# Patient Record
Sex: Female | Born: 1935
Health system: Southern US, Community
[De-identification: ages and names within clinical notes are randomized; demographics above are authoritative.]

## PROBLEM LIST (undated history)

## (undated) DIAGNOSIS — G4733 Obstructive sleep apnea (adult) (pediatric): Secondary | ICD-10-CM

## (undated) DIAGNOSIS — Z87442 Personal history of urinary calculi: Secondary | ICD-10-CM

## (undated) DIAGNOSIS — M797 Fibromyalgia: Secondary | ICD-10-CM

## (undated) DIAGNOSIS — R51 Headache: Secondary | ICD-10-CM

## (undated) DIAGNOSIS — I350 Nonrheumatic aortic (valve) stenosis: Secondary | ICD-10-CM

## (undated) DIAGNOSIS — K649 Unspecified hemorrhoids: Secondary | ICD-10-CM

## (undated) DIAGNOSIS — G8929 Other chronic pain: Secondary | ICD-10-CM

## (undated) DIAGNOSIS — Z952 Presence of prosthetic heart valve: Secondary | ICD-10-CM

## (undated) DIAGNOSIS — M4800 Spinal stenosis, site unspecified: Secondary | ICD-10-CM

## (undated) DIAGNOSIS — M810 Age-related osteoporosis without current pathological fracture: Secondary | ICD-10-CM

## (undated) DIAGNOSIS — E785 Hyperlipidemia, unspecified: Secondary | ICD-10-CM

## (undated) DIAGNOSIS — K219 Gastro-esophageal reflux disease without esophagitis: Secondary | ICD-10-CM

## (undated) DIAGNOSIS — I10 Essential (primary) hypertension: Secondary | ICD-10-CM

## (undated) DIAGNOSIS — R519 Headache, unspecified: Secondary | ICD-10-CM

## (undated) HISTORY — DX: Unspecified hemorrhoids: K64.9

## (undated) HISTORY — DX: Other chronic pain: G89.29

## (undated) HISTORY — DX: Personal history of urinary calculi: Z87.442

## (undated) HISTORY — DX: Age-related osteoporosis without current pathological fracture: M81.0

## (undated) HISTORY — DX: Essential (primary) hypertension: I10

## (undated) HISTORY — DX: Gastro-esophageal reflux disease without esophagitis: K21.9

## (undated) HISTORY — DX: Headache: R51

## (undated) HISTORY — PX: CARDIAC CATHETERIZATION: SHX172

## (undated) HISTORY — DX: Fibromyalgia: M79.7

## (undated) HISTORY — PX: THYROIDECTOMY: SHX17

## (undated) HISTORY — PX: TONSILLECTOMY AND ADENOIDECTOMY: SUR1326

## (undated) HISTORY — DX: Hyperlipidemia, unspecified: E78.5

## (undated) HISTORY — DX: Headache, unspecified: R51.9

## (undated) HISTORY — PX: APPENDECTOMY: SHX54

## (undated) HISTORY — PX: TRANSTHORACIC ECHOCARDIOGRAM: SHX275

## (undated) HISTORY — PX: EYE SURGERY: SHX253

## (undated) HISTORY — PX: CATARACT EXTRACTION: SUR2

---

## 1988-04-13 HISTORY — PX: LAPAROSCOPIC CHOLECYSTECTOMY: SUR755

## 1998-08-23 ENCOUNTER — Other Ambulatory Visit: Admission: RE | Admit: 1998-08-23 | Discharge: 1998-08-23 | Payer: Self-pay | Admitting: Family Medicine

## 1999-11-11 ENCOUNTER — Ambulatory Visit (HOSPITAL_COMMUNITY): Admission: RE | Admit: 1999-11-11 | Discharge: 1999-11-12 | Payer: Self-pay | Admitting: Neurosurgery

## 1999-11-11 ENCOUNTER — Encounter: Payer: Self-pay | Admitting: Neurosurgery

## 1999-11-11 HISTORY — PX: ANTERIOR CERVICAL DECOMP/DISCECTOMY FUSION: SHX1161

## 1999-12-30 ENCOUNTER — Encounter: Payer: Self-pay | Admitting: Neurosurgery

## 1999-12-30 ENCOUNTER — Ambulatory Visit (HOSPITAL_COMMUNITY): Admission: RE | Admit: 1999-12-30 | Discharge: 1999-12-30 | Payer: Self-pay | Admitting: Neurosurgery

## 2000-11-02 ENCOUNTER — Other Ambulatory Visit: Admission: RE | Admit: 2000-11-02 | Discharge: 2000-11-02 | Payer: Self-pay | Admitting: Family Medicine

## 2001-01-25 ENCOUNTER — Encounter: Payer: Self-pay | Admitting: Obstetrics and Gynecology

## 2001-01-31 ENCOUNTER — Inpatient Hospital Stay (HOSPITAL_COMMUNITY): Admission: RE | Admit: 2001-01-31 | Discharge: 2001-02-02 | Payer: Self-pay | Admitting: Obstetrics and Gynecology

## 2001-01-31 ENCOUNTER — Encounter (INDEPENDENT_AMBULATORY_CARE_PROVIDER_SITE_OTHER): Payer: Self-pay | Admitting: Specialist

## 2001-01-31 HISTORY — PX: VAGINAL HYSTERECTOMY: SUR661

## 2002-01-27 ENCOUNTER — Encounter: Payer: Self-pay | Admitting: Internal Medicine

## 2002-04-19 ENCOUNTER — Encounter: Payer: Self-pay | Admitting: Family Medicine

## 2002-04-19 ENCOUNTER — Encounter: Admission: RE | Admit: 2002-04-19 | Discharge: 2002-04-19 | Payer: Self-pay | Admitting: Family Medicine

## 2002-09-06 ENCOUNTER — Other Ambulatory Visit: Admission: RE | Admit: 2002-09-06 | Discharge: 2002-09-06 | Payer: Self-pay | Admitting: Family Medicine

## 2002-09-20 ENCOUNTER — Observation Stay (HOSPITAL_COMMUNITY): Admission: RE | Admit: 2002-09-20 | Discharge: 2002-09-21 | Payer: Self-pay | Admitting: General Surgery

## 2003-01-11 ENCOUNTER — Encounter: Admission: RE | Admit: 2003-01-11 | Discharge: 2003-01-11 | Payer: Self-pay | Admitting: Family Medicine

## 2003-01-11 ENCOUNTER — Encounter: Payer: Self-pay | Admitting: Family Medicine

## 2003-05-13 ENCOUNTER — Ambulatory Visit (HOSPITAL_BASED_OUTPATIENT_CLINIC_OR_DEPARTMENT_OTHER): Admission: RE | Admit: 2003-05-13 | Discharge: 2003-05-13 | Payer: Self-pay | Admitting: *Deleted

## 2004-01-09 ENCOUNTER — Encounter: Admission: RE | Admit: 2004-01-09 | Discharge: 2004-01-09 | Payer: Self-pay | Admitting: Family Medicine

## 2004-04-28 ENCOUNTER — Ambulatory Visit (HOSPITAL_COMMUNITY): Admission: RE | Admit: 2004-04-28 | Discharge: 2004-04-28 | Payer: Self-pay | Admitting: Neurosurgery

## 2004-05-06 ENCOUNTER — Encounter: Admission: RE | Admit: 2004-05-06 | Discharge: 2004-08-04 | Payer: Self-pay | Admitting: Neurosurgery

## 2004-07-07 ENCOUNTER — Encounter
Admission: RE | Admit: 2004-07-07 | Discharge: 2004-10-05 | Payer: Self-pay | Admitting: Physical Medicine and Rehabilitation

## 2004-07-09 ENCOUNTER — Ambulatory Visit: Payer: Self-pay | Admitting: Physical Medicine and Rehabilitation

## 2004-07-24 ENCOUNTER — Ambulatory Visit: Payer: Self-pay | Admitting: Physical Medicine & Rehabilitation

## 2004-08-05 ENCOUNTER — Encounter: Admission: RE | Admit: 2004-08-05 | Discharge: 2004-08-14 | Payer: Self-pay | Admitting: Neurosurgery

## 2004-09-11 HISTORY — PX: UMBILICAL HERNIA REPAIR: SHX196

## 2004-09-25 ENCOUNTER — Ambulatory Visit: Payer: Self-pay | Admitting: Physical Medicine and Rehabilitation

## 2004-10-22 ENCOUNTER — Encounter
Admission: RE | Admit: 2004-10-22 | Discharge: 2005-01-20 | Payer: Self-pay | Admitting: Physical Medicine and Rehabilitation

## 2005-01-07 ENCOUNTER — Encounter: Admission: RE | Admit: 2005-01-07 | Discharge: 2005-01-07 | Payer: Self-pay | Admitting: Family Medicine

## 2005-06-25 ENCOUNTER — Other Ambulatory Visit: Admission: RE | Admit: 2005-06-25 | Discharge: 2005-06-25 | Payer: Self-pay | Admitting: Obstetrics and Gynecology

## 2006-04-20 ENCOUNTER — Encounter: Admission: RE | Admit: 2006-04-20 | Discharge: 2006-04-20 | Payer: Self-pay | Admitting: Family Medicine

## 2006-04-28 ENCOUNTER — Encounter: Admission: RE | Admit: 2006-04-28 | Discharge: 2006-04-28 | Payer: Self-pay | Admitting: Family Medicine

## 2006-10-08 ENCOUNTER — Ambulatory Visit: Payer: Self-pay | Admitting: Cardiovascular Disease

## 2006-10-08 ENCOUNTER — Inpatient Hospital Stay (HOSPITAL_COMMUNITY): Admission: EM | Admit: 2006-10-08 | Discharge: 2006-10-09 | Payer: Self-pay | Admitting: Emergency Medicine

## 2006-11-23 ENCOUNTER — Ambulatory Visit: Payer: Self-pay | Admitting: Cardiovascular Disease

## 2007-02-08 ENCOUNTER — Ambulatory Visit: Payer: Self-pay | Admitting: Internal Medicine

## 2007-02-17 ENCOUNTER — Encounter: Payer: Self-pay | Admitting: Internal Medicine

## 2007-02-17 ENCOUNTER — Ambulatory Visit: Payer: Self-pay | Admitting: Internal Medicine

## 2007-05-03 ENCOUNTER — Encounter: Admission: RE | Admit: 2007-05-03 | Discharge: 2007-05-03 | Payer: Self-pay | Admitting: Family Medicine

## 2007-05-24 ENCOUNTER — Ambulatory Visit: Payer: Self-pay

## 2007-05-24 ENCOUNTER — Encounter: Payer: Self-pay | Admitting: Cardiovascular Disease

## 2007-05-30 ENCOUNTER — Ambulatory Visit: Payer: Self-pay | Admitting: Cardiovascular Disease

## 2007-06-17 ENCOUNTER — Ambulatory Visit (HOSPITAL_COMMUNITY): Admission: RE | Admit: 2007-06-17 | Discharge: 2007-06-17 | Payer: Self-pay | Admitting: Family Medicine

## 2007-06-24 ENCOUNTER — Ambulatory Visit (HOSPITAL_COMMUNITY): Admission: RE | Admit: 2007-06-24 | Discharge: 2007-06-24 | Payer: Self-pay | Admitting: Family Medicine

## 2007-10-19 ENCOUNTER — Ambulatory Visit (HOSPITAL_COMMUNITY): Admission: RE | Admit: 2007-10-19 | Discharge: 2007-10-19 | Payer: Self-pay | Admitting: Family Medicine

## 2007-11-23 ENCOUNTER — Encounter: Admission: RE | Admit: 2007-11-23 | Discharge: 2007-11-23 | Payer: Self-pay | Admitting: Neurosurgery

## 2007-12-13 ENCOUNTER — Encounter: Admission: RE | Admit: 2007-12-13 | Discharge: 2007-12-13 | Payer: Self-pay | Admitting: Neurosurgery

## 2007-12-22 ENCOUNTER — Ambulatory Visit: Payer: Self-pay | Admitting: Cardiovascular Disease

## 2008-01-03 ENCOUNTER — Ambulatory Visit (HOSPITAL_COMMUNITY): Admission: RE | Admit: 2008-01-03 | Discharge: 2008-01-03 | Payer: Self-pay | Admitting: Neurosurgery

## 2008-02-14 ENCOUNTER — Ambulatory Visit (HOSPITAL_COMMUNITY): Admission: RE | Admit: 2008-02-14 | Discharge: 2008-02-14 | Payer: Self-pay | Admitting: Family Medicine

## 2008-02-14 ENCOUNTER — Encounter (INDEPENDENT_AMBULATORY_CARE_PROVIDER_SITE_OTHER): Payer: Self-pay | Admitting: *Deleted

## 2008-08-23 ENCOUNTER — Encounter: Admission: RE | Admit: 2008-08-23 | Discharge: 2008-08-23 | Payer: Self-pay | Admitting: Family Medicine

## 2008-08-28 DIAGNOSIS — R079 Chest pain, unspecified: Secondary | ICD-10-CM | POA: Insufficient documentation

## 2008-08-28 DIAGNOSIS — K219 Gastro-esophageal reflux disease without esophagitis: Secondary | ICD-10-CM

## 2008-08-28 DIAGNOSIS — I10 Essential (primary) hypertension: Secondary | ICD-10-CM | POA: Insufficient documentation

## 2008-08-28 DIAGNOSIS — R002 Palpitations: Secondary | ICD-10-CM | POA: Insufficient documentation

## 2008-08-28 DIAGNOSIS — E7800 Pure hypercholesterolemia, unspecified: Secondary | ICD-10-CM | POA: Insufficient documentation

## 2008-08-28 DIAGNOSIS — I359 Nonrheumatic aortic valve disorder, unspecified: Secondary | ICD-10-CM | POA: Insufficient documentation

## 2008-08-29 ENCOUNTER — Ambulatory Visit: Payer: Self-pay | Admitting: Cardiovascular Disease

## 2009-02-26 ENCOUNTER — Ambulatory Visit: Payer: Self-pay | Admitting: Cardiovascular Disease

## 2009-02-26 ENCOUNTER — Encounter: Payer: Self-pay | Admitting: Cardiovascular Disease

## 2009-02-26 ENCOUNTER — Ambulatory Visit (HOSPITAL_COMMUNITY): Admission: RE | Admit: 2009-02-26 | Discharge: 2009-02-26 | Payer: Self-pay | Admitting: Cardiovascular Disease

## 2009-12-10 ENCOUNTER — Encounter: Admission: RE | Admit: 2009-12-10 | Discharge: 2009-12-10 | Payer: Self-pay | Admitting: Family Medicine

## 2009-12-25 ENCOUNTER — Telehealth (INDEPENDENT_AMBULATORY_CARE_PROVIDER_SITE_OTHER): Payer: Self-pay | Admitting: *Deleted

## 2010-01-10 ENCOUNTER — Telehealth: Payer: Self-pay | Admitting: Internal Medicine

## 2010-01-13 ENCOUNTER — Ambulatory Visit: Payer: Self-pay | Admitting: Internal Medicine

## 2010-01-13 DIAGNOSIS — Z860100 Personal history of colon polyps, unspecified: Secondary | ICD-10-CM | POA: Insufficient documentation

## 2010-01-13 DIAGNOSIS — D649 Anemia, unspecified: Secondary | ICD-10-CM

## 2010-01-13 DIAGNOSIS — K5900 Constipation, unspecified: Secondary | ICD-10-CM | POA: Insufficient documentation

## 2010-01-13 DIAGNOSIS — K5730 Diverticulosis of large intestine without perforation or abscess without bleeding: Secondary | ICD-10-CM | POA: Insufficient documentation

## 2010-01-13 DIAGNOSIS — K648 Other hemorrhoids: Secondary | ICD-10-CM | POA: Insufficient documentation

## 2010-01-13 DIAGNOSIS — K625 Hemorrhage of anus and rectum: Secondary | ICD-10-CM | POA: Insufficient documentation

## 2010-01-13 DIAGNOSIS — K644 Residual hemorrhoidal skin tags: Secondary | ICD-10-CM | POA: Insufficient documentation

## 2010-01-13 DIAGNOSIS — IMO0001 Reserved for inherently not codable concepts without codable children: Secondary | ICD-10-CM | POA: Insufficient documentation

## 2010-01-13 DIAGNOSIS — E785 Hyperlipidemia, unspecified: Secondary | ICD-10-CM | POA: Insufficient documentation

## 2010-01-13 LAB — CONVERTED CEMR LAB
Basophils Absolute: 0 10*3/uL (ref 0.0–0.1)
Basophils Relative: 0.4 % (ref 0.0–3.0)
Eosinophils Absolute: 0.2 10*3/uL (ref 0.0–0.7)
Eosinophils Relative: 1.9 % (ref 0.0–5.0)
HCT: 40.3 % (ref 36.0–46.0)
Hemoglobin: 13.8 g/dL (ref 12.0–15.0)
Lymphs Abs: 2.6 10*3/uL (ref 0.7–4.0)
MCHC: 34.3 g/dL (ref 30.0–36.0)
MCV: 92.5 fL (ref 78.0–100.0)
Monocytes Absolute: 0.8 10*3/uL (ref 0.1–1.0)
Neutro Abs: 5.8 10*3/uL (ref 1.4–7.7)
WBC: 9.4 10*3/uL (ref 4.5–10.5)

## 2010-01-14 ENCOUNTER — Telehealth (INDEPENDENT_AMBULATORY_CARE_PROVIDER_SITE_OTHER): Payer: Self-pay | Admitting: *Deleted

## 2010-01-15 ENCOUNTER — Ambulatory Visit: Payer: Self-pay | Admitting: Internal Medicine

## 2010-01-16 ENCOUNTER — Encounter: Payer: Self-pay | Admitting: Internal Medicine

## 2010-02-24 ENCOUNTER — Ambulatory Visit: Payer: Self-pay | Admitting: Cardiovascular Disease

## 2010-04-13 HISTORY — PX: CATARACT EXTRACTION W/ INTRAOCULAR LENS  IMPLANT, BILATERAL: SHX1307

## 2010-05-04 ENCOUNTER — Encounter: Payer: Self-pay | Admitting: Family Medicine

## 2010-05-05 ENCOUNTER — Encounter: Payer: Self-pay | Admitting: Family Medicine

## 2010-05-13 NOTE — Letter (Signed)
Summary: Patient Notice- Polyp Results  [facility]  [address] [place], [place] [address]   Phone: [phone]  Fax: [phone]        January 16, 2010 MRN: [id number]    [name] [address] [place], [place]  [address]    Dear Ms. Hantz,  The polyp removed from your colon was adenomatous. This means that it was pre-cancerous or that  it had the potential to change into cancer over time.  I recommend that if your health status allows, you have a repeat colonoscopy in 5 years to determine if you have developed any new polyps over time and screen for colon cancer. If you develop any new rectal bleeding, abdominal pain or significant bowel habit changes, please contact us before then.   Please call us if you are having persistent problems or have questions about your condition that have not been fully answered at this time.  Sincerely,  Iva Boop MD, [facility]  This letter has been electronically signed by your physician.  Appended Document: Patient Notice- Polyp Results letter mailed

## 2010-05-13 NOTE — Progress Notes (Signed)
  Walk in Patient Form Recieved " Pt has Question's and Concerns" sent to Message Nurse [facility]  December 25, 2009 12:34 PM

## 2010-05-13 NOTE — Procedures (Signed)
Summary: Colonoscopy   Colonoscopy  Procedure date:  01/27/2002  Findings:      Location:  [facility].     Patient Name: [name], [name] MRN:  Procedure Procedures: Colonoscopy CPT: [phone].    with polypectomy. CPT: A3573898.  Personnel: Endoscopist: Iva Boop, MD, [facility].  Exam Location: Exam performed in Outpatient Clinic. Outpatient  Patient Consent: Procedure, Alternatives, Risks and Benefits discussed, consent obtained, from patient. Consent was obtained by the RN.  Indications Symptoms: Constipation  Average Risk Screening Routine.  History  Pre-Exam Physical: Performed Jan 27, 2002. Cardio-pulmonary exam abnormal. Rectal exam, HEENT exam , Mental status exam WNL. Abnormal PE findings include: 2/6 systolic murmur.  Exam Exam: Extent of exam reached: Cecum, extent intended: Cecum.  The cecum was identified by appendiceal orifice and IC valve. Patient position: on left side. Colon retroflexion performed. Images taken. ASA Classification: I. Tolerance: good.  Monitoring: Pulse and BP monitoring, Oximetry used. Supplemental O2 given.  Colon Prep Used Golytely for colon prep. Prep results: good.  Sedation Meds: Patient assessed and found to be appropriate for moderate (conscious) sedation. Fentanyl 25 mcg. given IV. Versed 6 mg. given IV.  Comments: Irritable bowel response to scope passage. Findings - NORMAL EXAM: Ascending Colon to Descending Colon.  POLYP: Ascending Colon, Maximum size: 4 mm. diminutive, sessile polyp. Procedure:  snare without cautery, removed, retrieved, Polyp sent to pathology. ICD9: Colon Polyps: 211.3.  NORMAL EXAM: Cecum.  DIVERTICULOSIS: Sigmoid Colon. Not bleeding. ICD9: Diverticulosis, Colon: 562.10.  HEMORRHOIDS: External. Size: Grade I. Not bleeding. Not thrombosed. ICD9: Hemorrhoids, External: 455.3.    Comments: No other abnormalities except those described above. Assessment Abnormal examination, see  findings above.  Diagnoses: 211.3: Colon Polyps.  562.10: Diverticulosis, Colon.  455.3: Hemorrhoids, External.   Events  Unplanned Interventions: No intervention was required.  Plans Medication Plan: Await pathology. Continue current medications.  Disposition: After procedure patient sent to recovery. After recovery patient sent home.  Scheduling/Referral: Await pathology to schedule patient.  Comments: If polyp is precancerous (adenomatous) will repeat colonoscopy in 5 years. If hyperplastic, revert to normal screeninf (colonoscopy in 10 years, yearly hemoccults starting in 5 years).   CC: [name]  This report was created from the original endoscopy report, which was reviewed and signed by the above listed endoscopist.

## 2010-05-13 NOTE — Procedures (Signed)
Summary: Endoscopy   EGD  Procedure date:  01/27/2002  Findings:      Location: [facility]     Patient Name: [name], [name] MRN:  Procedure Procedures: Panendoscopy (EGD) CPT: 43235.  Personnel: Endoscopist: Iva Boop, MD, [facility].  Exam Location: Exam performed in Outpatient Clinic. Outpatient  Patient Consent: Procedure, Alternatives, Risks and Benefits discussed, consent obtained, from patient. Consent was obtained by the RN.  Indications Symptoms: Abdominal pain, location: RUQ. Reflux symptoms  Comments: Epigastric pain also. History  Pre-Exam Physical: Performed Jan 27, 2002  Cardio-pulmonary exam abnormal. HEENT exam, Mental status exam WNL. Abnormal PE findings include: 2/6 systolic murmur.  Exam Exam Info: Maximum depth of insertion Duodenum, intended Duodenum. Vocal cords visualized. Gastric retroflexion performed. ASA Classification: I. Tolerance: good.  Sedation Meds: Patient assessed and found to be appropriate for moderate (conscious) sedation. Fentanyl 75 mcg. given IV. Versed 6 mg. given IV. Cetacaine Spray 2 sprays given aerosolized.  Monitoring: BP and pulse monitoring done. Oximetry used. Supplemental O2 given  Fluoroscopy: Fluoroscopy was not used.  Findings - Normal: Proximal Esophagus to Body.  - Normal: Duodenal Bulb to Duodenal 2nd Portion.  MUCOSAL ABNORMALITY: Antrum. Mottled mucosa. Biopsy/Mucosal Abn taken. ICD9: Gastritis, Unspecified: 535.50.   Assessment Abnormal examination, see findings above.  Diagnoses: 535.50: Gastritis, Unspecified.   Events  Unplanned Intervention: No unplanned interventions were required.  Plans Medication(s): Await pathology.  Comments: Increase Prilosec to 20 mg/day (OTC). Further recommendations pending biopsy, ? needs anticholinergic. Disposition: After procedure patient sent to recovery. After recovery patient sent home.  Scheduling: Await pathology to schedule patient.     CC: [name]  This report was created from the original endoscopy report, which was reviewed and signed by the above listed endoscopist.

## 2010-05-13 NOTE — Letter (Signed)
Summary: [facility] Instructions  [facility]  [address] [place], [place] [address]   Phone: [phone]  Fax: [phone]       [name]    [birth year]    MRN: [id number]        Procedure Day /Date:01-15-10     Arrival Time:10:30 AM      Procedure Time: 11:30 AM     Location of Procedure:                    X    [facility] (4th Floor)  PREPARATION FOR COLONOSCOPY WITH MOVIPREP   Today  do not eat nuts, seeds, popcorn, corn, beans, peas,  salads, or any raw vegetables.  Do not take any fiber supplements (e.g. Metamucil, Citrucel, and Benefiber).  THE DAY BEFORE YOUR PROCEDURE         DATE: 01-14-10  DAY: Tuesday  1.  Drink clear liquids the entire day-NO SOLID FOOD  2.  Do not drink anything colored red or purple.  Avoid juices with pulp.  No orange juice.  3.  Drink at least 64 oz. (8 glasses) of fluid/clear liquids during the day to prevent dehydration and help the prep work efficiently.  CLEAR LIQUIDS INCLUDE: Water Jello Ice Popsicles Tea (sugar ok, no milk/cream) Powdered fruit flavored drinks Coffee (sugar ok, no milk/cream) Gatorade Juice: apple, white grape, white cranberry  Lemonade Clear bullion, consomm, broth Carbonated beverages (any kind) Strained chicken noodle soup Hard Candy                             4.  In the morning, mix first dose of MoviPrep solution:    Empty 1 Pouch A and 1 Pouch B into the disposable container    Add lukewarm drinking water to the top line of the container. Mix to dissolve    Refrigerate (mixed solution should be used within 24 hrs)  5.  Begin drinking the prep at 5:00 p.m. The MoviPrep container is divided by 4 marks.   Every 15 minutes drink the solution down to the next mark (approximately 8 oz) until the full liter is complete.   6.  Follow completed prep with 16 oz of clear liquid of your choice (Nothing red or purple).  Continue to drink clear liquids until bedtime.  7.  Before  going to bed, mix second dose of MoviPrep solution:    Empty 1 Pouch A and 1 Pouch B into the disposable container    Add lukewarm drinking water to the top line of the container. Mix to dissolve    Refrigerate  THE DAY OF YOUR PROCEDURE      DATE: 01-15-10 DAY: Wednesday  Beginning at 6:30 Am (5 hours before procedure):         1. Every 15 minutes, drink the solution down to the next mark (approx 8 oz) until the full liter is complete.  2. Follow completed prep with 16 oz. of clear liquid of your choice.    3. You may drink clear liquids until 9:30 AM (2 HOURS BEFORE PROCEDURE).   MEDICATION INSTRUCTIONS  Unless otherwise instructed, you should take regular prescription medications with a small sip of water   as early as possible the morning of your procedure.      OTHER INSTRUCTIONS  You will need a responsible adult at least 73 years of age to accompany you and drive you home.  This person must remain in the waiting room during your procedure.  Wear loose fitting clothing that is easily removed.  Leave jewelry and other valuables at home.  However, you may wish to bring a book to read or  an iPod/MP3 player to listen to music as you wait for your procedure to start.  Remove all body piercing jewelry and leave at home.  Total time from sign-in until discharge is approximately 2-3 hours.  You should go home directly after your procedure and rest.  You can resume normal activities the  day after your procedure.  The day of your procedure you should not:   Drive   Make legal decisions   Operate machinery   Drink alcohol   Return to work  You will receive specific instructions about eating, activities and medications before you leave.    The above instructions have been reviewed and explained to me by   _______________________    I fully understand and can verbalize these instructions _____________________________ Date _________

## 2010-05-13 NOTE — Procedures (Signed)
Summary: Colonoscopy  Patient: [name] Note: All result statuses are Final unless otherwise noted.  Tests: (1) Colonoscopy (COL)   COL Colonoscopy           DONE     [facility]     [address]     [place], [place]  [address]           COLONOSCOPY PROCEDURE REPORT           PATIENT:  [name], [name]  MR#:  [id number]     BIRTHDATE:  17-Feb-1936, 74 yrs. old  GENDER:  female     ENDOSCOPIST:  Iva Boop, MD, [facility]           PROCEDURE DATE:  01/15/2010     PROCEDURE:  Colonoscopy with biopsy     ASA CLASS:  Class II     INDICATIONS:  rectal bleeding, history of pre-cancerous     (adenomatous) colon polyps diminutive adenoma 2008     MEDICATIONS:   Fentanyl 75 mcg IV, Versed 8 mg IV           DESCRIPTION OF PROCEDURE:   After the risks benefits and     alternatives of the procedure were thoroughly explained, informed     consent was obtained.  Digital rectal exam was performed and     revealed no abnormalities.   The LB [id number] endoscope     was introduced through the anus and advanced to the cecum, which     was identified by both the appendix and ileocecal valve, without     limitations.  The quality of the prep was excellent, using     MoviPrep.  The instrument was then slowly withdrawn as the colon     was fully examined.     <<PROCEDUREIMAGES>>           FINDINGS:  Strong IBS response to scope insertion. A diminutive     polyp was found in the cecum. It was 2 mm in size. The polyp was     removed using cold biopsy forceps.  Moderate diverticulosis was     found in the sigmoid colon.  Melanosis coli was found throughout     the colon.  This was otherwise a normal examination of the colon.     Retroflexed views in the rectum revealed internal hemorrhoids.     The scope was then withdrawn from the patient and the procedure     completed.           COMPLICATIONS:  None     ENDOSCOPIC IMPRESSION:     1) 2 mm diminutive polyp - removed     2) Moderate  diverticulosis in the sigmoid colon     3) Melanosis throughout the colon     4) Otherwise normal examination     5) Internal hemorrhoids     6) Prior diminutive adenoma 2008     RECOMMENDATIONS:     Surgical consult re: hemorrhoids.     Note that repeat Hgb 13 NORMAL     She should try daily MiraLax for constipation     consider deep sedation in future due to strong IBS response to     scope though appears to have had appropriate amnestic response to     Versed.     REPEAT EXAM:  In for Colonoscopy, pending biopsy results.           Iva Boop, MD, [facility]  CC:  Rudi Heap, MD     The Patient           n.     eSIGNED:   Iva Boop at 01/15/2010 01:25 PM           [name], [id number]  Note: An exclamation mark (!) indicates a result that was not dispersed into the flowsheet. Document Creation Date: 01/15/2010 1:25 PM _______________________________________________________________________  (1) Order result status: Final Collection or observation date-time: 01/15/2010 13:18 Requested date-time:  Receipt date-time:  Reported date-time:  Referring Physician:   Ordering Physician: Stan Head [phone]) Specimen Source:  Source: Launa Grill Order Number: [id number] Lab site:   Appended Document: Colonoscopy     Procedures Next Due Date:    Colonoscopy: 01/2015

## 2010-05-13 NOTE — Progress Notes (Signed)
Summary: triage  Phone Note From Other Clinic Call back at [phone]   Caller: Eunice Blase, ref coor Call For: Dr. Leone Payor Reason for Call: Schedule Patient Appt Summary of Call: Dr. Rudi Heap would like pt to be seen for rectal bleeding... Debbie said next NP3 was too long of a wait, November Initial call taken by: Vallarie Mare,  January 10, 2010 10:53 AM  Follow-up for Phone Call        Message left for Debbie: Pt. will see Amy Esterwood PAC on 01-13-10 at 2:30pm, please advise pt. of [email or web address]. Debbie to fax records to [facility].  Follow-up by: Laureen Ochs LPN,  January 10, 2010 11:31 AM

## 2010-05-13 NOTE — Procedures (Signed)
Summary: Colonoscopy   Colonoscopy  Procedure date:  02/17/2007  Findings:      Location:  [facility].    Patient Name: [name], [name] MRN:  Procedure Procedures: Colonoscopy CPT: [phone].    with polypectomy. CPT: [id number].  Personnel: Endoscopist: Iva Boop, MD, [facility].  Exam Location: Exam performed in Outpatient Clinic. Outpatient  Patient Consent: Procedure, Alternatives, Risks and Benefits discussed, consent obtained, from patient. Consent was obtained by the RN.  Indications  Surveillance of: Adenomatous Polyp(s). This is an initial surveillance exam. Initial polypectomy was performed in 2003. in Oct. 1-2 Polyps were found at Index Exam. Largest polyp removed was 1 to 5 mm. Prior polyp located in proximal (splenic flexure and beyond) colon. Pathology of worst  polyp: tubular adenoma.  History  Current Medications: Patient is not currently taking Coumadin.  Allergies: No known allergies.  Comments: Here for polyp f/u. Having constipation and hemorrhoid trouble despite Metamucil Pre-Exam Physical: Performed Feb 17, 2007. Cardio-pulmonary exam, Rectal exam, HEENT exam , Mental status exam WNL.  Comments: Pt. history reviewed/updated, physical exam performed prior to initiation of sedation?  YES Exam Exam: Extent of exam reached: Cecum, extent intended: Cecum.  The cecum was identified by appendiceal orifice and IC valve. Patient position: on left side. Time to Cecum: 00:03:56. Time for Withdrawl: 00:10:44. Colon retroflexion performed. Images taken. ASA Classification: II. Tolerance: good.  Monitoring: Pulse and BP monitoring, Oximetry used. Supplemental O2 given.  Colon Prep Used MiraLax for colon prep. Prep results: excellent.  Sedation Meds: Patient assessed and found to be appropriate for moderate (conscious) sedation. Fentanyl 75 mcg. given IV. Versed 6 mg. given IV.  Comments: FAIRLY STRONG IBS RESPONSE IN SIGMOID Findings POLYP:  Transverse Colon, Maximum size: 4 mm. diminutive, sessile polyp. Procedure:  snare without cautery, removed, retrieved, Polyp sent to pathology.  - NORMAL EXAM: Transverse Colon to Descending Colon.  - DIVERTICULOSIS: Descending Colon to Sigmoid Colon. Comments: SEVERE.  - NORMAL EXAM: Cecum to Hepatic Flexure.  HEMORRHOIDS: Internal and External. Size: Grade II.   Assessment  Comments: 1) 4 MM POLYP REMOVED 2) DIVERTICULOSIS 3) INT/EXT HEMORRHOIDS 4) EXCELLENT PREP Events  Unplanned Interventions: No intervention was required.  Plans Patient Education: Patient given standard instructions for: Polyps. Diverticulosis. Hemorrhoids.  Comments: TRY MIRALAX EACH DAY FOR CONSTIPATION, STOP METAMUCIL. ADJUST DOSE OF MIRALAX FOR EFFECT Disposition: After procedure patient sent to recovery. After recovery patient sent home.  Scheduling/Referral: Colonoscopy, to Iva Boop, MD, [facility], 5 YRS,  Path Letter,    CC: Riki Sheer     The Patient  This report was created from the original endoscopy report, which was reviewed and signed by the above listed endoscopist.

## 2010-05-13 NOTE — Assessment & Plan Note (Signed)
Summary: RECTAL BLEEDING          (DR.GESSNER PT.)       [name]   History of Present Illness Visit Type: Initial Consult Primary GI MD: Stan Head MD [facility] Primary Provider: Rudi Heap, MD Requesting Provider: Rudi Heap, MD Chief Complaint: rectal bleeding on and off x 3 years  bright red in color.   History of Present Illness:   [name] KNOWN TO DR. Leone Payor. SHE HAD COLONOSCOPY LAST IN11/08-WITH DIVERTICULOSIS,INT/EXT HEMORRHOIDS, AND ONE POLYP(TUBULAR ADENOMA).  SHE IS REFERRED TODAY WITH ANEMIA, AND INTERMITTENT RECTAL BLEEDING. SHE  SAYS SHE HAS HAD INTERMITTENT BLEEDING FOR THE PAST COUPLE YEARS,USUALLY MORE NOTICEABLE IF SHE IS CONSTIAPTED OR AFTER SHE HAS TAKEN A LAXATIVE. SHE WILL SEE A SMALL AMT OR BRB IN THE COMMODE. SHE HAS INTERMITTENT HEMORRHOID SXS WITH BURNING DISCOMFORT.SHE HAS NOTED SOME MILD LOWER ABDOMINAL CRAMPY DISCOMFORT OVER THE PAST 8 MONTHS.HAS CHRONIC CONSTIPATION.  APPETITE FINE,WEIGHT STABLE, NO HEARTBURN,INDIGESTION,OR DYSPHAGIA. SHE HAS HAD A RECENT GYN EVAL WITH NO SOURCE FOR BLEEDING FOUND. HGB ON 9/28 REPORTED AT 11.0.   GI Review of Systems      Denies abdominal pain, acid reflux, belching, bloating, chest pain, dysphagia with liquids, dysphagia with solids, heartburn, loss of appetite, nausea, vomiting, vomiting blood, weight loss, and  weight gain.      Reports constipation, hemorrhoids, rectal bleeding, and  rectal pain.     Denies anal fissure, black tarry stools, change in bowel habit, diarrhea, diverticulosis, fecal incontinence, heme positive stool, irritable bowel syndrome, jaundice, light color stool, and  liver problems.    Current Medications (verified): 1)  Toprol Xl 100 Mg Xr24h-Tab (Metoprolol Succinate) .Marland Kitchen.. 1 Tab By Mouth Once Daily 2)  Omeprazole 20 Mg Tbec (Omeprazole) .Marland Kitchen.. 1 Tab By Mouth Once Daily 3)  Dulcolax 5 Mg Tbec (Bisacodyl) .... As Needed 4)  Simvastatin 40 Mg Tabs (Simvastatin) .... Take 1 Tablet By  Mouth Once A Day 5)  Aspirin 81 Mg  Tabs (Aspirin) .Marland Kitchen.. 1 Tab By Mouth Once Daily 6)  Vitamin C 1000 Mg Tabs (Ascorbic Acid) .... Tab By Mouth Once Daily 7)  Cvs Vitamin E 1000 Unit Caps (Vitamin E) .... Tab By Mouth Once Daily 8)  Citracal Plus  Tabs (Multiple Minerals-Vitamins) .Marland Kitchen.. 1 Tab By Mouth Once Daily  Allergies (verified): No Known Drug Allergies  Past History:  Past Medical History: CHEST PAIN-UNSPECIFIED (ICD-786.50) GERD (ICD-530.81) AORTIC STENOSIS, MILD (ICD-424.1) HYPERCHOLESTEROLEMIA  IIA (ICD-272.0) PALPITATIONS (ICD-785.1) HYPERTENSION, UNSPECIFIED (ICD-401.9) FIBROMYALGIA ADENOMATOUS COLON  POLYP 11/08 DIVERTICULOSIS INT/EXT HEMORRHOIDS    Past Surgical History: Reviewed history from 08/28/2008 and no changes required. Neck surgery, status post thyroidectomy as a child.  Cholecystectomy.  Appendectomy.  Bladder tack.  T&A.   Family History: Reviewed history from 08/28/2008 and no changes required. Family History of Cancer:  Family History of Diabetes:  No FH of Colon Cancer:  Social History: Reviewed history from 08/28/2008 and no changes required. Married  Tobacco Use - Former. - 2003 Alcohol Use - yes Regular Exercise - no Drug Use - no  Review of Systems       The patient complains of anemia, arthritis/joint pain, fatigue, sleeping problems, and swelling of feet/legs.  The patient denies allergy/sinus, anxiety-new, back pain, blood in urine, breast changes/lumps, change in vision, confusion, cough, coughing up blood, depression-new, fainting, fever, headaches-new, hearing problems, heart murmur, heart rhythm changes, itching, menstrual pain, muscle pains/cramps, night sweats, nosebleeds, pregnancy symptoms, shortness of breath, skin rash, sore  throat, swollen lymph glands, thirst - excessive , urination - excessive , urination changes/pain, urine leakage, vision changes, and voice change.         SEE HPI  Vital Signs:  Patient profile:   75  year old [name] Height:      64 inches Weight:      166 pounds BMI:     28.60 Pulse rate:   80 / minute Pulse rhythm:   regular BP sitting:   134 / 70  (left arm)  Vitals Entered By: Milford Cage NCMA (January 13, 2010 2:39 PM)  Physical Exam  General:  Well developed, well nourished, no acute distress. Head:  Normocephalic and atraumatic. Eyes:  PERRLA, no icterus. Lungs:  Clear throughout to auscultation. Heart:  Regular rate and rhythm; no murmurs, rubs,  or bruits.heart murmur systolic:.   Abdomen:  SOFT, BASICALLY NONTENDER, NO MASS OR HSM, BS+ Rectal:  INFLAMED EXTERNAL HEMORRHOID, HEME POSITIVE STOOL Neurologic:  Alert and  oriented x4;  grossly normal neurologically. Psych:  Alert and cooperative. Normal mood and affect.   Impression & Recommendations:  Problem # 1:  RECTAL BLEEDING (ICD-569.3) Assessment Unchanged 74 YO [name] WITH HX OF ADENOMATOUS COLON POLYPS,DIVERTICULOSIS, AND HEMORRHOIDS WITH INTERMITTENT HEMATOCHEZIA, HEME POSITIVE STOOL, AND ANEMIA.  WHILE HER BLEEDING MAY BE HEMORRHOIDAL,UNUSUAL TO CAUSE ANEMIA-R/O OCCULT COLON LESION.   SCHEDULE FOR FOLLOW UP COLONOSCOPY WITH DR. Benjamine Mola DISCUSSED IN DETAIL WITH PT. START MIRALAX 17 GM DAILY IN WATER FOR CONSTIPATION ANAMANTLE SAMPLES GIVEN AND WILL CALL IN RX FOR LIDAMANTLE  -USE 2-3 X DAILY REVIEWED RECTAL CARE CHECK CBC, AND FE STUDIES TODAY Orders: Colonoscopy (Colon) TLB-Iron, (Fe) Total (83540-FE) TLB-CBC Platelet - w/Differential (85025-CBCD) TLB-IBC Pnl (Iron/FE;Transferrin) (83550-IBC)  Problem # 2:  ANEMIA-UNSPECIFIED (ICD-285.9) Assessment: Comment Only SEE ABOVE  Problem # 3:  CONSTIPATION (ICD-564.00) Assessment: Comment Only SEE ABOVE  Problem # 4:  FIBROMYALGIA (ICD-729.1) Assessment: Comment Only  Problem # 5:  AORTIC STENOSIS, MILD (ICD-424.1) Assessment: Comment Only  Problem # 6:  GERD (ICD-530.81) Assessment: Comment Only STABLE ON OMEPRAZOLE 20MG   DAILY  Patient Instructions: 1)  Please go to lab, basement level. 2)  You can get MIRALAX at your pharmacy , any [facility], [facility]. 3)  Take 17 grams with 8  oz of water or clear juice daily. 4)  We sent prescriptions for Lidamantle cream to [facility] in [place]. 5)  We scheduled the Colonscopy with Dr. Leone Payor on Wednesday 01-15-10. 6)  Directions and brochure provided. 7)  [facility] Patient Information Guide given to patient. 8)  Copy sent to : Rudi Heap, Md 9)  The medication list was reviewed and reconciled.  All changed / newly prescribed medications were explained.  A complete medication list was provided to the patient / caregiver. Prescriptions: MOVIPREP 100 GM  SOLR (PEG-KCL-NACL-NASULF-NA ASC-C) As per prep instructions.  #1 x 0   Entered by:   Lowry Ram NCMA   Authorized by:   Sammuel Cooper PA-c   Signed by:   Lowry Ram NCMA on 01/13/2010   Method used:   Electronically to        [facility] [phone]* (retail)       [address] [place], [place]  [address]       Ph: [phone] or [phone]       Fax: [phone]   RxID:   [id number] LIDAMANTLE HC 3-0.5 %  CREA (LIDOCAINE-HYDROCORTISONE ACE) Use rectally twice daily  #30 cc x 0   Entered by:   Lowry Ram NCMA   Authorized by:   Sammuel Cooper PA-c   Signed by:   Lowry Ram NCMA on 01/13/2010   Method used:   Electronically to        [facility] [phone]* (retail)       [address] [place], [place]  [address]       Ph: [phone] or [phone]       Fax: [phone]   RxID:   [id number]

## 2010-05-13 NOTE — Progress Notes (Signed)
Summary: Prior Auth for [facility] Cream  Phone Note Outgoing Call   Call placed by: Joselyn Glassman,  January 14, 2010 3:45 PM Call placed to: Insurer Summary of Call: Called [phone] and did a Prior authorization for the Lidomantle Cream 3%-0.5.   I informed them the  pt has tried [employer], OTC creams and nothing has been effective.  I told her to continue using the Anamatle cream samples we gave her.  Wayne at the [employer] said they would call the pt with their answer once they know.  They are doing a review with the info I gave them. Initial call taken by: Joselyn Glassman,  January 14, 2010 3:49 PM  Follow-up for Phone Call        Patient states that she has already picked up the lidocaine cream and it is working well for her. Follow-up by: Lamona Curl CMA Duncan Dull),  January 17, 2010 2:56 PM

## 2010-05-13 NOTE — Assessment & Plan Note (Signed)
Summary: f1y/dm   Referring Provider:  Rudi Heap, MD Primary Provider:  Rudi Heap, MD   History of Present Illness: [name] is seen today in followup for chest pain aortic stenosis and hypercholesterolemia.  His been doing well.  She has arthritis and fibromyalgia.  She's been having some atypical sharp pain in the left chest.  It is sporadic it can be weekly or sometimes every 2-3 weeks.  She takes aspirin and feels better.  She had an essentially normal heart  cath in 2008 and a normal Myoview 2009.  Her EKGs have been normal.  Don't think her current chest pains or angina.  She has mild aortic stenosis.  Her mean aortic valve gradient was 8 with a peak gradient of 14 by echo on 210 2009.   I reviewed her echo from 11/10  and the AS is still very mild with no change in gradients.   Her dentition is in good shape.  He is not any  palpitations PND orthopnea his been no syncope lower extremity edema or fever of unknown origin.    She had a carotid duplex at [facility] which was apparantly ok.  I am not sure why this was ordered as she just had a refferred AS murmur to the right side of her neck.  Recentl polyp removal was benign  Echo 11/10 very mild stenosis mean gradient 10 peak 14 mmHg  Current Problems (verified): 1)  Anemia-unspecified  (ICD-285.9) 2)  Constipation  (ICD-564.00) 3)  Fibromyalgia  (ICD-729.1) 4)  Hyperlipidemia  (ICD-272.4) 5)  Diverticulosis-colon  (ICD-562.10) 6)  Personal History of Colonic Polyps  (ICD-V12.72) 7)  Hemorrhoids, Internal  (ICD-455.0) 8)  Hemorrhoids, External  (ICD-455.3) 9)  Rectal Bleeding  (ICD-569.3) 10)  Chest Pain-unspecified  (ICD-786.50) 11)  Gerd  (ICD-530.81) 12)  Aortic Stenosis, Mild  (ICD-424.1) 13)  Hypercholesterolemia Iia  (ICD-272.0) 14)  Palpitations  (ICD-785.1) 15)  Hypertension, Unspecified  (ICD-401.9)  Current Medications (verified): 1)  Toprol Xl 100 Mg Xr24h-Tab (Metoprolol Succinate) .Marland Kitchen.. 1 Tab By Mouth Once  Daily 2)  Omeprazole 20 Mg Tbec (Omeprazole) .Marland Kitchen.. 1 Tab By Mouth Once Daily 3)  Dulcolax 5 Mg Tbec (Bisacodyl) .... As Needed 4)  Simvastatin 40 Mg Tabs (Simvastatin) .... Take 1 Tablet By Mouth Once A Day 5)  Aspirin 81 Mg  Tabs (Aspirin) .Marland Kitchen.. 1 Tab By Mouth Once Daily 6)  Vitamin C 1000 Mg Tabs (Ascorbic Acid) .... Tab By Mouth Once Daily 7)  Cvs Vitamin E 1000 Unit Caps (Vitamin E) .... Tab By Mouth Once Daily 8)  Citracal Plus  Tabs (Multiple Minerals-Vitamins) .Marland Kitchen.. 1 Tab By Mouth Once Daily 9)  Anamantle Hc 3-0.5 % Kit (Lidocaine-Hydrocortisone Ace) .... Use Rectally Twice Daily 10)  Lidamantle Hc 3-0.5 % Crea (Lidocaine-Hydrocortisone Ace) .... Use Rectally Twice Daily  Allergies (verified): No Known Drug Allergies  Past History:  Past Medical History: Last updated: 01/13/2010 CHEST PAIN-UNSPECIFIED (ICD-786.50) GERD (ICD-530.81) AORTIC STENOSIS, MILD (ICD-424.1) HYPERCHOLESTEROLEMIA  IIA (ICD-272.0) PALPITATIONS (ICD-785.1) HYPERTENSION, UNSPECIFIED (ICD-401.9) FIBROMYALGIA ADENOMATOUS COLON  POLYP 11/08 DIVERTICULOSIS INT/EXT HEMORRHOIDS    Past Surgical History: Last updated: 08/28/2008 Neck surgery, status post thyroidectomy as a child.  Cholecystectomy.  Appendectomy.  Bladder tack.  T&A.   Family History: Last updated: 01/13/2010 Family History of Cancer:  Family History of Diabetes:  No FH of Colon Cancer:  Social History: Last updated: 08/28/2008 Married  Tobacco Use - Former. - 2003 Alcohol Use - yes Regular Exercise - no Drug Use - no  Review of Systems       Denies fever, malais, weight loss, blurry vision, decreased visual acuity, cough, sputum, SOB, hemoptysis, pleuritic pain, palpitaitons, heartburn, abdominal pain, melena, lower extremity edema, claudication, or rash.   Vital Signs:  Patient profile:   75 year old female Height:      61 inches Weight:      165 pounds BMI:     31.29 Pulse rate:   65 / minute Pulse rhythm:    regular BP sitting:   144 / 88  (left arm)  Vitals Entered By: Gypsy Balsam RN BSN (February 24, 2010 9:36 AM)  Physical Exam  General:  Affect appropriate Healthy:  appears stated age HEENT: normal Neck supple with no adenopathy JVP normal no bruits no thyromegaly Lungs clear with no wheezing and good diaphragmatic motion Heart:  S1/S2 midl AS  murmur n o,rub, gallop or click PMI normal Abdomen: benighn, BS positve, no tenderness, no AAA no bruit.  No HSM or HJR Distal pulses intact with no bruits No edema Neuro non-focal Skin warm and dry    Impression & Recommendations:  Problem # 1:  CHEST PAIN-UNSPECIFIED (ICD-786.50) Normal ECG no need for further w.u Her updated medication list for this problem includes:    Toprol Xl 100 Mg Xr24h-tab (Metoprolol succinate) .Marland Kitchen... 1 tab by mouth once daily    Aspirin 81 Mg Tabs (Aspirin) .Marland Kitchen... 1 tab by mouth once daily  Problem # 2:  AORTIC STENOSIS, MILD (ICD-424.1)  F/U echo in 3 months  No change in murmur Her updated medication list for this problem includes:    Toprol Xl 100 Mg Xr24h-tab (Metoprolol succinate) .Marland Kitchen... 1 tab by mouth once daily  Orders: Echocardiogram (Echo)  Problem # 3:  HYPERLIPIDEMIA (ICD-272.4) Need lab work from [facility].  Continue statin Her updated medication list for this problem includes:    Simvastatin 40 Mg Tabs (Simvastatin) .Marland Kitchen... Take 1 tablet by mouth once a day  Patient Instructions: 1)  Your physician recommends that you schedule a follow-up appointment in: 3 months with Dr Eden Emms with echocardiogram same day. 2)  Your physician has requested that you have an echocardiogram.  Echocardiography is a painless test that uses sound waves to create images of your heart. It provides your doctor with information about the size and shape of your heart and how well your heart's chambers and valves are working.  This procedure takes approximately one hour. There are no restrictions for this  procedure.   EKG Report  Procedure date:  02/24/2010  Findings:      NSR 66 Normal ECG

## 2010-06-03 ENCOUNTER — Ambulatory Visit (HOSPITAL_COMMUNITY): Payer: Medicare Other | Attending: Cardiovascular Disease

## 2010-06-03 ENCOUNTER — Ambulatory Visit (INDEPENDENT_AMBULATORY_CARE_PROVIDER_SITE_OTHER): Payer: Medicare Other | Admitting: Cardiovascular Disease

## 2010-06-03 ENCOUNTER — Encounter: Payer: Self-pay | Admitting: Cardiovascular Disease

## 2010-06-03 DIAGNOSIS — I10 Essential (primary) hypertension: Secondary | ICD-10-CM | POA: Insufficient documentation

## 2010-06-03 DIAGNOSIS — R072 Precordial pain: Secondary | ICD-10-CM

## 2010-06-03 DIAGNOSIS — E785 Hyperlipidemia, unspecified: Secondary | ICD-10-CM | POA: Insufficient documentation

## 2010-06-03 DIAGNOSIS — I359 Nonrheumatic aortic valve disorder, unspecified: Secondary | ICD-10-CM

## 2010-06-03 DIAGNOSIS — I080 Rheumatic disorders of both mitral and aortic valves: Secondary | ICD-10-CM | POA: Insufficient documentation

## 2010-06-10 NOTE — Assessment & Plan Note (Signed)
Summary: 3 MONTH ROV.SL/JT RS PER PT CALL-ML/JT appt confirm=mj   Referring Provider:  Rudi Heap, MD Primary Provider:  Rudi Heap, MD  CC:  pt complains of headaches also ringing in ears.  History of Present Illness: [name] is seen today in followup for chest pain aortic stenosis and hypercholesterolemia.  His been doing well.  She has arthritis and fibromyalgia.  She's been having some atypical sharp pain in the left chest.  It is sporadic it can be weekly or sometimes every 2-3 weeks.  She takes aspirin and feels better.  She had an essentially normal heart  cath in 2008 and a normal Myoview 2009.  Her EKGs have been normal.  Don't think her current chest pains or angina.  She has mild aortic stenosis.  Her mean aortic valve gradient was 10 with a peak gradient of 14 by echo on 11/10  Her dentition is in good shape.  He is not any  palpitations PND orthopnea his been no syncope lower extremity edema or fever of unknown origin.    Current Problems (verified): 1)  Anemia-unspecified  (ICD-285.9) 2)  Constipation  (ICD-564.00) 3)  Fibromyalgia  (ICD-729.1) 4)  Hyperlipidemia  (ICD-272.4) 5)  Diverticulosis-colon  (ICD-562.10) 6)  Personal History of Colonic Polyps  (ICD-V12.72) 7)  Hemorrhoids, Internal  (ICD-455.0) 8)  Hemorrhoids, External  (ICD-455.3) 9)  Rectal Bleeding  (ICD-569.3) 10)  Chest Pain-unspecified  (ICD-786.50) 11)  Gerd  (ICD-530.81) 12)  Aortic Stenosis, Mild  (ICD-424.1) 13)  Hypercholesterolemia Iia  (ICD-272.0) 14)  Palpitations  (ICD-785.1) 15)  Hypertension, Unspecified  (ICD-401.9)  Current Medications (verified): 1)  Omeprazole 20 Mg Tbec (Omeprazole) .Marland Kitchen.. 1 Tab By Mouth Once Daily 2)  Dulcolax 5 Mg Tbec (Bisacodyl) .... As Needed 3)  Simvastatin 40 Mg Tabs (Simvastatin) .... Take 1 Tablet By Mouth Once A Day 4)  Aspirin 81 Mg  Tabs (Aspirin) .Marland Kitchen.. 1 Tab By Mouth Once Daily 5)  Vitamin C 1000 Mg Tabs (Ascorbic Acid) .... Tab By Mouth Once Daily 6)  Cvs  Vitamin E 1000 Unit Caps (Vitamin E) .... Tab By Mouth Once Daily 7)  Citracal Plus  Tabs (Multiple Minerals-Vitamins) .Marland Kitchen.. 1 Tab By Mouth Once Daily 8)  Anamantle Hc 3-0.5 % Kit (Lidocaine-Hydrocortisone Ace) .... Use Rectally Twice Daily 9)  Lidamantle Hc 3-0.5 % Crea (Lidocaine-Hydrocortisone Ace) .... Use Rectally Twice Daily 10)  Metoprolol Tartrate 50 Mg Tabs (Metoprolol Tartrate) .Marland Kitchen.. 1 Tab 2 Times Per Day  Allergies (verified): No Known Drug Allergies  Past History:  Past Medical History: Last updated: 01/13/2010 CHEST PAIN-UNSPECIFIED (ICD-786.50) GERD (ICD-530.81) AORTIC STENOSIS, MILD (ICD-424.1) HYPERCHOLESTEROLEMIA  IIA (ICD-272.0) PALPITATIONS (ICD-785.1) HYPERTENSION, UNSPECIFIED (ICD-401.9) FIBROMYALGIA ADENOMATOUS COLON  POLYP 11/08 DIVERTICULOSIS INT/EXT HEMORRHOIDS    Past Surgical History: Last updated: 08/28/2008 Neck surgery, status post thyroidectomy as a child.  Cholecystectomy.  Appendectomy.  Bladder tack.  T&A.   Family History: Last updated: 01/13/2010 Family History of Cancer:  Family History of Diabetes:  No FH of Colon Cancer:  Social History: Last updated: 08/28/2008 Married  Tobacco Use - Former. - 2003 Alcohol Use - yes Regular Exercise - no Drug Use - no  Review of Systems       Denies fever, malais, weight loss, blurry vision, decreased visual acuity, cough, sputum, SOB, hemoptysis, pleuritic pain, palpitaitons, heartburn, abdominal pain, melena, lower extremity edema, claudication, or rash.   Vital Signs:  Patient profile:   75 year old female Height:      61 inches Weight:  164 pounds BMI:     31.10 Pulse rate:   67 / minute Resp:     14 per minute BP sitting:   132 / 72  (left arm)  Vitals Entered By: Kem Parkinson (June 03, 2010 12:20 PM) CC: pt complains of headaches also ringing in ears   Physical Exam  General:  Affect appropriate Healthy:  appears stated age HEENT: normal Neck supple with no  adenopathy JVP normal no bruits no thyromegaly Lungs clear with no wheezing and good diaphragmatic motion Heart:  S1/S2 preserved with mild AS murmur, no rub, gallop or click PMI normal Abdomen: benighn, BS positve, no tenderness, no AAA no bruit.  No HSM or HJR Distal pulses intact with no bruits No edema Neuro non-focal Skin warm and dry    Impression & Recommendations:  Problem # 1:  HYPERLIPIDEMIA (ICD-272.4)  Continue statin labs per [facility] Her updated medication list for this problem includes:    Simvastatin 40 Mg Tabs (Simvastatin) .Marland Kitchen... Take 1 tablet by mouth once a day  Her updated medication list for this problem includes:    Simvastatin 40 Mg Tabs (Simvastatin) .Marland Kitchen... Take 1 tablet by mouth once a day  Problem # 2:  CHEST PAIN-UNSPECIFIED (ICD-786.50)  Atypical with previously negative w/u. Observe The following medications were removed from the medication list:    Toprol Xl 100 Mg Xr24h-tab (Metoprolol succinate) .Marland Kitchen... 1 tab by mouth once daily Her updated medication list for this problem includes:    Aspirin 81 Mg Tabs (Aspirin) .Marland Kitchen... 1 tab by mouth once daily    Metoprolol Tartrate 50 Mg Tabs (Metoprolol tartrate) .Marland Kitchen... 1 tab 2 times per day  The following medications were removed from the medication list:    Toprol Xl 100 Mg Xr24h-tab (Metoprolol succinate) .Marland Kitchen... 1 tab by mouth once daily Her updated medication list for this problem includes:    Aspirin 81 Mg Tabs (Aspirin) .Marland Kitchen... 1 tab by mouth once daily    Metoprolol Tartrate 50 Mg Tabs (Metoprolol tartrate) .Marland Kitchen... 1 tab 2 times per day  Problem # 3:  AORTIC STENOSIS, MILD (ICD-424.1)  Echo today.  No change in murmur.   The following medications were removed from the medication list:    Toprol Xl 100 Mg Xr24h-tab (Metoprolol succinate) .Marland Kitchen... 1 tab by mouth once daily Her updated medication list for this problem includes:    Metoprolol Tartrate 50 Mg Tabs (Metoprolol tartrate) .Marland Kitchen... 1 tab 2  times per day  The following medications were removed from the medication list:    Toprol Xl 100 Mg Xr24h-tab (Metoprolol succinate) .Marland Kitchen... 1 tab by mouth once daily Her updated medication list for this problem includes:    Metoprolol Tartrate 50 Mg Tabs (Metoprolol tartrate) .Marland Kitchen... 1 tab 2 times per day  Problem # 4:  HYPERTENSION, UNSPECIFIED (ICD-401.9)  Change to lopresser tartrate due to cost.  Low sodium diet The following medications were removed from the medication list:    Toprol Xl 100 Mg Xr24h-tab (Metoprolol succinate) .Marland Kitchen... 1 tab by mouth once daily Her updated medication list for this problem includes:    Aspirin 81 Mg Tabs (Aspirin) .Marland Kitchen... 1 tab by mouth once daily    Metoprolol Tartrate 50 Mg Tabs (Metoprolol tartrate) .Marland Kitchen... 1 tab 2 times per day  The following medications were removed from the medication list:    Toprol Xl 100 Mg Xr24h-tab (Metoprolol succinate) .Marland Kitchen... 1 tab by mouth once daily Her updated medication list for this problem includes:  Aspirin 81 Mg Tabs (Aspirin) .Marland Kitchen... 1 tab by mouth once daily    Metoprolol Tartrate 50 Mg Tabs (Metoprolol tartrate) .Marland Kitchen... 1 tab 2 times per day  Patient Instructions: 1)  Your physician wants you to follow-up in: 12 months with Dr.Jimmy Stipes  You will receive a reminder letter in the mail two months in advance. If you don't receive a letter, please call our office to schedule the follow-up appointment. 2)  We will call you about the results of your ECHO test Prescriptions: METOPROLOL TARTRATE 50 MG TABS (METOPROLOL TARTRATE) 1 tab 2 times per day  #60 x 11   Entered by:   Layne Benton, RN, BSN   Authorized by:   Colon Branch, MD, [facility]   Signed by:   Layne Benton, RN, BSN on 06/03/2010   Method used:   Electronically to        [facility] [phone]* (retail)       [address] [place], [place]  [address]       Ph: [phone] or [phone]       Fax: [phone]   RxID:    [id number]

## 2010-06-25 ENCOUNTER — Telehealth: Payer: Self-pay | Admitting: Internal Medicine

## 2010-07-01 NOTE — Progress Notes (Signed)
Summary: Triage  Phone Note Call from Patient Call back at Home Phone [phone]   Caller: Patient Call For: Dr. Leone Payor Reason for Call: Talk to Nurse Summary of Call: Rectal bleeding and pain from her hemorrhoids Initial call taken by: Karna Christmas,  June 25, 2010 12:50 PM  Follow-up for Phone Call        Patient reports rectal bleeding for a couple of weeks and she thinks it's from hemorrhoids. She reports bleeding with a BM and she also sees blood from wiping after urination. She remains constipated and stated she is taking the Dulcolax She stated she is out of the cream she was given last year and she has been placing ice on her bottom. Last office visit on 01/13/10 with rectal bleeding followed by a COLON on 01/15/10. Results of COLON was moderate Diverticulosis, Melanosis, Internal Hemorrhoids, polyp with path of Tubular Adenoma. Patient was advised to take warm baths rather than ice for her hemmorrhoids. Patient was placed on Lidamantle d/t her insurance; can she have a refill or does she need to be seen by Mike Gip PAC ? Thanks. Follow-up by: Graciella Freer RN,  June 25, 2010 2:46 PM  Additional Follow-up for Phone Call Additional follow up Details #1::        she should be using MiraLax at least one dose a day she was referred to surgery - or it was at least recommended and she should see them re: hemorrhoids - they might be able to help her with office procedures and not surgery may refill the lidamantle x 1 Additional Follow-up by: Iva Boop MD, Clementeen Graham,  June 25, 2010 3:22 PM    Additional Follow-up for Phone Call Additional follow up Details #2::    Notified patient I will order cream for her rectal area and Dr Leone Payor recommends a surgical consult for her hemorrhoids. I will call her in the am with an appointment. Patient stated understanding. Graciella Freer RN  June 25, 2010 5:02 PM    Informed patient of her appointment with Dr Gaynelle Adu at [facility],  tomorrow, June 27, 2010 @ 2:50pm, arrive @ 2:30pm. Notes faxed to [facility]. Patient reminded to take Miralax daily. Follow-up by: Graciella Freer RN,  June 26, 2010 9:47 AM  New/Updated Medications: LIDOCAINE HCL 2 % GEL (LIDOCAINE HCL) apply to rectal area twice daily for hemorrhoids HYDROCORTISONE 2.5 % CREA (HYDROCORTISONE) Apply to rectal area twice daily for hemorrhoids Prescriptions: HYDROCORTISONE 2.5 % CREA (HYDROCORTISONE) Apply to rectal area twice daily for hemorrhoids  #1 x 0   Entered by:   Graciella Freer RN   Authorized by:   Iva Boop MD, [facility]   Signed by:   Graciella Freer RN on 06/25/2010   Method used:   Electronically to        [facility] [phone]* (retail)       [address] [place], [place]  [address]       Ph: [phone] or [phone]       Fax: [phone]   RxID:   [id number] LIDOCAINE HCL 2 % GEL (LIDOCAINE HCL) apply to rectal area twice daily for hemorrhoids  #1 x 0   Entered by:   Graciella Freer RN   Authorized by:   Iva Boop MD, [facility]   Signed by:   Graciella Freer RN on 06/25/2010   Method used:   Electronically to  [facility] [phone]* (retail)       [address] [place], [place]  [address]       Ph: [phone] or [phone]       Fax: [phone]   RxID:   [id number]

## 2010-07-04 ENCOUNTER — Telehealth: Payer: Self-pay | Admitting: Cardiovascular Disease

## 2010-07-04 NOTE — Telephone Encounter (Signed)
Spoke with [name], clearance will need to be refaxed

## 2010-07-25 ENCOUNTER — Other Ambulatory Visit (HOSPITAL_COMMUNITY): Payer: Self-pay | Admitting: General Surgery

## 2010-07-25 ENCOUNTER — Other Ambulatory Visit: Payer: Self-pay | Admitting: General Surgery

## 2010-07-25 ENCOUNTER — Encounter (HOSPITAL_COMMUNITY): Payer: Medicare Other

## 2010-07-25 ENCOUNTER — Telehealth: Payer: Self-pay | Admitting: Cardiovascular Disease

## 2010-07-25 ENCOUNTER — Ambulatory Visit (HOSPITAL_COMMUNITY)
Admission: RE | Admit: 2010-07-25 | Discharge: 2010-07-25 | Disposition: A | Discharge status: Home / Self Care | Payer: Medicare Other | Source: Ambulatory Visit | Attending: General Surgery | Admitting: General Surgery

## 2010-07-25 DIAGNOSIS — Z01818 Encounter for other preprocedural examination: Secondary | ICD-10-CM | POA: Insufficient documentation

## 2010-07-25 DIAGNOSIS — I10 Essential (primary) hypertension: Secondary | ICD-10-CM

## 2010-07-25 DIAGNOSIS — K648 Other hemorrhoids: Secondary | ICD-10-CM | POA: Insufficient documentation

## 2010-07-25 DIAGNOSIS — Z01811 Encounter for preprocedural respiratory examination: Secondary | ICD-10-CM | POA: Insufficient documentation

## 2010-07-25 DIAGNOSIS — Z981 Arthrodesis status: Secondary | ICD-10-CM | POA: Insufficient documentation

## 2010-07-25 DIAGNOSIS — Z01812 Encounter for preprocedural laboratory examination: Secondary | ICD-10-CM | POA: Insufficient documentation

## 2010-07-25 LAB — DIFFERENTIAL
Eosinophils Relative: 5 % (ref 0–5)
Monocytes Absolute: 0.8 10*3/uL (ref 0.1–1.0)
Monocytes Relative: 11 % (ref 3–12)
Neutrophils Relative %: 51 % (ref 43–77)

## 2010-07-25 LAB — BASIC METABOLIC PANEL
CO2: 28 mEq/L (ref 19–32)
Calcium: 9.5 mg/dL (ref 8.4–10.5)
Chloride: 105 mEq/L (ref 96–112)
Creatinine, Ser: 0.6 mg/dL (ref 0.4–1.2)
GFR calc non Af Amer: >60 mL/min (ref >60)
Glucose, Bld: 106 mg/dL — ABNORMAL HIGH (ref 70–99)
Sodium: 138 mEq/L (ref 135–145)

## 2010-07-25 LAB — CBC
HCT: 41 % (ref 36.0–46.0)
MCHC: 32.7 g/dL (ref 30.0–36.0)
WBC: 7.1 10*3/uL (ref 4.0–10.5)

## 2010-07-25 LAB — SURGICAL PCR SCREEN
MRSA, PCR: NEGATIVE
Staphylococcus aureus: NEGATIVE

## 2010-07-25 NOTE — Telephone Encounter (Signed)
All Cardiac Records faxed to [facility] @ [phone] 07/25/10/KM

## 2010-07-26 ENCOUNTER — Other Ambulatory Visit: Payer: Self-pay | Admitting: Internal Medicine

## 2010-08-07 ENCOUNTER — Other Ambulatory Visit: Payer: Self-pay | Admitting: General Surgery

## 2010-08-07 ENCOUNTER — Ambulatory Visit (HOSPITAL_COMMUNITY)
Admission: RE | Admit: 2010-08-07 | Discharge: 2010-08-07 | Disposition: A | Discharge status: Home / Self Care | Payer: Medicare Other | Source: Ambulatory Visit | Attending: General Surgery | Admitting: General Surgery

## 2010-08-07 DIAGNOSIS — K648 Other hemorrhoids: Secondary | ICD-10-CM | POA: Insufficient documentation

## 2010-08-07 DIAGNOSIS — Z860100 Personal history of colon polyps, unspecified: Secondary | ICD-10-CM | POA: Insufficient documentation

## 2010-08-07 DIAGNOSIS — K625 Hemorrhage of anus and rectum: Secondary | ICD-10-CM | POA: Insufficient documentation

## 2010-08-07 HISTORY — PX: HEMORRHOIDECTOMY WITH HEMORRHOID BANDING: SHX5633

## 2010-08-20 ENCOUNTER — Encounter (INDEPENDENT_AMBULATORY_CARE_PROVIDER_SITE_OTHER): Payer: Self-pay | Admitting: General Surgery

## 2010-08-20 NOTE — Op Note (Signed)
[name], [name]                   ACCOUNT NO.:  [id number]  MEDICAL RECORD NO.:  [id number]           PATIENT TYPE:  O  LOCATION:  DAYL                         FACILITY:  [facility]  PHYSICIAN:  Mary Sella. Andrey Campanile, MD     DATE OF BIRTH:  [birth year]  DATE OF PROCEDURE:  08/07/2010 DATE OF DISCHARGE:                              OPERATIVE REPORT   PREOPERATIVE DIAGNOSIS:  Internal hemorrhoids.  POSTOPERATIVE DIAGNOSES: 1. Two-column grade 3 internal hemorrhoid in the right anterior and     left lateral position. 2. Right posterior grade 2 internal hemorrhoids.  PROCEDURE: 1. Exam under anesthesia. 2. Right posterior internal hemorrhoidal banding. 3. Right anterior and left lateral excisional hemorrhoidectomy.  SURGEON:  Mary Sella. Andrey Campanile, M.D.  ASSISTANT SURGEON:  None.  ANESTHESIA:  General plus 46 mL of 0.25% Marcaine with epinephrine.  SPECIMEN:  Left and right internal hemorrhoids.  ESTIMATED BLOOD LOSS:  Minimal.  INDICATIONS FOR PROCEDURE:  The patient is a 75 year old female who has been having intermittent rectal bleeding.  She describes it as blood mixed with her stool as well as in the commode as well as on the toilet tissue.  She also feels that her hemorrhoids sticked out and she has to manually reduce it sometimes.  Most of the time it all spontaneously reduced by itself.  She has daily bowel movements.  On physical exam, she had evidence of at least two column grade 3 internal hemorrhoids. She had a colonoscopy to evaluate for bleeding and other than diverticular disease, she was just found to have simple hemorrhoids. She had a polyp that was a tubular adenoma.  We discussed the risks, benefits of exam under anesthesia, hemorrhoidectomy, and possible hemorrhoidal banding.  We discussed risks of bleeding, infection, injury to surrounding structures such as the vagina or sphincter, anal canal narrowing, urinary retention, DVT occurrence and possible need for additional  procedures.  We also talked about a typical postoperative course and how it is essential for her not to become constipated.  She elected to proceed to the operating room.  DESCRIPTION OF PROCEDURE:  After obtaining informed consent, the patient was brought to the operating room.  General endotracheal anesthesia was established.  She was then placed in the prone jackknife position with the appropriate padding.  Sequential compression devices are then placed.  Her buttocks were taped apart.  Her perineum and rectum were prepped with Betadine.  A surgical time-out was performed.  She received cefoxitin prior to skin incision.  Digital rectal exam was performed, which demonstrated no signs of stenosis.  On visual inspection of her perineum, she had protuberant hemorrhoids in the right anterior and left lateral position.  Anoscopy was then performed in a circumferential manner.  She had two column hemorrhoid in the right anterior and left lateral position.  She also had a grade 2 right posterior hemorrhoid.  The right posterior hemorrhoid was banded in a typical fashion.  Two bands were placed at the base of the hemorrhoid.  I then turned my attention to the right anterior two-column hemorrhoid.  Local was infiltrated in the anoderm  and underneath the hemorrhoid.  I then made a V-shaped incision with a 15-blade in the anoderm.  I then using a hemostat lifted up the mucosa and submucosa from the underlying sphincter.  Then, using the harmonic scalpel, I elliptically excised the hemorrhoid in its entirety.  There are no muscular fibers that have been disupted.  The base of the wound was dry without any signs of bleeding.  This passed off the field as specimen.  Anoscope was then repositioned in the left lateral position. Local was infiltrated in the anoderm and underneath this two-column hemorrhoid.  In a similar fashion, a V-shaped incision was made in the anoderm with 15-blade.  Hemostat was  used to lift up the mucosa and submucosa at the ends of the hemorrhoid from the underlying sphincter. Again, a harmonic scalpel was used to make an elliptical excision on this hemorrhoid.  The base was dry.  I then infiltrated local underneath the skin in a circumferential manner around the anus.  I then put local deep on the left and the right along the sphincter.  Gelfoam was then placed in the rectum and Depakene ointment was smeared around the perineum.  4 x 4's, ABDs, and mesh underwear were then placed.  The patient was then placed onto the stretcher and then extubated.  All needle, instrument and sponge counts were correct x2.  There were no immediate complications.  The patient tolerated the procedure well.     Mary Sella. Andrey Campanile, MD     EMW/MEDQ  D:  08/07/2010  T:  08/07/2010  Job:  [id number]  cc:   Iva Boop, MD,FACG [facility] [address] [place], [place] [address]  Noralyn Pick. Eden Emms, MD, [facility] [address]  [address] [place] [address]  Electronically Signed by Gaynelle Adu M.D. on 08/20/2010 02:56:47 PM

## 2010-08-26 NOTE — Assessment & Plan Note (Signed)
[facility]                            CARDIOLOGY OFFICE NOTE   [name], [name]                          MRN:          [id number]  DATE:05/30/2007                            DOB:          1935-09-30    [name] returns today follow-up.  She has had chest pain.  She had normal  Myoview with an EF of 78%.  She had a murmur and has mild aortic  stenosis by echo.   She has been doing fairly well.  She had poor exercise tolerance and I  encouraged her to get into a physical activity program.  She had  systolic hypertension with exercise and we will keep her on a beta-  blocker.   REVIEW OF SYSTEMS:  Remarkable for reflux.  She cannot afford the  Protonix that someone gave her.  I wrote her a generic prescription for  Nexium.  Otherwise negative.   Meds:  Listed and verified in chart.  BB, ASA, nexium   EXAM:  Remarkable for healthy-appearing Spanish female in no distress.  Blood pressure is 140/80, pulse 80 and regular, we 165, respiratory 14.  HEENT:  Unremarkable.  Carotids are without bruit, no lymphadenopathy,  thyromegaly, JVP elevation.  LUNGS:  Clear diaphragmatic motion.  No wheezing.  S1-S2 is preserved.  There is mild AS murmur.  PMI is normal.  ABDOMEN:  Benign.  Bowel sounds positive.  No AAA no bruit.  No  hepatosplenomegaly or hepatojugular reflux.  Distal pulse intact, no edema.  NEURO:  Nonfocal.  SKIN:  Warm and dry.   IMPRESSION:  1. Chest pain resolved normal Myoview no evidence coronary disease.      Continue baby aspirin.  2. Hypertension.  Continue beta-blocker.  Low-salt diet.  Follow-up      and blood pressure readings.  3. Mild aortic stenosis.  Continue follow-up with echo in a year.  No      need for SBE prophylaxis.  4. Reflux.  Switch to generic Nexium to save money. Low spice diet.      Avoid late-night meals.  I will see her back in 6 months.     Noralyn Pick. Eden Emms, MD, [facility]  Electronically Signed    PCN/MedQ  DD:  05/30/2007  DT: 05/30/2007  Job #: [id number]

## 2010-08-26 NOTE — H&P (Signed)
NAMETRIXY, [name]                   ACCOUNT NO.:  [id number]   MEDICAL RECORD NO.:  [id number]          PATIENT TYPE:  EMS   LOCATION:  MAJO                         FACILITY:  [facility]   PHYSICIAN:  Peter C. Eden Emms, MD, FACCDATE OF BIRTH:  April 30, 1935   DATE OF ADMISSION:  10/08/2006  DATE OF DISCHARGE:                              HISTORY & PHYSICAL   SUMMARY OF HISTORY:  [name] is a 75 year old female referred from her  primary care physician's office to [facility] emergency room via EMS  secondary to chest discomfort.  [name] states that last night she was  awakened around midnight with a left anterior chest tightness radiating  into her left shoulder and arm.  She admitted to palpitations but denied  shortness of breath, nausea or vomiting or diaphoresis.  She gave the  discomfort a 1 to 2 on a scale of 0/10.  It lasted less then 40 minutes.  She stated that she tried to relax.  She was able to return to sleep;  however, around 3:30, she was re-awakened with a similar discomfort but  she also noticed a lot of burning; her left arm was numb at this time  and she felt it was slightly worse than before and gave it a 2 on a  scale of 0/10 and it radiated into her neck.  She walked around the  house and this did not change with activity.  It lasted over two hours  before it abated.  The discomfort returned at approximately 8 o'clock at  which time she called her primary care physician and went to see them.  In the office, she was given four baby aspirin, a sublingual  nitroglycerine and oxygen with relief.  EMS was called for  transportation.  An EKG in the office and with the EMS did not show any  acute changes.  During EMS transport, her discomfort returned and she  received another sublingual nitroglycerine.  In the emergency room, she  continued to have some slight discomfort; a 1 on a scale of 0/2.  In the  emergency room, we did give her a GI cocktail and her discomfort was a  zero but she cannot tell if the GI cocktail actually helped.   On questioning her about prior occurrences, she first states that no,  this has never happened.  Then she states she has been having some  discomfort over the preceding four days and then she states that, yes,  she has had it over the last three months.  These have been mostly  occurring at night.  Thus, she is a somewhat difficult historian.   PAST MEDICAL HISTORY:  Latex causes a rash.  Medications prior to  admission, according to the patient, include Lipitor 40 mg q. h.s.,  aspirin 81 mg q.d., some type of reflux medication and multivitamin.   Past medical history is notable for:  1. Hyperlipidemia; unknown last check.  2. Fibromyalgia which is different from her above discomfort.  3. Chronic low back pain.  4. Obstructive sleep apnea.  She states she cannot  tolerate CPAP.   She states she has had:  1. Neck surgery, status post thyroidectomy as a child.  2. Cholecystectomy.  3. Appendectomy.  4. Bladder tack.  5. T&A.   SOCIAL HISTORY:  She resides in [place] with her husband.  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  She has six children, 16 grandchilden, no great-  grandchildren.  She has not smoked in five years.  Prior to this, she  smoked four to five cigarettes per day for three years.  She might have  a beer once every four months.  She denies any drugs, herbal  medications, specific diet or exercise.   FAMILY HISTORY:  Her mother is deceased at the age of 68 with liver  cancer.  Her father at the age of 60 and she states that he was murdered  with a gunshot wound.  She has one brother with a history of diabetes.  Her sister is alive and well.   REVIEW OF SYSTEMS:  In addition to the above, it is notable for:  SINUSES:  Rare sinus problems, related to seasonal allergies.  EYES:  Reading glasses.  MOUTH:  Partials.  EXTREMITIES:  Some left ankle  edema.  GENITOURINARY:  Postmenopausal.  MUSCULOSKELETAL:  Arthralgia in  the  back and hands.  GASTROINTESTINAL;  Some bright-red blood per stool  secondary to hemorrhoids.  GERD.  Chronic constipation.   PHYSICAL EXAMINATION:  GENERAL:  Well-nourished, well-developed,  pleasant female in no apparent distress; husband present.  VITAL SIGNS:  Blood pressure in the ER was 128/67, temperature 98.4,  pulse 79 and regular, respirations 16, 98% saturation on 2 liters.  HEENT:  Grossly unremarkable.  NECK:  Supple without thyromegaly or adenopathy, JVD or carotid bruits.  CHEST:  Symmetrical excursion.  LUNGS:  Were clear to auscultation.  HEART:  PMI is not displaced.  Regular rate and rhythm.  She does have a  soft 1/6 systolic murmur best appreciated along the left sternal border.  SKIN/INTEGUMENT:  Intact without rashes or lesions.  ABDOMEN:  Obese.  Bowel sounds are present without organomegaly, masses  or tenderness.  EXTREMITIES:  There is no clubbing, cyanosis, or edema.  MUSCULOSKELETAL/NERVES:  Grossly unremarkable.   EKG in the emergency room shows normal sinus rhythm, right axis  deviation, early R wave, normal interval.  No specific change from EKG  in October 2002.  H&H is 13.1 and 38.1, normal indices, platelets 262.  WBC is 5.9.  D-dimer less than 0.22.  Chest x-ray and remainder of labs  are pending.   IMPRESSION:  Chest discomfort with typical and atypical features  concerning for cardiac ischemia.  She does not have any acute EKG  changes at this time.  Palpitations with a rare premature atrial  contraction on telemetry; however, no evidence of dysrhythmia.  History  is elicited per past medical history.   DISPOSITION:  Dr. Eden Emms reviewed the patient's history, spoke with and  examined the patient and agrees with the above.  We will admit to rule  out myocardial infarction.  We will begin her on IV heparin and  nitroglycerine.  We will continue her current medications.  We will add a beta blocker for cardiac protection.  Given the nature of  her  discomfort, and for definitive evaluation, a cardiac catheterization has  been recommended.  Dr. Eden Emms reviewed the procedures, risks and  benefits of performing a cardiac catheterization.  This can be performed  today for further evaluation.  The patient has agreed to  proceed as  planned.      Joellyn Rued, PA-C      Noralyn Pick. Eden Emms, MD, [facility]  Electronically Signed    EW/MEDQ  D:  10/08/2006  T:  10/09/2006  Job:  [id number]   cc:   [facility]  Noralyn Pick. Eden Emms, MD, [facility]

## 2010-08-26 NOTE — Assessment & Plan Note (Signed)
[facility]                            CARDIOLOGY OFFICE NOTE   NAME:Asaro, Arbie R                          MRN:          [id number]  DATE:12/22/2007                            DOB:          Mar 20, 1936    Ms. Weyandt returns today for followup.  She has hypertension,  palpitations with relative tachycardia, hypercholesterolemia, and mild  aortic stenosis.  She is originally from [place].  She has been doing  fairly well.  She is not having significant presyncope, chest pain.  Her  palpitations appear benign.  There is no sudden onset or offset of  symptoms.  There is no flip-flops.  They are not associated with chest  pain.  She is on a beta-blocker.  She can probably tolerate more.  She  does not need any event monitoring as they appear benign.   REVIEW OF SYSTEMS:  Remarkable for significant lower back pain.  She is  on muscle relaxants that are making her sleepy.  She has already had 2  cortisone shots.  Part of the problem is her sedentary lifestyle, being  overweight, otherwise negative.   CURRENT MEDICATIONS:  1. Simvastatin 40 a day.  2. Pantoprazole 40 a day.  3. Aspirin a day.  4. Metoprolol long-acting 50 mg a day, to be increased to 100.   The patient uses a [facility].   PHYSICAL EXAMINATION:  GENERAL:  Remarkable for an overweight Hispanic  female in no distress.  Affect is appropriate.  VITAL SIGNS:  Weight 175, which is actually up 10 pounds from when I saw  her in February.  Blood pressure 148/84, pulse 98 and regular,  respiratory rate 14, afebrile.  HEENT:  Unremarkable.  NECK:  No parvus, no tardus.  Transmitted murmur.  No bruit.  No  lymphadenopathy or thyromegaly.  LUNGS:  Clear with good diaphragmatic motion.  No wheezing.  S1-S2 with  a mild AS murmur.  Second heart sound is preserved.  No aortic  insufficiency.  PMI normal.  ABDOMEN:  Benign.  Bowel sounds positive.  No AAA, no tenderness, no  bruit, no  hepatosplenomegaly, or hepatojugular reflux.  No tenderness.  EXTREMITIES:  Distal pulses are intact.  No edema.  NEUROLOGIC:  Nonfocal.  SKIN:  Warm and dry.  No muscular weakness.   EKG is normal without LVH.   IMPRESSION:  1. Hypertension, currently well controlled.  Continue low-salt diet.  2. Palpitations, sinus rhythm.  Increase beta-blocker as her blood      pressure is borderline controlled, and she still has a relative      tachycardia.  3. Mild aortic stenosis.  Followup echo in February.  No need for      subacute bacterial endocarditis prophylaxis.  4. History of reflux.  Continue to encourage weight loss.  Avoid late-      night meals and recumbency.  5. Continue omeprazole.  6. Hypercholesterolemia.  Continue simvastatin.  Lipid and liver      profile in 6 months.     Noralyn Pick. Eden Emms, MD, [facility]  Electronically Signed  PCN/MedQ  DD: 12/22/2007  DT: 12/23/2007  Job #: [id number]

## 2010-08-26 NOTE — Cardiovascular Report (Signed)
NAMERUTHANN, [name]                   ACCOUNT NO.:  [id number]   MEDICAL RECORD NO.:  [id number]          PATIENT TYPE:  INP   LOCATION:  4729                         FACILITY:  [facility]   PHYSICIAN:  Arturo Morton. Riley Kill, MD, FACCDATE OF BIRTH:  11/09/1935   DATE OF PROCEDURE:  10/08/2006  DATE OF DISCHARGE:  10/09/2006                            CARDIAC CATHETERIZATION   INDICATIONS:  [name] is a 75 year old woman who presented with chest  discomfort radiating up into the shoulder and neck.  It was unclear as  to etiology.  The current study was done to assess coronary anatomy.   PROCEDURE:  1. Left heart catheterization.  2. Selective coronary arteriography.  3. Selective left ventriculography.  4. Proximal root aortography.   DESCRIPTION OF PROCEDURE:  The patient brought to the catheterization  laboratory after being seen by Dr. Eden Emms.  She was on a heparin drip.  Following informed consent the right femoral artery was easily entered,  using a 5-French sheath.  ECT was checked.  Views of the left and right  coronary was then obtained in several angiographic projections.  Central  aortic and left ventricular pressures were measured with a pigtail.  Ventriculography was performed in the RAO projection.  We then did an  aortic root shot to better assess the aortic root.  There were no  complications.  She was taken to the holding area for direct manual  hemostasis.   HEMODYNAMIC DATA:  1. Central aortic pressure 140/73, mean 103.  2. Left atrial pressure 140/12.  3. There is no gradient pullback across the aortic valve.   ANGIOGRAPHIC DATA:  1. On plain fluoroscopy, there was some calcification of the aortic      root, some calcification of the aortic valve, and also      calcification of the aortic knob.  2. The aortic root is upper normal size.  There is no evidence of      dissection, and the aorta appears to be smooth.  3. Ventriculography in the RAO projection reveals  vigorous global      systolic function without segmental wall motion abnormality.  4. The left main is free of critical disease.  It is fairly short.  5. The left anterior descending artery courses to the apex and wraps      the apical tip.  In the mid-portion of the vessel, just beyond the      origin of the diagonal branch, is an area of segmental narrowing.      This does not appear to be critical, and probably measures about      50% to 60% luminal reduction, but is in a long segmental area of      smaller caliber vessel and could even represent some level of      myocardial bridging, as there is some systolic milking at this      location.  We did take a shot in the left lateral view before and      after nitroglycerin, and it did appear to expand somewhat.  The  distal vessel consists of an apical vessel that wraps the apical      tip.  There is a small diagonal branch that bifurcates twice, and      is without critical narrowing.  6. The circumflex proper demonstrates a small ramus intermedius and a      large bifurcating marginal, both of which appear to be free of      significant obstruction.  There is perhaps some calcification      proximally.  7. The right coronary artery has a shepherd's crook takeoff, mid-      tortuosity, then provides a short small-caliber posterior      descending and a small bifurcating posterolateral branch.  None of      this appears to be critical.   CONCLUSIONS:  1. Well-preserved left ventricular function.  2. No evidence of aortic dissection or aortic regurgitation.  3. A 50% mid to distal LAD stenosis beyond the diagonal, which      represents either segmental plaquing and/or bridging.   PLAN:  We will get a follow-up 12-lead EKG.  Serial enzymes will  continue to be obtained.  In the absence of these findings, it is  probably doubtful that the symptoms are related to this.  We will also  get a D-dimer.      Arturo Morton. Riley Kill, MD,  [facility]  Electronically Signed     TDS/MEDQ  D:  10/08/2006  T:  10/09/2006  Job:  [id number]   cc:   Ernestina Penna, M.D.  Noralyn Pick. Eden Emms, MD, [facility]  CV Laboratory  Patient Medical Record

## 2010-08-26 NOTE — Discharge Summary (Signed)
[name], [name]                   ACCOUNT NO.:  [id number]   MEDICAL RECORD NO.:  [id number]          PATIENT TYPE:  INP   LOCATION:  4729                         FACILITY:  [facility]   PHYSICIAN:  Bettey Mare. [place], NPDATE OF BIRTH:  1936/02/09   DATE OF ADMISSION:  10/08/2006  DATE OF DISCHARGE:  10/09/2006                               DISCHARGE SUMMARY   PRIMARY CARE PHYSICIAN:  [facility]   PRIMARY CARDIOLOGIST:  Noralyn Pick. Eden Emms, MD, [facility]   PROCEDURE PERFORMED DURING HOSPITALIZATION:  Cardiac catheterization  completed on October 08, 2006, by Dr. Shawnie Pons with results normal  LV, no aortic obstruction, 50% mid LAD with bridging, please see Dr.  Rosalyn Charters thorough cardiac catheterization report for more details as it  is not available at time of discharge summary.   DISCHARGE DIAGNOSES:  1. Chest pain, believed to be noncardiac.  2. Mixed hyperlipidemia.  3. Fibromyalgia.  4. Gastroesophageal reflux disease.   HISTORY OF PRESENT ILLNESS:  This is a 75 year old female referred to  [name] from [facility] secondary to  recurrence of chest pain over 3 days, lasting approximately 3 or 4  minutes.  Described as left anterior chest tightness with radiation to  the left shoulder and arm.  Positive for palpitations, but denying any  shortness of breath, nausea, vomiting, or diaphoresis.  The pain was 1-  2/10.  The patient states that it awoke her from sleep, lasting  approximately 40 minutes and having chest burning.  The patient followup  with her primary care physician the following morning after another  reoccurrence about 5 a.m. and was given 4 baby aspirin by primary care  physician, aspirin, nitroglycerin, and oxygen and did have pain relief.  EMS was called and the patient was brought to the emergency room.  The  patient at that time was seen and examined by Dr. Charlton Haws and  admitted to rule out cardiac etiology for  chest discomfort.  EKG was  found to be normal and cardiac enzymes were also found to be normal,  with initial cardiac enzymes at 0.01.  The patient subsequently was  scheduled for a cardiac catheterization secondary to history of  hypercholesterolemia and strong family history.  The patient had cardiac  catheterization completed by Dr. Riley Kill with results described briefly  above.  The patient recovered well from the catheterization with no  further complaints or chest discomfort.  She was started on a proton  pump inhibitor as they felt her symptoms were related to GERD.  The  patient had no complaints of bleeding, hematoma, infection, or severe  pain at cardiac catheterization site.  There were no arrhythmias seen  post catheterization.   On day of discharge, the patient was seen and examined by Dr. Valera Castle and found to be stable for discharge with followup appointments  with Dr. Eden Emms and her primary care physician planned.  The patient was  started on Protonix 40 mg once a day and is to also continue her  medications prior to admission to the hospital.  LABORATORY DATA:  Sodium 140, potassium 4.4, chloride 105, CO2 26,  glucose 100, BUN 10, creatinine 0.60.  Cardiac markers, troponin 0.01,  0.02, and 0.03 respectively.  CK 73, 58, and 55 respectively.  CK-MB  1.4, 1.2, and 1 respectively.  Cholesterol was found to be 170.  Triglycerides 288, HDL 33, LDL 79, D-dimer negative at less than 0.22,  LFTs were found to be within normal limits.  TSH 1.979.  Blood pressure  119/66, pulse 75, respirations 20, temperature 97.9.  O2 sat 94% on room  air.  The patient weighed 73.5 kilos.   DISCHARGE MEDICATIONS:  1. Protonix 40 mg once a day.  2. Aspirin 81 mg once a day.  3. Lipitor 40 mg at bedtime.  4. Multivitamin.   ALLERGIES:  LATEX CAUSING A RASH.   IMAGING:  EKG at time of discharge, normal sinus rhythm with possible  right ventricular hypertrophy.   FOLLOW-UP PLANS AND  APPOINTMENT:  1. The patient will be scheduled to see Dr. Eden Emms post      catheterization followup for continued cardiology management and      assessment of symptoms improving with use of Protonix.  2. The patient is to followup with her primary care physician for      continued medical management through [facility].  3. The patient has been given cardiac catheterization instructions      with particular emphasis on the right groin site for evidence of      bleeding, hematoma, or infection.   TIME SEEN:  Time spent with the patient to include physician time, 50  minutes.      Bettey Mare. Lyman Bishop, NP     KML/MEDQ  D:  10/09/2006  T:  10/10/2006  Job:  [id number]   cc:   [facility]

## 2010-08-26 NOTE — Assessment & Plan Note (Signed)
[facility]                            CARDIOLOGY OFFICE NOTE   NAME:[name], [name]                          MRN:          [id number]  DATE:11/23/2006                            DOB:          01/23/1936    Ms. Gibbard is seen today in followup.  She was seen by me in the  emergency room at the end of June for chest pain and palpitations.  She  had a heart cath which did not show significant disease.  There was a  question of a 50% bridging type lesion in the LAD.  Dr. Riley Kill did not  think it was significant.  Since she had pain radiating to her back, he  did an aortic root injection which was normal with no evidence of  dissection.  Chest pain was thought to be noncardiac.   She has had a history of palpitations in the past.  She has been having  some PACs but no significant arrhythmias.  Overall, the patient thinks  that she was under a lot of stress and this may have contributed to her  ER visit.  Since she has been discharged from the hospital, she has been  doing well.  She needed 90-day prescriptions for her medications.   The patient also has a history of a long-standing murmur.  I noted on  her cath report that there was no gradient across the aortic valve but  she clearly has a systolic ejection murmur that needs further workup.   REVIEW OF SYSTEMS:  Otherwise negative.   CURRENT MEDICATIONS:  1. Prilosec 40 mg a day.  2. Simvastatin 40 a day.  3. Toprol 50 a day.  4. Aspirin a day.   PHYSICAL EXAMINATION:  GENERAL:  Remarkable for an elderly appearing  [place] female in no distress.  Affect is appropriate.  VITAL SIGNS:  Weight is 163, blood pressure is 130/70, pulse is 77 and  regular, respiratory rate is 14.  She is afebrile.  HEENT:  Normal.  NECK:  There are no carotid bruits, no lymphadenopathy, no thyromegaly,  no JVP elevation.  LUNGS:  Clear.  There is no wheezing and good diaphragmatic motion.  HEART:  There is an S1 S2  with a systolic ejection murmur of aortic  sclerosis.  Second heart sound is preserved.  There is no aortic  insufficiency.  PMI is normal.  ABDOMEN:  Benign.  Bowel sounds positive.  No hepatosplenomegaly.  No  hepatojugular reflux.  No AAA or bruit.  No tenderness.  Cath site is  well healed without residual bruit.  Femorals are plus 4 bilaterally  without bruit.  EXTREMITIES:  PT pulses are plus 3 bilaterally.  There is no lower  extremities edema.  NEUROLOGIC:  Nonfocal.  There is no muscular weakness.   Her EKG is essentially normal.   IMPRESSION:  1. Chest pain with catheterization showing non-flow-limiting lesion in      the mid left anterior descending artery.  Since there is a question      of bridging we will do a Myoview in about  6 months.  Her EKG is      normal.  She is not having any recurrent pain.  She has      nitroglycerin in case she needs it.  2. Risk factor modification, hypercholesterolemia.  Continue      simvastatin 40 a day.  Follow up with lipid and liver profile in 6      months with Paulita Cradle.  3. Reflux.  Continue Prilosec 40 mg a day, a low sodium diet, and      avoid late night meals.  4. History of palpitations with premature atrial contractions, no      evidence of paroxysmal supraventricular tachycardia.  The patient      will call me if she has any recurrent symptoms.  She could probably      tolerate slightly more beta-blocker if needed.  There is good left      ventricular function and no evidence of significant ventricular      arrhythmia.  5. Murmur.  The patient did not have an echocardiogram in the      hospital.  I think it is reasonable to follow up with this since      her murmur is fairly significant.  She clearly does not have      significant aortic stenosis on pullback gradient at      catheterization.  However, I would like to check and make sure she      does not have a bicuspid valve.   Overall, I think the patient is doing  well and I will see her back in 6  months when she has her echo and Myoview.     Noralyn Pick. Eden Emms, MD, [facility]  Electronically Signed    PCN/MedQ  DD: 11/23/2006  DT: 11/24/2006  Job #: [id number]

## 2010-08-29 NOTE — Op Note (Signed)
[facility]  Patient:    [name], [name] Visit Number: [id number] MRN: [id number]          Service Type: GYN Location: 4W 0456 02 Attending Physician:  Oliver Pila Dictated by:   Alvino Chapel, M.D. Proc. Date: 01/31/01 Admit Date:  01/31/2001                             Operative Report  PREOPERATIVE DIAGNOSES:  1. Uterine prolapse. 2. Cystocele with stress urinary incontinence. 3. Rectocele.  POSTOPERATIVE DIAGNOSES:   1. Uterine prolapse. 2. Cystocele with stress urinary incontinence. 3. Rectocele.  OPERATIVE PROCEDURE:  1. Total vaginal hysterectomy. 2. Anterior/posterior repair. 3. Tension-free vaginal tape with cystoscopy by Veverly Fells. Vernie Ammons, M.D.  SURGEON:  Alvino Chapel, M.D.  ASSISTANT:  Zenaida Niece, M.D.  ANESTHESIA:  General.  INTRAOPERATIVE FINDINGS:  Small uterus and ovaries noted. Ovaries were visually within normal limits, however, were not easily approachable and therefore were not removed as per patients wishes.  ESTIMATED BLOOD LOSS:  250 cc.  URINE OUTPUT:  350 cc clear urine.  INTRAVENOUS FLUIDS:   2800 cc Ringers lactate.  DESCRIPTION OF PROCEDURE:  The patient was taken to the operating room where general anesthesia was obtained without difficulty. She was then prepped and draped in the normal sterile fashion in the dorsal lithotomy position with a Foley catheter in place. A weighted speculum was placed into the vagina as well as a Deaver and the cervix was grasped with Jacobson tenaculum and easily pulled down to the level of the introitus. A dilute solution of Pitressin was then introduced submucosally around the cervix and this was carried out circumferentially. The cervix was then incised circumferentially with the Bovie cautery and the vaginal mucosa trimmed away with Mayo scissors. The posterior cul-de-sac was entered with Mayo scissors and a banana speculum placed within.  Anteriorly the bladder was pushed away from the surface of the uterus and the peritoneum identified and the anterior cul-de-sac entered with the Deaver placed within that. The uterus was isolated. It was removed in two bites with slightly curved Zephlin clamps. Each pedicle was secured with #0 Vicryl and a suture ligature with two sutures of the same on the utero-ovarian pedicle. The uterus was amputated and handed off to pathology. There was some bleeding noted on the patients right pedicle which was identified and sutured with two figure-of-eight #0 Vicryl sutures. The ovaries with a sponge stick were visible well above the vaginal cuff and were not mobile enough for removal, however, appeared within normal limits. The uterosacral ligaments were then approximated to one another and attention turned to the anterior surface of the vagina. This was grasped with Allis clamps and a small piece of vaginal mucosa trimmed away. The mucosa was then underscored with Metzenbaum scissors up to the level of the urethra. The patients anterior surface of the vagina was quite shortened, thus, this was approximately only 3-4 cm of mucosa opened. The vaginal mucosa was then trimmed with the Metzenbaum scissors and the pubovesical fascia deflected towards the midline bilaterally. The excess vaginal mucosa was then trimmed away and the pubovesical fascia reapproximated with several interrupted sutures of #0 Vicryl. At this point [place] C. Ottelin, M.D. stepped in and performed a tension-free vaginal tape procedure and cystoscopy which confirmed no bladder injury. The vaginal mucosa was then closed with 2-0 Vicryl in a running fashion and the vaginal cuff itself was  closed with #0 Vicryl in interrupted sutures. There was one small area of bleeding noted at the cuff which was controlled with additional figure-of-eight suture and then this appeared hemostatic. Attention was then turned to the posterior surface of  the vagina which was grasped with Allis clamps at the introitus. There was a small amount of vaginal mucosa trimmed away and the mucosa was then underscored in the midline with Metzenbaum scissors. This was carried as far posteriorly as was visible and the rectal mucosa was elevated while the underlying pubovesical fascia was trimmed and deflected towards the midline. The excess mucosa was trimmed away and with some difficulty three interrupted sutures were placed in the very thin, adherent pubovesical fascia. There was not much significant fascia to reapproximate overlying the rectum. The posterior surface was then closed with 2-0 Vicryl in a running fashion. Additional sutures of 3-0 Vicryl were used in a running fashion to close some lateral excoriations of the vaginal mucosa. At the conclusion of the procedure, the vagina did admit two fingers and was slightly tight, but appeared to be adequate for sexual intercourse. Vaginal packing was then placed within the vagina with no excessive vaginal bleeding noted and the Foley catheter remained in place at the conclusion of the procedure.  Sponge, needle, lap and instrument counts were correct x2 and the patient was taken to the recovery room in stable condition. Dictated by:   Alvino Chapel, M.D. Attending Physician:  Oliver Pila DD:  01/31/01 TD:  02/01/01 Job: [id number]

## 2010-08-29 NOTE — Assessment & Plan Note (Signed)
DATE OF SERVICE:  July 24, 2004.   MEDICAL RECORD NUMBER:  [id number].   DATE OF BIRTH:  19-Mar-1936.   A 75 year old female with history of L4-5 and L5-S1 spinal stenosis with  history of left lower extremity radicular pain.  She was seen and treated by  Dr. Pamelia Hoit on July 09, 2004, and started on Ultracet one p.o. q.8h.,  Lidoderm patch and physical therapy for body mechanics and proper posture,  as well as TENS unit.   She states that when she first came to the clinic she really had a hard time  walking and her pain was averaging around 8.  Currently her pain is  averaging 2 and her pain right now is 2/10.  She states her pain has been  reduced 60% by her medications.  She feels that the therapy has been  helping.  She can walk 20 minutes without assistance.  She can climb steps.  She states that when she is in the car for a prolonged period of time and  when she first gets up, she has stiffness and pain, but really this works  itself out in about a half of an hour.  Of note is that she has been doing  errands since 8 a.m. this morning and it is 3 p.m. now.   REVIEW OF SYSTEMS:  Positive in constitutional, cardiorespiratory, GI and  GU.  Specifically no suicidal thoughts.  Please see the 14-point review of  systems.   PHYSICAL EXAMINATION:  Pulse 80, respiratory rate 20, O2 saturation 99% on  room air, blood pressure 117/55.  Pain level 2/10.   The back has no tenderness to palpation of the lumbar spine.  She has 75%  range forward flexion and 50% extension.  She ambulates with mild antalgia  on the left, but otherwise no evident toe drag or knee instability.  Her  mood and affect were bright.  Deep tendon reflexes are normal in bilateral  lower extremities.  She has good strength in bilateral lower extremities.  Good range of motion at the hips.   IMPRESSION:  Lumbar spinal stenosis.  Much improved compared to  approximately three weeks ago with conservative care, i.e.  medications and  physical therapy.   PLAN:  At this time, I would not recommend transforaminal epidural sternoid  injection as likely not to have that much additional discomfort.  Certainly  if she does have an exacerbation of her pain, would be happy to do this at a  later date.   The patient will see Dr. Pamelia Hoit in about two weeks and she can follow up  the patient's progress at that time.     AEK/MedQ  D:  07/24/2004 14:56:50  T:  07/24/2004 17:02:23  Job #:  [id number]   cc:   Hilda Lias, M.D.  [address]  [place], [place] [address]  Fax: [phone]

## 2010-08-29 NOTE — Assessment & Plan Note (Signed)
MEDICAL RECORD NUMBER:  [id number].   [name] is back in today for a trochanteric bursitis injection on the  left. She was last seen on September 11, 2004. At that time, she was diagnosed in  addition to left sciatic symptoms trochanteric bursitis, iliotibial band  syndrome, and was set up for an injection. She is back in today. Reports  pain in her leg when she is up using it can be a 7 on a scale of 10. Her  average pain is a 2. Pain is not as bad when she is sitting. Pain is  described as sharp and to the lateral left hip area. Interferes with her  activity such as walking. She is able to climb stairs and drive. Does have  some intermittent numbness in the leg and trouble walking due to lateral hip  pain.   Complains today of some constipation, abdominal pain, limb swelling, sleep  apnea. Referred to her PCP.   No new changes in her past medical, social or family history since last  visit.   PHYSICAL EXAMINATION:  Blood pressure 127/71, pulse approximately 80,  respirations 16, 99% saturated on room air. She is a well-developed, mildly  obese female. She does not appear in any distress today. She is oriented x3.  Her affect is overall bright, alert. She is cooperative and pleasant. She  has an antalgic gait, especially when she first stands up but decreased  weight bearing in the left lower extremity. Seated, reflexes are 2+ at the  knees and 0 at the ankles. He has good strength in hip flexors, knee  extensors, dorsi flexors, plantar flexors, EHL. No obvious sensory deficits  with light touch today.   The risks and benefits of trochanteric bursitis injection were reviewed with  her including bleeding, infection, bruising, increased pain, skin changes.  She wished to proceed. Signed consent was obtained. Skin was prepped with  Betadine initially, then alcohol. Vapo-coolant spray was used to decrease  pain during injection. Two cc of Kenalog and 2 cc of lidocaine were injected  into the  left trochanteric area. The patient tolerated the procedure well.  There were no complications from the procedure. She had an appropriate  recovery immediately after she reported improvement in pain in the lateral  hip region. Instructions were given including icing over the next 24 to 48  hours. No complications were identified. There were no barriers to  communication perceived today. We will see her back in a month.       DMK/MedQ  D:  09/26/2004 13:48:08  T:  09/27/2004 07:59:54  Job #:  [id number]

## 2010-08-29 NOTE — Procedures (Signed)
   [name], [name]                             ACCOUNT NO.:  [id number]   MEDICAL RECORD NO.:  [id number]                   PATIENT TYPE:  AMB   LOCATION:  DAY                                  FACILITY:  [facility]   PHYSICIAN:  Vida Roller, M.D.                DATE OF BIRTH:  1936-04-08   DATE OF PROCEDURE:  09/18/2002  DATE OF DISCHARGE:                                EKG INTERPRETATION   PROCEDURE:  Electrocardiographic interpretation from June 7 at 1310.   There is normal sinus rhythm at a rate of 90 with normal intervals.  There  is normal P-wave axis and a normal T-wave axis but the QRS axis is actually  slightly rightward at 91 degrees.  There are no ST-T wave changes concerning  for ischemia and the left atrium is mildly enlarged.  No old EKG's for  comparison.                                               Vida Roller, M.D.    JH/MEDQ  D:  09/19/2002  T:  09/19/2002  Job:  [id number]

## 2010-08-29 NOTE — Assessment & Plan Note (Signed)
HISTORY OF PRESENT ILLNESS:  [name] is a very pleasant 75 year old  married female who is being seen in our [facility] for  left low back pain and sciatic symptoms in the left leg.   She reports overall she is doing better.  She was set up to see Dr.  Wynn Banker for a transforaminal steroid injection.  However, upon his visit  on April 13, her pain scores had diminished from 8 on a scale of 10 down to  a 2.  She had been put on Ultracet every 8 hours which she takes on a p.r.n.  basis, and had been using Lidoderm patches.  She reported a good deal of  improvement in her pain, and Dr. Wynn Banker evaluation, he felt that the  injection was not indicated at that time then.   She is back in today for recheck and refill of medications.  She reports, I  am doing better.  I can't sit too long, and at night my left leg bothers me  about four times a week.  She is pointing today.  Her main complaint of  pain is in the left lateral hip area over the left trochanter.   Pain is worse with standing and sitting and she is unable to lie on the left  side.  Her relief from the medications is actually quite good.  Pain is  intermittent and sharp to dull and aching in nature.   Functional status:  The patient can walk about 20 minutes at a time.  She is  able to climb stairs and drive.  She enjoys swimming.  She was last employed  back in 1993.  She does not need any assistance with self care of household  duties.  She does indicated she has some mild numbness in the left foot,  tingling, trouble walking mainly because of the left hip pain.   REVIEW OF SYSTEMS:  Positive for constipation, abdominal pain, limb  swelling, sleep apnea.   FOLLOWUP:  She will follow up with her primary care doctor for these  symptoms, and for the symptoms of limb swelling, sleep apnea and abdominal  pain as well as skin rash and night sweats.   Past medical history, social history and family visit  unchanged since last  visit.   PHYSICAL EXAMINATION:  VITAL SIGNS:  Blood pressure 134/76, pulse 97,  respirations 18, saturation 99%.  GENERAL APPEARANCE:  She is a well-developed, mildly obese, Hispanic female  who does not appear in any distress.  She is oriented x3.  Her affect is  overall bright, alert, cooperative and pleasant.  She is able to stand up  after being seated.  She reports initially some increased pain in the left  lateral hip area with the first three steps.  This evens out.  Her gait is  otherwise normal after the first two steps.  No increased pain with forward  flexion/extension.  Seated reflexes are 2+ at the knees and ankles, patellar  tendons and Achilles tendons.   She has normal tone throughout the lower extremities.  She has 5/5 strength  at hip flexors, knee extensors, dorsiflexors, plantar flexors.   Her right and left hips were evaluated today, further palpation over both  trochanter areas.  She has some mild tenderness on the right.  On the left,  she is exquisitely tender and cries out as I palpate gently over the left  trochanter.  She is also tender throughout the entire iliotibial band to  the  lateral knee area.   IMPRESSION:  1.  Lumbar spinal stenosis.  2.  Intermittent left sciatic symptoms.  3.  Left trochanteric bursitis iliotibial band syndrome.   PLAN:  Will add Neurontin at night 300 mg one p.o. q.h.s. #30.  She does not  need refills on her Ultracet or Lidoderm today.  We will get her set up for  a left hip injection at the next visit.  We will need insurance approval for  this, and we will see her back within the next 1-2 weeks for this.      DMK/MedQ  D:  09/11/2004 10:06:50  T:  09/11/2004 11:26:27  Job #:  [id number]

## 2010-08-29 NOTE — Discharge Summary (Signed)
[facility]  Patient:    [name], [name] Visit Number: [id number] MRN: [id number]          Service Type: GYN Location: 4W 0456 02 Attending Physician:  Oliver Pila Dictated by:   Huel Cote, M.D. Proc. Date: 03/04/01 Admit Date:  01/31/2001 Discharge Date: 02/02/2001                             Discharge Summary  DISCHARGE DIAGNOSES: 1. Uterine prolapse. 2. Cystocele with stress urinary incontinence. 3. Rectocele. 4. Status post total vaginal hysterectomy, anterior and posterior repair. 5. Status post tension-free vaginal tape.  DISCHARGE MEDICATIONS: 1. Percocet 1-2 tablets p.o. every 4 hours p.r.n. 2. Motrin 600 mg p.o. every 6 hours. 3. Vagifem 25 mg 1 per vagina q.h.s. beginning in 1 week.  HOSPITAL COURSE:  The patient is a 75 year old, G4, P3, who was admitted for a scheduled vaginal hysterectomy and possible oophorectomy and anterior and posterior repair with a tension-free vaginal tape to be performed by Loraine Leriche C. Vernie Ammons, M.D.  She underwent this surgery without difficulty and was admitted for routine postoperative care.  On postoperative day #1, the patient did have some nausea and vomiting; however, her pain was well-controlled, and her nausea improved with Zofran.  She also had a slow return of her bowel function until postop day #2 not passing any flatus; however, was able to have a small bowel movement prior to discharge with a Dulcolax suppository.  She was tolerating a regular diet, was afebrile with stable vital signs and was felt stable for discharge on postop day #2.  Therefore, she was discharged to home to follow up in two weeks for a GYN exam. Dictated by:   Huel Cote, M.D. Attending Physician:  Oliver Pila DD:  03/04/01 TD:  03/05/01 Job: [id number]

## 2010-08-29 NOTE — H&P (Signed)
[facility]  Patient:    [name], [name] Visit Number: [id number] MRN: [id number]          Service Type: Attending:  Alvino Chapel, M.D. Dictated by:   Alvino Chapel, M.D. Proc. Date: 01/21/01 Adm. Date:  01/31/01                           History and Physical  HISTORY OF PRESENT ILLNESS:  The patient is a 75 year old, G4, P3, who is admitted for scheduled vaginal hysterectomy, possible oophorectomy, anterior posterior repair, and tension-free vaginal tape to be performed by Loraine Leriche C. Vernie Ammons, M.D.  The patient has had a long history of symptoms with pelvic prolapse, including increasing constipation related to a rectocele, as well as stress urinary incontinence with her cystocele.  She also has a constant feeling of pressure and desires definitive surgical treatment.  PAST MEDICAL HISTORY: 1. Hypercholesterolemia. 2. Gastroesophageal reflux. 3. Osteoporosis.  PAST SURGICAL HISTORY: 1. Cholecystectomy. 2. Thyroidectomy, 3. Appendectomy. 4. Neck surgery.  PAST GYNECOLOGICAL HISTORY:  No abnormal Pap smears.  PAST OBSTETRICAL HISTORY:  She has a history of three normal spontaneous vaginal deliveries, the largest being 8 pounds 3 ounces, and one miscarriage. The patient has been postmenopausal for approximately 10 years and does not take hormone replacement therapy.  MEDICATIONS:  Prilosec, Lipitor, and Evista.  FAMILY HISTORY:  No breast cancer, heart disease, or colon cancer.  PHYSICAL EXAMINATION:  The patient is 5 feet 2 inches and 151 pounds.  HEART:  Regular rate and rhythm.  LUNGS:  Clear.  ABDOMEN:  Soft and nontender.  BREASTS:  Normal with no masses, discharge, or adenopathy noted.  PELVIC:  External genitalia are slightly atrophic.  The cervix does have some prolapse noted.  The uterus is normal size.  The adnexa have no significant masses.  There is a second degree cystocele and a moderate second or third degree  rectocele present.  Given the patients obvious stress urinary incontinence and other symptoms of pelvic prolapse, we have decided to proceed with a vaginal hysterectomy, anterior posterior repair, and tension-free vaginal tape.  She also wishes the removal of her ovaries should this be possible vaginally and easily approachable.  The risks and benefits of the procedure were discussed with the patient, including bleeding, infection, and possible damage to bowel and bladder.  She also understands the possible need to convert to an open procedure should any of these problems exist. Dictated by:   Alvino Chapel, M.D. Attending:  Alvino Chapel, M.D. DD:  01/21/01 TD:  01/22/01 Job: [id number]

## 2010-08-29 NOTE — Op Note (Signed)
[place]  Patient:    [name]                           MRN: [id number] Proc. Date: 11/11/99 Adm. Date:  [id number] Attending:  Donn Pierini                           Operative Report  PREOPERATIVE DIAGNOSIS:  C5-6 spondylosis with stenosis.  POSTOPERATIVE DIAGNOSIS:  C5-6 spondylosis with stenosis.  PROCEDURE:  C5-6 anterior cervical diskectomy and fusion with allograft and anterior plate instrumentation.  SURGEON:  Julio Sicks, M.D.  ASSISTANT:  Reinaldo Meeker, M.D.  ANESTHESIA:  General endotracheal.  INDICATIONS:  [name] is a 75 year old female with history of neck and bilateral upper extremity pain, left greater than right.  She has a history of paresthesias and numbness involving both distal upper extremities consistent with early cervical myelopathy.  She has hyperreflexia on examination.  MRI scan demonstrates multiple level spondylosis worse at C5-6 level with a central disk herniation and spinal cord compression.  She has bilateral neural foraminal narrowing also present at this level.  Patient has been counseled as to her options.  She has decided to proceed with a C5-6 anterior cervical diskectomy and fusion with allograft and anterior plate instrumentation for hopeful improvement of her symptoms.  OPERATIVE NOTE:  Patient taken to the operating room, placed on operating table in supine position.  After adequate level of anesthesia achieved, patient positioned supine with her neck slightly extended and held in place with 5 pounds of halter traction.  Patients anterior cervical region was shaved and prepped sterilely and #10 blade was used to make a linear skin incision extending from just right of midline to the medial border of the sternocleidomastoid muscle on the right.  This was carried down sharply to the platysma.  The platysma was then elevated and divided vertically.  Dissection proceeded along the medial  border of the sternocleidomastoid muscle and carotid sheath on the right.  Trachea and esophagus were mobilized and retracted towards the left.  Prevertebral fascia was stripped off the anterior spinal column.  Longus colli muscle was then elevated bilaterally using electrocautery.  Deep self-retaining retractor was placed.  Intraoperative fluoroscopy was used and the C5-6 level was confirmed.  The disk space was then incised with a 15 blade in a rectangular fashion. A wide disk space cleanout was then achieved using pituitary rongeurs, forward and backward Carlens curets, Kerrison rongeurs and the high-speed drill.  All elements of the disk were removed down to the level of the posterior annulus.  Microscope was brought into the field and used throughout the remainder of the diskectomy.  Remaining aspects of the annulus and osteophytes were removed down to the level of the posterior longitudinal ligament using the high-speed drill.  The posterior longitudinal ligament was then elevated and resected in a piecemeal fashion using Kerrison rongeurs.  The underlying thecal sac was exposed.  A wide central decompression was then performed by undercutting the posterior aspects of the body of C5 and C6.  Decompression then proceeded out into each neural foramen.  The C6 nerve roots were identified proximally and widely decompressed using Kerrison rongeurs.  At this point, blunt probes passed easily both superiorly, inferiorly and out each foramen.  There was no evidence of any residual stenosis.  Wound was then copiously irrigated with antibiotic solution.  Gelfoam was placed topically for hemostasis, which was found to be good.  Disk space then distracted and a 7 mm fibula wedge allograft was then impacted into place and recessed approximately 1 mm from the anterior cortical surface.  Disk space spreader was removed, as was the microscope.  A 23 mm Atlantis anterior cervical plate was then placed  at the C5-6 level.  It was then attached to the C5 and C6 bodies by drilling pilot holes under fluoroscopic guidance, tapping the pilot holes and then subsequently placing cancellous, fixed-angled screws.  At C5 on the right side, a 13 mm screw was placed.  At C5 on the left side, a 12 mm screw was placed.  At C6 bilaterally, 13 mm screws were placed.  Locking caps were engaged.  There was no evidence of complication.  Final images revealed good position of the bone graft and the hardware at the proper operative level with normal alignment of the spine.  Wound was then copiously irrigated with antibiotic solution.  It was inspected for hemostasis, which was found to be good.  Platysma was then reapproximated with 3-0 Vicryl suture.  Skin was reapproximated with 5-0 PDS in an inverted subcuticular fashion.  Steri-Strips and a sterile dressing were applied.  There were no apparent complications. Patient tolerated the procedure well and she returned to recovery room postoperatively. DD:  11/11/99 TD:  11/11/99 Job: [id number]

## 2010-08-29 NOTE — Group Therapy Note (Signed)
MEDICAL RECORD NUMBER:  [id number].   [name] is a 75 year old married white female who is accompanied by her  husband today for an evaluation. She has complaints of bilateral shoulder  pain and chronic low back and leg pain.   Pain is getting to the point where she is having trouble doing what she  would like to do. Her average pain is about an 8 on a scale of 10. She  describes as sharp, stabbing, aching, tingling. Sleep has been poor. Pain is  worse with walking and sitting and improves with rest, heat, ice, pacing,  and medications. She is getting a little relief with her medicines at this  time. She has been taking Mepergan Fortis once a day since January. She also  takes some Skelaxin and Prozac.   She is able to walk without assistance. She can walk about 25 minutes at a  time. She is able to climb stairs, and she drives. She has not been employed  since 1993. She is independent with all of her self-care including feeding,  dressing, bathing, toileting, meal prep, and shopping. She does need some  assistance with household duties. She denies any problems with bowel or  bladder, depression, anxiety, or suicidal thoughts.   REVIEW OF SYSTEMS:  Positive for limb swelling, sleep apnea and CPAP,  abdominal pain, constipation, reflux, skin rash, fever, chills, weight gain,  and night sweats. I asked her to followup with her PCP for complaints not  related to her chief complaint. Primary care physician is [name], and  she has recently seen [name], the neurosurgeon. She admits thyroid  problems, intestinal problems, reflux disease as well as arthritis.   PAST SURGICAL HISTORY:  Remarkable for neck surgery, colon and bladder  surgery, and hernia surgery.   SOCIAL HISTORY:  The patient lives with husband. She has been married for 33  years. Denies alcohol use. Denies smoking. Denies illegal drug use.   FAMILY HISTORY:  Negative for heart disease, lung disease, diabetes,  high  blood pressure, psychiatric problems.   PHYSICAL EXAMINATION:  Blood pressure 186/81, pulse 89, respirations 20, 99%  saturated on room air. She is a well-developed, mildly obese female in  minimal to no distress. Oriented x3. Affect overall bright and alert,  cooperative and pleasant. She is able to independently stand after being  seated. She has an obvious limp and appears quite stiff when she stands up.  She has limitations in lumbar motion in all planes. Seated reflexes are 1+  at the right patellar tendon, 2+ at the left patellar tendon, 0 at the right  Achilles tendon, and 2+ at the left Achilles tendon. Motor strength is 5/5  with the exception of slightly weak left EHL, patchy deficits in the left  lower extremity. She has tenderness with palpation over the lumbosacral  junction mainly on the left.  HEART:  Regular rhythm.  LUNGS:  Clear.  ABDOMEN:  Benign, flat. Bowel sounds positive.   The patient brings in a myelogram which was done April 28, 2004. She has  multilevel degenerative spondylotic changes with multiple sites of potential  neural compromise and most impressive findings at L5-S1 and L4-5, and there  was a lateral disk herniation on the right at L2-3. Cervical spine shows  solid appearing fusion at C5-6. No neural compromise. Mild foraminal  stenotic changes at few sites but no significant appearing stenosis. Widely  patent central canal in the cervical spine.   IMPRESSION:  Lumbar spondylosis with  sciatic symptoms.   PLAN:  At L4-5, there appears to mass effect on the L5 nerve root,  particularly on the left.   Would like to have her set up for a transforaminal left L4-5 epidural  steroid injection. Will put her on Ultracet 1 p.o. q.8h. #90 and add  Lidoderm 5% 1 to 3 patches 12 hours on and 12 hours off. Physical therapy  education on body mechanics and proper posture, possibly TENS unit. She also  will need a cane if there is not much improvement. We  will see her back in a  month.      DMK/MedQ  D:  07/09/2004 17:39:20  T:  07/09/2004 20:21:55  Job #:  [id number]

## 2010-08-29 NOTE — Op Note (Signed)
[facility]  Patient:    [name], [name] Visit Number: [id number] MRN: [id number]          Service Type: GYN Location: 4W 0456 02 Attending Physician:  Oliver Pila Proc. Date: 01/31/01 Admit Date:  01/31/2001   CC:         Alvino Chapel, M.D.   Operative Report  PREOPERATIVE DIAGNOSIS:  Stress urinary incontinence.  POSTOPERATIVE DIAGNOSIS:  Stress urinary incontinence.  PROCEDURE:  Transvaginal tape.  SURGEON:  Mark C. Vernie Ammons, M.D.  ANESTHESIA:  General.  DRAINS:  16 French Foley catheter.  ESTIMATED BLOOD LOSS:  Less than 5 cc.  SPECIMENS:  None.  COMPLICATIONS:  None.  INDICATIONS:  The patient is a 75 year old white female, who initially presented to my office with a diagnosis of a cystocele that had become worse. She has had no irritative voiding symptoms, urgency, or urge incontinence but had classic stress incontinence that occurred with coughing and sneezing and wanted further evaluation and treatment of that.  In her history is an episode of gross hematuria that was not worked up previously and not associated with a UTI.  I found on physical exam that she had an obvious moderate grade cystocele with the bladder full and noted it bulged nearly to the introitus with Valsalva.  She loss of ureterovesical junction support and a mild to moderate rectocele.  Her hematuria work-up consisted of an IVP that revealed normal upper tracts and cystoscopy that was negative.  She therefore is brought to the OR for treatment of her stress urinary incontinence.  The risks, complications, alternatives, and limitations have been discussed.  DESCRIPTION OF OPERATION:  After informed consent, the patient brought to the major OR, placed on the table, and administered general anesthesia then moved to the dorsal lithotomy position.  Dr. Senaida Ores then performed transvaginal hysterectomy and anterior repair.  This was dictated by her  separately.  I used the upper portion of her incision and dissected laterally around the mid urethral region.  In doing so, I noted this location by palpating the Foley catheter pulled against the bladder neck.  I then made two stab incisions in the symphysis pubis in the lower abdomen and placed a cystoscope in the bladder.  I then moved the urethra to the contralateral side and passed the abdominal passing needle through the suprapubic stab incision lateral to the bladder, which was completely drained, and out through the urethra at the mid urethral level.  I then performed cystoscopy with the 70 degree lens, again noting no lesions within the bladder and no injuries or perforations identified.  This was performed on the contralateral side as well and with again, cystoscopy confirming no injury.  I then attached the tape to the abdominal passing needles and brought this up through the anterior abdominal wall.  I then replaced the cystoscope sheath with the 16 French Foley catheter and placed a 24 French sound beneath the urethra and removed the sheath from the tape on both sides with no tension being placed on the tape.  This was reconfirmed visually.  I then cut off the excess tape and closed the suprapubic incisions with Dermabond.  Dr. Senaida Ores finished with closure of the vagina and posterior repair as dictated by her.  There were no complications and blood loss was less than 5 cc.  The patient tolerated the procedure well.  Her catheter will be maintained overnight because of her stay and begin voiding trial in the morning. Attending Physician:  Oliver Pila DD:  01/31/01 TD:  01/31/01 Job: [id number]

## 2010-08-29 NOTE — Op Note (Signed)
NAMESHEENAH, [name]                             ACCOUNT NO.:  [id number]   MEDICAL RECORD NO.:  [id number]                   PATIENT TYPE:  AMB   LOCATION:  DAY                                  FACILITY:  [facility]   PHYSICIAN:  Barbaraann Barthel, M.D.              DATE OF BIRTH:  17-Jan-1936   DATE OF PROCEDURE:  09/20/2002  DATE OF DISCHARGE:                                 OPERATIVE REPORT   SURGEON:  Barbaraann Barthel, M.D.   PREOPERATIVE DIAGNOSIS:  Umbilical hernia.   POSTOPERATIVE DIAGNOSIS:  Umbilical hernia.   PROCEDURE:  Umbilical herniorrhaphy.   INDICATIONS FOR PROCEDURE:  This is a 75 year old Latin American female who  presented with periumbilical discomfort and an obvious clinical umbilical  hernia.  We discussed repair on an elective basis, as there was no  incarceration or other problems.  We discussed the complications, not  limited to but including bleeding, infection, and recurrence, and informed  consent was obtained.   SURGICAL SPECIMEN:  None.   GROSS OPERATIVE FINDINGS:  The patient had an umbilical hernia approximately  the size of a 25 cent piece of 50 cent piece with some sliding omentum  within it.  No other abnormalities were palpated.   TECHNIQUE:  The patient was placed in the supine position.  After the  adequate administration of general anesthesia via endotracheal intubation,  her entire abdomen somewhat prepped with Betadine solution and draped in the  usual manner.   A periumbilical incision was carried out on the inferior aspect of the  umbilicus.  The skin and subcutaneous tissue was incised down to the fascia.  The umbilical skin was dissected free from a very small attenuated hernia  sac.  The fascia was cleared up around the circumference of the fascial  defect enough to allow closure with interrupted 0 Prolene sutures in a  figure-of-eight fashion.  The defect was closed transversely.  The wound was  then irrigated with normal saline  solution.  The regular concave appearance  of the umbilicus was then restored with a 4-0 Polysorb suture, suturing the  umbilicus to the fascia, and then the subcutaneous tissue was closed  likewise with 4-0 Polysorb.  Cosmetic subcuticular closure with 5-0 Polysorb  was obtained, and Steri-Strips were further used to approximate the  skin, with Neosporin and 4 x 4 dressing applied.  Prior to closure, all  sponge, needle, and instrument counts were found to be correct.  Estimated  blood loss was minimal.  No drains were placed.  The patient received 1200  cc of crystalloid intraoperatively.                                               Barbaraann Barthel, M.D.    WB/MEDQ  D:  09/20/2002  T:  09/20/2002  Job:  [id number]   cc:   Charlesetta Shanks  [address]  [place] [address]  Fax: [phone]

## 2010-11-19 ENCOUNTER — Other Ambulatory Visit: Payer: Self-pay | Admitting: *Deleted

## 2010-11-19 MED ORDER — OMEPRAZOLE 20 MG PO CPDR: 20.0000 mg | DELAYED_RELEASE_CAPSULE | Freq: Every day | ORAL | Status: DC

## 2010-12-18 ENCOUNTER — Other Ambulatory Visit: Payer: Self-pay | Admitting: *Deleted

## 2010-12-18 MED ORDER — SIMVASTATIN 40 MG PO TABS: 40.0000 mg | ORAL_TABLET | Freq: Every day | ORAL | Status: DC

## 2011-01-28 LAB — LIPID PANEL
Cholesterol: 170
HDL: 33 — ABNORMAL LOW

## 2011-01-28 LAB — CBC
Hemoglobin: 13.1
MCHC: 34.5
MCV: 87.8
MCV: 89.2
Platelets: 240
RBC: 4.34
RDW: 12.7
WBC: 5.9
WBC: 7.9

## 2011-01-28 LAB — DIFFERENTIAL
Basophils Absolute: 0
Eosinophils Absolute: 0.2
Eosinophils Relative: 3

## 2011-01-28 LAB — COMPREHENSIVE METABOLIC PANEL
ALT: 29
AST: 21
Alkaline Phosphatase: 64
CO2: 27
Chloride: 106
GFR calc Af Amer: >60
GFR calc non Af Amer: >60
Sodium: 140
Total Bilirubin: 0.6

## 2011-01-28 LAB — BASIC METABOLIC PANEL
Chloride: 105
Creatinine, Ser: 0.6
GFR calc Af Amer: >60
Potassium: 4.4

## 2011-01-28 LAB — CARDIAC PANEL(CRET KIN+CKTOT+MB+TROPI)
CK, MB: 1
CK, MB: 1.2
Relative Index: INVALID
Troponin I: 0.03

## 2011-01-28 LAB — D-DIMER, QUANTITATIVE: D-Dimer, Quant: <0.22

## 2011-07-21 ENCOUNTER — Encounter: Payer: Self-pay | Admitting: Neurology

## 2011-09-21 ENCOUNTER — Encounter: Payer: Self-pay | Admitting: Neurology

## 2011-09-21 ENCOUNTER — Ambulatory Visit (INDEPENDENT_AMBULATORY_CARE_PROVIDER_SITE_OTHER): Payer: Medicare Other | Admitting: Neurology

## 2011-09-21 VITALS — BP 122/72 | HR 64 | Wt 158.0 lb

## 2011-09-21 DIAGNOSIS — M5410 Radiculopathy, site unspecified: Secondary | ICD-10-CM

## 2011-09-21 DIAGNOSIS — IMO0002 Reserved for concepts with insufficient information to code with codable children: Secondary | ICD-10-CM

## 2011-09-21 MED ORDER — GABAPENTIN 100 MG PO CAPS: ORAL_CAPSULE | ORAL | Status: DC

## 2011-09-21 NOTE — Progress Notes (Signed)
Dear Dr. Caryn Bee,  Thank you for having me see [name] in consultation today at [facility] for her problem with diffuse body pain.  As you may recall, she is a 76 y.o. year old female with a history of "fibromyalgia", CDF C5-C6, lumbar spondylosis, who complains of diffuse pain particularly in her bilateral legs and bilateral shoulders.  She focuses on her right leg pain, that is worse with sitting, and "shoots" from her right buttock down her leg.  She denies numbness.  It is worse with movement.  She also gets a similar pain in her left leg although it is not as severe.  She continues to get pain in her neck.  This is a tingling pain at the base of her neck as well as pain on the bilateral aspect.  Before her surgery it used to radiate into her bilateral shoulders, but remitted for about 6 years before it recurred, although now it is more isolated to her neck.  She also complains of numbness in her right hand, worse in her middle finger.  She does not   She denies urinary incontinence, although has had bladder surgery previously.  She has difficulty walking because of pain in her bilateral legs.  She was placed on Cymbalta but had problems with her vision secondary to it.  She has not been on Lyrica or gabapentin.  She has never been on a tricyclic.  The details of her previous surgery are hazy.  From previous imaging it looks like she had an ACDF of C5-C6.  A CT myelogram of her lumbar spine in 12/2007 revealed bilateral L5 foraminal stenosis.  A C-spine myelogram revealed a left moderate C4 foraminal stenosis as well as a possible left C2-C3 foraminal stenosis.  She uses heat and ibuprofen for her pain.  Past Medical History  Diagnosis Date  . Hypertension   . Heart murmur     Dr Eden Emms is patient's Cardiologist.   . GERD (gastroesophageal reflux disease)   . Hyperlipidemia   . Sleep apnea   . Diverticular disease   . Fibromyalgia   . CAD (coronary artery disease)   . Bronchitis    . Hemorrhoids   . Chronic headaches   . Bruises easily   . Breast pain   . History of kidney stones   . History of blurred vision   . Joint pain     Past Surgical History  Procedure Date  . Neck surgery 10 years ago  . Laparoscopic cholecystectomy 20 years ago  . Hernia repair 6 years ago  . Bladder repair 8 years ago    History   Social History  . Marital Status: Married    Spouse Name: N/A    Number of Children: N/A  . Years of Education: N/A   Social History Main Topics  . Smoking status: Never Smoker   . Smokeless tobacco: Never Used  . Alcohol Use: Yes     once in a while  . Drug Use: No  . Sexually Active: None   Other Topics Concern  . None   Social History Narrative  . None    Family History: no significant neurologic disease.  Current Outpatient Prescriptions on File Prior to Visit  Medication Sig Dispense Refill  . ALOE VERA PO Take by mouth.        . Ascorbic Acid (VITAMIN C) 1000 MG tablet Take 1,000 mg by mouth daily.        Marland Kitchen aspirin 81  MG tablet Take 81 mg by mouth daily.        . Calcium-Vitamin D 600-125 MG-UNIT TABS Take by mouth.        . Cholecalciferol (VITAMIN D3) 1000 UNITS CAPS Take by mouth.        . Clobetasol Prop Emollient Base (CLOBETASOL PROPIONATE E) 0.05 % emollient cream Apply topically 2 (two) times daily.        . Cyanocobalamin (VITAMIN B-12) 2500 MCG SUBL Place under the tongue.        . hydrocortisone 2.5 % cream APPLY TO AFFECTED AREA TWICE DAILY  30 g  0  . metoprolol (LOPRESSOR) 100 MG tablet Take 100 mg by mouth 2 (two) times daily.        Marland Kitchen omeprazole (PRILOSEC) 20 MG capsule Take 1 capsule (20 mg total) by mouth daily.  30 capsule  5  . pravastatin (PRAVACHOL) 40 MG tablet Take 40 mg by mouth daily.      . vitamin E 400 UNIT capsule Take 400 Units by mouth daily.        Marland Kitchen VITAMIN K PO Take by mouth.          Allergies  Allergen Reactions  . Latex       ROS:  13 systems were reviewed and are notable for  vertigo - she had an acute attack of vertigo several months ago that was short in duration(lasting seconds) and precipitated by head movement.  It remitted after two days and only rarely effects her with feeling of imbalance.  All other review of systems are unremarkable.   Examination:  Filed Vitals:   09/21/11 1318  BP: 122/72  Pulse: 64  Weight: 158 lb (71.668 kg)     In general, well appearing women.  H&N:  mild tenderness C7 spinous process.  + tenderness bilateral paraspinal muscles of neck.  Back: paraspinal tenderness as well as spinous tenderness, no sciatic notch tenderness.  Hip: some mild discomfort to passive IR/ER of right hip.  Precipitating maneuvers:  - tinel's at elbow and tinel's at wrist.  ?+- SLR on right.  Cardiovascular: The patient has a regular rate and rhythm and no carotid bruits, but radiating systolic bruit on left.  Fundoscopy:  Disks are flat. Vessel caliber within normal limits.  Mental status:   The patient is oriented to person, place and time. Recent and remote memory are intact. Attention span and concentration are normal. Language including repetition, naming, following commands are intact. Fund of knowledge of current and historical events, as well as vocabulary are normal.  Cranial Nerves: Pupils are equally round and reactive to light. Visual fields full to confrontation. Extraocular movements are intact without with end gaze nystagmus that is moderate but attenuates. Facial sensation and muscles of mastication are intact. Muscles of facial expression are symmetric. Hearing intact to bilateral finger rub. Tongue protrusion, uvula, palate midline.  Shoulder shrug intact  Motor:  The patient has normal bulk and tone, no pronator drift.  There are no adventitious movements.  5/5 muscle strength bilaterally except at bilateral hip flexors 4+/5  Reflexes:   Biceps  Triceps Brachioradialis Knee Ankle  Right 2+  2+  2+   2+ 0  Left   2+  2+  2+   2+ 0  Toes down  Coordination:  Normal finger to nose.  No dysdiadokinesia.  Sensation is decreased to temp and vibration in a length dep, but non dermatomal pattern, position intact.  Gait and Station are normal..  Romberg is mildly positive.   Impression/Recs:  I am concerned about a radiculopathy being a generator of some of her C-spine and L-spine pain.  I am going to get an MRI of her C-spine and L-spine as well as an EMG/NCS of her right upper and lower extremity which seems to be the worse.  She may have a CTS on the right as well.  I would likely send her for ESI to see if this helps if she has an obvious lumbar lesion.  I have started her on a low dose of gabapentin to increase to 200 tid if tolerated.   We will see the patient back in 2 months.  Thank you for having [place] see [name] in consultation.  Feel free to contact me with any questions.  Lupita Raider Modesto Charon, MD [facility], [place] [address] [place], [place] [address] Phone: [phone] Fax: [phone].

## 2011-09-21 NOTE — Patient Instructions (Addendum)
Neurontin( generic name gabapentin) 100mg  capsules  Take 1 capsule at night for 5 days, then 1 capsule two times per day for 5 days, and then 1 capsule three times a day for 5 days, then 1 capsule in the a.m., 1 at midday, and 2 at night for 5 days, then 2 caps in the a.m., 1 at midday, 2 at night for 5 days, then 2 caps three times a day from then on.   Your NCV/EMG is scheduled at [facility] located at [address] in [place] on Friday, June 21st at 11:15 am.  Please arrive 15 minutes prior to your scheduled appointment. [phone]    Your MRI is scheduled at the [facility] right beside [facility]. The address is [address] [place]. Your appointment is Saturday, June 15th at 9:00 am. Please arrive 15 minutes prior to your scheduled appointment. [phone].

## 2011-09-26 ENCOUNTER — Ambulatory Visit (HOSPITAL_COMMUNITY)
Admission: RE | Admit: 2011-09-26 | Discharge: 2011-09-26 | Disposition: A | Discharge status: Home / Self Care | Payer: Medicare Other | Source: Ambulatory Visit | Attending: Neurology | Admitting: Neurology

## 2011-09-26 DIAGNOSIS — M5410 Radiculopathy, site unspecified: Secondary | ICD-10-CM

## 2011-09-26 DIAGNOSIS — N289 Disorder of kidney and ureter, unspecified: Secondary | ICD-10-CM | POA: Insufficient documentation

## 2011-09-26 DIAGNOSIS — M4120 Other idiopathic scoliosis, site unspecified: Secondary | ICD-10-CM | POA: Insufficient documentation

## 2011-09-26 DIAGNOSIS — M545 Low back pain, unspecified: Secondary | ICD-10-CM | POA: Insufficient documentation

## 2011-09-26 DIAGNOSIS — Q762 Congenital spondylolisthesis: Secondary | ICD-10-CM | POA: Insufficient documentation

## 2011-09-26 DIAGNOSIS — M47817 Spondylosis without myelopathy or radiculopathy, lumbosacral region: Secondary | ICD-10-CM | POA: Insufficient documentation

## 2011-09-26 DIAGNOSIS — M47812 Spondylosis without myelopathy or radiculopathy, cervical region: Secondary | ICD-10-CM | POA: Insufficient documentation

## 2011-09-26 DIAGNOSIS — M542 Cervicalgia: Secondary | ICD-10-CM | POA: Insufficient documentation

## 2011-09-26 DIAGNOSIS — R220 Localized swelling, mass and lump, head: Secondary | ICD-10-CM | POA: Insufficient documentation

## 2011-09-26 DIAGNOSIS — R221 Localized swelling, mass and lump, neck: Secondary | ICD-10-CM | POA: Insufficient documentation

## 2011-09-26 DIAGNOSIS — Z981 Arthrodesis status: Secondary | ICD-10-CM | POA: Insufficient documentation

## 2011-09-28 ENCOUNTER — Telehealth: Payer: Self-pay | Admitting: Neurology

## 2011-09-28 ENCOUNTER — Other Ambulatory Visit: Payer: Self-pay | Admitting: Neurology

## 2011-09-28 DIAGNOSIS — R937 Abnormal findings on diagnostic imaging of other parts of musculoskeletal system: Secondary | ICD-10-CM

## 2011-09-28 DIAGNOSIS — R9389 Abnormal findings on diagnostic imaging of other specified body structures: Secondary | ICD-10-CM

## 2011-09-28 NOTE — Telephone Encounter (Signed)
Message copied by Benay Spice on Mon Sep 28, 2011 10:43 AM ------      Message from: Milas Gain      Created: Mon Sep 28, 2011  9:33 AM       Please give [name] a call and tell her we need to get another study of her neck to look at a growth in her neck.  Could you order a CT neck(not C-spine) with contrast.  Thx.

## 2011-09-28 NOTE — Telephone Encounter (Signed)
Spoke with the patient. CT neck with contrast sch for Wednesday, June 19th at 1200: arrive at 1100 am for lab work. The patient understands.

## 2011-09-28 NOTE — Telephone Encounter (Signed)
This will be done at [facility]. Pt aware.

## 2011-09-30 ENCOUNTER — Ambulatory Visit (HOSPITAL_COMMUNITY)
Admission: RE | Admit: 2011-09-30 | Discharge: 2011-09-30 | Disposition: A | Discharge status: Home / Self Care | Payer: Medicare Other | Source: Ambulatory Visit | Attending: Neurology | Admitting: Neurology

## 2011-09-30 ENCOUNTER — Telehealth: Payer: Self-pay | Admitting: Neurology

## 2011-09-30 DIAGNOSIS — M25519 Pain in unspecified shoulder: Secondary | ICD-10-CM | POA: Insufficient documentation

## 2011-09-30 DIAGNOSIS — M79609 Pain in unspecified limb: Secondary | ICD-10-CM | POA: Insufficient documentation

## 2011-09-30 DIAGNOSIS — R937 Abnormal findings on diagnostic imaging of other parts of musculoskeletal system: Secondary | ICD-10-CM | POA: Insufficient documentation

## 2011-09-30 DIAGNOSIS — Z981 Arthrodesis status: Secondary | ICD-10-CM | POA: Insufficient documentation

## 2011-09-30 LAB — BASIC METABOLIC PANEL
BUN: 16 mg/dL (ref 6–23)
CO2: 31 mEq/L (ref 19–32)
GFR calc non Af Amer: 85 mL/min — ABNORMAL LOW (ref >90)
Glucose, Bld: 96 mg/dL (ref 70–99)
Potassium: 5.2 mEq/L — ABNORMAL HIGH (ref 3.5–5.1)

## 2011-09-30 MED ORDER — IOHEXOL 300 MG/ML  SOLN
100.0000 mL | Freq: Once | INTRAMUSCULAR | Status: AC | PRN
  Administered 2011-09-30: 100 mL via INTRAVENOUS

## 2011-09-30 NOTE — Telephone Encounter (Signed)
Spoke with the patient. C/io worsening pain and swelling in her rt lower leg and rt knee. Started this morning when she got up. Dose hurt all over but worse in her legs. Seen by Dr. Modesto Charon on June 10th with these same symptoms, but now worse. She is requesting something "a little stronger for the pain." She reports that the pain is so bad she feels a little sick to her stomach at times. She wants Dr. Modesto Charon to let her know about the results of her CT neck when available. Is having the NCV/EMG on Friday. I let her know that Dr. Modesto Charon was out of the office until tomorrow and that I would let her know something once I heard back from him. She is ok with this plan. Will take Aleve/Advil for symptoms. **Dr. Modesto Charon, please advise. Thank you.

## 2011-09-30 NOTE — Telephone Encounter (Signed)
Pt's right leg and knee are swollen. She also states that her arms hurt. She would like to discuss this with a nurse. Please call cell phone number

## 2011-10-01 ENCOUNTER — Other Ambulatory Visit: Payer: Self-pay | Admitting: Neurology

## 2011-10-01 MED ORDER — TRAMADOL HCL 50 MG PO TABS: 50.0000 mg | ORAL_TABLET | Freq: Four times a day (QID) | ORAL | Status: AC | PRN

## 2011-10-01 NOTE — Telephone Encounter (Signed)
we can try tramadol if she has not used that 50mg  q6h prn, disp 30 tabs, no refills.

## 2011-10-01 NOTE — Telephone Encounter (Signed)
Med e-scribed to [facility] in [place]. Patient aware.

## 2011-10-05 ENCOUNTER — Other Ambulatory Visit: Payer: Self-pay | Admitting: Neurology

## 2011-10-05 ENCOUNTER — Telehealth: Payer: Self-pay | Admitting: Neurology

## 2011-10-05 DIAGNOSIS — M79604 Pain in right leg: Secondary | ICD-10-CM

## 2011-10-05 NOTE — Telephone Encounter (Signed)
Message copied by Benay Spice on Mon Oct 05, 2011  2:21 PM ------      Message from: Milas Gain      Created: Mon Oct 05, 2011  1:05 PM       Let Ms. Beharry know that the CT of the neck did not show any mass.  It is felt that the MRI was incorrect(artifact) as CT is a better test.  The MRI of her neck and lower back did show multiple areas where arthritis could be pushing on some nerve roots.  I would suggest trying to [facility] of her lower back first to see if we can help her right leg pain.  We can do this before her EMG/NCS.  I would set her up for right L4 and right L5 ESI.  She can let us know if this helps.

## 2011-10-05 NOTE — Telephone Encounter (Signed)
Spoke with [employer]. Information given as below re: MRI/CT results. The patient wishes to go forward with the ESI. I told her that I would set that up and call her with the appt date and time. Explained nothing to eat 4 hors prior to the test and that she will need a driver. No further questions. She did say her leg was better since on the pain medication. She has also had her NCV/EMG study and that was done on Friday. I then called [facility] and left a message to return my call re: appt for the [facility].

## 2011-10-05 NOTE — Telephone Encounter (Signed)
Called to let the patient know that the [facility] scheduled at [facility] on Monday, July 1st at 10:15 am. Aware that she will need a driver and nothing to eat 4 hours prior to the procedure; liquids ok. The patient understands. Phone number also given in case she needs directions and/or to reschedule. No other questions or concerns at this time.

## 2011-10-12 ENCOUNTER — Ambulatory Visit
Admission: RE | Admit: 2011-10-12 | Discharge: 2011-10-12 | Disposition: A | Discharge status: Home / Self Care | Payer: Medicare Other | Source: Ambulatory Visit | Attending: Neurology | Admitting: Neurology

## 2011-10-12 ENCOUNTER — Telehealth: Payer: Self-pay | Admitting: Neurology

## 2011-10-12 VITALS — BP 146/67 | HR 55

## 2011-10-12 DIAGNOSIS — M5126 Other intervertebral disc displacement, lumbar region: Secondary | ICD-10-CM

## 2011-10-12 DIAGNOSIS — M79604 Pain in right leg: Secondary | ICD-10-CM

## 2011-10-12 MED ORDER — METHYLPREDNISOLONE ACETATE 40 MG/ML INJ SUSP (RADIOLOG
120.0000 mg | Freq: Once | INTRAMUSCULAR | Status: AC
  Administered 2011-10-12: 120 mg via EPIDURAL

## 2011-10-12 MED ORDER — IOHEXOL 180 MG/ML  SOLN
1.0000 mL | Freq: Once | INTRAMUSCULAR | Status: AC | PRN
  Administered 2011-10-12: 1 mL via EPIDURAL

## 2011-10-12 NOTE — Telephone Encounter (Signed)
Denton Meek, MD 10/12/2011 10:25 AM Signed  EMG/NCS revealed moderate left and mod to severe right median neuropathies at wrist. No right cervical or LS radics.  Jan - can you let the patient know that she had bilateral CTS on NCS but otherwise her EMG looked good. For her CTS I would suggest wrist CT splints at night if she has not tried this. Otherwise we may need to send her to a hand specialist.  Called and spoke with [facility]. Information given as directed by Dr. Modesto Charon above. The patient states she is doing better. Had her ESI this am and tolerated that well. She states her hands are better as well and if they get worse she will try the wrist splints. She knows of her f/u appointment in August with Dr. Modesto Charon. Will call us if she needs [place] prior to her f/u.

## 2011-10-12 NOTE — Progress Notes (Signed)
EMG/NCS revealed moderate left and mod to severe right median neuropathies at wrist.  No right cervical or LS radics.  Jan - can you let the patient know that she had bilateral CTS on NCS but otherwise her EMG looked good.  For her CTS I would suggest wrist CT splints at night if she has not tried this.  Otherwise we may need to send her to a hand specialist.

## 2011-10-12 NOTE — Discharge Instructions (Signed)

## 2011-10-28 ENCOUNTER — Encounter: Payer: Self-pay | Admitting: Neurology

## 2011-11-23 ENCOUNTER — Encounter: Payer: Self-pay | Admitting: Neurology

## 2011-11-23 ENCOUNTER — Ambulatory Visit (INDEPENDENT_AMBULATORY_CARE_PROVIDER_SITE_OTHER): Payer: Medicare Other | Admitting: Neurology

## 2011-11-23 VITALS — BP 122/78 | HR 76 | Wt 161.0 lb

## 2011-11-23 DIAGNOSIS — M549 Dorsalgia, unspecified: Secondary | ICD-10-CM

## 2011-11-23 MED ORDER — TRAMADOL HCL 50 MG PO TABS: 50.0000 mg | ORAL_TABLET | Freq: Four times a day (QID) | ORAL | Status: AC | PRN

## 2011-11-23 MED ORDER — GABAPENTIN 300 MG PO CAPS: 300.0000 mg | ORAL_CAPSULE | Freq: Three times a day (TID) | ORAL | Status: DC

## 2011-11-23 NOTE — Progress Notes (Signed)
Dear Dr. Caryn Bee,  I saw  [name] back in [facility] for her problem with diffuse body pains.  As you may recall, she is a 76 y.o. year old female with a history of "fibromyalgia" CDF C5-C6, lumbar spondylosis who presented to me complaining of diffuse pain in her bilateral legs and shoulders.  At the time it seemed her right leg pain was worse shooting from her back to her foot.  An MRI C-spine revealed her previous ACDF at C5-C6 and adjacent segment disease at C4-C5 and C6-C7 with foraminal stenosis perhaps affecting the left C4 and the right C7 nerves. MRI L-spine revealed multilevel foraminal stenosis, include bilateral L5 and right L4.  EMG/NCS did show bilateral CTS but no cervical or lumbar radiculopathy.  However, ESI of L4- L5 actually gave her significant pain relief for over a month.  She also feels that the use of gabapentin 200mg  tid was helpful for the pain her upper extremities, likely from her bilateral CTS.  Unfortunately the pain in her bilateral lower extremities has returned.  It is similar as before.  She does not endorse any precipitating event.    Medical history, social history, and family history were reviewed and have not changed since the last clinic visit.  Current Outpatient Prescriptions on File Prior to Visit  Medication Sig Dispense Refill  . ALOE VERA PO Take by mouth.        . Ascorbic Acid (VITAMIN C) 1000 MG tablet Take 1,000 mg by mouth daily.        Marland Kitchen aspirin 81 MG tablet Take 81 mg by mouth daily.        . Calcium-Vitamin D 600-125 MG-UNIT TABS Take by mouth.        . Cholecalciferol (VITAMIN D3) 1000 UNITS CAPS Take by mouth.        . Clobetasol Prop Emollient Base (CLOBETASOL PROPIONATE E) 0.05 % emollient cream Apply topically 2 (two) times daily.        . Cyanocobalamin (VITAMIN B-12) 2500 MCG SUBL Place under the tongue.        . hydrocortisone 2.5 % cream APPLY TO AFFECTED AREA TWICE DAILY  30 g  0  . metoprolol (LOPRESSOR) 100 MG  tablet Take 100 mg by mouth 2 (two) times daily.        Marland Kitchen omeprazole (PRILOSEC) 20 MG capsule Take 1 capsule (20 mg total) by mouth daily.  30 capsule  5  . pravastatin (PRAVACHOL) 40 MG tablet Take 40 mg by mouth daily.      . vitamin E 400 UNIT capsule Take 400 Units by mouth daily.        Marland Kitchen VITAMIN K PO Take by mouth.        . DISCONTD: gabapentin (NEURONTIN) 100 MG capsule increase to 2 capsule three times per day as directed.  90 capsule  5    Allergies  Allergen Reactions  . Latex Dermatitis and Rash    "if extended exposure"    ROS:  13 systems were reviewed and are unremarkable.  Exam: . Filed Vitals:   11/23/11 1449  BP: 122/78  Pulse: 76  Weight: 161 lb (73.029 kg)    In general, well appearing women.   Motor:  Normal bulk and tone, no drift, full strength in UE.  In lower extremity, bilateral HF 4/5(but pain limited), KF/KE 4+/5, DF 5/5  Reflexes:  2+ thoughout UE, patella, 0 at ankles.   Gait:  Antalgic gait and station,  gets better with movement..    Back:  significant paraspinal spasm and tenderness, spinous process tenderness as well.  Impression/Recommendations:  1.  Back and leg pain - I think some of this is certainly MSK pain while some is due to mild nerve root irritation.  I have suggested that she get physical therapy for back strengthening exercises.  In addition, due to her response to the [facility] at right L4-L5 I am going to refer her to Dr. Vear Clock for possible consideration of further interventions.  In the meantime I am going to increase her gabapentin to 300 tid. 2.  Bilateral CTS - seems better on gabapentin and using splints at night.  Lupita Raider Modesto Charon, MD [facility], [place]

## 2011-11-23 NOTE — Patient Instructions (Addendum)
We will refer you for Physical Therapy. They will call you to schedule the appointment.  We will refer you to [facility] located at [address]. They will call you to schedule the appointment.   [phone]

## 2011-11-30 ENCOUNTER — Ambulatory Visit: Payer: Medicare Other | Attending: Neurology | Admitting: Physical Therapy

## 2011-11-30 DIAGNOSIS — IMO0001 Reserved for inherently not codable concepts without codable children: Secondary | ICD-10-CM | POA: Insufficient documentation

## 2011-11-30 DIAGNOSIS — R5381 Other malaise: Secondary | ICD-10-CM | POA: Insufficient documentation

## 2011-11-30 DIAGNOSIS — M545 Low back pain, unspecified: Secondary | ICD-10-CM | POA: Insufficient documentation

## 2011-12-08 ENCOUNTER — Ambulatory Visit: Payer: Medicare Other | Admitting: Physical Therapy

## 2011-12-15 ENCOUNTER — Ambulatory Visit: Payer: Medicare Other | Attending: Neurology | Admitting: Physical Therapy

## 2011-12-15 DIAGNOSIS — M545 Low back pain, unspecified: Secondary | ICD-10-CM | POA: Insufficient documentation

## 2011-12-15 DIAGNOSIS — R5381 Other malaise: Secondary | ICD-10-CM | POA: Insufficient documentation

## 2011-12-15 DIAGNOSIS — IMO0001 Reserved for inherently not codable concepts without codable children: Secondary | ICD-10-CM | POA: Insufficient documentation

## 2011-12-22 ENCOUNTER — Ambulatory Visit: Payer: Medicare Other | Admitting: Physical Therapy

## 2011-12-29 ENCOUNTER — Ambulatory Visit: Payer: Medicare Other | Admitting: Physical Therapy

## 2011-12-30 ENCOUNTER — Encounter: Payer: Medicare Other | Admitting: Physical Therapy

## 2012-01-05 ENCOUNTER — Ambulatory Visit: Payer: Medicare Other | Admitting: Physical Therapy

## 2012-01-14 ENCOUNTER — Ambulatory Visit (INDEPENDENT_AMBULATORY_CARE_PROVIDER_SITE_OTHER): Payer: Medicare Other | Admitting: Cardiovascular Disease

## 2012-01-14 ENCOUNTER — Encounter: Payer: Self-pay | Admitting: Cardiovascular Disease

## 2012-01-14 VITALS — BP 152/74 | HR 70 | Wt 161.0 lb

## 2012-01-14 DIAGNOSIS — I10 Essential (primary) hypertension: Secondary | ICD-10-CM

## 2012-01-14 DIAGNOSIS — I359 Nonrheumatic aortic valve disorder, unspecified: Secondary | ICD-10-CM

## 2012-01-14 DIAGNOSIS — I350 Nonrheumatic aortic (valve) stenosis: Secondary | ICD-10-CM

## 2012-01-14 DIAGNOSIS — R0989 Other specified symptoms and signs involving the circulatory and respiratory systems: Secondary | ICD-10-CM

## 2012-01-14 DIAGNOSIS — E785 Hyperlipidemia, unspecified: Secondary | ICD-10-CM

## 2012-01-14 NOTE — Assessment & Plan Note (Signed)
Cholesterol is at goal.  Continue current dose of statin and diet Rx.  No myalgias or side effects.  F/U  LFT's in 6 months. Lab Results  Component Value Date   [facility]  Value: 79        Total Cholesterol/HDL:CHD Risk Coronary Heart Disease Risk Table                     Men   Women  1/2 Average Risk   3.4   3.3 10/09/2006

## 2012-01-14 NOTE — Progress Notes (Signed)
Patient ID: [name], female   DOB: Sep 01, 1935, 76 y.o.   MRN: [id number] [name] is seen today in followup for chest pain aortic stenosis and hypercholesterolemia. His been doing well. She has arthritis and fibromyalgia.She had an essentially normal heart cath in 2008 and a normal Myoview 2009. Her EKGs have been normal. Don't think her current chest pains or angina. She has mild aortic stenosis. Her mean aortic valve gradient was 10 with a peak gradient of 14 by echo on 11/10 Her dentition is in good shape. He is not any palpitations PND orthopnea his been no syncope lower extremity edema or fever of unknown origin.   ROS: Denies fever, malais, weight loss, blurry vision, decreased visual acuity, cough, sputum, SOB, hemoptysis, pleuritic pain, palpitaitons, heartburn, abdominal pain, melena, lower extremity edema, claudication, or rash.  All other systems reviewed and negative  General: Affect appropriate Healthy:  appears stated age HEENT: normal Neck supple with no adenopathy JVP normal bilateral  bruits no thyromegaly Lungs clear with no wheezing and good diaphragmatic motion Heart:  S1/S2 SEM  murmur, no rub, gallop or click PMI normal Abdomen: benighn, BS positve, no tenderness, no AAA no bruit.  No HSM or HJR Distal pulses intact with no bruits No edema Neuro non-focal Skin warm and dry No muscular weakness   Current Outpatient Prescriptions  Medication Sig Dispense Refill  . ALOE VERA PO Take by mouth.        . Ascorbic Acid (VITAMIN C) 1000 MG tablet Take 1,000 mg by mouth daily.        Marland Kitchen aspirin 81 MG tablet Take 81 mg by mouth daily.        . Calcium-Vitamin D 600-125 MG-UNIT TABS Take by mouth.        . Cholecalciferol (VITAMIN D3) 1000 UNITS CAPS Take by mouth.        Marland Kitchen CINNAMON PO Take by mouth.      . Clobetasol Prop Emollient Base (CLOBETASOL PROPIONATE E) 0.05 % emollient cream Apply topically 2 (two) times daily.        . Cyanocobalamin (VITAMIN B-12) 2500 MCG SUBL  Place under the tongue.        . fish oil-omega-3 fatty acids 1000 MG capsule Take 2 g by mouth daily.      Marland Kitchen gabapentin (NEURONTIN) 300 MG capsule Take 1 capsule (300 mg total) by mouth 3 (three) times daily. increase to 2 capsule three times per day as directed.  90 capsule  5  . hydrocortisone 2.5 % cream APPLY TO AFFECTED AREA TWICE DAILY  30 g  0  . metoprolol (LOPRESSOR) 100 MG tablet Take 100 mg by mouth 2 (two) times daily.        Marland Kitchen omeprazole (PRILOSEC) 20 MG capsule Take 1 capsule (20 mg total) by mouth daily.  30 capsule  5  . pravastatin (PRAVACHOL) 40 MG tablet Take 40 mg by mouth daily.      . vitamin E 400 UNIT capsule Take 400 Units by mouth daily.        Marland Kitchen VITAMIN K PO Take by mouth.          Allergies  Latex  Electrocardiogram:  02/24/10  SR rate 64 normal ECG  NSR rate 70 normal today  Assessment and Plan

## 2012-01-14 NOTE — Assessment & Plan Note (Signed)
Murmur seems a little worse RLSB  F/U echo not been done in 3 years

## 2012-01-14 NOTE — Patient Instructions (Addendum)
Your physician wants you to follow-up in: YEAR WITH DR Haywood Filler will receive a reminder letter in the mail two months in advance. If you don't receive a letter, please call our office to schedule the follow-up appointment. Your physician recommends that you continue on your current medications as directed. Please refer to the Current Medication list given to you today. Your physician has requested that you have an echocardiogram. Echocardiography is a painless test that uses sound waves to create images of your heart. It provides your doctor with information about the size and shape of your heart and how well your heart's chambers and valves are working. This procedure takes approximately one hour. There are no restrictions for this procedure. DX as Your physician has requested that you have a carotid duplex. This test is an ultrasound of the carotid arteries in your neck. It looks at blood flow through these arteries that supply the brain with blood. Allow one hour for this exam. There are no restrictions or special instructions. DX BRUIT

## 2012-01-14 NOTE — Assessment & Plan Note (Signed)
Well controlled.  Continue current medications and low sodium Dash type diet.    

## 2012-01-14 NOTE — Assessment & Plan Note (Signed)
Hard to differentiate transmitted AS murmur from carotid bruit  F/U carotid duplex

## 2012-01-20 ENCOUNTER — Ambulatory Visit (HOSPITAL_COMMUNITY): Payer: Medicare Other | Attending: Cardiovascular Disease

## 2012-01-20 DIAGNOSIS — I080 Rheumatic disorders of both mitral and aortic valves: Secondary | ICD-10-CM | POA: Insufficient documentation

## 2012-01-20 DIAGNOSIS — I10 Essential (primary) hypertension: Secondary | ICD-10-CM | POA: Insufficient documentation

## 2012-01-20 DIAGNOSIS — I359 Nonrheumatic aortic valve disorder, unspecified: Secondary | ICD-10-CM

## 2012-01-20 DIAGNOSIS — I350 Nonrheumatic aortic (valve) stenosis: Secondary | ICD-10-CM

## 2012-01-20 DIAGNOSIS — I369 Nonrheumatic tricuspid valve disorder, unspecified: Secondary | ICD-10-CM | POA: Insufficient documentation

## 2012-01-20 NOTE — Progress Notes (Signed)
Echocardiogram performed.  

## 2012-01-25 ENCOUNTER — Encounter (INDEPENDENT_AMBULATORY_CARE_PROVIDER_SITE_OTHER): Payer: Medicare Other

## 2012-01-25 DIAGNOSIS — R0989 Other specified symptoms and signs involving the circulatory and respiratory systems: Secondary | ICD-10-CM

## 2012-03-03 ENCOUNTER — Other Ambulatory Visit: Payer: Self-pay | Admitting: Neurology

## 2012-03-03 MED ORDER — GABAPENTIN 300 MG PO CAPS: 300.0000 mg | ORAL_CAPSULE | Freq: Three times a day (TID) | ORAL | Status: DC

## 2012-04-12 ENCOUNTER — Emergency Department (HOSPITAL_COMMUNITY)
Admission: EM | Admit: 2012-04-12 | Discharge: 2012-04-12 | Disposition: A | Discharge status: Home / Self Care | Payer: Medicare Other | Attending: Emergency Medicine | Admitting: Emergency Medicine

## 2012-04-12 ENCOUNTER — Emergency Department (HOSPITAL_COMMUNITY): Payer: Medicare Other

## 2012-04-12 ENCOUNTER — Encounter (HOSPITAL_COMMUNITY): Payer: Self-pay | Admitting: *Deleted

## 2012-04-12 DIAGNOSIS — I10 Essential (primary) hypertension: Secondary | ICD-10-CM | POA: Insufficient documentation

## 2012-04-12 DIAGNOSIS — S46909A Unspecified injury of unspecified muscle, fascia and tendon at shoulder and upper arm level, unspecified arm, initial encounter: Secondary | ICD-10-CM | POA: Insufficient documentation

## 2012-04-12 DIAGNOSIS — Z87442 Personal history of urinary calculi: Secondary | ICD-10-CM | POA: Insufficient documentation

## 2012-04-12 DIAGNOSIS — W010XXA Fall on same level from slipping, tripping and stumbling without subsequent striking against object, initial encounter: Secondary | ICD-10-CM | POA: Insufficient documentation

## 2012-04-12 DIAGNOSIS — Z8679 Personal history of other diseases of the circulatory system: Secondary | ICD-10-CM | POA: Insufficient documentation

## 2012-04-12 DIAGNOSIS — Z8739 Personal history of other diseases of the musculoskeletal system and connective tissue: Secondary | ICD-10-CM | POA: Insufficient documentation

## 2012-04-12 DIAGNOSIS — I2510 Atherosclerotic heart disease of native coronary artery without angina pectoris: Secondary | ICD-10-CM | POA: Insufficient documentation

## 2012-04-12 DIAGNOSIS — Z8669 Personal history of other diseases of the nervous system and sense organs: Secondary | ICD-10-CM | POA: Insufficient documentation

## 2012-04-12 DIAGNOSIS — K219 Gastro-esophageal reflux disease without esophagitis: Secondary | ICD-10-CM | POA: Insufficient documentation

## 2012-04-12 DIAGNOSIS — E785 Hyperlipidemia, unspecified: Secondary | ICD-10-CM | POA: Insufficient documentation

## 2012-04-12 DIAGNOSIS — Z8709 Personal history of other diseases of the respiratory system: Secondary | ICD-10-CM | POA: Insufficient documentation

## 2012-04-12 DIAGNOSIS — Z7982 Long term (current) use of aspirin: Secondary | ICD-10-CM | POA: Insufficient documentation

## 2012-04-12 DIAGNOSIS — S82409A Unspecified fracture of shaft of unspecified fibula, initial encounter for closed fracture: Secondary | ICD-10-CM | POA: Insufficient documentation

## 2012-04-12 DIAGNOSIS — R011 Cardiac murmur, unspecified: Secondary | ICD-10-CM | POA: Insufficient documentation

## 2012-04-12 DIAGNOSIS — Z8719 Personal history of other diseases of the digestive system: Secondary | ICD-10-CM | POA: Insufficient documentation

## 2012-04-12 DIAGNOSIS — Y939 Activity, unspecified: Secondary | ICD-10-CM | POA: Insufficient documentation

## 2012-04-12 DIAGNOSIS — S4980XA Other specified injuries of shoulder and upper arm, unspecified arm, initial encounter: Secondary | ICD-10-CM | POA: Insufficient documentation

## 2012-04-12 DIAGNOSIS — Y92009 Unspecified place in unspecified non-institutional (private) residence as the place of occurrence of the external cause: Secondary | ICD-10-CM | POA: Insufficient documentation

## 2012-04-12 DIAGNOSIS — Z79899 Other long term (current) drug therapy: Secondary | ICD-10-CM | POA: Insufficient documentation

## 2012-04-12 MED ORDER — OXYCODONE-ACETAMINOPHEN 5-325 MG PO TABS: 1.0000 | ORAL_TABLET | Freq: Four times a day (QID) | ORAL | Status: DC | PRN

## 2012-04-12 MED ORDER — OXYCODONE-ACETAMINOPHEN 5-325 MG PO TABS
1.0000 | ORAL_TABLET | Freq: Once | ORAL | Status: AC
  Administered 2012-04-12: 1 via ORAL
  Filled 2012-04-12: qty 1

## 2012-04-12 NOTE — ED Provider Notes (Addendum)
History   This chart was scribed for Suzi Roots, MD by Charolett Bumpers, ED Scribe. The patient was seen in room TR07C/TR07C. Patient's care was started at 1455.   CSN: [id number]  Arrival date & time 04/12/12  1356   First MD Initiated Contact with Patient 04/12/12 1455      Chief Complaint  Patient presents with  . Ankle Pain    The history is provided by the patient. No language interpreter was used.  [name] is a 76 y.o. female who presents to the Emergency Department complaining of constant, severe right ankle pain. She states that she fell on 12/23 at home. She was seen 4 days ago by PCP and told she had fractured her ankle and placed in a boot. She states that after getting home, the boot was causing her severe pain and has not been able to wear the boot. She reports her pain has worsened since and now has pain shooting up towards the right shin. She also complains of right shoulder pain that started after falling as well. She states her pain is worse with movement and walking. No loc w fall. Was trip and fall/mechanical fall. No faintness or dizziness. No headache. No neck or back pain.   Past Medical History  Diagnosis Date  . Hypertension   . Heart murmur     Dr Eden Emms is patient's Cardiologist.   . GERD (gastroesophageal reflux disease)   . Hyperlipidemia   . Sleep apnea   . Diverticular disease   . Fibromyalgia   . CAD (coronary artery disease)   . Bronchitis   . Hemorrhoids   . Chronic headaches   . Bruises easily   . Breast pain   . History of kidney stones   . History of blurred vision   . Joint pain   . Tremor     Past Surgical History  Procedure Date  . Neck surgery 10 years ago  . Laparoscopic cholecystectomy 20 years ago  . Hernia repair 6 years ago  . Bladder repair 8 years ago    No family history on file.  History  Substance Use Topics  . Smoking status: Never Smoker   . Smokeless tobacco: Never Used  . Alcohol Use: Yes   Comment: once in a while    OB History    Grav Para Term Preterm Abortions TAB SAB Ect Mult Living                  Review of Systems  Constitutional: Negative for fever.  HENT: Negative for neck pain.   Cardiovascular: Negative for leg swelling.  Musculoskeletal: Positive for arthralgias. Negative for back pain.  Neurological: Negative for numbness and headaches.  All other systems reviewed and are negative.    Allergies  Latex  Home Medications   Current Outpatient Rx  Name  Route  Sig  Dispense  Refill  . ALOE VERA PO   Oral   Take by mouth.           Marland Kitchen VITAMIN C 1000 MG PO TABS   Oral   Take 1,000 mg by mouth daily.           . ASPIRIN 81 MG PO TABS   Oral   Take 81 mg by mouth daily.           Marland Kitchen CALCIUM-VITAMIN D 600-125 MG-UNIT PO TABS   Oral   Take by mouth.           Marland Kitchen  VITAMIN D3 1000 UNITS PO CAPS   Oral   Take by mouth.           Marland Kitchen CINNAMON PO   Oral   Take by mouth.         . CLOBETASOL PROP EMOLLIENT BASE 0.05 % EX CREA   Topical   Apply topically 2 (two) times daily.           Marland Kitchen VITAMIN B-12 2500 MCG SL SUBL   Sublingual   Place under the tongue.           Marland Kitchen OMEGA-3 FATTY ACIDS 1000 MG PO CAPS   Oral   Take 2 g by mouth daily.         Marland Kitchen GABAPENTIN 300 MG PO CAPS   Oral   Take 1 capsule (300 mg total) by mouth 3 (three) times daily. increase to 2 capsule three times per day as directed.   180 capsule   0     The patient needs to make an appointment with our  ...   . HYDROCODONE-ACETAMINOPHEN 5-500 MG PO TABS   Oral   Take 1 tablet by mouth every 6 (six) hours as needed.         Marland Kitchen HYDROCORTISONE 2.5 % EX CREA      APPLY TO AFFECTED AREA TWICE DAILY   30 g   0   . METOPROLOL TARTRATE 100 MG PO TABS   Oral   Take 100 mg by mouth 2 (two) times daily.           Marland Kitchen OMEPRAZOLE 20 MG PO CPDR   Oral   Take 1 capsule (20 mg total) by mouth daily.   30 capsule   5   . PRAVASTATIN SODIUM 40 MG PO TABS    Oral   Take 40 mg by mouth daily.         Marland Kitchen VITAMIN E 400 UNITS PO CAPS   Oral   Take 400 Units by mouth daily.           Marland Kitchen VITAMIN K PO   Oral   Take by mouth.             BP 145/72  Pulse 89  Temp 98.5 F (36.9 C) (Oral)  Resp 18  SpO2 98%  Physical Exam  Nursing note and vitals reviewed. Constitutional: She is oriented to person, place, and time. She appears well-developed and well-nourished. No distress.  HENT:  Head: Normocephalic and atraumatic.  Eyes: Conjunctivae normal are normal. Pupils are equal, round, and reactive to light. No scleral icterus.  Neck: Normal range of motion. Neck supple. No tracheal deviation present.  Cardiovascular: Normal rate and intact distal pulses.   Pulmonary/Chest: Effort normal. No respiratory distress. She exhibits no tenderness.  Abdominal: Soft. There is no tenderness.  Musculoskeletal: Normal range of motion. She exhibits tenderness. She exhibits no edema.       Tenderness of the right ankle and tibia. Right shoulder tenderness. Distal pulses palp. Good rom. No other focal bony tenderness noted. CTLS spine, non tender, aligned, no step off.   Neurological: She is alert and oriented to person, place, and time.  Skin: Skin is warm and dry. No rash noted.  Psychiatric: She has a normal mood and affect. Her behavior is normal.    ED Course  Procedures (including critical care time)  DIAGNOSTIC STUDIES: Oxygen Saturation is 98% on room air, normal by my interpretation.    COORDINATION OF CARE:  15:05-Discussed planned course of treatment with the patient including pain medication and x-rays of right shoulder, ankle and tibia/fibula, who is agreeable at this time.   15:15-Medication Orders: Oxycodone-acetaminophen (Percocet/Roxicet) 5-325 mg per tablet 1 tablet-once.   Dg Shoulder Right  04/12/2012  *RADIOLOGY REPORT*  Clinical Data: Larey Seat.  Right shoulder pain.  RIGHT SHOULDER - 2+ VIEW  Comparison: None  Findings: The  humeral head is normally located.  No acute fracture. Mild AC joint and glenohumeral joint degenerative changes.  IMPRESSION: No acute bony findings.   Original Report Authenticated By: Rudie Meyer, M.D.    Dg Tibia/fibula Right  04/12/2012  *RADIOLOGY REPORT*  Clinical Data: Larey Seat.  Right leg pain.  RIGHT TIBIA AND FIBULA - 2 VIEW  Comparison: Ankle films, same date.  Findings: The distal tibia and fibula are not included on the examination.  Distal fractures cannot be excluded.  There are advanced degenerative changes involving the knee joint.  No obvious fracture.  A very small joint effusion is noted.  IMPRESSION: No tibia or fibula shaft fractures. Advanced knee joint degenerative changes and small joint effusion.   Original Report Authenticated By: Rudie Meyer, M.D.    Dg Ankle Complete Right  04/12/2012  *RADIOLOGY REPORT*  Clinical Data: Larey Seat.  Persistent lateral ankle pain.  RIGHT ANKLE - COMPLETE 3+ VIEW  Comparison: None  Findings: There is a nondisplaced fracture of the distal fibula just above the level of the ankle mortise.  The ankle mortise is maintained.  No tibial fracture.  The talus and subtalar joints are normal.  A large calcaneal heel spur is noted.  IMPRESSION: Nondisplaced distal fibular shaft fracture.   Original Report Authenticated By: Rudie Meyer, M.D.       MDM  I personally performed the services described in this documentation, which was scribed in my presence. The recorded information has been reviewed and is accurate.  Xrays.   Pt says home meds havent helped pain. Percocet po (pt has ride, doesn't have to drive).   rec walker, camwalker, elevate, pain rx.   Pt already has appt w Dr Simonne Come this week and camwalker at home.   Suzi Roots, MD 04/12/12 1622  Suzi Roots, MD 04/12/12 [phone]

## 2012-04-12 NOTE — ED Notes (Signed)
Pt fell on 23rd and then saw MD for shoulder and ankle pain/  PT was told she had a fracture to ankle and then put her in a boot on Friday.  Pain in ankle got worse from boot and patient has pain to right lateral lower extremity.  Pulse present.  Pt states pain increases with movement

## 2012-04-12 NOTE — ED Notes (Signed)
Patient is alert and orientedx4.  Patient was explained discharge instructions and to her spouse as well and they understood with no question.  Mr. [name], the patient's husband is transporting the patient home.

## 2012-05-17 ENCOUNTER — Other Ambulatory Visit: Payer: Self-pay | Admitting: Orthopedic Surgery

## 2012-05-23 ENCOUNTER — Encounter (HOSPITAL_BASED_OUTPATIENT_CLINIC_OR_DEPARTMENT_OTHER): Payer: Self-pay | Admitting: *Deleted

## 2012-05-23 NOTE — Progress Notes (Signed)
NPO AFTER MN. ARRIVES AT 0815. NEEDS ISTAT. CURRENT EKG IN EPIC AND CHART. WILL TAKE GABAPENTIN, METOPROLOL AND ZANAFLEX AM OF SURG W/ SIPS OF WATER. PT AWARE OWER AT MAIN.

## 2012-05-25 ENCOUNTER — Ambulatory Visit (HOSPITAL_BASED_OUTPATIENT_CLINIC_OR_DEPARTMENT_OTHER): Payer: Medicare Other | Admitting: Anesthesiology

## 2012-05-25 ENCOUNTER — Encounter (HOSPITAL_BASED_OUTPATIENT_CLINIC_OR_DEPARTMENT_OTHER): Payer: Self-pay

## 2012-05-25 ENCOUNTER — Encounter (HOSPITAL_COMMUNITY)
Admission: RE | Disposition: A | Discharge status: Home / Self Care | Payer: Self-pay | Source: Ambulatory Visit | Attending: Orthopedic Surgery

## 2012-05-25 ENCOUNTER — Ambulatory Visit (HOSPITAL_BASED_OUTPATIENT_CLINIC_OR_DEPARTMENT_OTHER)
Admission: RE | Admit: 2012-05-25 | Discharge: 2012-05-27 | Disposition: A | Discharge status: Home / Self Care | Payer: Medicare Other | Source: Ambulatory Visit | Attending: Orthopedic Surgery | Admitting: Orthopedic Surgery

## 2012-05-25 ENCOUNTER — Encounter (HOSPITAL_BASED_OUTPATIENT_CLINIC_OR_DEPARTMENT_OTHER): Payer: Self-pay | Admitting: Anesthesiology

## 2012-05-25 DIAGNOSIS — M19019 Primary osteoarthritis, unspecified shoulder: Secondary | ICD-10-CM | POA: Insufficient documentation

## 2012-05-25 DIAGNOSIS — M719 Bursopathy, unspecified: Secondary | ICD-10-CM | POA: Insufficient documentation

## 2012-05-25 DIAGNOSIS — R51 Headache: Secondary | ICD-10-CM | POA: Insufficient documentation

## 2012-05-25 DIAGNOSIS — M67919 Unspecified disorder of synovium and tendon, unspecified shoulder: Secondary | ICD-10-CM | POA: Insufficient documentation

## 2012-05-25 DIAGNOSIS — M24119 Other articular cartilage disorders, unspecified shoulder: Secondary | ICD-10-CM | POA: Insufficient documentation

## 2012-05-25 DIAGNOSIS — S43429A Sprain of unspecified rotator cuff capsule, initial encounter: Secondary | ICD-10-CM

## 2012-05-25 DIAGNOSIS — M66329 Spontaneous rupture of flexor tendons, unspecified upper arm: Secondary | ICD-10-CM | POA: Insufficient documentation

## 2012-05-25 HISTORY — DX: Spinal stenosis, site unspecified: M48.00

## 2012-05-25 HISTORY — PX: SHOULDER ARTHROSCOPY WITH OPEN ROTATOR CUFF REPAIR AND DISTAL CLAVICLE ACROMINECTOMY: SHX5683

## 2012-05-25 HISTORY — DX: Obstructive sleep apnea (adult) (pediatric): G47.33

## 2012-05-25 LAB — POCT I-STAT 4, (NA,K, GLUC, HGB,HCT)
Hemoglobin: 13.6 g/dL (ref 12.0–15.0)
Potassium: 4.9 mEq/L (ref 3.5–5.1)

## 2012-05-25 SURGERY — SHOULDER ARTHROSCOPY WITH OPEN ROTATOR CUFF REPAIR AND DISTAL CLAVICLE ACROMINECTOMY
Anesthesia: Regional | Site: Shoulder | Laterality: Right | Wound class: Clean

## 2012-05-25 MED ORDER — ONDANSETRON HCL 4 MG/2ML IJ SOLN
INTRAMUSCULAR | Status: DC | PRN
  Administered 2012-05-25: 4 mg via INTRAVENOUS

## 2012-05-25 MED ORDER — SODIUM CHLORIDE 0.9 % IV SOLN
INTRAVENOUS | Status: DC
  Administered 2012-05-25: 15:00:00 via INTRAVENOUS

## 2012-05-25 MED ORDER — METOCLOPRAMIDE HCL 10 MG PO TABS: 5.0000 mg | ORAL_TABLET | Freq: Three times a day (TID) | ORAL | Status: DC | PRN

## 2012-05-25 MED ORDER — OXYCODONE-ACETAMINOPHEN 5-325 MG PO TABS
1.0000 | ORAL_TABLET | ORAL | Status: DC | PRN
  Administered 2012-05-26 – 2012-05-27 (×2): 2 via ORAL
  Filled 2012-05-25 (×2): qty 2

## 2012-05-25 MED ORDER — METHOCARBAMOL 500 MG PO TABS
500.0000 mg | ORAL_TABLET | Freq: Four times a day (QID) | ORAL | Status: DC | PRN
  Administered 2012-05-26: 500 mg via ORAL
  Filled 2012-05-25: qty 1

## 2012-05-25 MED ORDER — FENTANYL CITRATE 0.05 MG/ML IJ SOLN
50.0000 ug | INTRAMUSCULAR | Status: DC | PRN
  Administered 2012-05-25: 100 ug via INTRAVENOUS
  Filled 2012-05-25: qty 2

## 2012-05-25 MED ORDER — GUAIFENESIN 100 MG/5ML PO SOLN
5.0000 mL | Freq: Four times a day (QID) | ORAL | Status: DC | PRN
  Administered 2012-05-25 (×2): 100 mg via ORAL
  Filled 2012-05-25 (×2): qty 10

## 2012-05-25 MED ORDER — ACETAMINOPHEN 10 MG/ML IV SOLN
1000.0000 mg | Freq: Once | INTRAVENOUS | Status: DC | PRN
  Filled 2012-05-25: qty 100

## 2012-05-25 MED ORDER — METHOCARBAMOL 100 MG/ML IJ SOLN
500.0000 mg | Freq: Four times a day (QID) | INTRAVENOUS | Status: DC | PRN
  Filled 2012-05-25: qty 5

## 2012-05-25 MED ORDER — SODIUM CHLORIDE 0.9 % IR SOLN
Status: DC | PRN
  Administered 2012-05-25: 11:00:00

## 2012-05-25 MED ORDER — ROPIVACAINE HCL 5 MG/ML IJ SOLN
INTRAMUSCULAR | Status: DC | PRN
  Administered 2012-05-25: 30 mL

## 2012-05-25 MED ORDER — KETOROLAC TROMETHAMINE 10 MG PO TABS
10.0000 mg | ORAL_TABLET | Freq: Four times a day (QID) | ORAL | Status: DC | PRN
  Filled 2012-05-25: qty 1

## 2012-05-25 MED ORDER — POVIDONE-IODINE 7.5 % EX SOLN
Freq: Once | CUTANEOUS | Status: DC
  Filled 2012-05-25: qty 118

## 2012-05-25 MED ORDER — OXYCODONE HCL 5 MG PO TABS
5.0000 mg | ORAL_TABLET | Freq: Once | ORAL | Status: DC | PRN
  Filled 2012-05-25: qty 1

## 2012-05-25 MED ORDER — ONDANSETRON HCL 4 MG/2ML IJ SOLN: 4.0000 mg | Freq: Four times a day (QID) | INTRAMUSCULAR | Status: DC | PRN

## 2012-05-25 MED ORDER — ASPIRIN 81 MG PO CHEW
81.0000 mg | CHEWABLE_TABLET | Freq: Every day | ORAL | Status: DC
  Administered 2012-05-26 – 2012-05-27 (×2): 81 mg via ORAL
  Filled 2012-05-25 (×2): qty 1

## 2012-05-25 MED ORDER — TIZANIDINE HCL 2 MG PO TABS
4.0000 mg | ORAL_TABLET | Freq: Three times a day (TID) | ORAL | Status: DC
  Administered 2012-05-25 – 2012-05-27 (×6): 4 mg via ORAL
  Filled 2012-05-25 (×8): qty 2

## 2012-05-25 MED ORDER — GABAPENTIN 300 MG PO CAPS
900.0000 mg | ORAL_CAPSULE | Freq: Three times a day (TID) | ORAL | Status: DC
  Administered 2012-05-25 – 2012-05-27 (×6): 900 mg via ORAL
  Filled 2012-05-25 (×8): qty 3

## 2012-05-25 MED ORDER — SODIUM CHLORIDE 0.9 % IR SOLN
Status: DC | PRN
  Administered 2012-05-25: 6000 mL

## 2012-05-25 MED ORDER — ONDANSETRON HCL 4 MG PO TABS: 4.0000 mg | ORAL_TABLET | Freq: Four times a day (QID) | ORAL | Status: DC | PRN

## 2012-05-25 MED ORDER — CEFAZOLIN SODIUM-DEXTROSE 2-3 GM-% IV SOLR
2.0000 g | INTRAVENOUS | Status: AC
  Administered 2012-05-25: 2 g via INTRAVENOUS
  Filled 2012-05-25: qty 50

## 2012-05-25 MED ORDER — SUCCINYLCHOLINE CHLORIDE 20 MG/ML IJ SOLN
INTRAMUSCULAR | Status: DC | PRN
  Administered 2012-05-25: 100 mg via INTRAVENOUS

## 2012-05-25 MED ORDER — OXYCODONE HCL 5 MG/5ML PO SOLN
5.0000 mg | Freq: Once | ORAL | Status: DC | PRN
  Filled 2012-05-25: qty 5

## 2012-05-25 MED ORDER — METOPROLOL SUCCINATE ER 100 MG PO TB24
100.0000 mg | ORAL_TABLET | Freq: Every morning | ORAL | Status: DC
  Administered 2012-05-26 – 2012-05-27 (×2): 100 mg via ORAL
  Filled 2012-05-25 (×2): qty 1

## 2012-05-25 MED ORDER — MENTHOL 3 MG MT LOZG
1.0000 | LOZENGE | OROMUCOSAL | Status: DC | PRN
  Filled 2012-05-25: qty 9

## 2012-05-25 MED ORDER — PROMETHAZINE HCL 25 MG/ML IJ SOLN
6.2500 mg | INTRAMUSCULAR | Status: DC | PRN
  Filled 2012-05-25: qty 1

## 2012-05-25 MED ORDER — HYDROMORPHONE HCL PF 1 MG/ML IJ SOLN
0.5000 mg | INTRAMUSCULAR | Status: DC | PRN
  Administered 2012-05-26 (×2): 1 mg via INTRAVENOUS
  Filled 2012-05-25 (×2): qty 1

## 2012-05-25 MED ORDER — BUPIVACAINE-EPINEPHRINE 0.5% -1:200000 IJ SOLN
INTRAMUSCULAR | Status: DC | PRN
  Administered 2012-05-25: 5 mL

## 2012-05-25 MED ORDER — FENTANYL CITRATE 0.05 MG/ML IJ SOLN
INTRAMUSCULAR | Status: DC | PRN
  Administered 2012-05-25: 25 ug via INTRAVENOUS
  Administered 2012-05-25: 50 ug via INTRAVENOUS

## 2012-05-25 MED ORDER — MEPERIDINE HCL 25 MG/ML IJ SOLN
6.2500 mg | INTRAMUSCULAR | Status: DC | PRN
  Filled 2012-05-25: qty 1

## 2012-05-25 MED ORDER — DEXAMETHASONE SODIUM PHOSPHATE 4 MG/ML IJ SOLN
INTRAMUSCULAR | Status: DC | PRN
  Administered 2012-05-25: 8 mg via INTRAVENOUS

## 2012-05-25 MED ORDER — MIDAZOLAM HCL 5 MG/5ML IJ SOLN
INTRAMUSCULAR | Status: DC | PRN
  Administered 2012-05-25: 1 mg via INTRAVENOUS

## 2012-05-25 MED ORDER — HYDROMORPHONE HCL PF 1 MG/ML IJ SOLN
0.2500 mg | INTRAMUSCULAR | Status: DC | PRN
  Filled 2012-05-25: qty 1

## 2012-05-25 MED ORDER — LACTATED RINGERS IV SOLN
INTRAVENOUS | Status: DC
  Administered 2012-05-25 (×2): via INTRAVENOUS
  Filled 2012-05-25: qty 1000

## 2012-05-25 MED ORDER — LIDOCAINE HCL (CARDIAC) 20 MG/ML IV SOLN
INTRAVENOUS | Status: DC | PRN
  Administered 2012-05-25: 50 mg via INTRAVENOUS

## 2012-05-25 MED ORDER — SIMVASTATIN 20 MG PO TABS
20.0000 mg | ORAL_TABLET | Freq: Every day | ORAL | Status: DC
  Administered 2012-05-25 – 2012-05-26 (×2): 20 mg via ORAL
  Filled 2012-05-25 (×3): qty 1

## 2012-05-25 MED ORDER — HYDROCODONE-ACETAMINOPHEN 5-325 MG PO TABS
1.0000 | ORAL_TABLET | ORAL | Status: DC | PRN
  Administered 2012-05-25 – 2012-05-26 (×3): 1 via ORAL
  Filled 2012-05-25 (×3): qty 1

## 2012-05-25 MED ORDER — PANTOPRAZOLE SODIUM 40 MG PO TBEC
40.0000 mg | DELAYED_RELEASE_TABLET | Freq: Every day | ORAL | Status: DC
  Administered 2012-05-26 – 2012-05-27 (×2): 40 mg via ORAL
  Filled 2012-05-25 (×2): qty 1

## 2012-05-25 MED ORDER — MIDAZOLAM HCL 2 MG/2ML IJ SOLN
1.0000 mg | INTRAMUSCULAR | Status: DC | PRN
  Administered 2012-05-25: 1 mg via INTRAVENOUS
  Filled 2012-05-25: qty 2

## 2012-05-25 MED ORDER — PROPOFOL 10 MG/ML IV BOLUS
INTRAVENOUS | Status: DC | PRN
  Administered 2012-05-25: 150 mg via INTRAVENOUS

## 2012-05-25 MED ORDER — METOCLOPRAMIDE HCL 5 MG/ML IJ SOLN: 5.0000 mg | Freq: Three times a day (TID) | INTRAMUSCULAR | Status: DC | PRN

## 2012-05-25 SURGICAL SUPPLY — 76 items
ANCHOR PEEK ZIP 5.5 NDL NO2 (Orthopedic Implant) ×4 IMPLANT
BENZOIN TINCTURE PRP APPL 2/3 (GAUZE/BANDAGES/DRESSINGS) IMPLANT
BLADE 4.2CUDA (BLADE) ×2 IMPLANT
BLADE CUDA 4.2 (BLADE) IMPLANT
BLADE CUDA 5.5 (BLADE) IMPLANT
BLADE CUDA SHAVER 3.5 (BLADE) IMPLANT
BLADE CUTTER GATOR 3.5 (BLADE) IMPLANT
BLADE FLAT COURSE (BLADE) IMPLANT
BLADE GREAT WHITE 4.2 (BLADE) IMPLANT
BLADE SURG 10 STRL SS (BLADE) ×2 IMPLANT
BLADE SURG 15 STRL LF DISP TIS (BLADE) IMPLANT
BLADE SURG 15 STRL SS (BLADE)
BUR OVAL 4.0 (BURR) ×2 IMPLANT
CANISTER SUCT LVC 12 LTR MEDI- (MISCELLANEOUS) ×2 IMPLANT
CANISTER SUCTION 1200CC (MISCELLANEOUS) ×2 IMPLANT
CLEANER CAUTERY TIP 5X5 PAD (MISCELLANEOUS) IMPLANT
CLOTH BEACON ORANGE TIMEOUT ST (SAFETY) ×2 IMPLANT
DRAPE INCISE IOBAN 66X45 STRL (DRAPES) ×2 IMPLANT
DRAPE LG THREE QUARTER DISP (DRAPES) ×4 IMPLANT
DRAPE SHOULDER BEACH CHAIR (DRAPES) ×2 IMPLANT
DRAPE U-SHAPE 47X51 STRL (DRAPES) ×2 IMPLANT
DRSG ADAPTIC 3X8 NADH LF (GAUZE/BANDAGES/DRESSINGS) ×2 IMPLANT
DRSG EMULSION OIL 3X3 NADH (GAUZE/BANDAGES/DRESSINGS) ×2 IMPLANT
DRSG PAD ABDOMINAL 8X10 ST (GAUZE/BANDAGES/DRESSINGS) ×2 IMPLANT
DURAPREP 26ML APPLICATOR (WOUND CARE) ×2 IMPLANT
ELECT MENISCUS 165MM 90D (ELECTRODE) IMPLANT
ELECT REM PT RETURN 9FT ADLT (ELECTROSURGICAL) ×2
ELECTRODE REM PT RTRN 9FT ADLT (ELECTROSURGICAL) ×1 IMPLANT
GLOVE BIOGEL PI IND STRL 8 (GLOVE) ×2 IMPLANT
GLOVE BIOGEL PI INDICATOR 8 (GLOVE) ×2
GLOVE ECLIPSE 8.0 STRL XLNG CF (GLOVE) IMPLANT
GLOVE INDICATOR 6.0 STRL GRN (GLOVE) ×4 IMPLANT
GLOVE INDICATOR 8.0 STRL GRN (GLOVE) ×4 IMPLANT
GOWN PREVENTION PLUS LG XLONG (DISPOSABLE) ×2 IMPLANT
GOWN STRL REIN XL XLG (GOWN DISPOSABLE) ×4 IMPLANT
IV NS IRRIG 3000ML ARTHROMATIC (IV SOLUTION) ×4 IMPLANT
NDL SAFETY ECLIPSE 18X1.5 (NEEDLE) ×1 IMPLANT
NEEDLE 1/2 CIR CATGUT .05X1.09 (NEEDLE) IMPLANT
NEEDLE HYPO 18GX1.5 BLUNT FILL (NEEDLE) ×2 IMPLANT
NEEDLE HYPO 18GX1.5 SHARP (NEEDLE) ×1
NEEDLE HYPO 22GX1.5 SAFETY (NEEDLE) ×2 IMPLANT
NS IRRIG 500ML POUR BTL (IV SOLUTION) ×2 IMPLANT
PACK ARTHROSCOPY DSU (CUSTOM PROCEDURE TRAY) ×2 IMPLANT
PACK BASIN DAY SURGERY FS (CUSTOM PROCEDURE TRAY) ×2 IMPLANT
PAD CLEANER CAUTERY TIP 5X5 (MISCELLANEOUS)
PENCIL BUTTON HOLSTER BLD 10FT (ELECTRODE) ×2 IMPLANT
SET ARTHROSCOPY TUBING (MISCELLANEOUS) ×1
SET ARTHROSCOPY TUBING LN (MISCELLANEOUS) ×1 IMPLANT
SLING ARM IMMOBILIZER LRG (SOFTGOODS) ×2 IMPLANT
SPONGE GAUZE 4X4 12PLY (GAUZE/BANDAGES/DRESSINGS) ×2 IMPLANT
SPONGE LAP 4X18 X RAY DECT (DISPOSABLE) ×2 IMPLANT
SPONGE SURGIFOAM ABS GEL 100 (HEMOSTASIS) IMPLANT
STAPLER VISISTAT 35W (STAPLE) ×2 IMPLANT
STRIP CLOSURE SKIN 1/2X4 (GAUZE/BANDAGES/DRESSINGS) IMPLANT
SUCTION FRAZIER TIP 10 FR DISP (SUCTIONS) ×2 IMPLANT
SUT BONE WAX W31G (SUTURE) ×2 IMPLANT
SUT ETHIBOND GREEN BRAID 0S 4 (SUTURE) IMPLANT
SUT ETHIBOND NAB CT1 #1 30IN (SUTURE) ×4 IMPLANT
SUT ETHILON 4 0 PS 2 18 (SUTURE) ×4 IMPLANT
SUT VIC AB 0 CT1 36 (SUTURE) IMPLANT
SUT VIC AB 1 CT1 36 (SUTURE) ×2 IMPLANT
SUT VIC AB 2-0 CT1 27 (SUTURE) ×1
SUT VIC AB 2-0 CT1 TAPERPNT 27 (SUTURE) ×1 IMPLANT
SUT VIC AB 3-0 CT1 27 (SUTURE)
SUT VIC AB 3-0 CT1 TAPERPNT 27 (SUTURE) IMPLANT
SUT VIC AB 3-0 SH 27 (SUTURE)
SUT VIC AB 3-0 SH 27X BRD (SUTURE) IMPLANT
SUT VICRYL 4-0 PS2 18IN ABS (SUTURE) IMPLANT
SYR BULB IRRIGATION 50ML (SYRINGE) ×2 IMPLANT
SYRINGE 10CC LL (SYRINGE) ×2 IMPLANT
TAPE CLOTH SURG 6X10 WHT LF (GAUZE/BANDAGES/DRESSINGS) ×2 IMPLANT
TAPE HYPAFIX 6X30 (GAUZE/BANDAGES/DRESSINGS) IMPLANT
TOWEL OR 17X24 6PK STRL BLUE (TOWEL DISPOSABLE) ×2 IMPLANT
TUBE CONNECTING 12X1/4 (SUCTIONS) ×2 IMPLANT
WAND 90 DEG TURBOVAC W/CORD (SURGICAL WAND) ×2 IMPLANT
WATER STERILE IRR 500ML POUR (IV SOLUTION) ×2 IMPLANT

## 2012-05-25 NOTE — Progress Notes (Signed)
Dr. Simonne Come informed of order for foley, informed him that pt has no foley.  Order received to d/c order.Marland Kitchen

## 2012-05-25 NOTE — Op Note (Signed)
[name]                   ACCOUNT NO.:  [id number]  MEDICAL RECORD NO.:  [id number]  LOCATION:  1614                         FACILITY:  [facility]  PHYSICIAN:  Marlowe Kays, M.D.  DATE OF BIRTH:  02/16/1936  DATE OF PROCEDURE:  05/25/2012 DATE OF DISCHARGE:                              OPERATIVE REPORT   PREOPERATIVE DIAGNOSES:  All right shoulder: 1. Partial long head biceps tendon tear. 2. Labral tear. 3. Acromioclavicular joint arthritis. 4. Full-thickness retracted rotator cuff tear.  POSTOPERATIVE DIAGNOSES:  All right shoulder: 1. Partial long head biceps tendon tear. 2. Labral tear. 3. Acromioclavicular joint arthritis. 4. Full-thickness retracted rotator cuff tear.  OPERATION: 1. Right shoulder arthroscopy with debridement of the partial biceps     long head of biceps tendon tear and labral tear. 2. Open distal clavicle resection. 3. Open anterior acromionectomy and repair of torn rotator cuff.  SURGEON:  Marlowe Kays, MD.  ASSISTANTDruscilla [name]. [name].  ANESTHESIA:  General preceded by interscalene block.  PATHOLOGY AND JUSTIFICATION FOR PROCEDURE:  Right shoulder pain with an MRI demonstrating the preoperative diagnoses.  Mr. [name] assistance was necessary because of the complexity of the surgery with holding the arm, instrumentation, and generally assisting with the repairs.  PROCEDURE:  Satisfactory interscalene block by anesthesia, satisfied general anesthesia, and.  Prophylactic antibiotics.  Latex precautions. Beach-chair position.  Right shoulder girdle was prepped with DuraPrep, draped in sterile field.  First marked out the anatomy of the shoulder joint after time-out performed, and through posterior soft spot portal, atraumatically entered the glenohumeral joint.  She had a good bit of inflammatory reaction as well as partial tearing of the subscapularis as noted on the MRI, about 50% tear of the long head of biceps tendon  and substantial labral tear throughout most of the labrum.  Accordingly, I then advanced the scope between the biceps tendon subscapularis and using a switching stick, made an anterior incision over the switching stick and then placed a metal cannula and a 4.2 shaver entering the joint.  I then debrided up all the areas of pathology including subscapularis, the long head of biceps tendon leaving about 50% intact and the extensive labral tearing.  Concluding that, I then removed all fluid from the joint, and we stapled the two portals.  I then used Betadine sticky drape and made an open incision over the distal clavicle curving downward over the rotator cuff.  I identified the [facility] joint with a needle and then measured 1.5 cm medial to this and with subperiosteal dissection exposed the clavicle and placed 2 baby Homans beneath the clavicle and used a micro saw to amputate the clavicle at this point removing the cut portion with towel clip and cautery technique.  I then made sure there was no residual spicules on the clavicle, and I then placed bone wax over the raw bone.  I then extended the incision lateralward over the distal acromion and with subperiosteal dissection using cutting cautery.  I exposed the acromion.  I was able to place a small Cobb elevator beneath it and joint fluid immediately came forth. I made my initial anterior acromionectomy, cut with  the micro saw once again, and then removed additional bone subsequently with saw and rongeur to decompress the subacromial joint.  She had a full-thickness, somewhat laminated tear with involving the entire rotator cuff with a number of centimeters of retraction.  I first placing traction lateralward on the rotator cuff placed several #1 Ethibond sutures at the apex of the tear.  This appeared to be more of large anterior flap and larger posterior flap.  Working first thing anteriorly, I used a 4 stranded Stryker anchor after denuding  the articular cartilage lateralward leaving through the anterior half and cinching it down.  I then used a second anchor posteriorly and did the same.  I then used the same sutures to place through the lateral end of the rotator cuff and weaving through the lateral soft tissues; particularly posteriorly. There was still some good lateral based tissue remaining.  I then also placed some individual sutures filling in any apparent open spaces on the repair.  This gave a good solid repair with her arm to her side.  I then irrigated the wound well with sterile saline.  We closed the fascia over the distal clavicle and anterior acromion with interrupted #1 Vicryl, 2-0 Vicryl subcutaneous tissue and once again, staples in the skin.  Betadine, Adaptic, dry sterile dressing and shoulder immobilizer were applied.  She tolerated the procedure well, was taken to recovery room in satisfied condition with no known complications.          ______________________________ Marlowe Kays, M.D.     JA/MEDQ  D:  05/25/2012  T:  05/25/2012  Job:  [id number]

## 2012-05-25 NOTE — H&P (Signed)
[name] is an 77 y.o. female.   Chief Complaint:painful right shoulder HPI:MRI demonstrates partial tear of biceps tendon,labral tear, torn rotator cuff, ac arthritis  Past Medical History  Diagnosis Date  . Hypertension   . GERD (gastroesophageal reflux disease)   . Hyperlipidemia   . Diverticular disease   . Chronic headaches   . Bruises easily   . History of kidney stones   . History of blurred vision   . CAD (coronary artery disease) CARDIOLOGIST- DR Charlton Haws-- LAST VISIT IN EPIC  . Aortic valve stenosis, mild   . Heart murmur   . Spinal stenosis, multilevel   . Spondylosis, cervical   . Fibromyalgia   . Labral tear of long head of biceps tendon RIGHT SHOULDER  . Right rotator cuff tear   . OSA (obstructive sleep apnea) CPAP INTOLERANT   . H/O hypoglycemia   . Eczema     Past Surgical History  Procedure Laterality Date  . Umbilical hernia repair  JUNE 2006  . Vaginal hysterectomy  01-31-2001    ANTERIOR & POSTERIOR REPAIR/ TRANSVAGINAL BLADDER SLING  . Anterior cervical decomp/discectomy fusion  11-11-1999    C5 - C6  . Thyroidectomy  AGE 59    GOITER  . Hemorrhoidectomy with hemorrhoid banding  08-07-2010  . Laparoscopic cholecystectomy  1990  . Appendectomy  AGE 80  . Tonsillectomy and adenoidectomy  AGE 84  . Cataract extraction w/ intraocular lens  implant, bilateral    . Transthoracic echocardiogram  01-20-2012    MILD LVH/ NORMAL LVSF/ EF 60-65%/ MILD AORTIC VALVE STENOSIS/ MILD MV REGURG.  . Cardiac catheterization  2008  DR [facility]    ESSENTIALLY NORMAL    History reviewed. No pertinent family history. Social History:  reports that she has never smoked. She has never used smokeless tobacco. She reports that  drinks alcohol. She reports that she does not use illicit drugs.  Allergies:  Allergies  Allergen Reactions  . Latex Dermatitis and Rash    "with  extended exposure"    Medications Prior to Admission  Medication Sig Dispense Refill  .  Ascorbic Acid (VITAMIN C) 1000 MG tablet Take 1,000 mg by mouth daily.       Marland Kitchen aspirin 81 MG tablet Take 81 mg by mouth daily.       . Calcium-Vitamin D 600-125 MG-UNIT TABS Take 1 tablet by mouth at bedtime.       . fish oil-omega-3 fatty acids 1000 MG capsule Take 2 g by mouth daily.      Marland Kitchen gabapentin (NEURONTIN) 300 MG capsule Take 900 mg by mouth 3 (three) times daily.      Marland Kitchen ketorolac (TORADOL) 10 MG tablet Take 10 mg by mouth every 6 (six) hours as needed for pain.      . metoprolol succinate (TOPROL-XL) 100 MG 24 hr tablet Take 100 mg by mouth every morning. Take with or immediately following a meal.      . omeprazole (PRILOSEC) 20 MG capsule Take 20 mg by mouth every evening.      . pravastatin (PRAVACHOL) 40 MG tablet Take 40 mg by mouth every evening.       Marland Kitchen PRESCRIPTION MEDICATION Apply topically as needed (TOPICAL CREAM FOR ECZEMA).      Marland Kitchen tiZANidine (ZANAFLEX) 4 MG capsule Take 4 mg by mouth 3 (three) times daily.        Results for orders placed during the hospital encounter of 05/25/12 (from the past 48 hour(s))  POCT I-STAT 4, (NA,K, GLUC, HGB,HCT)     Status: None   Collection Time    05/25/12  8:50 AM      Result Value Range   Sodium 138  135 - 145 mEq/L   Potassium 4.9  3.5 - 5.1 mEq/L   Glucose, Bld 95  70 - 99 mg/dL   HCT 09.8  11.9 - 14.7 %   Hemoglobin 13.6  12.0 - 15.0 g/dL   No results found.  ROS  Blood pressure 144/58, pulse 57, temperature 97.8 F (36.6 C), temperature source Oral, resp. rate 18, height 5\' 4"  (1.626 m), weight 71.668 kg (158 lb), SpO2 99.00%. Physical Exam  Constitutional: She is oriented to person, place, and time. She appears well-developed and well-nourished.  HENT:  Head: Normocephalic and atraumatic.  Right Ear: External ear normal.  Left Ear: External ear normal.  Eyes: Conjunctivae and EOM are normal. Pupils are equal, round, and reactive to light.  Neck: Normal range of motion. Neck supple.  Cardiovascular: Normal rate,  regular rhythm, normal heart sounds and intact distal pulses.   Respiratory: Effort normal and breath sounds normal.  GI: Soft. Bowel sounds are normal.  Musculoskeletal:  She has had an interscalene block rt shoulder  Neurological: She is alert and oriented to person, place, and time. She has normal reflexes.  Skin: Skin is warm and dry.  Psychiatric: She has a normal mood and affect. Her behavior is normal. Judgment and thought content normal.     Assessment/Plan Painful rt shoulder due to labral and biceps tendon tears, rotator cuff tear and ac arthritis 1. Arthroscopy with debride ment of labrum and biceps tendon 2.open distal clavicle resection 3. Open anterior acromionectomy and repair torn rotator cuff  APLINGTON,JAMES P 05/25/2012, 9:54 AM

## 2012-05-25 NOTE — Progress Notes (Signed)
Pt oob w assis to br.  Voided.  Tolerated being up well.  Dr. Renold Don in and talked w pt after her return to stretcher.

## 2012-05-25 NOTE — Brief Op Note (Signed)
05/25/2012  2:24 PM  PATIENT:  [name]  77 y.o. female  PRE-OPERATIVE DIAGNOSIS:  PAINFUL RIGHT SHOULDER WITH  LABRAL BICEP TENDON TEAR ACROMIOCLAVICULAR ARTHRITIS AND ROTATOR CUFF TEAR ON MRI  POST-OPERATIVE DIAGNOSIS:  PAINFUL RIGHT SHOULDER WITH  LABRAL BICEP TENDON TEAR ACROMIOCLAVICULAR ARTHRITIS AND ROTATOR CUFF TEAR ON MRI  PROCEDURE:  Procedure(s) with comments: SHOULDER ARTHROSCOPY WITH OPEN ROTATOR CUFF REPAIR AND DISTAL CLAVICLE ACROMINECTOMY (Right) - RIGHT SHOULDER ARTHROSCOPY WITH DERBRIDEMENTOF LABRAL/BICEP TENDON, OPEN DISTAL CLAVICLE RESECTION, ANTERIOR ACROMINECTOMY ROTATOR CUFF REPAIR ANESTHESIA: GENERAL/SCALENE NERVE BLOCK  SURGEON:  Surgeon(s) and Role:    * Drucilla Schmidt, MD - Primary  PHYSICIAN ASSISTANT:   ASSISTANTS: Mr Idolina Primer [facility]  ANESTHESIA:   regional and general  EBL:  Total I/O In: 1300 [I.V.:1300] Out: 600 [Urine:600]  BLOOD ADMINISTERED:none  DRAINS: none   LOCAL MEDICATIONS USED:  MARCAINE     SPECIMEN:  No Specimen  DISPOSITION OF SPECIMEN:  N/A  COUNTS:  YES  TOURNIQUET:  * No tourniquets in log *  DICTATION: .Other Dictation: Dictation Number [id number]  PLAN OF CARE: Admit for overnight observation  PATIENT DISPOSITION:  PACU - hemodynamically stable.   Delay start of Pharmacological VTE agent (>24hrs) due to surgical blood loss or risk of bleeding: yes

## 2012-05-25 NOTE — Anesthesia Postprocedure Evaluation (Signed)
Anesthesia Post Note  Patient: [name]  Procedure(s) Performed: Procedure(s) (LRB): SHOULDER ARTHROSCOPY WITH OPEN ROTATOR CUFF REPAIR AND DISTAL CLAVICLE ACROMINECTOMY (Right)  Anesthesia type: General with interscalene block.  Patient location: PACU  Post pain: Pain level controlled  Post assessment: Post-op Vital signs reviewed  Last Vitals: BP 180/75  Pulse 85  Temp(Src) 36.2 C (Oral)  Resp 16  Ht 5\' 4"  (1.626 m)  Wt 158 lb (71.668 kg)  BMI 27.11 kg/m2  SpO2 91%  Post vital signs: Reviewed  Level of consciousness: sedated  Complications: No apparent anesthesia complications  PACU course: Pt complaining of some dysphagia/dysphonia prior to induction of anesthesia. She was noted to have horner's on right from local spread of ISB. Pt without dyspnea or hypoxemia postop. VSS. She was slightly dysphonic and horner's noted on right. She will being staying overnight. I discussed in depth with pt what the cause of her discomfort was. She was not having any surgical pain. No other issues.

## 2012-05-25 NOTE — Anesthesia Procedure Notes (Addendum)
Anesthesia Regional Block:  Interscalene brachial plexus block  Pre-Anesthetic Checklist: ,, timeout performed, Correct Patient, Correct Site, Correct Laterality, Correct Procedure, Correct Position, site marked, Risks and benefits discussed,  Surgical consent,  Pre-op evaluation,  At surgeon's request and post-op pain management  Laterality: Right  Prep: chloraprep       Needles:  Injection technique: Single-shot  Needle Type: Stimiplex          Additional Needles:  Procedures: ultrasound guided (picture in chart) and nerve stimulator Interscalene brachial plexus block  Nerve Stimulator or Paresthesia:  Response: Deltoid, 0.5 mA, 0.1 ms,   Additional Responses:   Narrative:  Start time: 05/25/2012 9:05 AM End time: 05/25/2012 9:12 AM Injection made incrementally with aspirations every 5 mL.  Performed by: Personally  Anesthesiologist: Germeroth  Additional Notes: Patient tolerated the procedure well without complications  Interscalene brachial plexus block Procedure Name: Intubation Date/Time: 05/25/2012 10:26 AM Performed by: Renella Cunas D Pre-anesthesia Checklist: Patient identified, Emergency Drugs available, Suction available and Patient being monitored Patient Re-evaluated:Patient Re-evaluated prior to inductionOxygen Delivery Method: Circle System Utilized Preoxygenation: Pre-oxygenation with 100% oxygen Intubation Type: IV induction Ventilation: Mask ventilation without difficulty Laryngoscope Size: Mac and 3 Grade View: Grade II Tube type: Oral Tube size: 7.0 mm Number of attempts: 1 Airway Equipment and Method: stylet,  oral airway and LTA kit utilized Placement Confirmation: ETT inserted through vocal cords under direct vision,  positive ETCO2 and breath sounds checked- equal and bilateral Secured at: 21 cm Tube secured with: Tape Dental Injury: Teeth and Oropharynx as per pre-operative assessment

## 2012-05-25 NOTE — Progress Notes (Signed)
Dr. Renold Don at bedside and explained to pt the reason for her current throat irritation. Pt verbalized her understanding.

## 2012-05-25 NOTE — Transfer of Care (Signed)
  Immediate Anesthesia Transfer of Care Note  Patient: [name]  Procedure(s) Performed: Procedure(s) (LRB): SHOULDER ARTHROSCOPY WITH OPEN ROTATOR CUFF REPAIR AND DISTAL CLAVICLE ACROMINECTOMY (Right)  Patient Location: PACU  Anesthesia Type: General  Level of Consciousness: awake, oriented, sedated and patient cooperative  Airway & Oxygen Therapy: Patient Spontanous Breathing and Patient connected to face mask oxygen  Post-op Assessment: Report given to PACU RN and Post -op Vital signs reviewed and stable  Post vital signs: Reviewed and stable  Complications: No apparent anesthesia complications

## 2012-05-25 NOTE — Anesthesia Preprocedure Evaluation (Addendum)
Anesthesia Evaluation  Patient identified by MRN, date of birth, ID band Patient awake    Reviewed: Allergy & Precautions, H&P , NPO status , Patient's Chart, lab work & pertinent test results, reviewed documented beta blocker date and time   Airway Mallampati: II TM Distance: >3 FB Neck ROM: Limited    Dental  (+) Dental Advisory Given and Teeth Intact   Pulmonary neg pulmonary ROS, sleep apnea ,  breath sounds clear to auscultation  Pulmonary exam normal       Cardiovascular hypertension, Pt. on medications and Pt. on home beta blockers + CAD and + Peripheral Vascular Disease + Valvular Problems/Murmurs AS Rhythm:Regular Rate:Normal     Neuro/Psych  Headaches, negative psych ROS   GI/Hepatic Neg liver ROS, GERD-  Medicated,  Endo/Other  negative endocrine ROS  Renal/GU negative Renal ROS     Musculoskeletal negative musculoskeletal ROS (+) Fibromyalgia -, narcotic dependent  Abdominal   Peds  Hematology negative hematology ROS (+)   Anesthesia Other Findings   Reproductive/Obstetrics                          Anesthesia Physical Anesthesia Plan  ASA: III  Anesthesia Plan: Regional and General   Post-op Pain Management:    Induction: Intravenous  Airway Management Planned: Oral ETT  Additional Equipment:   Intra-op Plan:   Post-operative Plan: Extubation in OR  Informed Consent: I have reviewed the patients History and Physical, chart, labs and discussed the procedure including the risks, benefits and alternatives for the proposed anesthesia with the patient or authorized representative who has indicated his/her understanding and acceptance.   Dental advisory given  Plan Discussed with: CRNA  Anesthesia Plan Comments:         Anesthesia Quick Evaluation

## 2012-05-26 MED ORDER — DIPHENHYDRAMINE HCL 12.5 MG/5ML PO ELIX: 12.5000 mg | ORAL_SOLUTION | Freq: Four times a day (QID) | ORAL | Status: DC | PRN

## 2012-05-26 MED ORDER — HYDROMORPHONE 0.3 MG/ML IV SOLN
INTRAVENOUS | Status: DC
  Administered 2012-05-26: 11:00:00 via INTRAVENOUS
  Administered 2012-05-26: 1.7 mg via INTRAVENOUS
  Administered 2012-05-26: 2.1 mg via INTRAVENOUS
  Administered 2012-05-26: 2.7 mg via INTRAVENOUS
  Administered 2012-05-27: 1.8 mg via INTRAVENOUS
  Filled 2012-05-26 (×2): qty 25

## 2012-05-26 MED ORDER — SODIUM CHLORIDE 0.9 % IJ SOLN: 9.0000 mL | INTRAMUSCULAR | Status: DC | PRN

## 2012-05-26 MED ORDER — IBUPROFEN 400 MG PO TABS
400.0000 mg | ORAL_TABLET | ORAL | Status: DC | PRN
  Filled 2012-05-26: qty 1

## 2012-05-26 MED ORDER — ONDANSETRON HCL 4 MG/2ML IJ SOLN: 4.0000 mg | Freq: Four times a day (QID) | INTRAMUSCULAR | Status: DC | PRN

## 2012-05-26 MED ORDER — NALOXONE HCL 0.4 MG/ML IJ SOLN: 0.4000 mg | INTRAMUSCULAR | Status: DC | PRN

## 2012-05-26 MED ORDER — DIPHENHYDRAMINE HCL 50 MG/ML IJ SOLN: 12.5000 mg | Freq: Four times a day (QID) | INTRAMUSCULAR | Status: DC | PRN

## 2012-05-26 MED ORDER — DIPHENHYDRAMINE HCL 25 MG PO CAPS
50.0000 mg | ORAL_CAPSULE | Freq: Four times a day (QID) | ORAL | Status: DC
  Administered 2012-05-26 – 2012-05-27 (×3): 50 mg via ORAL
  Filled 2012-05-26 (×4): qty 2
  Filled 2012-05-26: qty 1
  Filled 2012-05-26: qty 2

## 2012-05-26 NOTE — Progress Notes (Signed)
Patient ID: [name], female   DOB: 1936-03-07, 77 y.o.   MRN: [id number] POD 1--3problems: pain control, bad heache,possible drug reaction.  Her face is very red.  Have placed her on dilaudid PCA and will gibe her ibuprofen and benadryl.

## 2012-05-27 ENCOUNTER — Encounter (HOSPITAL_BASED_OUTPATIENT_CLINIC_OR_DEPARTMENT_OTHER): Payer: Self-pay | Admitting: Orthopedic Surgery

## 2012-05-27 MED ORDER — HYDROCODONE-ACETAMINOPHEN 10-325 MG PO TABS: 1.0000 | ORAL_TABLET | Freq: Four times a day (QID) | ORAL | Status: DC | PRN

## 2012-05-27 MED ORDER — METHOCARBAMOL 500 MG PO TABS: 500.0000 mg | ORAL_TABLET | Freq: Four times a day (QID) | ORAL | Status: DC | PRN

## 2012-05-27 NOTE — Progress Notes (Signed)
Pt to d/c home. AVS reviewed and "My Chart" discussed with pt. Pt capable of verbalizing medications and follow-up appointments. Remains hemodynamically stable. No signs and symptoms of distress. Educated pt to return to ER in the case of SOB, dizziness, or chest pain.  

## 2012-05-27 NOTE — Progress Notes (Signed)
Patient ID: [name], female   DOB: 1936-02-21, 77 y.o.   MRN: [id number] Pain --ok on po meds.  Prior probs from yesterday have resolved.  Wounds clean and dry.  She was shown ho to bathe, dress and undress.  Will d/c on norco and flexeril. Office 2 weeks from surgery.

## 2012-05-27 NOTE — Discharge Summary (Signed)
[name], [name]                   ACCOUNT NO.:  [id number]  MEDICAL RECORD NO.:  [id number]  LOCATION:  1614                         FACILITY:  [facility]  PHYSICIAN:  Marlowe Kays, M.D.  DATE OF BIRTH:  1935-06-13  DATE OF ADMISSION:  05/25/2012 DATE OF DISCHARGE:  05/27/2012                              DISCHARGE SUMMARY   ADMITTING DIAGNOSIS:  Right shoulder: 1. Torn labrum. 2. Partial tear long head biceps tendon. 3. Acromioclavicular joint arthritis and  complete retracted     rotator cuff tear.  DISCHARGE DIAGNOSIS:  Right shoulder: 1. Torn labrum. 2. Partial tear long head biceps tendon. 3. Acromioclavicular joint arthritis and  complete retracted     rotator cuff tear.  OPERATION:  On May 25, 2012: 1. Right shoulder arthroscopy with debridement of labrum and long head     of biceps tendon. 2. Open distal clavicle resection. 3. Open anterior acromionectomy, repair of torn rotator cuff.  SURGEON:  Marlowe Kays, MD.  HOSPITAL COURSE:  This lady has had progressive pain in the right shoulder with an MRI demonstrating the admitting diagnoses.  Her preoperative lab work was all within normal limits.  The operation as noted above proceeded uneventfully.  She required two nights in the hospital because of significant pain.  At the time of discharge, was comfortable on oral pain medication.  She was discharged on Norco 10/325 and Robaxin 500.  She was also asked to take Benadryl 50 mg every 6 hours for any symptoms of allergic reaction and ibuprofen for headaches. Prior to discharge, she was given instructions in bathing with wound healing nicely today, dressing and undressing and use of the shoulder immobilizer.  She is to return to my office for follow up 2 weeks from surgery with instructions to call if there are any problems.          ______________________________ Marlowe Kays, M.D.     JA/MEDQ  D:  05/27/2012  T:  05/27/2012  Job:  [id number]

## 2012-05-30 NOTE — Progress Notes (Signed)
Discharge summary sent to payer through [facility]  

## 2012-07-02 ENCOUNTER — Other Ambulatory Visit: Payer: Self-pay | Admitting: Nurse Practitioner

## 2012-07-04 ENCOUNTER — Other Ambulatory Visit: Payer: Self-pay | Admitting: *Deleted

## 2012-07-04 MED ORDER — METOPROLOL TARTRATE 50 MG PO TABS: 50.0000 mg | ORAL_TABLET | Freq: Two times a day (BID) | ORAL | Status: DC

## 2012-07-05 NOTE — Telephone Encounter (Signed)
Please clarify dose on gabapentin

## 2012-08-01 ENCOUNTER — Other Ambulatory Visit: Payer: Self-pay | Admitting: Physician Assistant

## 2012-08-02 ENCOUNTER — Other Ambulatory Visit: Payer: Self-pay | Admitting: *Deleted

## 2012-08-02 MED ORDER — PRAVASTATIN SODIUM 40 MG PO TABS: 40.0000 mg | ORAL_TABLET | Freq: Every evening | ORAL | Status: DC

## 2012-08-04 ENCOUNTER — Ambulatory Visit: Payer: Medicare Other | Attending: Orthopedic Surgery | Admitting: Physical Therapy

## 2012-08-04 DIAGNOSIS — M25619 Stiffness of unspecified shoulder, not elsewhere classified: Secondary | ICD-10-CM | POA: Insufficient documentation

## 2012-08-04 DIAGNOSIS — IMO0001 Reserved for inherently not codable concepts without codable children: Secondary | ICD-10-CM | POA: Insufficient documentation

## 2012-08-04 DIAGNOSIS — R5381 Other malaise: Secondary | ICD-10-CM | POA: Insufficient documentation

## 2012-08-04 DIAGNOSIS — M25519 Pain in unspecified shoulder: Secondary | ICD-10-CM | POA: Insufficient documentation

## 2012-08-11 ENCOUNTER — Encounter: Payer: Medicare Other | Admitting: Physical Therapy

## 2012-08-22 ENCOUNTER — Ambulatory Visit: Payer: Medicare Other | Attending: Orthopedic Surgery | Admitting: Physical Therapy

## 2012-08-22 DIAGNOSIS — M25619 Stiffness of unspecified shoulder, not elsewhere classified: Secondary | ICD-10-CM | POA: Insufficient documentation

## 2012-08-22 DIAGNOSIS — R5381 Other malaise: Secondary | ICD-10-CM | POA: Insufficient documentation

## 2012-08-22 DIAGNOSIS — IMO0001 Reserved for inherently not codable concepts without codable children: Secondary | ICD-10-CM | POA: Insufficient documentation

## 2012-08-22 DIAGNOSIS — M25519 Pain in unspecified shoulder: Secondary | ICD-10-CM | POA: Insufficient documentation

## 2012-08-30 ENCOUNTER — Ambulatory Visit: Payer: Medicare Other | Admitting: Physical Therapy

## 2012-09-06 ENCOUNTER — Ambulatory Visit: Payer: Medicare Other | Admitting: Physical Therapy

## 2012-09-07 ENCOUNTER — Ambulatory Visit (INDEPENDENT_AMBULATORY_CARE_PROVIDER_SITE_OTHER): Payer: Medicare Other | Admitting: Nurse Practitioner

## 2012-09-07 ENCOUNTER — Encounter: Payer: Self-pay | Admitting: Nurse Practitioner

## 2012-09-07 VITALS — BP 132/68 | HR 63 | Temp 97.8°F | Ht 62.0 in | Wt 162.0 lb

## 2012-09-07 DIAGNOSIS — M797 Fibromyalgia: Secondary | ICD-10-CM

## 2012-09-07 DIAGNOSIS — E785 Hyperlipidemia, unspecified: Secondary | ICD-10-CM

## 2012-09-07 DIAGNOSIS — IMO0001 Reserved for inherently not codable concepts without codable children: Secondary | ICD-10-CM

## 2012-09-07 DIAGNOSIS — I10 Essential (primary) hypertension: Secondary | ICD-10-CM

## 2012-09-07 LAB — COMPLETE METABOLIC PANEL WITH GFR
ALT: 17 U/L (ref 0–35)
CO2: 29 mEq/L (ref 19–32)
Calcium: 9.3 mg/dL (ref 8.4–10.5)
Chloride: 104 mEq/L (ref 96–112)
GFR, Est African American: >89 mL/min
Sodium: 141 mEq/L (ref 135–145)
Total Bilirubin: 0.4 mg/dL (ref 0.3–1.2)
Total Protein: 6.6 g/dL (ref 6.0–8.3)

## 2012-09-07 MED ORDER — GABAPENTIN 600 MG PO TABS: 600.0000 mg | ORAL_TABLET | Freq: Three times a day (TID) | ORAL | Status: DC

## 2012-09-07 NOTE — Patient Instructions (Signed)

## 2012-09-07 NOTE — Progress Notes (Signed)
Subjective:    Patient ID: [name], female    DOB: [birth year], 77 y.o.   MRN: [id number]  Hyperlipidemia This is a chronic problem. The current episode started more than 1 year ago. The problem is uncontrolled. Recent lipid tests were reviewed and are high. There are no known factors aggravating her hyperlipidemia. Pertinent negatives include no chest pain, leg pain, myalgias or shortness of breath. Current antihyperlipidemic treatment includes statins. The current treatment provides moderate improvement of lipids. Compliance problems include adherence to diet and adherence to exercise.  Risk factors for coronary artery disease include hypertension, obesity and post-menopausal.  Gastrophageal Reflux She reports no chest pain. This is a chronic problem. The current episode started more than 1 year ago. The problem occurs rarely. Nothing aggravates the symptoms. The treatment provided significant relief.  Hypertension This is a chronic problem. The current episode started more than 1 year ago. The problem is unchanged. The problem is controlled. Associated symptoms include peripheral edema. Pertinent negatives include no anxiety, chest pain, palpitations or shortness of breath. There are no associated agents to hypertension. Risk factors for coronary artery disease include obesity, post-menopausal state and dyslipidemia. Past treatments include beta blockers. The current treatment provides significant improvement. Compliance problems include diet and exercise.   lower extremity cramping/Fibromyalgia neurotin helps a lot- No c/o side effects Vitamin d deficiency  Vitamin D OTC- No side effects  Review of Systems  Constitutional: Negative.   HENT: Negative.   Eyes: Negative.   Respiratory: Negative for shortness of breath.   Cardiovascular: Negative for chest pain and palpitations.  Gastrointestinal: Negative.   Genitourinary: Negative.   Musculoskeletal: Negative.  Negative for myalgias.   Neurological: Negative.   Hematological: Negative.   Psychiatric/Behavioral: Negative.        Objective:   Physical Exam  Constitutional: She is oriented to person, place, and time. She appears well-developed and well-nourished.  HENT:  Nose: Nose normal.  Mouth/Throat: Oropharynx is clear and moist.  Eyes: EOM are normal.  Neck: Trachea normal, normal range of motion and full passive range of motion without pain. Neck supple. No JVD present. Carotid bruit is not present. No thyromegaly present.  Cardiovascular: Normal rate, regular rhythm and intact distal pulses.  Exam reveals no gallop and no friction rub.   Murmur (2/6 systolic) heard. Pulmonary/Chest: Effort normal and breath sounds normal.  Abdominal: Soft. Bowel sounds are normal. She exhibits no distension and no mass. There is no tenderness.  Musculoskeletal: Normal range of motion.  Lymphadenopathy:    She has no cervical adenopathy.  Neurological: She is alert and oriented to person, place, and time. She has normal reflexes.  Skin: Skin is warm and dry.  Psychiatric: She has a normal mood and affect. Her behavior is normal. Judgment and thought content normal.   BP 132/68  Pulse 63  Temp(Src) 97.8 F (36.6 C) (Oral)  Ht 5\' 2"  (1.575 m)  Wt 162 lb (73.483 kg)  BMI 29.62 kg/m2        Assessment & Plan:   1. Other and unspecified hyperlipidemia   2. HTN (hypertension)   3. Fibromyalgia    Orders Placed This Encounter  Procedures  . COMPLETE METABOLIC PANEL WITH GFR  . NMR Lipoprofile with Lipids  . Vitamin D 25 hydroxy     Medication List       These changes are accurate as of: 09/07/2012 10:08 AM. If you have any questions, ask your nurse or doctor.  STOP taking these medications       gabapentin 300 MG capsule  Commonly known as:  NEURONTIN  Stopped by:  Bennie Pierini, FNP     HYDROcodone-acetaminophen 10-325 MG per tablet  Commonly known as:  NORCO  Stopped by:   Bennie Pierini, FNP     methocarbamol 500 MG tablet  Commonly known as:  ROBAXIN  Stopped by:  Bennie Pierini, FNP     metoprolol 50 MG tablet  Commonly known as:  LOPRESSOR  Stopped by:  Bennie Pierini, FNP     tiZANidine 4 MG capsule  Commonly known as:  ZANAFLEX  Stopped by:  Bennie Pierini, FNP      TAKE these medications       aspirin 81 MG tablet  Take 81 mg by mouth daily.     Calcium-Vitamin D 600-125 MG-UNIT Tabs  Take 1 tablet by mouth at bedtime.     fish oil-omega-3 fatty acids 1000 MG capsule  Take 2 g by mouth daily.     metoprolol succinate 100 MG 24 hr tablet  Commonly known as:  TOPROL-XL  Take 100 mg by mouth every morning. Take with or immediately following a meal.     omeprazole 20 MG capsule  Commonly known as:  PRILOSEC  Take 20 mg by mouth every evening.     pravastatin 40 MG tablet  Commonly known as:  PRAVACHOL  Take 1 tablet (40 mg total) by mouth every evening.     PRESCRIPTION MEDICATION  Apply topically as needed (TOPICAL CREAM FOR ECZEMA).     vitamin C 1000 MG tablet  Take 1,000 mg by mouth daily.      diet and exercise encouraged Follow/up in 3 months Mary-Margaret Daphine Deutscher, FNP

## 2012-09-08 LAB — VITAMIN D 25 HYDROXY (VIT D DEFICIENCY, FRACTURES): Vit D, 25-Hydroxy: 36 ng/mL (ref 30–89)

## 2012-09-09 LAB — NMR LIPOPROFILE WITH LIPIDS
Cholesterol, Total: 219 mg/dL — ABNORMAL HIGH (ref <200)
HDL Particle Number: 38.6 umol/L (ref >=30.5)
HDL-C: 47 mg/dL (ref >=40)
LDL Size: 19.7 nm — ABNORMAL LOW (ref >20.5)
Large HDL-P: 5 umol/L (ref >=4.8)

## 2012-09-12 ENCOUNTER — Other Ambulatory Visit: Payer: Self-pay | Admitting: Nurse Practitioner

## 2012-09-12 MED ORDER — ATORVASTATIN CALCIUM 40 MG PO TABS: 40.0000 mg | ORAL_TABLET | Freq: Every day | ORAL | Status: DC

## 2012-09-12 NOTE — Progress Notes (Signed)
Quick Note:  Patient aware of labs. She will agree to try something as long as it is not too expexsive ______

## 2012-09-15 ENCOUNTER — Encounter: Payer: Medicare Other | Admitting: Physical Therapy

## 2012-09-29 ENCOUNTER — Other Ambulatory Visit: Payer: Self-pay | Admitting: Family Medicine

## 2012-10-01 ENCOUNTER — Other Ambulatory Visit: Payer: Self-pay | Admitting: Physician Assistant

## 2012-11-11 ENCOUNTER — Other Ambulatory Visit: Payer: Self-pay | Admitting: Physician Assistant

## 2012-12-30 ENCOUNTER — Other Ambulatory Visit: Payer: Self-pay | Admitting: *Deleted

## 2012-12-30 MED ORDER — OMEPRAZOLE 20 MG PO CPDR: 20.0000 mg | DELAYED_RELEASE_CAPSULE | Freq: Every evening | ORAL | Status: DC

## 2013-01-12 ENCOUNTER — Other Ambulatory Visit: Payer: Self-pay | Admitting: Nurse Practitioner

## 2013-01-14 ENCOUNTER — Telehealth: Payer: Self-pay | Admitting: Nurse Practitioner

## 2013-01-30 ENCOUNTER — Ambulatory Visit: Payer: Medicare Other

## 2013-02-01 ENCOUNTER — Ambulatory Visit (INDEPENDENT_AMBULATORY_CARE_PROVIDER_SITE_OTHER): Payer: Medicare Other

## 2013-02-01 DIAGNOSIS — Z23 Encounter for immunization: Secondary | ICD-10-CM

## 2013-02-08 ENCOUNTER — Ambulatory Visit: Payer: Medicare Other | Admitting: Cardiovascular Disease

## 2013-02-12 ENCOUNTER — Other Ambulatory Visit: Payer: Self-pay | Admitting: Nurse Practitioner

## 2013-02-14 NOTE — Telephone Encounter (Signed)
Last seen 09/07/12  MMM  Requesting a 90 day supply

## 2013-02-17 ENCOUNTER — Ambulatory Visit (INDEPENDENT_AMBULATORY_CARE_PROVIDER_SITE_OTHER): Payer: Medicare Other | Admitting: Nurse Practitioner

## 2013-02-17 ENCOUNTER — Encounter: Payer: Self-pay | Admitting: Nurse Practitioner

## 2013-02-17 VITALS — BP 124/59 | HR 61 | Temp 97.7°F | Ht 62.0 in | Wt 160.0 lb

## 2013-02-17 DIAGNOSIS — E785 Hyperlipidemia, unspecified: Secondary | ICD-10-CM

## 2013-02-17 DIAGNOSIS — Z0000 Encounter for general adult medical examination without abnormal findings: Secondary | ICD-10-CM

## 2013-02-17 DIAGNOSIS — Z01419 Encounter for gynecological examination (general) (routine) without abnormal findings: Secondary | ICD-10-CM

## 2013-02-17 DIAGNOSIS — I10 Essential (primary) hypertension: Secondary | ICD-10-CM

## 2013-02-17 DIAGNOSIS — M797 Fibromyalgia: Secondary | ICD-10-CM

## 2013-02-17 DIAGNOSIS — IMO0001 Reserved for inherently not codable concepts without codable children: Secondary | ICD-10-CM

## 2013-02-17 DIAGNOSIS — Z124 Encounter for screening for malignant neoplasm of cervix: Secondary | ICD-10-CM

## 2013-02-17 LAB — POCT CBC
Lymph, poc: 2.4 (ref 0.6–3.4)
MCH, POC: 28.7 pg (ref 27–31.2)
MCHC: 32.3 g/dL (ref 31.8–35.4)
MCV: 89 fL (ref 80–97)
MPV: 9.6 fL (ref 0–99.8)
POC LYMPH PERCENT: 28.3 %L (ref 10–50)
Platelet Count, POC: 240 10*3/uL (ref 142–424)
RDW, POC: 12.8 %
WBC: 8.4 10*3/uL (ref 4.6–10.2)

## 2013-02-17 LAB — POCT URINALYSIS DIPSTICK
Bilirubin, UA: NEGATIVE
Glucose, UA: NEGATIVE
Leukocytes, UA: NEGATIVE
Nitrite, UA: NEGATIVE

## 2013-02-17 MED ORDER — METOPROLOL TARTRATE 50 MG PO TABS: 50.0000 mg | ORAL_TABLET | Freq: Two times a day (BID) | ORAL | Status: DC

## 2013-02-17 MED ORDER — OMEPRAZOLE 20 MG PO CPDR: 20.0000 mg | DELAYED_RELEASE_CAPSULE | Freq: Every evening | ORAL | Status: DC

## 2013-02-17 MED ORDER — CLOBETASOL PROPIONATE 0.05 % EX CREA: 1.0000 "application " | TOPICAL_CREAM | Freq: Two times a day (BID) | CUTANEOUS | Status: DC

## 2013-02-17 MED ORDER — ATORVASTATIN CALCIUM 40 MG PO TABS: 40.0000 mg | ORAL_TABLET | Freq: Every day | ORAL | Status: DC

## 2013-02-17 MED ORDER — GABAPENTIN 600 MG PO TABS: 600.0000 mg | ORAL_TABLET | Freq: Three times a day (TID) | ORAL | Status: DC

## 2013-02-17 NOTE — Progress Notes (Signed)
Subjective:    Patient ID: [name], female    DOB: April 24, 1935, 77 y.o.   MRN: [id number]  HPI  Patient in today for CPE and PAP- She is doing well- without complaints- Her only complaint is pain on right side with intercourse. Patient Active Problem List   Diagnosis Date Noted  . Bruit 01/14/2012  . HYPERLIPIDEMIA 01/13/2010  . ANEMIA-UNSPECIFIED 01/13/2010  . HEMORRHOIDS, INTERNAL 01/13/2010  . HEMORRHOIDS, EXTERNAL 01/13/2010  . DIVERTICULOSIS-COLON 01/13/2010  . CONSTIPATION 01/13/2010  . RECTAL BLEEDING 01/13/2010  . FIBROMYALGIA 01/13/2010  . PERSONAL HISTORY OF COLONIC POLYPS 01/13/2010  . HYPERCHOLESTEROLEMIA  IIA 08/28/2008  . HYPERTENSION, UNSPECIFIED 08/28/2008  . AORTIC STENOSIS, MILD 08/28/2008  . GERD 08/28/2008  . PALPITATIONS 08/28/2008  . CHEST PAIN-UNSPECIFIED 08/28/2008   Outpatient Encounter Prescriptions as of 02/17/2013  Medication Sig  . aspirin 81 MG tablet Take 81 mg by mouth daily.   Marland Kitchen atorvastatin (LIPITOR) 40 MG tablet TAKE ONE TABLET BY MOUTH ONE TIME DAILY  . Calcium-Vitamin D 600-125 MG-UNIT TABS Take 1 tablet by mouth at bedtime.   . clobetasol cream (TEMOVATE) 0.05 % Apply 1 application topically 2 (two) times daily.  Marland Kitchen gabapentin (NEURONTIN) 600 MG tablet Take 1 tablet (600 mg total) by mouth 3 (three) times daily.  . metoprolol (LOPRESSOR) 50 MG tablet TAKE ONE TABLET BY MOUTH TWICE DAILY  . omeprazole (PRILOSEC) 20 MG capsule Take 1 capsule (20 mg total) by mouth every evening.  . [DISCONTINUED] metoprolol succinate (TOPROL-XL) 100 MG 24 hr tablet Take 100 mg by mouth every morning. Take with or immediately following a meal.  . [DISCONTINUED] Ascorbic Acid (VITAMIN C) 1000 MG tablet Take 1,000 mg by mouth daily.   . [DISCONTINUED] fish oil-omega-3 fatty acids 1000 MG capsule Take 2 g by mouth daily.  . [DISCONTINUED] pravastatin (PRAVACHOL) 40 MG tablet TAKE 1 TABLET (40 MG TOTAL) BY MOUTH EVERY EVENING.  . [DISCONTINUED] PRESCRIPTION  MEDICATION Apply topically as needed (TOPICAL CREAM FOR ECZEMA).        Review of Systems  Constitutional: Negative.   HENT: Negative.   Eyes: Negative.   Respiratory: Negative.   Cardiovascular: Negative.   Gastrointestinal: Negative.   Endocrine: Negative.   Genitourinary: Negative.   Musculoskeletal: Negative.   Neurological: Negative.   Hematological: Negative.   Psychiatric/Behavioral: Negative.        Objective:   Physical Exam  Constitutional: She is oriented to person, place, and time. She appears well-developed and well-nourished.  HENT:  Head: Normocephalic.  Right Ear: Hearing, tympanic membrane, external ear and ear canal normal.  Left Ear: Hearing, tympanic membrane, external ear and ear canal normal.  Nose: Nose normal.  Mouth/Throat: Uvula is midline and oropharynx is clear and moist.  Eyes: Conjunctivae and EOM are normal. Pupils are equal, round, and reactive to light.  Neck: Normal range of motion and full passive range of motion without pain. Neck supple. No JVD present. Carotid bruit is not present. No mass and no thyromegaly present.  Cardiovascular: Normal rate, normal heart sounds and intact distal pulses.   No murmur heard. Pulmonary/Chest: Effort normal and breath sounds normal. Right breast exhibits no inverted nipple, no mass, no nipple discharge, no skin change and no tenderness. Left breast exhibits no inverted nipple, no mass, no nipple discharge, no skin change and no tenderness.  Abdominal: Soft. Bowel sounds are normal. She exhibits no mass. There is no tenderness.  Genitourinary: Vagina normal and uterus normal. Guaiac negative stool. No breast swelling,  tenderness, discharge or bleeding. No vaginal discharge found.  bimanual exam-No adnexal masses or tenderness. Vaginal cuff intact  Musculoskeletal: Normal range of motion.  Lymphadenopathy:    She has no cervical adenopathy.  Neurological: She is alert and oriented to person, place, and  time.  Skin: Skin is warm and dry.  Psychiatric: She has a normal mood and affect. Her behavior is normal. Judgment and thought content normal.    BP 124/59  Pulse 61  Temp(Src) 97.7 F (36.5 C) (Oral)  Ht 5\' 2"  (1.575 m)  Wt 160 lb (72.576 kg)  BMI 29.26 kg/m2       Assessment & Plan:   1. Annual physical exam   2. Encounter for routine gynecological examination   3. Hypertension   4. Hyperlipidemia LDL goal < 100    Orders Placed This Encounter  Procedures  . CMP14+EGFR  . NMR, lipoprofile  . Thyroid Panel With TSH  . POCT urinalysis dipstick  . POCT CBC   Meds ordered this encounter  Medications  . DISCONTD: clobetasol cream (TEMOVATE) 0.05 %    Sig: Apply 1 application topically 2 (two) times daily.  . clobetasol cream (TEMOVATE) 0.05 %    Sig: Apply 1 application topically 2 (two) times daily.    Dispense:  30 g    Refill:  3    Order Specific Question:  Supervising Provider    Answer:  Ernestina Penna [[id number]]  . omeprazole (PRILOSEC) 20 MG capsule    Sig: Take 1 capsule (20 mg total) by mouth every evening.    Dispense:  30 capsule    Refill:  5    Order Specific Question:  Supervising Provider    Answer:  Ernestina Penna [[id number]]  . gabapentin (NEURONTIN) 600 MG tablet    Sig: Take 1 tablet (600 mg total) by mouth 3 (three) times daily.    Dispense:  90 tablet    Refill:  5    Order Specific Question:  Supervising Provider    Answer:  Ernestina Penna [[id number]]  . atorvastatin (LIPITOR) 40 MG tablet    Sig: Take 1 tablet (40 mg total) by mouth daily at 6 PM.    Dispense:  30 tablet    Refill:  5    Order Specific Question:  Supervising Provider    Answer:  Ernestina Penna [[id number]]  . metoprolol (LOPRESSOR) 50 MG tablet    Sig: Take 1 tablet (50 mg total) by mouth 2 (two) times daily.    Dispense:  180 tablet    Refill:  1    Order Specific Question:  Supervising Provider    Answer:  Deborra Medina   Continue all meds Labs pending Diet and  exercise encouraged Health maintenance reviewed Follow up in 6 months  Mary-Margaret Daphine Deutscher, FNP

## 2013-02-17 NOTE — Patient Instructions (Signed)

## 2013-02-19 LAB — THYROID PANEL WITH TSH
Free Thyroxine Index: 1.8 (ref 1.2–4.9)
T4, Total: 6.8 ug/dL (ref 4.5–12.0)
TSH: 3.17 u[IU]/mL (ref 0.450–4.500)

## 2013-02-19 LAB — CMP14+EGFR
ALT: 14 IU/L (ref 0–32)
AST: 17 IU/L (ref 0–40)
Alkaline Phosphatase: 98 IU/L (ref 39–117)
BUN/Creatinine Ratio: 20 (ref 11–26)
CO2: 28 mmol/L (ref 18–29)
Calcium: 9.8 mg/dL (ref 8.6–10.2)
Potassium: 5 mmol/L (ref 3.5–5.2)
Sodium: 142 mmol/L (ref 134–144)

## 2013-02-19 LAB — NMR, LIPOPROFILE
HDL Cholesterol by NMR: 51 mg/dL (ref >=40)
LDLC SERPL CALC-MCNC: 89 mg/dL (ref <100)
LP-IR Score: 65 — ABNORMAL HIGH (ref <=45)
Small LDL Particle Number: 957 nmol/L — ABNORMAL HIGH (ref <=527)

## 2013-02-22 LAB — PAP IG (IMAGE GUIDED): PAP Smear Comment: 0

## 2013-02-23 ENCOUNTER — Encounter: Payer: Self-pay | Admitting: *Deleted

## 2013-03-17 ENCOUNTER — Ambulatory Visit (INDEPENDENT_AMBULATORY_CARE_PROVIDER_SITE_OTHER): Payer: Medicare Other | Admitting: Cardiovascular Disease

## 2013-03-17 ENCOUNTER — Encounter: Payer: Self-pay | Admitting: Cardiovascular Disease

## 2013-03-17 VITALS — BP 120/64 | HR 66 | Wt 160.0 lb

## 2013-03-17 DIAGNOSIS — E785 Hyperlipidemia, unspecified: Secondary | ICD-10-CM

## 2013-03-17 DIAGNOSIS — I350 Nonrheumatic aortic (valve) stenosis: Secondary | ICD-10-CM

## 2013-03-17 DIAGNOSIS — I10 Essential (primary) hypertension: Secondary | ICD-10-CM

## 2013-03-17 DIAGNOSIS — I359 Nonrheumatic aortic valve disorder, unspecified: Secondary | ICD-10-CM

## 2013-03-17 NOTE — Assessment & Plan Note (Signed)
Well controlled.  Continue current medications and low sodium Dash type diet.    

## 2013-03-17 NOTE — Progress Notes (Signed)
Patient ID: [name], female   DOB: 1935-12-13, 77 y.o.   MRN: [id number] Haydn is seen today in followup for chest pain aortic stenosis and hypercholesterolemia. His been doing well. She has arthritis and fibromyalgia.She had an essentially normal heart cath in 2008 and a normal Myoview 2009. Her EKGs have been normal. Don't think her current chest pains or angina. She has mild aortic stenosis. Her mean aortic valve gradient was 10 with a peak gradient of 14 by echo on 11/10 Her dentition is in good shape. He is not any palpitations PND orthopnea his been no syncope lower extremity edema or fever of unknown origin.   Echo:  10/13   Study Conclusions  - Left ventricle: The cavity size was normal. Wall thickness was increased in a pattern of mild LVH. Systolic function was normal. The estimated ejection fraction was in the range of 60% to 65%. Wall motion was normal; there were no regional wall motion abnormalities. Doppler parameters are consistent with abnormal left ventricular relaxation (grade 1 diastolic dysfunction). - Aortic valve: There was mild stenosis. Trivial regurgitation. - Mitral valve: Mild regurgitation. - Atrial septum: No defect or patent foramen ovale was identified.  Carotids done last year normal with referred murmur to neck        ROS: Denies fever, malais, weight loss, blurry vision, decreased visual acuity, cough, sputum, SOB, hemoptysis, pleuritic pain, palpitaitons, heartburn, abdominal pain, melena, lower extremity edema, claudication, or rash.  All other systems reviewed and negative  General: Affect appropriate Healthy:  appears stated age HEENT: normal Neck supple with no adenopathy JVP normal no bruits no thyromegaly Lungs clear with no wheezing and good diaphragmatic motion Heart:  S1/S2 preserved AS murmur, no rub, gallop or click PMI normal Abdomen: benighn, BS positve, no tenderness, no AAA no bruit.  No HSM or HJR Distal pulses intact with no  bruits No edema Neuro non-focal Skin warm and dry No muscular weakness   Current Outpatient Prescriptions  Medication Sig Dispense Refill  . aspirin 81 MG tablet Take 81 mg by mouth daily.       Marland Kitchen atorvastatin (LIPITOR) 40 MG tablet Take 1 tablet (40 mg total) by mouth daily at 6 PM.  30 tablet  5  . Calcium-Vitamin D 600-125 MG-UNIT TABS Take 1 tablet by mouth at bedtime.       . clobetasol cream (TEMOVATE) 0.05 % Apply 1 application topically 2 (two) times daily.  30 g  3  . Coenzyme Q10 (COQ-10 PO) Take 1 tablet by mouth daily.      . metoprolol (LOPRESSOR) 50 MG tablet Take 1 tablet (50 mg total) by mouth 2 (two) times daily.  180 tablet  1  . omeprazole (PRILOSEC) 20 MG capsule Take 1 capsule (20 mg total) by mouth every evening.  30 capsule  5   No current facility-administered medications for this visit.    Allergies  Latex and Tape  Electrocardiogram:  SR rate 66 normal    Assessment and Plan         ROS: Denies fever, malais, weight loss, blurry vision, decreased visual acuity, cough, sputum, SOB, hemoptysis, pleuritic pain, palpitaitons, heartburn, abdominal pain, melena, lower extremity edema, claudication, or rash.  All other systems reviewed and negative  General: Affect appropriate Healthy:  appears stated age HEENT: normal Neck supple with no adenopathy JVP normal no bruits no thyromegaly Lungs clear with no wheezing and good diaphragmatic motion Heart:  S1/S2 no murmur, no rub, gallop or click  PMI normal Abdomen: benighn, BS positve, no tenderness, no AAA no bruit.  No HSM or HJR Distal pulses intact with no bruits No edema Neuro non-focal Skin warm and dry No muscular weakness   Current Outpatient Prescriptions  Medication Sig Dispense Refill  . aspirin 81 MG tablet Take 81 mg by mouth daily.       Marland Kitchen atorvastatin (LIPITOR) 40 MG tablet Take 1 tablet (40 mg total) by mouth daily at 6 PM.  30 tablet  5  . Calcium-Vitamin D 600-125 MG-UNIT TABS  Take 1 tablet by mouth at bedtime.       . clobetasol cream (TEMOVATE) 0.05 % Apply 1 application topically 2 (two) times daily.  30 g  3  . gabapentin (NEURONTIN) 600 MG tablet Take 1 tablet (600 mg total) by mouth 3 (three) times daily.  90 tablet  5  . metoprolol (LOPRESSOR) 50 MG tablet Take 1 tablet (50 mg total) by mouth 2 (two) times daily.  180 tablet  1  . omeprazole (PRILOSEC) 20 MG capsule Take 1 capsule (20 mg total) by mouth every evening.  30 capsule  5   No current facility-administered medications for this visit.    Allergies  Latex and Tape  Electrocardiogram:  Assessment and Plan

## 2013-03-17 NOTE — Assessment & Plan Note (Signed)
Cholesterol is at goal.  Continue current dose of statin and diet Rx.  No myalgias or side effects.  F/U  LFT's in 6 months. Lab Results  Component Value Date   LDLCALC 107* 09/07/2012

## 2013-03-17 NOTE — Assessment & Plan Note (Signed)
Murmur has always seemed more moderate Murmur refers to neck No carotid disease by duplex  F/U echo for EF and gradients

## 2013-03-17 NOTE — Patient Instructions (Signed)

## 2013-03-25 ENCOUNTER — Encounter (INDEPENDENT_AMBULATORY_CARE_PROVIDER_SITE_OTHER): Payer: Self-pay

## 2013-03-25 ENCOUNTER — Ambulatory Visit (INDEPENDENT_AMBULATORY_CARE_PROVIDER_SITE_OTHER): Payer: Medicare Other | Admitting: Family Medicine

## 2013-03-25 VITALS — BP 140/67 | HR 71 | Temp 97.0°F | Ht 62.0 in | Wt 159.0 lb

## 2013-03-25 DIAGNOSIS — N200 Calculus of kidney: Secondary | ICD-10-CM

## 2013-03-25 DIAGNOSIS — R109 Unspecified abdominal pain: Secondary | ICD-10-CM

## 2013-03-25 LAB — POCT URINALYSIS DIPSTICK
Bilirubin, UA: NEGATIVE
Glucose, UA: NEGATIVE
Ketones, UA: NEGATIVE
Leukocytes, UA: NEGATIVE
Nitrite, UA: NEGATIVE
Protein, UA: NEGATIVE
Spec Grav, UA: 1.015
Urobilinogen, UA: NEGATIVE
pH, UA: 6

## 2013-03-25 MED ORDER — HYDROCODONE-ACETAMINOPHEN 5-325 MG PO TABS: 1.0000 | ORAL_TABLET | Freq: Four times a day (QID) | ORAL | Status: DC | PRN

## 2013-03-25 NOTE — Progress Notes (Signed)
   Subjective:    Patient ID: [name], female    DOB: 11-Oct-1935, 77 y.o.   MRN: [id number]  HPI This 77 y.o. female presents for evaluation of left flank pain that started this am. She has hx of kidney stones in the past.   Review of Systems C/o left flank pain No chest pain, SOB, HA, dizziness, vision change, N/V, diarrhea, constipation, dysuria, urinary urgency or frequency, myalgias, arthralgias or rash.     Objective:   Physical Exam Vital signs noted  Well developed well nourished female.  HEENT - Head atraumatic Normocephalic                Eyes - PERRLA, Conjuctiva - clear Sclera- Clear EOMI                Ears - EAC's Wnl TM's Wnl Gross Hearing WNL                Nose - Nares patent                 Throat - oropharanx wnl Respiratory - Lungs CTA bilateral Cardiac - RRR S1 and S2 without murmur MS - TTP Left CVA  Results for orders placed in visit on 03/25/13  POCT URINALYSIS DIPSTICK      Result Value Range   Color, UA yellow     Clarity, UA clear     Glucose, UA neg     Bilirubin, UA neg     Ketones, UA neg     Spec Grav, UA 1.015     Blood, UA mod     pH, UA 6.0     Protein, UA neg     Urobilinogen, UA negative     Nitrite, UA neg     Leukocytes, UA Negative           Assessment & Plan:  Flank pain - Plan: POCT urinalysis dipstick, Urine culture, HYDROcodone-acetaminophen (NORCO) 5-325 MG per tablet  Kidney stone - Plan: HYDROcodone-acetaminophen (NORCO) 5-325 MG per tablet  Push po fluids, rest, tylenol and motrin otc prn as directed for fever, arthralgias, and myalgias.  Follow up prn if sx's continue or persist.  If pain worsens and unable to void then go to ED.  Deatra Canter FNP

## 2013-03-28 LAB — URINE CULTURE

## 2013-04-20 ENCOUNTER — Encounter: Payer: Self-pay | Admitting: Cardiology

## 2013-04-20 ENCOUNTER — Ambulatory Visit (HOSPITAL_COMMUNITY): Payer: Medicare Other | Attending: Cardiology | Admitting: Radiology

## 2013-04-20 DIAGNOSIS — I359 Nonrheumatic aortic valve disorder, unspecified: Secondary | ICD-10-CM | POA: Insufficient documentation

## 2013-04-20 DIAGNOSIS — I10 Essential (primary) hypertension: Secondary | ICD-10-CM | POA: Insufficient documentation

## 2013-04-20 DIAGNOSIS — E785 Hyperlipidemia, unspecified: Secondary | ICD-10-CM | POA: Insufficient documentation

## 2013-04-20 DIAGNOSIS — I350 Nonrheumatic aortic (valve) stenosis: Secondary | ICD-10-CM

## 2013-04-20 NOTE — Progress Notes (Signed)
Echocardiogram performed.  

## 2013-05-29 ENCOUNTER — Encounter (HOSPITAL_COMMUNITY): Payer: Self-pay | Admitting: *Deleted

## 2013-05-29 ENCOUNTER — Observation Stay (HOSPITAL_COMMUNITY)
Admission: EM | Admit: 2013-05-29 | Discharge: 2013-06-02 | Disposition: A | Discharge status: Home / Self Care | Payer: Medicare Other | Attending: Internal Medicine | Admitting: Internal Medicine

## 2013-05-29 ENCOUNTER — Inpatient Hospital Stay (EMERGENCY_DEPARTMENT_HOSPITAL)
Admission: AD | Admit: 2013-05-29 | Discharge: 2013-05-29 | Disposition: A | Discharge status: Home / Self Care | Payer: Medicare Other | Source: Ambulatory Visit | Attending: Obstetrics & Gynecology | Admitting: Obstetrics & Gynecology

## 2013-05-29 ENCOUNTER — Encounter (HOSPITAL_COMMUNITY): Payer: Self-pay | Admitting: Emergency Medicine

## 2013-05-29 ENCOUNTER — Emergency Department (HOSPITAL_COMMUNITY): Payer: Medicare Other

## 2013-05-29 DIAGNOSIS — K219 Gastro-esophageal reflux disease without esophagitis: Secondary | ICD-10-CM

## 2013-05-29 DIAGNOSIS — R42 Dizziness and giddiness: Secondary | ICD-10-CM | POA: Insufficient documentation

## 2013-05-29 DIAGNOSIS — Z791 Long term (current) use of non-steroidal anti-inflammatories (NSAID): Secondary | ICD-10-CM

## 2013-05-29 DIAGNOSIS — I2510 Atherosclerotic heart disease of native coronary artery without angina pectoris: Secondary | ICD-10-CM | POA: Insufficient documentation

## 2013-05-29 DIAGNOSIS — Z79899 Other long term (current) drug therapy: Secondary | ICD-10-CM | POA: Insufficient documentation

## 2013-05-29 DIAGNOSIS — D649 Anemia, unspecified: Secondary | ICD-10-CM

## 2013-05-29 DIAGNOSIS — K5790 Diverticulosis of intestine, part unspecified, without perforation or abscess without bleeding: Secondary | ICD-10-CM

## 2013-05-29 DIAGNOSIS — G4733 Obstructive sleep apnea (adult) (pediatric): Secondary | ICD-10-CM | POA: Insufficient documentation

## 2013-05-29 DIAGNOSIS — K5730 Diverticulosis of large intestine without perforation or abscess without bleeding: Secondary | ICD-10-CM

## 2013-05-29 DIAGNOSIS — R55 Syncope and collapse: Secondary | ICD-10-CM | POA: Diagnosis present

## 2013-05-29 DIAGNOSIS — I350 Nonrheumatic aortic (valve) stenosis: Secondary | ICD-10-CM

## 2013-05-29 DIAGNOSIS — K625 Hemorrhage of anus and rectum: Secondary | ICD-10-CM

## 2013-05-29 DIAGNOSIS — IMO0001 Reserved for inherently not codable concepts without codable children: Secondary | ICD-10-CM | POA: Diagnosis present

## 2013-05-29 DIAGNOSIS — E785 Hyperlipidemia, unspecified: Secondary | ICD-10-CM | POA: Insufficient documentation

## 2013-05-29 DIAGNOSIS — I359 Nonrheumatic aortic valve disorder, unspecified: Secondary | ICD-10-CM | POA: Insufficient documentation

## 2013-05-29 DIAGNOSIS — I10 Essential (primary) hypertension: Secondary | ICD-10-CM

## 2013-05-29 DIAGNOSIS — R000 Tachycardia, unspecified: Secondary | ICD-10-CM | POA: Insufficient documentation

## 2013-05-29 DIAGNOSIS — K648 Other hemorrhoids: Secondary | ICD-10-CM

## 2013-05-29 DIAGNOSIS — K644 Residual hemorrhoidal skin tags: Secondary | ICD-10-CM

## 2013-05-29 DIAGNOSIS — K922 Gastrointestinal hemorrhage, unspecified: Secondary | ICD-10-CM

## 2013-05-29 DIAGNOSIS — R1031 Right lower quadrant pain: Secondary | ICD-10-CM | POA: Insufficient documentation

## 2013-05-29 DIAGNOSIS — Z7982 Long term (current) use of aspirin: Secondary | ICD-10-CM | POA: Insufficient documentation

## 2013-05-29 LAB — URINALYSIS, ROUTINE W REFLEX MICROSCOPIC
Bilirubin Urine: NEGATIVE
GLUCOSE, UA: NEGATIVE mg/dL
Ketones, ur: NEGATIVE mg/dL
Leukocytes, UA: NEGATIVE
Nitrite: NEGATIVE
Protein, ur: NEGATIVE mg/dL
Specific Gravity, Urine: 1.005 (ref 1.005–1.030)
Urobilinogen, UA: 1 mg/dL (ref 0.0–1.0)
pH: 6.5 (ref 5.0–8.0)

## 2013-05-29 LAB — CBC WITH DIFFERENTIAL/PLATELET
BASOS PCT: 0 % (ref 0–1)
Basophils Absolute: 0 10*3/uL (ref 0.0–0.1)
EOS PCT: 0 % (ref 0–5)
Eosinophils Absolute: 0 10*3/uL (ref 0.0–0.7)
HEMATOCRIT: 35.8 % — AB (ref 36.0–46.0)
HEMOGLOBIN: 11.6 g/dL — AB (ref 12.0–15.0)
Lymphocytes Relative: 22 % (ref 12–46)
Lymphs Abs: 1.8 10*3/uL (ref 0.7–4.0)
MCH: 28.7 pg (ref 26.0–34.0)
MCHC: 32.4 g/dL (ref 30.0–36.0)
MCV: 88.6 fL (ref 78.0–100.0)
MONO ABS: 0.6 10*3/uL (ref 0.1–1.0)
MONOS PCT: 8 % (ref 3–12)
NEUTROS ABS: 5.9 10*3/uL (ref 1.7–7.7)
Neutrophils Relative %: 70 % (ref 43–77)
Platelets: 237 10*3/uL (ref 150–400)
RBC: 4.04 MIL/uL (ref 3.87–5.11)
RDW: 13.2 % (ref 11.5–15.5)
WBC: 8.4 10*3/uL (ref 4.0–10.5)

## 2013-05-29 LAB — COMPREHENSIVE METABOLIC PANEL
ALBUMIN: 3.6 g/dL (ref 3.5–5.2)
ALT: 16 U/L (ref 0–35)
AST: 19 U/L (ref 0–37)
Alkaline Phosphatase: 75 U/L (ref 39–117)
BILIRUBIN TOTAL: 0.5 mg/dL (ref 0.3–1.2)
BUN: 13 mg/dL (ref 6–23)
CALCIUM: 9.5 mg/dL (ref 8.4–10.5)
CO2: 25 mEq/L (ref 19–32)
CREATININE: 0.66 mg/dL (ref 0.50–1.10)
Chloride: 102 mEq/L (ref 96–112)
GFR calc Af Amer: >90 mL/min (ref >90)
GFR calc non Af Amer: 84 mL/min — ABNORMAL LOW (ref >90)
Glucose, Bld: 94 mg/dL (ref 70–99)
Potassium: 4.5 mEq/L (ref 3.7–5.3)
Sodium: 139 mEq/L (ref 137–147)
Total Protein: 6.9 g/dL (ref 6.0–8.3)

## 2013-05-29 LAB — TROPONIN I: Troponin I: <0.3 ng/mL (ref <0.30)

## 2013-05-29 LAB — GLUCOSE, CAPILLARY: Glucose-Capillary: 94 mg/dL (ref 70–99)

## 2013-05-29 LAB — URINE MICROSCOPIC-ADD ON

## 2013-05-29 LAB — CBC
HEMATOCRIT: 37.2 % (ref 36.0–46.0)
HEMOGLOBIN: 12.2 g/dL (ref 12.0–15.0)
MCH: 28.7 pg (ref 26.0–34.0)
MCHC: 32.8 g/dL (ref 30.0–36.0)
MCV: 87.5 fL (ref 78.0–100.0)
Platelets: 240 10*3/uL (ref 150–400)
RBC: 4.25 MIL/uL (ref 3.87–5.11)
RDW: 13.4 % (ref 11.5–15.5)
WBC: 10.7 10*3/uL — ABNORMAL HIGH (ref 4.0–10.5)

## 2013-05-29 LAB — PROTIME-INR
INR: 1.01 (ref 0.00–1.49)
Prothrombin Time: 13.1 seconds (ref 11.6–15.2)

## 2013-05-29 LAB — OCCULT BLOOD, POC DEVICE: FECAL OCCULT BLD: POSITIVE — AB

## 2013-05-29 MED ORDER — SODIUM CHLORIDE 0.9 % IV BOLUS (SEPSIS)
1000.0000 mL | Freq: Once | INTRAVENOUS | Status: AC
  Administered 2013-05-29: 1000 mL via INTRAVENOUS

## 2013-05-29 MED ORDER — IOHEXOL 300 MG/ML  SOLN
100.0000 mL | Freq: Once | INTRAMUSCULAR | Status: AC | PRN
  Administered 2013-05-29: 100 mL via INTRAVENOUS

## 2013-05-29 MED ORDER — IOHEXOL 300 MG/ML  SOLN
25.0000 mL | Freq: Once | INTRAMUSCULAR | Status: AC | PRN
  Administered 2013-05-29: 25 mL via ORAL

## 2013-05-29 NOTE — H&P (Signed)
[facility] History and Physical  [name] [id number] DOB: 1935/05/27 DOA: 05/29/2013  Referring physician: ED PCP: [name], MD   Chief Complaint:  Rectal bleed since 2 days lower quadrant pain and near-syncope since one day  HPI:  78 year old female with history of hypertension, hyperlipidemia, moderate aortic stenosis, GERD, history of diverticulosis and internal hemorrhoids (colonoscopy in 2011 by [name]) presented to the ED with rectal bleed since 2 days. Patient noticed dark stool occasionally mixed with bright blood when cleaning herself after bowel movement. She reports having 3 bowel movements on the first day ,1 BM yesterday and about 4-5 BM today.  She reports that with every bowel movement she has had dark stools occasionally mixed with bright blood. She denies any pain associated with bowel movements. This morning she started having some cramps over her right lower quadrant and also felt dizzy and diaphoretic take with near syncopal symptoms. She did have some blurred vision as well. She reports that she had not eaten anything since this morning. She also informs taking ibuprofen 400 mg about 2-3 tablets every day for her fibromyalgia symptoms. She has not taken any and states on her aspirin since the last 2 days. She denies any epigastric pain or hematemesis. As noted above she had a colonoscopy done in 2011 which showed diverticulosis and internal hemorrhoids.  Patient denies headache,  fever, chills, nausea , vomiting, chest pain, palpitations, SOB, or urinary symptoms. Denies change in weight or appetite.  Course in the ED Patient was mildly tachycardic to 103, blood pressure elevated to 154/112 mmHg. She was afebrile. Blood work done showed hemoglobin of 11.6 and hematocrit of 35.8. She had initially presented to [facility] earlier this afternoon when a blood will done showed hemoglobin of 12.2. On reviewing her hemoglobin over the last few months her  baseline is around 13-14. Chemistry was unremarkable. 2 sets of troponin were negative. Stool for occult blood was positive. A CT scan of the abdomen and pelvis was done in the ED which was negative for any signs of infection or inflammation. There were scattered diverticula over the left colon and  multiple over sigmoid colon with no signs of acute diverticulitis. [facility] consulted for admission to telemetry.  Review of Systems:  Constitutional: Denies fever, chills, diaphoresis, appetite change and fatigue.  HEENT: Denies photophobia, eye pain,hearing loss, ear pain, congestion, sore throat, rhinorrhea, sneezing, mouth sores, trouble swallowing, neck pain, neck stiffness and tinnitus.   Respiratory: Denies SOB, DOE, cough, chest tightness,  and wheezing.   Cardiovascular: Denies chest pain, palpitations and leg swelling.  Gastrointestinal: Abdominal pain, blood in stool, Denies nausea, vomiting,  diarrhea, constipation,and abdominal distention.  Genitourinary: Denies dysuria, urgency, frequency, hematuria, flank pain and difficulty urinating.  Musculoskeletal: Chronic fibromyalgia, denies joint swelling, arthralgias and gait problem.  Skin: Denies pallor, rash and wound.  Neurological:  dizziness, near syncope, denies seizures, syncope, weakness,  numbness and headaches.  Psychiatric/Behavioral: Denies  Confusion and agitation   Past Medical History  Diagnosis Date  . Hypertension   . GERD (gastroesophageal reflux disease)   . Hyperlipidemia   . Diverticular disease   . Chronic headaches   . Bruises easily   . History of kidney stones   . History of blurred vision   . CAD (coronary artery disease) CARDIOLOGIST- DR [facility] [place]-- LAST VISIT IN [facility]  . Aortic valve stenosis, mild   . Heart murmur   . Spinal stenosis, multilevel   . Spondylosis, cervical   .  Fibromyalgia   . Labral tear of long head of biceps tendon RIGHT SHOULDER  . Right rotator cuff tear   . OSA  (obstructive sleep apnea) CPAP INTOLERANT   . H/O hypoglycemia   . Eczema   . Complication of anesthesia     bad reaction to ether   Past Surgical History  Procedure Laterality Date  . Umbilical hernia repair  JUNE 2006  . Vaginal hysterectomy  01-31-2001    ANTERIOR & POSTERIOR REPAIR/ TRANSVAGINAL BLADDER SLING  . Anterior cervical decomp/discectomy fusion  11-11-1999    C5 - C6  . Thyroidectomy  AGE 50    GOITER  . Hemorrhoidectomy with hemorrhoid banding  08-07-2010  . Laparoscopic cholecystectomy  1990  . Appendectomy  AGE 83  . Tonsillectomy and adenoidectomy  AGE 30  . Cataract extraction w/ intraocular lens  implant, bilateral    . Transthoracic echocardiogram  01-20-2012    MILD LVH/ NORMAL LVSF/ EF 60-65%/ MILD AORTIC VALVE STENOSIS/ MILD MV REGURG.  . Cardiac catheterization  2008  [facility]    ESSENTIALLY NORMAL  . Shoulder arthroscopy with open rotator cuff repair and distal clavicle acrominectomy Right 05/25/2012    Procedure: SHOULDER ARTHROSCOPY WITH OPEN ROTATOR CUFF REPAIR AND DISTAL CLAVICLE ACROMINECTOMY;  Surgeon: Magnus Sinning, MD;  Location: [facility];  Service: Orthopedics;  Laterality: Right;  RIGHT SHOULDER ARTHROSCOPY WITH DERBRIDEMENTOF LABRAL/BICEP TENDON, OPEN DISTAL CLAVICLE RESECTION, ANTERIOR ACROMINECTOMY ROTATOR CUFF REPAIR ANESTHESIA: GENERAL/SCALENE NERVE BLOCK   Social History:  reports that she has never smoked. She has never used smokeless tobacco. She reports that she drinks alcohol. She reports that she does not use illicit drugs.  Allergies  Allergen Reactions  . Latex Dermatitis and Rash    "with  extended exposure"  . Tape Other (See Comments)    Dermatitis rash "with extended exposure"    History reviewed. No pertinent family history.  Prior to Admission medications   Medication Sig Start Date End Date Taking? Authorizing Provider  aspirin 81 MG tablet Take 81 mg by mouth daily.    Yes Historical Provider, MD   atorvastatin (LIPITOR) 40 MG tablet Take 1 tablet (40 mg total) by mouth daily at 6 PM. 02/17/13  Yes Mary-Margaret Hassell Done, FNP  Calcium-Vitamin D 600-125 MG-UNIT TABS Take 1 tablet by mouth at bedtime.    Yes Historical Provider, MD  clobetasol cream (TEMOVATE) 0.86 % Apply 1 application topically 2 (two) times daily as needed (for rash on pelvic area).   Yes Historical Provider, MD  Coenzyme Q10 (COQ-10 PO) Take 1 tablet by mouth daily.   Yes Historical Provider, MD  cycloSPORINE (RESTASIS) 0.05 % ophthalmic emulsion Place 1 drop into both eyes 2 (two) times daily.   Yes Historical Provider, MD  gabapentin (NEURONTIN) 600 MG tablet Take 1 tablet by mouth 3 (three) times daily. 02/17/13  Yes Historical Provider, MD  hydroxypropyl methylcellulose (ISOPTO TEARS) 2.5 % ophthalmic solution Place 2 drops into both eyes 3 (three) times daily as needed for dry eyes.   Yes Historical Provider, MD  metoprolol (LOPRESSOR) 50 MG tablet Take 1 tablet (50 mg total) by mouth 2 (two) times daily. 02/17/13  Yes Mary-Margaret Hassell Done, FNP  omeprazole (PRILOSEC) 20 MG capsule Take 1 capsule (20 mg total) by mouth every evening. 02/17/13  Yes Mary-Margaret Hassell Done, FNP     Physical Exam:  Filed Vitals:   05/29/13 1602 05/29/13 1745 05/29/13 1830 05/29/13 1855  BP: 127/62   154/112  Pulse: 70  73  Temp: 98.8 F (37.1 C)   98.1 F (36.7 C)  TempSrc: Oral   Oral  Resp: 18 16 16 14   Weight: 73.483 kg (162 lb)     SpO2: 95%   97%    Constitutional: Vital signs reviewed.  Patient is a well-developed and well-nourished elderly female in no acute distress. HEENT: no pallor, no icterus, moist oral mucosa, no cervical lymphadenopathy Cardiovascular: RRR, S1 normal, S2 normal, 3/6 systolic murmur  Chest: CTAB, no wheezes, rales, or rhonchi Abdominal: Soft. non-distended, bowel sounds are normal, tenderness to deep palpation over bilateral lower quadrants Ext: warm, no edema Neurological: A&O x3, non focal  Labs  on Admission:  Basic Metabolic Panel:  Recent Labs Lab 05/29/13 1645  NA 139  K 4.5  CL 102  CO2 25  GLUCOSE 94  BUN 13  CREATININE 0.66  CALCIUM 9.5   Liver Function Tests:  Recent Labs Lab 05/29/13 1645  AST 19  ALT 16  ALKPHOS 75  BILITOT 0.5  PROT 6.9  ALBUMIN 3.6   No results found for this basename: LIPASE, AMYLASE,  in the last 168 hours No results found for this basename: AMMONIA,  in the last 168 hours CBC:  Recent Labs Lab 05/29/13 1350 05/29/13 1645  WBC 10.7* 8.4  NEUTROABS  --  5.9  HGB 12.2 11.6*  HCT 37.2 35.8*  MCV 87.5 88.6  PLT 240 237   Cardiac Enzymes:  Recent Labs Lab 05/29/13 1645 05/29/13 2015  TROPONINI <0.30 <0.30   BNP: No components found with this basename: POCBNP,  CBG:  Recent Labs Lab 05/29/13 1433  GLUCAP 94    Radiological Exams on Admission: Ct Abdomen Pelvis W Contrast  05/29/2013   CLINICAL DATA:  Dizziness, abdominal pain, rectal bleeding  EXAM: CT ABDOMEN AND PELVIS WITH CONTRAST  TECHNIQUE: Multidetector CT imaging of the abdomen and pelvis was performed using the standard protocol following bolus administration of intravenous contrast.  CONTRAST:  2mL OMNIPAQUE IOHEXOL 300 MG/ML SOLN, 150mL OMNIPAQUE IOHEXOL 300 MG/ML SOLN  COMPARISON:  02/23/2008  FINDINGS: Lung bases are unremarkable. Sagittal images of the spine shows osteopenia and degenerative changes lumbar spine. Liver, pancreas, spleen and adrenals are unremarkable. Kidneys are symmetrical in size and enhancement. No hydronephrosis or hydroureter. A cyst in left kidney measures 1 cm stable from prior exam.  Extensive atherosclerotic calcifications of abdominal aorta and iliac arteries. No aortic aneurysm.  Delayed renal images shows bilateral renal symmetrical excretion. Bilateral visualized proximal ureter is unremarkable.  There is no small bowel obstruction. No ascites or free air. No adenopathy. There is no pericecal inflammation. Multiple sigmoid colon  diverticula are noted. No evidence of acute diverticulitis. Few diverticula are noted left colon. No evidence of acute diverticulitis. The urinary bladder is unremarkable. The patient is status post hysterectomy. No destructive bony lesions are noted within pelvis.  IMPRESSION: 1. No acute inflammatory process within abdomen or pelvis. Scattered diverticula are noted left colon. Multiple sigmoid colon diverticula. No evidence of acute diverticulitis. 2. Status post hysterectomy. 3. Atherosclerotic calcifications of abdominal aorta and iliac arteries. 4. No pericecal inflammation.  No small bowel obstruction.   Electronically Signed   By: Lahoma Crocker M.D.   On: 05/29/2013 19:59    EKG: NSR no ST- changes  Assessment/Plan  Principal Problem:   Rectal bleeding Possibly diverticular bleed vs internal hemorrhoids. however given nature of bleed including dark stools and being on NSAIDs cannot r/o UGI bleed. Admitted to telemetry. -Patient has syncopal  symptoms and some drop in her H&H likely secondary to this. H&H q6 hours.  -IV PPI twice a day. Avoid NSAIDs. Will hold aspirin. -Type and screen. Does not need transfusion at this time. -Patient seen by [name] previously. ED was consulting  [facility] -We'll keep her n.p.o. after midnight  Active Problems: Anemia Patient has about 2 g drop in her hemoglobin from a baseline. Will get type and screen. transfuse as needed.   Near syncope Likely secondary to blood loss anemia. Take orthostatic vitals. No EKG changes. Monitor on telemetry. 2 sets of troponin negative.  Hypertension Resume home medications  GERD IV PPI    Aortic stenosis, moderate Follows with [name]         Diet:NPO after midnight  DVT prophylaxis: SCD   Code Status: full code Family Communication: discussed with daughter at bedside Disposition Plan: home once stable  Kelda Azad, [facility] Pager [phone]  Total time spent on admission :70  minutes  If 7PM-7AM, please contact night-coverage [email or web address] Password [facility] 05/29/2013, 9:09 PM

## 2013-05-29 NOTE — ED Provider Notes (Signed)
CSN: [id number]     Arrival date & time 05/29/13  1542 History   First MD Initiated Contact with Patient 05/29/13 1628     Chief Complaint  Patient presents with  . Rectal Bleeding     (Consider location/radiation/quality/duration/timing/severity/associated sxs/prior Treatment) The history is provided by the patient. No language interpreter was used.  [name] is a 78 year old female with past medical history of hypertension, GERD, hyperlipidemia, diverticular disease, chronic headache, kidney stones, coronary artery disease, aortic valve stenosis, fibromyalgia, hypoglycemia presenting to emergency department with black tarry stools with intermittent blood in her stools that is been ongoing since Monday morning. Stated that with each bowel movement the patient has had she's noticed that the stools are dark and bloody. Reported that she started to experience right lower quadrant pain starting Saturday that is described as needles without radiation. Reported that she's been feeling intermittent nausea without episodes of vomiting. Stated that she's been having decreased appetite. Daughter reported that patient was sweaty and diaphoretic today. Patient reported that she started to experience blurry vision and felt as if she was going to pass out-denied syncope. Reported that she has not been taking anything for the pain. Reported that she stopped taking aspirin on Saturday, but reported that she's been taking ibuprofen. Denied chest pain, shortness of breath, difficulty breathing, headache, urinary symptoms, numbness, tingling, syncope, never experiencing this before. Patient was sent from women's PCP Dr. Laurance Flatten  Past Medical History  Diagnosis Date  . Hypertension   . GERD (gastroesophageal reflux disease)   . Hyperlipidemia   . Diverticular disease   . Chronic headaches   . Bruises easily   . History of kidney stones   . History of blurred vision   . CAD (coronary artery disease)  CARDIOLOGIST- DR Jenkins [place]-- LAST VISIT IN [facility]  . Aortic valve stenosis, mild   . Heart murmur   . Spinal stenosis, multilevel   . Spondylosis, cervical   . Fibromyalgia   . Labral tear of long head of biceps tendon RIGHT SHOULDER  . Right rotator cuff tear   . OSA (obstructive sleep apnea) CPAP INTOLERANT   . H/O hypoglycemia   . Eczema   . Complication of anesthesia     bad reaction to ether   Past Surgical History  Procedure Laterality Date  . Umbilical hernia repair  JUNE 2006  . Vaginal hysterectomy  01-31-2001    ANTERIOR & POSTERIOR REPAIR/ TRANSVAGINAL BLADDER SLING  . Anterior cervical decomp/discectomy fusion  11-11-1999    C5 - C6  . Thyroidectomy  AGE 49    GOITER  . Hemorrhoidectomy with hemorrhoid banding  08-07-2010  . Laparoscopic cholecystectomy  1990  . Appendectomy  AGE 78  . Tonsillectomy and adenoidectomy  AGE 89  . Cataract extraction w/ intraocular lens  implant, bilateral    . Transthoracic echocardiogram  01-20-2012    MILD LVH/ NORMAL LVSF/ EF 60-65%/ MILD AORTIC VALVE STENOSIS/ MILD MV REGURG.  . Cardiac catheterization  2008  [facility]    ESSENTIALLY NORMAL  . Shoulder arthroscopy with open rotator cuff repair and distal clavicle acrominectomy Right 05/25/2012    Procedure: SHOULDER ARTHROSCOPY WITH OPEN ROTATOR CUFF REPAIR AND DISTAL CLAVICLE ACROMINECTOMY;  Surgeon: Magnus Sinning, MD;  Location: [facility];  Service: Orthopedics;  Laterality: Right;  RIGHT SHOULDER ARTHROSCOPY WITH DERBRIDEMENTOF LABRAL/BICEP TENDON, OPEN DISTAL CLAVICLE RESECTION, ANTERIOR ACROMINECTOMY ROTATOR CUFF REPAIR ANESTHESIA: GENERAL/SCALENE NERVE BLOCK   History reviewed. No pertinent family history. History  Substance Use Topics  . Smoking status: Never Smoker   . Smokeless tobacco: Never Used  . Alcohol Use: Yes     Comment: once in a while   OB History   Grav Para Term Preterm Abortions TAB SAB Ect Mult Living   4 3 3  1  1   3       Review of Systems  Constitutional: Negative for fever and chills.  Eyes: Positive for visual disturbance.  Respiratory: Negative for chest tightness and shortness of breath.   Cardiovascular: Negative for chest pain.  Gastrointestinal: Positive for abdominal pain and blood in stool. Negative for nausea, vomiting, diarrhea and constipation.  Musculoskeletal: Negative for back pain and neck pain.  Neurological: Positive for dizziness and weakness.  All other systems reviewed and are negative.      Allergies  Latex and Tape  Home Medications   Current Outpatient Rx  Name  Route  Sig  Dispense  Refill  . aspirin 81 MG tablet   Oral   Take 81 mg by mouth daily.          Marland Kitchen atorvastatin (LIPITOR) 40 MG tablet   Oral   Take 1 tablet (40 mg total) by mouth daily at 6 PM.   30 tablet   5   . Calcium-Vitamin D 600-125 MG-UNIT TABS   Oral   Take 1 tablet by mouth at bedtime.          . clobetasol cream (TEMOVATE) 0.05 %   Topical   Apply 1 application topically 2 (two) times daily as needed (for rash on pelvic area).         . Coenzyme Q10 (COQ-10 PO)   Oral   Take 1 tablet by mouth daily.         . cycloSPORINE (RESTASIS) 0.05 % ophthalmic emulsion   Both Eyes   Place 1 drop into both eyes 2 (two) times daily.         Marland Kitchen gabapentin (NEURONTIN) 600 MG tablet   Oral   Take 1 tablet by mouth 3 (three) times daily.         . hydroxypropyl methylcellulose (ISOPTO TEARS) 2.5 % ophthalmic solution   Both Eyes   Place 2 drops into both eyes 3 (three) times daily as needed for dry eyes.         . metoprolol (LOPRESSOR) 50 MG tablet   Oral   Take 1 tablet (50 mg total) by mouth 2 (two) times daily.   180 tablet   1   . omeprazole (PRILOSEC) 20 MG capsule   Oral   Take 1 capsule (20 mg total) by mouth every evening.   30 capsule   5    BP 154/112  Pulse 73  Temp(Src) 98.1 F (36.7 C) (Oral)  Resp 14  Wt 162 lb (73.483 kg)  SpO2 97% Physical Exam   Nursing note and vitals reviewed. Constitutional: She is oriented to person, place, and time. She appears well-developed and well-nourished. No distress.  HENT:  Head: Normocephalic and atraumatic.  Mouth/Throat: Oropharynx is clear and moist. No oropharyngeal exudate.  Eyes: Conjunctivae and EOM are normal. Pupils are equal, round, and reactive to light. Right eye exhibits no discharge. Left eye exhibits no discharge.  Neck: Normal range of motion. Neck supple. No tracheal deviation present.  Cardiovascular: Normal rate and regular rhythm.  Exam reveals no friction rub.   Murmur heard. Pulses:      Radial pulses are 2+ on the  right side, and 2+ on the left side.       Dorsalis pedis pulses are 2+ on the right side, and 2+ on the left side.  Pulmonary/Chest: Effort normal and breath sounds normal. No respiratory distress. She has no wheezes. She has no rales.  Abdominal: Soft. Bowel sounds are normal. There is tenderness. There is no guarding.  Generalized tenderness to the abdomen upon palpation Positive Murphy's sign Positive McBurney's point  Genitourinary:  Rectal exam: Negative swelling, erythema, hemorrhoids, lesions, fissures noted to the anus. Negative masses, internal hemorrhoids, polyps, fissures noted to the rectum. Black stool with mixed red noted on glove. Exam chaperoned  Musculoskeletal: Normal range of motion.  Full ROM to upper and lower extremities without difficulty noted, negative ataxia noted.  Lymphadenopathy:    She has no cervical adenopathy.  Neurological: She is alert and oriented to person, place, and time. She exhibits normal muscle tone. Coordination normal.  Strength 5+/5+ to upper and lower extremities bilaterally with resistance applied, equal distribution noted  Skin: Skin is warm. No rash noted. She is not diaphoretic. No erythema. There is pallor.  Psychiatric: She has a normal mood and affect. Her behavior is normal. Thought content normal.    ED  Course  Procedures (including critical care time)   8:34 PM This provider spoke with Dr. Clementeen Graham, [facility] - discussed case, history, presentation, imaging, labs in great detail. Patient to be admitted to Telemetry for rectal bleed. Recommended GI consult.   9:11 PM This provider spoke with Dr. Deatra Ina, GI, discussed case, history, presentation - plan for consult while patient is in the hospital. GI to consult. Last endoscopy performed in 2011.   Results for orders placed during the hospital encounter of 05/29/13  CBC WITH DIFFERENTIAL      Result Value Ref Range   WBC 8.4  4.0 - 10.5 K/uL   RBC 4.04  3.87 - 5.11 MIL/uL   Hemoglobin 11.6 (*) 12.0 - 15.0 g/dL   HCT 35.8 (*) 36.0 - 46.0 %   MCV 88.6  78.0 - 100.0 fL   MCH 28.7  26.0 - 34.0 pg   MCHC 32.4  30.0 - 36.0 g/dL   RDW 13.2  11.5 - 15.5 %   Platelets 237  150 - 400 K/uL   Neutrophils Relative % 70  43 - 77 %   Neutro Abs 5.9  1.7 - 7.7 K/uL   Lymphocytes Relative 22  12 - 46 %   Lymphs Abs 1.8  0.7 - 4.0 K/uL   Monocytes Relative 8  3 - 12 %   Monocytes Absolute 0.6  0.1 - 1.0 K/uL   Eosinophils Relative 0  0 - 5 %   Eosinophils Absolute 0.0  0.0 - 0.7 K/uL   Basophils Relative 0  0 - 1 %   Basophils Absolute 0.0  0.0 - 0.1 K/uL  COMPREHENSIVE METABOLIC PANEL      Result Value Ref Range   Sodium 139  137 - 147 mEq/L   Potassium 4.5  3.7 - 5.3 mEq/L   Chloride 102  96 - 112 mEq/L   CO2 25  19 - 32 mEq/L   Glucose, Bld 94  70 - 99 mg/dL   BUN 13  6 - 23 mg/dL   Creatinine, Ser 0.66  0.50 - 1.10 mg/dL   Calcium 9.5  8.4 - 10.5 mg/dL   Total Protein 6.9  6.0 - 8.3 g/dL   Albumin 3.6  3.5 - 5.2 g/dL  AST 19  0 - 37 U/L   ALT 16  0 - 35 U/L   Alkaline Phosphatase 75  39 - 117 U/L   Total Bilirubin 0.5  0.3 - 1.2 mg/dL   GFR calc non Af Amer 84 (*) >90 mL/min   GFR calc Af Amer >90  >90 mL/min  PROTIME-INR      Result Value Ref Range   Prothrombin Time 13.1  11.6 - 15.2 seconds   INR 1.01  0.00 - 1.49   TROPONIN I      Result Value Ref Range   Troponin I <0.30  <0.30 ng/mL  URINALYSIS, ROUTINE W REFLEX MICROSCOPIC      Result Value Ref Range   Color, Urine YELLOW  YELLOW   APPearance CLEAR  CLEAR   Specific Gravity, Urine 1.005  1.005 - 1.030   pH 6.5  5.0 - 8.0   Glucose, UA NEGATIVE  NEGATIVE mg/dL   Hgb urine dipstick TRACE (*) NEGATIVE   Bilirubin Urine NEGATIVE  NEGATIVE   Ketones, ur NEGATIVE  NEGATIVE mg/dL   Protein, ur NEGATIVE  NEGATIVE mg/dL   Urobilinogen, UA 1.0  0.0 - 1.0 mg/dL   Nitrite NEGATIVE  NEGATIVE   Leukocytes, UA NEGATIVE  NEGATIVE  URINE MICROSCOPIC-ADD ON      Result Value Ref Range   Squamous Epithelial / LPF RARE  RARE   WBC, UA 0-2  <3 WBC/hpf   RBC / HPF 3-6  <3 RBC/hpf   Bacteria, UA RARE  RARE  OCCULT BLOOD, POC DEVICE      Result Value Ref Range   Fecal Occult Bld POSITIVE (*) NEGATIVE   Ct Abdomen Pelvis W Contrast  05/29/2013   CLINICAL DATA:  Dizziness, abdominal pain, rectal bleeding  EXAM: CT ABDOMEN AND PELVIS WITH CONTRAST  TECHNIQUE: Multidetector CT imaging of the abdomen and pelvis was performed using the standard protocol following bolus administration of intravenous contrast.  CONTRAST:  22mL OMNIPAQUE IOHEXOL 300 MG/ML SOLN, 166mL OMNIPAQUE IOHEXOL 300 MG/ML SOLN  COMPARISON:  02/23/2008  FINDINGS: Lung bases are unremarkable. Sagittal images of the spine shows osteopenia and degenerative changes lumbar spine. Liver, pancreas, spleen and adrenals are unremarkable. Kidneys are symmetrical in size and enhancement. No hydronephrosis or hydroureter. A cyst in left kidney measures 1 cm stable from prior exam.  Extensive atherosclerotic calcifications of abdominal aorta and iliac arteries. No aortic aneurysm.  Delayed renal images shows bilateral renal symmetrical excretion. Bilateral visualized proximal ureter is unremarkable.  There is no small bowel obstruction. No ascites or free air. No adenopathy. There is no pericecal inflammation.  Multiple sigmoid colon diverticula are noted. No evidence of acute diverticulitis. Few diverticula are noted left colon. No evidence of acute diverticulitis. The urinary bladder is unremarkable. The patient is status post hysterectomy. No destructive bony lesions are noted within pelvis.  IMPRESSION: 1. No acute inflammatory process within abdomen or pelvis. Scattered diverticula are noted left colon. Multiple sigmoid colon diverticula. No evidence of acute diverticulitis. 2. Status post hysterectomy. 3. Atherosclerotic calcifications of abdominal aorta and iliac arteries. 4. No pericecal inflammation.  No small bowel obstruction.   Electronically Signed   By: Lahoma Crocker M.D.   On: 05/29/2013 19:59   Labs Review Labs Reviewed  CBC WITH DIFFERENTIAL - Abnormal; Notable for the following:    Hemoglobin 11.6 (*)    HCT 35.8 (*)    All other components within normal limits  COMPREHENSIVE METABOLIC PANEL - Abnormal; Notable for the following:  GFR calc non Af Amer 84 (*)    All other components within normal limits  URINALYSIS, ROUTINE W REFLEX MICROSCOPIC - Abnormal; Notable for the following:    Hgb urine dipstick TRACE (*)    All other components within normal limits  OCCULT BLOOD, POC DEVICE - Abnormal; Notable for the following:    Fecal Occult Bld POSITIVE (*)    All other components within normal limits  PROTIME-INR  TROPONIN I  URINE MICROSCOPIC-ADD ON  TROPONIN I   Imaging Review Ct Abdomen Pelvis W Contrast  05/29/2013   CLINICAL DATA:  Dizziness, abdominal pain, rectal bleeding  EXAM: CT ABDOMEN AND PELVIS WITH CONTRAST  TECHNIQUE: Multidetector CT imaging of the abdomen and pelvis was performed using the standard protocol following bolus administration of intravenous contrast.  CONTRAST:  46mL OMNIPAQUE IOHEXOL 300 MG/ML SOLN, 143mL OMNIPAQUE IOHEXOL 300 MG/ML SOLN  COMPARISON:  02/23/2008  FINDINGS: Lung bases are unremarkable. Sagittal images of the spine shows osteopenia and  degenerative changes lumbar spine. Liver, pancreas, spleen and adrenals are unremarkable. Kidneys are symmetrical in size and enhancement. No hydronephrosis or hydroureter. A cyst in left kidney measures 1 cm stable from prior exam.  Extensive atherosclerotic calcifications of abdominal aorta and iliac arteries. No aortic aneurysm.  Delayed renal images shows bilateral renal symmetrical excretion. Bilateral visualized proximal ureter is unremarkable.  There is no small bowel obstruction. No ascites or free air. No adenopathy. There is no pericecal inflammation. Multiple sigmoid colon diverticula are noted. No evidence of acute diverticulitis. Few diverticula are noted left colon. No evidence of acute diverticulitis. The urinary bladder is unremarkable. The patient is status post hysterectomy. No destructive bony lesions are noted within pelvis.  IMPRESSION: 1. No acute inflammatory process within abdomen or pelvis. Scattered diverticula are noted left colon. Multiple sigmoid colon diverticula. No evidence of acute diverticulitis. 2. Status post hysterectomy. 3. Atherosclerotic calcifications of abdominal aorta and iliac arteries. 4. No pericecal inflammation.  No small bowel obstruction.   Electronically Signed   By: Lahoma Crocker M.D.   On: 05/29/2013 19:59    EKG Interpretation   None       MDM   Final diagnoses:  Rectal bleeding  Diverticular disease  HTN (hypertension)  GERD (gastroesophageal reflux disease)   Medications  sodium chloride 0.9 % bolus 1,000 mL (0 mLs Intravenous Stopped 05/29/13 2007)  iohexol (OMNIPAQUE) 300 MG/ML solution 25 mL (25 mLs Oral Contrast Given 05/29/13 1930)  iohexol (OMNIPAQUE) 300 MG/ML solution 100 mL (100 mLs Intravenous Contrast Given 05/29/13 1930)  sodium chloride 0.9 % bolus 1,000 mL (1,000 mLs Intravenous New Bag/Given 05/29/13 2018)   Filed Vitals:   05/29/13 1855  BP: 154/112  Pulse: 73  Temp: 98.1 F (36.7 C)  Resp: 14    Patient presenting to  emergency department with rectal bleeding that has been ongoing since Saturday-reported that each bowel movement she's noticed a black tarry stools and bloody stools. Reported right lower quadrant abdominal pain described as a needle sensation that is constant since Saturday. Reported nausea without episodes of emesis. Patient was seen at [facility] hospital prior to arrival to the ED-was recommended to come here for evaluation. Denied ever having this issue before. Alert and oriented. GCS 15. Pale. Heart rate normal-negative tachycardia noted, murmur, AV stenosis noted. Lungs clear to auscultation. Radial and DP pulses 2+ bilaterally. Bowel sounds normal active in all 4 quadrants, discomfort upon palpation to the abdomen-generalized. Most discomfort upon palpation to right lower quadrant-positive Murphy's and  McBurney's point.-negative guarding or rigidity noted. Full range of motion to upper lower extremities bilaterally without difficulty or ataxia. Strength intact, equal grip strength. Cap refill less than 3 seconds. EKG noted normal sinus rhythm with a heart rate of 64 beats per minute-negative ischemic findings noted. First troponin negative elevation. Second troponin negative elevation. CBC noted negative drop in red blood cells-RBC 4.04. Hemoglobin 11.6, hematocrit 35.8. When compared to measurements that was performed at 1:50 PM this afternoon hemoglobin has dropped from 12.2 to 11.6, hematocrit drop from 37.2 to 35.8. CMP negative findings. INR and PTT within normal limits. Fecal occult positive. CT abdomen and pelvis with contrast negative for acute inflammatory process-scattered diverticula are noted, multiple sigmoid colon diverticula-negative evidence of diverticulitis. Negative findings for small bowel obstruction.  Patient to be admitted to the hospital regarding GI bleed and near syncope today - patient has history of diverticular disease. This provider spoke with Dr. Clementeen Graham - patient to be  admitted to the hospital to Telemetry for GI bleed. Gi to consult. Patient agreed to plan of admission. Patient stable for transfer.   Jamse Mead, PA-C 05/31/13 1128

## 2013-05-29 NOTE — MAU Note (Addendum)
Rectal bleeding started Sat morning. Hx of this, had hemorrhoids- requiring surgery.  Pain in rt groin only- feels a little swollen there.  Was dizzy this morning.  Some nausea,reports decrease in appetite

## 2013-05-29 NOTE — ED Notes (Signed)
Patient transported to CT 

## 2013-05-29 NOTE — ED Notes (Signed)
Per family, patient was at [facility] today for the same symptoms-was told to come to [facility] ED for eval-states blood with BM-states a lot of bright red blood and some that is darker-states feeling dizzy, and fatiqued

## 2013-05-29 NOTE — Discharge Instructions (Signed)
Bloody Stools Bloody stools means there is blood in your poop (stool). It is a sign that there is a problem somewhere in the digestive system. It is important for your doctor to find the cause of your bleeding, so the problem can be treated.  HOME CARE  Only take medicine as told by your doctor.  Eat foods with fiber (prunes, bran cereals).  Drink enough fluids to keep your pee (urine) clear or pale yellow.  Sit in warm water (sitz bath) for 10 to 15 minutes as told by your doctor.  Know how to take your medicines (enemas, suppositories) if advised by your doctor.  Watch for signs that you are getting better or getting worse. GET HELP RIGHT AWAY IF:   You are not getting better.  You start to get better but then get worse again.  You have new problems.  You have severe bleeding from the place where poop comes out (rectum) that does not stop.  You throw up (vomit) blood.  You feel weak or pass out (faint).  You have a fever. MAKE SURE YOU:   Understand these instructions.  Will watch your condition.  Will get help right away if you are not doing well or get worse. Document Released: 03/18/2009 Document Revised: 06/22/2011 Document Reviewed: 08/15/2010 [facility] Patient Information 2014 [place].

## 2013-05-29 NOTE — MAU Provider Note (Signed)
@[email or web address] @  Chief Complaint:  Rectal Bleeding   [name] is  78 y.o. [phone] presents complaining of Rectal Bleeding Pt states that on Saturday after having a bowel movement, she wiped and noticed dark red/brown blood on the tissue. She also looked in the toilet and noticed blood mixed with feces. Pt went on to have 3 more bowel movt's on the same day with the same findings. She has been having recurrence of this symptoms till now. This is out of context for the pt as she is normally constipated. Bowel mov'ts were soft in consistency, no associated pain in anus during BM.   Pt does not bleed per rectum independent of bowel movt.  Pt also complains of right lower quadrant pain, nausea and dizziness(since Saturday). Admits to not eating much over the last 2 days as she is scared. Hx of hypoglycemia.  Denies vomiting, heartburn, epigastric pain, diarrhea or previous similar attacks  Hx of hemorrhoidectomy. States this bleeding is different from that of the hemorrhoids. The former was bright red in color  Significant past hx for GERD and Diverticular Disease  Obstetrical/Gynecological History: OB History   Grav Para Term Preterm Abortions TAB SAB Ect Mult Living   4 3 3  1  1   3      Past Medical History: Past Medical History  Diagnosis Date  . Hypertension   . GERD (gastroesophageal reflux disease)   . Hyperlipidemia   . Diverticular disease   . Chronic headaches   . Bruises easily   . History of kidney stones   . History of blurred vision   . CAD (coronary artery disease) CARDIOLOGIST- DR Jenkins [place]-- LAST VISIT IN [facility]  . Aortic valve stenosis, mild   . Heart murmur   . Spinal stenosis, multilevel   . Spondylosis, cervical   . Fibromyalgia   . Labral tear of long head of biceps tendon RIGHT SHOULDER  . Right rotator cuff tear   . OSA (obstructive sleep apnea) CPAP INTOLERANT   . H/O hypoglycemia   . Eczema     Past Surgical History: Past Surgical History   Procedure Laterality Date  . Umbilical hernia repair  JUNE 2006  . Vaginal hysterectomy  01-31-2001    ANTERIOR & POSTERIOR REPAIR/ TRANSVAGINAL BLADDER SLING  . Anterior cervical decomp/discectomy fusion  11-11-1999    C5 - C6  . Thyroidectomy  AGE 81    GOITER  . Hemorrhoidectomy with hemorrhoid banding  08-07-2010  . Laparoscopic cholecystectomy  1990  . Appendectomy  AGE 12  . Tonsillectomy and adenoidectomy  AGE 67  . Cataract extraction w/ intraocular lens  implant, bilateral    . Transthoracic echocardiogram  01-20-2012    MILD LVH/ NORMAL LVSF/ EF 60-65%/ MILD AORTIC VALVE STENOSIS/ MILD MV REGURG.  . Cardiac catheterization  2008  DR [facility]    ESSENTIALLY NORMAL  . Shoulder arthroscopy with open rotator cuff repair and distal clavicle acrominectomy Right 05/25/2012    Procedure: SHOULDER ARTHROSCOPY WITH OPEN ROTATOR CUFF REPAIR AND DISTAL CLAVICLE ACROMINECTOMY;  Surgeon: Magnus Sinning, MD;  Location: [place];  Service: Orthopedics;  Laterality: Right;  RIGHT SHOULDER ARTHROSCOPY WITH DERBRIDEMENTOF LABRAL/BICEP TENDON, OPEN DISTAL CLAVICLE RESECTION, ANTERIOR ACROMINECTOMY ROTATOR CUFF REPAIR ANESTHESIA: GENERAL/SCALENE NERVE BLOCK    Family History: History reviewed. No pertinent family history.  Social History: History  Substance Use Topics  . Smoking status: Never Smoker   . Smokeless tobacco: Never Used  . Alcohol Use: Yes  Comment: once in a while    Allergies:  Allergies  Allergen Reactions  . Latex Dermatitis and Rash    "with  extended exposure"  . Tape Other (See Comments)    Dermatitis rash "with extended exposure"    Meds:  Prescriptions prior to admission  Medication Sig Dispense Refill  . aspirin 81 MG tablet Take 81 mg by mouth daily.       Marland Kitchen atorvastatin (LIPITOR) 40 MG tablet Take 1 tablet (40 mg total) by mouth daily at 6 PM.  30 tablet  5  . Calcium-Vitamin D 600-125 MG-UNIT TABS Take 1 tablet by mouth at bedtime.        . clobetasol cream (TEMOVATE) [id number] % Apply 1 application topically 2 (two) times daily as needed (for rash on pelvic area).      . Coenzyme Q10 (COQ-10 PO) Take 1 tablet by mouth daily.      Marland Kitchen gabapentin (NEURONTIN) 600 MG tablet Take 1 tablet by mouth 3 (three) times daily.      Marland Kitchen ibuprofen (ADVIL,MOTRIN) 200 MG tablet Take 400-600 mg by mouth daily as needed for moderate pain.      . metoprolol (LOPRESSOR) 50 MG tablet Take 1 tablet (50 mg total) by mouth 2 (two) times daily.  180 tablet  1  . omeprazole (PRILOSEC) 20 MG capsule Take 1 capsule (20 mg total) by mouth every evening.  30 capsule  5   Results for orders placed during the hospital encounter of 05/29/13 (from the past 48 hour(s))  CBC     Status: Abnormal   Collection Time    05/29/13  1:50 PM      Result Value Ref Range   WBC 10.7 (*) 4.0 - 10.5 K/uL   RBC 4.25  3.87 - 5.11 MIL/uL   Hemoglobin 12.2  12.0 - 15.0 g/dL   HCT 37.2  36.0 - 46.0 %   MCV 87.5  78.0 - 100.0 fL   MCH 28.7  26.0 - 34.0 pg   MCHC 32.8  30.0 - 36.0 g/dL   RDW 13.4  11.5 - 15.5 %   Platelets 240  150 - 400 K/uL  GLUCOSE, CAPILLARY     Status: None   Collection Time    05/29/13  2:33 PM      Result Value Ref Range   Glucose-Capillary 94  70 - 99 mg/dL   Review of Systems -   Review of Systems  Constitutional: Negative for fever HENT: Negative for hearing loss, ear pain, nosebleeds, congestion, sore throat, neck pain, tinnitus and ear discharge.   Eyes: Negative for blurred vision, double vision, photophobia, pain, discharge and redness.  Respiratory: Negative for cough, hemoptysis, sputum production, shortness of breath, wheezing and stridor.   Cardiovascular: Negative for chest pain, palpitations, orthopnea,  leg swelling  Gastrointestinal: Positive for abdominal pain, nausea, blood in stool. Negative for heartburn, vomiting, diarrhea, constipation Genitourinary: Negative for dysuria, urgency, frequency, hematuria and flank pain.   Musculoskeletal: Negative for myalgias, back pain, joint pain and falls.  Skin: Negative for itching and rash.  Neurological: Positive for dizziness, headaches. Negative for tingling, tremors, sensory change, speech change, focal weakness, seizures, loss of consciousness, weakness.    Physical Exam  Blood pressure 105/51, pulse 103, temperature 98.5 F (36.9 C), temperature source Oral, resp. rate 18, SpO2 98.00%. GENERAL: Well-developed, well-nourished female in no acute distress.  LUNGS: Clear to auscultation bilaterally.  HEART: Regular rate and rhythm. Audible systolic murmur  ABDOMEN: Normal appearance,  no distension, visible masses or pulsations. Subcostal and McBurney Incisional Scars identified. Superficial palpation unremarkable Deep palpation revealed tenderness in the Right Lower Quadrant  DIGITAL RECTAL EXAM: Inspection showed multiple skin tags, no visible fissures or pain on spreading. No visible hemorrhoids No palpable masses or lesions in anal canal and rectum. Pain not out of proportion to what is expected Dark red blood on gloved finger when removed   Assessment: [name] is  78 y.o. [phone] at Unknown presents with dark red Rectal Bleeding.   Plan: CBC and Orthostatics to rule out Hemodynamic Instability Transfer to [facility] ED for further assessment- evaluation. Pt is to transfer via private vehicle, patients daughter has agreed to take her directly there.  Pt discharged from MAU in stable condition   Cherlyn Roberts 2/16/20151:30 PM  Dr. Jeneen Rinks at [facility] notified of patients transfer via private vehicle to [facility] ED for further evaluation/ workup of rectal bleeding.   Evaluation and management procedures were performed by the medical student under my supervision and collaboration. I have reviewed the note and chart, and I agree with the management and plan.  Darrelyn Hillock Aislee Landgren, NP 05/29/2013 3:31 PM

## 2013-05-30 DIAGNOSIS — I359 Nonrheumatic aortic valve disorder, unspecified: Secondary | ICD-10-CM

## 2013-05-30 DIAGNOSIS — Z791 Long term (current) use of non-steroidal anti-inflammatories (NSAID): Secondary | ICD-10-CM

## 2013-05-30 DIAGNOSIS — K644 Residual hemorrhoidal skin tags: Secondary | ICD-10-CM

## 2013-05-30 DIAGNOSIS — K625 Hemorrhage of anus and rectum: Principal | ICD-10-CM

## 2013-05-30 DIAGNOSIS — K922 Gastrointestinal hemorrhage, unspecified: Secondary | ICD-10-CM | POA: Diagnosis present

## 2013-05-30 DIAGNOSIS — D649 Anemia, unspecified: Secondary | ICD-10-CM

## 2013-05-30 HISTORY — DX: Long term (current) use of non-steroidal anti-inflammatories (nsaid): Z79.1

## 2013-05-30 HISTORY — DX: Gastrointestinal hemorrhage, unspecified: K92.2

## 2013-05-30 LAB — CBC
HCT: 33.4 % — ABNORMAL LOW (ref 36.0–46.0)
HEMATOCRIT: 33.1 % — AB (ref 36.0–46.0)
HEMATOCRIT: 31.6 % — AB (ref 36.0–46.0)
HEMATOCRIT: 32.5 % — AB (ref 36.0–46.0)
HEMOGLOBIN: 10.9 g/dL — AB (ref 12.0–15.0)
HEMOGLOBIN: 11 g/dL — AB (ref 12.0–15.0)
HEMOGLOBIN: 10.5 g/dL — AB (ref 12.0–15.0)
HEMOGLOBIN: 10.8 g/dL — AB (ref 12.0–15.0)
MCH: 29.2 pg (ref 26.0–34.0)
MCH: 29.3 pg (ref 26.0–34.0)
MCH: 29.6 pg (ref 26.0–34.0)
MCH: 29.8 pg (ref 26.0–34.0)
MCHC: 32.9 g/dL (ref 30.0–36.0)
MCHC: 32.9 g/dL (ref 30.0–36.0)
MCHC: 33.2 g/dL (ref 30.0–36.0)
MCHC: 33.2 g/dL (ref 30.0–36.0)
MCV: 88.7 fL (ref 78.0–100.0)
MCV: 88.8 fL (ref 78.0–100.0)
MCV: 89 fL (ref 78.0–100.0)
MCV: 89.5 fL (ref 78.0–100.0)
PLATELETS: 211 10*3/uL (ref 150–400)
Platelets: 209 10*3/uL (ref 150–400)
Platelets: 208 10*3/uL (ref 150–400)
Platelets: 217 10*3/uL (ref 150–400)
RBC: 3.73 MIL/uL — ABNORMAL LOW (ref 3.87–5.11)
RBC: 3.76 MIL/uL — AB (ref 3.87–5.11)
RBC: 3.55 MIL/uL — ABNORMAL LOW (ref 3.87–5.11)
RBC: 3.63 MIL/uL — AB (ref 3.87–5.11)
RDW: 13.4 % (ref 11.5–15.5)
RDW: 13.4 % (ref 11.5–15.5)
RDW: 13.2 % (ref 11.5–15.5)
RDW: 13.4 % (ref 11.5–15.5)
WBC: 6.9 10*3/uL (ref 4.0–10.5)
WBC: 6 10*3/uL (ref 4.0–10.5)
WBC: 5.4 10*3/uL (ref 4.0–10.5)
WBC: 5.5 10*3/uL (ref 4.0–10.5)

## 2013-05-30 LAB — ABO/RH: ABO/RH(D): B POS

## 2013-05-30 LAB — TYPE AND SCREEN
ABO/RH(D): B POS
Antibody Screen: NEGATIVE

## 2013-05-30 MED ORDER — SODIUM CHLORIDE 0.9 % IJ SOLN
3.0000 mL | Freq: Two times a day (BID) | INTRAMUSCULAR | Status: DC
  Administered 2013-05-30 – 2013-06-02 (×2): 3 mL via INTRAVENOUS

## 2013-05-30 MED ORDER — ACETAMINOPHEN 650 MG RE SUPP: 650.0000 mg | Freq: Four times a day (QID) | RECTAL | Status: DC | PRN

## 2013-05-30 MED ORDER — CALCIUM CARBONATE-VITAMIN D 500-200 MG-UNIT PO TABS
1.0000 | ORAL_TABLET | Freq: Every day | ORAL | Status: DC
  Administered 2013-05-30 – 2013-06-01 (×3): 1 via ORAL
  Filled 2013-05-30 (×5): qty 1

## 2013-05-30 MED ORDER — CYCLOSPORINE 0.05 % OP EMUL
1.0000 [drp] | Freq: Two times a day (BID) | OPHTHALMIC | Status: DC
  Administered 2013-05-30 – 2013-06-02 (×7): 1 [drp] via OPHTHALMIC
  Filled 2013-05-30 (×9): qty 1

## 2013-05-30 MED ORDER — TRAMADOL HCL 50 MG PO TABS
50.0000 mg | ORAL_TABLET | Freq: Four times a day (QID) | ORAL | Status: DC | PRN
  Administered 2013-06-01: 50 mg via ORAL
  Filled 2013-05-30: qty 1

## 2013-05-30 MED ORDER — GABAPENTIN 300 MG PO CAPS
600.0000 mg | ORAL_CAPSULE | Freq: Three times a day (TID) | ORAL | Status: DC
Start: 2013-05-30 — End: 2013-06-02
  Administered 2013-05-30 – 2013-06-02 (×10): 600 mg via ORAL
  Filled 2013-05-30 (×12): qty 2

## 2013-05-30 MED ORDER — PANTOPRAZOLE SODIUM 40 MG PO TBEC
40.0000 mg | DELAYED_RELEASE_TABLET | Freq: Two times a day (BID) | ORAL | Status: DC
  Administered 2013-05-30 – 2013-06-02 (×6): 40 mg via ORAL
  Filled 2013-05-30 (×7): qty 1

## 2013-05-30 MED ORDER — PANTOPRAZOLE SODIUM 40 MG IV SOLR
40.0000 mg | Freq: Two times a day (BID) | INTRAVENOUS | Status: DC
  Administered 2013-05-30 (×2): 40 mg via INTRAVENOUS
  Filled 2013-05-30 (×3): qty 40

## 2013-05-30 MED ORDER — SODIUM CHLORIDE 0.9 % IV SOLN
INTRAVENOUS | Status: DC
  Administered 2013-05-30 – 2013-05-31 (×3): via INTRAVENOUS
  Administered 2013-06-01: 500 mL via INTRAVENOUS
  Administered 2013-06-01: 09:00:00 via INTRAVENOUS

## 2013-05-30 MED ORDER — METOPROLOL TARTRATE 50 MG PO TABS
50.0000 mg | ORAL_TABLET | Freq: Two times a day (BID) | ORAL | Status: DC
  Administered 2013-05-30 – 2013-06-02 (×6): 50 mg via ORAL
  Filled 2013-05-30 (×9): qty 1

## 2013-05-30 MED ORDER — ACETAMINOPHEN 325 MG PO TABS: 650.0000 mg | ORAL_TABLET | Freq: Four times a day (QID) | ORAL | Status: DC | PRN

## 2013-05-30 MED ORDER — POLYVINYL ALCOHOL 1.4 % OP SOLN
2.0000 [drp] | Freq: Three times a day (TID) | OPHTHALMIC | Status: DC | PRN
  Administered 2013-05-30: 2 [drp] via OPHTHALMIC
  Filled 2013-05-30: qty 15

## 2013-05-30 MED ORDER — ATORVASTATIN CALCIUM 40 MG PO TABS
40.0000 mg | ORAL_TABLET | Freq: Every day | ORAL | Status: DC
  Administered 2013-05-30 – 2013-06-01 (×3): 40 mg via ORAL
  Filled 2013-05-30 (×4): qty 1

## 2013-05-30 MED ORDER — COQ-10 10 MG PO CAPS: 1.0000 | ORAL_CAPSULE | Freq: Every day | ORAL | Status: DC

## 2013-05-30 NOTE — Progress Notes (Signed)
PHARMACIST - PHYSICIAN ORDER COMMUNICATION  CONCERNING: P&T Medication Policy on Herbal Medications  DESCRIPTION:  This patient's order for:  CoQ 10  has been noted.  This product(s) is classified as an "herbal" or natural product. Due to a lack of definitive safety studies or FDA approval, nonstandard manufacturing practices, plus the potential risk of unknown drug-drug interactions while on inpatient medications, the Pharmacy and Therapeutics Committee does not permit the use of "herbal" or natural products of this type within [facility].   ACTION TAKEN: The pharmacy department is unable to verify this order at this time and your patient has been informed of this safety policy. Please reevaluate patient's clinical condition at discharge and address if the herbal or natural product(s) should be resumed at that time.  Dorrene German 05/30/2013 12:36 AM

## 2013-05-30 NOTE — Progress Notes (Signed)
UR review completed. 

## 2013-05-30 NOTE — Progress Notes (Signed)
[facility] PROGRESS NOTE  [name] QIO:[id number] DOB: 1935/11/07 DOA: 05/29/2013 PCP: [name], MD  Assessment/Plan:  Rectal bleeding  Possibly diverticular bleed vs internal hemorrhoids. however given nature of bleed including dark stools and being on NSAIDs cannot r/o UGI bleed.  -PO PPI twice a day. Avoid NSAIDs. Will hold aspirin.  -Patient seen by [name] previously.  -Continue full liquids    Anemia  -Secondary to GI bleed -Stable; D/C in Am if Stable   Near syncope  Likely secondary to blood loss anemia. Take orthostatic vitals. No EKG changes. Monitor on telemetry. 2 sets of troponin negative.   Hypertension  Resume home medications   GERD  -PO Protonix 40mg  BID   Aortic stenosis, moderate  Follows with [name]  Fibromyalgia -Counseled patient she could not use NSAIDs again  -Start on tramadol -Continue Neurontin    Code Status: Full Family Communication: None Disposition Plan: Discharge in a.m. if hemoglobin stable   Consultants: [name](GI)    Procedures: CT abdomen pelvis with contrast 05/29/2013 1. No acute inflammatory process within abdomen or pelvis. Scattered  diverticula are noted left colon. Multiple sigmoid colon  diverticula. No evidence of acute diverticulitis.  2. Status post hysterectomy.  3. Atherosclerotic calcifications of abdominal aorta and iliac  arteries.  4. No pericecal inflammation. No small bowel obstruction.    Antibiotics:     HPI/Subjective: 78yo WF PMHx fibromyalgia, NSAID overuse, hypertension, hyperlipidemia, moderate aortic stenosis, GERD, history of diverticulosis and internal hemorrhoids (colonoscopy in 2011 by [name]) presented to the ED with rectal bleed since 2 days. Patient noticed dark stool occasionally mixed with bright blood when cleaning herself after bowel movement. She reports having 3 bowel movements on the first day ,1 BM yesterday and about 4-5 BM today. She  reports that with every bowel movement she has had dark stools occasionally mixed with bright blood. She denies any pain associated with bowel movements. This morning she started having some cramps over her right lower quadrant and also felt dizzy and diaphoretic take with near syncopal symptoms. She did have some blurred vision as well. She reports that she had not eaten anything since this morning. She also informs taking ibuprofen 400 mg about 2-3 tablets every day for her fibromyalgia symptoms. She has not taken any and states on her aspirin since the last 2 days. She denies any epigastric pain or hematemesis. As noted above she had a colonoscopy done in 2011 which showed diverticulosis and internal hemorrhoids. 2/17 patient sitting comfortably in bed negative N./V., states had black BM earlier.    Objective: Filed Vitals:   05/30/13 0029 05/30/13 0030 05/30/13 0031 05/30/13 0443  BP: 135/51 148/67 136/73 125/57  Pulse: 74 76 90 80  Temp: 98.6 F (37 C)   98.8 F (37.1 C)  TempSrc: Oral   Oral  Resp: 12 16 20 12   Height:      Weight:      SpO2: 97%  98% 99%    Intake/Output Summary (Last 24 hours) at 05/30/13 0808 Last data filed at 05/30/13 0800  Gross per 24 hour  Intake  277.5 ml  Output    900 ml  Net -622.5 ml   Filed Weights   05/29/13 1602 05/30/13 0025  Weight: 73.483 kg (162 lb) 69.4 kg (153 lb)    Exam:   General:  A./O. x4, NAD  Cardiovascular: Regular rhythm and rate, negative murmurs rubs gallops, DP/PT pulse plus one  Respiratory: Clear to  auscultation bilateral  Abdomen: Right lower quadrant/left lower quadrant tenderness to palpation  Musculoskeletal: Negative pedal edema   Data Reviewed: Basic Metabolic Panel:  Recent Labs Lab 05/29/13 1645  NA 139  K 4.5  CL 102  CO2 25  GLUCOSE 94  BUN 13  CREATININE 0.66  CALCIUM 9.5   Liver Function Tests:  Recent Labs Lab 05/29/13 1645  AST 19  ALT 16  ALKPHOS 75  BILITOT 0.5  PROT 6.9   ALBUMIN 3.6   No results found for this basename: LIPASE, AMYLASE,  in the last 168 hours No results found for this basename: AMMONIA,  in the last 168 hours CBC:  Recent Labs Lab 05/29/13 1350 05/29/13 1645 05/30/13 0145 05/30/13 0800  WBC 10.7* 8.4 6.9 6.0  NEUTROABS  --  5.9  --   --   HGB 12.2 11.6* 10.9* 11.0*  HCT 37.2 35.8* 33.1* 33.4*  MCV 87.5 88.6 88.7 88.8  PLT 240 237 209 211   Cardiac Enzymes:  Recent Labs Lab 05/29/13 1645 05/29/13 2015  TROPONINI <0.30 <0.30   BNP (last 3 results) No results found for this basename: PROBNP,  in the last 8760 hours CBG:  Recent Labs Lab 05/29/13 1433  GLUCAP 94    No results found for this or any previous visit (from the past 240 hour(s)).   Studies: Ct Abdomen Pelvis W Contrast  05/29/2013   CLINICAL DATA:  Dizziness, abdominal pain, rectal bleeding  EXAM: CT ABDOMEN AND PELVIS WITH CONTRAST  TECHNIQUE: Multidetector CT imaging of the abdomen and pelvis was performed using the standard protocol following bolus administration of intravenous contrast.  CONTRAST:  10mL OMNIPAQUE IOHEXOL 300 MG/ML SOLN, 192mL OMNIPAQUE IOHEXOL 300 MG/ML SOLN  COMPARISON:  02/23/2008  FINDINGS: Lung bases are unremarkable. Sagittal images of the spine shows osteopenia and degenerative changes lumbar spine. Liver, pancreas, spleen and adrenals are unremarkable. Kidneys are symmetrical in size and enhancement. No hydronephrosis or hydroureter. A cyst in left kidney measures 1 cm stable from prior exam.  Extensive atherosclerotic calcifications of abdominal aorta and iliac arteries. No aortic aneurysm.  Delayed renal images shows bilateral renal symmetrical excretion. Bilateral visualized proximal ureter is unremarkable.  There is no small bowel obstruction. No ascites or free air. No adenopathy. There is no pericecal inflammation. Multiple sigmoid colon diverticula are noted. No evidence of acute diverticulitis. Few diverticula are noted left colon.  No evidence of acute diverticulitis. The urinary bladder is unremarkable. The patient is status post hysterectomy. No destructive bony lesions are noted within pelvis.  IMPRESSION: 1. No acute inflammatory process within abdomen or pelvis. Scattered diverticula are noted left colon. Multiple sigmoid colon diverticula. No evidence of acute diverticulitis. 2. Status post hysterectomy. 3. Atherosclerotic calcifications of abdominal aorta and iliac arteries. 4. No pericecal inflammation.  No small bowel obstruction.   Electronically Signed   By: Lahoma Crocker M.D.   On: 05/29/2013 19:59    Scheduled Meds: . atorvastatin  40 mg Oral q1800  . calcium-vitamin D  1 tablet Oral QHS  . cycloSPORINE  1 drop Both Eyes BID  . gabapentin  600 mg Oral TID  . metoprolol  50 mg Oral BID  . pantoprazole (PROTONIX) IV  40 mg Intravenous Q12H  . sodium chloride  3 mL Intravenous Q12H   Continuous Infusions: . sodium chloride 50 mL/hr at 05/30/13 0104    Principal Problem:   Rectal bleeding Active Problems:   ANEMIA-UNSPECIFIED   HYPERTENSION, UNSPECIFIED   HEMORRHOIDS, INTERNAL  GERD   DIVERTICULOSIS-COLON   FIBROMYALGIA   Aortic stenosis, moderate   Near syncope   GI bleed    Time spent:26min   WOODS, Geraldo Docker  [facility] Pager [phone]. If 7PM-7AM, please contact night-coverage at [email or web address], password [email or web address] 05/30/2013, 8:08 AM  LOS: 1 day

## 2013-05-30 NOTE — Consult Note (Signed)
GI Consultation  Referring Provider: No ref. provider found Primary Care Physician:  [name], MD Primary Gastroenterologist:  Dr.  Carlean Purl  Reason for Consultation:  *GI bleed**  HPI: [name] is a 78 y.o. female *admitted with hematochezia.  She has a history of diverticulosis determined by colonoscopy in 2011.  Internal hemorrhoids were also seen.  In the 2 days prior to admission and the day of admission she had several bloody bowel movements consisting of bright red blood mixed with her stools as well as dark blood.  She's had minimal right lower quadrant pain.  Prior to this there has been no change in her bowel habits.  She suffers from chronic constipation.  She takes ibuprofen several times a week.  She's also on Protonix.  Since admission last evening she's had no further bowel movements and abdominal discomfort has entirely subsided.**   Past Medical History  Diagnosis Date  . Hypertension   . GERD (gastroesophageal reflux disease)   . Hyperlipidemia   . Diverticular disease   . Chronic headaches   . Bruises easily   . History of kidney stones   . History of blurred vision   . CAD (coronary artery disease) CARDIOLOGIST- DR Jenkins [place]-- LAST VISIT IN [facility]  . Aortic valve stenosis, mild   . Heart murmur   . Spinal stenosis, multilevel   . Spondylosis, cervical   . Fibromyalgia   . Labral tear of long head of biceps tendon RIGHT SHOULDER  . Right rotator cuff tear   . OSA (obstructive sleep apnea) CPAP INTOLERANT   . H/O hypoglycemia   . Eczema   . Complication of anesthesia     bad reaction to ether    Past Surgical History  Procedure Laterality Date  . Umbilical hernia repair  JUNE 2006  . Vaginal hysterectomy  01-31-2001    ANTERIOR & POSTERIOR REPAIR/ TRANSVAGINAL BLADDER SLING  . Anterior cervical decomp/discectomy fusion  11-11-1999    C5 - C6  . Thyroidectomy  AGE 89    GOITER  . Hemorrhoidectomy with hemorrhoid banding  08-07-2010    . Laparoscopic cholecystectomy  1990  . Appendectomy  AGE 68  . Tonsillectomy and adenoidectomy  AGE 23  . Cataract extraction w/ intraocular lens  implant, bilateral    . Transthoracic echocardiogram  01-20-2012    MILD LVH/ NORMAL LVSF/ EF 60-65%/ MILD AORTIC VALVE STENOSIS/ MILD MV REGURG.  . Cardiac catheterization  2008  DR [facility]    ESSENTIALLY NORMAL  . Shoulder arthroscopy with open rotator cuff repair and distal clavicle acrominectomy Right 05/25/2012    Procedure: SHOULDER ARTHROSCOPY WITH OPEN ROTATOR CUFF REPAIR AND DISTAL CLAVICLE ACROMINECTOMY;  Surgeon: Magnus Sinning, MD;  Location: [place];  Service: Orthopedics;  Laterality: Right;  RIGHT SHOULDER ARTHROSCOPY WITH DERBRIDEMENTOF LABRAL/BICEP TENDON, OPEN DISTAL CLAVICLE RESECTION, ANTERIOR ACROMINECTOMY ROTATOR CUFF REPAIR ANESTHESIA: GENERAL/SCALENE NERVE BLOCK    Prior to Admission medications   Medication Sig Start Date End Date Taking? Authorizing Provider  aspirin 81 MG tablet Take 81 mg by mouth daily.    Yes Historical Provider, MD  atorvastatin (LIPITOR) 40 MG tablet Take 1 tablet (40 mg total) by mouth daily at 6 PM. 02/17/13  Yes Mary-Margaret Hassell Done, FNP  Calcium-Vitamin D 600-125 MG-UNIT TABS Take 1 tablet by mouth at bedtime.    Yes Historical Provider, MD  clobetasol cream (TEMOVATE) [id number] % Apply 1 application topically 2 (two) times daily  as needed (for rash on pelvic area).   Yes Historical Provider, MD  Coenzyme Q10 (COQ-10 PO) Take 1 tablet by mouth daily.   Yes Historical Provider, MD  cycloSPORINE (RESTASIS) 0.05 % ophthalmic emulsion Place 1 drop into both eyes 2 (two) times daily.   Yes Historical Provider, MD  gabapentin (NEURONTIN) 600 MG tablet Take 1 tablet by mouth 3 (three) times daily. 02/17/13  Yes Historical Provider, MD  hydroxypropyl methylcellulose (ISOPTO TEARS) 2.5 % ophthalmic solution Place 2 drops into both eyes 3 (three) times daily as needed for dry eyes.   Yes  Historical Provider, MD  metoprolol (LOPRESSOR) 50 MG tablet Take 1 tablet (50 mg total) by mouth 2 (two) times daily. 02/17/13  Yes Mary-Margaret Hassell Done, FNP  omeprazole (PRILOSEC) 20 MG capsule Take 1 capsule (20 mg total) by mouth every evening. 02/17/13  Yes Mary-Margaret Hassell Done, FNP    Current Facility-Administered Medications  Medication Dose Route Frequency Provider Last Rate Last Dose  . 0.9 %  sodium chloride infusion   Intravenous Continuous Nishant Dhungel, MD 50 mL/hr at 05/30/13 0104    . acetaminophen (TYLENOL) tablet 650 mg  650 mg Oral Q6H PRN Nishant Dhungel, MD       Or  . acetaminophen (TYLENOL) suppository 650 mg  650 mg Rectal Q6H PRN Nishant Dhungel, MD      . atorvastatin (LIPITOR) tablet 40 mg  40 mg Oral q1800 Nishant Dhungel, MD      . calcium-vitamin D (OSCAL WITH D) 500-200 MG-UNIT per tablet 1 tablet  1 tablet Oral QHS Nishant Dhungel, MD      . cycloSPORINE (RESTASIS) 0.05 % ophthalmic emulsion 1 drop  1 drop Both Eyes BID Nishant Dhungel, MD      . gabapentin (NEURONTIN) capsule 600 mg  600 mg Oral TID Nishant Dhungel, MD      . metoprolol (LOPRESSOR) tablet 50 mg  50 mg Oral BID Nishant Dhungel, MD      . pantoprazole (PROTONIX) injection 40 mg  40 mg Intravenous Q12H Nishant Dhungel, MD   40 mg at 05/30/13 0103  . polyvinyl alcohol (LIQUIFILM TEARS) 1.4 % ophthalmic solution 2 drop  2 drop Both Eyes TID PRN Nishant Dhungel, MD   2 drop at 05/30/13 0103  . sodium chloride 0.9 % injection 3 mL  3 mL Intravenous Q12H Nishant Dhungel, MD        Allergies as of 05/29/2013 - Review Complete 05/29/2013  Allergen Reaction Noted  . Latex Dermatitis and Rash 09/21/2011  . Tape Other (See Comments) 09/07/2012    History reviewed. No pertinent family history.  History   Social History  . Marital Status: Married    Spouse Name: N/A    Number of Children: N/A  . Years of Education: N/A   Occupational History  . Not on file.   Social History Main Topics  .  Smoking status: Never Smoker   . Smokeless tobacco: Never Used  . Alcohol Use: Yes     Comment: once in a while  . Drug Use: No  . Sexual Activity: Not on file   Other Topics Concern  . Not on file   Social History Narrative  . No narrative on file    Review of Systems: Pertinent positive and negative review of systems were noted in the above history of present illness section. All other review of systems were otherwise negative   Physical Exam: Vital signs in last 24 hours: Temp:  [98.1 F (36.7 C)-98.8  F (37.1 C)] 98.8 F (37.1 C) (02/17 0443) Pulse Rate:  [70-90] 80 (02/17 0443) Resp:  [12-20] 12 (02/17 0443) BP: (125-154)/(51-112) 125/57 mmHg (02/17 0443) SpO2:  [95 %-99 %] 99 % (02/17 0443) Weight:  [153 lb (69.4 kg)-162 lb (73.483 kg)] 153 lb (69.4 kg) (02/17 0025)  General:   Alert,  Well-developed, well-nourished, pleasant and cooperative in NAD Head:  Normocephalic and atraumatic. Eyes:  Sclera clear, no icterus.   Conjunctiva pink. Ears:  Normal auditory acuity. Nose:  No deformity, discharge,  or lesions. Mouth:  No deformity or lesions.  Oropharynx pink & moist. Neck:  Supple; no masses or thyromegaly. Lungs:  Clear throughout to auscultation.   No wheezes, crackles, or rhonchi. No acute distress. Heart:  Regular rate and rhythm; no  clicks, rubs,  or gallops.  There is a [id number] early systolic murmur at the left sternal border radiating to the right sternal border Abdomen:  Soft, nontender and nondistended. No masses, hepatosplenomegaly or hernias noted. Normal bowel sounds, without guarding, and without rebound.   Rectal:  Deferred until time of colonoscopy.   Msk:  Symmetrical without gross deformities. Normal posture. Pulses:  Normal pulses noted. Extremities:  Without clubbing or edema. Neurologic:  Alert and  oriented x4;  grossly normal neurologically. Skin:  Intact without significant lesions or rashes. Cervical Nodes:  No significant cervical  adenopathy. Psych:  Alert and cooperative. Normal mood and affect.  Intake/Output from previous day: 02/16 0701 - 02/17 0700 In: 277.5 [I.V.:277.5] Out: 650 [Urine:650] Intake/Output this shift: Total I/O In: 94.2 [I.V.:94.2] Out: 250 [Urine:250]  Lab Results:  Recent Labs  05/29/13 1645 05/30/13 0145 05/30/13 0800  WBC 8.4 6.9 6.0  HGB 11.6* 10.9* 11.0*  HCT 35.8* 33.1* 33.4*  PLT 237 209 211   BMET  Recent Labs  05/29/13 1645  NA 139  K 4.5  CL 102  CO2 25  GLUCOSE 94  BUN 13  CREATININE 0.66  CALCIUM 9.5   LFT  Recent Labs  05/29/13 1645  PROT 6.9  ALBUMIN 3.6  AST 19  ALT 16  ALKPHOS 75  BILITOT 0.5   PT/INR  Recent Labs  05/29/13 1645  LABPROT 13.1  INR 1.01   Hepatitis Panel No results found for this basename: HEPBSAG, HCVAB, HEPAIGM, HEPBIGM,  in the last 72 hours Additional Labs **  Studies/Results: Ct Abdomen Pelvis W Contrast  05/29/2013   CLINICAL DATA:  Dizziness, abdominal pain, rectal bleeding  EXAM: CT ABDOMEN AND PELVIS WITH CONTRAST  TECHNIQUE: Multidetector CT imaging of the abdomen and pelvis was performed using the standard protocol following bolus administration of intravenous contrast.  CONTRAST:  54mL OMNIPAQUE IOHEXOL 300 MG/ML SOLN, 17mL OMNIPAQUE IOHEXOL 300 MG/ML SOLN  COMPARISON:  02/23/2008  FINDINGS: Lung bases are unremarkable. Sagittal images of the spine shows osteopenia and degenerative changes lumbar spine. Liver, pancreas, spleen and adrenals are unremarkable. Kidneys are symmetrical in size and enhancement. No hydronephrosis or hydroureter. A cyst in left kidney measures 1 cm stable from prior exam.  Extensive atherosclerotic calcifications of abdominal aorta and iliac arteries. No aortic aneurysm.  Delayed renal images shows bilateral renal symmetrical excretion. Bilateral visualized proximal ureter is unremarkable.  There is no small bowel obstruction. No ascites or free air. No adenopathy. There is no pericecal  inflammation. Multiple sigmoid colon diverticula are noted. No evidence of acute diverticulitis. Few diverticula are noted left colon. No evidence of acute diverticulitis. The urinary bladder is unremarkable. The patient is status post hysterectomy.  No destructive bony lesions are noted within pelvis.  IMPRESSION: 1. No acute inflammatory process within abdomen or pelvis. Scattered diverticula are noted left colon. Multiple sigmoid colon diverticula. No evidence of acute diverticulitis. 2. Status post hysterectomy. 3. Atherosclerotic calcifications of abdominal aorta and iliac arteries. 4. No pericecal inflammation.  No small bowel obstruction.   Electronically Signed   By: Lahoma Crocker M.D.   On: 05/29/2013 19:59    Principal Problem:   Rectal bleeding Active Problems:   ANEMIA-UNSPECIFIED   HYPERTENSION, UNSPECIFIED   HEMORRHOIDS, INTERNAL   GERD   DIVERTICULOSIS-COLON   FIBROMYALGIA   Aortic stenosis, moderate   Near syncope   GI bleed   NSAID long-term use   Recommendations - *close observation.  Will place on full liquid diet.  Hold endoscopic studies unless she should have recurrent bleeding at which point I would consider a bleeding scan unless she has an acute bleed with drop in hemoglobin and elevated BUN, at which point I would consider upper endoscopy**  Assessment - * 1.  acute GI bleed.  Although patient is taking NSAIDs normal BUN and relatively minimal drop in hemoglobin strongly suggests that bleeding is from a lower GI source.  Suspect diverticular bleed.  Hemorrhoidal bleeding is less likely.  Note that she is taking a protonix. which may confer protection from her NSAID use. 2.  coronary artery disease-stable 3.  aortic stenosis       Sandy Salaam. Deatra Ina, MD, [facility] [phone]    05/30/2013, 9:29 AM

## 2013-05-31 DIAGNOSIS — K648 Other hemorrhoids: Secondary | ICD-10-CM

## 2013-05-31 LAB — CBC
HCT: 33.4 % — ABNORMAL LOW (ref 36.0–46.0)
HCT: 34.7 % — ABNORMAL LOW (ref 36.0–46.0)
HCT: 34.2 % — ABNORMAL LOW (ref 36.0–46.0)
HEMOGLOBIN: 11.1 g/dL — AB (ref 12.0–15.0)
Hemoglobin: 10.5 g/dL — ABNORMAL LOW (ref 12.0–15.0)
Hemoglobin: 11 g/dL — ABNORMAL LOW (ref 12.0–15.0)
MCH: 28.4 pg (ref 26.0–34.0)
MCH: 28.7 pg (ref 26.0–34.0)
MCH: 28.9 pg (ref 26.0–34.0)
MCHC: 31.4 g/dL (ref 30.0–36.0)
MCHC: 31.7 g/dL (ref 30.0–36.0)
MCHC: 32.5 g/dL (ref 30.0–36.0)
MCV: 90.3 fL (ref 78.0–100.0)
MCV: 90.6 fL (ref 78.0–100.0)
MCV: 89.1 fL (ref 78.0–100.0)
PLATELETS: 201 10*3/uL (ref 150–400)
Platelets: 199 10*3/uL (ref 150–400)
Platelets: 216 10*3/uL (ref 150–400)
RBC: 3.7 MIL/uL — ABNORMAL LOW (ref 3.87–5.11)
RBC: 3.83 MIL/uL — ABNORMAL LOW (ref 3.87–5.11)
RBC: 3.84 MIL/uL — AB (ref 3.87–5.11)
RDW: 13.3 % (ref 11.5–15.5)
RDW: 13.5 % (ref 11.5–15.5)
RDW: 13.3 % (ref 11.5–15.5)
WBC: 5.2 10*3/uL (ref 4.0–10.5)
WBC: 4.5 10*3/uL (ref 4.0–10.5)
WBC: 5.2 10*3/uL (ref 4.0–10.5)

## 2013-05-31 MED ORDER — HYDROCORTISONE ACETATE 25 MG RE SUPP
25.0000 mg | Freq: Two times a day (BID) | RECTAL | Status: DC
  Administered 2013-05-31 – 2013-06-02 (×5): 25 mg via RECTAL
  Filled 2013-05-31 (×6): qty 1

## 2013-05-31 NOTE — Progress Notes (Signed)
Progress Note   Subjective  No bleeding through the night but had a stool with blood this am. Blood described.   Objective   Vital signs in last 24 hours: Temp:  [98 F (36.7 C)-99 F (37.2 C)] 98 F (36.7 C) (02/18 0538) Pulse Rate:  [67-78] 67 (02/18 0538) Resp:  [14-16] 16 (02/18 0538) BP: (126-135)/(50-67) 135/56 mmHg (02/18 0538) SpO2:  [98 %-100 %] 98 % (02/18 0538) Last BM Date: 05/29/13 General:    white female in NAD Heart:  Regular rate and rhythm Abdomen:  Soft, nontender and nondistended. Normal bowel sounds. Extremities:  Without edema. Neurologic:  Alert and oriented,  grossly normal neurologically. Psych:  Cooperative. Normal mood and affect.    Lab Results:  Recent Labs  05/30/13 2038 05/31/13 0200 05/31/13 0813  WBC 5.5 5.2 4.5  HGB 10.8* 10.5* 11.0*  HCT 32.5* 33.4* 34.7*  PLT 217 201 199   BMET  Recent Labs  05/29/13 1645  NA 139  K 4.5  CL 102  CO2 25  GLUCOSE 94  BUN 13  CREATININE 0.66  CALCIUM 9.5   LFT  Recent Labs  05/29/13 1645  PROT 6.9  ALBUMIN 3.6  AST 19  ALT 16  ALKPHOS 75  BILITOT 0.5    Studies/Results: Ct Abdomen Pelvis W Contrast  05/29/2013   CLINICAL DATA:  Dizziness, abdominal pain, rectal bleeding  EXAM: CT ABDOMEN AND PELVIS WITH CONTRAST  TECHNIQUE: Multidetector CT imaging of the abdomen and pelvis was performed using the standard protocol following bolus administration of intravenous contrast.  CONTRAST:  67mL OMNIPAQUE IOHEXOL 300 MG/ML SOLN, 184mL OMNIPAQUE IOHEXOL 300 MG/ML SOLN  COMPARISON:  02/23/2008  FINDINGS: Lung bases are unremarkable. Sagittal images of the spine shows osteopenia and degenerative changes lumbar spine. Liver, pancreas, spleen and adrenals are unremarkable. Kidneys are symmetrical in size and enhancement. No hydronephrosis or hydroureter. A cyst in left kidney measures 1 cm stable from prior exam.  Extensive atherosclerotic calcifications of abdominal aorta and iliac arteries.  No aortic aneurysm.  Delayed renal images shows bilateral renal symmetrical excretion. Bilateral visualized proximal ureter is unremarkable.  There is no small bowel obstruction. No ascites or free air. No adenopathy. There is no pericecal inflammation. Multiple sigmoid colon diverticula are noted. No evidence of acute diverticulitis. Few diverticula are noted left colon. No evidence of acute diverticulitis. The urinary bladder is unremarkable. The patient is status post hysterectomy. No destructive bony lesions are noted within pelvis.  IMPRESSION: 1. No acute inflammatory process within abdomen or pelvis. Scattered diverticula are noted left colon. Multiple sigmoid colon diverticula. No evidence of acute diverticulitis. 2. Status post hysterectomy. 3. Atherosclerotic calcifications of abdominal aorta and iliac arteries. 4. No pericecal inflammation.  No small bowel obstruction.   Electronically Signed   By: Lahoma Crocker M.D.   On: 05/29/2013 19:59     Assessment / Plan:   1. GI bleed. Although patient is taking NSAIDs normal BUN and relatively minimal drop in hemoglobin speaks against upper GI bleed though this isn't total excluded. Her hgb is actually higher today (11.0). She does have some hemorrhoids on exam though the darker colored blood speaks against this being strictly hemorrhoidal.  Stuttering diverticular bleed still remains a possibility. Will continue full liquids for now. Of note, her last colonoscopy was April 2011, with removal of a small adenomatous polyp.    2. CAD / aortic stenosis   LOS: 2 days   Tye Savoy  05/31/2013, 9:41 AM  GI Attending Note  I have personally taken an interval history, reviewed the chart, and examined the patient.  I agree with the extender's note, impression and recommendations.  Sandy Salaam. Deatra Ina, MD, [facility] [phone]

## 2013-05-31 NOTE — Progress Notes (Signed)
[facility] PROGRESS NOTE  [name] VWU:[id number] DOB: 12-Jun-1935 DOA: 05/29/2013 PCP: Megan Gainer, MD  Assessment/Plan:  Rectal bleeding  Possibly diverticular bleed vs internal hemorrhoids. however given nature of bleed including dark stools and being on NSAIDs cannot r/o UGI bleed.  -PO PPI twice a day. Avoid NSAIDs. Will hold aspirin.  -Patient seen by Dr. Carlean [name] previously.  -Continue full liquids    Anemia  -Secondary to GI bleed   Near syncope  Likely secondary to blood loss anemia. . No EKG changes. Monitor on telemetry. 2 sets of troponin negative.   Hypertension  Resume home medications   GERD  -PO Protonix 40mg  BID   Aortic stenosis, moderate  Follows with Dr [name]  Fibromyalgia -Counseled patient she could not use NSAIDs again  -Start on tramadol -Continue Neurontin    Code Status: Full Family Communication: None Disposition Plan: Discharge in a.m. if hemoglobin stable   Consultants: Dr [name](GI)    Procedures: CT abdomen pelvis with contrast 05/29/2013 1. No acute inflammatory process within abdomen or pelvis. Scattered  diverticula are noted left colon. Multiple sigmoid colon  diverticula. No evidence of acute diverticulitis.  2. Status post hysterectomy.  3. Atherosclerotic calcifications of abdominal aorta and iliac  arteries.  4. No pericecal inflammation. No small bowel obstruction.    Antibiotics:     HPI/Subjective: 78yo WF PMHx fibromyalgia, NSAID overuse, hypertension, hyperlipidemia, moderate aortic stenosis, GERD, history of diverticulosis and internal hemorrhoids (colonoscopy in 2011 by Dr. Carlean [name]) presented to the ED with rectal bleed since 2 days. Patient noticed dark stool occasionally mixed with bright blood when cleaning herself after bowel movement. She reports having 3 bowel movements on the first day ,1 BM yesterday and about 4-5 BM today. She reports that with every bowel movement she has had dark  stools occasionally mixed with bright blood. She denies any pain associated with bowel movements. This morning she started having some cramps over her right lower quadrant and also felt dizzy and diaphoretic take with near syncopal symptoms. She did have some blurred vision as well. She reports that she had not eaten anything since this morning. She also informs taking ibuprofen 400 mg about 2-3 tablets every day for her fibromyalgia symptoms. She has not taken any and states on her aspirin since the last 2 days. She denies any epigastric pain or hematemesis. As noted above she had a colonoscopy done in 2011 which showed diverticulosis and internal hemorrhoids. 2/17 patient sitting comfortably in bed negative N./V., states had black BM earlier.  2/18 on black bm today. rlq pain.   Objective: Filed Vitals:   05/30/13 2047 05/31/13 0538 05/31/13 1029 05/31/13 1357  BP: 126/50 135/56 140/60 119/81  Pulse: 73 67 68 63  Temp: 99 F (37.2 C) 98 F (36.7 C)  97.7 F (36.5 C)  TempSrc: Oral Oral  Oral  Resp: 16 16  16   Height:      Weight:      SpO2: 98% 98%  100%    Intake/Output Summary (Last 24 hours) at 05/31/13 1601 Last data filed at 05/31/13 0930  Gross per 24 hour  Intake 1650.83 ml  Output    501 ml  Net 1149.83 ml   Filed Weights   05/29/13 1602 05/30/13 0025  Weight: 73.483 kg (162 lb) 69.4 kg (153 lb)    Exam:   General:  A./O. x4, NAD  Cardiovascular: Regular rhythm and rate, negative murmurs rubs gallops, DP/PT pulse plus one  Respiratory: Clear  to auscultation bilateral  Abdomen: Right lower quadrant/left lower quadrant tenderness to palpation  Musculoskeletal: Negative pedal edema   Data Reviewed: Basic Metabolic Panel:  Recent Labs Lab 05/29/13 1645  NA 139  K 4.5  CL 102  CO2 25  GLUCOSE 94  BUN 13  CREATININE 0.66  CALCIUM 9.5   Liver Function Tests:  Recent Labs Lab 05/29/13 1645  AST 19  ALT 16  ALKPHOS 75  BILITOT 0.5  PROT 6.9   ALBUMIN 3.6   No results found for this basename: LIPASE, AMYLASE,  in the last 168 hours No results found for this basename: AMMONIA,  in the last 168 hours CBC:  Recent Labs Lab 05/29/13 1645  05/30/13 1329 05/30/13 2038 05/31/13 0200 05/31/13 0813 05/31/13 1452  WBC 8.4  < > 5.4 5.5 5.2 4.5 5.2  NEUTROABS 5.9  --   --   --   --   --   --   HGB 11.6*  < > 10.5* 10.8* 10.5* 11.0* 11.1*  HCT 35.8*  < > 31.6* 32.5* 33.4* 34.7* 34.2*  MCV 88.6  < > 89.0 89.5 90.3 90.6 89.1  PLT 237  < > 208 217 201 199 216  < > = values in this interval not displayed. Cardiac Enzymes:  Recent Labs Lab 05/29/13 1645 05/29/13 2015  TROPONINI <0.30 <0.30   BNP (last 3 results) No results found for this basename: PROBNP,  in the last 8760 hours CBG:  Recent Labs Lab 05/29/13 1433  GLUCAP 94    No results found for this or any previous visit (from the past 240 hour(s)).   Studies: Ct Abdomen Pelvis W Contrast  05/29/2013   CLINICAL DATA:  Dizziness, abdominal pain, rectal bleeding  EXAM: CT ABDOMEN AND PELVIS WITH CONTRAST  TECHNIQUE: Multidetector CT imaging of the abdomen and pelvis was performed using the standard protocol following bolus administration of intravenous contrast.  CONTRAST:  39mL OMNIPAQUE IOHEXOL 300 MG/ML SOLN, 183mL OMNIPAQUE IOHEXOL 300 MG/ML SOLN  COMPARISON:  02/23/2008  FINDINGS: Lung bases are unremarkable. Sagittal images of the spine shows osteopenia and degenerative changes lumbar spine. Liver, pancreas, spleen and adrenals are unremarkable. Kidneys are symmetrical in size and enhancement. No hydronephrosis or hydroureter. A cyst in left kidney measures 1 cm stable from prior exam.  Extensive atherosclerotic calcifications of abdominal aorta and iliac arteries. No aortic aneurysm.  Delayed renal images shows bilateral renal symmetrical excretion. Bilateral visualized proximal ureter is unremarkable.  There is no small bowel obstruction. No ascites or free air. No  adenopathy. There is no pericecal inflammation. Multiple sigmoid colon diverticula are noted. No evidence of acute diverticulitis. Few diverticula are noted left colon. No evidence of acute diverticulitis. The urinary bladder is unremarkable. The patient is status post hysterectomy. No destructive bony lesions are noted within pelvis.  IMPRESSION: 1. No acute inflammatory process within abdomen or pelvis. Scattered diverticula are noted left colon. Multiple sigmoid colon diverticula. No evidence of acute diverticulitis. 2. Status post hysterectomy. 3. Atherosclerotic calcifications of abdominal aorta and iliac arteries. 4. No pericecal inflammation.  No small bowel obstruction.   Electronically Signed   By: Lahoma Crocker M.D.   On: 05/29/2013 19:59    Scheduled Meds: . atorvastatin  40 mg Oral q1800  . calcium-vitamin D  1 tablet Oral QHS  . cycloSPORINE  1 drop Both Eyes BID  . gabapentin  600 mg Oral TID  . hydrocortisone  25 mg Rectal BID  . metoprolol  50 mg  Oral BID  . pantoprazole  40 mg Oral BID  . sodium chloride  3 mL Intravenous Q12H   Continuous Infusions: . sodium chloride 50 mL/hr at 05/30/13 2216    Principal Problem:   Rectal bleeding Active Problems:   ANEMIA-UNSPECIFIED   HYPERTENSION, UNSPECIFIED   HEMORRHOIDS, INTERNAL   GERD   DIVERTICULOSIS-COLON   FIBROMYALGIA   Aortic stenosis, moderate   Near syncope   GI bleed   NSAID long-term use    Time spent:12min   Belita Warsame  [facility] Pager [phone]. If 7PM-7AM, please contact night-coverage at [email or web address], password [facility] 05/31/2013, 4:01 PM  LOS: 2 days

## 2013-06-01 ENCOUNTER — Encounter (HOSPITAL_COMMUNITY): Payer: Self-pay | Admitting: Gastroenterology

## 2013-06-01 ENCOUNTER — Encounter (HOSPITAL_COMMUNITY)
Admission: EM | Disposition: A | Discharge status: Home / Self Care | Payer: Self-pay | Source: Home / Self Care | Attending: Internal Medicine

## 2013-06-01 HISTORY — PX: FLEXIBLE SIGMOIDOSCOPY: SHX5431

## 2013-06-01 LAB — CBC
HEMATOCRIT: 32.8 % — AB (ref 36.0–46.0)
Hemoglobin: 10.4 g/dL — ABNORMAL LOW (ref 12.0–15.0)
MCH: 28.8 pg (ref 26.0–34.0)
MCHC: 31.7 g/dL (ref 30.0–36.0)
MCV: 90.9 fL (ref 78.0–100.0)
Platelets: 203 10*3/uL (ref 150–400)
RBC: 3.61 MIL/uL — ABNORMAL LOW (ref 3.87–5.11)
RDW: 13.3 % (ref 11.5–15.5)
WBC: 6.5 10*3/uL (ref 4.0–10.5)

## 2013-06-01 SURGERY — SIGMOIDOSCOPY, FLEXIBLE
Anesthesia: Moderate Sedation

## 2013-06-01 MED ORDER — FLEET ENEMA 7-19 GM/118ML RE ENEM
1.0000 | ENEMA | Freq: Once | RECTAL | Status: AC
  Administered 2013-06-01: 1 via RECTAL
  Filled 2013-06-01: qty 1

## 2013-06-01 MED ORDER — FENTANYL CITRATE 0.05 MG/ML IJ SOLN
INTRAMUSCULAR | Status: DC | PRN
  Administered 2013-06-01 (×2): 25 ug via INTRAVENOUS

## 2013-06-01 MED ORDER — FENTANYL CITRATE 0.05 MG/ML IJ SOLN
INTRAMUSCULAR | Status: AC
  Filled 2013-06-01: qty 2

## 2013-06-01 MED ORDER — MIDAZOLAM HCL 10 MG/2ML IJ SOLN
INTRAMUSCULAR | Status: AC
  Filled 2013-06-01: qty 2

## 2013-06-01 MED ORDER — MIDAZOLAM HCL 10 MG/2ML IJ SOLN
INTRAMUSCULAR | Status: DC | PRN
  Administered 2013-06-01: 2 mg via INTRAVENOUS
  Administered 2013-06-01: 1 mg via INTRAVENOUS
  Administered 2013-06-01: 2 mg via INTRAVENOUS
  Administered 2013-06-01: 1 mg via INTRAVENOUS

## 2013-06-01 NOTE — Progress Notes (Signed)
    Progress Note   Subjective  lower abdominal discomfort last night. Had a stool with small amount of blood last night. She passes small amounts of blood with flatus.    Objective   Vital signs in last 24 hours: Temp:  [97.7 F (36.5 C)-98.5 F (36.9 C)] 98 F (36.7 C) (02/19 0853) Pulse Rate:  [59-73] 59 (02/19 0853) Resp:  [16-19] 19 (02/19 0853) BP: (117-140)/(49-81) 123/57 mmHg (02/19 0853) SpO2:  [95 %-100 %] 98 % (02/19 0442) Last BM Date: 05/31/13 General:    white female in NAD Heart:  Regular rate and rhythm; no murmurs Lungs: Respirations even and unlabored, lungs CTA bilaterally Abdomen:  Soft, mild lower abdominal tenderness. Nondistended. Normal bowel sounds. Extremities:  Without edema. Neurologic:  Alert and oriented,  grossly normal neurologically. Psych:  Cooperative. Normal mood and affect.    Lab Results:  Recent Labs  05/31/13 0813 05/31/13 1452 06/01/13 0340  WBC 4.5 5.2 6.5  HGB 11.0* 11.1* 10.4*  HCT 34.7* 34.2* 32.8*  PLT 199 216 203   BMET  Recent Labs  05/29/13 1645  NA 139  K 4.5  CL 102  CO2 25  GLUCOSE 94  BUN 13  CREATININE 0.66  CALCIUM 9.5   LFT  Recent Labs  05/29/13 1645  PROT 6.9  ALBUMIN 3.6  AST 19  ALT 16  ALKPHOS 75  BILITOT 0.5   PT/INR  Recent Labs  05/29/13 1645  LABPROT 13.1  INR 1.01     Assessment / Plan:    1. GI bleed. Although patient was taking NSAIDs, Minimal drop in hgb and normal BUN speak against upper GI bleed. Her hgb is fluctuating between mid 10 to mid 11 range.  Still passing small amount of blood with BM and flatus. She does have some hemorrhoids, I started steroid suppositories yesterday. Bleeding most likely a stuttering diverticular bleed vrs hemorrhoidal bleed. Of note, her last colonoscopy was April 2011, with removal of a small adenomatous polyp. Will plan for flexible sigmoidoscopy today for further evaluation   2. CAD / aortic stenosis    LOS: 3 days   [name]  06/01/2013, 9:13 AM  GI Attending Note  I have personally taken an interval history, reviewed the chart, and examined the patient.  I agree with the extender's note, impression and recommendations.  Sandy Salaam. Deatra Ina, MD, [facility] [phone]

## 2013-06-01 NOTE — Interval H&P Note (Signed)
History and Physical Interval Note:  06/01/2013 3:50 PM  [name]  has presented today for surgery, with the diagnosis of rectal bleeding  The various methods of treatment have been discussed with the patient and family. After consideration of risks, benefits and other options for treatment, the patient has consented to  Procedure(s) with comments: FLEXIBLE SIGMOIDOSCOPY (N/A) - may need hemorrhoidal banding as a surgical intervention .  The patient's history has been reviewed, patient examined, no change in status, stable for surgery.  I have reviewed the patient's chart and labs.  Questions were answered to the patient's satisfaction.     The recent H&P (dated **06/01/13*) was reviewed, the patient was examined and there is no change in the patients condition since that H&P was completed.   Erskine Emery  06/01/2013, 3:50 PM   Erskine Emery

## 2013-06-01 NOTE — Progress Notes (Signed)
[facility] PROGRESS NOTE  [name] PTW:[id number] DOB: 1936-02-06 DOA: 05/29/2013 PCP: [name], MD  Assessment/Plan:  Rectal bleeding  Possibly diverticular bleed vs internal hemorrhoids. however given nature of bleed including dark stools and being on NSAIDs cannot r/o UGI bleed.  -PO PPI twice a day. Avoid NSAIDs. Will hold aspirin.  -Patient seen by [name] previously.  -Continue full liquids   - plan for flex sigmoidoscopy today.   Anemia  -Secondary to GI bleed   Near syncope  Likely secondary to blood loss anemia. . No EKG changes. Monitor on telemetry. 2 sets of troponin negative.   Hypertension  Resume home medications   GERD  -PO Protonix 40mg  BID   Aortic stenosis, moderate  Follows with [name]  Fibromyalgia -Counseled patient she could not use NSAIDs again  -Start on tramadol -Continue Neurontin    Code Status: Full Family Communication: None Disposition Plan: Discharge in a.m. if hemoglobin stable   Consultants: [name](GI)    Procedures: CT abdomen pelvis with contrast 05/29/2013 1. No acute inflammatory process within abdomen or pelvis. Scattered  diverticula are noted left colon. Multiple sigmoid colon  diverticula. No evidence of acute diverticulitis.  2. Status post hysterectomy.  3. Atherosclerotic calcifications of abdominal aorta and iliac  arteries.  4. No pericecal inflammation. No small bowel obstruction.    Antibiotics:     HPI/Subjective: 78yo WF PMHx fibromyalgia, NSAID overuse, hypertension, hyperlipidemia, moderate aortic stenosis, GERD, history of diverticulosis and internal hemorrhoids (colonoscopy in 2011 by [name]) presented to the ED with rectal bleed since 2 days. Patient noticed dark stool occasionally mixed with bright blood when cleaning herself after bowel movement. She reports having 3 bowel movements on the first day ,1 BM yesterday and about 4-5 BM today. She reports that with  every bowel movement she has had dark stools occasionally mixed with bright blood. She denies any pain associated with bowel movements. This morning she started having some cramps over her right lower quadrant and also felt dizzy and diaphoretic take with near syncopal symptoms. She did have some blurred vision as well. She reports that she had not eaten anything since this morning. She also informs taking ibuprofen 400 mg about 2-3 tablets every day for her fibromyalgia symptoms. She has not taken any and states on her aspirin since the last 2 days. She denies any epigastric pain or hematemesis. As noted above she had a colonoscopy done in 2011 which showed diverticulosis and internal hemorrhoids. 2/17 patient sitting comfortably in bed negative N./V., states had black BM earlier.  2/18 on black bm today. rlq pain.   Objective: Filed Vitals:   06/01/13 1630 06/01/13 1640 06/01/13 1650 06/01/13 1713  BP: 107/49 107/49 114/49 116/80  Pulse:    68  Temp:    97.4 F (36.3 C)  TempSrc:    Oral  Resp: 8 13 15 16   Height:      Weight:      SpO2: 95% 94% 94% 98%    Intake/Output Summary (Last 24 hours) at 06/01/13 1808 Last data filed at 06/01/13 1300  Gross per 24 hour  Intake 863.33 ml  Output      0 ml  Net 863.33 ml   Filed Weights   05/29/13 1602 05/30/13 0025  Weight: 73.483 kg (162 lb) 69.4 kg (153 lb)    Exam:   General:  A./O. x4, NAD  Cardiovascular: Regular rhythm and rate, negative murmurs rubs gallops, DP/PT pulse plus one  Respiratory: Clear to auscultation bilateral  Abdomen: Right lower quadrant/left lower quadrant tenderness to palpation  Musculoskeletal: Negative pedal edema   Data Reviewed: Basic Metabolic Panel:  Recent Labs Lab 05/29/13 1645  NA 139  K 4.5  CL 102  CO2 25  GLUCOSE 94  BUN 13  CREATININE 0.66  CALCIUM 9.5   Liver Function Tests:  Recent Labs Lab 05/29/13 1645  AST 19  ALT 16  ALKPHOS 75  BILITOT 0.5  PROT 6.9  ALBUMIN  3.6   No results found for this basename: LIPASE, AMYLASE,  in the last 168 hours No results found for this basename: AMMONIA,  in the last 168 hours CBC:  Recent Labs Lab 05/29/13 1645  05/30/13 2038 05/31/13 0200 05/31/13 0813 05/31/13 1452 06/01/13 0340  WBC 8.4  < > 5.5 5.2 4.5 5.2 6.5  NEUTROABS 5.9  --   --   --   --   --   --   HGB 11.6*  < > 10.8* 10.5* 11.0* 11.1* 10.4*  HCT 35.8*  < > 32.5* 33.4* 34.7* 34.2* 32.8*  MCV 88.6  < > 89.5 90.3 90.6 89.1 90.9  PLT 237  < > 217 201 199 216 203  < > = values in this interval not displayed. Cardiac Enzymes:  Recent Labs Lab 05/29/13 1645 05/29/13 2015  TROPONINI <0.30 <0.30   BNP (last 3 results) No results found for this basename: PROBNP,  in the last 8760 hours CBG:  Recent Labs Lab 05/29/13 1433  GLUCAP 94    No results found for this or any previous visit (from the past 240 hour(s)).   Studies: No results found.  Scheduled Meds: . atorvastatin  40 mg Oral q1800  . calcium-vitamin D  1 tablet Oral QHS  . cycloSPORINE  1 drop Both Eyes BID  . gabapentin  600 mg Oral TID  . hydrocortisone  25 mg Rectal BID  . metoprolol  50 mg Oral BID  . pantoprazole  40 mg Oral BID  . sodium chloride  3 mL Intravenous Q12H   Continuous Infusions: . sodium chloride 500 mL (06/01/13 1307)    Principal Problem:   Rectal bleeding Active Problems:   ANEMIA-UNSPECIFIED   HYPERTENSION, UNSPECIFIED   HEMORRHOIDS, INTERNAL   GERD   DIVERTICULOSIS-COLON   FIBROMYALGIA   Aortic stenosis, moderate   Near syncope   GI bleed   NSAID long-term use    Time spent:19min   [name]  [facility] Pager [phone]. If 7PM-7AM, please contact night-coverage at [email or web address], password [facility] 06/01/2013, 6:08 PM  LOS: 3 days

## 2013-06-01 NOTE — Evaluation (Signed)
Physical Therapy Evaluation Patient Details Name: [name] MRN: [id number] DOB: 1935-07-05 Today's Date: 06/01/2013 Time: 8657-8469 PT Time Calculation (min): 23 min  PT Assessment / Plan / Recommendation History of Present Illness  78 yo female admittdwith rectal bleeding, lower quadrant pain, near syncope. Hx of HTN, aortic stenosis, GERD, fibromyalgia, cervical fusion  Clinical Impression  On eval, pt required supervision level assist for mobility-able to ambulate ~400 feet without assistive device. Slightly unsteady at times-pt did reach out for handrail to hold onto intermittently. No LOB during session. Recommend HHPT for home environment assessment and possibly balance training?. Discussed fall history-pt stated there was always an external cause for her falls -i.e. Tripping over objects, slippery surfaces (outside, bathroom), shoe catching on surface. Pt denied dizziness/lightheadedness during session. Cautioned pt to be more aware of environment and to exercise more caution in general.     PT Assessment  Patient needs continued PT services    Follow Up Recommendations  Home health PT (for home assessment and possibly balance training)    Does the patient have the potential to tolerate intense rehabilitation      Barriers to Discharge        Equipment Recommendations  None recommended by PT    Recommendations for Other Services     Frequency Min 3X/week    Precautions / Restrictions Precautions Precautions: Fall Restrictions Weight Bearing Restrictions: No   Pertinent Vitals/Pain 3/10 L lower abdominal area      Mobility  Bed Mobility Overal bed mobility: Independent Transfers Overall transfer level: Independent Ambulation/Gait Ambulation/Gait assistance: Supervision Ambulation Distance (Feet): 400 Feet Assistive device: None (intermittently used handrail) General Gait Details: slightly unsteady but no LOB. good gait speed.     Exercises     PT  Diagnosis: Difficulty walking  PT Problem List: Decreased mobility;Decreased balance PT Treatment Interventions: Gait training;Stair training;Functional mobility training;Therapeutic activities;Therapeutic exercise;Balance training;Patient/family education     PT Goals(Current goals can be found in the care plan section) Acute Rehab PT Goals Patient Stated Goal: home PT Goal Formulation: With patient Time For Goal Achievement: 06/08/13 Potential to Achieve Goals: Good  Visit Information  Last PT Received On: 06/01/13 Assistance Needed: +1 History of Present Illness: 78 yo female admittdwith rectal bleeding, lower quadrant pain, near syncope. Hx of HTN, aortic stenosis, GERD, fibromyalgia, cervical fusion       Prior [place] expects to be discharged to:: Private residence Living Arrangements: Spouse/significant other Type of Home: House Home Access: Stairs to enter [employer] of Steps: 3 Home Layout: Two level;Able to live on main level with bedroom/bathroom Home Equipment: Kasandra Knudsen - single point Prior Function Level of Independence: Independent Communication Communication: No difficulties    Cognition  Cognition Arousal/Alertness: Awake/alert Behavior During Therapy: WFL for tasks assessed/performed Overall Cognitive Status: Within Functional Limits for tasks assessed    Extremity/Trunk Assessment Upper Extremity Assessment Upper Extremity Assessment: Overall WFL for tasks assessed Lower Extremity Assessment Lower Extremity Assessment: Overall WFL for tasks assessed Cervical / Trunk Assessment Cervical / Trunk Assessment: Normal   Balance Balance Overall balance assessment: History of Falls;Needs assistance Sitting-balance support: No upper extremity supported Sitting balance-Leahy Scale: Normal Standing balance support: No upper extremity supported;During functional activity Standing balance-Leahy Scale: Good  End of Session  PT - End of Session Activity Tolerance: Patient tolerated treatment well Patient left: in chair;with call bell/phone within reach;with nursing/sitter in room  GP Functional Assessment Tool Used: clinical judgment Functional Limitation: Mobility: Walking and moving around  Mobility: Walking and Moving Around Current Status [phone]): At least 1 percent but less than 20 percent impaired, limited or restricted Mobility: Walking and Moving Around Goal Status [phone]): 0 percent impaired, limited or restricted   Weston Anna, MPT Pager: [phone]

## 2013-06-01 NOTE — Op Note (Signed)
[facility] [place], [address]   FLEXIBLE SIGMOIDOSCOPY PROCEDURE REPORT  PATIENT: [name], [name]  MR#: [id number] BIRTHDATE: Jun 14, 1935 , 77  yrs. old GENDER: Female ENDOSCOPIST: Inda Castle, MD REFERRED BY: PROCEDURE DATE:  06/01/2013 PROCEDURE:   Hemorrhoidectomy via banding, clips or ligation ASA CLASS:   Class II INDICATIONS:rectal bleeding. MEDICATIONS: These medications were titrated to patient response per physician's verbal order, Fentanyl 50 mcg IV, and Versed-Detailed 5 mg IV  DESCRIPTION OF PROCEDURE:   After the risks benefits and alternatives of the procedure were thoroughly explained, informed consent was obtained.  revealed no abnormalities of the rectum. The endoscope was introduced through the anus  and advanced to the descending colon , limited by No adverse events experienced.   The quality of the prep was    .  The instrument was then slowly withdrawn as the mucosa was fully examined.         COLON FINDINGS: Moderate diverticulosis was noted in the sigmoid colon and proximal descending colon.  No blood was present.  Large internal hemorrhoids were seen, particularly the right posterior hemorrhoidal bundle.  A single band was placed just above the dentate line encompassing the right posterior bundle.  The rectum was otherwise unremarkable. Retroflexed views revealed no abnormalities.    The scope was then withdrawn from the patient and the procedure terminated.  COMPLICATIONS: There were no complications.  ENDOSCOPIC IMPRESSION: Moderate diverticulosis was noted in the sigmoid colon and proximal descending colon internal hemorrhoids  Source for rectal bleeding is uncertain.  Possibilities include hemorrhoidal bleeding or bleeding from a diverticulum.  A single band was placed over the right posterior hemorrhoidal bundle.  RECOMMENDATIONS: complete band ligation of internal hemorrhoids over the next few weeks  as outpatient  REPEAT EXAM: outpatient ligation of internal hemorrhoids   in 2-3 weeks  _______________________________ eSigned:  Inda Castle, MD 06/01/2013 4:13 PM   CC:

## 2013-06-01 NOTE — Progress Notes (Signed)
Sigmoidoscopy demonstrated large internal hemorrhoids and sigmoid diverticulosis.  There was no fresh or old blood.  On the presumption that she bled from hemorrhoids I placed a single band.  I will complete band ligation as an outpatient. Okay for discharge today or tomorrow.

## 2013-06-01 NOTE — H&P (View-Only) (Signed)
  Progress Note   Subjective  No bleeding through the night but had a stool with blood this am. Blood described.   Objective   Vital signs in last 24 hours: Temp:  [98 F (36.7 C)-99 F (37.2 C)] 98 F (36.7 C) (02/18 0538) Pulse Rate:  [67-78] 67 (02/18 0538) Resp:  [14-16] 16 (02/18 0538) BP: (126-135)/(50-67) 135/56 mmHg (02/18 0538) SpO2:  [98 %-100 %] 98 % (02/18 0538) Last BM Date: 05/29/13 General:    white female in NAD Heart:  Regular rate and rhythm Abdomen:  Soft, nontender and nondistended. Normal bowel sounds. Extremities:  Without edema. Neurologic:  Alert and oriented,  grossly normal neurologically. Psych:  Cooperative. Normal mood and affect.    Lab Results:  Recent Labs  05/30/13 2038 05/31/13 0200 05/31/13 0813  WBC 5.5 5.2 4.5  HGB 10.8* 10.5* 11.0*  HCT 32.5* 33.4* 34.7*  PLT 217 201 199   BMET  Recent Labs  05/29/13 1645  NA 139  K 4.5  CL 102  CO2 25  GLUCOSE 94  BUN 13  CREATININE 0.66  CALCIUM 9.5   LFT  Recent Labs  05/29/13 1645  PROT 6.9  ALBUMIN 3.6  AST 19  ALT 16  ALKPHOS 75  BILITOT 0.5    Studies/Results: Ct Abdomen Pelvis W Contrast  05/29/2013   CLINICAL DATA:  Dizziness, abdominal pain, rectal bleeding  EXAM: CT ABDOMEN AND PELVIS WITH CONTRAST  TECHNIQUE: Multidetector CT imaging of the abdomen and pelvis was performed using the standard protocol following bolus administration of intravenous contrast.  CONTRAST:  25mL OMNIPAQUE IOHEXOL 300 MG/ML SOLN, 100mL OMNIPAQUE IOHEXOL 300 MG/ML SOLN  COMPARISON:  02/23/2008  FINDINGS: Lung bases are unremarkable. Sagittal images of the spine shows osteopenia and degenerative changes lumbar spine. Liver, pancreas, spleen and adrenals are unremarkable. Kidneys are symmetrical in size and enhancement. No hydronephrosis or hydroureter. A cyst in left kidney measures 1 cm stable from prior exam.  Extensive atherosclerotic calcifications of abdominal aorta and iliac arteries.  No aortic aneurysm.  Delayed renal images shows bilateral renal symmetrical excretion. Bilateral visualized proximal ureter is unremarkable.  There is no small bowel obstruction. No ascites or free air. No adenopathy. There is no pericecal inflammation. Multiple sigmoid colon diverticula are noted. No evidence of acute diverticulitis. Few diverticula are noted left colon. No evidence of acute diverticulitis. The urinary bladder is unremarkable. The patient is status post hysterectomy. No destructive bony lesions are noted within pelvis.  IMPRESSION: 1. No acute inflammatory process within abdomen or pelvis. Scattered diverticula are noted left colon. Multiple sigmoid colon diverticula. No evidence of acute diverticulitis. 2. Status post hysterectomy. 3. Atherosclerotic calcifications of abdominal aorta and iliac arteries. 4. No pericecal inflammation.  No small bowel obstruction.   Electronically Signed   By: Liviu  Pop M.D.   On: 05/29/2013 19:59     Assessment / Plan:   1. GI bleed. Although patient is taking NSAIDs normal BUN and relatively minimal drop in hemoglobin speaks against upper GI bleed though this isn't total excluded. Her hgb is actually higher today (11.0). She does have some hemorrhoids on exam though the darker colored blood speaks against this being strictly hemorrhoidal.  Stuttering diverticular bleed still remains a possibility. Will continue full liquids for now. Of note, her last colonoscopy was April 2011, with removal of a small adenomatous polyp.    2. CAD / aortic stenosis   LOS: 2 days   Paula Guenther  05/31/2013, 9:41 AM    GI Attending Note  I have personally taken an interval history, reviewed the chart, and examined the patient.  I agree with the extender's note, impression and recommendations.  Sandy Salaam. Deatra Ina, MD, [facility] [phone]

## 2013-06-01 NOTE — Progress Notes (Signed)
Patient back from Endo, alert and oriented, denies any pain/distress. In bed resting, will continue to monitor patient.

## 2013-06-02 ENCOUNTER — Encounter: Payer: Self-pay | Admitting: Nurse Practitioner

## 2013-06-02 ENCOUNTER — Encounter (HOSPITAL_COMMUNITY): Payer: Self-pay | Admitting: Gastroenterology

## 2013-06-02 DIAGNOSIS — K648 Other hemorrhoids: Secondary | ICD-10-CM

## 2013-06-02 MED ORDER — HYDROCORTISONE ACETATE 25 MG RE SUPP: 25.0000 mg | Freq: Two times a day (BID) | RECTAL | Status: DC

## 2013-06-02 NOTE — Progress Notes (Signed)
Pt states that she did not want HHPT, that when she is at home she walks just fine.

## 2013-06-02 NOTE — ED Provider Notes (Signed)
Medical screening examination/treatment/procedure(s) were conducted as a shared visit with non-physician practitioner(s) and myself.  I personally evaluated the patient during the encounter.    Pt examined.  Stable hemodynamics.  Benign abdomen.  No peritoneal irritation.  Admitted to medicine.  GI consultation.  Tanna Furry, MD 06/02/13 [phone]

## 2013-06-02 NOTE — Progress Notes (Signed)
    Progress Note   Subjective  feels much better. No further bleeding   Objective   Vital signs in last 24 hours: Temp:  [97.4 F (36.3 C)-98.5 F (36.9 C)] 98.1 F (36.7 C) (02/20 0444) Pulse Rate:  [60-81] 77 (02/20 0444) Resp:  [8-23] 16 (02/20 0444) BP: (99-143)/(40-80) 115/75 mmHg (02/20 0444) SpO2:  [94 %-100 %] 99 % (02/20 0444) Last BM Date: 06/01/13 General:    white female in NAD Abdomen:  Soft, nontender and nondistended. Normal bowel sounds. Extremities:  Without edema. Neurologic:  Alert and oriented,  grossly normal neurologically. Psych:  Cooperative. Normal mood and affect.  Lab Results:  Recent Labs  05/31/13 0813 05/31/13 1452 06/01/13 0340  WBC 4.5 5.2 6.5  HGB 11.0* 11.1* 10.4*  HCT 34.7* 34.2* 32.8*  PLT 199 216 203   Flex sigmoidoscopy 06/02/13 COLON FINDINGS: Moderate diverticulosis was noted in the sigmoid  colon and proximal descending colon. No blood was present. Large  internal hemorrhoids were seen, particularly the right posterior  hemorrhoidal bundle. A single band was placed just above the  dentate line encompassing the right posterior bundle. The rectum  was otherwise unremarkable.  Retroflexed views revealed no abnormalities. The scope was then  withdrawn from the patient and the procedure terminated.  COMPLICATIONS:There were no complications.  ENDOSCOPIC IMPRESSION:  Moderate diverticulosis was noted in the sigmoid colon and proximal  descending colon  internal hemorrhoids  Source for rectal bleeding is uncertain. Possibilities include  hemorrhoidal bleeding or bleeding from a diverticulum. A single  band was placed over the right posterior hemorrhoidal bundle.  RECOMMENDATIONS:  complete band ligation of internal hemorrhoids over the next few  weeks as outpatient  REPEAT EXAM:  outpatient ligation of internal hemorrhoids in 2-3 weeks     Assessment / Plan:   1. GI bleed. Flex sigmoid yesterday with banding of a large  hemorrhoid (see above). Bleeding may have been hemorrhoidal but diverticular not totally excluded. Although patient was taking NSAIDs, minimal drop in hgb and normal BUN speak against upper GI bleed. She is stable for discharge from GI standpoint. Advised her to take daily Miralax to avoid constipation.   2. Mild anemia, stable. Her hgb has fluctuated between mid 10 to mid 11 range with baseline hgb of 13.    3. Internal hemorrhoids, s/p banding yesterday. Will probably need additional treatment as outpatient. Follow up office appointment made with me for 06/09/13.     LOS: 4 days   Tye Savoy  06/02/2013, 9:08 AM  GI Attending Note  I have personally taken an interval history, reviewed the chart, and examined the patient.  I agree with the extender's note, impression and recommendations.  She should complete hemorrhoidal banding as outpt.  Sandy Salaam. Deatra Ina, MD, [facility] [phone]

## 2013-06-02 NOTE — Discharge Summary (Signed)
Physician Discharge Summary  [name] XTG:[id number] DOB: February 06, 1936 DOA: 05/29/2013  PCP: [name], MD  Admit date: 05/29/2013 Discharge date: 06/02/2013  Time spent: 30 minutes  Recommendations for Outpatient Follow-up:  1. Follow up with PCP in one week 2. Follow up with GI as recommended.   Discharge Diagnoses:  Principal Problem:   Rectal bleeding Active Problems:   ANEMIA-UNSPECIFIED   HYPERTENSION, UNSPECIFIED   HEMORRHOIDS, INTERNAL   GERD   DIVERTICULOSIS-COLON   FIBROMYALGIA   Aortic stenosis, moderate   Near syncope   GI bleed   NSAID long-term use   Discharge Condition: improved  Diet recommendation: regular diet  Filed Weights   05/29/13 1602 05/30/13 0025  Weight: 73.483 kg (162 lb) 69.4 kg (153 lb)    History of present illness:  78 year old female with history of hypertension, hyperlipidemia, moderate aortic stenosis, GERD, history of diverticulosis and internal hemorrhoids (colonoscopy in 2011 by [name]) presented to the ED with rectal bleed since 2 days. Patient noticed dark stool occasionally mixed with bright blood when cleaning herself after bowel movement. She was evaluated for possible internal hemorrhoids and diverticulosis.   Hospital Course:  Rectal bleeding  Possibly diverticular bleed vs internal hemorrhoids. however given nature of bleed including dark stools and being on NSAIDs, consulted GI.   - she underwent flex sigmoidoscopy and was found to have moderate diverticulosis and internal hemorrhoids. She underwnt a single band over the right posterior hemorrhoidal bundle. Plan to complete band ligation of the internal hemorrhoids over the next few weeks as outpatient.  Anemia  -Secondary to GI bleed  Near syncope   No EKG changes.  2 sets of troponin negative. Orthostatics negative.  Hypertension  Resume home medications  GERD  -PO Protonix 40mg  BID  Aortic stenosis, moderate  Follows with [name]  Fibromyalgia   -Counseled patient she could not use NSAIDs again. -Continue Neurontin   Procedures: CT abdomen pelvis with contrast 05/29/2013  1. No acute inflammatory process within abdomen or pelvis. Scattered  diverticula are noted left colon. Multiple sigmoid colon  diverticula. No evidence of acute diverticulitis.  2. Status post hysterectomy.  3. Atherosclerotic calcifications of abdominal aorta and iliac  arteries.  4. No pericecal inflammation. No small bowel obstruction.   Consultations: [name](GI)   Discharge Exam: Filed Vitals:   06/02/13 0444  BP: 115/75  Pulse: 77  Temp: 98.1 F (36.7 C)  Resp: 16    General: alert afebrile comfortable Cardiovascular: s1s2 Respiratory: ctab  Discharge Instructions  Discharge Orders   Future Appointments Provider Department Dept Phone   06/09/2013 2:00 PM Willia Craze, NP [facility] [phone]   Future Orders Complete By Expires   Call MD for:  redness, tenderness, or signs of infection (pain, swelling, redness, odor or green/yellow discharge around incision site)  As directed    Call MD for:  temperature >100.4  As directed    Discharge instructions  As directed    Comments:     Follow up with PCP in one week Follow up with gastroenterology as recommended.       Medication List         aspirin 81 MG tablet  Take 81 mg by mouth daily.     atorvastatin 40 MG tablet  Commonly known as:  LIPITOR  Take 1 tablet (40 mg total) by mouth daily at 6 PM.     Calcium-Vitamin D 600-125 MG-UNIT Tabs  Take 1 tablet by mouth at bedtime.  clobetasol cream 0.05 %  Commonly known as:  TEMOVATE  Apply 1 application topically 2 (two) times daily as needed (for rash on pelvic area).     COQ-10 PO  Take 1 tablet by mouth daily.     cycloSPORINE 0.05 % ophthalmic emulsion  Commonly known as:  RESTASIS  Place 1 drop into both eyes 2 (two) times daily.     gabapentin 600 MG tablet  Commonly  known as:  NEURONTIN  Take 1 tablet by mouth 3 (three) times daily.     hydrocortisone 25 MG suppository  Commonly known as:  ANUSOL-HC  Place 1 suppository (25 mg total) rectally 2 (two) times daily.     hydroxypropyl methylcellulose 2.5 % ophthalmic solution  Commonly known as:  ISOPTO TEARS  Place 2 drops into both eyes 3 (three) times daily as needed for dry eyes.     metoprolol 50 MG tablet  Commonly known as:  LOPRESSOR  Take 1 tablet (50 mg total) by mouth 2 (two) times daily.     omeprazole 20 MG capsule  Commonly known as:  PRILOSEC  Take 1 capsule (20 mg total) by mouth every evening.       Allergies  Allergen Reactions  . Latex Dermatitis and Rash    "with  extended exposure"  . Tape Other (See Comments)    Dermatitis rash "with extended exposure"       Follow-up Information   Follow up with [name], MD. Schedule an appointment as soon as possible for a visit in 1 week.   Specialty:  Family Medicine   Contact information:   [address] [place] [address] [phone]       Follow up with Erskine Emery, MD. Schedule an appointment as soon as possible for a visit in 2 weeks.   Specialty:  Gastroenterology   Contact information:   [address] [place] [address] [phone]        The results of significant diagnostics from this hospitalization (including imaging, microbiology, ancillary and laboratory) are listed below for reference.    Significant Diagnostic Studies: Ct Abdomen Pelvis W Contrast  05/29/2013   CLINICAL DATA:  Dizziness, abdominal pain, rectal bleeding  EXAM: CT ABDOMEN AND PELVIS WITH CONTRAST  TECHNIQUE: Multidetector CT imaging of the abdomen and pelvis was performed using the standard protocol following bolus administration of intravenous contrast.  CONTRAST:  44mL OMNIPAQUE IOHEXOL 300 MG/ML SOLN, 112mL OMNIPAQUE IOHEXOL 300 MG/ML SOLN  COMPARISON:  02/23/2008  FINDINGS: Lung bases are unremarkable. Sagittal  images of the spine shows osteopenia and degenerative changes lumbar spine. Liver, pancreas, spleen and adrenals are unremarkable. Kidneys are symmetrical in size and enhancement. No hydronephrosis or hydroureter. A cyst in left kidney measures 1 cm stable from prior exam.  Extensive atherosclerotic calcifications of abdominal aorta and iliac arteries. No aortic aneurysm.  Delayed renal images shows bilateral renal symmetrical excretion. Bilateral visualized proximal ureter is unremarkable.  There is no small bowel obstruction. No ascites or free air. No adenopathy. There is no pericecal inflammation. Multiple sigmoid colon diverticula are noted. No evidence of acute diverticulitis. Few diverticula are noted left colon. No evidence of acute diverticulitis. The urinary bladder is unremarkable. The patient is status post hysterectomy. No destructive bony lesions are noted within pelvis.  IMPRESSION: 1. No acute inflammatory process within abdomen or pelvis. Scattered diverticula are noted left colon. Multiple sigmoid colon diverticula. No evidence of acute diverticulitis. 2. Status post hysterectomy. 3. Atherosclerotic calcifications of  abdominal aorta and iliac arteries. 4. No pericecal inflammation.  No small bowel obstruction.   Electronically Signed   By: Lahoma Crocker M.D.   On: 05/29/2013 19:59    Microbiology: No results found for this or any previous visit (from the past 240 hour(s)).   Labs: Basic Metabolic Panel:  Recent Labs Lab 05/29/13 1645  NA 139  K 4.5  CL 102  CO2 25  GLUCOSE 94  BUN 13  CREATININE 0.66  CALCIUM 9.5   Liver Function Tests:  Recent Labs Lab 05/29/13 1645  AST 19  ALT 16  ALKPHOS 75  BILITOT 0.5  PROT 6.9  ALBUMIN 3.6   No results found for this basename: LIPASE, AMYLASE,  in the last 168 hours No results found for this basename: AMMONIA,  in the last 168 hours CBC:  Recent Labs Lab 05/29/13 1645  05/30/13 2038 05/31/13 0200 05/31/13 0813  05/31/13 1452 06/01/13 0340  WBC 8.4  < > 5.5 5.2 4.5 5.2 6.5  NEUTROABS 5.9  --   --   --   --   --   --   HGB 11.6*  < > 10.8* 10.5* 11.0* 11.1* 10.4*  HCT 35.8*  < > 32.5* 33.4* 34.7* 34.2* 32.8*  MCV 88.6  < > 89.5 90.3 90.6 89.1 90.9  PLT 237  < > 217 201 199 216 203  < > = values in this interval not displayed. Cardiac Enzymes:  Recent Labs Lab 05/29/13 1645 05/29/13 2015  TROPONINI <0.30 <0.30   BNP: BNP (last 3 results) No results found for this basename: PROBNP,  in the last 8760 hours CBG:  Recent Labs Lab 05/29/13 1433  GLUCAP 94       Signed:  Jorryn Hershberger  [facility] 06/02/2013, 10:41 AM

## 2013-06-02 NOTE — Progress Notes (Signed)
Pt left hospital at this time with her family at her side.  Alert, oriented, and without c/o. Discharge instructions given/explained with pt verbalizing understanding. Followup appointments noted.

## 2013-06-09 ENCOUNTER — Ambulatory Visit (INDEPENDENT_AMBULATORY_CARE_PROVIDER_SITE_OTHER): Payer: Medicare Other | Admitting: Nurse Practitioner

## 2013-06-09 ENCOUNTER — Encounter: Payer: Self-pay | Admitting: Nurse Practitioner

## 2013-06-09 ENCOUNTER — Other Ambulatory Visit (INDEPENDENT_AMBULATORY_CARE_PROVIDER_SITE_OTHER): Payer: Medicare Other

## 2013-06-09 VITALS — BP 138/70 | HR 72 | Ht 61.0 in | Wt 152.0 lb

## 2013-06-09 DIAGNOSIS — D649 Anemia, unspecified: Secondary | ICD-10-CM

## 2013-06-09 DIAGNOSIS — K922 Gastrointestinal hemorrhage, unspecified: Secondary | ICD-10-CM

## 2013-06-09 DIAGNOSIS — K5730 Diverticulosis of large intestine without perforation or abscess without bleeding: Secondary | ICD-10-CM

## 2013-06-09 DIAGNOSIS — K625 Hemorrhage of anus and rectum: Secondary | ICD-10-CM

## 2013-06-09 DIAGNOSIS — K5900 Constipation, unspecified: Secondary | ICD-10-CM

## 2013-06-09 LAB — HEMATOCRIT: HCT: 39.1 % (ref 36.0–46.0)

## 2013-06-09 LAB — HEMOGLOBIN: Hemoglobin: 12.5 g/dL (ref 12.0–15.0)

## 2013-06-09 MED ORDER — POLYETHYLENE GLYCOL 3350 17 GM/SCOOP PO POWD: 1.0000 | Freq: Once | ORAL | Status: DC

## 2013-06-09 MED ORDER — HYDROCORTISONE 2.5 % RE CREA: 1.0000 "application " | TOPICAL_CREAM | Freq: Two times a day (BID) | RECTAL | Status: DC

## 2013-06-09 NOTE — Patient Instructions (Signed)
Go to the basement for labs today Prescriptions sent to your pharmacy Purchase over the counter suppositories  Use the Miralax daily

## 2013-06-09 NOTE — Progress Notes (Signed)
     History of Present Illness:  Patient is a 78 year old female known to [name] for a history of adenomatous colon polyps. Her last surveillance colonoscopy was Oct 2011 with findings of moderate diverticulosis, internal hemorrhoids, melanosis and a diminutive adenomatous polyp. Patient was recently hospitalized with rectal bleeding. CTscan with contrast on admission was unremarkable. Following admission patient continued to pass small amounts of blood felt to be hemorrhoidal vrs diverticular. Patient underwent flexible sigmoidoscopy with findings of large hemorrhoids. A band was place on right posterior hemorrhoid.. Hgb fell approximately 2.5 grams from baseline, no transfusion was required. Bleeding had resolved by time of discharge. Since discharge patient has been having mild constipation despite stool softeners. No other GI complaints  Current Medications, Allergies, Past Medical History, Past Surgical History, Family History and Social History were reviewed in [employer] record.  Physical Exam: General: Pleasant, well developed , female in no acute distress Head: Normocephalic and atraumatic Eyes:  sclerae anicteric, conjunctiva pink  Ears: Normal auditory acuity Lungs: Clear throughout to auscultation Heart: Regular rate and rhythm Abdomen: Soft, non distended, non-tender. No masses, no hepatomegaly. Normal bowel sounds Rectal: In left sims there is mild edema just inside anal cfnal at about 5 o'clock. Brown stool in DRE. Tender on exam, anoscopy not done Musculoskeletal: Symmetrical with no gross deformities  Extremities: No edema  Neurological: Alert oriented x 4, grossly nonfocal Psychological:  Alert and cooperative. Normal mood and affect  Assessment and Recommendations:  1. Recent admission for rectal bleeding, likely hemorrhoidal in nature though diverticular bleed not excluded. Patient is s/p flex sig with banding of large hemorrhoid. No further  bleeding. Though other large internal hemorrhoids seen during flexible sigmoidoscopy patient prefers to hold off on additional banding. She feels well, bleeding resolved. She will call for recurrent bleeding.    2. Hx of adenomatous colon polyps, due for surveillance colonoscopy October 2016.   3. Fatigue, doubt related to anemia but will recheck hgb.   4. Constipation. Trial of daily Miralx .

## 2013-06-10 ENCOUNTER — Encounter: Payer: Self-pay | Admitting: Nurse Practitioner

## 2013-06-12 NOTE — Progress Notes (Signed)
Agree with Ms. Guenther's assessment and plan. Jahniah Pallas E. Karolyna Bianchini, MD, FACG   

## 2013-08-14 ENCOUNTER — Other Ambulatory Visit: Payer: Self-pay | Admitting: Nurse Practitioner

## 2013-08-15 ENCOUNTER — Other Ambulatory Visit: Payer: Self-pay | Admitting: *Deleted

## 2013-08-15 NOTE — Telephone Encounter (Signed)
Patient NTBS for follow up and lab work  

## 2013-08-15 NOTE — Telephone Encounter (Signed)
Patient last seen in office on 03-25-13 for an acute visit. Do not see on current med list. Please advise

## 2013-08-23 ENCOUNTER — Other Ambulatory Visit: Payer: Self-pay | Admitting: Nurse Practitioner

## 2013-08-23 NOTE — Telephone Encounter (Signed)
Needs to seen before next refill

## 2013-09-05 ENCOUNTER — Other Ambulatory Visit: Payer: Self-pay | Admitting: Nurse Practitioner

## 2013-09-14 ENCOUNTER — Encounter: Payer: Self-pay | Admitting: Cardiovascular Disease

## 2013-09-14 ENCOUNTER — Ambulatory Visit (INDEPENDENT_AMBULATORY_CARE_PROVIDER_SITE_OTHER): Payer: Medicare Other | Admitting: Cardiovascular Disease

## 2013-09-14 VITALS — BP 156/69 | HR 68 | Ht 62.0 in | Wt 162.4 lb

## 2013-09-14 DIAGNOSIS — I359 Nonrheumatic aortic valve disorder, unspecified: Secondary | ICD-10-CM

## 2013-09-14 DIAGNOSIS — IMO0001 Reserved for inherently not codable concepts without codable children: Secondary | ICD-10-CM

## 2013-09-14 DIAGNOSIS — I10 Essential (primary) hypertension: Secondary | ICD-10-CM

## 2013-09-14 DIAGNOSIS — I350 Nonrheumatic aortic (valve) stenosis: Secondary | ICD-10-CM | POA: Insufficient documentation

## 2013-09-14 NOTE — Assessment & Plan Note (Signed)
Cholesterol is at goal.  Continue current dose of statin and diet Rx.  No myalgias or side effects.  F/U  LFT's in 6 months. Lab Results  Component Value Date   LDLCALC 89 02/17/2013

## 2013-09-14 NOTE — Progress Notes (Signed)
Patient ID: [name], female   DOB: [birth year], 78 y.o.   MRN: [id number] [name] is seen today in followup for chest pain aortic stenosis and hypercholesterolemia. His been doing well. She has arthritis and fibromyalgia.She had an essentially normal heart cath in 2008 and a normal Myoview 2009. Her EKGs have been normal. Don't think her current chest pains or angina. She has moderate  aortic stenosis.  Her dentition is in good shape. He is not any palpitations PND orthopnea his been no syncope lower extremity edema or fever of unknown origin.  Echo 1/15  Moderate AS     ROS: Denies fever, malais, weight loss, blurry vision, decreased visual acuity, cough, sputum, SOB, hemoptysis, pleuritic pain, palpitaitons, heartburn, abdominal pain, melena, lower extremity edema, claudication, or rash.  All other systems reviewed and negative  General: Affect appropriate Healthy:  appears stated age 9: normal Neck supple with no adenopathy JVP normal no bruits no thyromegaly Lungs clear with no wheezing and good diaphragmatic motion Heart:  S1/S2  Preserved AS  murmur, no rub, gallop or click PMI normal Abdomen: benighn, BS positve, no tenderness, no AAA no bruit.  No HSM or HJR Distal pulses intact with no bruits No edema Neuro non-focal Skin warm and dry No muscular weakness   Current Outpatient Prescriptions  Medication Sig Dispense Refill  . AMBULATORY NON FORMULARY MEDICATION [employer] Blood Builder Take 1 tablet by mouth once daily      . aspirin 81 MG tablet Take 81 mg by mouth daily.       Marland Kitchen atorvastatin (LIPITOR) 40 MG tablet TAKE 1 TABLET (40 MG TOTAL) BY MOUTH DAILY AT 6 PM.  30 tablet  0  . Calcium-Vitamin D 600-125 MG-UNIT TABS Take 1 tablet by mouth at bedtime.       . clobetasol cream (TEMOVATE) 4.09 % Apply 1 application topically 2 (two) times daily as needed (for rash on pelvic area).      . Coenzyme Q10 (COQ-10 PO) Take 1 tablet by mouth daily.      . cycloSPORINE (RESTASIS)  0.05 % ophthalmic emulsion Place 1 drop into both eyes 2 (two) times daily.      Marland Kitchen gabapentin (NEURONTIN) 600 MG tablet TAKE 1 TABLET (600 MG TOTAL)  BY MOUTH 3 (THREE) TIMES DAILY.  90 tablet  4  . hydrocortisone (ANUSOL-HC) 25 MG suppository Place 1 suppository (25 mg total) rectally 2 (two) times daily.  12 suppository  1  . hydrocortisone (PROCTOZONE-HC) 2.5 % rectal cream Place 1 application rectally 2 (two) times daily.  30 g  0  . hydroxypropyl methylcellulose (ISOPTO TEARS) 2.5 % ophthalmic solution Place 2 drops into both eyes 3 (three) times daily as needed for dry eyes.      . metoprolol (LOPRESSOR) 50 MG tablet Take 1 tablet (50 mg total) by mouth 2 (two) times daily.  180 tablet  1  . omeprazole (PRILOSEC) 20 MG capsule TAKE 1 CAPSULE (20 MG TOTAL)  BY MOUTH EVERY EVENING.  30 capsule  0  . polyethylene glycol powder (GLYCOLAX/MIRALAX) powder Take 255 g (1 Container total) by mouth once.  255 g  2   No current facility-administered medications for this visit.    Allergies  Latex and Tape  Electrocardiogram: 2.17  SR normal ECG   Assessment and Plan

## 2013-09-14 NOTE — Assessment & Plan Note (Signed)
Well controlled.  Continue current medications and low sodium Dash type diet.    

## 2013-09-14 NOTE — Assessment & Plan Note (Signed)
Consider changing meds as they make her hungry and weight gain  Stable

## 2013-09-14 NOTE — Patient Instructions (Signed)
Your physician wants you to follow-up in: JAN 2016  Agenda  ECHO  SAME DAY  You will receive a reminder letter in the mail two months in advance. If you don't receive a letter, please call our office to schedule the follow-up appointment. Your physician recommends that you continue on your current medications as directed. Please refer to the Current Medication list given to you today.

## 2013-09-14 NOTE — Assessment & Plan Note (Signed)
NO change in murmur  F/U echo 1/16  Discussed signs of dyspnea, chest pain and pre syncope

## 2013-09-21 ENCOUNTER — Other Ambulatory Visit: Payer: Self-pay | Admitting: Nurse Practitioner

## 2013-10-25 ENCOUNTER — Other Ambulatory Visit: Payer: Self-pay | Admitting: Nurse Practitioner

## 2013-10-25 NOTE — Telephone Encounter (Signed)
Patient notified at last refill that NTBS. Please advise 

## 2013-10-25 NOTE — Telephone Encounter (Signed)
no more refills without being seen  

## 2013-10-31 ENCOUNTER — Ambulatory Visit (INDEPENDENT_AMBULATORY_CARE_PROVIDER_SITE_OTHER): Payer: Medicare Other | Admitting: Family

## 2013-10-31 ENCOUNTER — Encounter: Payer: Self-pay | Admitting: Family

## 2013-10-31 VITALS — BP 142/65 | HR 62 | Temp 97.8°F | Ht 62.0 in | Wt 162.4 lb

## 2013-10-31 DIAGNOSIS — G5602 Carpal tunnel syndrome, left upper limb: Secondary | ICD-10-CM

## 2013-10-31 DIAGNOSIS — E785 Hyperlipidemia, unspecified: Secondary | ICD-10-CM

## 2013-10-31 DIAGNOSIS — Z1321 Encounter for screening for nutritional disorder: Secondary | ICD-10-CM

## 2013-10-31 DIAGNOSIS — G5600 Carpal tunnel syndrome, unspecified upper limb: Secondary | ICD-10-CM

## 2013-10-31 DIAGNOSIS — M1990 Unspecified osteoarthritis, unspecified site: Secondary | ICD-10-CM

## 2013-10-31 DIAGNOSIS — I10 Essential (primary) hypertension: Secondary | ICD-10-CM

## 2013-10-31 DIAGNOSIS — M129 Arthropathy, unspecified: Secondary | ICD-10-CM

## 2013-10-31 DIAGNOSIS — K219 Gastro-esophageal reflux disease without esophagitis: Secondary | ICD-10-CM

## 2013-10-31 DIAGNOSIS — IMO0001 Reserved for inherently not codable concepts without codable children: Secondary | ICD-10-CM

## 2013-10-31 MED ORDER — ATORVASTATIN CALCIUM 40 MG PO TABS: ORAL_TABLET | ORAL | Status: DC

## 2013-10-31 MED ORDER — METOPROLOL TARTRATE 50 MG PO TABS: 50.0000 mg | ORAL_TABLET | Freq: Two times a day (BID) | ORAL | Status: DC

## 2013-10-31 MED ORDER — OMEPRAZOLE 20 MG PO CPDR: DELAYED_RELEASE_CAPSULE | ORAL | Status: DC

## 2013-10-31 MED ORDER — CELECOXIB 100 MG PO CAPS: 100.0000 mg | ORAL_CAPSULE | Freq: Every day | ORAL | Status: DC

## 2013-10-31 NOTE — Patient Instructions (Signed)
Carpal Tunnel Syndrome The carpal tunnel is a narrow area located on the palm side of your wrist. The tunnel is formed by the wrist bones and ligaments. Nerves, blood vessels, and tendons pass through the carpal tunnel. Repeated wrist motion or certain diseases may cause swelling within the tunnel. This swelling pinches the main nerve in the wrist (median nerve) and causes the painful hand and arm condition called carpal tunnel syndrome. CAUSES   Repeated wrist motions.  Wrist injuries.  Certain diseases like arthritis, diabetes, alcoholism, hyperthyroidism, and kidney failure.  Obesity.  Pregnancy. SYMPTOMS   A "pins and needles" feeling in your fingers or hand.  Tingling or numbness in your fingers or hand.  An aching feeling in your entire arm.  Wrist pain that goes up your arm to your shoulder.  Pain that goes down into your palm or fingers.  A weak feeling in your hands. DIAGNOSIS  Your caregiver will take your history and perform a physical exam. An electromyography test may be needed. This test measures electrical signals sent out by the muscles. The electrical signals are usually slowed by carpal tunnel syndrome. You may also need X-rays. TREATMENT  Carpal tunnel syndrome may clear up by itself. Your caregiver may recommend a wrist splint or medicine such as a nonsteroidal anti-inflammatory medicine. Cortisone injections may help. Sometimes, surgery may be needed to free the pinched nerve.  HOME CARE INSTRUCTIONS   Take all medicine as directed by your caregiver. Only take over-the-counter or prescription medicines for pain, discomfort, or fever as directed by your caregiver.  If you were given a splint to keep your wrist from bending, wear it as directed. It is important to wear the splint at night. Wear the splint for as long as you have pain or numbness in your hand, arm, or wrist. This may take 1 to 2 months.  Rest your wrist from any activity that may be causing your  pain. If your symptoms are work-related, you may need to talk to your employer about changing to a job that does not require using your wrist.  Put ice on your wrist after long periods of wrist activity.  Put ice in a plastic bag.  Place a towel between your skin and the bag.  Leave the ice on for 15-20 minutes, 03-04 times a day.  Keep all follow-up visits as directed by your caregiver. This includes any orthopedic referrals, physical therapy, and rehabilitation. Any delay in getting necessary care could result in a delay or failure of your condition to heal. SEEK IMMEDIATE MEDICAL CARE IF:   You have new, unexplained symptoms.  Your symptoms get worse and are not helped or controlled with medicines. MAKE SURE YOU:   Understand these instructions.  Will watch your condition.  Will get help right away if you are not doing well or get worse. Document Released: 03/27/2000 Document Revised: 06/22/2011 Document Reviewed: 02/13/2011 [facility] Patient Information 2015 [place], [place]. This information is not intended to replace advice given to you by your health care provider. Make sure you discuss any questions you have with your health care provider. Health Maintenance, Female A healthy lifestyle and preventative care can promote health and wellness.  Maintain regular health, dental, and eye exams.  Eat a healthy diet. Foods like vegetables, fruits, whole grains, low-fat dairy products, and lean protein foods contain the nutrients you need without too many calories. Decrease your intake of foods high in solid fats, added sugars, and salt. Get information about a proper  diet from your caregiver, if necessary.  Regular physical exercise is one of the most important things you can do for your health. Most adults should get at least 150 minutes of moderate-intensity exercise (any activity that increases your heart rate and causes you to sweat) each week. In addition, most adults need  muscle-strengthening exercises on 2 or more days a week.   Maintain a healthy weight. The body mass index (BMI) is a screening tool to identify possible weight problems. It provides an estimate of body fat based on height and weight. Your caregiver can help determine your BMI, and can help you achieve or maintain a healthy weight. For adults 20 years and older:  A BMI below 18.5 is considered underweight.  A BMI of 18.5 to 24.9 is normal.  A BMI of 25 to 29.9 is considered overweight.  A BMI of 30 and above is considered obese.  Maintain normal blood lipids and cholesterol by exercising and minimizing your intake of saturated fat. Eat a balanced diet with plenty of fruits and vegetables. Blood tests for lipids and cholesterol should begin at age 41 and be repeated every 5 years. If your lipid or cholesterol levels are high, you are over 50, or you are a high risk for heart disease, you may need your cholesterol levels checked more frequently.Ongoing high lipid and cholesterol levels should be treated with medicines if diet and exercise are not effective.  If you smoke, find out from your caregiver how to quit. If you do not use tobacco, do not start.  Lung cancer screening is recommended for adults aged 64-80 years who are at high risk for developing lung cancer because of a history of smoking. Yearly low-dose computed tomography (CT) is recommended for people who have at least a 30-pack-year history of smoking and are a current smoker or have quit within the past 15 years. A pack year of smoking is smoking an average of 1 pack of cigarettes a day for 1 year (for example: 1 pack a day for 30 years or 2 packs a day for 15 years). Yearly screening should continue until the smoker has stopped smoking for at least 15 years. Yearly screening should also be stopped for people who develop a health problem that would prevent them from having lung cancer treatment.  If you are pregnant, do not drink  alcohol. If you are breastfeeding, be very cautious about drinking alcohol. If you are not pregnant and choose to drink alcohol, do not exceed 1 drink per day. One drink is considered to be 12 ounces (355 mL) of beer, 5 ounces (148 mL) of wine, or 1.5 ounces (44 mL) of liquor.  Avoid use of street drugs. Do not share needles with anyone. Ask for help if you need support or instructions about stopping the use of drugs.  High blood pressure causes heart disease and increases the risk of stroke. Blood pressure should be checked at least every 1 to 2 years. Ongoing high blood pressure should be treated with medicines, if weight loss and exercise are not effective.  If you are 2 to 76 years old, ask your caregiver if you should take aspirin to prevent strokes.  Diabetes screening involves taking a blood sample to check your fasting blood sugar level. This should be done once every 3 years, after age 71, if you are within normal weight and without risk factors for diabetes. Testing should be considered at a younger age or be carried out more frequently if  you are overweight and have at least 1 risk factor for diabetes.  Breast cancer screening is essential preventative care for women. You should practice "breast self-awareness." This means understanding the normal appearance and feel of your breasts and may include breast self-examination. Any changes detected, no matter how small, should be reported to a caregiver. Women in their 28s and 30s should have a clinical breast exam (CBE) by a caregiver as part of a regular health exam every 1 to 3 years. After age 70, women should have a CBE every year. Starting at age 30, women should consider having a mammogram (breast X-ray) every year. Women who have a family history of breast cancer should talk to their caregiver about genetic screening. Women at a high risk of breast cancer should talk to their caregiver about having an MRI and a mammogram every  year.  Breast cancer gene (BRCA)-related cancer risk assessment is recommended for women who have family members with BRCA-related cancers. BRCA-related cancers include breast, ovarian, tubal, and peritoneal cancers. Having family members with these cancers may be associated with an increased risk for harmful changes (mutations) in the breast cancer genes BRCA1 and BRCA2. Results of the assessment will determine the need for genetic counseling and BRCA1 and BRCA2 testing.  The Pap test is a screening test for cervical cancer. Women should have a Pap test starting at age 88. Between ages 29 and 73, Pap tests should be repeated every 2 years. Beginning at age 30, you should have a Pap test every 3 years as long as the past 3 Pap tests have been normal. If you had a hysterectomy for a problem that was not cancer or a condition that could lead to cancer, then you no longer need Pap tests. If you are between ages 10 and 12, and you have had normal Pap tests going back 10 years, you no longer need Pap tests. If you have had past treatment for cervical cancer or a condition that could lead to cancer, you need Pap tests and screening for cancer for at least 20 years after your treatment. If Pap tests have been discontinued, risk factors (such as a new sexual partner) need to be reassessed to determine if screening should be resumed. Some women have medical problems that increase the chance of getting cervical cancer. In these cases, your caregiver may recommend more frequent screening and Pap tests.  The human papillomavirus (HPV) test is an additional test that may be used for cervical cancer screening. The HPV test looks for the virus that can cause the cell changes on the cervix. The cells collected during the Pap test can be tested for HPV. The HPV test could be used to screen women aged 58 years and older, and should be used in women of any age who have unclear Pap test results. After the age of 62, women should  have HPV testing at the same frequency as a Pap test.  Colorectal cancer can be detected and often prevented. Most routine colorectal cancer screening begins at the age of 23 and continues through age 73. However, your caregiver may recommend screening at an earlier age if you have risk factors for colon cancer. On a yearly basis, your caregiver may provide home test kits to check for hidden blood in the stool. Use of a small camera at the end of a tube, to directly examine the colon (sigmoidoscopy or colonoscopy), can detect the earliest forms of colorectal cancer. Talk to your caregiver about this  at age 8, when routine screening begins. Direct examination of the colon should be repeated every 5 to 10 years through age 46, unless early forms of pre-cancerous polyps or small growths are found.  Hepatitis C blood testing is recommended for all people born from [birth year] through 1963 and any individual with known risks for hepatitis C.  Practice safe sex. Use condoms and avoid high-risk sexual practices to reduce the spread of sexually transmitted infections (STIs). Sexually active women aged 44 and younger should be checked for Chlamydia, which is a common sexually transmitted infection. Older women with new or multiple partners should also be tested for Chlamydia. Testing for other STIs is recommended if you are sexually active and at increased risk.  Osteoporosis is a disease in which the bones lose minerals and strength with aging. This can result in serious bone fractures. The risk of osteoporosis can be identified using a bone density scan. Women ages 53 and over and women at risk for fractures or osteoporosis should discuss screening with their caregivers. Ask your caregiver whether you should be taking a calcium supplement or vitamin D to reduce the rate of osteoporosis.  Menopause can be associated with physical symptoms and risks. Hormone replacement therapy is available to decrease symptoms and  risks. You should talk to your caregiver about whether hormone replacement therapy is right for you.  Use sunscreen. Apply sunscreen liberally and repeatedly throughout the day. You should seek shade when your shadow is shorter than you. Protect yourself by wearing long sleeves, pants, a wide-brimmed hat, and sunglasses year round, whenever you are outdoors.  Notify your caregiver of new moles or changes in moles, especially if there is a change in shape or color. Also notify your caregiver if a mole is larger than the size of a pencil eraser.  Stay current with your immunizations. Document Released: 10/13/2010 Document Revised: 07/25/2012 Document Reviewed: 03/01/2013 [facility] Patient Information 2015 [place], [place]. This information is not intended to replace advice given to you by your health care provider. Make sure you discuss any questions you have with your health care provider.

## 2013-10-31 NOTE — Progress Notes (Signed)
Subjective:    Patient ID: [name], female    DOB: 12/04/1935, 78 y.o.   MRN: [id number]  Hypertension This is a chronic problem. The current episode started more than 1 year ago. The problem has been waxing and waning since onset. The problem is uncontrolled. Pertinent negatives include no anxiety, chest pain, headaches, palpitations, peripheral edema or shortness of breath. Risk factors for coronary artery disease include dyslipidemia, obesity and post-menopausal state. Past treatments include beta blockers. The current treatment provides moderate improvement. There is no history of kidney disease, CAD/MI, heart failure or a thyroid problem. Identifiable causes of hypertension include sleep apnea.  Hyperlipidemia This is a chronic problem. The current episode started more than 1 year ago. The problem is uncontrolled. Recent lipid tests were reviewed and are high. She has no history of diabetes or hypothyroidism. Factors aggravating her hyperlipidemia include fatty foods. Associated symptoms include leg pain and myalgias. Pertinent negatives include no chest pain or shortness of breath. Current antihyperlipidemic treatment includes statins. The current treatment provides moderate improvement of lipids. Risk factors for coronary artery disease include dyslipidemia, hypertension and post-menopausal.  Gastrophageal Reflux She reports no abdominal pain, no belching, no chest pain, no coughing, no heartburn or no sore throat. This is a chronic problem. The current episode started more than 1 year ago. The problem occurs rarely. The problem has been waxing and waning. The symptoms are aggravated by certain foods and lying down. She has tried a PPI for the symptoms. The treatment provided moderate relief.      Review of Systems  Constitutional: Negative.   HENT: Negative.  Negative for sore throat.   Eyes: Negative.   Respiratory: Negative.  Negative for cough and shortness of breath.     Cardiovascular: Negative.  Negative for chest pain and palpitations.  Gastrointestinal: Negative.  Negative for heartburn and abdominal pain.  Endocrine: Negative.   Genitourinary: Negative.   Musculoskeletal: Positive for arthralgias, joint swelling (left hand) and myalgias.  Neurological: Negative.  Negative for headaches.  Hematological: Negative.   Psychiatric/Behavioral: Negative.   All other systems reviewed and are negative.      Objective:   Physical Exam  Vitals reviewed. Constitutional: She is oriented to person, place, and time. She appears well-developed and well-nourished. No distress.  HENT:  Head: Normocephalic and atraumatic.  Right Ear: External ear normal.  Mouth/Throat: Oropharynx is clear and moist.  Eyes: Pupils are equal, round, and reactive to light.  Neck: Normal range of motion. Neck supple. No thyromegaly present.  Cardiovascular: Normal rate, regular rhythm and intact distal pulses.   Murmur heard. Pulmonary/Chest: Effort normal and breath sounds normal. No respiratory distress. She has no wheezes.  Abdominal: Soft. Bowel sounds are normal. She exhibits no distension. There is no tenderness.  Musculoskeletal: Normal range of motion. She exhibits tenderness (mild tenderness left ankle). She exhibits no edema.  Neurological: She is alert and oriented to person, place, and time. She has normal reflexes. No cranial nerve deficit.  Skin: Skin is warm and dry.  Psychiatric: She has a normal mood and affect. Her behavior is normal. Judgment and thought content normal.    BP 142/65  Pulse 62  Temp(Src) 97.8 F (36.6 C) (Oral)  Ht _0  (1.575 m)  Wt 162 lb 6.4 oz (73.664 kg)  BMI 29.70 kg/m2       Assessment & Plan:  1. Gastroesophageal reflux disease without esophagitis - omeprazole (PRILOSEC) 20 MG capsule; TAKE 1 CAPSULE (20 MG TOTAL)  BY MOUTH EVERY EVENING.  Dispense: 90 capsule; Refill: 0  2. FIBROMYALGIA  3. HYPERLIPIDEMIA - Lipid panel -  atorvastatin (LIPITOR) 40 MG tablet; TAKE 1 TABLET BY MOUTH DAILY AT 6 PM  Dispense: 90 tablet; Refill: 1  4. HYPERTENSION, UNSPECIFIED - CMP14+EGFR - metoprolol (LOPRESSOR) 50 MG tablet; Take 1 tablet (50 mg total) by mouth 2 (two) times daily.  Dispense: 180 tablet; Refill: 1  5. Arthritis - Arthritis Panel - CMP14+EGFR  6. Encounter for vitamin deficiency screening - Vit D  25 hydroxy (rtn osteoporosis monitoring)  7. Carpal tunnel syndrome of left wrist -Rest -Splint given on visit -Take medication with food -No other NSAIDS - celecoxib (CELEBREX) 100 MG capsule; Take 1 capsule (100 mg total) by mouth daily.  Dispense: 30 capsule; Refill: 1   Continue all meds Labs pending Health Maintenance reviewed Diet and exercise encouraged RTO 61month  CEvelina Dun FNP

## 2013-11-01 ENCOUNTER — Other Ambulatory Visit: Payer: Self-pay | Admitting: Family

## 2013-11-01 LAB — LIPID PANEL
CHOL/HDL RATIO: 3.4 ratio (ref 0.0–4.4)
CHOLESTEROL TOTAL: 168 mg/dL (ref 100–199)
HDL: 50 mg/dL (ref >39)
LDL Calculated: 73 mg/dL (ref 0–99)
Triglycerides: 226 mg/dL — ABNORMAL HIGH (ref 0–149)
VLDL Cholesterol Cal: 45 mg/dL — ABNORMAL HIGH (ref 5–40)

## 2013-11-01 LAB — CMP14+EGFR
ALBUMIN: 4.1 g/dL (ref 3.5–4.8)
ALT: 16 IU/L (ref 0–32)
AST: 18 IU/L (ref 0–40)
Albumin/Globulin Ratio: 1.8 (ref 1.1–2.5)
Alkaline Phosphatase: 85 IU/L (ref 39–117)
BUN/Creatinine Ratio: 14 (ref 11–26)
BUN: 11 mg/dL (ref 8–27)
CO2: 26 mmol/L (ref 18–29)
CREATININE: 0.8 mg/dL (ref 0.57–1.00)
Calcium: 9.3 mg/dL (ref 8.7–10.3)
Chloride: 102 mmol/L (ref 97–108)
GFR calc Af Amer: 83 mL/min/{1.73_m2} (ref >59)
GFR calc non Af Amer: 72 mL/min/{1.73_m2} (ref >59)
GLOBULIN, TOTAL: 2.3 g/dL (ref 1.5–4.5)
Glucose: 114 mg/dL — ABNORMAL HIGH (ref 65–99)
Potassium: 5.1 mmol/L (ref 3.5–5.2)
Sodium: 142 mmol/L (ref 134–144)
Total Bilirubin: 0.3 mg/dL (ref 0.0–1.2)
Total Protein: 6.4 g/dL (ref 6.0–8.5)

## 2013-11-01 LAB — ARTHRITIS PANEL
Basophils Absolute: 0.1 10*3/uL (ref 0.0–0.2)
Basos: 1 %
EOS: 5 %
Eosinophils Absolute: 0.3 10*3/uL (ref 0.0–0.4)
HCT: 38.6 % (ref 34.0–46.6)
HEMOGLOBIN: 12.5 g/dL (ref 11.1–15.9)
IMMATURE GRANS (ABS): 0 10*3/uL (ref 0.0–0.1)
Immature Granulocytes: 0 %
LYMPHS: 33 %
Lymphocytes Absolute: 2.1 10*3/uL (ref 0.7–3.1)
MCH: 29.4 pg (ref 26.6–33.0)
MCHC: 32.4 g/dL (ref 31.5–35.7)
MCV: 91 fL (ref 79–97)
Monocytes Absolute: 0.5 10*3/uL (ref 0.1–0.9)
Monocytes: 8 %
NEUTROS PCT: 53 %
Neutrophils Absolute: 3.3 10*3/uL (ref 1.4–7.0)
Platelets: 240 10*3/uL (ref 150–379)
RBC: 4.25 x10E6/uL (ref 3.77–5.28)
RDW: 13.8 % (ref 12.3–15.4)
RHEUMATOID FACTOR: 9.4 [IU]/mL (ref 0.0–13.9)
Sed Rate: 5 mm/hr (ref 0–40)
URIC ACID: 4.5 mg/dL (ref 2.5–7.1)
WBC: 6.3 10*3/uL (ref 3.4–10.8)

## 2013-11-01 LAB — VITAMIN D 25 HYDROXY (VIT D DEFICIENCY, FRACTURES): VIT D 25 HYDROXY: 20.5 ng/mL — AB (ref 30.0–100.0)

## 2013-11-01 MED ORDER — ATORVASTATIN CALCIUM 80 MG PO TABS: 80.0000 mg | ORAL_TABLET | Freq: Every day | ORAL | Status: DC

## 2013-11-01 MED ORDER — VITAMIN D (ERGOCALCIFEROL) 1.25 MG (50000 UNIT) PO CAPS: 50000.0000 [IU] | ORAL_CAPSULE | ORAL | Status: DC

## 2013-11-02 ENCOUNTER — Telehealth: Payer: Self-pay | Admitting: Family

## 2013-11-02 DIAGNOSIS — G5602 Carpal tunnel syndrome, left upper limb: Secondary | ICD-10-CM

## 2013-11-02 DIAGNOSIS — G5601 Carpal tunnel syndrome, right upper limb: Secondary | ICD-10-CM

## 2013-11-06 ENCOUNTER — Other Ambulatory Visit: Payer: Self-pay | Admitting: Nurse Practitioner

## 2013-11-06 NOTE — Telephone Encounter (Signed)
Referral sent to ortho for left wrist pain/carpal tunnel

## 2013-12-01 ENCOUNTER — Telehealth: Payer: Self-pay | Admitting: Family

## 2013-12-01 NOTE — Telephone Encounter (Signed)
Was this increased because triglycerides were elevated last month? This script was written after those labs were received.

## 2013-12-01 NOTE — Telephone Encounter (Signed)
Yes because of her elevated triglycerides. Pt needs to start new medication and encourage low fat diet

## 2013-12-04 NOTE — Telephone Encounter (Signed)
Pt notified of elevated cholesterol levels Verbalizes understanding

## 2014-01-01 ENCOUNTER — Encounter (HOSPITAL_BASED_OUTPATIENT_CLINIC_OR_DEPARTMENT_OTHER): Payer: Self-pay | Admitting: *Deleted

## 2014-01-02 ENCOUNTER — Encounter (HOSPITAL_BASED_OUTPATIENT_CLINIC_OR_DEPARTMENT_OTHER): Payer: Self-pay | Admitting: *Deleted

## 2014-01-02 NOTE — Progress Notes (Signed)
SPOKE W/ PT AND HUSBAND.  NPO AFTER MN WITH EXCEPTION CLEAR LIQUIDS UNTIL 0830 (NO CREAM/ MILK PRODUCTS). ARRIVE AT 1315. NEEDS ISTAT.  CURRENT EKG IN CHART AND EPIC. WILL TAKE GABAPENTIN AND METOPROLOL AM DOS W/ SIPS OF WATER.  PT HAS HX HYPOGLYCEMIA, VERBALIZED UNDERSTANDING IF FEELING SYMPTOMATIC COME IN EARLIER AND NOTHING MY MOUTH.

## 2014-01-04 ENCOUNTER — Ambulatory Visit (HOSPITAL_BASED_OUTPATIENT_CLINIC_OR_DEPARTMENT_OTHER): Payer: Medicare Other | Admitting: Anesthesiology

## 2014-01-04 ENCOUNTER — Encounter (HOSPITAL_BASED_OUTPATIENT_CLINIC_OR_DEPARTMENT_OTHER)
Admission: RE | Disposition: A | Discharge status: Home / Self Care | Payer: Self-pay | Source: Ambulatory Visit | Attending: Orthopedic Surgery

## 2014-01-04 ENCOUNTER — Ambulatory Visit (HOSPITAL_BASED_OUTPATIENT_CLINIC_OR_DEPARTMENT_OTHER)
Admission: RE | Admit: 2014-01-04 | Discharge: 2014-01-04 | Disposition: A | Discharge status: Home / Self Care | Payer: Medicare Other | Source: Ambulatory Visit | Attending: Orthopedic Surgery | Admitting: Orthopedic Surgery

## 2014-01-04 ENCOUNTER — Encounter (HOSPITAL_BASED_OUTPATIENT_CLINIC_OR_DEPARTMENT_OTHER): Payer: Self-pay | Admitting: *Deleted

## 2014-01-04 ENCOUNTER — Encounter (HOSPITAL_BASED_OUTPATIENT_CLINIC_OR_DEPARTMENT_OTHER): Payer: Medicare Other | Admitting: Anesthesiology

## 2014-01-04 DIAGNOSIS — K219 Gastro-esophageal reflux disease without esophagitis: Secondary | ICD-10-CM | POA: Diagnosis not present

## 2014-01-04 DIAGNOSIS — Z79899 Other long term (current) drug therapy: Secondary | ICD-10-CM | POA: Diagnosis not present

## 2014-01-04 DIAGNOSIS — Z7982 Long term (current) use of aspirin: Secondary | ICD-10-CM | POA: Insufficient documentation

## 2014-01-04 DIAGNOSIS — Z9109 Other allergy status, other than to drugs and biological substances: Secondary | ICD-10-CM | POA: Diagnosis not present

## 2014-01-04 DIAGNOSIS — IMO0001 Reserved for inherently not codable concepts without codable children: Secondary | ICD-10-CM | POA: Diagnosis not present

## 2014-01-04 DIAGNOSIS — L259 Unspecified contact dermatitis, unspecified cause: Secondary | ICD-10-CM | POA: Diagnosis not present

## 2014-01-04 DIAGNOSIS — M6580 Other synovitis and tenosynovitis, unspecified site: Secondary | ICD-10-CM | POA: Diagnosis not present

## 2014-01-04 DIAGNOSIS — R51 Headache: Secondary | ICD-10-CM | POA: Diagnosis not present

## 2014-01-04 DIAGNOSIS — Z860100 Personal history of colon polyps, unspecified: Secondary | ICD-10-CM | POA: Insufficient documentation

## 2014-01-04 DIAGNOSIS — Z9089 Acquired absence of other organs: Secondary | ICD-10-CM | POA: Insufficient documentation

## 2014-01-04 DIAGNOSIS — I10 Essential (primary) hypertension: Secondary | ICD-10-CM | POA: Diagnosis not present

## 2014-01-04 DIAGNOSIS — M129 Arthropathy, unspecified: Secondary | ICD-10-CM | POA: Insufficient documentation

## 2014-01-04 DIAGNOSIS — E785 Hyperlipidemia, unspecified: Secondary | ICD-10-CM | POA: Diagnosis not present

## 2014-01-04 DIAGNOSIS — M47812 Spondylosis without myelopathy or radiculopathy, cervical region: Secondary | ICD-10-CM | POA: Diagnosis not present

## 2014-01-04 DIAGNOSIS — I359 Nonrheumatic aortic valve disorder, unspecified: Secondary | ICD-10-CM | POA: Insufficient documentation

## 2014-01-04 DIAGNOSIS — G4733 Obstructive sleep apnea (adult) (pediatric): Secondary | ICD-10-CM | POA: Insufficient documentation

## 2014-01-04 DIAGNOSIS — Z9079 Acquired absence of other genital organ(s): Secondary | ICD-10-CM | POA: Diagnosis not present

## 2014-01-04 DIAGNOSIS — I2510 Atherosclerotic heart disease of native coronary artery without angina pectoris: Secondary | ICD-10-CM | POA: Diagnosis not present

## 2014-01-04 DIAGNOSIS — M778 Other enthesopathies, not elsewhere classified: Secondary | ICD-10-CM

## 2014-01-04 DIAGNOSIS — R011 Cardiac murmur, unspecified: Secondary | ICD-10-CM | POA: Diagnosis not present

## 2014-01-04 HISTORY — PX: TENOSYNOVECTOMY: SHX6110

## 2014-01-04 HISTORY — DX: Nonrheumatic aortic (valve) stenosis: I35.0

## 2014-01-04 LAB — POCT I-STAT 4, (NA,K, GLUC, HGB,HCT)
Glucose, Bld: 95 mg/dL (ref 70–99)
HEMATOCRIT: 43 % (ref 36.0–46.0)
HEMOGLOBIN: 14.6 g/dL (ref 12.0–15.0)
Potassium: 4.6 mEq/L (ref 3.7–5.3)
SODIUM: 139 meq/L (ref 137–147)

## 2014-01-04 SURGERY — TENOSYNOVECTOMY
Anesthesia: General | Site: Wrist | Laterality: Left

## 2014-01-04 MED ORDER — HYDROCODONE-ACETAMINOPHEN 5-300 MG PO TABS: 1.0000 | ORAL_TABLET | Freq: Four times a day (QID) | ORAL | Status: DC | PRN

## 2014-01-04 MED ORDER — CHLORHEXIDINE GLUCONATE 4 % EX LIQD
60.0000 mL | Freq: Once | CUTANEOUS | Status: DC
  Filled 2014-01-04: qty 60

## 2014-01-04 MED ORDER — LACTATED RINGERS IV SOLN
INTRAVENOUS | Status: DC
  Administered 2014-01-04 (×2): via INTRAVENOUS
  Filled 2014-01-04: qty 1000

## 2014-01-04 MED ORDER — HYDROCODONE-ACETAMINOPHEN 5-325 MG PO TABS
1.0000 | ORAL_TABLET | Freq: Four times a day (QID) | ORAL | Status: DC | PRN
  Administered 2014-01-04: 1 via ORAL
  Filled 2014-01-04: qty 1

## 2014-01-04 MED ORDER — PROMETHAZINE HCL 25 MG/ML IJ SOLN
6.2500 mg | INTRAMUSCULAR | Status: DC | PRN
  Filled 2014-01-04: qty 1

## 2014-01-04 MED ORDER — ONDANSETRON HCL 4 MG/2ML IJ SOLN
INTRAMUSCULAR | Status: DC | PRN
  Administered 2014-01-04: 4 mg via INTRAVENOUS

## 2014-01-04 MED ORDER — HYDROCODONE-ACETAMINOPHEN 5-325 MG PO TABS
ORAL_TABLET | ORAL | Status: AC
  Filled 2014-01-04: qty 1

## 2014-01-04 MED ORDER — PROPOFOL INFUSION 10 MG/ML OPTIME
INTRAVENOUS | Status: DC | PRN
  Administered 2014-01-04: 100 mL via INTRAVENOUS
  Administered 2014-01-04: 50 mL via INTRAVENOUS

## 2014-01-04 MED ORDER — MEPERIDINE HCL 25 MG/ML IJ SOLN
6.2500 mg | INTRAMUSCULAR | Status: DC | PRN
  Filled 2014-01-04: qty 1

## 2014-01-04 MED ORDER — CEFAZOLIN SODIUM-DEXTROSE 2-3 GM-% IV SOLR
2.0000 g | INTRAVENOUS | Status: AC
  Administered 2014-01-04: 2 g via INTRAVENOUS
  Filled 2014-01-04: qty 50

## 2014-01-04 MED ORDER — SODIUM CHLORIDE 0.9 % IR SOLN
Status: DC | PRN
  Administered 2014-01-04: 500 mL

## 2014-01-04 MED ORDER — FENTANYL CITRATE 0.05 MG/ML IJ SOLN
25.0000 ug | INTRAMUSCULAR | Status: DC | PRN
  Filled 2014-01-04: qty 1

## 2014-01-04 MED ORDER — FENTANYL CITRATE 0.05 MG/ML IJ SOLN
INTRAMUSCULAR | Status: AC
  Filled 2014-01-04: qty 4

## 2014-01-04 MED ORDER — LIDOCAINE HCL (CARDIAC) 20 MG/ML IV SOLN
INTRAVENOUS | Status: DC | PRN
  Administered 2014-01-04: 80 mg via INTRAVENOUS

## 2014-01-04 MED ORDER — FENTANYL CITRATE 0.05 MG/ML IJ SOLN
INTRAMUSCULAR | Status: DC | PRN
  Administered 2014-01-04 (×2): 50 ug via INTRAVENOUS

## 2014-01-04 MED ORDER — MIDAZOLAM HCL 2 MG/2ML IJ SOLN
INTRAMUSCULAR | Status: AC
  Filled 2014-01-04: qty 2

## 2014-01-04 MED ORDER — DEXAMETHASONE SODIUM PHOSPHATE 10 MG/ML IJ SOLN
INTRAMUSCULAR | Status: DC | PRN
  Administered 2014-01-04: 10 mg via INTRAVENOUS

## 2014-01-04 MED ORDER — DOCUSATE SODIUM 100 MG PO CAPS: 100.0000 mg | ORAL_CAPSULE | Freq: Two times a day (BID) | ORAL | Status: DC

## 2014-01-04 SURGICAL SUPPLY — 63 items
BANDAGE CONFORM 3  STR LF (GAUZE/BANDAGES/DRESSINGS) ×3 IMPLANT
BANDAGE ELASTIC 3 VELCRO ST LF (GAUZE/BANDAGES/DRESSINGS) ×3 IMPLANT
BENZOIN TINCTURE PRP APPL 2/3 (GAUZE/BANDAGES/DRESSINGS) ×3 IMPLANT
BLADE SURG 15 STRL LF DISP TIS (BLADE) ×1 IMPLANT
BLADE SURG 15 STRL SS (BLADE) ×2
BNDG COHESIVE 3X5 TAN STRL LF (GAUZE/BANDAGES/DRESSINGS) ×3 IMPLANT
BNDG CONFORM 3 STRL LF (GAUZE/BANDAGES/DRESSINGS) ×3 IMPLANT
BNDG ELASTIC 2 VLCR STRL LF (GAUZE/BANDAGES/DRESSINGS) ×3 IMPLANT
BNDG ESMARK 4X9 LF (GAUZE/BANDAGES/DRESSINGS) ×3 IMPLANT
BNDG GAUZE ELAST 4 BULKY (GAUZE/BANDAGES/DRESSINGS) IMPLANT
CLOSURE WOUND 1/4X4 (GAUZE/BANDAGES/DRESSINGS) ×1
CORDS BIPOLAR (ELECTRODE) IMPLANT
COVER MAYO STAND STRL (DRAPES) IMPLANT
COVER TABLE BACK 60X90 (DRAPES) ×3 IMPLANT
CUFF TOURNIQUET SINGLE 18IN (TOURNIQUET CUFF) IMPLANT
DRAIN PENROSE 18X1/4 LTX STRL (WOUND CARE) IMPLANT
DRAPE EXTREMITY T 121X128X90 (DRAPE) ×3 IMPLANT
DRAPE LG THREE QUARTER DISP (DRAPES) ×3 IMPLANT
DRAPE SURG 17X23 STRL (DRAPES) ×3 IMPLANT
DRSG EMULSION OIL 3X3 NADH (GAUZE/BANDAGES/DRESSINGS) IMPLANT
GAUZE SPONGE 4X4 16PLY XRAY LF (GAUZE/BANDAGES/DRESSINGS) IMPLANT
GAUZE XEROFORM 1X8 LF (GAUZE/BANDAGES/DRESSINGS) ×3 IMPLANT
GLOVE BIO SURGEON STRL SZ7.5 (GLOVE) ×6 IMPLANT
GLOVE BIO SURGEON STRL SZ8 (GLOVE) ×3 IMPLANT
GLOVE BIOGEL PI IND STRL 8.5 (GLOVE) ×1 IMPLANT
GLOVE BIOGEL PI INDICATOR 8.5 (GLOVE) ×2
GLOVE INDICATOR 7.5 STRL GRN (GLOVE) ×6 IMPLANT
GLOVE INDICATOR 8.0 STRL GRN (GLOVE) ×3 IMPLANT
GOWN STRL REUS W/ TWL LRG LVL3 (GOWN DISPOSABLE) ×1 IMPLANT
GOWN STRL REUS W/ TWL XL LVL3 (GOWN DISPOSABLE) ×2 IMPLANT
GOWN STRL REUS W/TWL LRG LVL3 (GOWN DISPOSABLE) ×2
GOWN STRL REUS W/TWL XL LVL3 (GOWN DISPOSABLE) ×4
LOOP VESSEL MAXI BLUE (MISCELLANEOUS) IMPLANT
NDL SAFETY ECLIPSE 18X1.5 (NEEDLE) IMPLANT
NEEDLE HYPO 18GX1.5 SHARP (NEEDLE)
NEEDLE HYPO 25X1 1.5 SAFETY (NEEDLE) ×3 IMPLANT
NS IRRIG 500ML POUR BTL (IV SOLUTION) ×3 IMPLANT
PACK BASIN DAY SURGERY FS (CUSTOM PROCEDURE TRAY) ×3 IMPLANT
PAD ALCOHOL SWAB (MISCELLANEOUS) ×12 IMPLANT
PAD CAST 3X4 CTTN HI CHSV (CAST SUPPLIES) ×1 IMPLANT
PAD CAST 4YDX4 CTTN HI CHSV (CAST SUPPLIES) IMPLANT
PADDING CAST ABS 3INX4YD NS (CAST SUPPLIES)
PADDING CAST ABS 4INX4YD NS (CAST SUPPLIES) ×2
PADDING CAST ABS COTTON 3X4 (CAST SUPPLIES) IMPLANT
PADDING CAST ABS COTTON 4X4 ST (CAST SUPPLIES) ×1 IMPLANT
PADDING CAST COTTON 3X4 STRL (CAST SUPPLIES) ×2
PADDING CAST COTTON 4X4 STRL (CAST SUPPLIES)
SPLINT FIBERGLASS 3X35 (CAST SUPPLIES) ×6 IMPLANT
SPLINT FIBERGLASS 4X30 (CAST SUPPLIES) IMPLANT
SPONGE GAUZE 4X4 12PLY STER LF (GAUZE/BANDAGES/DRESSINGS) ×3 IMPLANT
STOCKINETTE 4X48 STRL (DRAPES) ×3 IMPLANT
STRIP CLOSURE SKIN 1/4X4 (GAUZE/BANDAGES/DRESSINGS) ×2 IMPLANT
SUT ETHILON 4 0 PS 2 18 (SUTURE) IMPLANT
SUT MNCRL AB 3-0 PS2 18 (SUTURE) ×3 IMPLANT
SUT MNCRL AB 4-0 PS2 18 (SUTURE) ×3 IMPLANT
SUT MON AB 3-0 SH 27 (SUTURE)
SUT MON AB 3-0 SH27 (SUTURE) IMPLANT
SUT PROLENE 4 0 PS 2 18 (SUTURE) ×3 IMPLANT
SYR 20CC LL (SYRINGE) ×3 IMPLANT
SYR BULB 3OZ (MISCELLANEOUS) ×3 IMPLANT
TAPE STRIPS DRAPE STRL (GAUZE/BANDAGES/DRESSINGS) ×3 IMPLANT
TOWEL OR 17X24 6PK STRL BLUE (TOWEL DISPOSABLE) ×3 IMPLANT
UNDERPAD 30X30 INCONTINENT (UNDERPADS AND DIAPERS) ×3 IMPLANT

## 2014-01-04 NOTE — Discharge Instructions (Signed)
KEEP BANDAGE CLEAN AND DRY CALL OFFICE FOR F/U APPT [id number] in 10 days KEEP HAND ELEVATED ABOVE HEART OK TO APPLY ICE TO OPERATIVE AREA CONTACT OFFICE IF ANY WORSENING PAIN OR CONCERNS.  Call your surgeon if you experience:   1.  Fever over 101.0. 2.  Inability to urinate. 3.  Nausea and/or vomiting. 4.  Extreme swelling or bruising at the surgical site. 5.  Continued bleeding from the incision. 6.  Increased pain, redness or drainage from the incision. 7.  Problems related to your pain medication. 8. Any change in color, movement and/or sensation 9. Any problems and/or concerns Post Anesthesia Home Care Instructions  Activity: Get plenty of rest for the remainder of the day. A responsible adult should stay with you for 24 hours following the procedure.  For the next 24 hours, DO NOT: -Drive a car -Paediatric nurse -Drink alcoholic beverages -Take any medication unless instructed by your physician -Make any legal decisions or sign important papers.  Meals: Start with liquid foods such as gelatin or soup. Progress to regular foods as tolerated. Avoid greasy, spicy, heavy foods. If nausea and/or vomiting occur, drink only clear liquids until the nausea and/or vomiting subsides. Call your physician if vomiting continues.  Special Instructions/Symptoms: Your throat may feel dry or sore from the anesthesia or the breathing tube placed in your throat during surgery. If this causes discomfort, gargle with warm salt water. The discomfort should disappear within 24 hours.

## 2014-01-04 NOTE — H&P (Signed)
[name] is an 78 y.o. female.   Chief Complaint: LEFT WRIST PAIN HPI: PT WITH PERSISTENT PAIN AND SWELLING OVER DORSUM OF LEFT WRIST PT HERE FOR SURGERY ON LEFT WRIST NO PRIOR SURGERY TO LEFT WRIST PT FOLLOWED IN OFFICE  Past Medical History  Diagnosis Date  . Hypertension   . GERD (gastroesophageal reflux disease)   . Hyperlipidemia   . Chronic headaches   . Bruises easily   . History of kidney stones   . CAD (coronary artery disease) CARDIOLOGIST- DR Jenkins [place]-- LAST VISIT IN [facility]  . Heart murmur   . Spinal stenosis, multilevel   . Spondylosis, cervical   . Fibromyalgia   . OSA (obstructive sleep apnea) CPAP INTOLERANT   . H/O hypoglycemia   . Eczema   . Complication of anesthesia     bad reaction to ether  . Hemorrhoids     INTERNAL--  POST BANDING 02/ 2015  . Arthritis   . History of colon polyps   . Diverticulosis of colon   . Hypothyroidism, postsurgical   . History of lower GI bleeding     SECONDARY TO INTERNAL HEMORRHOIDS 02/  2015--  RESOLVED  . History of adenomatous polyp of colon   . Moderate aortic stenosis   . Tenosynovitis of wrist     LEFT    Past Surgical History  Procedure Laterality Date  . Umbilical hernia repair  JUNE 2006  . Vaginal hysterectomy  01-31-2001    ANTERIOR & POSTERIOR REPAIR/ TRANSVAGINAL BLADDER SLING  . Anterior cervical decomp/discectomy fusion  11-11-1999    C5 - C6  . Thyroidectomy  AGE 65    GOITER  . Hemorrhoidectomy with hemorrhoid banding  08-07-2010  . Laparoscopic cholecystectomy  1990  . Appendectomy  AGE 78  . Tonsillectomy and adenoidectomy  AGE 81  . Cataract extraction w/ intraocular lens  implant, bilateral  2012  . Transthoracic echocardiogram  last one 04-20-2013  DR [facility]     NORMAL LVSF/ EF 60-65%/ MODERATE  AV  STENOSIS WITH NO AR /  MILD LAE  . Cardiac catheterization  2008  DR [facility]    ESSENTIALLY NORMAL  . Shoulder arthroscopy with open rotator cuff repair and distal clavicle acrominectomy  Right 05/25/2012    Procedure: SHOULDER ARTHROSCOPY WITH OPEN ROTATOR CUFF REPAIR AND DISTAL CLAVICLE ACROMINECTOMY;  Surgeon: Magnus Sinning, MD;  Location: [place];  Service: Orthopedics;  Laterality: Right;  RIGHT SHOULDER ARTHROSCOPY WITH DERBRIDEMENTOF LABRAL/BICEP TENDON, OPEN DISTAL CLAVICLE RESECTION, ANTERIOR ACROMINECTOMY ROTATOR CUFF REPAIR ANESTHESIA: GENERAL/SCALENE NERVE BLOCK  . Flexible sigmoidoscopy N/A 06/01/2013    Procedure: FLEXIBLE SIGMOIDOSCOPY;  Surgeon: Inda Castle, MD;  Location: [facility];  Service: Endoscopy;  Laterality: N/A;  may need hemorrhoidal banding    Family History  Problem Relation Age of Onset  . Cirrhosis Mother     drinking  . Gallbladder disease Maternal Grandmother   . Other Brother     duodenal ulcer  . Stomach cancer Paternal Grandfather    Social History:  reports that she quit smoking about 10 years ago. Her smoking use included Cigarettes. She smoked 0.25 packs per day. She has never used smokeless tobacco. She reports that she drinks alcohol. She reports that she does not use illicit drugs.  Allergies:  Allergies  Allergen Reactions  . Tape Other (See Comments)    Dermatitis rash "with extended exposure"    Medications Prior to Admission  Medication Sig Dispense Refill  . aspirin  81 MG tablet Take 81 mg by mouth daily.       Marland Kitchen atorvastatin (LIPITOR) 80 MG tablet Take 80 mg by mouth every morning.       . Calcium-Vitamin D 600-125 MG-UNIT TABS Take 1 tablet by mouth at bedtime.       . cycloSPORINE (RESTASIS) 0.05 % ophthalmic emulsion Place 1 drop into both eyes 2 (two) times daily as needed.       . gabapentin (NEURONTIN) 600 MG tablet TAKE 1 TABLET (600 MG TOTAL)  BY MOUTH 3 (THREE) TIMES DAILY.  90 tablet  4  . metoprolol (LOPRESSOR) 50 MG tablet Take 1 tablet (50 mg total) by mouth 2 (two) times daily.  180 tablet  1  . omeprazole (PRILOSEC) 20 MG capsule TAKE 1 CAPSULE (20 MG TOTAL)  BY MOUTH EVERY  EVENING.  90 capsule  0  . polyethylene glycol powder (GLYCOLAX/MIRALAX) powder Take 1 Container by mouth as needed.      . Vitamin D, Ergocalciferol, (DRISDOL) 50000 UNITS CAPS capsule Take 50,000 Units by mouth every 7 (seven) days. SUNDAYS        No results found for this or any previous visit (from the past 48 hour(s)). No results found.  ROS NO RECENT ILLNESSES OR HOSPITALIZATIONS  Height 5\' 2"  (1.575 m), weight 72.576 kg (160 lb). Physical Exam  General Appearance:  Alert, cooperative, no distress, appears stated age  Head:  Normocephalic, without obvious abnormality, atraumatic  Eyes:  Pupils equal, conjunctiva/corneas clear,         Throat: Lips, mucosa, and tongue normal; teeth and gums normal  Neck: No visible masses     Lungs:   respirations unlabored  Chest Wall:  No tenderness or deformity  Heart:  Regular rate and rhythm,  Abdomen:   Soft, non-tender,         Extremities: LEFT WRIST: SWELLING OVER FOURTH DORSAL COMPARTMENT, FINGERS WARM WELL PERFUSED ABLE TO EXTEND THUMB AND DIGITS  Pulses: 2+ and symmetric  Skin: Skin color, texture, turgor normal, no rashes or lesions     Neurologic: Normal    Assessment/Plan LEFT WRIST FOURTH DORSAL COMPARTMENT EXTENSOR TENOSYNOVITIS  LEFT WRIST FOURTH DORSAL COMPARTMENT TENOSYNOVECTOMY  R/B/A DISCUSSED WITH PT IN OFFICE.  PT VOICED UNDERSTANDING OF PLAN CONSENT SIGNED DAY OF SURGERY PT SEEN AND EXAMINED PRIOR TO OPERATIVE PROCEDURE/DAY OF SURGERY SITE MARKED. QUESTIONS ANSWERED WILL GO HOME FOLLOWING SURGERY  WE ARE PLANNING SURGERY FOR YOUR UPPER EXTREMITY. THE RISKS AND BENEFITS OF SURGERY INCLUDE BUT NOT LIMITED TO BLEEDING INFECTION, DAMAGE TO NEARBY NERVES ARTERIES TENDONS, FAILURE OF SURGERY TO ACCOMPLISH ITS INTENDED GOALS, PERSISTENT SYMPTOMS AND NEED FOR FURTHER SURGICAL INTERVENTION. WITH THIS IN MIND WE WILL PROCEED. I HAVE DISCUSSED WITH THE PATIENT THE PRE AND POSTOPERATIVE REGIMEN AND THE DOS AND DON'TS.  PT VOICED UNDERSTANDING AND INFORMED CONSENT SIGNED.  Linna Hoff 01/04/2014, 1:09 PM

## 2014-01-04 NOTE — Anesthesia Preprocedure Evaluation (Addendum)
Anesthesia Evaluation  Patient identified by MRN, date of birth, ID band Patient awake    Reviewed: Allergy & Precautions, H&P , NPO status , Patient's Chart, lab work & pertinent test results, reviewed documented beta blocker date and time   History of Anesthesia Complications Negative for: history of anesthetic complications  Airway Mallampati: II TM Distance: >3 FB Neck ROM: Limited    Dental  (+) Dental Advisory Given, Teeth Intact, Caps   Pulmonary neg pulmonary ROS, sleep apnea , former smoker,  breath sounds clear to auscultation  Pulmonary exam normal       Cardiovascular hypertension, Pt. on medications and Pt. on home beta blockers + CAD and + Peripheral Vascular Disease + Valvular Problems/Murmurs AS Rhythm:Regular Rate:Normal     Neuro/Psych  Headaches, negative psych ROS   GI/Hepatic Neg liver ROS, GERD-  Medicated,  Endo/Other  negative endocrine ROS  Renal/GU negative Renal ROS     Musculoskeletal negative musculoskeletal ROS (+) Arthritis -, Fibromyalgia -, narcotic dependent  Abdominal   Peds  Hematology negative hematology ROS (+)   Anesthesia Other Findings Full upper crowns w/ 2 teeth with hairline fractures. Dental Advisory given.  Reproductive/Obstetrics                       Anesthesia Physical  Anesthesia Plan  ASA: III  Anesthesia Plan: General   Post-op Pain Management:    Induction: Intravenous  Airway Management Planned: LMA  Additional Equipment:   Intra-op Plan:   Post-operative Plan:   Informed Consent: I have reviewed the patients History and Physical, chart, labs and discussed the procedure including the risks, benefits and alternatives for the proposed anesthesia with the patient or authorized representative who has indicated his/her understanding and acceptance.   Dental advisory given  Plan Discussed with: CRNA  Anesthesia Plan Comments:          Anesthesia Quick Evaluation

## 2014-01-04 NOTE — Anesthesia Procedure Notes (Signed)
Procedure Name: LMA Insertion Date/Time: 01/04/2014 2:38 PM Performed by: Wanita Chamberlain Pre-anesthesia Checklist: Patient identified, Emergency Drugs available, Suction available, Patient being monitored and Timeout performed Patient Re-evaluated:Patient Re-evaluated prior to inductionOxygen Delivery Method: Circle system utilized Preoxygenation: Pre-oxygenation with 100% oxygen Intubation Type: IV induction LMA: LMA inserted LMA Size: 4.0 Number of attempts: 1 Airway Equipment and Method: Bite block Placement Confirmation: positive ETCO2 Tube secured with: Tape

## 2014-01-04 NOTE — Transfer of Care (Signed)
Immediate Anesthesia Transfer of Care Note  Patient: [name]  Procedure(s) Performed: Procedure(s): LEFT WRIST EXTENSOR TENOSYNOVECTOMY (Left)  Patient Location: PACU  Anesthesia Type:General  Level of Consciousness: awake, alert , oriented and patient cooperative  Airway & Oxygen Therapy: Patient Spontanous Breathing and Patient connected to nasal cannula oxygen  Post-op Assessment: Report given to PACU RN and Post -op Vital signs reviewed and stable  Post vital signs: Reviewed and stable  Complications: No apparent anesthesia complications

## 2014-01-04 NOTE — Brief Op Note (Signed)
01/04/2014  1:11 PM  PATIENT:  [name]  78 y.o. female  PRE-OPERATIVE DIAGNOSIS:  LEFT WRIST EXTENSOR TENDOSYNOVITIS  POST-OPERATIVE DIAGNOSIS:  * No post-op diagnosis entered *  PROCEDURE:  Procedure(s): LEFT WRIST EXTENSOR TENOSYNOVECTOMY (Left)  SURGEON:  Surgeon(s) and Role:    * Linna Hoff, MD - Primary  PHYSICIAN ASSISTANT:   ASSISTANTS: none   ANESTHESIA:   general  EBL:     BLOOD ADMINISTERED:none  DRAINS: none   LOCAL MEDICATIONS USED:  MARCAINE     SPECIMEN:  No Specimen  DISPOSITION OF SPECIMEN:  N/A  COUNTS:  YES  TOURNIQUET:  * No tourniquets in log *  DICTATION: [id number]  PLAN OF CARE: Discharge to home after PACU  PATIENT DISPOSITION:  PACU - hemodynamically stable.   Delay start of Pharmacological VTE agent (>24hrs) due to surgical blood loss or risk of bleeding: not applicable

## 2014-01-05 ENCOUNTER — Encounter (HOSPITAL_BASED_OUTPATIENT_CLINIC_OR_DEPARTMENT_OTHER): Payer: Self-pay | Admitting: Orthopedic Surgery

## 2014-01-05 NOTE — Op Note (Unsigned)
NAMEMARTINA, BRODBECK                   ACCOUNT NO.:  [id number]  MEDICAL RECORD NO.:  [id number]  LOCATION:                                 FACILITY:  PHYSICIAN:  Melrose Nakayama, MD  DATE OF BIRTH:  06-10-1935  DATE OF PROCEDURE:  01/04/2014 DATE OF DISCHARGE:  01/04/2014                              OPERATIVE REPORT   PREOPERATIVE DIAGNOSIS:  Left wrist fourth dorsal compartment extensor tenosynovitis.  POSTOPERATIVE DIAGNOSIS:  Left wrist fourth dorsal compartment extensor tenosynovitis.  ATTENDING PHYSICIAN:  Melrose Nakayama, MD, who was scrubbed and present for the entire procedure.  ASSISTANT SURGEON:  None.  ANESTHESIA:  General.   Dictation ended at this point.     Melrose Nakayama, MD     FWO/MEDQ  D:  01/04/2014  T:  01/05/2014  Job:  [id number]

## 2014-01-08 NOTE — Anesthesia Postprocedure Evaluation (Signed)
  Anesthesia Post-op Note  Patient: [name]  Procedure(s) Performed: Procedure(s) (LRB): LEFT WRIST EXTENSOR TENOSYNOVECTOMY (Left)  Patient Location: PACU  Anesthesia Type: General  Level of Consciousness: awake and alert   Airway and Oxygen Therapy: Patient Spontanous Breathing  Post-op Pain: mild  Post-op Assessment: Post-op Vital signs reviewed, Patient's Cardiovascular Status Stable, Respiratory Function Stable, Patent Airway and No signs of Nausea or vomiting  Last Vitals:  Filed Vitals:   01/04/14 1641  BP: 142/55  Pulse: 59  Temp: 36.9 C  Resp: 18    Post-op Vital Signs: stable   Complications: No apparent anesthesia complications

## 2014-01-10 ENCOUNTER — Other Ambulatory Visit: Payer: Self-pay

## 2014-01-10 MED ORDER — GABAPENTIN 600 MG PO TABS
ORAL_TABLET | ORAL | Status: DC
Start: 2014-01-10 — End: 2014-02-13

## 2014-01-12 ENCOUNTER — Encounter: Payer: Self-pay | Admitting: Internal Medicine

## 2014-01-12 NOTE — Op Note (Signed)
[name], [name]                   ACCOUNT NO.:  [id number]  MEDICAL RECORD NO.:  [id number]  LOCATION:                                 FACILITY:  PHYSICIAN:  Melrose Nakayama, MD  DATE OF BIRTH:  [birth year]  DATE OF PROCEDURE:  01/04/2014 DATE OF DISCHARGE:  01/04/2014                              OPERATIVE REPORT   PREOPERATIVE DIAGNOSIS:  Left wrist fourth dorsal compartment extensor tenosynovitis.  POSTOPERATIVE DIAGNOSIS:  Left wrist fourth dorsal compartment extensor tenosynovitis.  ATTENDING PHYSICIAN:  Melrose Nakayama, MD who scrubbed and present for the entire procedure.  ASSISTANT SURGEON:  None.  ANESTHESIA:  General via LMA.  SURGICAL PROCEDURE:  Left wrist fourth dorsal compartment extensor tenosynovectomy.  SURGICAL SPECIMENS:  Tenosynovitis to pathology.  SURGICAL INDICATIONS:  [name] is a right-hand-dominant female with signs and symptoms of fourth dorsal compartment tenosynovitis.  The patient would like to undergo the above procedure.  Risks, benefits, and alternatives were discussed in detail with the patient and signed informed consent was obtained.  Risks include, but not limited to bleeding, infection, damage to nearby nerves, arteries, or tendons, loss of motion of wrist and digits, incomplete relief of symptoms, and need for further surgical intervention.  DESCRIPTION OF PROCEDURE:  The patient was properly identified in the preop holding area, marked with permanent marker on left wrist to indicate the correct operative site.  The patient was then brought back to the operating room, placed supine on the anesthesia room table and general anesthesia was administered.  The patient tolerated this well. A well-padded tourniquet was placed on left brachium sealed with 1000 drape.  Left upper extremity was then prepped and draped in normal sterile fashion.  Time-out was called, correct site was identified, and procedure then begun.  Attention then  turned to the left wrist, where a longitudinal incision made directly over the fourth dorsal compartment. Limb had been elevated, tourniquet insufflated.  Dissection was carried down through the skin and subcutaneous tissue and the retinaculum distally was then opened up in the fourth dorsal compartment.  Tendons were then carefully identified.  Aggressive tenosynovectomy of the EIP as well as the EDC to the index, long, ring, and small and then carefully down.  The patient did have a moderate to large amount of inflammatory change along the course of the tendons and tendon tissue was then carefully removed.  After tenosynovectomy, the portion of the retinaculum proximally was then repaired with a Monocryl suture.  The subcutaneous tissues closed with Monocryl and skin closed with running 4- 0 Prolene.  Benzoin and Steri-Strips were applied.  Sterile compressive bandage was applied.  The patient was placed in a short-arm splint and slightly wrist extension, extubated and taken to recovery room in good condition.  POSTPROCEDURE PLAN:  The patient was discharged home, seen back in the office in approximately 2 weeks for wound check, suture removal, transition of short-arm wrist brace and then gradually use an activity, and go over the pathology.     Melrose Nakayama, MD     FWO/MEDQ  D:  01/11/2014  T:  01/11/2014  Job:  [id number]

## 2014-02-12 ENCOUNTER — Encounter (HOSPITAL_BASED_OUTPATIENT_CLINIC_OR_DEPARTMENT_OTHER): Payer: Self-pay | Admitting: Orthopedic Surgery

## 2014-02-13 ENCOUNTER — Other Ambulatory Visit: Payer: Self-pay | Admitting: *Deleted

## 2014-02-13 MED ORDER — GABAPENTIN 600 MG PO TABS: ORAL_TABLET | ORAL | Status: DC

## 2014-02-13 NOTE — Telephone Encounter (Signed)
Patient of [name]. Last seen in office on 10-31-13. Please advise on refill

## 2014-02-13 NOTE — Telephone Encounter (Signed)
no more refills without being seen  

## 2014-02-16 ENCOUNTER — Other Ambulatory Visit: Payer: Self-pay | Admitting: *Deleted

## 2014-02-16 DIAGNOSIS — K219 Gastro-esophageal reflux disease without esophagitis: Secondary | ICD-10-CM

## 2014-02-16 MED ORDER — OMEPRAZOLE 20 MG PO CPDR: DELAYED_RELEASE_CAPSULE | ORAL | Status: DC

## 2014-03-02 ENCOUNTER — Other Ambulatory Visit: Payer: Self-pay | Admitting: Family

## 2014-03-14 ENCOUNTER — Other Ambulatory Visit: Payer: Self-pay | Admitting: Nurse Practitioner

## 2014-03-14 NOTE — Telephone Encounter (Signed)
Last ov 7/15. 

## 2014-03-15 NOTE — Telephone Encounter (Signed)
Please review

## 2014-03-28 ENCOUNTER — Other Ambulatory Visit (HOSPITAL_COMMUNITY): Payer: Medicare Other

## 2014-03-28 ENCOUNTER — Ambulatory Visit: Payer: Medicare Other | Admitting: Cardiovascular Disease

## 2014-04-26 ENCOUNTER — Ambulatory Visit: Payer: Medicare Other | Admitting: Cardiovascular Disease

## 2014-04-27 ENCOUNTER — Other Ambulatory Visit: Payer: Self-pay | Admitting: Family

## 2014-05-03 ENCOUNTER — Ambulatory Visit: Payer: Medicare Other | Admitting: Family

## 2014-05-03 ENCOUNTER — Ambulatory Visit (HOSPITAL_COMMUNITY): Payer: 59

## 2014-05-03 ENCOUNTER — Ambulatory Visit: Payer: Medicare Other | Admitting: Cardiovascular Disease

## 2014-05-14 ENCOUNTER — Other Ambulatory Visit: Payer: Self-pay | Admitting: Family Medicine

## 2014-05-16 ENCOUNTER — Other Ambulatory Visit: Payer: Self-pay | Admitting: Family

## 2014-05-20 NOTE — Progress Notes (Signed)
Patient ID: [name], female   DOB: March 19, 1936, 79 y.o.   MRN: [id number] [name] is seen today in followup for chest pain aortic stenosis and hypercholesterolemia. His been doing well. She has arthritis and fibromyalgia.She had an essentially normal heart cath in 2008 and a normal Myoview 2009. Her EKGs have been normal. Don't think her current chest pains or angina. She has moderate  aortic stenosis.  Her dentition is in good shape. He is not any palpitations PND orthopnea his been no syncope lower extremity edema or fever of unknown origin.  Echo 1/15  Moderate AS   - Left ventricle: The cavity size was normal. Systolic function was normal. The estimated ejection fraction was in the range of 55% to 60%. Wall motion was normal; there were no regional wall motion abnormalities. - Aortic valve: Cusp separation was moderately reduced. There was moderate stenosis. Mean gradient: 58mm Hg (S). Peak gradient: 65mm Hg (S). - Mitral valve: Calcified annulus. - Left atrium: The atrium was mildly dilated. Impressions:  - Aortic stenosis has progressed from 10/13. Recommend  Needs f/u echo Has myalgias weakness and some nausea.  Some SSCP radiating to left shoulder and arm not exertional.      ROS: Denies fever, malais, weight loss, blurry vision, decreased visual acuity, cough, sputum, SOB, hemoptysis, pleuritic pain, palpitaitons, heartburn, abdominal pain, melena, lower extremity edema, claudication, or rash.  All other systems reviewed and negative  General: Affect appropriate Healthy:  appears stated age 12: normal Neck supple with no adenopathy JVP normal no bruits no thyromegaly Lungs clear with no wheezing and good diaphragmatic motion Heart:  S1/S2  Preserved AS  murmur, no rub, gallop or click PMI normal Abdomen: benighn, BS positve, no tenderness, no AAA no bruit.  No HSM or HJR Distal pulses intact with no bruits No edema Neuro non-focal Skin warm and dry No muscular  weakness   Current Outpatient Prescriptions  Medication Sig Dispense Refill  . aspirin 81 MG tablet Take 81 mg by mouth daily.     Marland Kitchen atorvastatin (LIPITOR) 80 MG tablet Take 80 mg by mouth every morning.     . Calcium-Vitamin D 600-125 MG-UNIT TABS Take 1 tablet by mouth at bedtime.     . cycloSPORINE (RESTASIS) 0.05 % ophthalmic emulsion Place 1 drop into both eyes 2 (two) times daily as needed.     . docusate sodium (COLACE) 100 MG capsule Take 1 capsule (100 mg total) by mouth 2 (two) times daily. 30 capsule 0  . gabapentin (NEURONTIN) 600 MG tablet TAKE ONE TABLET BY MOUTH THREE TIMES DAILY 90 tablet 0  . Hydrocodone-Acetaminophen (VICODIN) 5-300 MG TABS Take 1 tablet by mouth 4 (four) times daily as needed (PAIN). 25 each 0  . metoprolol (LOPRESSOR) 50 MG tablet TAKE ONE TABLET BY MOUTH TWICE DAILY 180 tablet 0  . omeprazole (PRILOSEC) 20 MG capsule TAKE 1 CAPSULE (20 MG TOTAL)   BY MOUTH EVERY EVENING. 90 capsule 0  . polyethylene glycol powder (GLYCOLAX/MIRALAX) powder Take 1 Container by mouth as needed.    . Vitamin D, Ergocalciferol, (DRISDOL) 50000 UNITS CAPS capsule Take 50,000 Units by mouth every 7 (seven) days. SUNDAYS     No current facility-administered medications for this visit.    Allergies  Tape  Electrocardiogram: 2.17  SR normal ECG  05/21/14  SR rate 59  Normal   Assessment and Plan

## 2014-05-21 ENCOUNTER — Encounter: Payer: Self-pay | Admitting: Cardiovascular Disease

## 2014-05-21 ENCOUNTER — Ambulatory Visit (INDEPENDENT_AMBULATORY_CARE_PROVIDER_SITE_OTHER): Payer: 59 | Admitting: Cardiovascular Disease

## 2014-05-21 VITALS — BP 128/64 | HR 59 | Ht 62.0 in | Wt 165.0 lb

## 2014-05-21 DIAGNOSIS — E7800 Pure hypercholesterolemia, unspecified: Secondary | ICD-10-CM

## 2014-05-21 DIAGNOSIS — E785 Hyperlipidemia, unspecified: Secondary | ICD-10-CM

## 2014-05-21 DIAGNOSIS — I10 Essential (primary) hypertension: Secondary | ICD-10-CM

## 2014-05-21 DIAGNOSIS — R072 Precordial pain: Secondary | ICD-10-CM | POA: Diagnosis not present

## 2014-05-21 DIAGNOSIS — I350 Nonrheumatic aortic (valve) stenosis: Secondary | ICD-10-CM | POA: Diagnosis not present

## 2014-05-21 NOTE — Assessment & Plan Note (Signed)
Cholesterol is at goal.  Continue current dose of statin and diet Rx.  No myalgias or side effects.  F/U  LFT's in 6 months. Lab Results  Component Value Date   LDLCALC 73 10/31/2013

## 2014-05-21 NOTE — Assessment & Plan Note (Signed)
S2 still preserved  Echo to r/o progression

## 2014-05-21 NOTE — Assessment & Plan Note (Signed)
Well controlled.  Continue current medications and low sodium Dash type diet.    

## 2014-05-21 NOTE — Patient Instructions (Addendum)

## 2014-05-21 NOTE — Assessment & Plan Note (Signed)
Some atypical features especially being non exertional.  Echo to r/o severe AS  If no progression consider myovue or cardiac CT  If AS is severe will need right and left heart cath

## 2014-05-24 ENCOUNTER — Ambulatory Visit (HOSPITAL_COMMUNITY): Payer: Medicare Other | Attending: Cardiology

## 2014-05-24 DIAGNOSIS — I350 Nonrheumatic aortic (valve) stenosis: Secondary | ICD-10-CM | POA: Insufficient documentation

## 2014-05-24 NOTE — Progress Notes (Signed)
2D Echo completed. 05/24/2014

## 2014-05-29 ENCOUNTER — Ambulatory Visit: Payer: Self-pay | Admitting: Family

## 2014-06-13 ENCOUNTER — Other Ambulatory Visit: Payer: Self-pay | Admitting: Family

## 2014-07-12 ENCOUNTER — Other Ambulatory Visit: Payer: Self-pay | Admitting: Family

## 2014-07-13 NOTE — Telephone Encounter (Signed)
Patient last seen in office on 10-31-13. Notified at last RF that NTBS. Please advise on RF

## 2014-07-26 ENCOUNTER — Encounter: Payer: Self-pay | Admitting: *Deleted

## 2014-07-26 ENCOUNTER — Ambulatory Visit (INDEPENDENT_AMBULATORY_CARE_PROVIDER_SITE_OTHER): Payer: Medicare Other | Admitting: *Deleted

## 2014-07-26 ENCOUNTER — Ambulatory Visit (INDEPENDENT_AMBULATORY_CARE_PROVIDER_SITE_OTHER): Payer: Medicare Other

## 2014-07-26 VITALS — BP 116/58 | HR 58 | Ht 62.25 in | Wt 159.0 lb

## 2014-07-26 DIAGNOSIS — Z780 Asymptomatic menopausal state: Secondary | ICD-10-CM | POA: Diagnosis not present

## 2014-07-26 DIAGNOSIS — Z0100 Encounter for examination of eyes and vision without abnormal findings: Secondary | ICD-10-CM | POA: Diagnosis not present

## 2014-07-26 DIAGNOSIS — Z0000 Encounter for general adult medical examination without abnormal findings: Secondary | ICD-10-CM

## 2014-07-26 NOTE — Progress Notes (Signed)
Subjective:   [name] is a 79 y.o. female who presents for an Initial Medicare Annual Wellness Visit. [name] lives at home with her husband. Recently her activity level has decreased due to right knee pain and swelling. She and her husband have 6 children and many grandchildren and she really enjoys her family life.  Review of Systems     Cardiac Risk Factors include: advanced age (>38men, >14 women);dyslipidemia;hypertension;sedentary lifestyle   Musculoskeletal: Right knee pain, swelling, and crepitus.  Other systems negative.      Objective:    Today's Vitals   07/26/14 1046  BP: 116/58  Pulse: 58  Height: 5' 2.25" (1.581 m)  Weight: 159 lb (72.122 kg)  BMI                 28.85  Current Medications (verified) Outpatient Encounter Prescriptions as of 07/26/2014  Medication Sig  . aspirin 81 MG tablet Take 81 mg by mouth daily.   Marland Kitchen atorvastatin (LIPITOR) 80 MG tablet Take 80 mg by mouth every morning.   . Calcium-Vitamin D 600-125 MG-UNIT TABS Take 1 tablet by mouth at bedtime.   . gabapentin (NEURONTIN) 600 MG tablet TAKE ONE TABLET BY MOUTH THREE TIMES DAILY  . metoprolol (LOPRESSOR) 50 MG tablet TAKE ONE TABLET BY MOUTH TWICE DAILY  . omeprazole (PRILOSEC) 20 MG capsule TAKE 1 CAPSULE (20 MG TOTAL)   BY MOUTH EVERY EVENING.  . Vitamin D, Ergocalciferol, (DRISDOL) 50000 UNITS CAPS capsule Take 50,000 Units by mouth every 7 (seven) days. SUNDAYS  . cycloSPORINE (RESTASIS) 0.05 % ophthalmic emulsion Place 1 drop into both eyes 2 (two) times daily as needed.   . [DISCONTINUED] docusate sodium (COLACE) 100 MG capsule Take 1 capsule (100 mg total) by mouth 2 (two) times daily.  . [DISCONTINUED] Hydrocodone-Acetaminophen (VICODIN) 5-300 MG TABS Take 1 tablet by mouth 4 (four) times daily as needed (PAIN).  . [DISCONTINUED] polyethylene glycol powder (GLYCOLAX/MIRALAX) powder Take 1 Container by mouth as needed.    Allergies (verified) Tape   History: Past Medical  History  Diagnosis Date  . Hypertension   . GERD (gastroesophageal reflux disease)   . Hyperlipidemia   . Chronic headaches   . Bruises easily   . History of kidney stones   . CAD (coronary artery disease) CARDIOLOGIST- DR Jenkins [place]-- LAST VISIT IN [facility]  . Heart murmur   . Spinal stenosis, multilevel   . Spondylosis, cervical   . Fibromyalgia   . OSA (obstructive sleep apnea) CPAP INTOLERANT   . H/O hypoglycemia   . Eczema   . Complication of anesthesia     bad reaction to ether  . Hemorrhoids     INTERNAL--  POST BANDING 02/ 2015  . Arthritis   . History of colon polyps   . Diverticulosis of colon   . Hypothyroidism, postsurgical   . History of lower GI bleeding     SECONDARY TO INTERNAL HEMORRHOIDS 02/  2015--  RESOLVED  . History of adenomatous polyp of colon   . Moderate aortic stenosis   . Tenosynovitis of wrist     LEFT  . Cataracts, bilateral    Past Surgical History  Procedure Laterality Date  . Umbilical hernia repair  JUNE 2006  . Vaginal hysterectomy  01-31-2001    ANTERIOR & POSTERIOR REPAIR/ TRANSVAGINAL BLADDER SLING  . Anterior cervical decomp/discectomy fusion  11-11-1999    C5 - C6  . Thyroidectomy  AGE 57    GOITER  .  Hemorrhoidectomy with hemorrhoid banding  08-07-2010  . Laparoscopic cholecystectomy  1990  . Appendectomy  AGE 53  . Tonsillectomy and adenoidectomy  AGE 40  . Cataract extraction w/ intraocular lens  implant, bilateral  2012  . Transthoracic echocardiogram  last one 04-20-2013  DR [facility]     NORMAL LVSF/ EF 60-65%/ MODERATE  AV  STENOSIS WITH NO AR /  MILD LAE  . Cardiac catheterization  2008  DR [facility]    ESSENTIALLY NORMAL  . Shoulder arthroscopy with open rotator cuff repair and distal clavicle acrominectomy Right 05/25/2012    Procedure: SHOULDER ARTHROSCOPY WITH OPEN ROTATOR CUFF REPAIR AND DISTAL CLAVICLE ACROMINECTOMY;  Surgeon: Magnus Sinning, MD;  Location: [facility];  Service: Orthopedics;   Laterality: Right;  RIGHT SHOULDER ARTHROSCOPY WITH DERBRIDEMENTOF LABRAL/BICEP TENDON, OPEN DISTAL CLAVICLE RESECTION, ANTERIOR ACROMINECTOMY ROTATOR CUFF REPAIR ANESTHESIA: GENERAL/SCALENE NERVE BLOCK  . Flexible sigmoidoscopy N/A 06/01/2013    Procedure: FLEXIBLE SIGMOIDOSCOPY;  Surgeon: Inda Castle, MD;  Location: [facility];  Service: Endoscopy;  Laterality: N/A;  may need hemorrhoidal banding  . Tenosynovectomy Left 01/04/2014    Procedure: LEFT WRIST EXTENSOR TENOSYNOVECTOMY;  Surgeon: Linna Hoff, MD;  Location: [facility];  Service: Orthopedics;  Laterality: Left;   Family History  Problem Relation Age of Onset  . Cirrhosis Mother     drinking  . Gallbladder disease Maternal Grandmother   . Other Brother     duodenal ulcer  . Stomach cancer Paternal Grandfather   . Cancer Son 84    colon   Social History   Occupational History  . Not on file.   Social History Main Topics  . Smoking status: Former Smoker -- 0.25 packs/day    Types: Cigarettes    Quit date: 04/14/2003  . Smokeless tobacco: Never Used  . Alcohol Use: Yes     Comment: once in a while-social  . Drug Use: No  . Sexual Activity: Not on file    Activities of Daily Living In your present state of health, do you have any difficulty performing the following activities: 07/26/2014 01/04/2014  Hearing? N N  Vision? Y N  Difficulty concentrating or making decisions? N N  Walking or climbing stairs? Y N  Dressing or bathing? N N  Doing errands, shopping? N -  Preparing Food and eating ? N -  Using the Toilet? N -  In the past six months, have you accidently leaked urine? N -  Do you have problems with loss of bowel control? N -  Managing your Medications? N -  Managing your Finances? N -  Housekeeping or managing your Housekeeping? N -   Difficulty climbing stairs due to right knee pain.  Immunizations and Health Maintenance Immunization History  Administered Date(s) Administered    . Influenza,inj,Quad PF,36+ Mos 02/01/2013   Health Maintenance Due  Topic Date Due  . DEXA SCAN  09/04/2002  . MAMMOGRAM  03/11/2013  Dexa scan done today and mammogram scheduled.  Patient Care Team: Sharion Balloon, FNP as PCP - General (Nurse Practitioner) Chipper Herb, MD as Attending Physician (Family Medicine) [name], MD as Consulting Physician (Cardiology) Inda Castle, MD as Consulting Physician (Gastroenterology) Magnus Sinning, MD as Consulting Physician (Orthopedic Surgery) Clearnce Sorrel, MD as Referring Physician (Neurology) Rutherford Guys, MD as consulting physician (opthalmology)     Assessment:   This is a routine wellness examination for [name].   Hearing/Vision screen No hearing or  vision deficits noted during visit.   Dietary issues and exercise activities discussed: Current Exercise Habits:: The patient does not participate in regular exercise at present. Exercise is limited by back pain and right knee pain/swelling.   Goals: Once knee pain has improved goal is to walk at least 30 minutes 3 times per week.   Depression Screen PHQ 2/9 Scores 07/26/2014 02/17/2013 09/07/2012  PHQ - 2 Score 0 0 0    Fall Risk Fall Risk  07/26/2014 02/17/2013 09/07/2012  Falls in the past year? No No Yes  Risk for fall due to : History of fall(s) - History of fall(s)  Risk for fall due to (comments): fell a few years ago and broke her right ankle and right rotator cuff - patient slipped on wet grass back in December2013    Cognitive Function: MMSE - Mini Mental State Exam 07/26/2014  Orientation to time 5  Orientation to Place 5  Registration 3  Attention/ Calculation 5  Recall 3  Language- name 2 objects 2  Language- repeat 1  Language- follow 3 step command 3  Language- read & follow direction 1  Write a sentence 1  Copy design 1  Total score 30    Screening Tests Health Maintenance  Topic Date Due  . DEXA SCAN  09/04/2002  . MAMMOGRAM  03/11/2013   . TETANUS/TDAP  01/25/2015 (Originally 09/03/1954)  . PNA vac Low Risk Adult (1 of 2 - PCV13) 01/25/2015 (Originally 09/04/2002)  . ZOSTAVAX  11/25/2015 (Originally 09/03/1997)  . INFLUENZA VACCINE  11/12/2014  . PAP SMEAR  02/18/2015  . COLONOSCOPY  01/16/2020   Dexa scan was performed during today's visit. Mammogram scheduled.   Plan:  Keep appt for mammogram on 08/06/14.  Keep appt with Evelina Dun, FNP on 07/30/14 to evaluate knee pain/swelling. Increase activity level as tolerated. Referral to Dr Gershon Crane placed. Appt scheduled for 08/02/14. Request copy of report be sent to our office.   During the course of the visit, Heran was educated and counseled about the following appropriate screening and preventive services:   Vaccines to include Pneumoccal, Influenza, Tdap, Zostavax. Vaccinations discussed with patient but she declined them at this time.  Electrocardiogram-done 05/2014  Cardiovascular disease screening-Followed by [name]  Colorectal cancer screening-Colonoscopy in 2011. FOBT given today.   Bone density screening-Done today  Diabetes screening-glucose tested 10/2013  Glaucoma screening-Appt scheduled for 08/02/14.  Mammography-scheduled for 08/06/14  Nutrition counseling-balanced diet to include lean proteins, fruits, and vegetables   Patient Instructions (the written plan) were given to the patient.    Chong Sicilian, RN     07/26/2014     I have reviewed and agree with the above AWV documentation.  [name], M.D.

## 2014-07-26 NOTE — Patient Instructions (Addendum)
Mammogram scheduled at mobile unit for Monday 08/06/14 at 1:45  Health Maintenance Adopting a healthy lifestyle and getting preventive care can go a long way to promote health and wellness. Talk with your health care provider about what schedule of regular examinations is right for you. This is a good chance for you to check in with your provider about disease prevention and staying healthy. In between checkups, there are plenty of things you can do on your own. Experts have done a lot of research about which lifestyle changes and preventive measures are most likely to keep you healthy. Ask your health care provider for more information. WEIGHT AND DIET  Eat a healthy diet  Be sure to include plenty of vegetables, fruits, low-fat dairy products, and lean protein.  Do not eat a lot of foods high in solid fats, added sugars, or salt.  Get regular exercise. This is one of the most important things you can do for your health.  Most adults should exercise for at least 150 minutes each week. The exercise should increase your heart rate and make you sweat (moderate-intensity exercise).  Most adults should also do strengthening exercises at least twice a week. This is in addition to the moderate-intensity exercise.  Maintain a healthy weight  Body mass index (BMI) is a measurement that can be used to identify possible weight problems. It estimates body fat based on height and weight. Your health care provider can help determine your BMI and help you achieve or maintain a healthy weight.  For females 50 years of age and older:   A BMI below 18.5 is considered underweight.  A BMI of 18.5 to 24.9 is normal.  A BMI of 25 to 29.9 is considered overweight.  A BMI of 30 and above is considered obese.  Watch levels of cholesterol and blood lipids  You should start having your blood tested for lipids and cholesterol at 77 years of age, then have this test every 5 years.  You may need to have your  cholesterol levels checked more often if:  Your lipid or cholesterol levels are high.  You are older than 77 years of age.  You are at high risk for heart disease.  CANCER SCREENING   Lung Cancer  Lung cancer screening is recommended for adults 32-12 years old who are at high risk for lung cancer because of a history of smoking.  A yearly low-dose CT scan of the lungs is recommended for people who:  Currently smoke.  Have quit within the past 15 years.  Have at least a 30-pack-year history of smoking. A pack year is smoking an average of one pack of cigarettes a day for 1 year.  Yearly screening should continue until it has been 15 years since you quit.  Yearly screening should stop if you develop a health problem that would prevent you from having lung cancer treatment.  Breast Cancer  Practice breast self-awareness. This means understanding how your breasts normally appear and feel.  It also means doing regular breast self-exams. Let your health care provider know about any changes, no matter how small.  If you are in your 20s or 30s, you should have a clinical breast exam (CBE) by a health care provider every 1-3 years as part of a regular health exam.  If you are 18 or older, have a CBE every year. Also consider having a breast X-ray (mammogram) every year.  If you have a family history of breast cancer, talk to  your health care provider about genetic screening.  If you are at high risk for breast cancer, talk to your health care provider about having an MRI and a mammogram every year.  Breast cancer gene (BRCA) assessment is recommended for women who have family members with BRCA-related cancers. BRCA-related cancers include:  Breast.  Ovarian.  Tubal.  Peritoneal cancers.  Results of the assessment will determine the need for genetic counseling and BRCA1 and BRCA2 testing. Cervical Cancer Routine pelvic examinations to screen for cervical cancer are no  longer recommended for nonpregnant women who are considered low risk for cancer of the pelvic organs (ovaries, uterus, and vagina) and who do not have symptoms. A pelvic examination may be necessary if you have symptoms including those associated with pelvic infections. Ask your health care provider if a screening pelvic exam is right for you.   The Pap test is the screening test for cervical cancer for women who are considered at risk.  If you had a hysterectomy for a problem that was not cancer or a condition that could lead to cancer, then you no longer need Pap tests.  If you are older than 65 years, and you have had normal Pap tests for the past 10 years, you no longer need to have Pap tests.  If you have had past treatment for cervical cancer or a condition that could lead to cancer, you need Pap tests and screening for cancer for at least 20 years after your treatment.  If you no longer get a Pap test, assess your risk factors if they change (such as having a new sexual partner). This can affect whether you should start being screened again.  Some women have medical problems that increase their chance of getting cervical cancer. If this is the case for you, your health care provider may recommend more frequent screening and Pap tests.  The human papillomavirus (HPV) test is another test that may be used for cervical cancer screening. The HPV test looks for the virus that can cause cell changes in the cervix. The cells collected during the Pap test can be tested for HPV.  The HPV test can be used to screen women 58 years of age and older. Getting tested for HPV can extend the interval between normal Pap tests from three to five years.  An HPV test also should be used to screen women of any age who have unclear Pap test results.  After 77 years of age, women should have HPV testing as often as Pap tests.  Colorectal Cancer  This type of cancer can be detected and often  prevented.  Routine colorectal cancer screening usually begins at 77 years of age and continues through 77 years of age.  Your health care provider may recommend screening at an earlier age if you have risk factors for colon cancer.  Your health care provider may also recommend using home test kits to check for hidden blood in the stool.  A small camera at the end of a tube can be used to examine your colon directly (sigmoidoscopy or colonoscopy). This is done to check for the earliest forms of colorectal cancer.  Routine screening usually begins at age 43.  Direct examination of the colon should be repeated every 5-10 years through 77 years of age. However, you may need to be screened more often if early forms of precancerous polyps or small growths are found. Skin Cancer  Check your skin from head to toe regularly.  Tell  your health care provider about any new moles or changes in moles, especially if there is a change in a mole's shape or color.  Also tell your health care provider if you have a mole that is larger than the size of a pencil eraser.  Always use sunscreen. Apply sunscreen liberally and repeatedly throughout the day.  Protect yourself by wearing long sleeves, pants, a wide-brimmed hat, and sunglasses whenever you are outside. HEART DISEASE, DIABETES, AND HIGH BLOOD PRESSURE   Have your blood pressure checked at least every 1-2 years. High blood pressure causes heart disease and increases the risk of stroke.  If you are between 1 years and 61 years old, ask your health care provider if you should take aspirin to prevent strokes.  Have regular diabetes screenings. This involves taking a blood sample to check your fasting blood sugar level.  If you are at a normal weight and have a low risk for diabetes, have this test once every three years after 77 years of age.  If you are overweight and have a high risk for diabetes, consider being tested at a younger age or more  often. PREVENTING INFECTION  Hepatitis B  If you have a higher risk for hepatitis B, you should be screened for this virus. You are considered at high risk for hepatitis B if:  You were born in a country where hepatitis B is common. Ask your health care provider which countries are considered high risk.  Your parents were born in a high-risk country, and you have not been immunized against hepatitis B (hepatitis B vaccine).  You have HIV or AIDS.  You use needles to inject street drugs.  You live with someone who has hepatitis B.  You have had sex with someone who has hepatitis B.  You get hemodialysis treatment.  You take certain medicines for conditions, including cancer, organ transplantation, and autoimmune conditions. Hepatitis C  Blood testing is recommended for:  Everyone born from [birth year] through 1963.  Anyone with known risk factors for hepatitis C. Sexually transmitted infections (STIs)  You should be screened for sexually transmitted infections (STIs) including gonorrhea and chlamydia if:  You are sexually active and are younger than 77 years of age.  You are older than 77 years of age and your health care provider tells you that you are at risk for this type of infection.  Your sexual activity has changed since you were last screened and you are at an increased risk for chlamydia or gonorrhea. Ask your health care provider if you are at risk.  If you do not have HIV, but are at risk, it may be recommended that you take a prescription medicine daily to prevent HIV infection. This is called pre-exposure prophylaxis (PrEP). You are considered at risk if:  You are sexually active and do not regularly use condoms or know the HIV status of your partner(s).  You take drugs by injection.  You are sexually active with a partner who has HIV. Talk with your health care provider about whether you are at high risk of being infected with HIV. If you choose to begin PrEP, you  should first be tested for HIV. You should then be tested every 3 months for as long as you are taking PrEP.  PREGNANCY   If you are premenopausal and you may become pregnant, ask your health care provider about preconception counseling.  If you may become pregnant, take 400 to 800 micrograms (mcg) of folic acid every  day.  If you want to prevent pregnancy, talk to your health care provider about birth control (contraception). OSTEOPOROSIS AND MENOPAUSE   Osteoporosis is a disease in which the bones lose minerals and strength with aging. This can result in serious bone fractures. Your risk for osteoporosis can be identified using a bone density scan.  If you are 36 years of age or older, or if you are at risk for osteoporosis and fractures, ask your health care provider if you should be screened.  Ask your health care provider whether you should take a calcium or vitamin D supplement to lower your risk for osteoporosis.  Menopause may have certain physical symptoms and risks.  Hormone replacement therapy may reduce some of these symptoms and risks. Talk to your health care provider about whether hormone replacement therapy is right for you.  HOME CARE INSTRUCTIONS   Schedule regular health, dental, and eye exams.  Stay current with your immunizations.   Do not use any tobacco products including cigarettes, chewing tobacco, or electronic cigarettes.  If you are pregnant, do not drink alcohol.  If you are breastfeeding, limit how much and how often you drink alcohol.  Limit alcohol intake to no more than 1 drink per day for nonpregnant women. One drink equals 12 ounces of beer, 5 ounces of wine, or 1 ounces of hard liquor.  Do not use street drugs.  Do not share needles.  Ask your health care provider for help if you need support or information about quitting drugs.  Tell your health care provider if you often feel depressed.  Tell your health care provider if you have ever  been abused or do not feel safe at home. Document Released: 10/13/2010 Document Revised: 08/14/2013 Document Reviewed: 03/01/2013 [facility] Patient Information 2015 [place], [place]. This information is not intended to replace advice given to you by your health care provider. Make sure you discuss any questions you have with your health care provider.

## 2014-07-27 ENCOUNTER — Other Ambulatory Visit: Payer: Self-pay | Admitting: Family

## 2014-07-30 ENCOUNTER — Ambulatory Visit (INDEPENDENT_AMBULATORY_CARE_PROVIDER_SITE_OTHER): Payer: Medicare Other | Admitting: Family

## 2014-07-30 ENCOUNTER — Encounter: Payer: Self-pay | Admitting: Family

## 2014-07-30 VITALS — BP 134/87 | HR 60 | Temp 98.0°F | Ht 62.25 in | Wt 158.6 lb

## 2014-07-30 DIAGNOSIS — M1711 Unilateral primary osteoarthritis, right knee: Secondary | ICD-10-CM | POA: Diagnosis not present

## 2014-07-30 MED ORDER — KETOROLAC TROMETHAMINE 30 MG/ML IJ SOLN
30.0000 mg | Freq: Once | INTRAMUSCULAR | Status: AC
  Administered 2014-07-30: 30 mg via INTRAMUSCULAR

## 2014-07-30 MED ORDER — MELOXICAM 15 MG PO TABS: 15.0000 mg | ORAL_TABLET | Freq: Every day | ORAL | Status: DC

## 2014-07-30 NOTE — Progress Notes (Signed)
   Subjective:    Patient ID: [name], female    DOB: 04/13/1936, 79 y.o.   MRN: [id number]  Knee Pain  The incident occurred more than 1 week ago. There was no injury mechanism. The pain is present in the right knee. The quality of the pain is described as burning, aching and stabbing. The pain is at a severity of 7/10. The pain is moderate. Associated symptoms include numbness. Pertinent negatives include no inability to bear weight, loss of motion or tingling. She has tried ice and NSAIDs for the symptoms. The treatment provided mild relief.   *Pt states this has been going on for over 8 years. Pt states she has been told in the past that she had arthritis.    Review of Systems  Constitutional: Negative.   HENT: Negative.   Eyes: Negative.   Respiratory: Negative.  Negative for shortness of breath.   Cardiovascular: Negative.  Negative for palpitations.  Gastrointestinal: Negative.   Endocrine: Negative.   Genitourinary: Negative.   Musculoskeletal: Negative.   Neurological: Positive for numbness. Negative for tingling and headaches.  Hematological: Negative.   Psychiatric/Behavioral: Negative.   All other systems reviewed and are negative.      Objective:   Physical Exam  Constitutional: She is oriented to person, place, and time. She appears well-developed and well-nourished. No distress.  HENT:  Head: Normocephalic and atraumatic.  Eyes: Pupils are equal, round, and reactive to light.  Neck: Normal range of motion. Neck supple. No thyromegaly present.  Cardiovascular: Normal rate, regular rhythm and intact distal pulses.   Murmur heard. Pulmonary/Chest: Effort normal and breath sounds normal. No respiratory distress. She has no wheezes.  Abdominal: Soft. Bowel sounds are normal. She exhibits no distension. There is no tenderness.  Musculoskeletal: Normal range of motion. She exhibits no edema or tenderness.  Trace amt of swelling in right knee  Neurological: She is  alert and oriented to person, place, and time. She has normal reflexes. No cranial nerve deficit.  Skin: Skin is warm and dry.  Psychiatric: She has a normal mood and affect. Her behavior is normal. Judgment and thought content normal.  Vitals reviewed.   BP 134/87 mmHg  Pulse 60  Temp(Src) 98 F (36.7 C) (Oral)  Ht 5' 2.25" (1.581 m)  Wt 158 lb 9.6 oz (71.94 kg)  BMI 28.78 kg/m2      Assessment & Plan:  1. Primary osteoarthritis of right knee -Rest -No other NSAID's -Take mobic with food - BMP8+EGFR - meloxicam (MOBIC) 15 MG tablet; Take 1 tablet (15 mg total) by mouth daily.  Dispense: 30 tablet; Refill: 0 - ketorolac (TORADOL) 30 MG/ML injection 30 mg; Inject 1 mL (30 mg total) into the muscle once.  Evelina Dun, FNP

## 2014-07-30 NOTE — Addendum Note (Signed)
Addended by: Evelina Dun A on: 07/30/2014 03:51 PM   Modules accepted: Level of Service

## 2014-07-30 NOTE — Patient Instructions (Signed)
Osteoarthritis Osteoarthritis is a disease that causes soreness and inflammation of a joint. It occurs when the cartilage at the affected joint wears down. Cartilage acts as a cushion, covering the ends of bones where they meet to form a joint. Osteoarthritis is the most common form of arthritis. It often occurs in older people. The joints affected most often by this condition include those in the:  Ends of the fingers.  Thumbs.  Neck.  Lower back.  Knees.  Hips. CAUSES  Over time, the cartilage that covers the ends of bones begins to wear away. This causes bone to rub on bone, producing pain and stiffness in the affected joints.  RISK FACTORS Certain factors can increase your chances of having osteoarthritis, including:  Older age.  Excessive body weight.  Overuse of joints.  Previous joint injury. SIGNS AND SYMPTOMS   Pain, swelling, and stiffness in the joint.  Over time, the joint may lose its normal shape.  Small deposits of bone (osteophytes) may grow on the edges of the joint.  Bits of bone or cartilage can break off and float inside the joint space. This may cause more pain and damage. DIAGNOSIS  Your health care provider will do a physical exam and ask about your symptoms. Various tests may be ordered, such as:  X-rays of the affected joint.  An MRI scan.  Blood tests to rule out other types of arthritis.  Joint fluid tests. This involves using a needle to draw fluid from the joint and examining the fluid under a microscope. TREATMENT  Goals of treatment are to control pain and improve joint function. Treatment plans may include:  A prescribed exercise program that allows for rest and joint relief.  A weight control plan.  Pain relief techniques, such as:  Properly applied heat and cold.  Electric pulses delivered to nerve endings under the skin (transcutaneous electrical nerve stimulation [TENS]).  Massage.  Certain nutritional  supplements.  Medicines to control pain, such as:  Acetaminophen.  Nonsteroidal anti-inflammatory drugs (NSAIDs), such as naproxen.  Narcotic or central-acting agents, such as tramadol.  Corticosteroids. These can be given orally or as an injection.  Surgery to reposition the bones and relieve pain (osteotomy) or to remove loose pieces of bone and cartilage. Joint replacement may be needed in advanced states of osteoarthritis. HOME CARE INSTRUCTIONS   Take medicines only as directed by your health care provider.  Maintain a healthy weight. Follow your health care provider's instructions for weight control. This may include dietary instructions.  Exercise as directed. Your health care provider can recommend specific types of exercise. These may include:  Strengthening exercises. These are done to strengthen the muscles that support joints affected by arthritis. They can be performed with weights or with exercise bands to add resistance.  Aerobic activities. These are exercises, such as brisk walking or low-impact aerobics, that get your heart pumping.  Range-of-motion activities. These keep your joints limber.  Balance and agility exercises. These help you maintain daily living skills.  Rest your affected joints as directed by your health care provider.  Keep all follow-up visits as directed by your health care provider. SEEK MEDICAL CARE IF:   Your skin turns red.  You develop a rash in addition to your joint pain.  You have worsening joint pain.  You have a fever along with joint or muscle aches. SEEK IMMEDIATE MEDICAL CARE IF:  You have a significant loss of weight or appetite.  You have night sweats. FOR MORE  INFORMATION  °· National Institute of Arthritis and Musculoskeletal and Skin Diseases: www.niams.nih.gov °· National Institute on Aging: www.nia.nih.gov °· American College of Rheumatology: www.rheumatology.org °Document Released: 03/30/2005 Document Revised:  08/14/2013 Document Reviewed: 12/05/2012 °Exit[employer]® Patient Information ©2015 [employer]. This information is not intended to replace advice given to you by your health care provider. Make sure you discuss any questions you have with your health care provider. ° ° ° °Knee Pain °The knee is the complex joint between your thigh and your lower leg. It is made up of bones, tendons, ligaments, and cartilage. The bones that make up the knee are: °· The femur in the thigh. °· The tibia and fibula in the lower leg. °· The patella or kneecap riding in the groove on the lower femur. °CAUSES  °Knee pain is a common complaint with many causes. A few of these causes are: °· Injury, such as: °¨ A ruptured ligament or tendon injury. °¨ Torn cartilage. °· Medical conditions, such as: °¨ Gout °¨ Arthritis °¨ Infections °· Overuse, over training, or overdoing a physical activity. °Knee pain can be minor or severe. Knee pain can accompany debilitating injury. Minor knee problems often respond well to self-care measures or get well on their own. More serious injuries may need medical intervention or even surgery. °SYMPTOMS °The knee is complex. Symptoms of knee problems can vary widely. Some of the problems are: °· Pain with movement and weight bearing. °· Swelling and tenderness. °· Buckling of the knee. °· Inability to straighten or extend your knee. °· Your knee locks and you cannot straighten it. °· Warmth and redness with pain and fever. °· Deformity or dislocation of the kneecap. °DIAGNOSIS  °Determining what is wrong may be very straight forward such as when there is an injury. It can also be challenging because of the complexity of the knee. Tests to make a diagnosis may include: °· Your caregiver taking a history and doing a physical exam. °· Routine X-rays can be used to rule out other problems. X-rays will not reveal a cartilage tear. Some injuries of the knee can be diagnosed by: °¨ Arthroscopy a surgical technique by  which a small video camera is inserted through tiny incisions on the sides of the knee. This procedure is used to examine and repair internal knee joint problems. Tiny instruments can be used during arthroscopy to repair the torn knee cartilage (meniscus). °¨ Arthrography is a radiology technique. A contrast liquid is directly injected into the knee joint. Internal structures of the knee joint then become visible on X-ray film. °¨ An MRI scan is a non X-ray radiology procedure in which magnetic fields and a computer produce two- or three-dimensional images of the inside of the knee. Cartilage tears are often visible using an MRI scanner. MRI scans have largely replaced arthrography in diagnosing cartilage tears of the knee. °· Blood work. °· Examination of the fluid that helps to lubricate the knee joint (synovial fluid). This is done by taking a sample out using a needle and a syringe. °TREATMENT °The treatment of knee problems depends on the cause. Some of these treatments are: °· Depending on the injury, proper casting, splinting, surgery, or physical therapy care will be needed. °· Give yourself adequate recovery time. Do not overuse your joints. If you begin to get sore during workout routines, back off. Slow down or do fewer repetitions. °· For repetitive activities such as cycling or running, maintain your strength and nutrition. °· Alternate muscle groups. For example,   if you are a weight lifter, work the upper body on one day and the lower body the next. °· Either tight or weak muscles do not give the proper support for your knee. Tight or weak muscles do not absorb the stress placed on the knee joint. Keep the muscles surrounding the knee strong. °· Take care of mechanical problems. °¨ If you have flat feet, orthotics or special shoes may help. See your caregiver if you need help. °¨ Arch supports, sometimes with wedges on the inner or outer aspect of the heel, can help. These can shift pressure away from  the side of the knee most bothered by osteoarthritis. °¨ A brace called an "unloader" brace also may be used to help ease the pressure on the most arthritic side of the knee. °· If your caregiver has prescribed crutches, braces, wraps or ice, use as directed. The acronym for this is PRICE. This means protection, rest, ice, compression, and elevation. °· Nonsteroidal anti-inflammatory drugs (NSAIDs), can help relieve pain. But if taken immediately after an injury, they may actually increase swelling. Take NSAIDs with food in your stomach. Stop them if you develop stomach problems. Do not take these if you have a history of ulcers, stomach pain, or bleeding from the bowel. Do not take without your caregiver's approval if you have problems with fluid retention, heart failure, or kidney problems. °· For ongoing knee problems, physical therapy may be helpful. °· Glucosamine and chondroitin are over-the-counter dietary supplements. Both may help relieve the pain of osteoarthritis in the knee. These medicines are different from the usual anti-inflammatory drugs. Glucosamine may decrease the rate of cartilage destruction. °· Injections of a corticosteroid drug into your knee joint may help reduce the symptoms of an arthritis flare-up. They may provide pain relief that lasts a few months. You may have to wait a few months between injections. The injections do have a small increased risk of infection, water retention, and elevated blood sugar levels. °· Hyaluronic acid injected into damaged joints may ease pain and provide lubrication. These injections may work by reducing inflammation. A series of shots may give relief for as long as 6 months. °· Topical painkillers. Applying certain ointments to your skin may help relieve the pain and stiffness of osteoarthritis. Ask your pharmacist for suggestions. Many over the-counter products are approved for temporary relief of arthritis pain. °· In some countries, doctors often  prescribe topical NSAIDs for relief of chronic conditions such as arthritis and tendinitis. A review of treatment with NSAID creams found that they worked as well as oral medications but without the serious side effects. °PREVENTION °· Maintain a healthy weight. Extra pounds put more strain on your joints. °· Get strong, stay limber. Weak muscles are a common cause of knee injuries. Stretching is important. Include flexibility exercises in your workouts. °· Be smart about exercise. If you have osteoarthritis, chronic knee pain or recurring injuries, you may need to change the way you exercise. This does not mean you have to stop being active. If your knees ache after jogging or playing basketball, consider switching to swimming, water aerobics, or other low-impact activities, at least for a few days a week. Sometimes limiting high-impact activities will provide relief. °· Make sure your shoes fit well. Choose footwear that is right for your sport. °· Protect your knees. Use the proper gear for knee-sensitive activities. Use kneepads when playing volleyball or laying carpet. Buckle your seat belt every time you drive. Most shattered kneecaps occur   in car accidents. °· Rest when you are tired. °SEEK MEDICAL CARE IF:  °You have knee pain that is continual and does not seem to be getting better.  °SEEK IMMEDIATE MEDICAL CARE IF:  °Your knee joint feels hot to the touch and you have a high fever. °MAKE SURE YOU:  °· Understand these instructions. °· Will watch your condition. °· Will get help right away if you are not doing well or get worse. °Document Released: 01/25/2007 Document Revised: 06/22/2011 Document Reviewed: 01/25/2007 °ExitCare® Patient Information ©2015 [employer]. This information is not intended to replace advice given to you by your health care provider. Make sure you discuss any questions you have with your health care provider. ° °

## 2014-08-02 DIAGNOSIS — Z961 Presence of intraocular lens: Secondary | ICD-10-CM | POA: Diagnosis not present

## 2014-08-02 DIAGNOSIS — H04123 Dry eye syndrome of bilateral lacrimal glands: Secondary | ICD-10-CM | POA: Diagnosis not present

## 2014-08-06 DIAGNOSIS — Z1231 Encounter for screening mammogram for malignant neoplasm of breast: Secondary | ICD-10-CM | POA: Diagnosis not present

## 2014-08-28 ENCOUNTER — Other Ambulatory Visit: Payer: Self-pay | Admitting: Family

## 2014-08-31 ENCOUNTER — Encounter: Payer: Self-pay | Admitting: Nurse Practitioner

## 2014-10-23 ENCOUNTER — Ambulatory Visit (INDEPENDENT_AMBULATORY_CARE_PROVIDER_SITE_OTHER): Payer: Medicare Other | Admitting: Family

## 2014-10-23 ENCOUNTER — Encounter: Payer: Self-pay | Admitting: Family

## 2014-10-23 VITALS — BP 153/65 | HR 57 | Temp 98.2°F | Ht 62.5 in | Wt 153.2 lb

## 2014-10-23 DIAGNOSIS — R1011 Right upper quadrant pain: Secondary | ICD-10-CM

## 2014-10-23 DIAGNOSIS — M546 Pain in thoracic spine: Secondary | ICD-10-CM | POA: Diagnosis not present

## 2014-10-23 LAB — POCT URINALYSIS DIPSTICK
Bilirubin, UA: NEGATIVE
Blood, UA: NEGATIVE
Glucose, UA: NEGATIVE
Ketones, UA: NEGATIVE
Leukocytes, UA: NEGATIVE
NITRITE UA: NEGATIVE
PH UA: 8
Protein, UA: NEGATIVE
Spec Grav, UA: 1.01
UROBILINOGEN UA: NEGATIVE

## 2014-10-23 LAB — POCT UA - MICROSCOPIC ONLY
Bacteria, U Microscopic: NEGATIVE
CRYSTALS, UR, HPF, POC: NEGATIVE
Casts, Ur, LPF, POC: NEGATIVE
MUCUS UA: NEGATIVE
RBC, urine, microscopic: NEGATIVE
Yeast, UA: NEGATIVE

## 2014-10-23 LAB — POCT CBC
Granulocyte percent: 57.2 %G (ref 37–80)
HEMATOCRIT: 40.2 % (ref 37.7–47.9)
HEMOGLOBIN: 12.7 g/dL (ref 12.2–16.2)
LYMPH, POC: 2.5 (ref 0.6–3.4)
MCH, POC: 28.3 pg (ref 27–31.2)
MCHC: 31.6 g/dL — AB (ref 31.8–35.4)
MCV: 89.6 fL (ref 80–97)
MPV: 8.8 fL (ref 0–99.8)
PLATELET COUNT, POC: 220 10*3/uL (ref 142–424)
POC Granulocyte: 4.1 (ref 2–6.9)
POC LYMPH PERCENT: 35.3 %L (ref 10–50)
RBC: 4.48 M/uL (ref 4.04–5.48)
RDW, POC: 13.7 %
WBC: 7.1 10*3/uL (ref 4.6–10.2)

## 2014-10-23 NOTE — Progress Notes (Signed)
Subjective:    Patient ID: [name], female    DOB: 01-31-1936, 79 y.o.   MRN: [id number]  Back Pain This is a recurrent problem. The current episode started 1 to 4 weeks ago. The problem occurs constantly. The problem has been waxing and waning since onset. The pain is present in the lumbar spine. The pain is at a severity of 5/10. The pain is moderate. Associated symptoms include abdominal pain. Pertinent negatives include no bladder incontinence, bowel incontinence, dysuria, headaches or numbness. She has tried NSAIDs for the symptoms. The treatment provided mild relief.  Abdominal Pain This is a recurrent problem. The current episode started 1 to 4 weeks ago. The onset quality is sudden. The problem occurs intermittently. The problem has been waxing and waning. The pain is located in the RUQ. The pain is at a severity of 5/10. The pain is moderate. The abdominal pain radiates to the back. Associated symptoms include nausea. Pertinent negatives include no diarrhea, dysuria, frequency, headaches or vomiting. The pain is aggravated by certain positions. Relieved by: laying down. The treatment provided no relief.   *Pt does not have gallbladder, but states she is having RUQ pain that moves to the center of her stomach.    Review of Systems  Constitutional: Negative.   HENT: Negative.   Eyes: Negative.   Respiratory: Negative.  Negative for shortness of breath.   Cardiovascular: Negative.  Negative for palpitations.  Gastrointestinal: Positive for nausea and abdominal pain. Negative for vomiting, diarrhea and bowel incontinence.  Endocrine: Negative.   Genitourinary: Negative.  Negative for bladder incontinence, dysuria and frequency.  Musculoskeletal: Positive for back pain.  Neurological: Negative.  Negative for numbness and headaches.  Hematological: Negative.   Psychiatric/Behavioral: Negative.   All other systems reviewed and are negative.      Objective:   Physical Exam    Constitutional: She is oriented to person, place, and time. She appears well-developed and well-nourished. No distress.  HENT:  Head: Normocephalic and atraumatic.  Right Ear: External ear normal.  Left Ear: External ear normal.  Nose: Nose normal.  Mouth/Throat: Oropharynx is clear and moist.  Eyes: Pupils are equal, round, and reactive to light.  Neck: Normal range of motion. Neck supple. No thyromegaly present.  Cardiovascular: Normal rate, regular rhythm, normal heart sounds and intact distal pulses.   No murmur heard. Pulmonary/Chest: Effort normal and breath sounds normal. No respiratory distress. She has no wheezes.  Abdominal: Soft. Bowel sounds are normal. She exhibits no distension. There is tenderness (mild generalized tenderness).  Musculoskeletal: Normal range of motion. She exhibits no edema or tenderness.  Neurological: She is alert and oriented to person, place, and time. She has normal reflexes. No cranial nerve deficit.  Skin: Skin is warm and dry.  Psychiatric: She has a normal mood and affect. Her behavior is normal. Judgment and thought content normal.  Vitals reviewed.  Results for orders placed or performed in visit on 10/23/14  POCT urinalysis dipstick  Result Value Ref Range   Color, UA straw    Clarity, UA clear    Glucose, UA neg    Bilirubin, UA neg    Ketones, UA neg    Spec Grav, UA 1.010    Blood, UA neg    pH, UA 8.0    Protein, UA neg    Urobilinogen, UA negative    Nitrite, UA neg    Leukocytes, UA Negative Negative  POCT UA - Microscopic Only  Result Value Ref  Range   WBC, Ur, HPF, POC occ    RBC, urine, microscopic neg    Bacteria, U Microscopic neg    Mucus, UA neg    Epithelial cells, urine per micros few    Crystals, Ur, HPF, POC neg    Casts, Ur, LPF, POC neg    Yeast, UA neg       BP 153/65 mmHg  Pulse 57  Temp(Src) 98.2 F (36.8 C) (Oral)  Ht 5' 2.5" (1.588 m)  Wt 153 lb 3.2 oz (69.491 kg)  BMI 27.56 kg/m2      Assessment & Plan:  1. Bilateral thoracic back pain - POCT urinalysis dipstick - POCT UA - Microscopic Only  2. Right upper quadrant pain - CT Abdomen Pelvis W Contrast; Future  Pt refuses all pain medication at this and does not want a muscle relaxer. Pt does not want to restart PPI Pt states she wants to "find out" what is causing these "pains" Urine negative for kidney stone Avoid spicy food & NSAID's Labs pending- CMP & CBC  Evelina Dun, FNP

## 2014-10-23 NOTE — Patient Instructions (Signed)
Back Pain, Adult Low back pain is very common. About 1 in 5 people have back pain.The cause of low back pain is rarely dangerous. The pain often gets better over time.About half of people with a sudden onset of back pain feel better in just 2 weeks. About 8 in 10 people feel better by 6 weeks.  CAUSES Some common causes of back pain include:  Strain of the muscles or ligaments supporting the spine.  Wear and tear (degeneration) of the spinal discs.  Arthritis.  Direct injury to the back. DIAGNOSIS Most of the time, the direct cause of low back pain is not known.However, back pain can be treated effectively even when the exact cause of the pain is unknown.Answering your caregiver's questions about your overall health and symptoms is one of the most accurate ways to make sure the cause of your pain is not dangerous. If your caregiver needs more information, he or she may order lab work or imaging tests (X-rays or MRIs).However, even if imaging tests show changes in your back, this usually does not require surgery. HOME CARE INSTRUCTIONS For many people, back pain returns.Since low back pain is rarely dangerous, it is often a condition that people can learn to manageon their own.   Remain active. It is stressful on the back to sit or stand in one place. Do not sit, drive, or stand in one place for more than 30 minutes at a time. Take short walks on level surfaces as soon as pain allows.Try to increase the length of time you walk each day.  Do not stay in bed.Resting more than 1 or 2 days can delay your recovery.  Do not avoid exercise or work.Your body is made to move.It is not dangerous to be active, even though your back may hurt.Your back will likely heal faster if you return to being active before your pain is gone.  Pay attention to your body when you bend and lift. Many people have less discomfortwhen lifting if they bend their knees, keep the load close to their bodies,and  avoid twisting. Often, the most comfortable positions are those that put less stress on your recovering back.  Find a comfortable position to sleep. Use a firm mattress and lie on your side with your knees slightly bent. If you lie on your back, put a pillow under your knees.  Only take over-the-counter or prescription medicines as directed by your caregiver. Over-the-counter medicines to reduce pain and inflammation are often the most helpful.Your caregiver may prescribe muscle relaxant drugs.These medicines help dull your pain so you can more quickly return to your normal activities and healthy exercise.  Put ice on the injured area.  Put ice in a plastic bag.  Place a towel between your skin and the bag.  Leave the ice on for 15-20 minutes, 03-04 times a day for the first 2 to 3 days. After that, ice and heat may be alternated to reduce pain and spasms.  Ask your caregiver about trying back exercises and gentle massage. This may be of some benefit.  Avoid feeling anxious or stressed.Stress increases muscle tension and can worsen back pain.It is important to recognize when you are anxious or stressed and learn ways to manage it.Exercise is a great option. SEEK MEDICAL CARE IF:  You have pain that is not relieved with rest or medicine.  You have pain that does not improve in 1 week.  You have new symptoms.  You are generally not feeling well. SEEK   IMMEDIATE MEDICAL CARE IF:   You have pain that radiates from your back into your legs.  You develop new bowel or bladder control problems.  You have unusual weakness or numbness in your arms or legs.  You develop nausea or vomiting.  You develop abdominal pain.  You feel faint. Document Released: 03/30/2005 Document Revised: 09/29/2011 Document Reviewed: 08/01/2013 [employer] Patient Information 2015 [employer]. This information is not intended to replace advice given to you by your health care provider. Make sure you  discuss any questions you have with your health care provider.  

## 2014-10-24 ENCOUNTER — Other Ambulatory Visit: Payer: Self-pay | Admitting: Family

## 2014-10-24 LAB — CMP14+EGFR
ALBUMIN: 4 g/dL (ref 3.5–4.8)
ALT: 16 IU/L (ref 0–32)
AST: 15 IU/L (ref 0–40)
Albumin/Globulin Ratio: 1.8 (ref 1.1–2.5)
Alkaline Phosphatase: 64 IU/L (ref 39–117)
BUN/Creatinine Ratio: 13 (ref 11–26)
BUN: 9 mg/dL (ref 8–27)
Bilirubin Total: 0.3 mg/dL (ref 0.0–1.2)
CALCIUM: 10 mg/dL (ref 8.7–10.3)
CO2: 24 mmol/L (ref 18–29)
Chloride: 98 mmol/L (ref 97–108)
Creatinine, Ser: 0.71 mg/dL (ref 0.57–1.00)
GFR calc Af Amer: 95 mL/min/{1.73_m2} (ref >59)
GFR calc non Af Amer: 82 mL/min/{1.73_m2} (ref >59)
GLOBULIN, TOTAL: 2.2 g/dL (ref 1.5–4.5)
Glucose: 93 mg/dL (ref 65–99)
Potassium: 4.7 mmol/L (ref 3.5–5.2)
Sodium: 140 mmol/L (ref 134–144)
Total Protein: 6.2 g/dL (ref 6.0–8.5)

## 2014-10-24 NOTE — Telephone Encounter (Signed)
Last seen 10/23/14  Last lipid 10/22/13

## 2014-10-31 ENCOUNTER — Ambulatory Visit (HOSPITAL_COMMUNITY)
Admission: RE | Admit: 2014-10-31 | Discharge: 2014-10-31 | Disposition: A | Discharge status: Home / Self Care | Payer: Medicare Other | Source: Ambulatory Visit | Attending: Family | Admitting: Family

## 2014-10-31 ENCOUNTER — Encounter (HOSPITAL_COMMUNITY): Payer: Self-pay

## 2014-10-31 DIAGNOSIS — M47896 Other spondylosis, lumbar region: Secondary | ICD-10-CM | POA: Diagnosis not present

## 2014-10-31 DIAGNOSIS — Z9049 Acquired absence of other specified parts of digestive tract: Secondary | ICD-10-CM | POA: Diagnosis not present

## 2014-10-31 DIAGNOSIS — Z90710 Acquired absence of both cervix and uterus: Secondary | ICD-10-CM | POA: Diagnosis not present

## 2014-10-31 DIAGNOSIS — K5730 Diverticulosis of large intestine without perforation or abscess without bleeding: Secondary | ICD-10-CM | POA: Diagnosis not present

## 2014-10-31 DIAGNOSIS — R1011 Right upper quadrant pain: Secondary | ICD-10-CM | POA: Diagnosis present

## 2014-10-31 MED ORDER — IOHEXOL 300 MG/ML  SOLN
100.0000 mL | Freq: Once | INTRAMUSCULAR | Status: AC | PRN
  Administered 2014-10-31: 100 mL via INTRAVENOUS

## 2014-11-08 ENCOUNTER — Encounter: Payer: Self-pay | Admitting: *Deleted

## 2014-11-08 ENCOUNTER — Ambulatory Visit (INDEPENDENT_AMBULATORY_CARE_PROVIDER_SITE_OTHER): Payer: Medicare Other | Admitting: *Deleted

## 2014-11-08 VITALS — BP 130/65 | HR 56 | Ht 62.0 in | Wt 151.0 lb

## 2014-11-08 DIAGNOSIS — Z0000 Encounter for general adult medical examination without abnormal findings: Secondary | ICD-10-CM

## 2014-11-08 NOTE — Patient Instructions (Addendum)
  [name] , Thank you for taking time to come for your Medicare Wellness Visit. I appreciate your ongoing commitment to your health goals. Please review the following plan we discussed and let me know if I can assist you in the future.   These are the goals we discussed: Goals    . Exercise 3x per week (30 min per time)       This is a list of the screening recommended for you and due dates:  Health Maintenance  Topic Date Due  . Tetanus Vaccine  01/25/2015*  . Pneumonia vaccines (1 of 2 - PCV13) 01/25/2015*  . Shingles Vaccine  11/25/2015*  . Flu Shot  11/12/2014  . Pap Smear  02/18/2015  . Mammogram  08/06/2015  . DEXA scan (bone density measurement)  07/25/2016  . Colon Cancer Screening  01/16/2020  *Topic was postponed. The date shown is not the original due date.   Continue current medications. Keep all follow up appointments.  Try to walk for 30 minutes 3 times per week but avoid walking when it is hot.  Return FOBT (stool specimen card)   Fall Prevention and Home Safety Falls cause injuries and can affect all age groups. It is possible to prevent falls.  HOW TO PREVENT FALLS  Wear shoes with rubber soles that do not have an opening for your toes.  Keep the inside and outside of your house well lit.  Use night lights throughout your home.  Remove clutter from floors.  Clean up floor spills.  Remove throw rugs or fasten them to the floor with carpet tape.  Do not place electrical cords across pathways.  Put grab bars by your tub, shower, and toilet. Do not use towel bars as grab bars.  Put handrails on both sides of the stairway. Fix loose handrails.  Do not climb on stools or stepladders, if possible.  Do not wax your floors.  Repair uneven or unsafe sidewalks, walkways, or stairs.  Keep items you use a lot within reach.  Be aware of pets.  Keep emergency numbers next to the telephone.  Put smoke detectors in your home and near bedrooms. Ask your  doctor what other things you can do to prevent falls. Document Released: 01/24/2009 Document Revised: 09/29/2011 Document Reviewed: 06/30/2011 [facility] Patient Information 2015 [place], [place]. This information is not intended to replace advice given to you by your health care provider. Make sure you discuss any questions you have with your health care provider.

## 2014-11-08 NOTE — Progress Notes (Addendum)
Patient ID: [name], female   DOB: [birth year], 79 y.o.   MRN: [id number]   Subjective:   [name] is a 79 y.o. female who presents for an Initial Medicare Annual Wellness Visit. Patient lives at home with her husband.   Review of Systems     Cardiac Risk Factors include: advanced age (>8men, >101 women);hypertension;smoking/ tobacco exposure     Objective:    Today's Vitals   11/08/14 0909  BP: 130/65  Pulse: 56  Height: 5\' 2"  (1.575 m)  Weight: 151 lb (68.493 kg)    Current Medications (verified) Outpatient Encounter Prescriptions as of 11/08/2014  Medication Sig  . Aloe Vera 25 MG CAPS Take 1 capsule by mouth daily.  Marland Kitchen ascorbic acid (VITAMIN C) 1000 MG tablet Take 1,000 mg by mouth daily.  Marland Kitchen aspirin 81 MG tablet Take 81 mg by mouth daily.   Marland Kitchen BEE POLLEN PO Take 400 mg by mouth daily.  . Cholecalciferol (VITAMIN D3 SUPER STRENGTH) 2000 UNITS TABS Take 2,000 Units by mouth daily.  . Coenzyme Q10 (CO Q 10) 100 MG CAPS Take 1 capsule by mouth daily.  . cycloSPORINE (RESTASIS) 0.05 % ophthalmic emulsion Place 1 drop into both eyes 2 (two) times daily as needed.   . Digestive Enzymes (PAPAYA ENZYME PO) Take by mouth.  . metoprolol (LOPRESSOR) 50 MG tablet TAKE ONE TABLET BY MOUTH TWICE DAILY  . omeprazole (PRILOSEC) 20 MG capsule TAKE 1 CAPSULE (20 MG TOTAL)   BY MOUTH EVERY EVENING.  . Vitamin D, Ergocalciferol, (DRISDOL) 50000 UNITS CAPS capsule Take 50,000 Units by mouth every 7 (seven) days. SUNDAYS  . [DISCONTINUED] Calcium-Vitamin D 600-125 MG-UNIT TABS Take 1 tablet by mouth at bedtime.   . [DISCONTINUED] atorvastatin (LIPITOR) 80 MG tablet TAKE 1 TABLET (80 MG TOTAL) BY MOUTH DAILY.  . [DISCONTINUED] gabapentin (NEURONTIN) 600 MG tablet TAKE ONE TABLET BY MOUTH THREE TIMES DAILY (Patient not taking: Reported on 10/23/2014)   No facility-administered encounter medications on file as of 11/08/2014.    Allergies (verified) Tape   History: Past Medical History    Diagnosis Date  . Hypertension   . GERD (gastroesophageal reflux disease)   . Hyperlipidemia   . Chronic headaches   . Bruises easily   . History of kidney stones   . CAD (coronary artery disease) CARDIOLOGIST- DR Jenkins Rouge-- LAST VISIT IN [facility]  . Heart murmur   . Spinal stenosis, multilevel   . Spondylosis, cervical   . Fibromyalgia   . OSA (obstructive sleep apnea) CPAP INTOLERANT   . H/O hypoglycemia   . Eczema   . Complication of anesthesia     bad reaction to ether  . Hemorrhoids     INTERNAL--  POST BANDING 02/ 2015  . Arthritis   . History of colon polyps   . Diverticulosis of colon   . Hypothyroidism, postsurgical   . History of lower GI bleeding     SECONDARY TO INTERNAL HEMORRHOIDS 02/  2015--  RESOLVED  . History of adenomatous polyp of colon   . Moderate aortic stenosis   . Tenosynovitis of wrist     LEFT  . Cataracts, bilateral    Past Surgical History  Procedure Laterality Date  . Umbilical hernia repair  JUNE 2006  . Vaginal hysterectomy  01-31-2001    ANTERIOR & POSTERIOR REPAIR/ TRANSVAGINAL BLADDER SLING  . Anterior cervical decomp/discectomy fusion  11-11-1999    C5 - C6  . Thyroidectomy  AGE 71  GOITER  . Hemorrhoidectomy with hemorrhoid banding  08-07-2010  . Laparoscopic cholecystectomy  1990  . Appendectomy  AGE 74  . Tonsillectomy and adenoidectomy  AGE 52  . Cataract extraction w/ intraocular lens  implant, bilateral  2012  . Transthoracic echocardiogram  last one 04-20-2013  DR [facility]     NORMAL LVSF/ EF 60-65%/ MODERATE  AV  STENOSIS WITH NO AR /  MILD LAE  . Cardiac catheterization  2008  DR [facility]    ESSENTIALLY NORMAL  . Shoulder arthroscopy with open rotator cuff repair and distal clavicle acrominectomy Right 05/25/2012    Procedure: SHOULDER ARTHROSCOPY WITH OPEN ROTATOR CUFF REPAIR AND DISTAL CLAVICLE ACROMINECTOMY;  Surgeon: Magnus Sinning, MD;  Location: [place];  Service: Orthopedics;  Laterality: Right;   RIGHT SHOULDER ARTHROSCOPY WITH DERBRIDEMENTOF LABRAL/BICEP TENDON, OPEN DISTAL CLAVICLE RESECTION, ANTERIOR ACROMINECTOMY ROTATOR CUFF REPAIR ANESTHESIA: GENERAL/SCALENE NERVE BLOCK  . Flexible sigmoidoscopy N/A 06/01/2013    Procedure: FLEXIBLE SIGMOIDOSCOPY;  Surgeon: Inda Castle, MD;  Location: [facility];  Service: Endoscopy;  Laterality: N/A;  may need hemorrhoidal banding  . Tenosynovectomy Left 01/04/2014    Procedure: LEFT WRIST EXTENSOR TENOSYNOVECTOMY;  Surgeon: Linna Hoff, MD;  Location: [facility];  Service: Orthopedics;  Laterality: Left;   Family History  Problem Relation Age of Onset  . Cirrhosis Mother     drinking  . Gallbladder disease Maternal Grandmother   . Other Brother     duodenal ulcer  . Stomach cancer Paternal Grandfather   . Cancer Son 17    colon   Social History   Occupational History  . Not on file.   Social History Main Topics  . Smoking status: Former Smoker -- 0.25 packs/day    Types: Cigarettes    Quit date: 04/14/2003  . Smokeless tobacco: Never Used  . Alcohol Use: Yes     Comment: once in a while-social  . Drug Use: No  . Sexual Activity: Not Currently    Tobacco Counseling Counseling given: Not Answered   Activities of Daily Living In your present state of health, do you have any difficulty performing the following activities: 11/08/2014 11/08/2014  Hearing? - N  Vision? - N  Difficulty concentrating or making decisions? - N  Walking or climbing stairs? - Y  Dressing or bathing? - N  Doing errands, shopping? - N  Conservation officer, nature and eating ? N N  Using the Toilet? N N  In the past six months, have you accidently leaked urine? Y -  Do you have problems with loss of bowel control? N -  Managing your Medications? N -  Managing your Finances? N -  Housekeeping or managing your Housekeeping? N -    Immunizations and Health Maintenance Immunization History  Administered Date(s) Administered  .  Influenza,inj,Quad PF,36+ Mos 02/01/2013   There are no preventive care reminders to display for this patient.  Patient Care Team: Sharion Balloon, FNP as PCP - General (Nurse Practitioner) Chipper Herb, MD as Attending Physician (Family Medicine) [name], MD as Consulting Physician (Cardiology) Inda Castle, MD as Consulting Physician (Gastroenterology) Magnus Sinning, MD as Consulting Physician (Orthopedic Surgery) Clearnce Sorrel, MD as Referring Physician (Neurology) Rutherford Guys, MD as Consulting Physician (Ophthalmology)  Indicate any recent Medical Services you may have received from other than [facility] providers in the past year (date may be approximate).     Assessment:   This is a routine wellness examination  for [place].   Hearing/Vision screen No exam data present  Dietary issues and exercise activities discussed: Current Exercise Habits:: The patient does not participate in regular exercise at present (Patient is very active in her home with housework and cooking.)  Goals    . Exercise 3x per week (30 min per time)      Depression Screen PHQ 2/9 Scores 11/08/2014 10/23/2014 07/30/2014 07/26/2014 02/17/2013 09/07/2012  PHQ - 2 Score 0 0 0 0 0 0    Fall Risk Fall Risk  11/08/2014 11/08/2014 10/23/2014 07/26/2014 02/17/2013  Falls in the past year? No No No No No  Risk for fall due to : History of fall(s) - - History of fall(s) -  Risk for fall due to (comments): - - - fell a few years ago and broke her right ankle and right rotator cuff -    Cognitive Function: MMSE - Mini Mental State Exam 11/08/2014 07/26/2014  Orientation to time 5 5  Orientation to Place 5 5  Registration 3 3  Attention/ Calculation 5 5  Recall 3 3  Language- name 2 objects 2 2  Language- repeat 1 1  Language- follow 3 step command 3 3  Language- read & follow direction 1 1  Write a sentence 1 1  Copy design 1 1  Total score 30 30    Screening Tests Health Maintenance  Topic Date Due    . TETANUS/TDAP  01/25/2015 (Originally 09/03/1954)  . PNA vac Low Risk Adult (1 of 2 - PCV13) 01/25/2015 (Originally 09/04/2002)  . ZOSTAVAX  11/25/2015 (Originally 09/03/1997)  . INFLUENZA VACCINE  11/12/2014  . PAP SMEAR  02/18/2015  . MAMMOGRAM  08/06/2015  . DEXA SCAN  07/25/2016  . COLONOSCOPY  01/16/2020      Plan:    During the course of the visit, Kristien was educated and counseled about the following appropriate screening and preventive services:   Vaccines to include Pneumoccal, Influenza, Hepatitis B, Td, Zostavax, HCV  Electrocardiogram  Cardiovascular disease screening  Colorectal cancer screening  Bone density screening  Diabetes screening  Glaucoma screening  Mammography/PAP  Nutrition counseling  Smoking cessation counseling  Patient Instructions (the written plan) were given to the patient.    Ilean China, RN   11/08/2014      I have reviewed and agree with the above AWV documentation.  [name], M.D.

## 2014-11-09 ENCOUNTER — Other Ambulatory Visit: Payer: Self-pay | Admitting: Family

## 2014-11-15 NOTE — Progress Notes (Addendum)
Patient had an annual wellness visit within the past year.  I have reviewed and agree with the above  [name], M.D.

## 2014-11-16 ENCOUNTER — Other Ambulatory Visit: Payer: Medicare Other

## 2014-11-16 DIAGNOSIS — Z1212 Encounter for screening for malignant neoplasm of rectum: Secondary | ICD-10-CM | POA: Diagnosis not present

## 2014-11-16 NOTE — Progress Notes (Signed)
Lab only 

## 2014-11-18 LAB — FECAL OCCULT BLOOD, IMMUNOCHEMICAL: Fecal Occult Bld: NEGATIVE

## 2014-12-20 ENCOUNTER — Encounter: Payer: Self-pay | Admitting: Family

## 2014-12-20 ENCOUNTER — Ambulatory Visit (INDEPENDENT_AMBULATORY_CARE_PROVIDER_SITE_OTHER): Payer: Medicare Other | Admitting: Family

## 2014-12-20 ENCOUNTER — Ambulatory Visit (INDEPENDENT_AMBULATORY_CARE_PROVIDER_SITE_OTHER): Payer: Medicare Other

## 2014-12-20 VITALS — BP 131/67 | HR 53 | Temp 97.9°F | Ht 62.0 in | Wt 149.8 lb

## 2014-12-20 DIAGNOSIS — R0789 Other chest pain: Secondary | ICD-10-CM | POA: Diagnosis not present

## 2014-12-20 DIAGNOSIS — M542 Cervicalgia: Secondary | ICD-10-CM | POA: Diagnosis not present

## 2014-12-20 DIAGNOSIS — G8929 Other chronic pain: Secondary | ICD-10-CM

## 2014-12-20 DIAGNOSIS — R1011 Right upper quadrant pain: Secondary | ICD-10-CM

## 2014-12-20 MED ORDER — METHYLPREDNISOLONE ACETATE 80 MG/ML IJ SUSP
80.0000 mg | Freq: Once | INTRAMUSCULAR | Status: AC
  Administered 2014-12-20: 80 mg via INTRAMUSCULAR

## 2014-12-20 MED ORDER — KETOROLAC TROMETHAMINE 30 MG/ML IJ SOLN
30.0000 mg | Freq: Once | INTRAMUSCULAR | Status: AC
  Administered 2014-12-20: 30 mg via INTRAMUSCULAR

## 2014-12-20 MED ORDER — TRAMADOL HCL 50 MG PO TABS: 50.0000 mg | ORAL_TABLET | Freq: Three times a day (TID) | ORAL | Status: DC | PRN

## 2014-12-20 NOTE — Patient Instructions (Signed)

## 2014-12-20 NOTE — Progress Notes (Signed)
Subjective:    Patient ID: [name], female    DOB: April 24, 1935, 79 y.o.   MRN: [id number]  Pt presents to the office today for intermittent burning chest pain that started yesterday. Pt states she has chronic neck pain and similar radiating pain to her shoulder and arm in the past. PT states this pain is similar but seems to be worse. Pt states she does not believe this is related to GERD or GAD. She states the pain is more in her "bones". PT states the most pain is in her left collarbone and left shoulder.   Chest Pain  This is a new problem. The current episode started yesterday. The onset quality is gradual. The problem occurs intermittently. The problem has been waxing and waning. The pain is present in the lateral region. The pain is at a severity of 6/10. The pain is mild. The quality of the pain is described as burning and tightness. The pain radiates to the left neck and left shoulder. Associated symptoms include abdominal pain and nausea. Pertinent negatives include no back pain, cough, dizziness, exertional chest pressure, headaches, leg pain, lower extremity edema, malaise/fatigue, near-syncope, palpitations, shortness of breath, vomiting or weakness. She has tried rest (cold packs) for the symptoms. The treatment provided mild relief. Risk factors include being elderly and post-menopausal.      Review of Systems  Constitutional: Negative.  Negative for malaise/fatigue.  HENT: Negative.   Eyes: Negative.   Respiratory: Negative.  Negative for cough and shortness of breath.   Cardiovascular: Positive for chest pain. Negative for palpitations and near-syncope.  Gastrointestinal: Positive for nausea and abdominal pain. Negative for vomiting.  Endocrine: Negative.   Genitourinary: Negative.   Musculoskeletal: Negative.  Negative for back pain.  Neurological: Negative.  Negative for dizziness, weakness and headaches.  Hematological: Negative.   Psychiatric/Behavioral: Negative.     All other systems reviewed and are negative.      Objective:   Physical Exam  Constitutional: She is oriented to person, place, and time. She appears well-developed and well-nourished. No distress.  HENT:  Head: Normocephalic and atraumatic.  Right Ear: External ear normal.  Left Ear: External ear normal.  Nose: Nose normal.  Mouth/Throat: Oropharynx is clear and moist.  Eyes: Pupils are equal, round, and reactive to light.  Neck: Normal range of motion. Neck supple. No thyromegaly present.  Cardiovascular: Normal rate, regular rhythm and intact distal pulses.   Murmur heard. Pulmonary/Chest: Effort normal and breath sounds normal. No respiratory distress. She has no wheezes.  Abdominal: Soft. Bowel sounds are normal. She exhibits no distension. There is tenderness (mild pain in LUQ and LLQ).  Musculoskeletal: Normal range of motion. She exhibits no edema or tenderness.  Neurological: She is alert and oriented to person, place, and time. She has normal reflexes. No cranial nerve deficit.  Skin: Skin is warm and dry.  Psychiatric: She has a normal mood and affect. Her behavior is normal. Judgment and thought content normal.  Vitals reviewed.   Ekg. Sinus rhythm   BP 131/67 mmHg  Pulse 53  Temp(Src) 97.9 F (36.6 C) (Oral)  Ht 5' 2"  (1.575 m)  Wt 149 lb 12.8 oz (67.949 kg)  BMI 27.39 kg/m2       Assessment & Plan:  1. Other chest pain - EKG 12-Lead - DG Chest 2 View; Future - CMP14+EGFR - Troponin I - Troponin T - methylPREDNISolone acetate (DEPO-MEDROL) injection 80 mg; Inject 1 mL (80 mg total) into the muscle  once. - ketorolac (TORADOL) 30 MG/ML injection 30 mg; Inject 1 mL (30 mg total) into the muscle once.  2. Chronic neck pain - CMP14+EGFR - methylPREDNISolone acetate (DEPO-MEDROL) injection 80 mg; Inject 1 mL (80 mg total) into the muscle once. - ketorolac (TORADOL) 30 MG/ML injection 30 mg; Inject 1 mL (30 mg total) into the muscle once. - traMADol  (ULTRAM) 50 MG tablet; Take 1 tablet (50 mg total) by mouth every 8 (eight) hours as needed.  Dispense: 45 tablet; Refill: 0 - Ambulatory referral to Orthopedic Surgery  3. RUQ abdominal pain -No NSAID"S! - CMP14+EGFR - H Pylori, IGM, IGG, IGA AB - methylPREDNISolone acetate (DEPO-MEDROL) injection 80 mg; Inject 1 mL (80 mg total) into the muscle once. - ketorolac (TORADOL) 30 MG/ML injection 30 mg; Inject 1 mL (30 mg total) into the muscle once.  Labs pending Pt to follow up with ortho for neck pain No NSAID's RTO in 2 weeks If chest pain becomes worse or pt develops SOB go to ED  Evelina Dun, FNP

## 2015-01-03 ENCOUNTER — Other Ambulatory Visit: Payer: Medicare Other

## 2015-01-03 ENCOUNTER — Other Ambulatory Visit: Payer: Self-pay | Admitting: Orthopedic Surgery

## 2015-01-03 ENCOUNTER — Ambulatory Visit (INDEPENDENT_AMBULATORY_CARE_PROVIDER_SITE_OTHER): Payer: Medicare Other

## 2015-01-03 DIAGNOSIS — R52 Pain, unspecified: Secondary | ICD-10-CM

## 2015-01-03 DIAGNOSIS — M542 Cervicalgia: Secondary | ICD-10-CM

## 2015-01-03 DIAGNOSIS — M4722 Other spondylosis with radiculopathy, cervical region: Secondary | ICD-10-CM | POA: Diagnosis not present

## 2015-01-03 DIAGNOSIS — Z4789 Encounter for other orthopedic aftercare: Secondary | ICD-10-CM | POA: Diagnosis not present

## 2015-03-11 ENCOUNTER — Encounter: Payer: Self-pay | Admitting: Internal Medicine

## 2015-04-23 ENCOUNTER — Other Ambulatory Visit: Payer: Self-pay | Admitting: Family

## 2015-05-22 NOTE — Progress Notes (Signed)
Patient ID: [name], female   DOB: Sep 07, 1935, 80 y.o.   MRN: [id number] Kirrily is seen today in followup for chest pain aortic stenosis and hypercholesterolemia. His been doing well. She has arthritis and fibromyalgia.She had an essentially normal heart cath in 2008 and a normal Myoview 2009. Her EKGs have been normal. Don't think her current chest pains or angina. She has moderate  aortic stenosis.  Her dentition is in good shape. He is not any palpitations PND orthopnea his been no syncope lower extremity edema or fever of unknown origin.  Echo 05/24/14    Moderate AS   . Study Conclusions  - Left ventricle: The cavity size was normal. Systolic function was vigorous. The estimated ejection fraction was in the range of 65% to 70%. Wall motion was normal; there were no regional wall motion abnormalities. Doppler parameters are consistent with abnormal left ventricular relaxation (grade 1 diastolic dysfunction). There was no evidence of elevated ventricular filling pressure by Doppler parameters. - Aortic valve: Trileaflet; severely thickened, severely calcified leaflets. Valve mobility was restricted. There was moderate stenosis. There was trivial regurgitation. Mean gradient (S): 24 mm Hg. Peak gradient (S): 38 mm Hg. Valve area (VTI): 1.05 cm^2. Valve area (Vmax): 1.07 cm^2. Valve area (Vmean): 0.99 cm^2. - Mitral valve: Calcified annulus. There was mild regurgitation. - Left atrium: The atrium was mildly dilated. - Right ventricle: The cavity size was normal. Wall thickness was normal. Systolic function was normal. Systolic pressure was within the normal range. - Right atrium: The atrium was normal in size. - Tricuspid valve: There was trivial regurgitation. - Pulmonic valve: Structurally normal valve. - Pericardium, extracardiac: There was no pericardial effusion.   She stopped her cholesterol pill due to muscle pain and feels much better   ROS: Denies  fever, malais, weight loss, blurry vision, decreased visual acuity, cough, sputum, SOB, hemoptysis, pleuritic pain, palpitaitons, heartburn, abdominal pain, melena, lower extremity edema, claudication, or rash.  All other systems reviewed and negative  General: Affect appropriate Healthy:  appears stated age 70: normal Neck supple with no adenopathy JVP normal no bruits no thyromegaly Lungs clear with no wheezing and good diaphragmatic motion Heart:  S1/S2  Preserved AS  murmur, no rub, gallop or click PMI normal Abdomen: benighn, BS positve, no tenderness, no AAA no bruit.  No HSM or HJR Distal pulses intact with no bruits No edema Neuro non-focal Skin warm and dry No muscular weakness   Current Outpatient Prescriptions  Medication Sig Dispense Refill  . Aloe Vera 25 MG CAPS Take 1 capsule by mouth daily.    Marland Kitchen ascorbic acid (VITAMIN C) 1000 MG tablet Take 1,000 mg by mouth daily.    Marland Kitchen aspirin 81 MG tablet Take 81 mg by mouth daily.     Marland Kitchen BEE POLLEN PO Take 400 mg by mouth daily.    . Cholecalciferol (VITAMIN D3 SUPER STRENGTH) 2000 UNITS TABS Take 2,000 Units by mouth daily.    . Coenzyme Q10 (CO Q 10) 100 MG CAPS Take 1 capsule by mouth daily.    . cycloSPORINE (RESTASIS) 0.05 % ophthalmic emulsion Place 1 drop into both eyes 2 (two) times daily as needed.     . Digestive Enzymes (PAPAYA ENZYME PO) Take by mouth.    . metoprolol (LOPRESSOR) 50 MG tablet TAKE ONE TABLET BY MOUTH TWICE DAILY 60 tablet 0  . omeprazole (PRILOSEC) 20 MG capsule TAKE ONE CAPSULE BY MOUTH IN  THE EVENING (Patient not taking: Reported on 12/20/2014) 90  capsule 1  . traMADol (ULTRAM) 50 MG tablet Take 1 tablet (50 mg total) by mouth every 8 (eight) hours as needed. 45 tablet 0  . Vitamin D, Ergocalciferol, (DRISDOL) 50000 UNITS CAPS capsule Take 50,000 Units by mouth every 7 (seven) days. SUNDAYS     No current facility-administered medications for this visit.     Allergies  Tape  Electrocardiogram: 2.17  SR normal ECG  05/21/14  SR rate 59  Normal   Assessment and Plan Chest Pain: resolved normal cath 2008 and normal myovue 2009 AS:  Moderate no change in murmur f/u echo this month  Fibromyalgia:  Uses ultram for pain HTN:  Well controlled.  Continue current medications and low sodium Dash type diet.     Jenkins Rouge

## 2015-05-23 ENCOUNTER — Encounter: Payer: Self-pay | Admitting: Cardiovascular Disease

## 2015-05-23 ENCOUNTER — Other Ambulatory Visit: Payer: Self-pay

## 2015-05-23 ENCOUNTER — Ambulatory Visit (HOSPITAL_COMMUNITY): Payer: Medicare Other | Attending: Cardiology

## 2015-05-23 ENCOUNTER — Ambulatory Visit (INDEPENDENT_AMBULATORY_CARE_PROVIDER_SITE_OTHER): Payer: Medicare Other | Admitting: Cardiovascular Disease

## 2015-05-23 VITALS — BP 126/54 | HR 56 | Resp 18 | Ht 64.0 in | Wt 157.0 lb

## 2015-05-23 DIAGNOSIS — I071 Rheumatic tricuspid insufficiency: Secondary | ICD-10-CM | POA: Diagnosis not present

## 2015-05-23 DIAGNOSIS — I10 Essential (primary) hypertension: Secondary | ICD-10-CM

## 2015-05-23 DIAGNOSIS — Z87891 Personal history of nicotine dependence: Secondary | ICD-10-CM | POA: Insufficient documentation

## 2015-05-23 DIAGNOSIS — E785 Hyperlipidemia, unspecified: Secondary | ICD-10-CM | POA: Insufficient documentation

## 2015-05-23 DIAGNOSIS — I350 Nonrheumatic aortic (valve) stenosis: Secondary | ICD-10-CM

## 2015-05-23 DIAGNOSIS — G4733 Obstructive sleep apnea (adult) (pediatric): Secondary | ICD-10-CM | POA: Diagnosis not present

## 2015-05-23 DIAGNOSIS — I340 Nonrheumatic mitral (valve) insufficiency: Secondary | ICD-10-CM | POA: Insufficient documentation

## 2015-05-23 DIAGNOSIS — I352 Nonrheumatic aortic (valve) stenosis with insufficiency: Secondary | ICD-10-CM | POA: Diagnosis not present

## 2015-05-23 NOTE — Patient Instructions (Addendum)

## 2015-06-03 ENCOUNTER — Ambulatory Visit (INDEPENDENT_AMBULATORY_CARE_PROVIDER_SITE_OTHER): Payer: Medicare Other | Admitting: Family

## 2015-06-03 ENCOUNTER — Encounter: Payer: Self-pay | Admitting: Family

## 2015-06-03 VITALS — BP 136/64 | HR 74 | Temp 98.6°F | Ht 64.0 in | Wt 155.0 lb

## 2015-06-03 DIAGNOSIS — M25561 Pain in right knee: Secondary | ICD-10-CM

## 2015-06-03 DIAGNOSIS — R319 Hematuria, unspecified: Secondary | ICD-10-CM | POA: Diagnosis not present

## 2015-06-03 DIAGNOSIS — R1030 Lower abdominal pain, unspecified: Secondary | ICD-10-CM | POA: Diagnosis not present

## 2015-06-03 DIAGNOSIS — M1711 Unilateral primary osteoarthritis, right knee: Secondary | ICD-10-CM

## 2015-06-03 LAB — POCT URINALYSIS DIPSTICK
BILIRUBIN UA: NEGATIVE
Glucose, UA: NEGATIVE
KETONES UA: NEGATIVE
Leukocytes, UA: NEGATIVE
Nitrite, UA: NEGATIVE
PH UA: 6
Protein, UA: NEGATIVE
SPEC GRAV UA: 1.02
Urobilinogen, UA: NEGATIVE

## 2015-06-03 LAB — POCT UA - MICROSCOPIC ONLY
Bacteria, U Microscopic: NEGATIVE
Casts, Ur, LPF, POC: NEGATIVE
Crystals, Ur, HPF, POC: NEGATIVE
Epithelial cells, urine per micros: NEGATIVE
Mucus, UA: NEGATIVE
WBC, Ur, HPF, POC: NEGATIVE
Yeast, UA: NEGATIVE

## 2015-06-03 MED ORDER — BUPIVACAINE HCL 0.25 % IJ SOLN
1.0000 mL | Freq: Once | INTRAMUSCULAR | Status: AC
  Administered 2015-06-03: 1 mL via INTRA_ARTICULAR

## 2015-06-03 MED ORDER — METHYLPREDNISOLONE ACETATE 40 MG/ML IJ SUSP
40.0000 mg | Freq: Once | INTRAMUSCULAR | Status: AC
Start: 2015-06-03 — End: 2015-06-03
  Administered 2015-06-03: 40 mg via INTRA_ARTICULAR

## 2015-06-03 NOTE — Progress Notes (Signed)
Subjective:    Patient ID: [name], female    DOB: [birth year], 80 y.o.   MRN: [id number]  Abdominal Pain This is a new problem. The current episode started in the past 7 days. The onset quality is gradual. The problem occurs intermittently. The pain is located in the suprapubic region. The pain is at a severity of 4/10. The pain is mild. The quality of the pain is cramping. The abdominal pain does not radiate. Associated symptoms include constipation and frequency. Pertinent negatives include no belching, diarrhea, dysuria, headaches, nausea or vomiting. Nothing aggravates the pain. Treatments tried: heat. The treatment provided mild relief.  Knee Pain  The incident occurred more than 1 week ago. There was no injury mechanism. The pain is present in the right knee. The quality of the pain is described as aching. The pain is at a severity of 7/10. The pain is moderate. The pain has been intermittent since onset. Pertinent negatives include no loss of motion, numbness or tingling. She reports no foreign bodies present. The symptoms are aggravated by palpation. She has tried acetaminophen for the symptoms. The treatment provided mild relief.     Review of Systems  Constitutional: Negative.   HENT: Negative.   Eyes: Negative.   Respiratory: Negative.  Negative for shortness of breath.   Cardiovascular: Negative.  Negative for palpitations.  Gastrointestinal: Positive for abdominal pain and constipation. Negative for nausea, vomiting and diarrhea.  Endocrine: Negative.   Genitourinary: Positive for frequency. Negative for dysuria.  Musculoskeletal: Negative.   Neurological: Negative.  Negative for tingling, numbness and headaches.  Hematological: Negative.   Psychiatric/Behavioral: Negative.   All other systems reviewed and are negative.      Objective:   Physical Exam  Constitutional: She is oriented to person, place, and time. She appears well-developed and well-nourished. No  distress.  HENT:  Head: Normocephalic and atraumatic.  Eyes: Pupils are equal, round, and reactive to light.  Neck: Normal range of motion. Neck supple. No thyromegaly present.  Cardiovascular: Normal rate, regular rhythm and intact distal pulses.   Murmur heard. Pulmonary/Chest: Effort normal and breath sounds normal. No respiratory distress. She has no wheezes.  Abdominal: Soft. Bowel sounds are normal. She exhibits no distension. There is tenderness (mild lower abd tenderness).  Musculoskeletal: Normal range of motion. She exhibits edema (Trace in right knee). She exhibits no tenderness.  Negative for CVA tenderness   Neurological: She is alert and oriented to person, place, and time. She has normal reflexes. No cranial nerve deficit.  Skin: Skin is warm and dry.  Psychiatric: She has a normal mood and affect. Her behavior is normal. Judgment and thought content normal.  Vitals reviewed.   right knee prepped with betadine Injected with Marcaine .5% plain and methylprednisolone and marcaine with 25 guage needle. Patient tolerated well.   BP 136/64 mmHg  Pulse 74  Temp(Src) 98.6 F (37 C) (Oral)  Ht _0  (1.626 m)  Wt 155 lb (70.308 kg)  BMI 26.59 kg/m2     Assessment & Plan:  1. Lower abdominal pain - POCT UA - Microscopic Only - POCT urinalysis dipstick - BMP8+EGFR - CBC with Differential/Platelet - CT Abdomen Pelvis Wo Contrast; Future  2. Primary osteoarthritis of right knee -ROM exercises discussed -Low impact exercises encouraged - methylPREDNISolone acetate (DEPO-MEDROL) injection 40 mg; Inject 1 mL (40 mg total) into the articular space once. - bupivacaine (MARCAINE) 0.25 % (with pres) injection 1 mL; Inject 1 mL into the articular space  once. - BMP8+EGFR - CBC with Differential/Platelet  3. Hematuria -CT scan pending -Lab work pending - BMP8+EGFR - CBC with Differential/Platelet - CT Abdomen Pelvis Wo Contrast; Future  Evelina Dun, FNP

## 2015-06-03 NOTE — Patient Instructions (Addendum)
Osteoarthritis Osteoarthritis is a disease that causes soreness and inflammation of a joint. It occurs when the cartilage at the affected joint wears down. Cartilage acts as a cushion, covering the ends of bones where they meet to form a joint. Osteoarthritis is the most common form of arthritis. It often occurs in older people. The joints affected most often by this condition include those in the:  Ends of the fingers.  Thumbs.  Neck.  Lower back.  Knees.  Hips. CAUSES  Over time, the cartilage that covers the ends of bones begins to wear away. This causes bone to rub on bone, producing pain and stiffness in the affected joints.  RISK FACTORS Certain factors can increase your chances of having osteoarthritis, including:  Older age.  Excessive body weight.  Overuse of joints.  Previous joint injury. SIGNS AND SYMPTOMS   Pain, swelling, and stiffness in the joint.  Over time, the joint may lose its normal shape.  Small deposits of bone (osteophytes) may grow on the edges of the joint.  Bits of bone or cartilage can break off and float inside the joint space. This may cause more pain and damage. DIAGNOSIS  Your health care provider will do a physical exam and ask about your symptoms. Various tests may be ordered, such as:  X-rays of the affected joint.  Blood tests to rule out other types of arthritis. Additional tests may be used to diagnose your condition. TREATMENT  Goals of treatment are to control pain and improve joint function. Treatment plans may include:  A prescribed exercise program that allows for rest and joint relief.  A weight control plan.  Pain relief techniques, such as:  Properly applied heat and cold.  Electric pulses delivered to nerve endings under the skin (transcutaneous electrical nerve stimulation [TENS]).  Massage.  Certain nutritional supplements.  Medicines to control pain, such as:  Acetaminophen.  Nonsteroidal  anti-inflammatory drugs (NSAIDs), such as naproxen.  Narcotic or central-acting agents, such as tramadol.  Corticosteroids. These can be given orally or as an injection.  Surgery to reposition the bones and relieve pain (osteotomy) or to remove loose pieces of bone and cartilage. Joint replacement may be needed in advanced states of osteoarthritis. HOME CARE INSTRUCTIONS   Take medicines only as directed by your health care provider.  Maintain a healthy weight. Follow your health care provider's instructions for weight control. This may include dietary instructions.  Exercise as directed. Your health care provider can recommend specific types of exercise. These may include:  Strengthening exercises. These are done to strengthen the muscles that support joints affected by arthritis. They can be performed with weights or with exercise bands to add resistance.  Aerobic activities. These are exercises, such as brisk walking or low-impact aerobics, that get your heart pumping.  Range-of-motion activities. These keep your joints limber.  Balance and agility exercises. These help you maintain daily living skills.  Rest your affected joints as directed by your health care provider.  Keep all follow-up visits as directed by your health care provider. SEEK MEDICAL CARE IF:   Your skin turns red.  You develop a rash in addition to your joint pain.  You have worsening joint pain.  You have a fever along with joint or muscle aches. SEEK IMMEDIATE MEDICAL CARE IF:  You have a significant loss of weight or appetite. You have night sweats.Hematuria, Adult Hematuria is blood in your urine. It can be caused by a bladder infection, kidney infection, prostate infection, kidney  stone, or cancer of your urinary tract. Infections can usually be treated with medicine, and a kidney stone usually will pass through your urine. If neither of these is the cause of your hematuria, further workup to find out  the reason may be needed. It is very important that you tell your health care provider about any blood you see in your urine, even if the blood stops without treatment or happens without causing pain. Blood in your urine that happens and then stops and then happens again can be a symptom of a very serious condition. Also, pain is not a symptom in the initial stages of many urinary cancers. HOME CARE INSTRUCTIONS   Drink lots of fluid, 3-4 quarts a day. If you have been diagnosed with an infection, cranberry juice is especially recommended, in addition to large amounts of water.  Avoid caffeine, tea, and carbonated beverages because they tend to irritate the bladder.  Avoid alcohol because it may irritate the prostate.  Take all medicines as directed by your health care provider.  If you were prescribed an antibiotic medicine, finish it all even if you start to feel better.  If you have been diagnosed with a kidney stone, follow your health care provider's instructions regarding straining your urine to catch the stone.  Empty your bladder often. Avoid holding urine for long periods of time.  After a bowel movement, women should cleanse front to back. Use each tissue only once.  Empty your bladder before and after sexual intercourse if you are a female. SEEK MEDICAL CARE IF:  You develop back pain.  You have a fever.  You have a feeling of sickness in your stomach (nausea) or vomiting.  Your symptoms are not better in 3 days. Return sooner if you are getting worse. SEEK IMMEDIATE MEDICAL CARE IF:   You develop severe vomiting and are unable to keep the medicine down.  You develop severe back or abdominal pain despite taking your medicines.  You begin passing a large amount of blood or clots in your urine.  You feel extremely weak or faint, or you pass out. MAKE SURE YOU:   Understand these instructions.  Will watch your condition.  Will get help right away if you are not  doing well or get worse.   This information is not intended to replace advice given to you by your health care provider. Make sure you discuss any questions you have with your health care provider.   Document Released: 03/30/2005 Document Revised: 04/20/2014 Document Reviewed: 11/28/2012 Elsevier Interactive Patient Education 2016 [place] of Arthritis and Musculoskeletal and Skin Diseases: www.niams.SouthExposed.es  [employer] on Aging: http://kim-miller.com/  American College of Rheumatology: www.rheumatology.org   This information is not intended to replace advice given to you by your health care provider. Make sure you discuss any questions you have with your health care provider.   Document Released: 03/30/2005 Document Revised: 04/20/2014 Document Reviewed: 12/05/2012 Elsevier Interactive Patient Education [employer].

## 2015-06-04 ENCOUNTER — Telehealth: Payer: Self-pay

## 2015-06-04 LAB — BMP8+EGFR
BUN/Creatinine Ratio: 13 (ref 11–26)
BUN: 12 mg/dL (ref 8–27)
CALCIUM: 9.1 mg/dL (ref 8.7–10.3)
CO2: 25 mmol/L (ref 18–29)
CREATININE: 0.91 mg/dL (ref 0.57–1.00)
Chloride: 102 mmol/L (ref 96–106)
GFR calc Af Amer: 70 mL/min/{1.73_m2} (ref >59)
GFR, EST NON AFRICAN AMERICAN: 61 mL/min/{1.73_m2} (ref >59)
Glucose: 90 mg/dL (ref 65–99)
Potassium: 4.5 mmol/L (ref 3.5–5.2)
SODIUM: 142 mmol/L (ref 134–144)

## 2015-06-04 LAB — CBC WITH DIFFERENTIAL/PLATELET
BASOS: 1 %
Basophils Absolute: 0.1 10*3/uL (ref 0.0–0.2)
EOS (ABSOLUTE): 0.3 10*3/uL (ref 0.0–0.4)
EOS: 4 %
Hematocrit: 39.6 % (ref 34.0–46.6)
Hemoglobin: 13.5 g/dL (ref 11.1–15.9)
IMMATURE GRANULOCYTES: 0 %
Immature Grans (Abs): 0 10*3/uL (ref 0.0–0.1)
Lymphocytes Absolute: 2.1 10*3/uL (ref 0.7–3.1)
Lymphs: 32 %
MCH: 30.1 pg (ref 26.6–33.0)
MCHC: 34.1 g/dL (ref 31.5–35.7)
MCV: 88 fL (ref 79–97)
Monocytes Absolute: 0.5 10*3/uL (ref 0.1–0.9)
Monocytes: 7 %
NEUTROS PCT: 56 %
Neutrophils Absolute: 3.8 10*3/uL (ref 1.4–7.0)
Platelets: 221 10*3/uL (ref 150–379)
RBC: 4.49 x10E6/uL (ref 3.77–5.28)
RDW: 13.6 % (ref 12.3–15.4)
WBC: 6.7 10*3/uL (ref 3.4–10.8)

## 2015-06-04 NOTE — Telephone Encounter (Signed)
Called patient about her echo results. Per Dr. Johnsie Cancel, Moderate AS stable f/u echo a year. Patient verbalized understanding.

## 2015-06-11 ENCOUNTER — Ambulatory Visit (HOSPITAL_COMMUNITY)
Admission: RE | Admit: 2015-06-11 | Discharge: 2015-06-11 | Disposition: A | Discharge status: Home / Self Care | Payer: Medicare Other | Source: Ambulatory Visit | Attending: Family | Admitting: Family

## 2015-06-11 DIAGNOSIS — K5730 Diverticulosis of large intestine without perforation or abscess without bleeding: Secondary | ICD-10-CM | POA: Diagnosis not present

## 2015-06-11 DIAGNOSIS — R1030 Lower abdominal pain, unspecified: Secondary | ICD-10-CM | POA: Diagnosis not present

## 2015-06-11 DIAGNOSIS — R319 Hematuria, unspecified: Secondary | ICD-10-CM | POA: Insufficient documentation

## 2015-06-17 ENCOUNTER — Ambulatory Visit (INDEPENDENT_AMBULATORY_CARE_PROVIDER_SITE_OTHER): Payer: Medicare Other | Admitting: Family

## 2015-06-17 ENCOUNTER — Encounter: Payer: Self-pay | Admitting: Family

## 2015-06-17 VITALS — BP 132/68 | HR 65 | Temp 97.7°F | Ht 64.0 in | Wt 156.6 lb

## 2015-06-17 DIAGNOSIS — N952 Postmenopausal atrophic vaginitis: Secondary | ICD-10-CM | POA: Diagnosis not present

## 2015-06-17 DIAGNOSIS — Z01419 Encounter for gynecological examination (general) (routine) without abnormal findings: Secondary | ICD-10-CM | POA: Diagnosis not present

## 2015-06-17 DIAGNOSIS — R319 Hematuria, unspecified: Secondary | ICD-10-CM | POA: Diagnosis not present

## 2015-06-17 DIAGNOSIS — R1030 Lower abdominal pain, unspecified: Secondary | ICD-10-CM

## 2015-06-17 LAB — URINALYSIS, COMPLETE
Bilirubin, UA: NEGATIVE
Glucose, UA: NEGATIVE
KETONES UA: NEGATIVE
LEUKOCYTES UA: NEGATIVE
NITRITE UA: NEGATIVE
Protein, UA: NEGATIVE
SPEC GRAV UA: 1.02 (ref 1.005–1.030)
Urobilinogen, Ur: 0.2 mg/dL (ref 0.2–1.0)
pH, UA: 5.5 (ref 5.0–7.5)

## 2015-06-17 LAB — MICROSCOPIC EXAMINATION: Renal Epithel, UA: NONE SEEN /hpf

## 2015-06-17 NOTE — Progress Notes (Signed)
Subjective:    Patient ID: [name], female    DOB: 12-09-1935, 80 y.o.   MRN: [id number]  Pt presents to the office today for pap and continues to have lower abdomen pain. PT was seen in the office on 06/03/15 and had hematuria. PT had a negative CT scan with No acute obstructing ureteral calculus, hydronephrosis, or obstructive uropathy on either side. PT states she continues to have intermittent lower pubic pain of a 2 out 10.  Gynecologic Exam Associated symptoms include abdominal pain. Pertinent negatives include no diarrhea, dysuria, frequency or headaches.  Abdominal Pain This is a recurrent problem. The current episode started more than 1 month ago. The onset quality is gradual. The problem occurs intermittently. The problem has been unchanged. The pain is located in the suprapubic region. The pain is at a severity of 1/10. The pain is mild. The quality of the pain is cramping. The abdominal pain does not radiate. Pertinent negatives include no arthralgias, belching, diarrhea, dysuria, frequency, headaches or hematochezia. Nothing aggravates the pain. The pain is relieved by nothing. Treatments tried: heat. The treatment provided mild relief. Prior diagnostic workup includes CT scan.      Review of Systems  Constitutional: Negative.   HENT: Negative.   Eyes: Negative.   Respiratory: Negative.  Negative for shortness of breath.   Cardiovascular: Negative.  Negative for palpitations.  Gastrointestinal: Positive for abdominal pain. Negative for diarrhea and hematochezia.  Endocrine: Negative.   Genitourinary: Negative.  Negative for dysuria and frequency.  Musculoskeletal: Negative.  Negative for arthralgias.  Neurological: Negative.  Negative for headaches.  Hematological: Negative.   Psychiatric/Behavioral: Negative.   All other systems reviewed and are negative.      Objective:   Physical Exam  Constitutional: She is oriented to person, place, and time. She appears  well-developed and well-nourished. No distress.  HENT:  Head: Normocephalic and atraumatic.  Right Ear: External ear normal.  Left Ear: External ear normal.  Nose: Nose normal.  Mouth/Throat: Oropharynx is clear and moist.  Eyes: Pupils are equal, round, and reactive to light.  Neck: Normal range of motion. Neck supple. No thyromegaly present.  Cardiovascular: Normal rate, regular rhythm and intact distal pulses.   Murmur heard. Pulmonary/Chest: Effort normal and breath sounds normal. No respiratory distress. She has no wheezes.  Abdominal: Soft. Bowel sounds are normal. She exhibits no distension. There is tenderness. There is guarding.  Genitourinary: Vagina normal. Right adnexum displays tenderness. Left adnexum displays tenderness. No vaginal discharge found.  Bimanual exam- no adnexal masses Tenderness present Cervix not present No discharge, atrophy changes present    Musculoskeletal: Normal range of motion. She exhibits no edema or tenderness.  Neurological: She is alert and oriented to person, place, and time. She has normal reflexes. No cranial nerve deficit.  Skin: Skin is warm and dry.  Psychiatric: She has a normal mood and affect. Her behavior is normal. Judgment and thought content normal.  Vitals reviewed.   BP 132/68 mmHg  Pulse 65  Temp(Src) 97.7 F (36.5 C) (Oral)  Ht 5\' 4"  (1.626 m)  Wt 156 lb 9.6 oz (71.033 kg)  BMI 26.87 kg/m2       Assessment & Plan:  1. Encounter for routine gynecological examination -Pap pending - Urinalysis, Complete - Pap IG (Image Guided) - CBC with Differential/Platelet  2. Lower abdominal pain - Pap IG (Image Guided) - CBC with Differential/Platelet  3. Hematuria -I believe this may be related to atrophic vaginitis- If pap  is negative will send in premarin cream - Pap IG (Image Guided) - CBC with Differential/Platelet  4. Vaginal atrophy    Continue all meds Labs pending Health Maintenance reviewed Diet and  exercise encouraged RTO as needed and keep chronic follow up appts  Evelina Dun, FNP

## 2015-06-17 NOTE — Patient Instructions (Addendum)
Hematuria, Adult Hematuria is blood in your urine. It can be caused by a bladder infection, kidney infection, prostate infection, kidney stone, or cancer of your urinary tract. Infections can usually be treated with medicine, and a kidney stone usually will pass through your urine. If neither of these is the cause of your hematuria, further workup to find out the reason may be needed. It is very important that you tell your health care provider about any blood you see in your urine, even if the blood stops without treatment or happens without causing pain. Blood in your urine that happens and then stops and then happens again can be a symptom of a very serious condition. Also, pain is not a symptom in the initial stages of many urinary cancers. HOME CARE INSTRUCTIONS   Drink lots of fluid, 3-4 quarts a day. If you have been diagnosed with an infection, cranberry juice is especially recommended, in addition to large amounts of water.  Avoid caffeine, tea, and carbonated beverages because they tend to irritate the bladder.  Avoid alcohol because it may irritate the prostate.  Take all medicines as directed by your health care provider.  If you were prescribed an antibiotic medicine, finish it all even if you start to feel better.  If you have been diagnosed with a kidney stone, follow your health care provider's instructions regarding straining your urine to catch the stone.  Empty your bladder often. Avoid holding urine for long periods of time.  After a bowel movement, women should cleanse front to back. Use each tissue only once.  Empty your bladder before and after sexual intercourse if you are a female. SEEK MEDICAL CARE IF:  You develop back pain.  You have a fever.  You have a feeling of sickness in your stomach (nausea) or vomiting.  Your symptoms are not better in 3 days. Return sooner if you are getting worse. SEEK IMMEDIATE MEDICAL CARE IF:   You develop severe vomiting and  are unable to keep the medicine down.  You develop severe back or abdominal pain despite taking your medicines.  You begin passing a large amount of blood or clots in your urine.  You feel extremely weak or faint, or you pass out. MAKE SURE YOU:   Understand these instructions.  Will watch your condition.  Will get help right away if you are not doing well or get worse.   This information is not intended to replace advice given to you by your health care provider. Make sure you discuss any questions you have with your health care provider.   Document Released: 03/30/2005 Document Revised: 04/20/2014 Document Reviewed: 11/28/2012 Elsevier Interactive Patient Education 2016 Elsevier Inc. Atrophic Vaginitis Atrophic vaginitis is a condition in which the tissues that line the vagina become dry and thin. This condition is most common in women who have stopped having regular menstrual periods (menopause). This usually starts when a woman is 48-41 years old. Estrogen helps to keep the vagina moist. It stimulates the vagina to produce a clear fluid that lubricates the vagina for sexual intercourse. This fluid also protects the vagina from infection. Lack of estrogen can cause the lining of the vagina to get thinner and dryer. The vagina may also shrink in size. It may become less elastic. Atrophic vaginitis tends to get worse over time as a woman's estrogen level drops. CAUSES This condition is caused by the normal drop in estrogen that happens around the time of menopause. RISK FACTORS Certain conditions or  situations may lower a woman's estrogen level, which increases her risk of atrophic vaginitis. These include:  Taking medicine that blocks estrogen.  Having ovaries removed surgically.  Being treated for cancer with X-ray treatment (radiation) or medicines (chemotherapy).  Exercising very hard and often.  Having an eating disorder (anorexia).  Giving birth or breastfeeding.  Being  over the age of 55.  Smoking. SYMPTOMS Symptoms of this condition include:  Pain, soreness, or bleeding during sexual intercourse (dyspareunia).  Vaginal burning, irritation, or itching.  Pain or bleeding during a vaginal examination using a speculum (pelvic exam).  Loss of interest in sexual activity.  Having burning pain when passing urine.  Vaginal discharge that is brown or yellow. In some cases, there are no symptoms. DIAGNOSIS This condition is diagnosed with a medical history and physical exam. This will include a pelvic exam that checks whether the inside of your vagina appears pale, thin, or dry. Rarely, you may also have other tests, including:  A urine test.  A test that checks the acid balance in your vaginal fluid (acid balance test). TREATMENT Treatment for this condition may depend on the severity of your symptoms. Treatment may include:  Using an over-the-counter vaginal lubricant before you have sexual intercourse.  Using a long-acting vaginal moisturizer.  Using low-dose vaginal estrogen for moderate to severe symptoms that do not respond to other treatments. Options include creams, tablets, and inserts (vaginal rings). Before using vaginal estrogen, tell your health care provider if you have a history of:  Breast cancer.  Endometrial cancer.  Blood clots.  Taking medicines. You may be able to take a daily pill for dyspareunia. Discuss all of the risks of this medicine with your health care provider. It is usually not recommended for women who have a family history or personal history of breast cancer. If your symptoms are very mild and you are not sexually active, you may not need treatment. HOME CARE INSTRUCTIONS  Take medicines only as directed by your health care provider. Do not use herbal or alternative medicines unless your health care provider says that you can.  Use over-the-counter creams, lubricants, or moisturizers for dryness only as  directed by your health care provider.  If your atrophic vaginitis is caused by menopause, discuss all of your menopausal symptoms and treatment options with your health care provider.  Do not douche.  Do not use products that can make your vagina dry. These include:  Scented feminine sprays.  Scented tampons.  Scented soaps.  If it hurts to have sex, talk with your sexual partner. SEEK MEDICAL CARE IF:  Your discharge looks different than normal.  Your vagina has an unusual smell.  You have new symptoms.  Your symptoms do not improve with treatment.  Your symptoms get worse.   This information is not intended to replace advice given to you by your health care provider. Make sure you discuss any questions you have with your health care provider.   Document Released: 08/14/2014 Document Reviewed: 08/14/2014 Elsevier Interactive Patient Education [employer].

## 2015-06-18 LAB — CBC WITH DIFFERENTIAL/PLATELET
BASOS ABS: 0 10*3/uL (ref 0.0–0.2)
Basos: 1 %
EOS (ABSOLUTE): 0.2 10*3/uL (ref 0.0–0.4)
Eos: 3 %
Hematocrit: 39.4 % (ref 34.0–46.6)
Hemoglobin: 13 g/dL (ref 11.1–15.9)
IMMATURE GRANS (ABS): 0 10*3/uL (ref 0.0–0.1)
IMMATURE GRANULOCYTES: 0 %
LYMPHS: 28 %
Lymphocytes Absolute: 2 10*3/uL (ref 0.7–3.1)
MCH: 29.9 pg (ref 26.6–33.0)
MCHC: 33 g/dL (ref 31.5–35.7)
MCV: 91 fL (ref 79–97)
Monocytes Absolute: 0.4 10*3/uL (ref 0.1–0.9)
Monocytes: 6 %
NEUTROS PCT: 62 %
Neutrophils Absolute: 4.3 10*3/uL (ref 1.4–7.0)
Platelets: 221 10*3/uL (ref 150–379)
RBC: 4.35 x10E6/uL (ref 3.77–5.28)
RDW: 13.4 % (ref 12.3–15.4)
WBC: 6.9 10*3/uL (ref 3.4–10.8)

## 2015-06-19 LAB — PAP IG (IMAGE GUIDED): PAP Smear Comment: 0

## 2015-06-20 ENCOUNTER — Other Ambulatory Visit: Payer: Self-pay | Admitting: Family

## 2015-06-20 DIAGNOSIS — N952 Postmenopausal atrophic vaginitis: Secondary | ICD-10-CM

## 2015-06-20 MED ORDER — SULFAMETHOXAZOLE-TRIMETHOPRIM 800-160 MG PO TABS: 1.0000 | ORAL_TABLET | Freq: Two times a day (BID) | ORAL | Status: DC

## 2015-06-20 MED ORDER — ESTROGENS, CONJUGATED 0.625 MG/GM VA CREA: 1.0000 | TOPICAL_CREAM | Freq: Every day | VAGINAL | Status: DC

## 2015-06-20 NOTE — Addendum Note (Signed)
Addended by: Evelina Dun A on: 06/20/2015 11:30 AM   Modules accepted: SmartSet

## 2015-10-16 ENCOUNTER — Ambulatory Visit (INDEPENDENT_AMBULATORY_CARE_PROVIDER_SITE_OTHER): Payer: Medicare Other | Admitting: Physician Assistant

## 2015-10-16 ENCOUNTER — Encounter: Payer: Self-pay | Admitting: Physician Assistant

## 2015-10-16 VITALS — BP 172/81 | HR 85 | Temp 97.5°F | Ht 64.0 in | Wt 155.6 lb

## 2015-10-16 DIAGNOSIS — H539 Unspecified visual disturbance: Secondary | ICD-10-CM

## 2015-10-16 NOTE — Patient Instructions (Signed)
Visual Disturbances °You have had a disturbance in your vision. This may be caused by various conditions, such as: °· Migraines. Migraine headaches are often preceded by a disturbance in vision. Blind spots or light flashes are followed by a headache. This type of visual disturbance is temporary. It does not damage the eye. °· Glaucoma. This is caused by increased pressure in the eye. Symptoms include haziness, blurred vision, or seeing rainbow colored circles when looking at bright lights. Partial or complete visual loss can occur. You may or may not experience eye pain. Visual loss may be gradual or sudden and is irreversible. Glaucoma is the leading cause of blindness. °· Retina problems. Vision will be reduced if the retina becomes detached or if there is a circulation problem as with diabetes, high blood pressure, or a mini-stroke. Symptoms include seeing "floaters," flashes of light, or shadows, as if a curtain has fallen over your eye. °· Optic nerve problems. The main nerve in your eye can be damaged by redness, soreness, and swelling (inflammation), poor circulation, drugs, and toxins. °It is very important to have a complete exam done by a specialist to determine the exact cause of your eye problem. The specialist may recommend medicines or surgery, depending on the cause of the problem. This can help prevent further loss of vision or reduce the risk of having a stroke. Contact the caregiver to whom you have been referred and arrange for follow-up care right away. °SEEK IMMEDIATE MEDICAL CARE IF:  °· Your vision gets worse. °· You develop severe headaches. °· You have any weakness or numbness in the face, arms, or legs. °· You have any trouble speaking or walking. °  °This information is not intended to replace advice given to you by your health care provider. Make sure you discuss any questions you have with your health care provider. °  °Document Released: 05/07/2004 Document Revised: 06/22/2011 Document  Reviewed: 09/06/2013 °Elsevier Interactive Patient Education ©2016 Elsevier Inc. ° °

## 2015-10-16 NOTE — Progress Notes (Signed)
Subjective:     Patient ID: [name], female   DOB: 1935-05-14, 80 y.o.   MRN: [id number]  HPI Pt with sudden onset of visual change to the L eye Denies trauma Hx of cataract removal Feels if a current has been placed over the eye Since then having HA and assoc nausea  Review of Systems  Constitutional: Negative.   Eyes: Positive for photophobia, pain and visual disturbance.  Respiratory: Negative.   Cardiovascular: Negative.   Neurological: Positive for headaches. Negative for dizziness, facial asymmetry, weakness and light-headedness.       Objective:   Physical Exam  Constitutional: She appears well-developed and well-nourished.  HENT:  Mouth/Throat: Oropharynx is clear and moist. No oropharyngeal exudate.  Eyes: Conjunctivae and EOM are normal. Pupils are equal, round, and reactive to light.  Unable to do full opth exam due to implants Decrease in preph vision on the R  Cardiovascular: Normal rate and regular rhythm.   3/6 sys murmur  Pulmonary/Chest: Effort normal and breath sounds normal.  Nursing note and vitals reviewed. Vision per chart     Assessment:     1. Visual changes         Plan:     Contacted Opth. Office Appt made for tomm @12 .45 She is aware not to eat or drink after midnight If sx worsen she is to immed go to ER F/U here prn

## 2015-10-17 DIAGNOSIS — H43813 Vitreous degeneration, bilateral: Secondary | ICD-10-CM | POA: Diagnosis not present

## 2015-10-17 DIAGNOSIS — H33321 Round hole, right eye: Secondary | ICD-10-CM | POA: Diagnosis not present

## 2015-10-18 ENCOUNTER — Telehealth: Payer: Self-pay | Admitting: Family

## 2015-10-18 NOTE — Telephone Encounter (Signed)
Patient went to eye doctor and was examined, had a small tear/hole in right retina which was repaired by lasix surgery, has another followup with them next week.  In the meantime, she is still experiencing neck pain, headaches and nausea.  She said she had mentioned this to Osa Craver at the appointment and just wanted to get your opinion and see what you think and recommend, patient is aware that you are out of the office until Monday.

## 2015-10-21 ENCOUNTER — Telehealth: Payer: Self-pay | Admitting: Family

## 2015-10-21 DIAGNOSIS — I10 Essential (primary) hypertension: Secondary | ICD-10-CM

## 2015-10-21 DIAGNOSIS — R51 Headache: Secondary | ICD-10-CM

## 2015-10-21 DIAGNOSIS — R519 Headache, unspecified: Secondary | ICD-10-CM

## 2015-10-21 DIAGNOSIS — E785 Hyperlipidemia, unspecified: Secondary | ICD-10-CM

## 2015-10-21 MED ORDER — ONDANSETRON HCL 4 MG PO TABS: 4.0000 mg | ORAL_TABLET | Freq: Three times a day (TID) | ORAL | Status: DC | PRN

## 2015-10-21 NOTE — Telephone Encounter (Signed)
Spoke with pt regarding reommendation from [facility] Per pt, eye Dr wants PCP to call Please advise

## 2015-10-21 NOTE — Telephone Encounter (Signed)
PT needs to keep follow up appts with eye doctor. Will send in rx for nausea. If vision improves and continues to have nausea and headache pt will need to be seen.

## 2015-10-22 NOTE — Telephone Encounter (Signed)
Talked to patient. Told patient to call Cardiologists about ordering ultrasound of carotid. Pt states she was told that she needed a CT scan of her head because of her headaches. I will order for patient. PT told she needs to make follow up appt.

## 2015-11-04 ENCOUNTER — Ambulatory Visit (HOSPITAL_COMMUNITY)
Admission: RE | Admit: 2015-11-04 | Discharge: 2015-11-04 | Disposition: A | Discharge status: Home / Self Care | Payer: Medicare Other | Source: Ambulatory Visit | Attending: Family | Admitting: Family

## 2015-11-04 DIAGNOSIS — I672 Cerebral atherosclerosis: Secondary | ICD-10-CM | POA: Diagnosis not present

## 2015-11-04 DIAGNOSIS — R51 Headache: Secondary | ICD-10-CM | POA: Insufficient documentation

## 2015-11-04 DIAGNOSIS — R519 Headache, unspecified: Secondary | ICD-10-CM

## 2015-11-04 DIAGNOSIS — G319 Degenerative disease of nervous system, unspecified: Secondary | ICD-10-CM | POA: Diagnosis not present

## 2015-11-05 ENCOUNTER — Other Ambulatory Visit (HOSPITAL_COMMUNITY): Payer: Self-pay | Admitting: Ophthalmology

## 2015-11-05 ENCOUNTER — Telehealth: Payer: Self-pay

## 2015-11-05 DIAGNOSIS — H539 Unspecified visual disturbance: Secondary | ICD-10-CM

## 2015-11-05 NOTE — Telephone Encounter (Signed)
-----   Message from Sharion Balloon, [place] sent at 11/05/2015 12:47 PM EDT ----- CT negative or mass, hemorrhage

## 2015-11-06 ENCOUNTER — Ambulatory Visit (HOSPITAL_COMMUNITY)
Admission: RE | Admit: 2015-11-06 | Discharge: 2015-11-06 | Disposition: A | Discharge status: Home / Self Care | Payer: Medicare Other | Source: Ambulatory Visit | Attending: Cardiovascular Disease | Admitting: Cardiovascular Disease

## 2015-11-06 DIAGNOSIS — E785 Hyperlipidemia, unspecified: Secondary | ICD-10-CM | POA: Insufficient documentation

## 2015-11-06 DIAGNOSIS — G4733 Obstructive sleep apnea (adult) (pediatric): Secondary | ICD-10-CM | POA: Diagnosis not present

## 2015-11-06 DIAGNOSIS — I6523 Occlusion and stenosis of bilateral carotid arteries: Secondary | ICD-10-CM | POA: Diagnosis not present

## 2015-11-06 DIAGNOSIS — H539 Unspecified visual disturbance: Secondary | ICD-10-CM | POA: Diagnosis not present

## 2015-11-06 DIAGNOSIS — I2510 Atherosclerotic heart disease of native coronary artery without angina pectoris: Secondary | ICD-10-CM | POA: Diagnosis not present

## 2015-11-06 DIAGNOSIS — I10 Essential (primary) hypertension: Secondary | ICD-10-CM | POA: Insufficient documentation

## 2015-11-06 DIAGNOSIS — K219 Gastro-esophageal reflux disease without esophagitis: Secondary | ICD-10-CM | POA: Diagnosis not present

## 2016-04-21 ENCOUNTER — Encounter: Payer: Self-pay | Admitting: Pharmacist

## 2016-04-21 ENCOUNTER — Ambulatory Visit (INDEPENDENT_AMBULATORY_CARE_PROVIDER_SITE_OTHER): Payer: Medicare Other | Admitting: Pharmacist

## 2016-04-21 VITALS — BP 136/72 | HR 58 | Ht 62.0 in | Wt 159.0 lb

## 2016-04-21 DIAGNOSIS — Z0000 Encounter for general adult medical examination without abnormal findings: Secondary | ICD-10-CM | POA: Diagnosis not present

## 2016-04-21 DIAGNOSIS — M8580 Other specified disorders of bone density and structure, unspecified site: Secondary | ICD-10-CM | POA: Diagnosis not present

## 2016-04-21 DIAGNOSIS — M25561 Pain in right knee: Secondary | ICD-10-CM

## 2016-04-21 DIAGNOSIS — R7989 Other specified abnormal findings of blood chemistry: Secondary | ICD-10-CM

## 2016-04-21 DIAGNOSIS — M810 Age-related osteoporosis without current pathological fracture: Secondary | ICD-10-CM | POA: Insufficient documentation

## 2016-04-21 DIAGNOSIS — E782 Mixed hyperlipidemia: Secondary | ICD-10-CM

## 2016-04-21 DIAGNOSIS — H539 Unspecified visual disturbance: Secondary | ICD-10-CM

## 2016-04-21 DIAGNOSIS — R194 Change in bowel habit: Secondary | ICD-10-CM

## 2016-04-21 DIAGNOSIS — G8929 Other chronic pain: Secondary | ICD-10-CM

## 2016-04-21 DIAGNOSIS — K5900 Constipation, unspecified: Secondary | ICD-10-CM

## 2016-04-21 NOTE — Progress Notes (Signed)
Patient ID: [name], female   DOB: 1935-08-02, 81 y.o.   MRN: [id number]     Subjective:   [name] is a 81 y.o. female who presents for a subsequent Medicare Annual Wellness Visit.  [name] is married.  She has 6 children (3 of which are step children).  She is active in her church and with her family.   [name] is concerned about continued right knee pain.  She has had 2 steroid injections but they are not relieving pain and discomfort like in past.  She has been considering knee surgery.   She also mentions that she has been experiencing more constipation and change in stool.  Reports BM about every 2 to 3 days and that stool is smaller and in "strings"   Uses senokot and Miralax with some success.   Patient also c/o vaginal itching and rash.   Current Medications (verified) Outpatient Encounter Prescriptions as of 04/21/2016  Medication Sig  . Aloe Vera 25 MG CAPS Take 1 capsule by mouth daily.  Marland Kitchen ascorbic acid (VITAMIN C) 1000 MG tablet Take 1,000 mg by mouth daily.  Marland Kitchen aspirin 81 MG tablet Take 81 mg by mouth daily.   Marland Kitchen BEE POLLEN PO Take 400 mg by mouth daily.  . clobetasol cream (TEMOVATE) 2.77 % Apply 1 application topically 2 (two) times daily as needed.  . Coenzyme Q10 (CO Q 10) 100 MG CAPS Take 1 capsule by mouth daily.  Marland Kitchen conjugated estrogens (PREMARIN) vaginal cream Place 1 Applicatorful vaginally daily.  . metoprolol (LOPRESSOR) 50 MG tablet TAKE ONE TABLET BY MOUTH TWICE DAILY  . Misc Natural Products (TUMERSAID) TABS Take 75 mg by mouth daily.  . polyethylene glycol powder (GLYCOLAX/MIRALAX) powder Take 17 Containers by mouth daily.  Orlie Dakin Sodium 8.6-50 MG CAPS Take 1 capsule by mouth as needed.  . [DISCONTINUED] ondansetron (ZOFRAN) 4 MG tablet Take 1 tablet (4 mg total) by mouth every 8 (eight) hours as needed for nausea or vomiting. (Patient not taking: Reported on 04/21/2016)  . [DISCONTINUED] sulfamethoxazole-trimethoprim (BACTRIM DS) 800-160 MG  tablet Take 1 tablet by mouth 2 (two) times daily. (Patient not taking: Reported on 04/21/2016)   No facility-administered encounter medications on file as of 04/21/2016.     Allergies (verified) Tape   History: Past Medical History:  Diagnosis Date  . Anemia   . Arthritis   . Bruises easily   . CAD (coronary artery disease) CARDIOLOGIST- DR Jenkins [place]-- LAST VISIT IN [facility]  . Cataracts, bilateral   . Chronic headaches   . Complication of anesthesia    bad reaction to ether  . Diverticulosis of colon   . Eczema   . Fibromyalgia   . GERD (gastroesophageal reflux disease)   . H/O hypoglycemia   . Heart murmur   . Hemorrhoids    INTERNAL--  POST BANDING 02/ 2015  . History of adenomatous polyp of colon   . History of colon polyps   . History of kidney stones   . History of lower GI bleeding    SECONDARY TO INTERNAL HEMORRHOIDS 02/  2015--  RESOLVED  . Hyperlipidemia   . Hypertension   . Hypothyroidism, postsurgical   . Moderate aortic stenosis   . OSA (obstructive sleep apnea) CPAP INTOLERANT   . Spinal stenosis, multilevel   . Spondylosis, cervical   . Tenosynovitis of wrist    LEFT   Past Surgical History:  Procedure Laterality Date  . ANTERIOR CERVICAL DECOMP/DISCECTOMY FUSION  11-11-1999   C5 - C6  . APPENDECTOMY  AGE 29  . CARDIAC CATHETERIZATION  2008  DR [facility]   ESSENTIALLY NORMAL  . CATARACT EXTRACTION W/ INTRAOCULAR LENS  IMPLANT, BILATERAL  2012  . FLEXIBLE SIGMOIDOSCOPY N/A 06/01/2013   Procedure: FLEXIBLE SIGMOIDOSCOPY;  Surgeon: Inda Castle, MD;  Location: [facility];  Service: Endoscopy;  Laterality: N/A;  may need hemorrhoidal banding  . HEMORRHOIDECTOMY WITH HEMORRHOID BANDING  08-07-2010  . LAPAROSCOPIC CHOLECYSTECTOMY  1990  . SHOULDER ARTHROSCOPY WITH OPEN ROTATOR CUFF REPAIR AND DISTAL CLAVICLE ACROMINECTOMY Right 05/25/2012   Procedure: SHOULDER ARTHROSCOPY WITH OPEN ROTATOR CUFF REPAIR AND DISTAL CLAVICLE ACROMINECTOMY;  Surgeon: Magnus Sinning, MD;  Location: [facility];  Service: Orthopedics;  Laterality: Right;  RIGHT SHOULDER ARTHROSCOPY WITH DERBRIDEMENTOF LABRAL/BICEP TENDON, OPEN DISTAL CLAVICLE RESECTION, ANTERIOR ACROMINECTOMY ROTATOR CUFF REPAIR ANESTHESIA: GENERAL/SCALENE NERVE BLOCK  . TENOSYNOVECTOMY Left 01/04/2014   Procedure: LEFT WRIST EXTENSOR TENOSYNOVECTOMY;  Surgeon: Linna Hoff, MD;  Location: [facility];  Service: Orthopedics;  Laterality: Left;  . THYROIDECTOMY  AGE 17   GOITER  . TONSILLECTOMY AND ADENOIDECTOMY  AGE 70  . TRANSTHORACIC ECHOCARDIOGRAM  last one 04-20-2013  DR [facility]    NORMAL LVSF/ EF 60-65%/ MODERATE  AV  STENOSIS WITH NO AR /  MILD LAE  . UMBILICAL HERNIA REPAIR  JUNE 2006  . VAGINAL HYSTERECTOMY  01-31-2001   ANTERIOR & POSTERIOR REPAIR/ TRANSVAGINAL BLADDER SLING   Family History  Problem Relation Age of Onset  . Cirrhosis Mother     drinking  . Other Brother     duodenal ulcer  . Heart disease Sister   . Cancer Son 59    colon  . Gallbladder disease Maternal Grandmother   . Stomach cancer Paternal Grandfather    Social History   Occupational History  . Not on file.   Social History Main Topics  . Smoking status: Former Smoker    Packs/day: 0.25    Years: 5.00    Types: Cigarettes    Quit date: 04/14/2003  . Smokeless tobacco: Never Used  . Alcohol use Yes     Comment: once in a while-social  . Drug use: No  . Sexual activity: Not Currently    Do you feel safe at home?  Yes Are there smokers in your home (other than you)? No  Dietary issues and exercise activities: Current Exercise Habits: The patient does not participate in regular exercise at present  Current Dietary habits:  Not following any specific diet   Objective:    Today's Vitals   04/21/16 1452  BP: 136/72  Pulse: (!) 58  Weight: 159 lb (72.1 kg)  Height: 5' 2"  (1.575 m)  PainSc: 2   PainLoc: Knee   Body mass index is 29.08 kg/m.  Activities of  Daily Living In your present state of health, do you have any difficulty performing the following activities: 04/21/2016  Hearing? N  Vision? Y  Difficulty concentrating or making decisions? N  Walking or climbing stairs? Y  Dressing or bathing? N  Doing errands, shopping? N  Preparing Food and eating ? N  Using the Toilet? N  In the past six months, have you accidently leaked urine? N  Do you have problems with loss of bowel control? N  Managing your Medications? N  Managing your Finances? N  Housekeeping or managing your Housekeeping? N  Some recent data might be hidden     Cardiac Risk Factors  include: advanced age (>21mn, >>37women);dyslipidemia;hypertension;obesity (BMI >30kg/m2);sedentary lifestyle  Depression Screen PHQ 2/9 Scores 04/21/2016 10/16/2015 06/03/2015 11/08/2014  PHQ - 2 Score 0 0 0 0     Fall Risk Fall Risk  04/21/2016 06/03/2015 11/08/2014 11/08/2014 10/23/2014  Falls in the past year? No No No No No  Risk for fall due to : - - History of fall(s) - -  Risk for fall due to (comments): - - - - -    Cognitive Function: MMSE - Mini Mental State Exam 04/21/2016 11/08/2014 07/26/2014  Orientation to time 5 5 5   Orientation to Place 5 5 5   Registration 3 3 3   Attention/ Calculation 5 5 5   Recall 3 3 3   Language- name 2 objects 2 2 2   Language- repeat 1 1 1   Language- follow 3 step command 3 3 3   Language- read & follow direction 1 1 1   Write a sentence 1 1 1   Copy design 1 1 1   Total score 30 30 30     Immunizations and Health Maintenance Immunization History  Administered Date(s) Administered  . Influenza,inj,Quad PF,36+ Mos 02/01/2013   Health Maintenance Due  Topic Date Due  . MAMMOGRAM  08/06/2015    Patient Care Team: CSharion Balloon FNP as PCP - General (Nurse Practitioner) DChipper Herb MD as Attending Physician (Family Medicine) PJosue Hector MD as Consulting Physician (Cardiology) RInda Castle MD as Consulting Physician  (Gastroenterology) JMagnus Sinning MD (Inactive) as Consulting Physician (Orthopedic Surgery) MClearnce Sorrel MD as Referring Physician (Neurology) MRutherford Guys MD as Consulting Physician (Ophthalmology)  Indicate any recent Medical Services you may have received from other than [facility] providers in the past year (date may be approximate).    Assessment:    Annual Wellness Visit  Knee pain Change in bowel and constipation Vaginal itching / rash Vision changes / smokey film over eyes.   Screening Tests Health Maintenance  Topic Date Due  . MAMMOGRAM  08/06/2015  . INFLUENZA VACCINE  07/11/2016 (Originally 11/12/2015)  . ZOSTAVAX  04/13/2017 (Originally 09/03/1997)  . TETANUS/TDAP  06/17/2018 (Originally 09/03/1956)  . PNA vac Low Risk Adult (1 of 2 - PCV13) 06/17/2018 (Originally 09/04/2002)  . DEXA SCAN  07/25/2016        Plan:   During the course of the visit [name] was educated and counseled about the following appropriate screening and preventive services:   Vaccines to include Pneumoccal, Influenza, Td, Zostavax - patient denied pneumonia vaccines, Tdap and zostavax.  UTD on influenza  Colorectal cancer screening - referral send to GI for evaluation and colonoscopy  Cardiovascular disease screening - EKG and ECHO is UTD - sees Dr NJohnsie Cancelyearly  Diabetes screening - FBG was WNL 05/2015  Bone Denisty / Osteoporosis Screening - next due 07/2016  Mammogram - appt made for March 2018  PAP - UTD  Glaucoma screening / Diabetic Eye Exam - referral send to Dr RCelesta Aver  Nutrition counseling - increase fruits and vegetables whole grains and proteins  Advanced Directives - requested  copy  Physical Activity - wait for evaluation by ortho for knee pain then consider starting walking or other exercise.   appt made with PCP to evaluate vaginal itching / rash  Orders Placed This Encounter  Procedures  . Lipid panel  . VITAMIN D 25 Hydroxy (Vit-D Deficiency, Fractures)  .  Thyroid Panel With TSH  . CBC with Differential/Platelet  . CMP14+EGFR  . Specimen status report  . Ambulatory referral  to Orthopedic Surgery    Referral Priority:   Routine    Referral Type:   Surgical    Referral Reason:   Specialty Services Required    Requested Specialty:   Orthopedic Surgery    Number of Visits Requested:   1  . Ambulatory referral to Gastroenterology    Referral Priority:   Routine    Referral Type:   Consultation    Referral Reason:   Specialty Services Required    Number of Visits Requested:   1    Patient Instructions (the written plan) were given to the patient.   Cherre Robins, PharmD   04/22/2016

## 2016-04-21 NOTE — Patient Instructions (Addendum)
[name] , Thank you for taking time to come for your Medicare Wellness Visit. I appreciate your ongoing commitment to your health goals. Please review the following plan we discussed and let me know if I can assist you in the future.   These are the goals we discussed:  Sent referral for evaluation by orthopedist  Recommend make appointment with optometrist for eye exam - sent referral for Dr Vernie Shanks   Look for copy of [place] (important to know where these are kept - you can also bring copy to our office to be placed in our file / electronic chart)   This is a list of the screening recommended for you and due dates:  Health Maintenance  Topic Date Due  . Shingles Vaccine  09/03/1997  . Mammogram  08/06/2015 - appointment made  . Flu Shot  07/11/2016*  . Tetanus Vaccine  06/17/2018*  . Pneumonia vaccines (1 of 2 - PCV13) 06/17/2018*  . DEXA scan (bone density measurement)  07/25/2016 - will send referral to get this year  . Pap Smear  06/16/2017  *Topic was postponed. The date shown is not the original due date.    Health Maintenance for Postmenopausal Women Introduction Menopause is a normal process in which your reproductive ability comes to an end. This process happens gradually over a span of months to years, usually between the ages of 58 and 3. Menopause is complete when you have missed 12 consecutive menstrual periods. It is important to talk with your health care provider about some of the most common conditions that affect postmenopausal women, such as heart disease, cancer, and bone loss (osteoporosis). Adopting a healthy lifestyle and getting preventive care can help to promote your health and wellness. Those actions can also lower your chances of developing some of these common conditions. What should I know about menopause? During menopause, you may experience a number of symptoms, such  as:  Moderate-to-severe hot flashes.  Night sweats.  Decrease in sex drive.  Mood swings.  Headaches.  Tiredness.  Irritability.  Memory problems.  Insomnia. Choosing to treat or not to treat menopausal changes is an individual decision that you make with your health care provider. What should I know about hormone replacement therapy and supplements? Hormone therapy products are effective for treating symptoms that are associated with menopause, such as hot flashes and night sweats. Hormone replacement carries certain risks, especially as you become older. If you are thinking about using estrogen or estrogen with progestin treatments, discuss the benefits and risks with your health care provider. What should I know about heart disease and stroke? Heart disease, heart attack, and stroke become more likely as you age. This may be due, in part, to the hormonal changes that your body experiences during menopause. These can affect how your body processes dietary fats, triglycerides, and cholesterol. Heart attack and stroke are both medical emergencies. There are many things that you can do to help prevent heart disease and stroke:  Have your blood pressure checked at least every 1-2 years. High blood pressure causes heart disease and increases the risk of stroke.  If you are 55-7 years old, ask your health care provider if you should take aspirin to prevent a heart attack or a stroke.  Do not use any tobacco products, including cigarettes, chewing tobacco, or electronic cigarettes. If you need help quitting, ask your health care provider.  It is important to eat a  healthy diet and maintain a healthy weight.  Be sure to include plenty of vegetables, fruits, low-fat dairy products, and lean protein.  Avoid eating foods that are high in solid fats, added sugars, or salt (sodium).  Get regular exercise. This is one of the most important things that you can do for your health.  Try to  exercise for at least 150 minutes each week. The type of exercise that you do should increase your heart rate and make you sweat. This is known as moderate-intensity exercise.  Try to do strengthening exercises at least twice each week. Do these in addition to the moderate-intensity exercise.  Know your numbers.Ask your health care provider to check your cholesterol and your blood glucose. Continue to have your blood tested as directed by your health care provider. What should I know about cancer screening? There are several types of cancer. Take the following steps to reduce your risk and to catch any cancer development as early as possible. Breast Cancer  Practice breast self-awareness.  This means understanding how your breasts normally appear and feel.  It also means doing regular breast self-exams. Let your health care provider know about any changes, no matter how small.  If you are 92 or older, have a clinician do a breast exam (clinical breast exam or CBE) every year. Depending on your age, family history, and medical history, it may be recommended that you also have a yearly breast X-ray (mammogram).  If you have a family history of breast cancer, talk with your health care provider about genetic screening.  If you are at high risk for breast cancer, talk with your health care provider about having an MRI and a mammogram every year.  Breast cancer (BRCA) gene test is recommended for women who have family members with BRCA-related cancers. Results of the assessment will determine the need for genetic counseling and BRCA1 and for BRCA2 testing. BRCA-related cancers include these types:  Breast. This occurs in males or females.  Ovarian.  Tubal. This may also be called fallopian tube cancer.  Cancer of the abdominal or pelvic lining (peritoneal cancer).  Prostate.  Pancreatic. Cervical, Uterine, and Ovarian Cancer  Your health care provider may recommend that you be screened  regularly for cancer of the pelvic organs. These include your ovaries, uterus, and vagina. This screening involves a pelvic exam, which includes checking for microscopic changes to the surface of your cervix (Pap test).  For women ages 21-65, health care providers may recommend a pelvic exam and a Pap test every three years. For women ages 41-65, they may recommend the Pap test and pelvic exam, combined with testing for human papilloma virus (HPV), every five years. Some types of HPV increase your risk of cervical cancer. Testing for HPV may also be done on women of any age who have unclear Pap test results.  Other health care providers may not recommend any screening for nonpregnant women who are considered low risk for pelvic cancer and have no symptoms. Ask your health care provider if a screening pelvic exam is right for you.  If you have had past treatment for cervical cancer or a condition that could lead to cancer, you need Pap tests and screening for cancer for at least 20 years after your treatment. If Pap tests have been discontinued for you, your risk factors (such as having a new sexual partner) need to be reassessed to determine if you should start having screenings again. Some women have medical problems that  increase the chance of getting cervical cancer. In these cases, your health care provider may recommend that you have screening and Pap tests more often.  If you have a family history of uterine cancer or ovarian cancer, talk with your health care provider about genetic screening.  If you have vaginal bleeding after reaching menopause, tell your health care provider.  There are currently no reliable tests available to screen for ovarian cancer. Lung Cancer  Lung cancer screening is recommended for adults 31-40 years old who are at high risk for lung cancer because of a history of smoking. A yearly low-dose CT scan of the lungs is recommended if you:  Currently smoke.  Have a  history of at least 30 pack-years of smoking and you currently smoke or have quit within the past 15 years. A pack-year is smoking an average of one pack of cigarettes per day for one year. Yearly screening should:  Continue until it has been 15 years since you quit.  Stop if you develop a health problem that would prevent you from having lung cancer treatment. Colorectal Cancer  This type of cancer can be detected and can often be prevented.  Routine colorectal cancer screening usually begins at age 66 and continues through age 92.  If you have risk factors for colon cancer, your health care provider may recommend that you be screened at an earlier age.  If you have a family history of colorectal cancer, talk with your health care provider about genetic screening.  Your health care provider may also recommend using home test kits to check for hidden blood in your stool.  A small camera at the end of a tube can be used to examine your colon directly (sigmoidoscopy or colonoscopy). This is done to check for the earliest forms of colorectal cancer.  Direct examination of the colon should be repeated every 5-10 years until age 72. However, if early forms of precancerous polyps or small growths are found or if you have a family history or genetic risk for colorectal cancer, you may need to be screened more often. Skin Cancer  Check your skin from head to toe regularly.  Monitor any moles. Be sure to tell your health care provider:  About any new moles or changes in moles, especially if there is a change in a mole's shape or color.  If you have a mole that is larger than the size of a pencil eraser.  If any of your family members has a history of skin cancer, especially at a young age, talk with your health care provider about genetic screening.  Always use sunscreen. Apply sunscreen liberally and repeatedly throughout the day.  Whenever you are outside, protect yourself by wearing long  sleeves, pants, a wide-brimmed hat, and sunglasses. What should I know about osteoporosis? Osteoporosis is a condition in which bone destruction happens more quickly than new bone creation. After menopause, you may be at an increased risk for osteoporosis. To help prevent osteoporosis or the bone fractures that can happen because of osteoporosis, the following is recommended:  If you are 52-70 years old, get at least 1,000 mg of calcium and at least 600 mg of vitamin D per day.  If you are older than age 41 but younger than age 71, get at least 1,200 mg of calcium and at least 600 mg of vitamin D per day.  If you are older than age 43, get at least 1,200 mg of calcium and at least 800  mg of vitamin D per day. Smoking and excessive alcohol intake increase the risk of osteoporosis. Eat foods that are rich in calcium and vitamin D, and do weight-bearing exercises several times each week as directed by your health care provider. What should I know about how menopause affects my mental health? Depression may occur at any age, but it is more common as you become older. Common symptoms of depression include:  Low or sad mood.  Changes in sleep patterns.  Changes in appetite or eating patterns.  Feeling an overall lack of motivation or enjoyment of activities that you previously enjoyed.  Frequent crying spells. Talk with your health care provider if you think that you are experiencing depression. What should I know about immunizations? It is important that you get and maintain your immunizations. These include:  Tetanus, diphtheria, and pertussis (Tdap) booster vaccine.  Influenza every year before the flu season begins.  Pneumonia vaccine.  Shingles vaccine. Your health care provider may also recommend other immunizations. This information is not intended to replace advice given to you by your health care provider. Make sure you discuss any questions you have with your health care  provider. Document Released: 05/22/2005 Document Revised: 10/18/2015 Document Reviewed: 01/01/2015  2017 [employer]

## 2016-04-22 LAB — CBC WITH DIFFERENTIAL/PLATELET
BASOS: 1 %
Basophils Absolute: 0 10*3/uL (ref 0.0–0.2)
EOS (ABSOLUTE): 0.2 10*3/uL (ref 0.0–0.4)
EOS: 3 %
HEMATOCRIT: 40.5 % (ref 34.0–46.6)
HEMOGLOBIN: 13.5 g/dL (ref 11.1–15.9)
IMMATURE GRANS (ABS): 0 10*3/uL (ref 0.0–0.1)
IMMATURE GRANULOCYTES: 0 %
LYMPHS: 33 %
Lymphocytes Absolute: 2.1 10*3/uL (ref 0.7–3.1)
MCH: 30 pg (ref 26.6–33.0)
MCHC: 33.3 g/dL (ref 31.5–35.7)
MCV: 90 fL (ref 79–97)
MONOCYTES: 9 %
Monocytes Absolute: 0.5 10*3/uL (ref 0.1–0.9)
NEUTROS PCT: 54 %
Neutrophils Absolute: 3.4 10*3/uL (ref 1.4–7.0)
Platelets: 274 10*3/uL (ref 150–379)
RBC: 4.5 x10E6/uL (ref 3.77–5.28)
RDW: 13.8 % (ref 12.3–15.4)
WBC: 6.2 10*3/uL (ref 3.4–10.8)

## 2016-04-22 LAB — THYROID PANEL WITH TSH
Free Thyroxine Index: 1.5 (ref 1.2–4.9)
T3 Uptake Ratio: 25 % (ref 24–39)
T4, Total: 5.9 ug/dL (ref 4.5–12.0)
TSH: 2.68 u[IU]/mL (ref 0.450–4.500)

## 2016-04-22 LAB — LIPID PANEL
CHOLESTEROL TOTAL: 309 mg/dL — AB (ref 100–199)
Chol/HDL Ratio: 6.2 ratio units — ABNORMAL HIGH (ref 0.0–4.4)
HDL: 50 mg/dL (ref >39)
LDL CALC: 209 mg/dL — AB (ref 0–99)
TRIGLYCERIDES: 250 mg/dL — AB (ref 0–149)
VLDL CHOLESTEROL CAL: 50 mg/dL — AB (ref 5–40)

## 2016-04-22 LAB — CMP14+EGFR
ALBUMIN: 4.2 g/dL (ref 3.5–4.8)
ALT: 14 IU/L (ref 0–32)
AST: 19 IU/L (ref 0–40)
Albumin/Globulin Ratio: 2 (ref 1.2–2.2)
Alkaline Phosphatase: 68 IU/L (ref 39–117)
BUN/Creatinine Ratio: 16 (ref 12–28)
BUN: 11 mg/dL (ref 8–27)
Bilirubin Total: 0.3 mg/dL (ref 0.0–1.2)
CALCIUM: 9.8 mg/dL (ref 8.7–10.3)
CO2: 25 mmol/L (ref 18–29)
CREATININE: 0.69 mg/dL (ref 0.57–1.00)
Chloride: 100 mmol/L (ref 96–106)
GFR calc non Af Amer: 84 mL/min/{1.73_m2} (ref >59)
GFR, EST AFRICAN AMERICAN: 96 mL/min/{1.73_m2} (ref >59)
GLOBULIN, TOTAL: 2.1 g/dL (ref 1.5–4.5)
Glucose: 102 mg/dL — ABNORMAL HIGH (ref 65–99)
Potassium: 5.1 mmol/L (ref 3.5–5.2)
SODIUM: 140 mmol/L (ref 134–144)
TOTAL PROTEIN: 6.3 g/dL (ref 6.0–8.5)

## 2016-04-22 LAB — VITAMIN D 25 HYDROXY (VIT D DEFICIENCY, FRACTURES): Vit D, 25-Hydroxy: 26.1 ng/mL — ABNORMAL LOW (ref 30.0–100.0)

## 2016-04-22 LAB — SPECIMEN STATUS REPORT

## 2016-04-24 ENCOUNTER — Other Ambulatory Visit: Payer: Self-pay | Admitting: Pharmacist

## 2016-04-24 ENCOUNTER — Encounter: Payer: Self-pay | Admitting: Family

## 2016-04-24 ENCOUNTER — Ambulatory Visit (INDEPENDENT_AMBULATORY_CARE_PROVIDER_SITE_OTHER): Payer: Medicare Other | Admitting: Family

## 2016-04-24 ENCOUNTER — Ambulatory Visit (INDEPENDENT_AMBULATORY_CARE_PROVIDER_SITE_OTHER): Payer: Medicare Other

## 2016-04-24 VITALS — BP 133/75 | HR 62 | Temp 98.0°F | Ht 62.0 in | Wt 159.6 lb

## 2016-04-24 DIAGNOSIS — L0291 Cutaneous abscess, unspecified: Secondary | ICD-10-CM

## 2016-04-24 DIAGNOSIS — K5900 Constipation, unspecified: Secondary | ICD-10-CM

## 2016-04-24 DIAGNOSIS — B372 Candidiasis of skin and nail: Secondary | ICD-10-CM | POA: Diagnosis not present

## 2016-04-24 DIAGNOSIS — N952 Postmenopausal atrophic vaginitis: Secondary | ICD-10-CM

## 2016-04-24 DIAGNOSIS — R1031 Right lower quadrant pain: Secondary | ICD-10-CM

## 2016-04-24 LAB — URINALYSIS, COMPLETE
Bilirubin, UA: NEGATIVE
GLUCOSE, UA: NEGATIVE
KETONES UA: NEGATIVE
Leukocytes, UA: NEGATIVE
NITRITE UA: NEGATIVE
Protein, UA: NEGATIVE
SPEC GRAV UA: 1.015 (ref 1.005–1.030)
UUROB: 0.2 mg/dL (ref 0.2–1.0)
pH, UA: 6 (ref 5.0–7.5)

## 2016-04-24 LAB — MICROSCOPIC EXAMINATION: RENAL EPITHEL UA: NONE SEEN /HPF

## 2016-04-24 MED ORDER — ROSUVASTATIN CALCIUM 20 MG PO TABS: 20.0000 mg | ORAL_TABLET | Freq: Every day | ORAL | 2 refills | Status: DC

## 2016-04-24 MED ORDER — POLYETHYLENE GLYCOL 3350 17 GM/SCOOP PO POWD: 0.5000 | Freq: Every day | ORAL | 3 refills | Status: DC

## 2016-04-24 MED ORDER — ESTROGENS, CONJUGATED 0.625 MG/GM VA CREA: 1.0000 | TOPICAL_CREAM | Freq: Every day | VAGINAL | 12 refills | Status: DC

## 2016-04-24 MED ORDER — SULFAMETHOXAZOLE-TRIMETHOPRIM 800-160 MG PO TABS: 1.0000 | ORAL_TABLET | Freq: Two times a day (BID) | ORAL | 0 refills | Status: DC

## 2016-04-24 NOTE — Progress Notes (Addendum)
   Subjective:    Patient ID: [name], female    DOB: 06-01-1935, 81 y.o.   MRN: [id number]  Rash  This is a recurrent problem. The current episode started more than 1 year ago. The problem has been waxing and waning since onset. The affected locations include the groin and genitalia. The rash is characterized by redness. Pertinent negatives include no congestion, cough, fever, nail changes, shortness of breath or sore throat. Past treatments include anti-itch cream and topical steroids. The treatment provided mild relief.  Abdominal Pain  This is a new problem. The current episode started in the past 7 days. The onset quality is gradual. The problem occurs constantly. The problem has been gradually worsening. The pain is located in the RLQ. The pain is moderate. The quality of the pain is aching. Associated symptoms include constipation. Pertinent negatives include no fever.  Constipation  This is a recurrent problem. The current episode started more than 1 month ago. The problem is unchanged. Associated symptoms include abdominal pain. Pertinent negatives include no fever. She has tried laxatives for the symptoms. The treatment provided no relief.   *PT states she had her gallbladder and appendix removed.     Review of Systems  Constitutional: Negative for fever.  HENT: Negative for congestion and sore throat.   Respiratory: Negative for cough and shortness of breath.   Gastrointestinal: Positive for abdominal pain and constipation.  Skin: Positive for rash. Negative for nail changes.  All other systems reviewed and are negative.      Objective:   Physical Exam  Constitutional: She is oriented to person, place, and time. She appears well-developed and well-nourished.  HENT:  Head: Normocephalic.  Cardiovascular: Normal rate, regular rhythm, normal heart sounds and intact distal pulses.   Pulmonary/Chest: Effort normal and breath sounds normal.  Abdominal: There is tenderness (RLQ  pain).  Musculoskeletal: Normal range of motion.  Neurological: She is alert and oriented to person, place, and time.  Skin: Rash noted.  Erythemas yeast rash in groin, small abscess that is hard on left labia     BP 133/75   Pulse 62   Temp 98 F (36.7 C) (Oral)   Ht 5\' 2"  (1.575 m)   Wt 159 lb 9.6 oz (72.4 kg)   BMI 29.19 kg/m        Assessment & Plan:  1. RLQ abdominal pain - Urinalysis, Complete - CBC with Differential/Platelet - DG Abd 1 View; Future  2. Candidiasis of skin -Keep clean and dry -Continue OTC cream as needed  3. Abscess -Keep clean and dry -Warm compresses RTO prn  - sulfamethoxazole-trimethoprim (BACTRIM DS) 800-160 MG tablet; Take 1 tablet by mouth 2 (two) times daily.  Dispense: 14 tablet; Refill: 0  4. Constipation, unspecified constipation type -Force fluids   - DG Abd 1 View; Future  Evelina Dun, FNP

## 2016-04-24 NOTE — Patient Instructions (Signed)

## 2016-04-24 NOTE — Addendum Note (Signed)
Addended by: Evelina Dun A on: 04/24/2016 01:25 PM   Modules accepted: Orders

## 2016-04-25 LAB — CBC WITH DIFFERENTIAL/PLATELET
BASOS ABS: 0 10*3/uL (ref 0.0–0.2)
Basos: 0 %
EOS (ABSOLUTE): 0.2 10*3/uL (ref 0.0–0.4)
Eos: 4 %
HEMOGLOBIN: 14 g/dL (ref 11.1–15.9)
Hematocrit: 40.7 % (ref 34.0–46.6)
Immature Grans (Abs): 0 10*3/uL (ref 0.0–0.1)
Immature Granulocytes: 0 %
LYMPHS ABS: 2.4 10*3/uL (ref 0.7–3.1)
Lymphs: 35 %
MCH: 30.8 pg (ref 26.6–33.0)
MCHC: 34.4 g/dL (ref 31.5–35.7)
MCV: 90 fL (ref 79–97)
Monocytes Absolute: 0.6 10*3/uL (ref 0.1–0.9)
Monocytes: 9 %
NEUTROS ABS: 3.5 10*3/uL (ref 1.4–7.0)
Neutrophils: 52 %
PLATELETS: 264 10*3/uL (ref 150–379)
RBC: 4.54 x10E6/uL (ref 3.77–5.28)
RDW: 14 % (ref 12.3–15.4)
WBC: 6.7 10*3/uL (ref 3.4–10.8)

## 2016-04-27 ENCOUNTER — Other Ambulatory Visit: Payer: Self-pay | Admitting: Family

## 2016-04-27 DIAGNOSIS — R319 Hematuria, unspecified: Secondary | ICD-10-CM

## 2016-05-06 ENCOUNTER — Telehealth: Payer: Self-pay | Admitting: Family

## 2016-05-07 ENCOUNTER — Other Ambulatory Visit (INDEPENDENT_AMBULATORY_CARE_PROVIDER_SITE_OTHER): Payer: Medicare Other

## 2016-05-07 ENCOUNTER — Other Ambulatory Visit: Payer: Self-pay | Admitting: Orthopedic Surgery

## 2016-05-07 DIAGNOSIS — M1711 Unilateral primary osteoarthritis, right knee: Secondary | ICD-10-CM | POA: Diagnosis not present

## 2016-05-07 DIAGNOSIS — G8929 Other chronic pain: Secondary | ICD-10-CM | POA: Diagnosis not present

## 2016-05-07 DIAGNOSIS — M25561 Pain in right knee: Secondary | ICD-10-CM | POA: Diagnosis not present

## 2016-05-07 DIAGNOSIS — R52 Pain, unspecified: Secondary | ICD-10-CM

## 2016-05-21 DIAGNOSIS — H35373 Puckering of macula, bilateral: Secondary | ICD-10-CM | POA: Diagnosis not present

## 2016-05-21 DIAGNOSIS — H43813 Vitreous degeneration, bilateral: Secondary | ICD-10-CM | POA: Diagnosis not present

## 2016-06-02 ENCOUNTER — Ambulatory Visit (INDEPENDENT_AMBULATORY_CARE_PROVIDER_SITE_OTHER): Payer: Medicare Other | Admitting: Internal Medicine

## 2016-06-02 ENCOUNTER — Encounter: Payer: Self-pay | Admitting: Internal Medicine

## 2016-06-02 ENCOUNTER — Encounter (INDEPENDENT_AMBULATORY_CARE_PROVIDER_SITE_OTHER): Payer: Self-pay

## 2016-06-02 VITALS — BP 144/70 | HR 60 | Ht 61.0 in | Wt 160.5 lb

## 2016-06-02 DIAGNOSIS — K5909 Other constipation: Secondary | ICD-10-CM | POA: Diagnosis not present

## 2016-06-02 NOTE — Progress Notes (Signed)
   [name] 81 y.o. [birth year] [id number]  Assessment & Plan:   1. Chronic constipation    Ten-year daily MiraLAX increase fiber in diet okay to use an occasional laxative. She is reassured today. Don't think we need to do any investigation, colonoscopy 2011 with diverticulosis melanosis a 2 mm adenoma and internal hemorrhoids and she had banding of internal hemorrhoids February 2015 at flexible sigmoidoscopy also.   Subjective:   Chief Complaint: Constipation  HPI The patient is here because she has been constipated lately. Stools are variable size sometimes smaller sometimes normal. She started on MiraLAX at the suggestion of Sherre Scarlet NP and since then things a been well, other than needing an occasional herbal laxative. She just wasn't enough there is anything wrong. No bleeding from her hemorrhoids since the banding in 2015. He says she could probably eat more fiber in her diet. Medications, allergies, past medical history, past surgical history, family history and social history are reviewed and updated in the EMR.  Review of Systems As above  Objective:   Physical Exam BP (!) 144/70   Pulse 60   Ht 5\' 1"  (1.549 m)   Wt 160 lb 8 oz (72.8 kg)   BMI 30.33 kg/m  Pleasant elderly woman in no acute distress

## 2016-06-02 NOTE — Patient Instructions (Signed)
If you are age 79 or older, your body mass index should be between 23-30. Your Body mass index is 30.33 kg/m. If this is out of the aforementioned range listed, please consider follow up with your Primary Care Provider.  If you are age 37 or younger, your body mass index should be between 19-25. Your Body mass index is 30.33 kg/m. If this is out of the aformentioned range listed, please consider follow up with your Primary Care Provider.   Today we are giving you a high fiber diet.  Use Miralax as needed.  Thank you for choosing me and [facility].  Silvano Rusk, MD

## 2016-06-04 DIAGNOSIS — H26492 Other secondary cataract, left eye: Secondary | ICD-10-CM | POA: Diagnosis not present

## 2016-06-04 DIAGNOSIS — Z961 Presence of intraocular lens: Secondary | ICD-10-CM | POA: Diagnosis not present

## 2016-06-04 DIAGNOSIS — H524 Presbyopia: Secondary | ICD-10-CM | POA: Diagnosis not present

## 2016-06-08 DIAGNOSIS — M1711 Unilateral primary osteoarthritis, right knee: Secondary | ICD-10-CM | POA: Diagnosis not present

## 2016-06-10 DIAGNOSIS — H26491 Other secondary cataract, right eye: Secondary | ICD-10-CM | POA: Diagnosis not present

## 2016-06-10 DIAGNOSIS — H26492 Other secondary cataract, left eye: Secondary | ICD-10-CM | POA: Diagnosis not present

## 2016-06-15 DIAGNOSIS — M1711 Unilateral primary osteoarthritis, right knee: Secondary | ICD-10-CM | POA: Diagnosis not present

## 2016-06-24 ENCOUNTER — Other Ambulatory Visit: Payer: Self-pay

## 2016-06-24 MED ORDER — ROSUVASTATIN CALCIUM 20 MG PO TABS: 20.0000 mg | ORAL_TABLET | Freq: Every day | ORAL | 0 refills | Status: DC

## 2016-06-26 DIAGNOSIS — M1711 Unilateral primary osteoarthritis, right knee: Secondary | ICD-10-CM | POA: Diagnosis not present

## 2016-07-20 ENCOUNTER — Encounter: Payer: Self-pay | Admitting: Family

## 2016-07-30 NOTE — Progress Notes (Signed)
Patient ID: [name], female   DOB: January 17, 1936, 81 y.o.   MRN: [id number] [name] is seen today in followup for chest pain aortic stenosis and hypercholesterolemia. His been doing well. She has arthritis and fibromyalgia.She had an essentially normal heart cath in 2008 and a normal Myoview 2009. Her EKGs have been normal. She has moderate  aortic stenosis.  Her dentition is in good shape. He is not any palpitations PND orthopnea his been no syncope lower extremity edema or fever of unknown origin.   Echo 05/23/15  Moderate AS mean gradient 26 mmHg peak 43 mmHg with normal eF  She stopped her cholesterol pill due to muscle pain Chol very high and Seen in lipid clinic back on crestor now   Has had blurry vision Sees two eye doctors carotids are fine need BS check may be high  ROS: Denies fever, malais, weight loss, blurry vision, decreased visual acuity, cough, sputum, SOB, hemoptysis, pleuritic pain, palpitaitons, heartburn, abdominal pain, melena, lower extremity edema, claudication, or rash.  All other systems reviewed and negative  General: Affect appropriate Healthy:  appears stated age 81: normal Neck supple with no adenopathy JVP normal no bruits no thyromegaly Lungs clear with no wheezing and good diaphragmatic motion Heart:  S1/S2  Preserved AS  murmur, no rub, gallop or click PMI normal Abdomen: benighn, BS positve, no tenderness, no AAA no bruit.  No HSM or HJR Distal pulses intact with no bruits No edema Neuro non-focal Skin warm and dry No muscular weakness   Current Outpatient Prescriptions  Medication Sig Dispense Refill  . Aloe Vera 25 MG CAPS Take 1 capsule by mouth daily.    Marland Kitchen ascorbic acid (VITAMIN C) 1000 MG tablet Take 1,000 mg by mouth daily.    Marland Kitchen aspirin 81 MG tablet Take 81 mg by mouth daily.     Marland Kitchen BEE POLLEN PO Take 400 mg by mouth daily.    . clobetasol cream (TEMOVATE) 2.83 % Apply 1 application topically 2 (two) times daily as needed.    . Coenzyme Q10 (CO  Q 10) 100 MG CAPS Take 1 capsule by mouth daily.    Marland Kitchen conjugated estrogens (PREMARIN) vaginal cream Place 1 Applicatorful vaginally daily. 42.5 g 12  . metoprolol (LOPRESSOR) 50 MG tablet TAKE ONE TABLET BY MOUTH TWICE DAILY 60 tablet 0  . Misc Natural Products (TUMERSAID) TABS Take 75 mg by mouth daily.    . polyethylene glycol powder (GLYCOLAX/MIRALAX) powder Take 127.5 g by mouth daily. 765 g 3  . rosuvastatin (CRESTOR) 20 MG tablet Take 1 tablet (20 mg total) by mouth daily. 90 tablet 0  . Sennosides-Docusate Sodium 8.6-50 MG CAPS Take 1 capsule by mouth as needed.    . sulfamethoxazole-trimethoprim (BACTRIM DS) 800-160 MG tablet Take 1 tablet by mouth 2 (two) times daily. 14 tablet 0   No current facility-administered medications for this visit.     Allergies  Tape  Electrocardiogram: 2.17  SR normal ECG  05/21/14  SR rate 59  Normal  07/31/16  NSR rate 53 normal   Assessment and Plan Chest Pain: resolved normal cath 2008 and normal myovue 2009 AS:  Moderate no change in murmur f/u echo  Ordered  Fibromyalgia:  Uses ultram for pain HTN:  Well controlled.  Continue current medications and low sodium Dash type diet.   Chol:  Back on crestor f/u lipid clinic labs ordered  Lab Results  Component Value Date   LDLCALC 209 (H) 04/21/2016  Vision: blurry carotids  fine f/u ophthalmology check A1c    Jenkins Rouge

## 2016-07-31 ENCOUNTER — Encounter (INDEPENDENT_AMBULATORY_CARE_PROVIDER_SITE_OTHER): Payer: Self-pay

## 2016-07-31 ENCOUNTER — Encounter: Payer: Self-pay | Admitting: Cardiovascular Disease

## 2016-07-31 ENCOUNTER — Ambulatory Visit (INDEPENDENT_AMBULATORY_CARE_PROVIDER_SITE_OTHER): Payer: Medicare Other | Admitting: Cardiovascular Disease

## 2016-07-31 VITALS — BP 132/78 | HR 53 | Ht 62.0 in | Wt 161.0 lb

## 2016-07-31 DIAGNOSIS — R739 Hyperglycemia, unspecified: Secondary | ICD-10-CM | POA: Diagnosis not present

## 2016-07-31 DIAGNOSIS — I350 Nonrheumatic aortic (valve) stenosis: Secondary | ICD-10-CM | POA: Diagnosis not present

## 2016-07-31 NOTE — Patient Instructions (Signed)
Schedule Echo next week   Schedule fasting lab ( lipid,hepatic panels,a1c ) same day as echo    Your physician wants you to follow-up in: 6 months with Dr.Nishan. You will receive a reminder letter in the mail two months in advance. If you don't receive a letter, please call our office to schedule the follow-up appointment.

## 2016-08-06 DIAGNOSIS — M25512 Pain in left shoulder: Secondary | ICD-10-CM | POA: Diagnosis not present

## 2016-08-06 DIAGNOSIS — M1711 Unilateral primary osteoarthritis, right knee: Secondary | ICD-10-CM | POA: Diagnosis not present

## 2016-08-10 ENCOUNTER — Encounter: Payer: Self-pay | Admitting: *Deleted

## 2016-08-10 DIAGNOSIS — Z1231 Encounter for screening mammogram for malignant neoplasm of breast: Secondary | ICD-10-CM | POA: Diagnosis not present

## 2016-08-12 ENCOUNTER — Other Ambulatory Visit: Payer: Medicare Other | Admitting: *Deleted

## 2016-08-12 ENCOUNTER — Telehealth: Payer: Self-pay

## 2016-08-12 ENCOUNTER — Ambulatory Visit (HOSPITAL_COMMUNITY): Payer: Medicare Other | Attending: Cardiology

## 2016-08-12 ENCOUNTER — Other Ambulatory Visit: Payer: Self-pay

## 2016-08-12 DIAGNOSIS — I350 Nonrheumatic aortic (valve) stenosis: Secondary | ICD-10-CM

## 2016-08-12 DIAGNOSIS — I700 Atherosclerosis of aorta: Secondary | ICD-10-CM | POA: Diagnosis not present

## 2016-08-12 DIAGNOSIS — R739 Hyperglycemia, unspecified: Secondary | ICD-10-CM

## 2016-08-12 NOTE — Telephone Encounter (Signed)
-----   Message from [name], MD sent at 08/12/2016  2:14 PM EDT ----- AS is worse but not quite at surgical level f/u echo in 6 months

## 2016-08-12 NOTE — Telephone Encounter (Signed)
Patient aware of echo results. Per Dr. Johnsie Cancel, AS is worse but not quite at surgical level f/u echo in 6 months. Patient will come in on January 12, 2017 for repeat test. Patient verbalized understanding.

## 2016-08-13 ENCOUNTER — Telehealth: Payer: Self-pay

## 2016-08-13 DIAGNOSIS — Z79899 Other long term (current) drug therapy: Secondary | ICD-10-CM

## 2016-08-13 LAB — HEPATIC FUNCTION PANEL
ALBUMIN: 4.2 g/dL (ref 3.5–4.8)
ALT: 17 IU/L (ref 0–32)
AST: 14 IU/L (ref 0–40)
Alkaline Phosphatase: 67 IU/L (ref 39–117)
Bilirubin Total: 0.2 mg/dL (ref 0.0–1.2)
Bilirubin, Direct: 0.11 mg/dL (ref 0.00–0.40)
TOTAL PROTEIN: 6.4 g/dL (ref 6.0–8.5)

## 2016-08-13 LAB — LIPID PANEL
CHOL/HDL RATIO: 2.7 ratio (ref 0.0–4.4)
Cholesterol, Total: 133 mg/dL (ref 100–199)
HDL: 50 mg/dL (ref >39)
LDL Calculated: 58 mg/dL (ref 0–99)
Triglycerides: 123 mg/dL (ref 0–149)
VLDL CHOLESTEROL CAL: 25 mg/dL (ref 5–40)

## 2016-08-13 LAB — HEMOGLOBIN A1C
ESTIMATED AVERAGE GLUCOSE: 120 mg/dL
HEMOGLOBIN A1C: 5.8 % — AB (ref 4.8–5.6)

## 2016-08-13 NOTE — Telephone Encounter (Signed)
Patient aware of her lab results. Patient will repeat lab work February 16, 2017

## 2016-08-13 NOTE — Telephone Encounter (Signed)
-----   Message from [name], MD sent at 08/12/2016  4:36 PM EDT ----- Cholesterol is at goal and LFT's are normal F/U labs in 6 months

## 2016-09-23 ENCOUNTER — Other Ambulatory Visit: Payer: Self-pay | Admitting: *Deleted

## 2016-09-23 MED ORDER — METOPROLOL TARTRATE 50 MG PO TABS: 50.0000 mg | ORAL_TABLET | Freq: Two times a day (BID) | ORAL | 0 refills | Status: DC

## 2016-10-11 ENCOUNTER — Other Ambulatory Visit: Payer: Self-pay | Admitting: Family

## 2016-10-21 ENCOUNTER — Other Ambulatory Visit: Payer: Self-pay | Admitting: Family

## 2016-11-18 ENCOUNTER — Other Ambulatory Visit: Payer: Self-pay | Admitting: Family

## 2016-11-18 NOTE — Telephone Encounter (Signed)
Last seebn 04/24/16  [name]

## 2016-11-30 ENCOUNTER — Other Ambulatory Visit: Payer: Self-pay | Admitting: Family

## 2016-11-30 ENCOUNTER — Ambulatory Visit: Payer: Medicare Other

## 2016-11-30 DIAGNOSIS — Z780 Asymptomatic menopausal state: Secondary | ICD-10-CM

## 2016-12-10 ENCOUNTER — Ambulatory Visit (INDEPENDENT_AMBULATORY_CARE_PROVIDER_SITE_OTHER): Payer: Medicare Other

## 2016-12-10 DIAGNOSIS — Z780 Asymptomatic menopausal state: Secondary | ICD-10-CM | POA: Diagnosis not present

## 2016-12-11 ENCOUNTER — Ambulatory Visit (INDEPENDENT_AMBULATORY_CARE_PROVIDER_SITE_OTHER): Payer: Medicare Other | Admitting: Family Medicine

## 2016-12-11 ENCOUNTER — Encounter: Payer: Self-pay | Admitting: Family Medicine

## 2016-12-11 VITALS — BP 132/61 | HR 59 | Temp 97.3°F | Ht 62.0 in | Wt 160.0 lb

## 2016-12-11 DIAGNOSIS — J0100 Acute maxillary sinusitis, unspecified: Secondary | ICD-10-CM | POA: Diagnosis not present

## 2016-12-11 DIAGNOSIS — J301 Allergic rhinitis due to pollen: Secondary | ICD-10-CM

## 2016-12-11 MED ORDER — AMOXICILLIN-POT CLAVULANATE 875-125 MG PO TABS: 1.0000 | ORAL_TABLET | Freq: Two times a day (BID) | ORAL | 0 refills | Status: DC

## 2016-12-11 MED ORDER — BETAMETHASONE SOD PHOS & ACET 6 (3-3) MG/ML IJ SUSP
6.0000 mg | Freq: Once | INTRAMUSCULAR | Status: AC
  Administered 2016-12-11: 6 mg via INTRAMUSCULAR

## 2016-12-11 NOTE — Progress Notes (Signed)
Chief Complaint  Patient presents with  . Cough    pt here today c/o cough and congestion especially at night which keeps her awake    HPI  Patient presents today for Patient has allergic rhinitis symptoms including sneezing frequently sniffling, clear rhinorrhea, watery and itchy eyes. There has been no fever no chills no sweats. No earaches. There is some scratchy throat but no sore throat or difficulty swallowing. There is Moderate nasal congestion.There is a great deal of phlegm. It's draining posteriorly. Sometimes it comes up. When it does it is yellow purulent matter is increasing however the problem has been ongoing for 4 months.  PMH: Smoking status noted ROS: Per HPI  Objective: BP 132/61   Pulse (!) 59   Temp (!) 97.3 F (36.3 C) (Oral)   Ht 5\' 2"  (1.575 m)   Wt 160 lb (72.6 kg)   BMI 29.26 kg/m  Gen: NAD, alert, cooperative with exam HEENT: NCAT, EOMI, PERRLNasal passages swollen and erythematous with turbinates touching midline. CV: RRR, good S1/S2, no murmur Resp: CTABL, no wheezes, non-labored Abd: SNTND, BS present, no guarding or organomegaly Ext: No edema, warm Neuro: Alert and oriented, No gross deficits  Assessment and plan:  1. Seasonal allergic rhinitis due to pollen   2. Acute maxillary sinusitis, recurrence not specified     Meds ordered this encounter  Medications  . betamethasone acetate-betamethasone sodium phosphate (CELESTONE) injection 6 mg  . amoxicillin-clavulanate (AUGMENTIN) 875-125 MG tablet    Sig: Take 1 tablet by mouth 2 (two) times daily.    Dispense:  20 tablet    Refill:  0    No orders of the defined types were placed in this encounter.   Follow up as needed.  [name], MD

## 2016-12-17 ENCOUNTER — Encounter: Payer: Self-pay | Admitting: Family Medicine

## 2016-12-17 ENCOUNTER — Ambulatory Visit (INDEPENDENT_AMBULATORY_CARE_PROVIDER_SITE_OTHER): Payer: Medicare Other | Admitting: Family Medicine

## 2016-12-17 VITALS — BP 131/60 | HR 68 | Temp 99.0°F | Ht 62.0 in | Wt 158.0 lb

## 2016-12-17 DIAGNOSIS — M810 Age-related osteoporosis without current pathological fracture: Secondary | ICD-10-CM | POA: Diagnosis not present

## 2016-12-17 DIAGNOSIS — I10 Essential (primary) hypertension: Secondary | ICD-10-CM | POA: Diagnosis not present

## 2016-12-17 MED ORDER — DENOSUMAB 60 MG/ML ~~LOC~~ SOLN: 60.0000 mg | SUBCUTANEOUS | 0 refills | Status: DC

## 2016-12-17 NOTE — Progress Notes (Signed)
Chief Complaint  Patient presents with  . Results    pt here today to discuss DEXA and treatment options    HPI  Patient presents today for Concerns about pain in her bones. She wonders if it's coming from her osteoporosis. She says she has fallen on occasion. Denies any broken bones but she has had some bruises.Bone density test reviewed.This shows significant osteoporosis at the wrist. Slight progression of osteopenia at the hip.  Treatment options discussed as well. Pt. Prefers prolia if she can afford. Discussed also use of alendronate, actonel and evista.   PMH: Smoking status noted ROS: Per HPI  Objective: BP 131/60   Pulse 68   Temp 99 F (37.2 C) (Oral)   Ht 5\' 2"  (1.575 m)   Wt 158 lb (71.7 kg)   BMI 28.90 kg/m  Gen: NAD, alert, cooperative with exam HEENT: NCAT, EOMI, PERRL Ext: No edema, warm Neuro: Alert and oriented, No gross deficits  Assessment and plan:  1. Age-related osteoporosis without current pathological fracture   2. Essential hypertension     Meds ordered this encounter  Medications  . denosumab (PROLIA) 60 MG/ML SOLN injection    Sig: Inject 60 mg into the skin every 6 (six) months. Administer in upper arm, thigh, or abdomen    Dispense:  1 mL    Refill:  0   20 minutes was spent with this patient. More than one half in discussion of her test results and the implications for fracture risk. Multiple treatment options were discussed as well.   Follow up 3 mos  [name], MD

## 2016-12-17 NOTE — Patient Instructions (Addendum)
Osteoporosis Osteoporosis is the thinning and loss of density in the bones. Osteoporosis makes the bones more brittle, fragile, and likely to break (fracture). Over time, osteoporosis can cause the bones to become so weak that they fracture after a simple fall. The bones most likely to fracture are the bones in the hip, wrist, and spine. What are the causes? The exact cause is not known. What increases the risk? Anyone can develop osteoporosis. You may be at greater risk if you have a family history of the condition or have poor nutrition. You may also have a higher risk if you are:  Female.  56 years old or older.  A smoker.  Not physically active.  White or Asian.  Slender.  What are the signs or symptoms? A fracture might be the first sign of the disease, especially if it results from a fall or injury that would not usually cause a bone to break. Other signs and symptoms include:  Low back and neck pain.  Stooped posture.  Height loss.  How is this diagnosed? To make a diagnosis, your health care provider may:  Take a medical history.  Perform a physical exam.  Order tests, such as: ? A bone mineral density test. ? A dual-energy X-ray absorptiometry test.  How is this treated? The goal of osteoporosis treatment is to strengthen your bones to reduce your risk of a fracture. Treatment may involve:  Making lifestyle changes, such as: ? Eating a diet rich in calcium. ? Doing weight-bearing and muscle-strengthening exercises. ? Stopping tobacco use. ? Limiting alcohol intake.  Taking medicine to slow the process of bone loss or to increase bone density.  Monitoring your levels of calcium and vitamin D.  Follow these instructions at home:  Include calcium and vitamin D in your diet. Calcium is important for bone health, and vitamin D helps the body absorb calcium.  Perform weight-bearing and muscle-strengthening exercises as directed by your health care  provider.  Do not use any tobacco products, including cigarettes, chewing tobacco, and electronic cigarettes. If you need help quitting, ask your health care provider.  Limit your alcohol intake.  Take medicines only as directed by your health care provider.  Keep all follow-up visits as directed by your health care provider. This is important.  Take precautions at home to lower your risk of falling, such as: ? Keeping rooms well lit and clutter free. ? Installing safety rails on stairs. ? Using rubber mats in the bathroom and other areas that are often wet or slippery. Get help right away if: You fall or injure yourself. This information is not intended to replace advice given to you by your health care provider. Make sure you discuss any questions you have with your health care provider. Document Released: 01/07/2005 Document Revised: 09/02/2015 Document Reviewed: 09/07/2013 Elsevier Interactive Patient Education  2017 [place].   Calcium 600mg  please take twice a day.  Vitamin D 2000 IU, please take 1 everyday.  We will start working on your Prolia.

## 2016-12-18 ENCOUNTER — Encounter: Payer: Self-pay | Admitting: *Deleted

## 2016-12-20 ENCOUNTER — Other Ambulatory Visit: Payer: Self-pay | Admitting: Family

## 2016-12-22 ENCOUNTER — Ambulatory Visit: Payer: Medicare Other | Admitting: Family

## 2016-12-24 ENCOUNTER — Other Ambulatory Visit: Payer: Self-pay | Admitting: Family

## 2017-01-07 ENCOUNTER — Other Ambulatory Visit: Payer: Self-pay | Admitting: Family

## 2017-01-08 ENCOUNTER — Telehealth: Payer: Self-pay | Admitting: Family Medicine

## 2017-01-08 NOTE — Telephone Encounter (Signed)
Appt made for Prolia Injection

## 2017-01-12 ENCOUNTER — Ambulatory Visit (INDEPENDENT_AMBULATORY_CARE_PROVIDER_SITE_OTHER): Payer: Medicare Other | Admitting: *Deleted

## 2017-01-12 ENCOUNTER — Other Ambulatory Visit (HOSPITAL_COMMUNITY): Payer: Medicare Other

## 2017-01-12 DIAGNOSIS — M810 Age-related osteoporosis without current pathological fracture: Secondary | ICD-10-CM

## 2017-01-12 MED ORDER — DENOSUMAB 60 MG/ML ~~LOC~~ SOLN
60.0000 mg | Freq: Once | SUBCUTANEOUS | Status: AC
  Administered 2017-01-12: 60 mg via SUBCUTANEOUS

## 2017-01-12 MED ORDER — DENOSUMAB 60 MG/ML ~~LOC~~ SOLN: 60.0000 mg | Freq: Once | SUBCUTANEOUS | Status: DC

## 2017-01-12 NOTE — Progress Notes (Signed)
Pt given Prolia inj L arm Pt tolerated well

## 2017-01-17 ENCOUNTER — Other Ambulatory Visit: Payer: Self-pay | Admitting: Family

## 2017-01-19 DIAGNOSIS — M4722 Other spondylosis with radiculopathy, cervical region: Secondary | ICD-10-CM | POA: Diagnosis not present

## 2017-02-16 ENCOUNTER — Other Ambulatory Visit: Payer: Medicare Other | Admitting: *Deleted

## 2017-02-16 ENCOUNTER — Ambulatory Visit (HOSPITAL_COMMUNITY): Payer: Medicare Other | Attending: Cardiovascular Disease

## 2017-02-16 ENCOUNTER — Telehealth: Payer: Self-pay

## 2017-02-16 ENCOUNTER — Other Ambulatory Visit: Payer: Self-pay

## 2017-02-16 DIAGNOSIS — I350 Nonrheumatic aortic (valve) stenosis: Secondary | ICD-10-CM

## 2017-02-16 DIAGNOSIS — Z79899 Other long term (current) drug therapy: Secondary | ICD-10-CM

## 2017-02-16 DIAGNOSIS — E785 Hyperlipidemia, unspecified: Secondary | ICD-10-CM | POA: Insufficient documentation

## 2017-02-16 DIAGNOSIS — I119 Hypertensive heart disease without heart failure: Secondary | ICD-10-CM | POA: Insufficient documentation

## 2017-02-16 DIAGNOSIS — I080 Rheumatic disorders of both mitral and aortic valves: Secondary | ICD-10-CM | POA: Insufficient documentation

## 2017-02-16 LAB — HEPATIC FUNCTION PANEL
ALBUMIN: 4.1 g/dL (ref 3.5–4.8)
ALK PHOS: 67 IU/L (ref 39–117)
ALT: 13 IU/L (ref 0–32)
AST: 16 IU/L (ref 0–40)
BILIRUBIN, DIRECT: 0.09 mg/dL (ref 0.00–0.40)
Bilirubin Total: 0.2 mg/dL (ref 0.0–1.2)
TOTAL PROTEIN: 6.8 g/dL (ref 6.0–8.5)

## 2017-02-16 LAB — LIPID PANEL
CHOLESTEROL TOTAL: 247 mg/dL — AB (ref 100–199)
Chol/HDL Ratio: 5 ratio — ABNORMAL HIGH (ref 0.0–4.4)
HDL: 49 mg/dL (ref >39)
LDL Calculated: 161 mg/dL — ABNORMAL HIGH (ref 0–99)
Triglycerides: 185 mg/dL — ABNORMAL HIGH (ref 0–149)
VLDL CHOLESTEROL CAL: 37 mg/dL (ref 5–40)

## 2017-02-16 MED ORDER — ROSUVASTATIN CALCIUM 20 MG PO TABS: 20.0000 mg | ORAL_TABLET | Freq: Every day | ORAL | 3 refills | Status: DC

## 2017-02-16 NOTE — Telephone Encounter (Signed)
-----   Message from [name], MD sent at 02/16/2017  3:57 PM EST ----- Cholesterol still very high Is she taking crestor ??  F/u lipid clinic ? Add zetia or increase dose crestor if compliant

## 2017-02-16 NOTE — Telephone Encounter (Signed)
Patient aware of lab results. Per Dr. Johnsie Cancel, cholesterol still very high. Patient has not been taking her Crestor. Patient needs a refill on her Crestor, will send to patient pharmacy of choice. Will also send message to [facility] to schedule patient for lipid clinic. Patient verbalized understanding.

## 2017-02-17 ENCOUNTER — Telehealth: Payer: Self-pay

## 2017-02-17 DIAGNOSIS — I350 Nonrheumatic aortic (valve) stenosis: Secondary | ICD-10-CM

## 2017-02-17 NOTE — Telephone Encounter (Signed)
Patient aware of results. Echo order in for patient to have done in 6 months.

## 2017-02-17 NOTE — Telephone Encounter (Signed)
-----   Message from [name], MD sent at 02/17/2017 11:48 AM EST ----- AS still moderate but a little worse f/u echo in 6 months

## 2017-03-08 NOTE — Progress Notes (Signed)
Patient ID: [name], female   DOB: 08/29/1935, 81 y.o.   MRN: [id number] [place] is seen today in followup for chest pain aortic stenosis and hypercholesterolemia. She has arthritis and fibromyalgia.She had an essentially normal heart cath in 2008 and a normal Myoview 2009. Her EKGs have been normal. She has moderate  aortic stenosis.  Her dentition is in good shape. He is not any palpitations PND orthopnea his been no syncope lower extremity edema    Echo 05/23/15  Moderate AS mean gradient 26 mmHg peak 43 mmHg with normal eF Echo 02/16/17 Moderate to Severe AS mean gradient 32 mmHg peak 58 mmHg  She stopped her cholesterol pill due to muscle pain Chol very high and Seen in lipid clinic back on crestor now   Has had blurry vision Sees two eye doctors carotids are fin A1c normal  Some indigestion not exertional worse with onions she had on burger   ROS: Denies fever, malais, weight loss, blurry vision, decreased visual acuity, cough, sputum, SOB, hemoptysis, pleuritic pain, palpitaitons, heartburn, abdominal pain, melena, lower extremity edema, claudication, or rash.  All other systems reviewed and negative  General: BP 128/72   Pulse 64   Ht 5\' 2"  (1.575 m)   Wt 160 lb 8 oz (72.8 kg)   SpO2 97%   BMI 29.36 kg/m  Affect appropriate Healthy:  appears stated age 81: normal Neck supple with no adenopathy JVP normal no bruits no thyromegaly Lungs clear with no wheezing and good diaphragmatic motion Heart:  S1/S still preserved AS  murmur, no rub, gallop or click PMI normal Abdomen: benighn, BS positve, no tenderness, no AAA no bruit.  No HSM or HJR Distal pulses intact with no bruits No edema Neuro non-focal Skin warm and dry No muscular weakness    Current Outpatient Medications  Medication Sig Dispense Refill  . Aloe Vera 25 MG CAPS Take 1 capsule by mouth daily.    Marland Kitchen ascorbic acid (VITAMIN C) 1000 MG tablet Take 1,000 mg by mouth daily.    Marland Kitchen aspirin 81 MG tablet Take 81 mg by  mouth daily.     Marland Kitchen BEE POLLEN PO Take 400 mg by mouth daily.    . clobetasol cream (TEMOVATE) 2.68 % Apply 1 application topically 2 (two) times daily as needed.    . Coenzyme Q10 (CO Q 10) 100 MG CAPS Take 1 capsule by mouth daily.    Marland Kitchen conjugated estrogens (PREMARIN) vaginal cream Place 1 Applicatorful vaginally daily. 42.5 g 12  . denosumab (PROLIA) 60 MG/ML SOLN injection Inject 60 mg into the skin every 6 (six) months. Administer in upper arm, thigh, or abdomen 1 mL 0  . meloxicam (MOBIC) 15 MG tablet Take 15 mg by mouth daily.    . metoprolol tartrate (LOPRESSOR) 50 MG tablet TAKE 1 TABLET BY MOUTH TWICE A DAY 180 tablet 1  . Misc Natural Products (TUMERSAID) TABS Take 75 mg by mouth daily.    . polyethylene glycol powder (GLYCOLAX/MIRALAX) powder Take 127.5 g by mouth daily. 765 g 3  . rosuvastatin (CRESTOR) 20 MG tablet Take 1 tablet (20 mg total) daily by mouth. 90 tablet 3  . Sennosides-Docusate Sodium 8.6-50 MG CAPS Take 1 capsule by mouth as needed.     No current facility-administered medications for this visit.     Allergies  Tape  Electrocardiogram: 2.17  SR normal ECG  05/21/14  SR rate 59  Normal  07/31/16  NSR rate 53 normal   Assessment and  Plan  Chest Pain: resolved normal cath 2008 and normal myovue 2009 recent symptoms Sound like indigestion trial of zantac and antacids she will call if this doesn't help AS:  Moderate to severe mean gradient 32 mmHg 02/16/17 f/u echo May 2019  Fibromyalgia:  Uses ultram for pain HTN:  Well controlled.  Continue current medications and low sodium Dash type diet.   Chol:  Back on crestor f/u lipid clinic   Lab Results  Component Value Date   LDLCALC 161 (H) 02/16/2017  Vision: blurry carotids fine f/u ophthalmologyA1c 5.8 no DM     Jenkins Rouge

## 2017-03-10 NOTE — Progress Notes (Signed)
Patient ID: [name]                 DOB: 14-Jul-1935                    MRN: [id number]     HPI: [name] is a 81 y.o. female patient referred to lipid clinic by Dr. Johnsie Cancel. PMH is significant for hypercholesterolemia, HTN, osteoporosis, fibromyalgia, GERD, and allergies. ECHO on 02/16/17 showed moderate to severe aortic stenosis, which is worsened from last ECHO in 2017, but not quite at surgical level per Dr. Johnsie Cancel (getting f/u ECHO every 6 months). Patient's last lipid panel was elevated, patient reported non-adherence to Crestor at that time.  Patient arrives to clinic today in good spirits. Reports no issues with Crestor and has been adherent. Discussed previous statin issues and patient doesn't recall having muscle pains with any of these medications before. She states that she has a lot of pain with her arthritis, bursitis, and fibromyalgia so it is difficult to tell if pain is from that or the medication, but said she would keep taking Crestor and call with any issues. Reports stopping all of her medications (including Crestor) previously due to lower leg swelling since she wasn't sure which medication might be causing it.   Current Medications: rosuvastatin 20mg  daily Intolerances: atorvastatin 40mg  and 80mg  daily (2014 - 2016), pravastatin 40mg  daily (2014), simvastatin 40mg  daily (2012) - isn't sure why any of them were stopped, doesn't remember having issues specifically. Thinks insurance wasn't covering Lipitor, so it was switched to Crestor. Risk Factors: Moderate to severe aortic stenosis, HTN LDL goal: < 70 mg/dL  Diet:  Breakfast: fried egg, toast or sweet bread Lunch: skips lunch Dinner: salad or broiled chicken, sometimes 5-6oz steak and potatoes or zucchini Only eats fresh or frozen vegetables. Doesn't like fried food or fast food and has cut back on her portion sizes. Drinks: 1 cup of coffee in the morning, water throughout the day, and lemonade or mixed fruit juice with  dinner  Exercise: Unable to exercise due to arthritis, bursitis, and fibromyalgia. Does a lot of housework to try to keep busy and moving.  Family History: Heart disease in sister. Colon cancer in son. Stomach cancer in paternal grandfather. Gallbladder disease in maternal grandmother. Alcoholic cirrhosis in mother.   Social History: Former smoker 0.25PPD x 5 years, quit in 2005. Drinks alcohol occasionally. Denies illicit drug use.  Last Lipid Panel:  02/16/17: TC 247, TG 185, HDL 49, LDL 161, Chol/HDL Ratio 5.0 (not taking Crestor) 08/12/16: TC 133, TG 123, HDL 50, LDL 58 (on Crestor 20mg  daily)  Past Medical History:  Diagnosis Date  . Anemia   . Aortic stenosis   . Arthritis   . Bruises easily   . CAD (coronary artery disease) CARDIOLOGIST- DR Jenkins Rouge-- LAST VISIT IN [facility]  . Cataracts, bilateral   . Chronic headaches   . Complication of anesthesia    bad reaction to ether  . Diverticulosis of colon   . Eczema   . Fibromyalgia   . GERD (gastroesophageal reflux disease)   . H/O hypoglycemia   . Heart murmur   . Hemorrhoids    INTERNAL--  POST BANDING 02/ 2015  . History of adenomatous polyp of colon   . History of colon polyps   . History of kidney stones   . History of lower GI bleeding    SECONDARY TO INTERNAL HEMORRHOIDS 02/  2015--  RESOLVED  .  Hyperlipidemia   . Hypertension   . Hypothyroidism, postsurgical   . Moderate aortic stenosis   . OSA (obstructive sleep apnea) CPAP INTOLERANT   . Osteopenia   . Osteoporosis   . Spinal stenosis, multilevel   . Spondylosis, cervical   . Tenosynovitis of wrist    LEFT    Current Outpatient Medications on File Prior to Visit  Medication Sig Dispense Refill  . Aloe Vera 25 MG CAPS Take 1 capsule by mouth daily.    Marland Kitchen amoxicillin-clavulanate (AUGMENTIN) 875-125 MG tablet Take 1 tablet by mouth 2 (two) times daily. 20 tablet 0  . ascorbic acid (VITAMIN C) 1000 MG tablet Take 1,000 mg by mouth daily.    Marland Kitchen aspirin 81 MG  tablet Take 81 mg by mouth daily.     Marland Kitchen BEE POLLEN PO Take 400 mg by mouth daily.    . clobetasol cream (TEMOVATE) 7.34 % Apply 1 application topically 2 (two) times daily as needed.    . Coenzyme Q10 (CO Q 10) 100 MG CAPS Take 1 capsule by mouth daily.    Marland Kitchen conjugated estrogens (PREMARIN) vaginal cream Place 1 Applicatorful vaginally daily. 42.5 g 12  . denosumab (PROLIA) 60 MG/ML SOLN injection Inject 60 mg into the skin every 6 (six) months. Administer in upper arm, thigh, or abdomen 1 mL 0  . metoprolol tartrate (LOPRESSOR) 50 MG tablet TAKE 1 TABLET BY MOUTH TWICE A DAY 180 tablet 1  . Misc Natural Products (TUMERSAID) TABS Take 75 mg by mouth daily.    . polyethylene glycol powder (GLYCOLAX/MIRALAX) powder Take 127.5 g by mouth daily. 765 g 3  . rosuvastatin (CRESTOR) 20 MG tablet Take 1 tablet (20 mg total) daily by mouth. 90 tablet 3  . Sennosides-Docusate Sodium 8.6-50 MG CAPS Take 1 capsule by mouth as needed.     No current facility-administered medications on file prior to visit.     Allergies  Allergen Reactions  . Tape Other (See Comments)    Dermatitis rash "with extended exposure"    Assessment/Plan:  1. Hyperlipidemia - Per most recent lipid panel, LDL remains above patient's goal of < 70 mg/dL, however she was not adherent to Crestor at that time. Congratulated patient on improving diet and staying active around the house. Reinforced importance of adherence to Crestor and patient confirmed understanding. Repeat lipid panel in February 2019. Follow up phone call at that time, will make medication changes as needed based on lipids.  Patient seen with [name], PharmD Candidate

## 2017-03-11 ENCOUNTER — Encounter (INDEPENDENT_AMBULATORY_CARE_PROVIDER_SITE_OTHER): Payer: Self-pay

## 2017-03-11 ENCOUNTER — Ambulatory Visit (INDEPENDENT_AMBULATORY_CARE_PROVIDER_SITE_OTHER): Payer: Medicare Other | Admitting: Pharmacist

## 2017-03-11 ENCOUNTER — Encounter: Payer: Self-pay | Admitting: Cardiovascular Disease

## 2017-03-11 ENCOUNTER — Ambulatory Visit: Payer: Medicare Other | Admitting: Cardiovascular Disease

## 2017-03-11 VITALS — BP 128/72 | HR 64 | Ht 62.0 in | Wt 160.5 lb

## 2017-03-11 DIAGNOSIS — I10 Essential (primary) hypertension: Secondary | ICD-10-CM

## 2017-03-11 DIAGNOSIS — E7800 Pure hypercholesterolemia, unspecified: Secondary | ICD-10-CM

## 2017-03-11 DIAGNOSIS — I350 Nonrheumatic aortic (valve) stenosis: Secondary | ICD-10-CM | POA: Diagnosis not present

## 2017-03-11 NOTE — Patient Instructions (Addendum)

## 2017-03-11 NOTE — Progress Notes (Signed)
I agree with the assessment and plan as documented above.   Thank you, Lelan Pons. Patterson Hammersmith, PharmD, [place]  [address], [place], [place] [address]  Phone: [phone]; Fax: [phone] 03/11/2017 2:56 PM

## 2017-03-11 NOTE — Patient Instructions (Addendum)
It was great to see you today!  1. Continue taking your Crestor 20mg  daily  2. Keep up the good work with your diet and exercise when you can  3. Follow up in February for labs. Make sure you don't eat after midnight the night before.  4. If you have any issues or questions, please call us at [phone]

## 2017-03-19 ENCOUNTER — Encounter: Payer: Self-pay | Admitting: Family Medicine

## 2017-03-19 ENCOUNTER — Ambulatory Visit (INDEPENDENT_AMBULATORY_CARE_PROVIDER_SITE_OTHER): Payer: Medicare Other | Admitting: Family Medicine

## 2017-03-19 VITALS — BP 117/55 | HR 61 | Temp 98.2°F | Ht 62.0 in | Wt 162.0 lb

## 2017-03-19 DIAGNOSIS — M810 Age-related osteoporosis without current pathological fracture: Secondary | ICD-10-CM

## 2017-03-19 DIAGNOSIS — I10 Essential (primary) hypertension: Secondary | ICD-10-CM | POA: Diagnosis not present

## 2017-03-19 DIAGNOSIS — E782 Mixed hyperlipidemia: Secondary | ICD-10-CM | POA: Diagnosis not present

## 2017-03-19 DIAGNOSIS — E7800 Pure hypercholesterolemia, unspecified: Secondary | ICD-10-CM | POA: Diagnosis not present

## 2017-03-19 LAB — URINALYSIS
Bilirubin, UA: NEGATIVE
Glucose, UA: NEGATIVE
LEUKOCYTES UA: NEGATIVE
NITRITE UA: NEGATIVE
Specific Gravity, UA: 1.015 (ref 1.005–1.030)
Urobilinogen, Ur: 1 mg/dL (ref 0.2–1.0)
pH, UA: 8.5 — ABNORMAL HIGH (ref 5.0–7.5)

## 2017-03-19 MED ORDER — CLOBETASOL PROPIONATE 0.05 % EX CREA: 1.0000 "application " | TOPICAL_CREAM | Freq: Two times a day (BID) | CUTANEOUS | 3 refills | Status: DC | PRN

## 2017-03-19 MED ORDER — ROSUVASTATIN CALCIUM 5 MG PO TABS: 5.0000 mg | ORAL_TABLET | Freq: Every day | ORAL | 1 refills | Status: DC

## 2017-03-19 MED ORDER — METOPROLOL TARTRATE 50 MG PO TABS: 50.0000 mg | ORAL_TABLET | Freq: Two times a day (BID) | ORAL | 1 refills | Status: DC

## 2017-03-19 NOTE — Progress Notes (Signed)
Subjective:  Patient ID: [name], female    DOB: [birth year]  Age: 81 y.o. MRN: [id number]  CC: Follow-up (pt here today for 3 month follow up after starting prolia which she thought was helping but then was put on Crestor which she thinks is causing the "body pain, bone pain")   HPI [name] presents for follow-up of elevated cholesterol. Doing well without complaints on current medication. Reports  Myalgia from rosuvastatin . Also in today for liver function testing. Currently no chest pain, shortness of breath or other cardiovascular related symptoms noted. Feels a little tired sometimes.  Prolia injection went well. No noted side effects after injection.   Follow-up of hypertension. Patient has no history of headache chest pain or shortness of breath or recent cough. Patient also denies symptoms of TIA such as numbness weakness lateralizing. Patient denies side effects from his medication. States taking it regularly. As History [name] has a past medical history of Anemia, Aortic stenosis, Arthritis, Bruises easily, CAD (coronary artery disease) (CARDIOLOGIST- DR Jenkins Rouge-- LAST VISIT IN [facility]), Cataracts, bilateral, Chronic headaches, Complication of anesthesia, Diverticulosis of colon, Eczema, Fibromyalgia, GERD (gastroesophageal reflux disease), H/O hypoglycemia, Heart murmur, Hemorrhoids, History of adenomatous polyp of colon, History of colon polyps, History of kidney stones, History of lower GI bleeding, Hyperlipidemia, Hypertension, Hypothyroidism, postsurgical, Moderate aortic stenosis, OSA (obstructive sleep apnea) (CPAP INTOLERANT ), Osteopenia, Osteoporosis, Spinal stenosis, multilevel, Spondylosis, cervical, and Tenosynovitis of wrist.   She has a past surgical history that includes Umbilical hernia repair (JUNE 2006); Vaginal hysterectomy (01-31-2001); Anterior cervical decomp/discectomy fusion (11-11-1999); Thyroidectomy (AGE 64); Hemorrhoidectomy with hemorrhoid banding  (08-07-2010); Laparoscopic cholecystectomy (1990); Appendectomy (AGE 61); Tonsillectomy and adenoidectomy (AGE 44); Cataract extraction w/ intraocular lens  implant, bilateral (2012); transthoracic echocardiogram (last one 04-20-2013  [facility]); Cardiac catheterization (2008  DR [facility]); Shoulder arthroscopy with open rotator cuff repair and distal clavicle acrominectomy (Right, 05/25/2012); Flexible sigmoidoscopy (N/A, 06/01/2013); and Tenosynovectomy (Left, 01/04/2014).   Her family history includes Cancer (age of onset: 17) in her son; Cirrhosis in her mother; Gallbladder disease in her maternal grandmother; Heart disease in her sister; Other in her brother; Stomach cancer in her paternal grandfather.She reports that she quit smoking about 13 years ago. Her smoking use included cigarettes. She has a 1.25 pack-year smoking history. she has never used smokeless tobacco. She reports that she drinks alcohol. She reports that she does not use drugs.  Current Outpatient Medications on File Prior to Visit  Medication Sig Dispense Refill  . Aloe Vera 25 MG CAPS Take 1 capsule by mouth daily.    Marland Kitchen ascorbic acid (VITAMIN C) 1000 MG tablet Take 1,000 mg by mouth daily.    Marland Kitchen aspirin 81 MG tablet Take 81 mg by mouth daily.     Marland Kitchen BEE POLLEN PO Take 400 mg by mouth daily.    . Coenzyme Q10 (CO Q 10) 100 MG CAPS Take 1 capsule by mouth daily.    Marland Kitchen conjugated estrogens (PREMARIN) vaginal cream Place 1 Applicatorful vaginally daily. 42.5 g 12  . denosumab (PROLIA) 60 MG/ML SOLN injection Inject 60 mg into the skin every 6 (six) months. Administer in upper arm, thigh, or abdomen 1 mL 0  . meloxicam (MOBIC) 15 MG tablet Take 15 mg by mouth daily.    . Misc Natural Products (TUMERSAID) TABS Take 75 mg by mouth daily.    . polyethylene glycol powder (GLYCOLAX/MIRALAX) powder Take 127.5 g by mouth daily. 765 g 3  .  Sennosides-Docusate Sodium 8.6-50 MG CAPS Take 1 capsule by mouth as needed.     No current  facility-administered medications on file prior to visit.     ROS Review of Systems  Constitutional: Negative for activity change, appetite change and fever.  HENT: Negative for congestion, rhinorrhea and sore throat.   Eyes: Negative for visual disturbance.  Respiratory: Negative for cough and shortness of breath.   Cardiovascular: Negative for chest pain and palpitations.  Gastrointestinal: Negative for abdominal pain, diarrhea and nausea.  Genitourinary: Negative for dysuria.  Musculoskeletal: Negative for arthralgias and myalgias.    Objective:  BP (!) 117/55   Pulse 61   Temp 98.2 F (36.8 C) (Oral)   Ht 5' 2"  (1.575 m)   Wt 162 lb (73.5 kg)   BMI 29.63 kg/m   BP Readings from Last 3 Encounters:  03/19/17 (!) 117/55  03/11/17 128/72  12/17/16 131/60    Wt Readings from Last 3 Encounters:  03/19/17 162 lb (73.5 kg)  03/11/17 160 lb 8 oz (72.8 kg)  12/17/16 158 lb (71.7 kg)     Physical Exam  Constitutional: She is oriented to person, place, and time. She appears well-developed and well-nourished. No distress.  HENT:  Head: Normocephalic and atraumatic.  Eyes: Conjunctivae are normal. Pupils are equal, round, and reactive to light.  Neck: Normal range of motion. Neck supple. No thyromegaly present.  Cardiovascular: Normal rate, regular rhythm and normal heart sounds.  No murmur heard. Pulmonary/Chest: Effort normal and breath sounds normal. No respiratory distress. She has no wheezes. She has no rales.  Abdominal: Soft. Bowel sounds are normal. She exhibits no distension. There is no tenderness.  Musculoskeletal: Normal range of motion.  Lymphadenopathy:    She has no cervical adenopathy.  Neurological: She is alert and oriented to person, place, and time.  Skin: Skin is warm and dry.  Psychiatric: She has a normal mood and affect. Her behavior is normal. Judgment and thought content normal.    Lab Results  Component Value Date   HGBA1C 5.8 (H) 08/12/2016      Lab Results  Component Value Date   WBC 7.9 03/19/2017   HGB 13.4 03/19/2017   HCT 39.2 03/19/2017   PLT 230 03/19/2017   GLUCOSE 100 (H) 03/19/2017   CHOL 247 (H) 02/16/2017   TRIG 185 (H) 02/16/2017   HDL 49 02/16/2017   LDLCALC 161 (H) 02/16/2017   ALT 17 03/19/2017   AST 21 03/19/2017   NA 141 03/19/2017   K 4.2 03/19/2017   CL 100 03/19/2017   CREATININE 0.75 03/19/2017   BUN 12 03/19/2017   CO2 31 (H) 03/19/2017   TSH 2.680 04/21/2016   INR 1.01 05/29/2013   HGBA1C 5.8 (H) 08/12/2016    No results found.  Assessment & Plan:   [name] was seen today for follow-up.  Diagnoses and all orders for this visit:  Age-related osteoporosis without current pathological fracture  Essential hypertension -     CBC with Differential/Platelet -     CMP14+EGFR -     Urinalysis  Mixed hyperlipidemia -     CBC with Differential/Platelet -     CMP14+EGFR  HYPERCHOLESTEROLEMIA  IIA  Other orders -     rosuvastatin (CRESTOR) 5 MG tablet; Take 1 tablet (5 mg total) by mouth daily. -     clobetasol cream (TEMOVATE) 0.05 %; Apply 1 application topically 2 (two) times daily as needed. -     metoprolol tartrate (LOPRESSOR) 50 MG  tablet; Take 1 tablet (50 mg total) by mouth 2 (two) times daily.   I have discontinued [name]'s rosuvastatin. I have also changed her metoprolol tartrate. Additionally, I am having her start on rosuvastatin. Lastly, I am having her maintain her aspirin, Aloe Vera, BEE POLLEN PO, ascorbic acid, Co Q 10, TUMERSAID, Sennosides-Docusate Sodium, polyethylene glycol powder, conjugated estrogens, denosumab, meloxicam, and clobetasol cream.  Meds ordered this encounter  Medications  . rosuvastatin (CRESTOR) 5 MG tablet    Sig: Take 1 tablet (5 mg total) by mouth daily.    Dispense:  90 tablet    Refill:  1  . clobetasol cream (TEMOVATE) 0.05 %    Sig: Apply 1 application topically 2 (two) times daily as needed.    Dispense:  30 g    Refill:  3  .  metoprolol tartrate (LOPRESSOR) 50 MG tablet    Sig: Take 1 tablet (50 mg total) by mouth 2 (two) times daily.    Dispense:  180 tablet    Refill:  1     Follow-up: Return in about 3 months (around 06/17/2017).  [name], M.D.

## 2017-03-20 LAB — CBC WITH DIFFERENTIAL/PLATELET
BASOS ABS: 0 10*3/uL (ref 0.0–0.2)
Basos: 1 %
EOS (ABSOLUTE): 0.2 10*3/uL (ref 0.0–0.4)
EOS: 3 %
HEMATOCRIT: 39.2 % (ref 34.0–46.6)
HEMOGLOBIN: 13.4 g/dL (ref 11.1–15.9)
IMMATURE GRANS (ABS): 0 10*3/uL (ref 0.0–0.1)
Immature Granulocytes: 0 %
Lymphocytes Absolute: 2.4 10*3/uL (ref 0.7–3.1)
Lymphs: 31 %
MCH: 30 pg (ref 26.6–33.0)
MCHC: 34.2 g/dL (ref 31.5–35.7)
MCV: 88 fL (ref 79–97)
MONOCYTES: 10 %
Monocytes Absolute: 0.8 10*3/uL (ref 0.1–0.9)
NEUTROS PCT: 55 %
Neutrophils Absolute: 4.4 10*3/uL (ref 1.4–7.0)
Platelets: 230 10*3/uL (ref 150–379)
RBC: 4.46 x10E6/uL (ref 3.77–5.28)
RDW: 13.3 % (ref 12.3–15.4)
WBC: 7.9 10*3/uL (ref 3.4–10.8)

## 2017-03-20 LAB — CMP14+EGFR
ALBUMIN: 4.3 g/dL (ref 3.5–4.8)
ALK PHOS: 53 IU/L (ref 39–117)
ALT: 17 IU/L (ref 0–32)
AST: 21 IU/L (ref 0–40)
Albumin/Globulin Ratio: 2 (ref 1.2–2.2)
BUN/Creatinine Ratio: 16 (ref 12–28)
BUN: 12 mg/dL (ref 8–27)
Bilirubin Total: 0.3 mg/dL (ref 0.0–1.2)
CALCIUM: 9.5 mg/dL (ref 8.7–10.3)
CHLORIDE: 100 mmol/L (ref 96–106)
CO2: 31 mmol/L — AB (ref 20–29)
CREATININE: 0.75 mg/dL (ref 0.57–1.00)
GFR, EST AFRICAN AMERICAN: 88 mL/min/{1.73_m2} (ref >59)
GFR, EST NON AFRICAN AMERICAN: 76 mL/min/{1.73_m2} (ref >59)
GLOBULIN, TOTAL: 2.2 g/dL (ref 1.5–4.5)
GLUCOSE: 100 mg/dL — AB (ref 65–99)
POTASSIUM: 4.2 mmol/L (ref 3.5–5.2)
Sodium: 141 mmol/L (ref 134–144)
Total Protein: 6.5 g/dL (ref 6.0–8.5)

## 2017-03-21 ENCOUNTER — Encounter: Payer: Self-pay | Admitting: Family Medicine

## 2017-05-04 ENCOUNTER — Ambulatory Visit: Payer: Medicare Other | Admitting: Family Medicine

## 2017-05-04 NOTE — Progress Notes (Deleted)
Subjective: CC: leg pain PCP: [name], MD HPI:[name] is a 82 y.o. female presenting to clinic today for:  1. Leg pain ***   ROS: Per HPI  Allergies  Allergen Reactions  . Tape Other (See Comments)    Dermatitis rash "with extended exposure"   Past Medical History:  Diagnosis Date  . Anemia   . Aortic stenosis   . Arthritis   . Bruises easily   . CAD (coronary artery disease) CARDIOLOGIST- DR Jenkins Rouge-- LAST VISIT IN [facility]  . Cataracts, bilateral   . Chronic headaches   . Complication of anesthesia    bad reaction to ether  . Diverticulosis of colon   . Eczema   . Fibromyalgia   . GERD (gastroesophageal reflux disease)   . H/O hypoglycemia   . Heart murmur   . Hemorrhoids    INTERNAL--  POST BANDING 02/ 2015  . History of adenomatous polyp of colon   . History of colon polyps   . History of kidney stones   . History of lower GI bleeding    SECONDARY TO INTERNAL HEMORRHOIDS 02/  2015--  RESOLVED  . Hyperlipidemia   . Hypertension   . Hypothyroidism, postsurgical   . Moderate aortic stenosis   . OSA (obstructive sleep apnea) CPAP INTOLERANT   . Osteopenia   . Osteoporosis   . Spinal stenosis, multilevel   . Spondylosis, cervical   . Tenosynovitis of wrist    LEFT    Current Outpatient Medications:  .  Aloe Vera 25 MG CAPS, Take 1 capsule by mouth daily., Disp: , Rfl:  .  ascorbic acid (VITAMIN C) 1000 MG tablet, Take 1,000 mg by mouth daily., Disp: , Rfl:  .  aspirin 81 MG tablet, Take 81 mg by mouth daily. , Disp: , Rfl:  .  BEE POLLEN PO, Take 400 mg by mouth daily., Disp: , Rfl:  .  clobetasol cream (TEMOVATE) 8.34 %, Apply 1 application topically 2 (two) times daily as needed., Disp: 30 g, Rfl: 3 .  Coenzyme Q10 (CO Q 10) 100 MG CAPS, Take 1 capsule by mouth daily., Disp: , Rfl:  .  conjugated estrogens (PREMARIN) vaginal cream, Place 1 Applicatorful vaginally daily., Disp: 42.5 g, Rfl: 12 .  denosumab (PROLIA) 60 MG/ML SOLN injection,  Inject 60 mg into the skin every 6 (six) months. Administer in upper arm, thigh, or abdomen, Disp: 1 mL, Rfl: 0 .  meloxicam (MOBIC) 15 MG tablet, Take 15 mg by mouth daily., Disp: , Rfl:  .  metoprolol tartrate (LOPRESSOR) 50 MG tablet, Take 1 tablet (50 mg total) by mouth 2 (two) times daily., Disp: 180 tablet, Rfl: 1 .  Misc Natural Products (TUMERSAID) TABS, Take 75 mg by mouth daily., Disp: , Rfl:  .  polyethylene glycol powder (GLYCOLAX/MIRALAX) powder, Take 127.5 g by mouth daily., Disp: 765 g, Rfl: 3 .  rosuvastatin (CRESTOR) 5 MG tablet, Take 1 tablet (5 mg total) by mouth daily., Disp: 90 tablet, Rfl: 1 .  Sennosides-Docusate Sodium 8.6-50 MG CAPS, Take 1 capsule by mouth as needed., Disp: , Rfl:  Social History   Socioeconomic History  . Marital status: Married    Spouse name: Not on file  . Number of children: 3  . Years of education: Not on file  . Highest education level: Not on file  Social Needs  . Financial resource strain: Not on file  . Food insecurity - worry: Not on file  . Food insecurity -  inability: Not on file  . Transportation needs - medical: Not on file  . Transportation needs - non-medical: Not on file  Occupational History  . Not on file  Tobacco Use  . Smoking status: Former Smoker    Packs/day: 0.25    Years: 5.00    Pack years: 1.25    Types: Cigarettes    Last attempt to quit: 04/14/2003    Years since quitting: 14.0  . Smokeless tobacco: Never Used  Substance and Sexual Activity  . Alcohol use: Yes    Comment: once in a while-social  . Drug use: No  . Sexual activity: Not Currently  Other Topics Concern  . Not on file  Social History Narrative  . Not on file   Family History  Problem Relation Age of Onset  . Cirrhosis Mother        drinking  . Other Brother        duodenal ulcer  . Heart disease Sister   . Cancer Son 25       colon  . Gallbladder disease Maternal Grandmother   . Stomach cancer Paternal Grandfather      Objective: Office vital signs reviewed. There were no vitals taken for this visit.  Physical Examination:  General: Awake, alert, *** nourished, No acute distress HEENT: Normal    Neck: No masses palpated. No lymphadenopathy    Ears: Tympanic membranes intact, normal light reflex, no erythema, no bulging    Eyes: PERRLA, extraocular membranes intact, sclera ***    Nose: nasal turbinates moist, *** nasal discharge    Throat: moist mucus membranes, no erythema, *** tonsillar exudate.  Airway is patent Cardio: regular rate and rhythm, S1S2 heard, no murmurs appreciated Pulm: clear to auscultation bilaterally, no wheezes, rhonchi or rales; normal work of breathing on room air GI: soft, non-tender, non-distended, bowel sounds present x4, no hepatomegaly, no splenomegaly, no masses GU: external vaginal tissue ***, cervix ***, *** punctate lesions on cervix appreciated, *** discharge from cervical os, *** bleeding, *** cervical motion tenderness, *** abdominal/ adnexal masses Extremities: warm, well perfused, No edema, cyanosis or clubbing; +*** pulses bilaterally MSK: *** gait and *** station Skin: dry; intact; no rashes or lesions Neuro: *** Strength and light touch sensation grossly intact, *** DTRs ***/4  Assessment/ Plan: 82 y.o. female   ***  No orders of the defined types were placed in this encounter.  No orders of the defined types were placed in this encounter.    [name], DO [facility] [phone]

## 2017-05-11 ENCOUNTER — Ambulatory Visit (INDEPENDENT_AMBULATORY_CARE_PROVIDER_SITE_OTHER): Payer: Medicare Other | Admitting: Family Medicine

## 2017-05-11 ENCOUNTER — Encounter: Payer: Self-pay | Admitting: Family Medicine

## 2017-05-11 VITALS — BP 154/67 | HR 68 | Temp 98.1°F | Ht 62.0 in | Wt 160.0 lb

## 2017-05-11 DIAGNOSIS — R202 Paresthesia of skin: Secondary | ICD-10-CM

## 2017-05-11 DIAGNOSIS — M79605 Pain in left leg: Secondary | ICD-10-CM

## 2017-05-11 DIAGNOSIS — J40 Bronchitis, not specified as acute or chronic: Secondary | ICD-10-CM

## 2017-05-11 DIAGNOSIS — M79604 Pain in right leg: Secondary | ICD-10-CM | POA: Diagnosis not present

## 2017-05-11 DIAGNOSIS — R200 Anesthesia of skin: Secondary | ICD-10-CM | POA: Diagnosis not present

## 2017-05-11 MED ORDER — AZITHROMYCIN 250 MG PO TABS
ORAL_TABLET | ORAL | 0 refills | Status: DC
Start: 2017-05-11 — End: 2017-06-29

## 2017-05-11 NOTE — Progress Notes (Signed)
Subjective: CC: foot numbness PCP: [name], MD HPI:[name] is a 82 y.o. female presenting to clinic today for:  1. Foot numbness Patient reports onset of bilateral foot numbness/tingling/pain about 3 weeks ago.  She notes that her legs feel heavy during physical activity and that the numbness/tingling/pain is more predominant with movement, particularly walking around and standing for prolonged periods.  She denies lower extremity swelling, shortness of breath.  She feels that her feet become cold frequently.  She denies overt discoloration.  She has a past medical history of aortic stenosis.  No history of diabetes.  No alcohol use.  2.  Cough Patient reports that she has had a productive cough for over a week.  She denies fevers but reports chills.  She denies shortness of breath.  She denies hemoptysis.  She has been using NyQuil and Robitussin with little improvement in symptoms.  Past medical history significant for walking pneumonia x3 per patient report.   ROS: Per HPI  Allergies  Allergen Reactions  . Tape Other (See Comments)    Dermatitis rash "with extended exposure"   Past Medical History:  Diagnosis Date  . Anemia   . Aortic stenosis   . Arthritis   . Bruises easily   . CAD (coronary artery disease) CARDIOLOGIST- DR Jenkins [place]-- LAST VISIT IN [facility]  . Cataracts, bilateral   . Chronic headaches   . Complication of anesthesia    bad reaction to ether  . Diverticulosis of colon   . Eczema   . Fibromyalgia   . GERD (gastroesophageal reflux disease)   . H/O hypoglycemia   . Heart murmur   . Hemorrhoids    INTERNAL--  POST BANDING 02/ 2015  . History of adenomatous polyp of colon   . History of colon polyps   . History of kidney stones   . History of lower GI bleeding    SECONDARY TO INTERNAL HEMORRHOIDS 02/  2015--  RESOLVED  . Hyperlipidemia   . Hypertension   . Hypothyroidism, postsurgical   . Moderate aortic stenosis   . OSA (obstructive sleep  apnea) CPAP INTOLERANT   . Osteopenia   . Osteoporosis   . Spinal stenosis, multilevel   . Spondylosis, cervical   . Tenosynovitis of wrist    LEFT    Current Outpatient Medications:  .  Aloe Vera 25 MG CAPS, Take 1 capsule by mouth daily., Disp: , Rfl:  .  ascorbic acid (VITAMIN C) 1000 MG tablet, Take 1,000 mg by mouth daily., Disp: , Rfl:  .  aspirin 81 MG tablet, Take 81 mg by mouth daily. , Disp: , Rfl:  .  BEE POLLEN PO, Take 400 mg by mouth daily., Disp: , Rfl:  .  clobetasol cream (TEMOVATE) 3.53 %, Apply 1 application topically 2 (two) times daily as needed., Disp: 30 g, Rfl: 3 .  Coenzyme Q10 (CO Q 10) 100 MG CAPS, Take 1 capsule by mouth daily., Disp: , Rfl:  .  conjugated estrogens (PREMARIN) vaginal cream, Place 1 Applicatorful vaginally daily., Disp: 42.5 g, Rfl: 12 .  denosumab (PROLIA) 60 MG/ML SOLN injection, Inject 60 mg into the skin every 6 (six) months. Administer in upper arm, thigh, or abdomen, Disp: 1 mL, Rfl: 0 .  meloxicam (MOBIC) 15 MG tablet, Take 15 mg by mouth daily., Disp: , Rfl:  .  metoprolol tartrate (LOPRESSOR) 50 MG tablet, Take 1 tablet (50 mg total) by mouth 2 (two) times daily., Disp: 180 tablet, Rfl: 1 .  Misc Natural Products (TUMERSAID) TABS, Take 75 mg by mouth daily., Disp: , Rfl:  .  polyethylene glycol powder (GLYCOLAX/MIRALAX) powder, Take 127.5 g by mouth daily., Disp: 765 g, Rfl: 3 .  rosuvastatin (CRESTOR) 5 MG tablet, Take 1 tablet (5 mg total) by mouth daily., Disp: 90 tablet, Rfl: 1 .  Sennosides-Docusate Sodium 8.6-50 MG CAPS, Take 1 capsule by mouth as needed., Disp: , Rfl:  Social History   Socioeconomic History  . Marital status: Married    Spouse name: Not on file  . Number of children: 3  . Years of education: Not on file  . Highest education level: Not on file  Social Needs  . Financial resource strain: Not on file  . Food insecurity - worry: Not on file  . Food insecurity - inability: Not on file  . Transportation needs  - medical: Not on file  . Transportation needs - non-medical: Not on file  Occupational History  . Not on file  Tobacco Use  . Smoking status: Former Smoker    Packs/day: 0.25    Years: 5.00    Pack years: 1.25    Types: Cigarettes    Last attempt to quit: 04/14/2003    Years since quitting: 14.0  . Smokeless tobacco: Never Used  Substance and Sexual Activity  . Alcohol use: Yes    Comment: once in a while-social  . Drug use: No  . Sexual activity: Not Currently  Other Topics Concern  . Not on file  Social History Narrative  . Not on file   Family History  Problem Relation Age of Onset  . Cirrhosis Mother        drinking  . Other Brother        duodenal ulcer  . Heart disease Sister   . Cancer Son 8       colon  . Gallbladder disease Maternal Grandmother   . Stomach cancer Paternal Grandfather     Objective: Office vital signs reviewed. BP (!) 154/67   Pulse 68   Temp 98.1 F (36.7 C) (Oral)   Ht 5\' 2"  (1.575 m)   Wt 160 lb (72.6 kg)   BMI 29.26 kg/m   Physical Examination:  General: Awake, alert, well nourished, nontoxic appearing, No acute distress HEENT: No JVD Cardio: regular rate and rhythm, S1S2 heard, 2/6 SEM appreciated at LSB and RSB Pulm: slight wheeze in RUL field, No rhonchi or rales; normal work of breathing on room air MSK: ambulates independently Extremities: cool, +2 dorsalis pedis pulses, +1 posterior tibial pulses, hallucis valgus appreciated bilaterally. Skin:  no ulceration, no evidence of infection Neuro: light touch sensation grossly in tact  Assessment/ Plan: 82 y.o. female   1. Bilateral lower extremity pain I am somewhat concerned about PAD in this patient.  Her description of pain is suggestive of claudication.  She has known aortic stenosis.  Atherosclerosis of the aorta was appreciated on her February 2017 CT abdomen pelvis.  Will obtain ABIs to further evaluate.  Differential diagnosis includes nerve pain from the low back, she  does have a history of degenerative disc disease.  However given bilateral nature, I think that PAD would be more likely.  No known diabetes to suggest diabetic neuropathy.  Patient does not have a history of alcoholism to suggest neuropathy secondary to EtOH consumption.  Will contact patient with results once they are available. - VAS [place] ABI WITH/WO TBI; Future  2. Numbness and tingling of both lower extremities - VAS  [place] ABI WITH/WO TBI; Future  3. Bronchitis Pulmonary exam with some intermittent wheeze in the right upper lung fields.  Will empirically treat with antibiotics, particularly given history of pneumonia in the past.  Azithromycin was prescribed.  Home care instructions reviewed.  Avoid OTC cough and cold medications that are not intended for high blood pressure.  Handout provided.  Follow-up as needed.   Meds ordered this encounter  Medications  . azithromycin (ZITHROMAX Z-PAK) 250 MG tablet    Sig: Take 500mg  day 1, then take 250mg  days 2-5.    Dispense:  6 tablet    Refill:  0     Tanea Moga Windell Moulding, DO [place] [phone]

## 2017-05-11 NOTE — Patient Instructions (Signed)
I have ordered vascular studies of your legs to evaluate the blood flow to your feet.  I am concerned about peripheral artery disease.  I will contact you once these studies are complete with the results and further instruction.  I have prescribed a Z-Pak for your symptoms.  The most common side effect of this medication is nausea.  Please take this with food and plenty of water.  I recommend that you only use cold medications that are safe in high blood pressure like Coricidin (generic is fine).  Other cold medications can increase your blood pressure.    - Get plenty of rest and drink plenty of fluids. - Try to breathe moist air. Use a cold mist humidifier. - Consume warm fluids (soup or tea) to provide relief for a stuffy nose and to loosen phlegm. - For nasal stuffiness, try saline nasal spray or a Neti Pot.  Afrin nasal spray can also be used but this product should not be used longer than 3 days or it will cause rebound nasal stuffiness (worsening nasal congestion). - For sore throat pain relief: suck on throat lozenges, hard candy or popsicles; gargle with warm salt water (1/4 tsp. salt per 8 oz. of water); and eat soft, bland foods. - Eat a well-balanced diet. If you cannot, ensure you are getting enough nutrients by taking a daily multivitamin. - Avoid dairy products, as they can thicken phlegm. - Avoid alcohol, as it impairs your body's immune system.  CONTACT YOUR DOCTOR IF YOU EXPERIENCE ANY OF THE FOLLOWING: - High fever - Ear pain - Sinus-type headache - Unusually severe cold symptoms - Cough that gets worse while other cold symptoms improve - Flare up of any chronic lung problem, such as asthma - Your symptoms persist longer than 2 weeks

## 2017-05-24 ENCOUNTER — Ambulatory Visit (INDEPENDENT_AMBULATORY_CARE_PROVIDER_SITE_OTHER): Payer: Medicare Other | Admitting: Family Medicine

## 2017-05-24 ENCOUNTER — Encounter: Payer: Self-pay | Admitting: Family Medicine

## 2017-05-24 VITALS — BP 142/67 | HR 60 | Temp 97.0°F | Ht 62.0 in | Wt 159.0 lb

## 2017-05-24 DIAGNOSIS — R5383 Other fatigue: Secondary | ICD-10-CM | POA: Diagnosis not present

## 2017-05-24 DIAGNOSIS — M26622 Arthralgia of left temporomandibular joint: Secondary | ICD-10-CM

## 2017-05-24 NOTE — Progress Notes (Signed)
Subjective: CC: ear pain PCP: [name], MD HPI:[name] is a 82 y.o. female presenting to clinic today for:  1. Ear pain Patient reports a several month history of left-sided jaw and inner ear pain.  She notes its most prominent when she opens her mouth and chews.  She notes that sometimes she has a headache with this.  Denies temporal tenderness or pain.  No visual disturbance.  No personal or family history of autoimmune disease.  She has not taken anything for this.  She notes that she saw her dentist last year and had a few teeth removed.  She does have permanent dentures on the top.  2.  Concern for thyroid disorder Patient reports that when she was younger she actually had her thyroid removed.  She is unsure as to why it was removed.  She has not been on supplementation.  She is unsure if thyroid function has been checked recently but would like to have this checked.  She does report history of constipation, low energy, cold intolerance.  She also notes difficulty losing weight despite lifestyle modifications.   ROS: Per HPI  Allergies  Allergen Reactions  . Tape Other (See Comments)    Dermatitis rash "with extended exposure"   Past Medical History:  Diagnosis Date  . Anemia   . Aortic stenosis   . Arthritis   . Bruises easily   . CAD (coronary artery disease) CARDIOLOGIST- DR Jenkins Rouge-- LAST VISIT IN [facility]  . Cataracts, bilateral   . Chronic headaches   . Complication of anesthesia    bad reaction to ether  . Diverticulosis of colon   . Eczema   . Fibromyalgia   . GERD (gastroesophageal reflux disease)   . H/O hypoglycemia   . Heart murmur   . Hemorrhoids    INTERNAL--  POST BANDING 02/ 2015  . History of adenomatous polyp of colon   . History of colon polyps   . History of kidney stones   . History of lower GI bleeding    SECONDARY TO INTERNAL HEMORRHOIDS 02/  2015--  RESOLVED  . Hyperlipidemia   . Hypertension   . Hypothyroidism, postsurgical     . Moderate aortic stenosis   . OSA (obstructive sleep apnea) CPAP INTOLERANT   . Osteopenia   . Osteoporosis   . Spinal stenosis, multilevel   . Spondylosis, cervical   . Tenosynovitis of wrist    LEFT    Current Outpatient Medications:  .  Aloe Vera 25 MG CAPS, Take 1 capsule by mouth daily., Disp: , Rfl:  .  ascorbic acid (VITAMIN C) 1000 MG tablet, Take 1,000 mg by mouth daily., Disp: , Rfl:  .  aspirin 81 MG tablet, Take 81 mg by mouth daily. , Disp: , Rfl:  .  azithromycin (ZITHROMAX Z-PAK) 250 MG tablet, Take 517m day 1, then take 2525mdays 2-5., Disp: 6 tablet, Rfl: 0 .  BEE POLLEN PO, Take 400 mg by mouth daily., Disp: , Rfl:  .  clobetasol cream (TEMOVATE) 0.2.02, Apply 1 application topically 2 (two) times daily as needed., Disp: 30 g, Rfl: 3 .  Coenzyme Q10 (CO Q 10) 100 MG CAPS, Take 1 capsule by mouth daily., Disp: , Rfl:  .  conjugated estrogens (PREMARIN) vaginal cream, Place 1 Applicatorful vaginally daily., Disp: 42.5 g, Rfl: 12 .  denosumab (PROLIA) 60 MG/ML SOLN injection, Inject 60 mg into the skin every 6 (six) months. Administer in upper arm, thigh, or  abdomen, Disp: 1 mL, Rfl: 0 .  meloxicam (MOBIC) 15 MG tablet, Take 15 mg by mouth daily., Disp: , Rfl:  .  metoprolol tartrate (LOPRESSOR) 50 MG tablet, Take 1 tablet (50 mg total) by mouth 2 (two) times daily., Disp: 180 tablet, Rfl: 1 .  Misc Natural Products (TUMERSAID) TABS, Take 75 mg by mouth daily., Disp: , Rfl:  .  polyethylene glycol powder (GLYCOLAX/MIRALAX) powder, Take 127.5 g by mouth daily., Disp: 765 g, Rfl: 3 .  rosuvastatin (CRESTOR) 5 MG tablet, Take 1 tablet (5 mg total) by mouth daily., Disp: 90 tablet, Rfl: 1 .  Sennosides-Docusate Sodium 8.6-50 MG CAPS, Take 1 capsule by mouth as needed., Disp: , Rfl:  Social History   Socioeconomic History  . Marital status: Married    Spouse name: Not on file  . Number of children: 3  . Years of education: Not on file  . Highest education level: Not on  file  Social Needs  . Financial resource strain: Not on file  . Food insecurity - worry: Not on file  . Food insecurity - inability: Not on file  . Transportation needs - medical: Not on file  . Transportation needs - non-medical: Not on file  Occupational History  . Not on file  Tobacco Use  . Smoking status: Former Smoker    Packs/day: 0.25    Years: 5.00    Pack years: 1.25    Types: Cigarettes    Last attempt to quit: 04/14/2003    Years since quitting: 14.1  . Smokeless tobacco: Never Used  Substance and Sexual Activity  . Alcohol use: Yes    Comment: once in a while-social  . Drug use: No  . Sexual activity: Not Currently  Other Topics Concern  . Not on file  Social History Narrative  . Not on file   Family History  Problem Relation Age of Onset  . Cirrhosis Mother        drinking  . Other Brother        duodenal ulcer  . Heart disease Sister   . Cancer Son 60       colon  . Gallbladder disease Maternal Grandmother   . Stomach cancer Paternal Grandfather     Objective: Office vital signs reviewed. BP (!) 142/67   Pulse 60   Temp (!) 97 F (36.1 C) (Oral)   Ht 5' 2"  (1.575 m)   Wt 159 lb (72.1 kg)   BMI 29.08 kg/m   Physical Examination:  General: Awake, alert, well nourished, well appearing, No acute distress HEENT: Palpable click on the left side at the TMJ.  Patient is able to open and close her mouth fully.  No tenderness to palpation along the temporal areas.    Neck: No masses palpated. No lymphadenopathy; thyroid not palpable.  She does have a well-healed horizontal scar at the base of the neck anteriorly.    Ears: Tympanic membranes intact, normal light reflex, no erythema, no bulging    Eyes: extraocular membranes intact, sclera white    Nose: nasal turbinates moist, no nasal discharge    Throat: moist mucus membranes, no erythema, no tonsillar exudate.  Airway is patent Skin: Cool, dry Neuro: No resting tremor seen.  Assessment/ Plan: 82  y.o. female   1. Arthralgia of left temporomandibular joint Likely TMJ syndrome.  However given unknown thyroid history and potential for autoimmune disease, I have ordered autoimmune workup to rule out giant cell arteritis.  My suspicion for  this is low and therefore I have not initiated oral steroids.  I did recommend the patient call her dentist to schedule an appointment to evaluate her dentures and consider oral appliance/night guard. - Satonya - C-reactive protein - Sedimentation Rate - CBC with Differential  2. Fatigue, unspecified type Possibly related to hypothyroidism.  She does have a well-healed surgical scar.  Difficult to tell whether or not she had a complete thyroidectomy or exactly what the nature was of the surgery.  Her TSH in 2018 was within normal limits.  We will recheck this today along with [place], CRP, ESR and CBC.  BMP also ordered to evaluate electrolyte and renal function. - TSH - Jamelyn - Basic Metabolic Panel - CBC with Differential    Orders Placed This Encounter  Procedures  . TSH  . Sreeja  . C-reactive protein  . Sedimentation Rate  . Basic Metabolic Panel  . CBC with Differential    [name], [place] [phone]

## 2017-05-24 NOTE — Patient Instructions (Signed)
I think that you have going on on the left side is something called TMJ syndrome.  This often occurs from worn out dentures or teeth grinding.  I do recommend that you schedule an appointment with your dentist for further evaluation and management.  In the meantime, I will order a couple of labs to rule out other possible causes.  The should be available within the next couple of days and I will contact you with them.  Temporomandibular Joint Syndrome Temporomandibular joint (TMJ) syndrome is a condition that affects the joints between your jaw and your skull. The TMJs are located near your ears and allow your jaw to open and close. These joints and the nearby muscles are involved in all movements of the jaw. People with TMJ syndrome have pain in the area of these joints and muscles. Chewing, biting, or other movements of the jaw can be difficult or painful. TMJ syndrome can be caused by various things. In many cases, the condition is mild and goes away within a few weeks. For some people, the condition can become a long-term problem. What are the causes? Possible causes of TMJ syndrome include:  Grinding your teeth or clenching your jaw. Some people do this when they are under stress.  Arthritis.  Injury to the jaw.  Head or neck injury.  Teeth or dentures that are not aligned well.  In some cases, the cause of TMJ syndrome may not be known. What are the signs or symptoms? The most common symptom is an aching pain on the side of the head in the area of the TMJ. Other symptoms may include:  Pain when moving your jaw, such as when chewing or biting.  Being unable to open your jaw all the way.  Making a clicking sound when you open your mouth.  Headache.  Earache.  Neck or shoulder pain.  How is this diagnosed? Diagnosis can usually be made based on your symptoms, your medical history, and a physical exam. Your health care provider may check the range of motion of your jaw. Imaging  tests, such as X-rays or an MRI, are sometimes done. You may need to see your dentist to determine if your teeth and jaw are lined up correctly. How is this treated? TMJ syndrome often goes away on its own. If treatment is needed, the options may include:  Eating soft foods and applying ice or heat.  Medicines to relieve pain or inflammation.  Medicines to relax the muscles.  A splint, bite plate, or mouthpiece to prevent teeth grinding or jaw clenching.  Relaxation techniques or counseling to help reduce stress.  Transcutaneous electrical nerve stimulation (TENS). This helps to relieve pain by applying an electrical current through the skin.  Acupuncture. This is sometimes helpful to relieve pain.  Jaw surgery. This is rarely needed.  Follow these instructions at home:  Take medicines only as directed by your health care provider.  Eat a soft diet if you are having trouble chewing.  Apply ice to the painful area. ? Put ice in a plastic bag. ? Place a towel between your skin and the bag. ? Leave the ice on for 20 minutes, 2-3 times a day.  Apply a warm compress to the painful area as directed.  Massage your jaw area and perform any jaw stretching exercises as recommended by your health care provider.  If you were given a mouthpiece or bite plate, wear it as directed.  Avoid foods that require a lot of chewing.  Do not chew gum.  Keep all follow-up visits as directed by your health care provider. This is important. Contact a health care provider if:  You are having trouble eating.  You have new or worsening symptoms. Get help right away if:  Your jaw locks open or closed. This information is not intended to replace advice given to you by your health care provider. Make sure you discuss any questions you have with your health care provider. Document Released: 12/23/2000 Document Revised: 11/28/2015 Document Reviewed: 11/02/2013 Elsevier Interactive Patient Education   [employer].

## 2017-05-25 ENCOUNTER — Other Ambulatory Visit: Payer: Self-pay | Admitting: Family Medicine

## 2017-05-25 ENCOUNTER — Other Ambulatory Visit: Payer: Medicare Other

## 2017-05-25 DIAGNOSIS — I10 Essential (primary) hypertension: Secondary | ICD-10-CM

## 2017-05-25 DIAGNOSIS — R768 Other specified abnormal immunological findings in serum: Secondary | ICD-10-CM

## 2017-05-25 LAB — CBC WITH DIFFERENTIAL/PLATELET
BASOS ABS: 0.1 10*3/uL (ref 0.0–0.2)
Basos: 1 %
EOS (ABSOLUTE): 0.3 10*3/uL (ref 0.0–0.4)
Eos: 4 %
Hematocrit: 40 % (ref 34.0–46.6)
Hemoglobin: 13.5 g/dL (ref 11.1–15.9)
IMMATURE GRANS (ABS): 0 10*3/uL (ref 0.0–0.1)
Immature Granulocytes: 0 %
LYMPHS: 36 %
Lymphocytes Absolute: 2.7 10*3/uL (ref 0.7–3.1)
MCH: 30 pg (ref 26.6–33.0)
MCHC: 33.8 g/dL (ref 31.5–35.7)
MCV: 89 fL (ref 79–97)
MONOS ABS: 0.6 10*3/uL (ref 0.1–0.9)
Monocytes: 8 %
Neutrophils Absolute: 3.9 10*3/uL (ref 1.4–7.0)
Neutrophils: 51 %
PLATELETS: 227 10*3/uL (ref 150–379)
RBC: 4.5 x10E6/uL (ref 3.77–5.28)
RDW: 13.5 % (ref 12.3–15.4)
WBC: 7.5 10*3/uL (ref 3.4–10.8)

## 2017-05-25 LAB — TSH: TSH: 3.23 u[IU]/mL (ref 0.450–4.500)

## 2017-05-25 LAB — BASIC METABOLIC PANEL
BUN / CREAT RATIO: 19 (ref 12–28)
BUN: 15 mg/dL (ref 8–27)
CO2: 22 mmol/L (ref 20–29)
CREATININE: 0.81 mg/dL (ref 0.57–1.00)
Calcium: 9.2 mg/dL (ref 8.7–10.3)
Chloride: 102 mmol/L (ref 96–106)
GFR calc non Af Amer: 69 mL/min/{1.73_m2} (ref >59)
GFR, EST AFRICAN AMERICAN: 80 mL/min/{1.73_m2} (ref >59)
Glucose: 101 mg/dL — ABNORMAL HIGH (ref 65–99)
Potassium: 4.9 mmol/L (ref 3.5–5.2)
Sodium: 140 mmol/L (ref 134–144)

## 2017-05-25 LAB — ANA: ANA: POSITIVE — AB

## 2017-05-25 LAB — LIPID PANEL
CHOLESTEROL TOTAL: 145 mg/dL (ref 100–199)
Chol/HDL Ratio: 3.1 ratio (ref 0.0–4.4)
HDL: 47 mg/dL (ref >39)
LDL Calculated: 71 mg/dL (ref 0–99)
Triglycerides: 136 mg/dL (ref 0–149)
VLDL CHOLESTEROL CAL: 27 mg/dL (ref 5–40)

## 2017-05-25 LAB — C-REACTIVE PROTEIN: CRP: 0.4 mg/L (ref 0.0–4.9)

## 2017-05-25 LAB — SEDIMENTATION RATE: Sed Rate: 6 mm/hr (ref 0–40)

## 2017-05-25 NOTE — Progress Notes (Signed)
Patient initially seen here for symptoms concerning for TMJ.  Labs were obtained to rule out any remote possibility of GCA, though clinical suspicion was not high.  She actually had a negative/normal CRP and ESR.  Given lack of substantial symptoms and negative labs, GCA very unlikely.  However, she did have a positive Kayleeann and has known polyarthralgia/ polymyalgia.  I wonder if she has an underlying autoimmune disease and has been possibly inappropriately diagnosed with fibromyalgia/osteoarthritis.  I have placed a referral to rheumatology for further evaluation.  Results reviewed with patient.  She is aware of referral.  Patient to follow-up with PCP for ongoing care.  Results for orders placed or performed in visit on 05/24/17 (from the past 24 hour(s))  TSH     Status: None   Collection Time: 05/24/17  3:50 PM  Result Value Ref Range   TSH 3.230 0.450 - 4.500 uIU/mL   Narrative   Performed at:  [facility] [address] [place], [place]  [id number] Lab Director: Rush Farmer MD, Phone:  [phone]  [place]     Status: Abnormal   Collection Time: 05/24/17  3:50 PM  Result Value Ref Range   Anit Nuclear Antibody([place]) Positive (A) Negative   Narrative   Performed at:  [facility] [address] [place], [place]  [id number] Lab Director: Rush Farmer MD, Phone:  [phone]  C-reactive protein     Status: None   Collection Time: 05/24/17  3:50 PM  Result Value Ref Range   CRP 0.4 0.0 - 4.9 mg/L   Narrative   Performed at:  [facility] [address], [place], [place]  [id number] Lab Director: Rush Farmer MD, Phone:  [phone]  Sedimentation Rate     Status: None   Collection Time: 05/24/17  3:50 PM  Result Value Ref Range   Sed Rate 6 0 - 40 mm/hr   Narrative   Performed at:  [facility] [address], [place], [place]  [id number] Lab Director: Rush Farmer MD, Phone:  [phone]  Basic Metabolic Panel     Status: Abnormal   Collection Time: 05/24/17  3:50 PM  Result Value Ref Range   Glucose 101 (H) 65 - 99 mg/dL   BUN 15 8 - 27 mg/dL   Creatinine, Ser 0.81 0.57 - 1.00 mg/dL   GFR calc non Af Amer 69 >59 mL/min/1.73   GFR calc Af Amer 80 >59 mL/min/1.73   BUN/Creatinine Ratio 19 12 - 28   Sodium 140 134 - 144 mmol/L   Potassium 4.9 3.5 - 5.2 mmol/L   Chloride 102 96 - 106 mmol/L   CO2 22 20 - 29 mmol/L   Calcium 9.2 8.7 - 10.3 mg/dL   Narrative   Performed at:  [address], [place], [place]  [id number] Lab Director: Rush Farmer MD, Phone:  [phone]  CBC with Differential     Status: None   Collection Time: 05/24/17  3:50 PM  Result Value Ref Range   WBC 7.5 3.4 - 10.8 x10E3/uL   RBC 4.50 3.77 - 5.28 x10E6/uL   Hemoglobin 13.5 11.1 - 15.9 g/dL   Hematocrit 40.0 34.0 - 46.6 %   MCV 89 79 - 97 fL   MCH 30.0 26.6 - 33.0 pg   MCHC 33.8 31.5 - 35.7 g/dL   RDW 13.5 12.3 - 15.4 %   Platelets 227 150 - 379 x10E3/uL   Neutrophils 51 Not Estab. %  Lymphs 36 Not Estab. %   Monocytes 8 Not Estab. %   Eos 4 Not Estab. %   Basos 1 Not Estab. %   Neutrophils Absolute 3.9 1.4 - 7.0 x10E3/uL   Lymphocytes Absolute 2.7 0.7 - 3.1 x10E3/uL   Monocytes Absolute 0.6 0.1 - 0.9 x10E3/uL   EOS (ABSOLUTE) 0.3 0.0 - 0.4 x10E3/uL   Basophils Absolute 0.1 0.0 - 0.2 x10E3/uL   Immature Granulocytes 0 Not Estab. %   Immature Grans (Abs) 0.0 0.0 - 0.1 x10E3/uL   Narrative   Performed at:  [address], [place], [place]  [id number] Lab Director: Rush Farmer MD, Phone:  [phone]     Orders Placed This Encounter  Procedures  . Ambulatory referral to Rheumatology

## 2017-05-27 ENCOUNTER — Ambulatory Visit: Payer: Medicare Other

## 2017-05-27 ENCOUNTER — Ambulatory Visit (HOSPITAL_COMMUNITY)
Admission: RE | Admit: 2017-05-27 | Discharge: 2017-05-27 | Disposition: A | Discharge status: Home / Self Care | Payer: Medicare Other | Source: Ambulatory Visit | Attending: Family Medicine | Admitting: Family Medicine

## 2017-05-27 DIAGNOSIS — M79604 Pain in right leg: Secondary | ICD-10-CM | POA: Insufficient documentation

## 2017-05-27 DIAGNOSIS — M79605 Pain in left leg: Secondary | ICD-10-CM | POA: Insufficient documentation

## 2017-05-27 DIAGNOSIS — R200 Anesthesia of skin: Secondary | ICD-10-CM | POA: Insufficient documentation

## 2017-05-27 DIAGNOSIS — R202 Paresthesia of skin: Secondary | ICD-10-CM | POA: Diagnosis not present

## 2017-05-27 NOTE — Progress Notes (Signed)
ABI's have been completed. Right 1.09 Left 1.03  05/27/17 10:15 AM [name] RVT

## 2017-06-03 ENCOUNTER — Telehealth: Payer: Self-pay | Admitting: Family Medicine

## 2017-06-04 NOTE — Telephone Encounter (Signed)
Pt was calling about results of ABI's. Have you reviewed?

## 2017-06-04 NOTE — Telephone Encounter (Signed)
Pt aware of results and appointment date/time

## 2017-06-04 NOTE — Telephone Encounter (Signed)
Yes, there was a note sent to the pool on 05/28/17 to inform her that they were negative and to follow up with Dr Livia Snellen if she is having ongoing issues.

## 2017-06-05 ENCOUNTER — Other Ambulatory Visit: Payer: Self-pay | Admitting: Family

## 2017-06-14 ENCOUNTER — Other Ambulatory Visit: Payer: Self-pay | Admitting: *Deleted

## 2017-06-14 MED ORDER — POLYETHYLENE GLYCOL 3350 17 GM/SCOOP PO POWD: 0.5000 | Freq: Every day | ORAL | 3 refills | Status: DC

## 2017-06-16 ENCOUNTER — Other Ambulatory Visit: Payer: Self-pay | Admitting: Family Medicine

## 2017-06-16 MED ORDER — POLYETHYLENE GLYCOL 3350 17 GM/SCOOP PO POWD: 17.0000 g | Freq: Every day | ORAL | 3 refills | Status: DC | PRN

## 2017-06-18 ENCOUNTER — Ambulatory Visit: Payer: Medicare Other | Admitting: Family Medicine

## 2017-06-21 ENCOUNTER — Other Ambulatory Visit: Payer: Self-pay | Admitting: *Deleted

## 2017-06-21 ENCOUNTER — Ambulatory Visit: Payer: Medicare Other | Admitting: Family Medicine

## 2017-06-21 DIAGNOSIS — H26491 Other secondary cataract, right eye: Secondary | ICD-10-CM | POA: Diagnosis not present

## 2017-06-21 DIAGNOSIS — Z961 Presence of intraocular lens: Secondary | ICD-10-CM | POA: Diagnosis not present

## 2017-06-21 DIAGNOSIS — H5211 Myopia, right eye: Secondary | ICD-10-CM | POA: Diagnosis not present

## 2017-06-21 DIAGNOSIS — H524 Presbyopia: Secondary | ICD-10-CM | POA: Diagnosis not present

## 2017-06-21 MED ORDER — ROSUVASTATIN CALCIUM 5 MG PO TABS: 5.0000 mg | ORAL_TABLET | Freq: Every day | ORAL | 0 refills | Status: DC

## 2017-06-25 NOTE — Progress Notes (Signed)
Office Visit Note  Patient: [name]             Date of Birth: [birth year]           MRN: [id number]             PCP: [name], DO Referring: [name], DO Visit Date: 07/08/2017 Occupation: @GUAROCC @    Subjective:  Pain in multiple joints   History of Present Illness: [name] is a 82 y.o. female seen in consultation per request of her PCP.  According to patient she was diagnosed with fibromyalgia about 30 years ago.  She continues to have generalized pain in all her muscles in all her joints.  She states she has had arthritis in her joints for a long time.  Her knee joints wrist is swollen.  She is also had discomfort in her left ankle.  She has left shoulder joint bursitis for which which she has had cortisone without much relief.  She states she does not like taking medications.  She also has neck pain and discomfort in her C-spine.  For which she has been using CBD oil and its been helpful.  She has been having discomfort in her left TMJ joint which causes headaches and discomfort.  The TMJ discomfort is started after having an upper respiratory tract infection.  She states she recently had labs done by her PCP which showed positive Jaylanie.  Patient was concerned about lupus as her daughter-in-law has lupus.  Activities of Daily Living:  Patient reports morning stiffness for 5 minutes.   Patient Reports nocturnal pain.  Difficulty dressing/grooming: Denies Difficulty climbing stairs: Reports Difficulty getting out of chair: Reports Difficulty using hands for taps, buttons, cutlery, and/or writing: Denies   Review of Systems  Constitutional: Positive for fatigue. Negative for night sweats, weight gain and weight loss.  HENT: Positive for mouth dryness. Negative for mouth sores, trouble swallowing, trouble swallowing and nose dryness.   Eyes: Positive for dryness. Negative for pain, redness and visual disturbance.  Respiratory: Negative for cough, shortness  of breath and difficulty breathing.   Cardiovascular: Positive for hypertension. Negative for chest pain, palpitations, irregular heartbeat and swelling in legs/feet.  Gastrointestinal: Negative for blood in stool, constipation and diarrhea.  Endocrine: Negative for increased urination.  Genitourinary: Negative for vaginal dryness.  Musculoskeletal: Positive for arthralgias, joint pain, joint swelling and morning stiffness. Negative for myalgias, muscle weakness, muscle tenderness and myalgias.  Skin: Negative for color change, rash, hair loss, skin tightness, ulcers and sensitivity to sunlight.  Allergic/Immunologic: Negative for susceptible to infections.  Neurological: Negative for dizziness, memory loss, night sweats and weakness.  Hematological: Negative for swollen glands.  Psychiatric/Behavioral: Positive for sleep disturbance. Negative for depressed mood. The patient is not nervous/anxious.     PMFS History:  Patient Active Problem List   Diagnosis Date Noted  . DDD (degenerative disc disease), cervical 07/08/2017  . Primary osteoarthritis of both knees 07/08/2017  . Bursitis of left shoulder 07/08/2017  . Fibromyalgia 07/08/2017  . Atherosclerosis of abdominal aorta ([place]) 06/29/2017  . Mixed hyperlipidemia 03/19/2017  . Osteoporosis 04/21/2016  . Internal hemorrhoids with other complication 84/16/6063  . GI bleed 05/30/2013  . NSAID long-term use 05/30/2013  . Rectal bleeding 05/29/2013  . Aortic stenosis, moderate 05/29/2013  . ANEMIA-UNSPECIFIED 01/13/2010  . HEMORRHOIDS, EXTERNAL 01/13/2010  . DIVERTICULOSIS-COLON 01/13/2010  . CONSTIPATION 01/13/2010  . RECTAL BLEEDING 01/13/2010  . FIBROMYALGIA 01/13/2010  . PERSONAL HISTORY OF COLONIC  POLYPS 01/13/2010  . Essential hypertension 08/28/2008  . GERD 08/28/2008  . PALPITATIONS 08/28/2008    Past Medical History:  Diagnosis Date  . Anemia   . Aortic atherosclerosis ([place]) 2017  . Aortic stenosis   . Arthritis   .  Bruises easily   . CAD (coronary artery disease) CARDIOLOGIST- DR Jenkins Rouge-- LAST VISIT IN [facility]  . Cataracts, bilateral   . Chronic headaches   . Complication of anesthesia    bad reaction to ether  . Diverticulosis of colon   . Eczema   . Fibromyalgia   . GERD (gastroesophageal reflux disease)   . H/O hypoglycemia   . Heart murmur   . Hemorrhoids    INTERNAL--  POST BANDING 02/ 2015  . History of adenomatous polyp of colon   . History of colon polyps   . History of kidney stones   . History of lower GI bleeding    SECONDARY TO INTERNAL HEMORRHOIDS 02/  2015--  RESOLVED  . Hyperlipidemia   . Hypertension   . Hypothyroidism, postsurgical   . Moderate aortic stenosis   . OSA (obstructive sleep apnea) CPAP INTOLERANT   . Osteopenia   . Osteoporosis   . Spinal stenosis, multilevel   . Spondylosis, cervical   . Tenosynovitis of wrist    LEFT    Family History  Problem Relation Age of Onset  . Cirrhosis Mother        drinking  . Other Brother        duodenal ulcer  . Heart disease Sister   . Cancer Son 74       colon  . Gallbladder disease Maternal Grandmother   . Stomach cancer Paternal Grandfather    Past Surgical History:  Procedure Laterality Date  . ANTERIOR CERVICAL DECOMP/DISCECTOMY FUSION  11-11-1999   C5 - C6  . APPENDECTOMY  AGE 7  . CARDIAC CATHETERIZATION  2008  DR [facility]   ESSENTIALLY NORMAL  . CATARACT EXTRACTION W/ INTRAOCULAR LENS  IMPLANT, BILATERAL  2012  . FLEXIBLE SIGMOIDOSCOPY N/A 06/01/2013   Procedure: FLEXIBLE SIGMOIDOSCOPY;  Surgeon: Inda Castle, MD;  Location: [facility];  Service: Endoscopy;  Laterality: N/A;  may need hemorrhoidal banding  . HEMORRHOIDECTOMY WITH HEMORRHOID BANDING  08-07-2010  . LAPAROSCOPIC CHOLECYSTECTOMY  1990  . SHOULDER ARTHROSCOPY WITH OPEN ROTATOR CUFF REPAIR AND DISTAL CLAVICLE ACROMINECTOMY Right 05/25/2012   Procedure: SHOULDER ARTHROSCOPY WITH OPEN ROTATOR CUFF REPAIR AND DISTAL CLAVICLE ACROMINECTOMY;   Surgeon: Magnus Sinning, MD;  Location: [facility];  Service: Orthopedics;  Laterality: Right;  RIGHT SHOULDER ARTHROSCOPY WITH DERBRIDEMENTOF LABRAL/BICEP TENDON, OPEN DISTAL CLAVICLE RESECTION, ANTERIOR ACROMINECTOMY ROTATOR CUFF REPAIR ANESTHESIA: GENERAL/SCALENE NERVE BLOCK  . TENOSYNOVECTOMY Left 01/04/2014   Procedure: LEFT WRIST EXTENSOR TENOSYNOVECTOMY;  Surgeon: Linna Hoff, MD;  Location[facility];  Service: Orthopedics;  Laterality: Left;  . THYROIDECTOMY  AGE 17   GOITER  . TONSILLECTOMY AND ADENOIDECTOMY  AGE 75  . TRANSTHORACIC ECHOCARDIOGRAM  last one 04-20-2013  DR [facility]    NORMAL LVSF/ EF 60-65%/ MODERATE  AV  STENOSIS WITH NO AR /  MILD LAE  . UMBILICAL HERNIA REPAIR  JUNE 2006  . VAGINAL HYSTERECTOMY  01-31-2001   ANTERIOR & POSTERIOR REPAIR/ TRANSVAGINAL BLADDER SLING   Social History   Social History Narrative  . Not on file     Objective: Vital Signs: BP (!) 145/76 (BP Location: Left Arm, Patient Position: Sitting, Cuff Size: Normal)   Pulse Marland Kitchen)  51   Resp 14   Ht 5\' 2"  (1.575 m)   Wt 163 lb 8 oz (74.2 kg)   BMI 29.90 kg/m    Physical Exam  Constitutional: She is oriented to person, place, and time. She appears well-developed and well-nourished.  HENT:  Head: Normocephalic and atraumatic.  Eyes: Conjunctivae and EOM are normal.  Neck: Normal range of motion.  Cardiovascular: Normal rate, regular rhythm and intact distal pulses.  Murmur heard. Pulmonary/Chest: Effort normal and breath sounds normal.  Abdominal: Soft. Bowel sounds are normal.  Lymphadenopathy:    She has no cervical adenopathy.  Neurological: She is alert and oriented to person, place, and time.  Skin: Skin is warm and dry. Capillary refill takes less than 2 seconds.  Psychiatric: She has a normal mood and affect. Her behavior is normal.  Nursing note and vitals reviewed.    Musculoskeletal Exam: C-spine limited range of motion.  Thoracic  lumbar spine some discomfort range of motion.  She had discomfort range of motion of her shoulder joints.  She had right shoulder joint rotator cuff Tear in the past.  She also had recent injection for left shoulder joint bursitis.  Elbow joints are good range of motion.  She has DIP PIP and CMC thickening in her hands consistent with osteoarthritis.  Hip joints and knee joints were in good range of motion.  She has warmth and swelling in her right knee joint.  She also has some discomfort were ankle joint without any swelling.  She has generalized hyperalgesia and positive tender points.  CDAI Exam: No CDAI exam completed.    Investigation: No additional findings.   Component     Latest Ref Rng & Units 05/24/2017  Anit Nuclear Antibody([place])     Negative Positive (A)  CRP     0.0 - 4.9 mg/L 0.4  Sed Rate     0 - 40 mm/hr 6   CBC Latest Ref Rng & Units 05/24/2017 03/19/2017 04/24/2016  WBC 3.4 - 10.8 x10E3/uL 7.5 7.9 6.7  Hemoglobin 11.1 - 15.9 g/dL 13.5 13.4 14.0  Hematocrit 34.0 - 46.6 % 40.0 39.2 40.7  Platelets 150 - 379 x10E3/uL 227 230 264   CMP Latest Ref Rng & Units 05/24/2017 03/19/2017 02/16/2017  Glucose 65 - 99 mg/dL 101(H) 100(H) -  BUN 8 - 27 mg/dL 15 12 -  Creatinine 0.57 - 1.00 mg/dL 0.81 0.75 -  Sodium 134 - 144 mmol/L 140 141 -  Potassium 3.5 - 5.2 mmol/L 4.9 4.2 -  Chloride 96 - 106 mmol/L 102 100 -  CO2 20 - 29 mmol/L 22 31(H) -  Calcium 8.7 - 10.3 mg/dL 9.2 9.5 -  Total Protein 6.0 - 8.5 g/dL - 6.5 6.8  Total Bilirubin 0.0 - 1.2 mg/dL - 0.3 0.2  Alkaline Phos 39 - 117 IU/L - 53 67  AST 0 - 40 IU/L - 21 16  ALT 0 - 32 IU/L - 17 13    Imaging: Xr Hand 2 View Left  Result Date: 07/08/2017 Mild PIP and DIP narrowing was noted.  No MCP or intercarpal radiocarpal joint space changes were noted.  No erosive changes were noted.  No juxta-articular osteopenia was noted. Impression: These findings are consistent with osteoarthritis of the hand.  Xr Hand 2 View  Right  Result Date: 07/08/2017 Mild PIP and DIP narrowing was noted.  Third MCP narrowing was noted, none of the other MCPs or intercarpal radiocarpal joint space changes were noted.  No erosive  changes were noted.  No juxta-articular osteopenia was noted. Impression: These findings are consistent with osteoarthritis of the hand.  Xr Knee 3 View Left  Result Date: 07/08/2017 Moderate medial compartment narrowing with intercondylar osteophytes was noted.  It was noted.  Moderate patellofemoral narrowing was noted. Impression: Moderate osteoarthritis and moderate chondromalacia patella of the knee joint.  Xr Knee 3 View Right  Result Date: 07/08/2017 Severe lateral compartment narrowing with lateral osteophytes and intercondylar osteophytes was noted.  Moderate patellofemoral narrowing was noted. Impression: These findings are consistent with severe osteoarthritis of the knee joint and moderate chondromalacia patella.   Speciality Comments: No specialty comments available.    Procedures:  No procedures performed Allergies: Tape   Assessment / Plan:     Visit Diagnoses: Positive Destini (antinuclear antibody) - No titer -patient has no clinical features of lupus.  She is very much concerned that she may have lupus as her daughter-in-law has lupus.  We had detailed discussion regarding different features of lupus.  To complete the workup I will obtain following labs today.  She gives history of intermittent swelling in her joints which I do not see today.  Plan: Haelie, Anti-scleroderma antibody, RNP Antibody, Anti-Smith antibody, Sjogrens syndrome-A extractable nuclear antibody, Sjogrens syndrome-B extractable nuclear antibody, Anti-DNA antibody, double-stranded, C3 and C4  Fibromyalgia: She continues to have some generalized pain and discomfort from fibromyalgia.  Bursitis of left shoulder: She has chronic pain.  She has an adequate response to the cortisone injection.  Pain in both hands - Plan:  XR Hand 2 View Right, XR Hand 2 View Left, Uric acid, Cyclic citrul peptide antibody, IgG, Rheumatoid factor.  The x-rays were consistent with osteoarthritis of bilateral hands.  Primary osteoarthritis of both knees - s/p cortisone and visco injections -patient has been followed by orthopedics.  Plan: XR KNEE 3 VIEW RIGHT, XR KNEE 3 VIEW LEFT.  X-rays showed severe osteoarthritis in the right knee joint, moderate osteoarthritis in the left knee joint and bilateral chondromalacia patella.  DDD (degenerative disc disease), cervical - Status post fusion: Limited range of motion.  Age-related osteoporosis without current pathological fracture - On Prolia  Other medical problems are listed as follows:  Essential hypertension  History of diverticulosis  History of gastroesophageal reflux (GERD)  History of anemia  History of kidney stones  Aortic stenosis, moderate  History of coronary artery disease  Eczema, unspecified type    Orders: Orders Placed This Encounter  Procedures  . XR Hand 2 View Right  . XR Hand 2 View Left  . XR KNEE 3 VIEW RIGHT  . XR KNEE 3 VIEW LEFT  . [place]  . Anti-scleroderma antibody  . RNP Antibody  . Anti-Smith antibody  . Sjogrens syndrome-A extractable nuclear antibody  . Sjogrens syndrome-B extractable nuclear antibody  . Anti-DNA antibody, double-stranded  . C3 and C4  . Uric acid  . Cyclic citrul peptide antibody, IgG  . Rheumatoid factor   No orders of the defined types were placed in this encounter.   Face-to-face time spent with patient was 30 minutes.  Greater than 50% of time was spent in counseling and coordination of care.  Follow-Up Instructions: No follow-ups on file.   Bo Merino, MD  Note - This record has been created using Editor, commissioning.  Chart creation errors have been sought, but may not always  have been located. Such creation errors do not reflect on  the standard of medical care.

## 2017-06-29 ENCOUNTER — Encounter: Payer: Self-pay | Admitting: Family Medicine

## 2017-06-29 ENCOUNTER — Ambulatory Visit (INDEPENDENT_AMBULATORY_CARE_PROVIDER_SITE_OTHER): Payer: Medicare Other | Admitting: Family Medicine

## 2017-06-29 VITALS — BP 127/59 | HR 68 | Temp 97.2°F | Ht 62.0 in | Wt 158.0 lb

## 2017-06-29 DIAGNOSIS — I700 Atherosclerosis of aorta: Secondary | ICD-10-CM

## 2017-06-29 DIAGNOSIS — I10 Essential (primary) hypertension: Secondary | ICD-10-CM | POA: Diagnosis not present

## 2017-06-29 DIAGNOSIS — E782 Mixed hyperlipidemia: Secondary | ICD-10-CM | POA: Diagnosis not present

## 2017-06-29 MED ORDER — METOPROLOL TARTRATE 50 MG PO TABS: 50.0000 mg | ORAL_TABLET | Freq: Two times a day (BID) | ORAL | 1 refills | Status: DC

## 2017-06-29 MED ORDER — ROSUVASTATIN CALCIUM 5 MG PO TABS: 5.0000 mg | ORAL_TABLET | Freq: Every day | ORAL | 1 refills | Status: DC

## 2017-06-29 NOTE — Assessment & Plan Note (Signed)
Blood pressure at goal.  Continue metoprolol.  This is been prescribed for the next 6 months.  Continue to follow-up with cardiology as scheduled.  Copy of ABIs provided to the patient.

## 2017-06-29 NOTE — Progress Notes (Signed)
Subjective: CC: establish care with new doctor, HTN/HLD HPI: [name] is a 82 y.o. female presenting to clinic today for:  1. Hypertension/ Hyperlipidemia Patient reports that she is  Compliant with Metoprolol.  She notes that she recently had a visit with her cardiologist, who said that she was doing well.  She has known coronary artery disease and had aortic atherosclerosis demonstrated on a CT in 2017.  She is currently on Crestor.  Lipid profile was recently obtained in February.  She notes that she does not exercise regularly because of polymyalgia and polyarthralgia.  However, she does try to be as physically active as possible and works frequently around the house.  She monitors her diet.  Denies headache, dizziness, visual changes, nausea, vomiting, chest pain, LE swelling, abdominal pain or shortness of breath.   ROS: Per HPI  Past Medical History:  Diagnosis Date  . Anemia   . Aortic atherosclerosis ([place]) 2017  . Aortic stenosis   . Arthritis   . Bruises easily   . CAD (coronary artery disease) CARDIOLOGIST- DR Jenkins Rouge-- LAST VISIT IN [facility]  . Cataracts, bilateral   . Chronic headaches   . Complication of anesthesia    bad reaction to ether  . Diverticulosis of colon   . Eczema   . Fibromyalgia   . GERD (gastroesophageal reflux disease)   . H/O hypoglycemia   . Heart murmur   . Hemorrhoids    INTERNAL--  POST BANDING 02/ 2015  . History of adenomatous polyp of colon   . History of colon polyps   . History of kidney stones   . History of lower GI bleeding    SECONDARY TO INTERNAL HEMORRHOIDS 02/  2015--  RESOLVED  . Hyperlipidemia   . Hypertension   . Hypothyroidism, postsurgical   . Moderate aortic stenosis   . OSA (obstructive sleep apnea) CPAP INTOLERANT   . Osteopenia   . Osteoporosis   . Spinal stenosis, multilevel   . Spondylosis, cervical   . Tenosynovitis of wrist    LEFT   Allergies  Allergen Reactions  . Tape Other (See Comments)   Dermatitis rash "with extended exposure"    Current Outpatient Medications:  .  Aloe Vera 25 MG CAPS, Take 1 capsule by mouth daily., Disp: , Rfl:  .  ascorbic acid (VITAMIN C) 1000 MG tablet, Take 1,000 mg by mouth daily., Disp: , Rfl:  .  aspirin 81 MG tablet, Take 81 mg by mouth daily. , Disp: , Rfl:  .  BEE POLLEN PO, Take 400 mg by mouth daily., Disp: , Rfl:  .  clobetasol cream (TEMOVATE) 7.82 %, Apply 1 application topically 2 (two) times daily as needed., Disp: 30 g, Rfl: 3 .  Coenzyme Q10 (CO Q 10) 100 MG CAPS, Take 1 capsule by mouth daily., Disp: , Rfl:  .  meloxicam (MOBIC) 15 MG tablet, Take 15 mg by mouth daily., Disp: , Rfl:  .  metoprolol tartrate (LOPRESSOR) 50 MG tablet, Take 1 tablet (50 mg total) by mouth 2 (two) times daily., Disp: 180 tablet, Rfl: 1 .  Misc Natural Products (TUMERSAID) TABS, Take 75 mg by mouth daily., Disp: , Rfl:  .  rosuvastatin (CRESTOR) 5 MG tablet, Take 1 tablet (5 mg total) by mouth daily., Disp: 90 tablet, Rfl: 0 .  denosumab (PROLIA) 60 MG/ML SOLN injection, Inject 60 mg into the skin every 6 (six) months. Administer in upper arm, thigh, or abdomen (Patient not taking: Reported on  06/29/2017), Disp: 1 mL, Rfl: 0 Social History   Socioeconomic History  . Marital status: Married    Spouse name: Not on file  . Number of children: 3  . Years of education: Not on file  . Highest education level: Not on file  Social Needs  . Financial resource strain: Not on file  . Food insecurity - worry: Not on file  . Food insecurity - inability: Not on file  . Transportation needs - medical: Not on file  . Transportation needs - non-medical: Not on file  Occupational History  . Not on file  Tobacco Use  . Smoking status: Former Smoker    Packs/day: 0.25    Years: 5.00    Pack years: 1.25    Types: Cigarettes    Last attempt to quit: 04/14/2003    Years since quitting: 14.2  . Smokeless tobacco: Never Used  Substance and Sexual Activity  . Alcohol  use: Yes    Comment: once in a while-social  . Drug use: No  . Sexual activity: Not Currently  Other Topics Concern  . Not on file  Social History Narrative  . Not on file   Family History  Problem Relation Age of Onset  . Cirrhosis Mother        drinking  . Other Brother        duodenal ulcer  . Heart disease Sister   . Cancer Son 28       colon  . Gallbladder disease Maternal Grandmother   . Stomach cancer Paternal Grandfather     Health Maintenance: n/a  Objective: Office vital signs reviewed. BP (!) 127/59   Pulse 68   Temp (!) 97.2 F (36.2 C) (Oral)   Ht 5\' 2"  (1.575 m)   Wt 158 lb (71.7 kg)   BMI 28.90 kg/m   Physical Examination:  General: Awake, alert, well nourished, No acute distress HEENT: Normal    Neck: No masses palpated. No lymphadenopathy; no JVD    Eyes: PERRLA, extraocular movement in tact, sclera white Cardio: regular rate and rhythm, S1S2 heard, blowing systolic murmur appreciated at bilateral sternal borders.  No radiation to carotids noted. Pulm: clear to auscultation bilaterally, no wheezes, rhonchi or rales; normal work of breathing on room air Extremities: Mild left lower extremity swelling to ankle. (Patient notes that this is chronic.)  Assessment/ Plan: 82 y.o. female   Essential hypertension Blood pressure at goal.  Continue metoprolol.  This is been prescribed for the next 6 months.  Continue to follow-up with cardiology as scheduled.  Copy of ABIs provided to the patient.  Mixed hyperlipidemia Good tolerance of Crestor 5mg .  It appears that this was lowered from 20mg  by her previous PCP earlier this year, as it was thought to be perhaps causing her polymyalgia.  She was recently noted to have a positive Halei.  Will await rheumatology's assessment.  Pending their evaluation, I would favor increasing Crestor back to 20mg  daily vs changing to Livalo, as her last lipid demonstrated an LDL of 71.  Her LDL goal <70 per cardiology. She has  known CAD and Aortic atherosclerosis, seen on CT performed in 2017. Crestor refilled for the next 6 months.  Atherosclerosis of abdominal aorta ([place]) Noted on CT scan in 2017.  She is currently on Crestor and metoprolol.  She is followed by cardiology.   [name], DO [place] [phone]

## 2017-06-29 NOTE — Assessment & Plan Note (Signed)
Noted on CT scan in 2017.  She is currently on Crestor and metoprolol.  She is followed by cardiology.

## 2017-06-29 NOTE — Assessment & Plan Note (Addendum)
Good tolerance of Crestor 5mg .  It appears that this was lowered from 20mg  by her previous PCP earlier this year, as it was thought to be perhaps causing her polymyalgia.  She was recently noted to have a positive Jesenya.  Will await rheumatology's assessment.  Pending their evaluation, I would favor increasing Crestor back to 20mg  daily vs changing to Livalo, as her last lipid demonstrated an LDL of 71.  Her LDL goal <70 per cardiology. She has known CAD and Aortic atherosclerosis, seen on CT performed in 2017. Crestor refilled for the next 6 months.

## 2017-07-08 ENCOUNTER — Encounter: Payer: Self-pay | Admitting: Rheumatology

## 2017-07-08 ENCOUNTER — Ambulatory Visit (INDEPENDENT_AMBULATORY_CARE_PROVIDER_SITE_OTHER): Payer: Self-pay

## 2017-07-08 ENCOUNTER — Ambulatory Visit: Payer: Medicare Other | Admitting: Rheumatology

## 2017-07-08 VITALS — BP 145/76 | HR 51 | Resp 14 | Ht 62.0 in | Wt 163.5 lb

## 2017-07-08 DIAGNOSIS — M170 Bilateral primary osteoarthritis of knee: Secondary | ICD-10-CM

## 2017-07-08 DIAGNOSIS — M5030 Other cervical disc degeneration, unspecified cervical region: Secondary | ICD-10-CM

## 2017-07-08 DIAGNOSIS — M797 Fibromyalgia: Secondary | ICD-10-CM

## 2017-07-08 DIAGNOSIS — M7552 Bursitis of left shoulder: Secondary | ICD-10-CM | POA: Diagnosis not present

## 2017-07-08 DIAGNOSIS — I10 Essential (primary) hypertension: Secondary | ICD-10-CM

## 2017-07-08 DIAGNOSIS — M79642 Pain in left hand: Secondary | ICD-10-CM

## 2017-07-08 DIAGNOSIS — I350 Nonrheumatic aortic (valve) stenosis: Secondary | ICD-10-CM | POA: Diagnosis not present

## 2017-07-08 DIAGNOSIS — Z8679 Personal history of other diseases of the circulatory system: Secondary | ICD-10-CM

## 2017-07-08 DIAGNOSIS — M79641 Pain in right hand: Secondary | ICD-10-CM | POA: Diagnosis not present

## 2017-07-08 DIAGNOSIS — M810 Age-related osteoporosis without current pathological fracture: Secondary | ICD-10-CM

## 2017-07-08 DIAGNOSIS — Z8719 Personal history of other diseases of the digestive system: Secondary | ICD-10-CM

## 2017-07-08 DIAGNOSIS — R768 Other specified abnormal immunological findings in serum: Secondary | ICD-10-CM

## 2017-07-08 DIAGNOSIS — Z87442 Personal history of urinary calculi: Secondary | ICD-10-CM | POA: Diagnosis not present

## 2017-07-08 DIAGNOSIS — Z862 Personal history of diseases of the blood and blood-forming organs and certain disorders involving the immune mechanism: Secondary | ICD-10-CM

## 2017-07-08 DIAGNOSIS — R7689 Other specified abnormal immunological findings in serum: Secondary | ICD-10-CM

## 2017-07-08 DIAGNOSIS — L309 Dermatitis, unspecified: Secondary | ICD-10-CM

## 2017-07-09 ENCOUNTER — Other Ambulatory Visit: Payer: Self-pay | Admitting: *Deleted

## 2017-07-09 MED ORDER — DENOSUMAB 60 MG/ML ~~LOC~~ SOLN: 60.0000 mg | SUBCUTANEOUS | 1 refills | Status: DC

## 2017-07-09 NOTE — Progress Notes (Signed)
Prolia approved with insurance RX sent into [facility]

## 2017-07-09 NOTE — Progress Notes (Signed)
We will discuss these results at the follow-up visit.

## 2017-07-11 LAB — RNP ANTIBODY: RIBONUCLEIC PROTEIN(ENA) ANTIBODY, IGG: POSITIVE AI — AB

## 2017-07-11 LAB — ANA: Anti Nuclear Antibody(ANA): NEGATIVE

## 2017-07-11 LAB — RHEUMATOID FACTOR: Rhuematoid fact SerPl-aCnc: <14 IU/mL (ref <14)

## 2017-07-11 LAB — ANTI-SMITH ANTIBODY: ENA SM AB SER-ACNC: NEGATIVE AI

## 2017-07-11 LAB — CYCLIC CITRUL PEPTIDE ANTIBODY, IGG: Cyclic Citrullin Peptide Ab: <16 UNITS

## 2017-07-11 LAB — ANTI-DNA ANTIBODY, DOUBLE-STRANDED

## 2017-07-11 LAB — ANTI-SCLERODERMA ANTIBODY: SCLERODERMA (SCL-70) (ENA) ANTIBODY, IGG: NEGATIVE AI

## 2017-07-11 LAB — SJOGRENS SYNDROME-B EXTRACTABLE NUCLEAR ANTIBODY: SSB (LA) (ENA) ANTIBODY, IGG: NEGATIVE AI

## 2017-07-11 LAB — C3 AND C4
C3 Complement: 141 mg/dL (ref 83–193)
C4 Complement: 36 mg/dL (ref 15–57)

## 2017-07-11 LAB — URIC ACID: Uric Acid, Serum: 3.9 mg/dL (ref 2.5–7.0)

## 2017-07-11 LAB — SJOGRENS SYNDROME-A EXTRACTABLE NUCLEAR ANTIBODY: SSA (Ro) (ENA) Antibody, IgG: <1 AI

## 2017-07-13 ENCOUNTER — Telehealth: Payer: Self-pay | Admitting: Family Medicine

## 2017-07-13 NOTE — Telephone Encounter (Signed)
Pt notified she will need to get results from Dr Patrecia Pour Pt verbalizes understanding

## 2017-07-14 DIAGNOSIS — H26491 Other secondary cataract, right eye: Secondary | ICD-10-CM | POA: Diagnosis not present

## 2017-07-23 ENCOUNTER — Telehealth: Payer: Self-pay | Admitting: Family Medicine

## 2017-07-23 NOTE — Telephone Encounter (Signed)
Called pt to inform Prolia inj is here Pt will come in on Monday for inj

## 2017-08-03 ENCOUNTER — Telehealth: Payer: Self-pay | Admitting: *Deleted

## 2017-08-03 NOTE — Telephone Encounter (Signed)
Call to pt to reschedule appt for Prolia inj Per pt, she does not want to continue Prolia due to side effects Per pt she has had continued joint pain and jaw pain since starting Prolia

## 2017-08-03 NOTE — Telephone Encounter (Signed)
Hmm. That is unfortunate.  We certainly need to continue something.  She had a -3 T score on last Dexa.  Might consider Forteo.  Have pt schedule follow up with me in the next few weeks to discuss further.

## 2017-08-05 ENCOUNTER — Encounter: Payer: Self-pay | Admitting: Family Medicine

## 2017-08-05 ENCOUNTER — Ambulatory Visit (INDEPENDENT_AMBULATORY_CARE_PROVIDER_SITE_OTHER): Payer: Medicare Other | Admitting: Family Medicine

## 2017-08-05 VITALS — BP 139/79 | HR 79 | Temp 97.8°F | Ht 62.0 in | Wt 157.0 lb

## 2017-08-05 DIAGNOSIS — M810 Age-related osteoporosis without current pathological fracture: Secondary | ICD-10-CM | POA: Diagnosis not present

## 2017-08-05 MED ORDER — ALENDRONATE SODIUM 70 MG PO TABS: 70.0000 mg | ORAL_TABLET | ORAL | 11 refills | Status: DC

## 2017-08-05 NOTE — Assessment & Plan Note (Signed)
I reviewed her previous 2 DEXA scan results with her.  There seems to be a noticeable change between her 2016 and 2018 DEXAs.  She wishes to discontinue Prolia and I think that this is appropriate given side effects.  We discussed alternatives, including Forteo and bisphosphonates.  Patient wishes to proceed with a once weekly bisphosphonate.  Fosamax 70 mg q. 7 days prescribed.  Side effects reviewed with patient.  Instructions for use were discussed in detail.  Handout was provided with regards to calcium and vitamin D rich foods.  I encouraged patient to continue oral supplementation but also focus heavily on diet and strength training to maintain and build bone.  Patient is due for next DEXA scan in 2020.  She will follow-up in the next 1 to 3 months with regards to osteoporosis.

## 2017-08-05 NOTE — Progress Notes (Signed)
Subjective: CC: osteoporosis HPI: [name] is a 82 y.o. female presenting to clinic today for:  1. Osteoporosis Patient had a DEXA scan in August 2018 which noted a T score of -3.6.  Prior to 2018, patient had not been on any osteoporosis medications except for calcium and vitamin D replacement.  She attributes many of her jaw and bone pain to Prolia.  She notes improvement in symptoms since she has allowed Prolia to lapse.  She currently takes 600 mg of calcium and vitamin D daily.  She tries to incorporate as much of the calcium and vitamin D as she can through her diet.  She is wondering what she can do to improve her bones and would like to switch medications if possible.  Past medical history is significant for acid reflux but she is not on PPI at this time.  It is typically diet controlled.  No difficulty with swallowing.  ROS: Per HPI  Past Medical History:  Diagnosis Date  . Anemia   . Aortic atherosclerosis ([place]) 2017  . Aortic stenosis   . Arthritis   . Bruises easily   . CAD (coronary artery disease) CARDIOLOGIST- DR Jenkins Rouge-- LAST VISIT IN [facility]  . Cataracts, bilateral   . Chronic headaches   . Complication of anesthesia    bad reaction to ether  . Diverticulosis of colon   . Eczema   . Fibromyalgia   . GERD (gastroesophageal reflux disease)   . H/O hypoglycemia   . Heart murmur   . Hemorrhoids    INTERNAL--  POST BANDING 02/ 2015  . History of adenomatous polyp of colon   . History of colon polyps   . History of kidney stones   . History of lower GI bleeding    SECONDARY TO INTERNAL HEMORRHOIDS 02/  2015--  RESOLVED  . Hyperlipidemia   . Hypertension   . Hypothyroidism, postsurgical   . Moderate aortic stenosis   . OSA (obstructive sleep apnea) CPAP INTOLERANT   . Osteopenia   . Osteoporosis   . Spinal stenosis, multilevel   . Spondylosis, cervical   . Tenosynovitis of wrist    LEFT   Allergies  Allergen Reactions  . Tape Other (See Comments)      Dermatitis rash "with extended exposure"    Current Outpatient Medications:  .  Aloe Vera 25 MG CAPS, Take 1 capsule by mouth daily., Disp: , Rfl:  .  ascorbic acid (VITAMIN C) 1000 MG tablet, Take 1,000 mg by mouth daily., Disp: , Rfl:  .  aspirin 81 MG tablet, Take 81 mg by mouth daily. , Disp: , Rfl:  .  BEE POLLEN PO, Take 400 mg by mouth daily., Disp: , Rfl:  .  Cholecalciferol (VITAMIN D3) 2000 units TABS, Take by mouth daily., Disp: , Rfl:  .  clobetasol cream (TEMOVATE) 4.00 %, Apply 1 application topically 2 (two) times daily as needed., Disp: 30 g, Rfl: 3 .  Coenzyme Q10 (CO Q 10) 100 MG CAPS, Take 1 capsule by mouth daily., Disp: , Rfl:  .  denosumab (PROLIA) 60 MG/ML SOLN injection, Inject 60 mg into the skin every 6 (six) months. Administer in upper arm, thigh, or abdomen, Disp: 1 mL, Rfl: 1 .  Digestive Enzymes (PAPAYA ENZYME PO), Take by mouth daily., Disp: , Rfl:  .  meloxicam (MOBIC) 15 MG tablet, Take 15 mg by mouth daily., Disp: , Rfl:  .  metoprolol tartrate (LOPRESSOR) 50 MG tablet, Take 1 tablet (  50 mg total) by mouth 2 (two) times daily., Disp: 180 tablet, Rfl: 1 .  Misc Natural Products (TUMERSAID) TABS, Take 75 mg by mouth daily., Disp: , Rfl:  .  OVER THE COUNTER MEDICATION, daily., Disp: , Rfl:  .  OVER THE COUNTER MEDICATION, , Disp: , Rfl:  .  rosuvastatin (CRESTOR) 5 MG tablet, Take 1 tablet (5 mg total) by mouth daily., Disp: 90 tablet, Rfl: 1 .  vitamin B-12 (CYANOCOBALAMIN) 1000 MCG tablet, Take 1,000 mcg by mouth daily., Disp: , Rfl:  Social History   Socioeconomic History  . Marital status: Married    Spouse name: Not on file  . Number of children: 3  . Years of education: Not on file  . Highest education level: Not on file  Occupational History  . Not on file  Social Needs  . Financial resource strain: Not on file  . Food insecurity:    Worry: Not on file    Inability: Not on file  . Transportation needs:    Medical: Not on file     Non-medical: Not on file  Tobacco Use  . Smoking status: Former Smoker    Packs/day: 0.25    Years: 5.00    Pack years: 1.25    Types: Cigarettes    Last attempt to quit: 04/14/2003    Years since quitting: 14.3  . Smokeless tobacco: Never Used  Substance and Sexual Activity  . Alcohol use: Yes    Comment: once in a while-social  . Drug use: Never    Comment: hemp oil   . Sexual activity: Not Currently  Lifestyle  . Physical activity:    Days per week: Not on file    Minutes per session: Not on file  . Stress: Not on file  Relationships  . Social connections:    Talks on phone: Not on file    Gets together: Not on file    Attends religious service: Not on file    Active member of club or organization: Not on file    Attends meetings of clubs or organizations: Not on file    Relationship status: Not on file  . Intimate partner violence:    Fear of current or ex partner: Not on file    Emotionally abused: Not on file    Physically abused: Not on file    Forced sexual activity: Not on file  Other Topics Concern  . Not on file  Social History Narrative  . Not on file   Family History  Problem Relation Age of Onset  . Cirrhosis Mother        drinking  . Other Brother        duodenal ulcer  . Heart disease Sister   . Cancer Son 76       colon  . Gallbladder disease Maternal Grandmother   . Stomach cancer Paternal Grandfather     Objective: Office vital signs reviewed. BP 139/79   Pulse 79   Temp 97.8 F (36.6 C) (Oral)   Ht 5\' 2"  (1.575 m)   Wt 157 lb (71.2 kg)   BMI 28.72 kg/m   Physical Examination:  General: Awake, alert, well nourished, well appearing. No acute distress MSK: Ambulates independently.  Normal muscle tone.  Assessment/ Plan: 82 y.o. female   Osteoporosis I reviewed her previous 2 DEXA scan results with her.  There seems to be a noticeable change between her 2016 and 2018 DEXAs.  She wishes to discontinue Prolia and  I think that this is  appropriate given side effects.  We discussed alternatives, including Forteo and bisphosphonates.  Patient wishes to proceed with a once weekly bisphosphonate.  Fosamax 70 mg q. 7 days prescribed.  Side effects reviewed with patient.  Instructions for use were discussed in detail.  Handout was provided with regards to calcium and vitamin D rich foods.  I encouraged patient to continue oral supplementation but also focus heavily on diet and strength training to maintain and build bone.  Patient is due for next DEXA scan in 2020.  She will follow-up in the next 1 to 3 months with regards to osteoporosis.   [name], DO [place] [phone]

## 2017-08-05 NOTE — Patient Instructions (Signed)
Start Fosamax 1 pill per week.  Make sure that you take this with a FULL glass of water and DO NOT lay down for 1 hour after medication.   Osteoporosis Osteoporosis is the thinning and loss of density in the bones. Osteoporosis makes the bones more brittle, fragile, and likely to break (fracture). Over time, osteoporosis can cause the bones to become so weak that they fracture after a simple fall. The bones most likely to fracture are the bones in the hip, wrist, and spine. What are the causes? The exact cause is not known. What increases the risk? Anyone can develop osteoporosis. You may be at greater risk if you have a family history of the condition or have poor nutrition. You may also have a higher risk if you are:  Female.  19 years old or older.  A smoker.  Not physically active.  White or Asian.  Slender.  What are the signs or symptoms? A fracture might be the first sign of the disease, especially if it results from a fall or injury that would not usually cause a bone to break. Other signs and symptoms include:  Low back and neck pain.  Stooped posture.  Height loss.  How is this diagnosed? To make a diagnosis, your health care provider may:  Take a medical history.  Perform a physical exam.  Order tests, such as: ? A bone mineral density test. ? A dual-energy X-ray absorptiometry test.  How is this treated? The goal of osteoporosis treatment is to strengthen your bones to reduce your risk of a fracture. Treatment may involve:  Making lifestyle changes, such as: ? Eating a diet rich in calcium. ? Doing weight-bearing and muscle-strengthening exercises. ? Stopping tobacco use. ? Limiting alcohol intake.  Taking medicine to slow the process of bone loss or to increase bone density.  Monitoring your levels of calcium and vitamin D.  Follow these instructions at home:  Include calcium and vitamin D in your diet. Calcium is important for bone health, and  vitamin D helps the body absorb calcium.  Perform weight-bearing and muscle-strengthening exercises as directed by your health care provider.  Do not use any tobacco products, including cigarettes, chewing tobacco, and electronic cigarettes. If you need help quitting, ask your health care provider.  Limit your alcohol intake.  Take medicines only as directed by your health care provider.  Keep all follow-up visits as directed by your health care provider. This is important.  Take precautions at home to lower your risk of falling, such as: ? Keeping rooms well lit and clutter free. ? Installing safety rails on stairs. ? Using rubber mats in the bathroom and other areas that are often wet or slippery. Get help right away if: You fall or injure yourself. This information is not intended to replace advice given to you by your health care provider. Make sure you discuss any questions you have with your health care provider. Document Released: 01/07/2005 Document Revised: 09/02/2015 Document Reviewed: 09/07/2013 Elsevier Interactive Patient Education  [employer].

## 2017-08-06 DIAGNOSIS — M19042 Primary osteoarthritis, left hand: Secondary | ICD-10-CM

## 2017-08-06 DIAGNOSIS — M19041 Primary osteoarthritis, right hand: Secondary | ICD-10-CM | POA: Insufficient documentation

## 2017-08-06 DIAGNOSIS — Z840 Family history of diseases of the skin and subcutaneous tissue: Secondary | ICD-10-CM | POA: Insufficient documentation

## 2017-08-06 NOTE — Progress Notes (Signed)
Office Visit Note  Patient: [name]             Date of Birth: 1935/09/12           MRN: [id number]             PCP: [name], DO Referring: [name], MD Visit Date: 08/11/2017 Occupation: @GUAROCC @    Subjective: Joint pain.   History of Present Illness: [name] is a 82 y.o. female with history of joint pain.  She states she continues to have some discomfort in her joints but no joint swelling.  She relates her joint symptoms to Prolia injections.  She states after Prolia injection she started having joint pain, muscle pain and TMJ discomfort.  Which gradually resolved.  She decided not to take the repeat Prolia injection couple of weeks ago.  She continues to have some discomfort in her knee joints.  She has some discomfort in her C-spine.  Activities of Daily Living:  Patient reports morning stiffness for 2 minute.   Patient Denies nocturnal pain.  Difficulty dressing/grooming: Denies Difficulty climbing stairs: Reports Difficulty getting out of chair: Reports Difficulty using hands for taps, buttons, cutlery, and/or writing: Denies   Review of Systems  Constitutional: Positive for fatigue. Negative for night sweats, weight gain and weight loss.  HENT: Negative for mouth sores, trouble swallowing, trouble swallowing, mouth dryness and nose dryness.   Eyes: Negative for pain, redness, visual disturbance and dryness.  Respiratory: Negative for cough, shortness of breath and difficulty breathing.   Cardiovascular: Positive for palpitations and hypertension. Negative for chest pain, irregular heartbeat and swelling in legs/feet.  Gastrointestinal: Negative for blood in stool, constipation and diarrhea.  Endocrine: Negative for increased urination.  Genitourinary: Negative for vaginal dryness.  Musculoskeletal: Positive for arthralgias, joint pain, myalgias, morning stiffness and myalgias. Negative for joint swelling, muscle weakness and muscle tenderness.    Skin: Negative for color change, rash, hair loss, skin tightness, ulcers and sensitivity to sunlight.  Allergic/Immunologic: Negative for susceptible to infections.  Neurological: Negative for dizziness, memory loss, night sweats and weakness.  Hematological: Negative for swollen glands.  Psychiatric/Behavioral: Negative for depressed mood and sleep disturbance. The patient is not nervous/anxious.     PMFS History:  Patient Active Problem List   Diagnosis Date Noted  . Primary osteoarthritis of both hands 08/06/2017  . Family history of lupus erythematosus 08/06/2017  . DDD (degenerative disc disease), cervical 07/08/2017  . Primary osteoarthritis of both knees 07/08/2017  . Bursitis of left shoulder 07/08/2017  . Fibromyalgia 07/08/2017  . Atherosclerosis of abdominal aorta ([place]) 06/29/2017  . Mixed hyperlipidemia 03/19/2017  . Osteoporosis 04/21/2016  . Internal hemorrhoids with other complication 40/98/1191  . GI bleed 05/30/2013  . NSAID long-term use 05/30/2013  . Rectal bleeding 05/29/2013  . Aortic stenosis, moderate 05/29/2013  . ANEMIA-UNSPECIFIED 01/13/2010  . HEMORRHOIDS, EXTERNAL 01/13/2010  . DIVERTICULOSIS-COLON 01/13/2010  . CONSTIPATION 01/13/2010  . RECTAL BLEEDING 01/13/2010  . FIBROMYALGIA 01/13/2010  . PERSONAL HISTORY OF COLONIC POLYPS 01/13/2010  . Essential hypertension 08/28/2008  . GERD 08/28/2008  . PALPITATIONS 08/28/2008    Past Medical History:  Diagnosis Date  . Anemia   . Aortic atherosclerosis ([place]) 2017  . Aortic stenosis   . Arthritis   . Bruises easily   . CAD (coronary artery disease) CARDIOLOGIST- DR Jenkins Rouge-- LAST VISIT IN [facility]  . Cataracts, bilateral   . Chronic headaches   . Complication of anesthesia  bad reaction to ether  . Diverticulosis of colon   . Eczema   . Fibromyalgia   . GERD (gastroesophageal reflux disease)   . H/O hypoglycemia   . Heart murmur   . Hemorrhoids    INTERNAL--  POST BANDING 02/ 2015  .  History of adenomatous polyp of colon   . History of colon polyps   . History of kidney stones   . History of lower GI bleeding    SECONDARY TO INTERNAL HEMORRHOIDS 02/  2015--  RESOLVED  . Hyperlipidemia   . Hypertension   . Hypothyroidism, postsurgical   . Moderate aortic stenosis   . OSA (obstructive sleep apnea) CPAP INTOLERANT   . Osteopenia   . Osteoporosis   . Spinal stenosis, multilevel   . Spondylosis, cervical   . Tenosynovitis of wrist    LEFT    Family History  Problem Relation Age of Onset  . Cirrhosis Mother        drinking  . Other Brother        duodenal ulcer  . Heart disease Sister   . Cancer Son 51       colon  . Gallbladder disease Maternal Grandmother   . Stomach cancer Paternal Grandfather    Past Surgical History:  Procedure Laterality Date  . ANTERIOR CERVICAL DECOMP/DISCECTOMY FUSION  11-11-1999   C5 - C6  . APPENDECTOMY  AGE 55  . CARDIAC CATHETERIZATION  2008  DR [facility]   ESSENTIALLY NORMAL  . CATARACT EXTRACTION W/ INTRAOCULAR LENS  IMPLANT, BILATERAL  2012  . FLEXIBLE SIGMOIDOSCOPY N/A 06/01/2013   Procedure: FLEXIBLE SIGMOIDOSCOPY;  Surgeon: Inda Castle, MD;  Location: [facility];  Service: Endoscopy;  Laterality: N/A;  may need hemorrhoidal banding  . HEMORRHOIDECTOMY WITH HEMORRHOID BANDING  08-07-2010  . LAPAROSCOPIC CHOLECYSTECTOMY  1990  . SHOULDER ARTHROSCOPY WITH OPEN ROTATOR CUFF REPAIR AND DISTAL CLAVICLE ACROMINECTOMY Right 05/25/2012   Procedure: SHOULDER ARTHROSCOPY WITH OPEN ROTATOR CUFF REPAIR AND DISTAL CLAVICLE ACROMINECTOMY;  Surgeon: Magnus Sinning, MD;  Location: [facility];  Service: Orthopedics;  Laterality: Right;  RIGHT SHOULDER ARTHROSCOPY WITH DERBRIDEMENTOF LABRAL/BICEP TENDON, OPEN DISTAL CLAVICLE RESECTION, ANTERIOR ACROMINECTOMY ROTATOR CUFF REPAIR ANESTHESIA: GENERAL/SCALENE NERVE BLOCK  . TENOSYNOVECTOMY Left 01/04/2014   Procedure: LEFT WRIST EXTENSOR TENOSYNOVECTOMY;  Surgeon: Linna Hoff, MD;  Location: [facility];  Service: Orthopedics;  Laterality: Left;  . THYROIDECTOMY  AGE 80   GOITER  . TONSILLECTOMY AND ADENOIDECTOMY  AGE 7  . TRANSTHORACIC ECHOCARDIOGRAM  last one 04-20-2013  DR [facility]    NORMAL LVSF/ EF 60-65%/ MODERATE  AV  STENOSIS WITH NO AR /  MILD LAE  . UMBILICAL HERNIA REPAIR  JUNE 2006  . VAGINAL HYSTERECTOMY  01-31-2001   ANTERIOR & POSTERIOR REPAIR/ TRANSVAGINAL BLADDER SLING   Social History   Social History Narrative  . Not on file     Objective: Vital Signs: BP (!) 148/61 (BP Location: Left Arm, Patient Position: Sitting, Cuff Size: Normal)   Pulse 66   Resp 14   Ht 5\' 2"  (1.575 m)   Wt 159 lb (72.1 kg)   BMI 29.08 kg/m    Physical Exam  Constitutional: She is oriented to person, place, and time. She appears well-developed and well-nourished.  HENT:  Head: Normocephalic and atraumatic.  Eyes: Conjunctivae and EOM are normal.  Neck: Normal range of motion.  Cardiovascular: Normal rate, regular rhythm and intact distal pulses.  Murmur heard. Pulmonary/Chest: Effort normal and  breath sounds normal.  Abdominal: Soft. Bowel sounds are normal.  Lymphadenopathy:    She has no cervical adenopathy.  Neurological: She is alert and oriented to person, place, and time.  Skin: Skin is warm and dry. Capillary refill takes less than 2 seconds.  Psychiatric: She has a normal mood and affect. Her behavior is normal.  Nursing note and vitals reviewed.    Musculoskeletal Exam: C-spine thoracic lumbar spine good range of motion.  Shoulder joints elbow joints wrist joints are good range of motion.  She has DIP PIP thickening in her hands but no synovitis.  She had painful range of motion of her right knee joint without any warmth swelling or effusion.  She has left knee joint discomfort and crepitus.  All other joints full range of motion with no synovitis.  She has some generalized hyperalgesia  CDAI Exam: No CDAI exam  completed.    Investigation: No additional findings. June 30, 2017 Shakyia negative, RNP positive, (SCL 70, Smith, SSA, SSB, dsDNA negative), C3-C4 normal, anti-CCP negative, rheumatoid factor negative, uric acid 3.9  Imaging: No results found.  Speciality Comments: No specialty comments available.    Procedures:  Large Joint Inj: R knee on 08/11/2017 2:21 PM Indications: pain Details: 27 G 1.5 in needle, medial approach  Arthrogram: No  Medications: 1.5 mL lidocaine (PF) 1 %; 40 mg triamcinolone acetonide 40 MG/ML Aspirate: 0 mL Outcome: tolerated well, no immediate complications Procedure, treatment alternatives, risks and benefits explained, specific risks discussed. Consent was given by the patient. Immediately prior to procedure a time out was called to verify the correct patient, procedure, equipment, support staff and site/side marked as required. Patient was prepped and draped in the usual sterile fashion.     Allergies: Tape and Prolia [denosumab]   Assessment / Plan:     Visit Diagnoses: Positive Adraine (antinuclear antibody) - Repeat Lamica negative, RNP positive, no clinical features of lupus.  She has no synovitis on examination.  All the labs obtained last visit were discussed at length.  Family history of lupus erythematosus - Daughter  Primary osteoarthritis of both hands-joint protection muscle strengthening was discussed.  Primary osteoarthritis of both knees - Right severe, left moderate, bilateral chondromalacia patella.  She has been having increased discomfort in her right knee joint and difficulty with mobility.  I discussed the option of total knee replacement which she declined.  She has had cortisone injections in the past which do not last for long time.  But she was in a lot of discomfort and requested cortisone today.  The procedure as described above.  She would like to get Visco supplement injections.  We will schedule Euflexxa for her right knee.  DDD  (degenerative disc disease), cervical-chronic discomfort.  Age-related osteoporosis without current pathological fracture - Patient was on Prolia which she discontinued.  She believes the Prolia caused increased arthralgias and myalgias.  Fibromyalgia-she has some generalized pain and discomfort.  Other medical problems are listed as follows:  Gastroesophageal reflux disease without esophagitis  Aortic stenosis, moderate  Essential hypertension   Other medical problems are listed as follows:  Essential hypertension-her blood pressure is slightly elevated.  Have advised her to monitor her blood pressure closely especially after cortisone injection.  History of diverticulosis  History of anemia  History of kidney stones  Aortic stenosis, moderate  History of coronary artery disease  Eczema, unspecified type    Orders: Orders Placed This Encounter  Procedures  . Large Joint Inj: R knee  No orders of the defined types were placed in this encounter.   Face-to-face time spent with patient was 30 minutes. >50% of time was spent in counseling and coordination of care.  Follow-Up Instructions: Return in about 6 months (around 02/11/2018) for Osteoarthritis, +RNP.   Bo Merino, MD  Note - This record has been created using Editor, commissioning.  Chart creation errors have been sought, but may not always  have been located. Such creation errors do not reflect on  the standard of medical care.

## 2017-08-11 ENCOUNTER — Ambulatory Visit: Payer: Medicare Other | Admitting: Rheumatology

## 2017-08-11 ENCOUNTER — Encounter: Payer: Self-pay | Admitting: Rheumatology

## 2017-08-11 VITALS — BP 148/61 | HR 66 | Resp 14 | Ht 62.0 in | Wt 159.0 lb

## 2017-08-11 DIAGNOSIS — M797 Fibromyalgia: Secondary | ICD-10-CM

## 2017-08-11 DIAGNOSIS — M170 Bilateral primary osteoarthritis of knee: Secondary | ICD-10-CM | POA: Diagnosis not present

## 2017-08-11 DIAGNOSIS — Z840 Family history of diseases of the skin and subcutaneous tissue: Secondary | ICD-10-CM

## 2017-08-11 DIAGNOSIS — M19041 Primary osteoarthritis, right hand: Secondary | ICD-10-CM

## 2017-08-11 DIAGNOSIS — M810 Age-related osteoporosis without current pathological fracture: Secondary | ICD-10-CM | POA: Diagnosis not present

## 2017-08-11 DIAGNOSIS — K219 Gastro-esophageal reflux disease without esophagitis: Secondary | ICD-10-CM

## 2017-08-11 DIAGNOSIS — M19042 Primary osteoarthritis, left hand: Secondary | ICD-10-CM | POA: Diagnosis not present

## 2017-08-11 DIAGNOSIS — G8929 Other chronic pain: Secondary | ICD-10-CM | POA: Diagnosis not present

## 2017-08-11 DIAGNOSIS — M5030 Other cervical disc degeneration, unspecified cervical region: Secondary | ICD-10-CM

## 2017-08-11 DIAGNOSIS — R768 Other specified abnormal immunological findings in serum: Secondary | ICD-10-CM | POA: Diagnosis not present

## 2017-08-11 DIAGNOSIS — I350 Nonrheumatic aortic (valve) stenosis: Secondary | ICD-10-CM | POA: Diagnosis not present

## 2017-08-11 DIAGNOSIS — I10 Essential (primary) hypertension: Secondary | ICD-10-CM

## 2017-08-11 DIAGNOSIS — M25561 Pain in right knee: Secondary | ICD-10-CM | POA: Diagnosis not present

## 2017-08-11 MED ORDER — TRIAMCINOLONE ACETONIDE 40 MG/ML IJ SUSP
40.0000 mg | INTRAMUSCULAR | Status: AC | PRN
  Administered 2017-08-11: 40 mg via INTRA_ARTICULAR

## 2017-08-11 MED ORDER — LIDOCAINE HCL (PF) 1 % IJ SOLN
1.5000 mL | INTRAMUSCULAR | Status: AC | PRN
  Administered 2017-08-11: 1.5 mL

## 2017-08-11 NOTE — Patient Instructions (Signed)

## 2017-08-16 ENCOUNTER — Ambulatory Visit: Payer: Medicare Other | Admitting: Rheumatology

## 2017-08-24 ENCOUNTER — Telehealth (HOSPITAL_COMMUNITY): Payer: Self-pay | Admitting: Cardiovascular Disease

## 2017-08-24 NOTE — Telephone Encounter (Signed)
Called patient about her message. Patient is complaining about her BP has been elevated the last couple of weeks. Patient stated she has no energy, headaches, and has weakness. Patient has been taking her metoprolol 50 mg BID since 2014, PCP recently filled. Informed patient SBP is a little elevated, and her HR is good. Encouraged patient to keep taking her metoprolol as prescribed. Encouraged patient to call her PCP about her BP's and other symptoms. Will also forward to Dr. Johnsie Cancel for advisement.

## 2017-08-24 NOTE — Telephone Encounter (Signed)
Pt c/o medication issue:  1. Name of Medication: Metoprolol 50   2. How are you currently taking this medication (dosage and times per day)? BID  3. Are you having a reaction (difficulty breathing--STAT)? Elevated blood pressure  4. What is your medication issue? Patient stated on Sunday morning 08/22/17 she was experiencing headaches, palpitations, and some weakness/fatigue after breakfast. She has recorded her blood pressures for the past 3 days.  08/22/17 @ 11:00am 179/84  Pulse 64 08/23/17 @11 :43am  142/72   Pulse 86 08/24/17 @ 1:15pm   153/75  Please f/u and advise patient.

## 2017-08-25 ENCOUNTER — Ambulatory Visit (INDEPENDENT_AMBULATORY_CARE_PROVIDER_SITE_OTHER): Payer: Medicare Other | Admitting: Family Medicine

## 2017-08-25 ENCOUNTER — Encounter: Payer: Self-pay | Admitting: Family Medicine

## 2017-08-25 VITALS — BP 143/65 | HR 55 | Temp 98.5°F | Ht 62.0 in | Wt 156.0 lb

## 2017-08-25 DIAGNOSIS — I10 Essential (primary) hypertension: Secondary | ICD-10-CM | POA: Diagnosis not present

## 2017-08-25 DIAGNOSIS — R5382 Chronic fatigue, unspecified: Secondary | ICD-10-CM | POA: Diagnosis not present

## 2017-08-25 DIAGNOSIS — I350 Nonrheumatic aortic (valve) stenosis: Secondary | ICD-10-CM

## 2017-08-25 DIAGNOSIS — M810 Age-related osteoporosis without current pathological fracture: Secondary | ICD-10-CM | POA: Diagnosis not present

## 2017-08-25 NOTE — Patient Instructions (Addendum)
Monitor your blood pressures one time a day at around the same time each day and record.  Send me at least 5 days worth of blood pressures next week.  We will plan to adjust her blood pressure medications pending this.  Please follow the below instructions each time you check your blood pressure.  Start taking your alendronate for your osteoporosis.  Think about using your CPAP.  As we discussed, uncontrolled sleep apnea can cause increased fatigue and blood pressure issues.  How to Take Your Blood Pressure You can take your blood pressure at home with a machine. You may need to check your blood pressure at home:  To check if you have high blood pressure (hypertension).  To check your blood pressure over time.  To make sure your blood pressure medicine is working.  Supplies needed: You will need a blood pressure machine, or monitor. You can buy one at a drugstore or online. When choosing one:  Choose one with an arm cuff.  Choose one that wraps around your upper arm. Only one finger should fit between your arm and the cuff.  Do not choose one that measures your blood pressure from your wrist or finger.  Your doctor can suggest a monitor. How to prepare Avoid these things for 30 minutes before checking your blood pressure:  Drinking caffeine.  Drinking alcohol.  Eating.  Smoking.  Exercising.  Five minutes before checking your blood pressure:  Pee.  Sit in a dining chair. Avoid sitting in a soft couch or armchair.  Be quiet. Do not talk.  How to take your blood pressure Follow the instructions that came with your machine. If you have a digital blood pressure monitor, these may be the instructions: 1. Sit up straight. 2. Place your feet on the floor. Do not cross your ankles or legs. 3. Rest your left arm at the level of your heart. You may rest it on a table, desk, or chair. 4. Pull up your shirt sleeve. 5. Wrap the blood pressure cuff around the upper part of your  left arm. The cuff should be 1 inch (2.5 cm) above your elbow. It is best to wrap the cuff around bare skin. 6. Fit the cuff snugly around your arm. You should be able to place only one finger between the cuff and your arm. 7. Put the cord inside the groove of your elbow. 8. Press the power button. 9. Sit quietly while the cuff fills with air and loses air. 10. Write down the numbers on the screen. 11. Wait 2-3 minutes and then repeat steps 1-10.  What do the numbers mean? Two numbers make up your blood pressure. The first number is called systolic pressure. The second is called diastolic pressure. An example of a blood pressure reading is "120 over 80" (or 120/80). If you are an adult and do not have a medical condition, use this guide to find out if your blood pressure is normal: Normal  First number: below 120.  Second number: below 80. Elevated  First number: 120-129.  Second number: below 80. Hypertension stage 1  First number: 130-139.  Second number: 80-89. Hypertension stage 2  First number: 140 or above.  Second number: 50 or above. Your blood pressure is above normal even if only the top or bottom number is above normal. Follow these instructions at home:  Check your blood pressure as often as your doctor tells you to.  Take your monitor to your next doctor's appointment. Your  doctor will: ? Make sure you are using it correctly. ? Make sure it is working right.  Make sure you understand what your blood pressure numbers should be.  Tell your doctor if your medicines are causing side effects. Contact a doctor if:  Your blood pressure keeps being high. Get help right away if:  Your first blood pressure number is higher than 180.  Your second blood pressure number is higher than 120. This information is not intended to replace advice given to you by your health care provider. Make sure you discuss any questions you have with your health care  provider. Document Released: 03/12/2008 Document Revised: 02/26/2016 Document Reviewed: 09/06/2015 Elsevier Interactive Patient Education  2018 [employer].  Sleep Apnea Sleep apnea is a condition in which breathing pauses or becomes shallow during sleep. Episodes of sleep apnea usually last 10 seconds or longer, and they may occur as many as 20 times an hour. Sleep apnea disrupts your sleep and keeps your body from getting the rest that it needs. This condition can increase your risk of certain health problems, including:  Heart attack.  Stroke.  Obesity.  Diabetes.  Heart failure.  Irregular heartbeat.  There are three kinds of sleep apnea:  Obstructive sleep apnea. This kind is caused by a blocked or collapsed airway.  Central sleep apnea. This kind happens when the part of the brain that controls breathing does not send the correct signals to the muscles that control breathing.  Mixed sleep apnea. This is a combination of obstructive and central sleep apnea.  What are the causes? The most common cause of this condition is a collapsed or blocked airway. An airway can collapse or become blocked if:  Your throat muscles are abnormally relaxed.  Your tongue and tonsils are larger than normal.  You are overweight.  Your airway is smaller than normal.  What increases the risk? This condition is more likely to develop in people who:  Are overweight.  Smoke.  Have a smaller than normal airway.  Are elderly.  Are female.  Drink alcohol.  Take sedatives or tranquilizers.  Have a family history of sleep apnea.  What are the signs or symptoms? Symptoms of this condition include:  Trouble staying asleep.  Daytime sleepiness and tiredness.  Irritability.  Loud snoring.  Morning headaches.  Trouble concentrating.  Forgetfulness.  Decreased interest in sex.  Unexplained sleepiness.  Mood swings.  Personality changes.  Feelings of  depression.  Waking up often during the night to urinate.  Dry mouth.  Sore throat.  How is this diagnosed? This condition may be diagnosed with:  A medical history.  A physical exam.  A series of tests that are done while you are sleeping (sleep study). These tests are usually done in a sleep lab, but they may also be done at home.  How is this treated? Treatment for this condition aims to restore normal breathing and to ease symptoms during sleep. It may involve managing health issues that can affect breathing, such as high blood pressure or obesity. Treatment may include:  Sleeping on your side.  Using a decongestant if you have nasal congestion.  Avoiding the use of depressants, including alcohol, sedatives, and narcotics.  Losing weight if you are overweight.  Making changes to your diet.  Quitting smoking.  Using a device to open your airway while you sleep, such as: ? An oral appliance. This is a custom-made mouthpiece that shifts your lower jaw forward. ? A continuous positive  airway pressure (CPAP) device. This device delivers oxygen to your airway through a mask. ? A nasal expiratory positive airway pressure (EPAP) device. This device has valves that you put into each nostril. ? A bi-level positive airway pressure (BPAP) device. This device delivers oxygen to your airway through a mask.  Surgery if other treatments do not work. During surgery, excess tissue is removed to create a wider airway.  It is important to get treatment for sleep apnea. Without treatment, this condition can lead to:  High blood pressure.  Coronary artery disease.  (Men) An inability to achieve or maintain an erection (impotence).  Reduced thinking abilities.  Follow these instructions at home:  Make any lifestyle changes that your health care provider recommends.  Eat a healthy, well-balanced diet.  Take over-the-counter and prescription medicines only as told by your health  care provider.  Avoid using depressants, including alcohol, sedatives, and narcotics.  Take steps to lose weight if you are overweight.  If you were given a device to open your airway while you sleep, use it only as told by your health care provider.  Do not use any tobacco products, such as cigarettes, chewing tobacco, and e-cigarettes. If you need help quitting, ask your health care provider.  Keep all follow-up visits as told by your health care provider. This is important. Contact a health care provider if:  The device that you received to open your airway during sleep is uncomfortable or does not seem to be working.  Your symptoms do not improve.  Your symptoms get worse. Get help right away if:  You develop chest pain.  You develop shortness of breath.  You develop discomfort in your back, arms, or stomach.  You have trouble speaking.  You have weakness on one side of your body.  You have drooping in your face. These symptoms may represent a serious problem that is an emergency. Do not wait to see if the symptoms will go away. Get medical help right away. Call your local emergency services (911 in the [place] Do not drive yourself to the hospital. This information is not intended to replace advice given to you by your health care provider. Make sure you discuss any questions you have with your health care provider. Document Released: 03/20/2002 Document Revised: 11/24/2015 Document Reviewed: 01/07/2015 Elsevier Interactive Patient Education  [employer].

## 2017-08-25 NOTE — Telephone Encounter (Signed)
Called patient to let her know Dr. Johnsie Cancel wants her to follow up with her PCP. Patient verbalized understanding.

## 2017-08-25 NOTE — Progress Notes (Signed)
Subjective: CC: fatigue/ BP problems HPI: [name] is a 82 y.o. female presenting to clinic today for:  1. Fatigue/BP concern Patient reports that she has had several weeks of fatigue and associated heart palpitations in her throat.  She notes that she has had blood pressure fluctuations, citing that her blood pressure readings are going between stage I and stage II hypertension.  She reports compliance with her medications.  No chest pain, shortness of breath.  She does note occasional headache.  She has a past medical history of coronary artery disease, fibromyalgia and chronic headaches.  She also has a past medical history of obstructive sleep apnea, for which she does not use her CPAP machine.  2.  Osteoporosis Patient notes that she has not yet started her Fosamax.  She forgot that she was supposed be taking this.  She does plan to start.   ROS: Per HPI  Past Medical History:  Diagnosis Date  . Anemia   . Aortic atherosclerosis ([place]) 2017  . Aortic stenosis   . Arthritis   . Bruises easily   . CAD (coronary artery disease) CARDIOLOGIST- DR Jenkins [place]-- LAST VISIT IN [facility]  . Cataracts, bilateral   . Chronic headaches   . Complication of anesthesia    bad reaction to ether  . Diverticulosis of colon   . Eczema   . Fibromyalgia   . GERD (gastroesophageal reflux disease)   . H/O hypoglycemia   . Heart murmur   . Hemorrhoids    INTERNAL--  POST BANDING 02/ 2015  . History of adenomatous polyp of colon   . History of colon polyps   . History of kidney stones   . History of lower GI bleeding    SECONDARY TO INTERNAL HEMORRHOIDS 02/  2015--  RESOLVED  . Hyperlipidemia   . Hypertension   . Hypothyroidism, postsurgical   . Moderate aortic stenosis   . OSA (obstructive sleep apnea) CPAP INTOLERANT   . Osteopenia   . Osteoporosis   . Spinal stenosis, multilevel   . Spondylosis, cervical   . Tenosynovitis of wrist    LEFT   Allergies  Allergen Reactions  . Tape  Other (See Comments)    Dermatitis rash "with extended exposure"  . Prolia [Denosumab]     Arthralgia/ myalgia/ jaw pain/ headache    Current Outpatient Medications:  .  alendronate (FOSAMAX) 70 MG tablet, Take 1 tablet (70 mg total) by mouth every 7 (seven) days. Take with a full glass of water on an empty stomach., Disp: 4 tablet, Rfl: 11 .  Aloe Vera 25 MG CAPS, Take 1 capsule by mouth daily., Disp: , Rfl:  .  ascorbic acid (VITAMIN C) 1000 MG tablet, Take 1,000 mg by mouth daily., Disp: , Rfl:  .  aspirin 81 MG tablet, Take 81 mg by mouth daily. , Disp: , Rfl:  .  BEE POLLEN PO, Take 400 mg by mouth daily., Disp: , Rfl:  .  Cholecalciferol (VITAMIN D3) 2000 units TABS, Take by mouth daily., Disp: , Rfl:  .  clobetasol cream (TEMOVATE) 4.65 %, Apply 1 application topically 2 (two) times daily as needed., Disp: 30 g, Rfl: 3 .  Coenzyme Q10 (CO Q 10) 100 MG CAPS, Take 1 capsule by mouth daily., Disp: , Rfl:  .  Digestive Enzymes (PAPAYA ENZYME PO), Take by mouth daily., Disp: , Rfl:  .  meloxicam (MOBIC) 15 MG tablet, Take 15 mg by mouth as needed. , Disp: , Rfl:  .  metoprolol tartrate (LOPRESSOR) 50 MG tablet, Take 1 tablet (50 mg total) by mouth 2 (two) times daily., Disp: 180 tablet, Rfl: 1 .  Misc Natural Products (TUMERSAID) TABS, Take 75 mg by mouth daily., Disp: , Rfl:  .  OVER THE COUNTER MEDICATION, daily., Disp: , Rfl:  .  OVER THE COUNTER MEDICATION, , Disp: , Rfl:  .  rosuvastatin (CRESTOR) 5 MG tablet, Take 1 tablet (5 mg total) by mouth daily., Disp: 90 tablet, Rfl: 1 .  vitamin B-12 (CYANOCOBALAMIN) 1000 MCG tablet, Take 1,000 mcg by mouth daily., Disp: , Rfl:  Social History   Socioeconomic History  . Marital status: Married    Spouse name: Not on file  . Number of children: 3  . Years of education: Not on file  . Highest education level: Not on file  Occupational History  . Not on file  Social Needs  . Financial resource strain: Not on file  . Food insecurity:     Worry: Not on file    Inability: Not on file  . Transportation needs:    Medical: Not on file    Non-medical: Not on file  Tobacco Use  . Smoking status: Former Smoker    Packs/day: 0.25    Years: 5.00    Pack years: 1.25    Types: Cigarettes    Last attempt to quit: 04/14/2003    Years since quitting: 14.3  . Smokeless tobacco: Never Used  Substance and Sexual Activity  . Alcohol use: Yes    Comment: once in a while-social  . Drug use: Never    Comment: hemp oil   . Sexual activity: Not Currently  Lifestyle  . Physical activity:    Days per week: Not on file    Minutes per session: Not on file  . Stress: Not on file  Relationships  . Social connections:    Talks on phone: Not on file    Gets together: Not on file    Attends religious service: Not on file    Active member of club or organization: Not on file    Attends meetings of clubs or organizations: Not on file    Relationship status: Not on file  . Intimate partner violence:    Fear of current or ex partner: Not on file    Emotionally abused: Not on file    Physically abused: Not on file    Forced sexual activity: Not on file  Other Topics Concern  . Not on file  Social History Narrative  . Not on file   Family History  Problem Relation Age of Onset  . Cirrhosis Mother        drinking  . Other Brother        duodenal ulcer  . Heart disease Sister   . Cancer Son 72       colon  . Gallbladder disease Maternal Grandmother   . Stomach cancer Paternal Grandfather    Objective: Office vital signs reviewed. BP (!) 143/65   Pulse (!) 55   Temp 98.5 F (36.9 C) (Oral)   Ht 5\' 2"  (1.575 m)   Wt 156 lb (70.8 kg)   BMI 28.53 kg/m   Physical Examination:  General: Awake, alert, well nourished, No acute distress HEENT: Normal, sclera white, MMM Cardio: regular rate and rhythm, S1S2 heard, murmur noted. Pulm: clear to auscultation bilaterally, no wheezes, rhonchi or rales; normal work of breathing on room  air Extremities: warm, well perfused, No edema, cyanosis  or clubbing; +2 pulses bilaterally MSK: normal gait and normal station Skin: dry; intact; no rashes or lesions Neuro: 5/5 UE and LE Strength and light touch sensation grossly intact  Assessment/ Plan: 82 y.o. female   1. Essential hypertension Blood pressure not especially elevated for age today.  I did recommend that she monitor her blood pressures at least once daily around the same time every day.  Method for blood pressure measuring reviewed with the patient and handout provided.  She will contact me with 5 days of blood pressure readings we will discuss titrating her blood pressure regimen pending these readings.  2. Aortic stenosis, moderate Murmur appreciated on exam.  She has appointment with cardiology coming up soon.  Plan for repeat echo at the end of the month as well.  3. Age-related osteoporosis without current pathological fracture Patient is start Fosamax as directed.  She will follow-up with me as needed.  4. Chronic fatigue Possibly related to untreated OSA versus multiple other chronic conditions.  Would definitely like to rule out any heart etiology.  I did recommend that she keep her cardiology appointment and echo appointment.   [name], DO [place] [phone]

## 2017-08-25 NOTE — Progress Notes (Deleted)
Subjective: CC: fatigue/ BP problems HPI: [name] is a 82 y.o. female presenting to clinic today for:  1. Fatigue ***  2. BP concern ***  ROS: Per HPI  Past Medical History:  Diagnosis Date  . Anemia   . Aortic atherosclerosis ([name]) 2017  . Aortic stenosis   . Arthritis   . Bruises easily   . CAD (coronary artery disease) CARDIOLOGIST- DR Jenkins [place]-- LAST VISIT IN [facility]  . Cataracts, bilateral   . Chronic headaches   . Complication of anesthesia    bad reaction to ether  . Diverticulosis of colon   . Eczema   . Fibromyalgia   . GERD (gastroesophageal reflux disease)   . H/O hypoglycemia   . Heart murmur   . Hemorrhoids    INTERNAL--  POST BANDING 02/ 2015  . History of adenomatous polyp of colon   . History of colon polyps   . History of kidney stones   . History of lower GI bleeding    SECONDARY TO INTERNAL HEMORRHOIDS 02/  2015--  RESOLVED  . Hyperlipidemia   . Hypertension   . Hypothyroidism, postsurgical   . Moderate aortic stenosis   . OSA (obstructive sleep apnea) CPAP INTOLERANT   . Osteopenia   . Osteoporosis   . Spinal stenosis, multilevel   . Spondylosis, cervical   . Tenosynovitis of wrist    LEFT   Allergies  Allergen Reactions  . Tape Other (See Comments)    Dermatitis rash "with extended exposure"  . Prolia [Denosumab]     Arthralgia/ myalgia/ jaw pain/ headache    Current Outpatient Medications:  .  alendronate (FOSAMAX) 70 MG tablet, Take 1 tablet (70 mg total) by mouth every 7 (seven) days. Take with a full glass of water on an empty stomach., Disp: 4 tablet, Rfl: 11 .  Aloe Vera 25 MG CAPS, Take 1 capsule by mouth daily., Disp: , Rfl:  .  ascorbic acid (VITAMIN C) 1000 MG tablet, Take 1,000 mg by mouth daily., Disp: , Rfl:  .  aspirin 81 MG tablet, Take 81 mg by mouth daily. , Disp: , Rfl:  .  BEE POLLEN PO, Take 400 mg by mouth daily., Disp: , Rfl:  .  Cholecalciferol (VITAMIN D3) 2000 units TABS, Take by mouth daily., Disp:  , Rfl:  .  clobetasol cream (TEMOVATE) 5.62 %, Apply 1 application topically 2 (two) times daily as needed., Disp: 30 g, Rfl: 3 .  Coenzyme Q10 (CO Q 10) 100 MG CAPS, Take 1 capsule by mouth daily., Disp: , Rfl:  .  Digestive Enzymes (PAPAYA ENZYME PO), Take by mouth daily., Disp: , Rfl:  .  meloxicam (MOBIC) 15 MG tablet, Take 15 mg by mouth as needed. , Disp: , Rfl:  .  metoprolol tartrate (LOPRESSOR) 50 MG tablet, Take 1 tablet (50 mg total) by mouth 2 (two) times daily., Disp: 180 tablet, Rfl: 1 .  Misc Natural Products (TUMERSAID) TABS, Take 75 mg by mouth daily., Disp: , Rfl:  .  OVER THE COUNTER MEDICATION, daily., Disp: , Rfl:  .  OVER THE COUNTER MEDICATION, , Disp: , Rfl:  .  rosuvastatin (CRESTOR) 5 MG tablet, Take 1 tablet (5 mg total) by mouth daily., Disp: 90 tablet, Rfl: 1 .  vitamin B-12 (CYANOCOBALAMIN) 1000 MCG tablet, Take 1,000 mcg by mouth daily., Disp: , Rfl:  Social History   Socioeconomic History  . Marital status: Married    Spouse name: Not on file  .  Number of children: 3  . Years of education: Not on file  . Highest education level: Not on file  Occupational History  . Not on file  Social Needs  . Financial resource strain: Not on file  . Food insecurity:    Worry: Not on file    Inability: Not on file  . Transportation needs:    Medical: Not on file    Non-medical: Not on file  Tobacco Use  . Smoking status: Former Smoker    Packs/day: 0.25    Years: 5.00    Pack years: 1.25    Types: Cigarettes    Last attempt to quit: 04/14/2003    Years since quitting: 14.3  . Smokeless tobacco: Never Used  Substance and Sexual Activity  . Alcohol use: Yes    Comment: once in a while-social  . Drug use: Never    Comment: hemp oil   . Sexual activity: Not Currently  Lifestyle  . Physical activity:    Days per week: Not on file    Minutes per session: Not on file  . Stress: Not on file  Relationships  . Social connections:    Talks on phone: Not on file      Gets together: Not on file    Attends religious service: Not on file    Active member of club or organization: Not on file    Attends meetings of clubs or organizations: Not on file    Relationship status: Not on file  . Intimate partner violence:    Fear of current or ex partner: Not on file    Emotionally abused: Not on file    Physically abused: Not on file    Forced sexual activity: Not on file  Other Topics Concern  . Not on file  Social History Narrative  . Not on file   Family History  Problem Relation Age of Onset  . Cirrhosis Mother        drinking  . Other Brother        duodenal ulcer  . Heart disease Sister   . Cancer Son 20       colon  . Gallbladder disease Maternal Grandmother   . Stomach cancer Paternal Grandfather     Health Maintenance: ***  Objective: Office vital signs reviewed. There were no vitals taken for this visit.  Physical Examination:  General: Awake, alert, *** nourished, No acute distress HEENT: Normal    Neck: No masses palpated. No lymphadenopathy    Ears: Tympanic membranes intact, normal light reflex, no erythema, no bulging    Eyes: PERRLA, extraocular movement in tact, sclera ***    Nose: nasal turbinates moist, *** nasal discharge    Throat: moist mucus membranes, no erythema, *** tonsillar exudate.  Airway is patent Cardio: regular rate and rhythm, S1S2 heard, no murmurs appreciated Pulm: clear to auscultation bilaterally, no wheezes, rhonchi or rales; normal work of breathing on room air GI: soft, non-tender, non-distended, bowel sounds present x4, no hepatomegaly, no splenomegaly, no masses GU: external vaginal tissue ***, cervix ***, *** punctate lesions on cervix appreciated, *** discharge from cervical os, *** bleeding, *** cervical motion tenderness, *** abdominal/ adnexal masses Extremities: warm, well perfused, No edema, cyanosis or clubbing; +*** pulses bilaterally MSK: *** gait and *** station Skin: dry; intact; no  rashes or lesions Neuro: *** Strength and light touch sensation grossly intact, *** DTRs ***/4  Assessment/ Plan: 82 y.o. female   No problem-specific Assessment & Plan notes  found for this encounter.   Megan Norlander, DO [place] [phone]

## 2017-08-25 NOTE — Telephone Encounter (Signed)
F/u with PCP.  

## 2017-09-07 ENCOUNTER — Other Ambulatory Visit: Payer: Self-pay

## 2017-09-07 ENCOUNTER — Ambulatory Visit (HOSPITAL_COMMUNITY): Payer: Medicare Other | Attending: Cardiology

## 2017-09-07 DIAGNOSIS — I10 Essential (primary) hypertension: Secondary | ICD-10-CM | POA: Insufficient documentation

## 2017-09-07 DIAGNOSIS — D649 Anemia, unspecified: Secondary | ICD-10-CM | POA: Diagnosis not present

## 2017-09-07 DIAGNOSIS — R002 Palpitations: Secondary | ICD-10-CM | POA: Insufficient documentation

## 2017-09-07 DIAGNOSIS — I272 Pulmonary hypertension, unspecified: Secondary | ICD-10-CM | POA: Diagnosis not present

## 2017-09-07 DIAGNOSIS — E785 Hyperlipidemia, unspecified: Secondary | ICD-10-CM | POA: Insufficient documentation

## 2017-09-07 DIAGNOSIS — I350 Nonrheumatic aortic (valve) stenosis: Secondary | ICD-10-CM | POA: Diagnosis not present

## 2017-09-13 NOTE — Progress Notes (Addendum)
Patient ID: [name], female   DOB: 07-Nov-1935, 82 y.o.   MRN: [id number]  82 y.o. history of HTN, HLD, fibromyalgia. Atypical chest pain with normal cath in 2008 and normal myovue in 2009.  Reviewed her TTE from 09/07/17 and AS has progressed to severe with EF 65-70% mean gradient  32-40 mmHg peak gradient 58-74 mmHg trace AR and mild MR  Has had GERD Rx with OTC Rx Back on crestor for HLD  Long discussion with her and husband as well as grand daughter about Rx option and natural history of severe AS. She has been getting more fatigue and exertional dyspnea Discussed the process of TAVR and need for heart cath , CT scans and 2 surgical opinions She is willing to proceed and certainly does not want conventional surgery   ROS: Denies fever, malais, weight loss, blurry vision, decreased visual acuity, cough, sputum, SOB, hemoptysis, pleuritic pain, palpitaitons, heartburn, abdominal pain, melena, lower extremity edema, claudication, or rash.  All other systems reviewed and negative  General: BP (!) 160/74   Pulse (!) 58   Ht 5\' 2"  (1.575 m)   Wt 159 lb (72.1 kg)   SpO2 98%   BMI 29.08 kg/m  Affect appropriate Healthy:  appears stated age 25: normal Neck supple with no adenopathy JVP normal no bruits no thyromegaly Lungs clear with no wheezing and good diaphragmatic motion Heart:  S1/S muffled AS   murmur, no rub, gallop or click PMI normal Abdomen: benighn, BS positve, no tenderness, no AAA no bruit.  No HSM or HJR Distal pulses intact with no bruits No edema Neuro non-focal Skin warm and dry No muscular weakness    Current Outpatient Medications  Medication Sig Dispense Refill  . alendronate (FOSAMAX) 70 MG tablet Take 1 tablet (70 mg total) by mouth every 7 (seven) days. Take with a full glass of water on an empty stomach. 4 tablet 11  . Aloe Vera 25 MG CAPS Take 1 capsule by mouth daily.    Marland Kitchen ascorbic acid (VITAMIN C) 1000 MG tablet Take 1,000 mg by mouth daily.    Marland Kitchen  aspirin 81 MG tablet Take 81 mg by mouth daily.     Marland Kitchen BEE POLLEN PO Take 400 mg by mouth daily.    . Cholecalciferol (VITAMIN D3) 2000 units TABS Take by mouth daily.    . clobetasol cream (TEMOVATE) 7.62 % Apply 1 application topically 2 (two) times daily as needed. 30 g 3  . Coenzyme Q10 (CO Q 10) 100 MG CAPS Take 1 capsule by mouth daily.    . Digestive Enzymes (PAPAYA ENZYME PO) Take by mouth daily.    . meloxicam (MOBIC) 15 MG tablet Take 15 mg by mouth as needed.     . metoprolol tartrate (LOPRESSOR) 50 MG tablet Take 1 tablet (50 mg total) by mouth 2 (two) times daily. 180 tablet 1  . Misc Natural Products (TUMERSAID) TABS Take 75 mg by mouth daily.    . Omega 3-6-9 Fatty Acids (OMEGA-3 & OMEGA-6 FISH OIL PO) Take 1 capsule by mouth as directed. Hemp oil    . OVER THE COUNTER MEDICATION daily.    Marland Kitchen OVER THE COUNTER MEDICATION     . rosuvastatin (CRESTOR) 5 MG tablet Take 1 tablet (5 mg total) by mouth daily. 90 tablet 1  . vitamin B-12 (CYANOCOBALAMIN) 1000 MCG tablet Take 1,000 mcg by mouth daily.     No current facility-administered medications for this visit.     Allergies  Tape and Prolia [denosumab]  Electrocardiogram: 2.17  SR normal ECG  05/21/14  SR rate 59  Normal  07/31/16  NSR rate 53 normal  6419 SR rate 52 normal   Assessment and Plan  Chest Pain: resolved normal cath 2008 and normal myovue 2009 recent   AS:  Progressed to severe. Discussed need for right and left heart cath and referral to CVTS and consideration for open repair or possibly TAVR Risks of cath including bleeding, stroke MI and need for emergency surgery discussed Willing to proceed  Will set up for right and left cath with Dr Burt Knack along with CT scans and initial surgical consult with Dr Roxy Manns or Cyndia Bent She will have lab work today   HTN:  Well controlled.  Continue current medications and low sodium Dash type diet.    Chol:  Back on crestor f/u lipid clinic   Lab Results  Component Value Date    LDLCALC 71 05/25/2017     [name]

## 2017-09-13 NOTE — H&P (View-Only) (Signed)
Patient ID: [name], female   DOB: 05-Oct-1935, 82 y.o.   MRN: [id number]  82 y.o. history of HTN, HLD, fibromyalgia. Atypical chest pain with normal cath in 2008 and normal myovue in 2009.  Reviewed her TTE from 09/07/17 and AS has progressed to severe with EF 65-70% mean gradient  32-40 mmHg peak gradient 58-74 mmHg trace AR and mild MR  Has had GERD Rx with OTC Rx Back on crestor for HLD  Long discussion with her and husband as well as grand daughter about Rx option and natural history of severe AS. She has been getting more fatigue and exertional dyspnea Discussed the process of TAVR and need for heart cath , CT scans and 2 surgical opinions She is willing to proceed and certainly does not want conventional surgery   ROS: Denies fever, malais, weight loss, blurry vision, decreased visual acuity, cough, sputum, SOB, hemoptysis, pleuritic pain, palpitaitons, heartburn, abdominal pain, melena, lower extremity edema, claudication, or rash.  All other systems reviewed and negative  General: BP (!) 160/74   Pulse (!) 58   Ht 5\' 2"  (1.575 m)   Wt 159 lb (72.1 kg)   SpO2 98%   BMI 29.08 kg/m  Affect appropriate Healthy:  appears stated age HEENT: normal Neck supple with no adenopathy JVP normal no bruits no thyromegaly Lungs clear with no wheezing and good diaphragmatic motion Heart:  S1/S muffled AS   murmur, no rub, gallop or click PMI normal Abdomen: benighn, BS positve, no tenderness, no AAA no bruit.  No HSM or HJR Distal pulses intact with no bruits No edema Neuro non-focal Skin warm and dry No muscular weakness    Current Outpatient Medications  Medication Sig Dispense Refill  . alendronate (FOSAMAX) 70 MG tablet Take 1 tablet (70 mg total) by mouth every 7 (seven) days. Take with a full glass of water on an empty stomach. 4 tablet 11  . Aloe Vera 25 MG CAPS Take 1 capsule by mouth daily.    Marland Kitchen ascorbic acid (VITAMIN C) 1000 MG tablet Take 1,000 mg by mouth daily.    Marland Kitchen  aspirin 81 MG tablet Take 81 mg by mouth daily.     Marland Kitchen BEE POLLEN PO Take 400 mg by mouth daily.    . Cholecalciferol (VITAMIN D3) 2000 units TABS Take by mouth daily.    . clobetasol cream (TEMOVATE) 9.39 % Apply 1 application topically 2 (two) times daily as needed. 30 g 3  . Coenzyme Q10 (CO Q 10) 100 MG CAPS Take 1 capsule by mouth daily.    . Digestive Enzymes (PAPAYA ENZYME PO) Take by mouth daily.    . meloxicam (MOBIC) 15 MG tablet Take 15 mg by mouth as needed.     . metoprolol tartrate (LOPRESSOR) 50 MG tablet Take 1 tablet (50 mg total) by mouth 2 (two) times daily. 180 tablet 1  . Misc Natural Products (TUMERSAID) TABS Take 75 mg by mouth daily.    . Omega 3-6-9 Fatty Acids (OMEGA-3 & OMEGA-6 FISH OIL PO) Take 1 capsule by mouth as directed. Hemp oil    . OVER THE COUNTER MEDICATION daily.    Marland Kitchen OVER THE COUNTER MEDICATION     . rosuvastatin (CRESTOR) 5 MG tablet Take 1 tablet (5 mg total) by mouth daily. 90 tablet 1  . vitamin B-12 (CYANOCOBALAMIN) 1000 MCG tablet Take 1,000 mcg by mouth daily.     No current facility-administered medications for this visit.     Allergies  Tape and Prolia [denosumab]  Electrocardiogram: 2.17  SR normal ECG  05/21/14  SR rate 59  Normal  07/31/16  NSR rate 53 normal  6419 SR rate 52 normal   Assessment and Plan  Chest Pain: resolved normal cath 2008 and normal myovue 2009 recent   AS:  Progressed to severe. Discussed need for right and left heart cath and referral to CVTS and consideration for open repair or possibly TAVR Risks of cath including bleeding, stroke MI and need for emergency surgery discussed Willing to proceed  Will set up for right and left cath with Dr Burt Knack along with CT scans and initial surgical consult with Dr Roxy Manns or Cyndia Bent She will have lab work today   HTN:  Well controlled.  Continue current medications and low sodium Dash type diet.    Chol:  Back on crestor f/u lipid clinic   Lab Results  Component Value Date    LDLCALC 71 05/25/2017     Jenkins Rouge

## 2017-09-14 ENCOUNTER — Ambulatory Visit: Payer: Medicare Other | Admitting: Cardiovascular Disease

## 2017-09-14 ENCOUNTER — Other Ambulatory Visit (HOSPITAL_COMMUNITY): Payer: Self-pay | Admitting: Cardiovascular Disease

## 2017-09-14 ENCOUNTER — Telehealth (INDEPENDENT_AMBULATORY_CARE_PROVIDER_SITE_OTHER): Payer: Self-pay

## 2017-09-14 ENCOUNTER — Encounter: Payer: Self-pay | Admitting: Cardiovascular Disease

## 2017-09-14 ENCOUNTER — Telehealth: Payer: Self-pay

## 2017-09-14 VITALS — BP 160/74 | HR 58 | Ht 62.0 in | Wt 159.0 lb

## 2017-09-14 DIAGNOSIS — I350 Nonrheumatic aortic (valve) stenosis: Secondary | ICD-10-CM | POA: Diagnosis not present

## 2017-09-14 DIAGNOSIS — I10 Essential (primary) hypertension: Secondary | ICD-10-CM

## 2017-09-14 DIAGNOSIS — E7800 Pure hypercholesterolemia, unspecified: Secondary | ICD-10-CM

## 2017-09-14 LAB — CBC WITH DIFFERENTIAL/PLATELET
BASOS: 1 %
Basophils Absolute: 0 10*3/uL (ref 0.0–0.2)
EOS (ABSOLUTE): 0.3 10*3/uL (ref 0.0–0.4)
EOS: 3 %
HEMATOCRIT: 41.9 % (ref 34.0–46.6)
HEMOGLOBIN: 14.1 g/dL (ref 11.1–15.9)
IMMATURE GRANULOCYTES: 0 %
Immature Grans (Abs): 0 10*3/uL (ref 0.0–0.1)
LYMPHS ABS: 2.8 10*3/uL (ref 0.7–3.1)
Lymphs: 36 %
MCH: 30.7 pg (ref 26.6–33.0)
MCHC: 33.7 g/dL (ref 31.5–35.7)
MCV: 91 fL (ref 79–97)
MONOCYTES: 8 %
Monocytes Absolute: 0.6 10*3/uL (ref 0.1–0.9)
NEUTROS PCT: 52 %
Neutrophils Absolute: 3.9 10*3/uL (ref 1.4–7.0)
Platelets: 261 10*3/uL (ref 150–450)
RBC: 4.6 x10E6/uL (ref 3.77–5.28)
RDW: 13.2 % (ref 12.3–15.4)
WBC: 7.6 10*3/uL (ref 3.4–10.8)

## 2017-09-14 LAB — BASIC METABOLIC PANEL
BUN / CREAT RATIO: 13 (ref 12–28)
BUN: 9 mg/dL (ref 8–27)
CO2: 25 mmol/L (ref 20–29)
CREATININE: 0.71 mg/dL (ref 0.57–1.00)
Calcium: 9.6 mg/dL (ref 8.7–10.3)
Chloride: 103 mmol/L (ref 96–106)
GFR calc Af Amer: 93 mL/min/{1.73_m2} (ref >59)
GFR, EST NON AFRICAN AMERICAN: 81 mL/min/{1.73_m2} (ref >59)
Glucose: 106 mg/dL — ABNORMAL HIGH (ref 65–99)
Potassium: 5.4 mmol/L — ABNORMAL HIGH (ref 3.5–5.2)
SODIUM: 140 mmol/L (ref 134–144)

## 2017-09-14 NOTE — Patient Instructions (Addendum)
Medication Instructions:  Your physician recommends that you continue on your current medications as directed. Please refer to the Current Medication list given to you today.  Labwork: Your physician recommends that you have lab work today- BMET and CBC  Testing/Procedures: Your physician has requested that you have cardiac CT. Cardiac computed tomography (CT) is a painless test that uses an x-ray machine to take clear, detailed pictures of your heart. For further information please visit [email or web address]. Please follow instruction sheet as given.  Follow-Up: Your physician wants you to follow-up in: 6 months with Dr. Johnsie Cancel. You will receive a reminder letter in the mail two months in advance. If you don't receive a letter, please call our office to schedule the follow-up appointment.  You have been referred to Dr. Cyndia Bent, their office will call you.    If you need a refill on your cardiac medications before your next appointment, please call your pharmacy.   [name]  09/14/2017  You are scheduled for a Cardiac Catheterization on Wednesday, June 19 with Dr. Sherren Mocha.  1. Please arrive at the [facility] (Main Entrance A) at [facility]: [address] [place], [place] [address] at 6:30 AM (two hours before your procedure to ensure your preparation). Free valet parking service is available.   Special note: Every effort is made to have your procedure done on time. Please understand that emergencies sometimes delay scheduled procedures.  2. Diet: Do not eat or drink anything after midnight prior to your procedure except sips of water to take medications.  3. Labs: None needed.  4. Medication instructions in preparation for your procedure:  On the morning of your procedure, take your Aspirin and any morning medicines NOT listed above.  You may use sips of water.  5. Plan for one night stay--bring personal belongings. 6. Bring a current list of your medications and  current insurance cards. 7. You MUST have a responsible person to drive you home. 8. Someone MUST be with you the first 24 hours after you arrive home or your discharge will be delayed. 9. Please wear clothes that are easy to get on and off and wear slip-on shoes.  Thank you for allowing [place] to care for you!   -- [facility]

## 2017-09-14 NOTE — Addendum Note (Signed)
Addended by: Aris Georgia, Saoirse Legere L on: 09/14/2017 11:20 AM   Modules accepted: Orders

## 2017-09-14 NOTE — Telephone Encounter (Signed)
Submitted application online for Monovisc injection, right knee. 

## 2017-09-14 NOTE — Telephone Encounter (Addendum)
Called patient to let her know when her CT test are scheduled. Patient would like a letter sent for instructions for CT test. Sent a MyChart message and mailed letter of instructions as well.

## 2017-09-15 NOTE — Telephone Encounter (Signed)
Pam - Ms. Goding has been scheduled first available on 6/21 at 1:30PM. Arrival time will be 12:45PM-1:30PM. Please notify patient.

## 2017-09-15 NOTE — Addendum Note (Signed)
Addended by: Aris Georgia, Mercy Malena L on: 09/15/2017 12:27 PM   Modules accepted: Orders

## 2017-09-16 DIAGNOSIS — Z1231 Encounter for screening mammogram for malignant neoplasm of breast: Secondary | ICD-10-CM | POA: Diagnosis not present

## 2017-09-16 LAB — HM MAMMOGRAPHY

## 2017-09-24 ENCOUNTER — Telehealth (INDEPENDENT_AMBULATORY_CARE_PROVIDER_SITE_OTHER): Payer: Self-pay

## 2017-09-24 NOTE — Telephone Encounter (Signed)
PA required for Monovisc injection, right knee.

## 2017-09-28 ENCOUNTER — Telehealth: Payer: Self-pay | Admitting: *Deleted

## 2017-09-28 NOTE — Telephone Encounter (Signed)
Pt contacted pre-catheterization scheduled at [facility] for: Wednesday September 29, 2017 8:30 AM Verified arrival time and place: [facility] En[facility] at: 6:30 AM  No solid food after midnight prior to cath, clear liquids until 5 AM day of procedure. Verified allergies in Epic Verified no diabetes medications.  AM meds can be  taken pre-cath with sip of water including: ASA 81 mg  Confirmed patient has responsible person to drive home post procedure and observe patient for 24 hours: yes

## 2017-09-29 ENCOUNTER — Ambulatory Visit (HOSPITAL_COMMUNITY)
Admission: RE | Admit: 2017-09-29 | Discharge: 2017-09-29 | Disposition: A | Discharge status: Home / Self Care | Payer: Medicare Other | Source: Ambulatory Visit | Attending: Cardiovascular Disease | Admitting: Cardiovascular Disease

## 2017-09-29 ENCOUNTER — Encounter (HOSPITAL_COMMUNITY): Payer: Self-pay | Admitting: Cardiovascular Disease

## 2017-09-29 ENCOUNTER — Other Ambulatory Visit: Payer: Self-pay

## 2017-09-29 ENCOUNTER — Encounter (HOSPITAL_COMMUNITY)
Admission: RE | Disposition: A | Discharge status: Home / Self Care | Payer: Self-pay | Source: Ambulatory Visit | Attending: Cardiovascular Disease

## 2017-09-29 DIAGNOSIS — Z888 Allergy status to other drugs, medicaments and biological substances status: Secondary | ICD-10-CM | POA: Diagnosis not present

## 2017-09-29 DIAGNOSIS — E785 Hyperlipidemia, unspecified: Secondary | ICD-10-CM | POA: Diagnosis not present

## 2017-09-29 DIAGNOSIS — I350 Nonrheumatic aortic (valve) stenosis: Secondary | ICD-10-CM

## 2017-09-29 DIAGNOSIS — Z7982 Long term (current) use of aspirin: Secondary | ICD-10-CM | POA: Insufficient documentation

## 2017-09-29 DIAGNOSIS — I10 Essential (primary) hypertension: Secondary | ICD-10-CM | POA: Insufficient documentation

## 2017-09-29 DIAGNOSIS — M797 Fibromyalgia: Secondary | ICD-10-CM | POA: Diagnosis not present

## 2017-09-29 DIAGNOSIS — R0609 Other forms of dyspnea: Principal | ICD-10-CM

## 2017-09-29 DIAGNOSIS — R0789 Other chest pain: Secondary | ICD-10-CM | POA: Diagnosis not present

## 2017-09-29 DIAGNOSIS — Z79899 Other long term (current) drug therapy: Secondary | ICD-10-CM | POA: Diagnosis not present

## 2017-09-29 HISTORY — PX: RIGHT/LEFT HEART CATH AND CORONARY ANGIOGRAPHY: CATH118266

## 2017-09-29 LAB — POCT I-STAT 3, VENOUS BLOOD GAS (G3P V)
ACID-BASE DEFICIT: 3 mmol/L — AB (ref 0.0–2.0)
Bicarbonate: 23 mmol/L (ref 20.0–28.0)
O2 SAT: 69 %
PO2 VEN: 39 mmHg (ref 32.0–45.0)
TCO2: 24 mmol/L (ref 22–32)
pCO2, Ven: 43.9 mmHg — ABNORMAL LOW (ref 44.0–60.0)
pH, Ven: 7.328 (ref 7.250–7.430)

## 2017-09-29 LAB — POCT ACTIVATED CLOTTING TIME: Activated Clotting Time: 153 seconds

## 2017-09-29 SURGERY — RIGHT/LEFT HEART CATH AND CORONARY ANGIOGRAPHY
Anesthesia: LOCAL

## 2017-09-29 MED ORDER — LIDOCAINE HCL (PF) 1 % IJ SOLN
INTRAMUSCULAR | Status: AC
  Filled 2017-09-29: qty 30

## 2017-09-29 MED ORDER — SODIUM CHLORIDE 0.9 % WEIGHT BASED INFUSION
3.0000 mL/kg/h | INTRAVENOUS | Status: AC
  Administered 2017-09-29: 3 mL/kg/h via INTRAVENOUS

## 2017-09-29 MED ORDER — HEPARIN (PORCINE) IN NACL 1000-0.9 UT/500ML-% IV SOLN
INTRAVENOUS | Status: AC
  Filled 2017-09-29: qty 500

## 2017-09-29 MED ORDER — SODIUM CHLORIDE 0.9% FLUSH: 3.0000 mL | Freq: Two times a day (BID) | INTRAVENOUS | Status: DC

## 2017-09-29 MED ORDER — SODIUM CHLORIDE 0.9 % IV SOLN: 250.0000 mL | INTRAVENOUS | Status: DC | PRN

## 2017-09-29 MED ORDER — ASPIRIN 81 MG PO CHEW: 81.0000 mg | CHEWABLE_TABLET | ORAL | Status: DC

## 2017-09-29 MED ORDER — SODIUM CHLORIDE 0.9 % WEIGHT BASED INFUSION: 1.0000 mL/kg/h | INTRAVENOUS | Status: DC

## 2017-09-29 MED ORDER — ONDANSETRON HCL 4 MG/2ML IJ SOLN: 4.0000 mg | Freq: Four times a day (QID) | INTRAMUSCULAR | Status: DC | PRN

## 2017-09-29 MED ORDER — IOHEXOL 350 MG/ML SOLN
INTRAVENOUS | Status: DC | PRN
  Administered 2017-09-29: 55 mL via INTRA_ARTERIAL

## 2017-09-29 MED ORDER — FENTANYL CITRATE (PF) 100 MCG/2ML IJ SOLN
INTRAMUSCULAR | Status: AC
  Filled 2017-09-29: qty 2

## 2017-09-29 MED ORDER — VERAPAMIL HCL 2.5 MG/ML IV SOLN
INTRAVENOUS | Status: AC
  Filled 2017-09-29: qty 2

## 2017-09-29 MED ORDER — SODIUM CHLORIDE 0.9% FLUSH: 3.0000 mL | INTRAVENOUS | Status: DC | PRN

## 2017-09-29 MED ORDER — HEPARIN SODIUM (PORCINE) 1000 UNIT/ML IJ SOLN
INTRAMUSCULAR | Status: DC | PRN
  Administered 2017-09-29: 3000 [IU] via INTRAVENOUS

## 2017-09-29 MED ORDER — LIDOCAINE HCL (PF) 1 % IJ SOLN
INTRAMUSCULAR | Status: DC | PRN
  Administered 2017-09-29 (×2): 2 mL

## 2017-09-29 MED ORDER — MIDAZOLAM HCL 2 MG/2ML IJ SOLN
INTRAMUSCULAR | Status: AC
  Filled 2017-09-29: qty 2

## 2017-09-29 MED ORDER — NITROGLYCERIN 1 MG/10 ML FOR IR/CATH LAB
INTRA_ARTERIAL | Status: DC | PRN
  Administered 2017-09-29: 09:00:00

## 2017-09-29 MED ORDER — ACETAMINOPHEN 325 MG PO TABS: 650.0000 mg | ORAL_TABLET | ORAL | Status: DC | PRN

## 2017-09-29 MED ORDER — VERAPAMIL HCL 2.5 MG/ML IV SOLN
INTRAVENOUS | Status: DC | PRN
  Administered 2017-09-29: 09:00:00 via INTRA_ARTERIAL

## 2017-09-29 MED ORDER — MIDAZOLAM HCL 2 MG/2ML IJ SOLN
INTRAMUSCULAR | Status: DC | PRN
  Administered 2017-09-29 (×2): 1 mg via INTRAVENOUS

## 2017-09-29 MED ORDER — IOHEXOL 300 MG/ML  SOLN: INTRAMUSCULAR | Status: DC | PRN

## 2017-09-29 MED ORDER — FENTANYL CITRATE (PF) 100 MCG/2ML IJ SOLN
INTRAMUSCULAR | Status: DC | PRN
  Administered 2017-09-29 (×2): 25 ug via INTRAVENOUS

## 2017-09-29 SURGICAL SUPPLY — 15 items
CATH 5FR JL3.5 JR4 ANG PIG MP (CATHETERS) ×2 IMPLANT
CATH SWAN GANZ 7F STRAIGHT (CATHETERS) ×2 IMPLANT
COVER PRB 48X5XTLSCP FOLD TPE (BAG) ×2 IMPLANT
COVER PROBE 5X48 (BAG) ×2
DEVICE RAD COMP TR BAND LRG (VASCULAR PRODUCTS) ×2 IMPLANT
GLIDESHEATH SLEND A-KIT 6F 22G (SHEATH) ×2 IMPLANT
GUIDEWIRE INQWIRE 1.5J.035X260 (WIRE) ×1 IMPLANT
INQWIRE 1.5J .035X260CM (WIRE) ×2
KIT HEART LEFT (KITS) ×2 IMPLANT
PACK CARDIAC CATHETERIZATION (CUSTOM PROCEDURE TRAY) ×2 IMPLANT
SHEATH GLIDE SLENDER 4/5FR (SHEATH) ×2 IMPLANT
SHEATH PINNACLE 7F 10CM (SHEATH) ×2 IMPLANT
TRANSDUCER W/STOPCOCK (MISCELLANEOUS) ×2 IMPLANT
TUBING CIL FLEX 10 FLL-RA (TUBING) ×2 IMPLANT
WIRE HI TORQ VERSACORE-J 145CM (WIRE) ×2 IMPLANT

## 2017-09-29 NOTE — Discharge Instructions (Signed)
Drink plenty of fluids over next 48 hour and keep right wrist elevated at heart level for 24 hours  Radial Site Care Refer to this sheet in the next few weeks. These instructions provide you with information about caring for yourself after your procedure. Your health care provider may also give you more specific instructions. Your treatment has been planned according to current medical practices, but problems sometimes occur. Call your health care provider if you have any problems or questions after your procedure. What can I expect after the procedure? After your procedure, it is typical to have the following:  Bruising at the radial site that usually fades within 1-2 weeks.  Blood collecting in the tissue (hematoma) that may be painful to the touch. It should usually decrease in size and tenderness within 1-2 weeks.  Follow these instructions at home:  Take medicines only as directed by your health care provider.  You may shower 24-48 hours after the procedure or as directed by your health care provider. Remove the bandage (dressing) and gently wash the site with plain soap and water. Pat the area dry with a clean towel. Do not rub the site, because this may cause bleeding.  Do not take baths, swim, or use a hot tub until your health care provider approves.  Check your insertion site every day for redness, swelling, or drainage.  Do not apply powder or lotion to the site.  Do not flex or bend the affected arm for 24 hours or as directed by your health care provider.  Do not push or pull heavy objects with the affected arm for 24 hours or as directed by your health care provider.  Do not lift over 10 lb (4.5 kg) for 5 days after your procedure or as directed by your health care provider.  Ask your health care provider when it is okay to: ? Return to work or school. ? Resume usual physical activities or sports. ? Resume sexual activity.  Do not drive home if you are discharged the  same day as the procedure. Have someone else drive you.  You may drive 24 hours after the procedure unless otherwise instructed by your health care provider.  Do not operate machinery or power tools for 24 hours after the procedure.  If your procedure was done as an outpatient procedure, which means that you went home the same day as your procedure, a responsible adult should be with you for the first 24 hours after you arrive home.  Keep all follow-up visits as directed by your health care provider. This is important. Contact a health care provider if:  You have a fever.  You have chills.  You have increased bleeding from the radial site. Hold pressure on the site. Get help right away if:  You have unusual pain at the radial site.  You have redness, warmth, or swelling at the radial site.  You have drainage (other than a small amount of blood on the dressing) from the radial site.  The radial site is bleeding, and the bleeding does not stop after 30 minutes of holding steady pressure on the site.  Your arm or hand becomes pale, cool, tingly, or numb. This information is not intended to replace advice given to you by your health care provider. Make sure you discuss any questions you have with your health care provider. Document Released: 05/02/2010 Document Revised: 09/05/2015 Document Reviewed: 10/16/2013 Elsevier Interactive Patient Education  2018 [employer].   Femoral Site Care Refer  to this sheet in the next few weeks. These instructions provide you with information about caring for yourself after your procedure. Your health care provider may also give you more specific instructions. Your treatment has been planned according to current medical practices, but problems sometimes occur. Call your health care provider if you have any problems or questions after your procedure. What can I expect after the procedure? After your procedure, it is typical to have the  following:  Bruising at the site that usually fades within 1-2 weeks.  Blood collecting in the tissue (hematoma) that may be painful to the touch. It should usually decrease in size and tenderness within 1-2 weeks.  Follow these instructions at home:  Take medicines only as directed by your health care provider.  You may shower 24-48 hours after the procedure or as directed by your health care provider. Remove the bandage (dressing) and gently wash the site with plain soap and water. Pat the area dry with a clean towel. Do not rub the site, because this may cause bleeding.  Do not take baths, swim, or use a hot tub until your health care provider approves.  Check your insertion site every day for redness, swelling, or drainage.  Do not apply powder or lotion to the site.  Limit use of stairs to twice a day for the first 2-3 days or as directed by your health care provider.  Do not squat for the first 2-3 days or as directed by your health care provider.  Do not lift over 10 lb (4.5 kg) for 5 days after your procedure or as directed by your health care provider.  Ask your health care provider when it is okay to: ? Return to work or school. ? Resume usual physical activities or sports. ? Resume sexual activity.  Do not drive home if you are discharged the same day as the procedure. Have someone else drive you.  You may drive 24 hours after the procedure unless otherwise instructed by your health care provider.  Do not operate machinery or power tools for 24 hours after the procedure or as directed by your health care provider.  If your procedure was done as an outpatient procedure, which means that you went home the same day as your procedure, a responsible adult should be with you for the first 24 hours after you arrive home.  Keep all follow-up visits as directed by your health care provider. This is important. Contact a health care provider if:  You have a fever.  You have  chills.  You have increased bleeding from the site. Hold pressure on the site. Get help right away if:  You have unusual pain at the site.  You have redness, warmth, or swelling at the site.  You have drainage (other than a small amount of blood on the dressing) from the site.  The site is bleeding, and the bleeding does not stop after 30 minutes of holding steady pressure on the site.  Your leg or foot becomes pale, cool, tingly, or numb. This information is not intended to replace advice given to you by your health care provider. Make sure you discuss any questions you have with your health care provider. Document Released: 12/01/2013 Document Revised: 09/05/2015 Document Reviewed: 10/17/2013 Elsevier Interactive Patient Education  [employer].

## 2017-09-29 NOTE — Interval H&P Note (Signed)
History and Physical Interval Note:  09/29/2017 8:32 AM  [name]  has presented today for surgery, with the diagnosis of as  The various methods of treatment have been discussed with the patient and family. After consideration of risks, benefits and other options for treatment, the patient has consented to  Procedure(s): RIGHT/LEFT HEART CATH AND CORONARY ANGIOGRAPHY (N/A) as a surgical intervention .  The patient's history has been reviewed, patient examined, no change in status, stable for surgery.  I have reviewed the patient's chart and labs.  Questions were answered to the patient's satisfaction.     Sherren Mocha

## 2017-09-30 ENCOUNTER — Telehealth (INDEPENDENT_AMBULATORY_CARE_PROVIDER_SITE_OTHER): Payer: Self-pay

## 2017-09-30 NOTE — Telephone Encounter (Signed)
Initiated PA for Monovisc, right knee. Pending review per Hope B. With [facility]. Pending Authorization#-[id number]

## 2017-10-01 ENCOUNTER — Ambulatory Visit (HOSPITAL_COMMUNITY): Payer: Medicare Other

## 2017-10-07 ENCOUNTER — Encounter: Payer: Medicare Other | Admitting: Surgery

## 2017-10-08 ENCOUNTER — Encounter (INDEPENDENT_AMBULATORY_CARE_PROVIDER_SITE_OTHER): Payer: Self-pay | Admitting: Radiology

## 2017-10-08 NOTE — Progress Notes (Unsigned)
Can you please call patient and tell her Monovisc injection right knee is approved for her?  She will owe 20% OOP, which [name] be up to $350, and we will bill her for this.  Please schedule appt with Deveshwar if patient wants to do injection.  Thanks!  Auth # [id number], valid 10/01/17 thru 12/29/17, buy and bill.

## 2017-10-11 ENCOUNTER — Ambulatory Visit: Payer: Medicare Other | Attending: Cardiovascular Disease | Admitting: Physical Therapy

## 2017-10-11 ENCOUNTER — Ambulatory Visit (HOSPITAL_COMMUNITY)
Admission: RE | Admit: 2017-10-11 | Discharge: 2017-10-11 | Disposition: A | Discharge status: Home / Self Care | Payer: Medicare Other | Source: Ambulatory Visit | Attending: Cardiovascular Disease | Admitting: Cardiovascular Disease

## 2017-10-11 ENCOUNTER — Institutional Professional Consult (permissible substitution): Payer: Medicare Other | Admitting: Surgery

## 2017-10-11 ENCOUNTER — Encounter: Payer: Self-pay | Admitting: Physical Therapy

## 2017-10-11 ENCOUNTER — Other Ambulatory Visit: Payer: Self-pay

## 2017-10-11 ENCOUNTER — Encounter: Payer: Self-pay | Admitting: Physician Assistant

## 2017-10-11 ENCOUNTER — Ambulatory Visit (HOSPITAL_BASED_OUTPATIENT_CLINIC_OR_DEPARTMENT_OTHER)
Admission: RE | Admit: 2017-10-11 | Discharge: 2017-10-11 | Disposition: A | Discharge status: Home / Self Care | Payer: Medicare Other | Source: Ambulatory Visit | Attending: Cardiovascular Disease | Admitting: Cardiovascular Disease

## 2017-10-11 ENCOUNTER — Encounter: Payer: Self-pay | Admitting: Surgery

## 2017-10-11 VITALS — BP 108/62 | HR 59 | Resp 18 | Ht 62.0 in | Wt 157.6 lb

## 2017-10-11 DIAGNOSIS — R2689 Other abnormalities of gait and mobility: Secondary | ICD-10-CM | POA: Diagnosis not present

## 2017-10-11 DIAGNOSIS — I700 Atherosclerosis of aorta: Secondary | ICD-10-CM | POA: Insufficient documentation

## 2017-10-11 DIAGNOSIS — I350 Nonrheumatic aortic (valve) stenosis: Secondary | ICD-10-CM

## 2017-10-11 DIAGNOSIS — I6523 Occlusion and stenosis of bilateral carotid arteries: Secondary | ICD-10-CM | POA: Diagnosis not present

## 2017-10-11 DIAGNOSIS — E7800 Pure hypercholesterolemia, unspecified: Secondary | ICD-10-CM

## 2017-10-11 DIAGNOSIS — J439 Emphysema, unspecified: Secondary | ICD-10-CM | POA: Insufficient documentation

## 2017-10-11 DIAGNOSIS — R911 Solitary pulmonary nodule: Secondary | ICD-10-CM | POA: Insufficient documentation

## 2017-10-11 DIAGNOSIS — R0609 Other forms of dyspnea: Secondary | ICD-10-CM | POA: Diagnosis not present

## 2017-10-11 DIAGNOSIS — K5730 Diverticulosis of large intestine without perforation or abscess without bleeding: Secondary | ICD-10-CM | POA: Diagnosis not present

## 2017-10-11 DIAGNOSIS — I10 Essential (primary) hypertension: Secondary | ICD-10-CM

## 2017-10-11 LAB — PULMONARY FUNCTION TEST
DL/VA % pred: 90 %
DL/VA: 4.1 ml/min/mmHg/L
DLCO unc % pred: 75 %
DLCO unc: 16.37 ml/min/mmHg
FEF 25-75 PRE: 1.19 L/s
FEF 25-75 Post: 1.88 L/sec
FEF2575-%CHANGE-POST: 57 %
FEF2575-%Pred-Post: 145 %
FEF2575-%Pred-Pre: 92 %
FEV1-%Change-Post: 13 %
FEV1-%PRED-POST: 103 %
FEV1-%Pred-Pre: 91 %
FEV1-PRE: 1.6 L
FEV1-Post: 1.82 L
FEV1FVC-%Change-Post: 11 %
FEV1FVC-%Pred-Pre: 96 %
FEV6-%CHANGE-POST: 3 %
FEV6-%PRED-POST: 102 %
FEV6-%PRED-PRE: 98 %
FEV6-POST: 2.28 L
FEV6-PRE: 2.2 L
FEV6FVC-%CHANGE-POST: 0 %
FEV6FVC-%PRED-POST: 106 %
FEV6FVC-%PRED-PRE: 106 %
FVC-%CHANGE-POST: 2 %
FVC-%PRED-POST: 96 %
FVC-%Pred-Pre: 94 %
FVC-Post: 2.28 L
FVC-Pre: 2.23 L
Post FEV1/FVC ratio: 80 %
Post FEV6/FVC ratio: 100 %
Pre FEV1/FVC ratio: 72 %
Pre FEV6/FVC Ratio: 100 %
RV % PRED: 149 %
RV: 3.43 L
TLC % pred: 122 %
TLC: 5.84 L

## 2017-10-11 MED ORDER — IOPAMIDOL (ISOVUE-370) INJECTION 76%
100.0000 mL | Freq: Once | INTRAVENOUS | Status: AC | PRN
  Administered 2017-10-11: 80 mL via INTRAVENOUS

## 2017-10-11 MED ORDER — ALBUTEROL SULFATE (2.5 MG/3ML) 0.083% IN NEBU
2.5000 mg | INHALATION_SOLUTION | Freq: Once | RESPIRATORY_TRACT | Status: AC
  Administered 2017-10-11: 2.5 mg via RESPIRATORY_TRACT

## 2017-10-11 MED ORDER — IOPAMIDOL (ISOVUE-370) INJECTION 76%
INTRAVENOUS | Status: AC
  Filled 2017-10-11: qty 100

## 2017-10-11 NOTE — Progress Notes (Addendum)
Bilateral carotid duplex completed. Bilateral 1% to 39% ICA stenosis. Vertebral artery flow is antegrade. [facility], [place]  10/11/2017 12:07 PM

## 2017-10-11 NOTE — Therapy (Addendum)
[place], [place], [address] Phone: [phone]   Fax:  [phone]  Physical Therapy Evaluation  Patient Details  Name: [name] MRN: [id number] Date of Birth: 15-Mar-1936 Referring Provider: Dr. Sherren Mocha   Encounter Date: 10/11/2017  PT End of Session - 10/11/17 1307    Visit Number  1    PT Start Time  8182    PT Stop Time  1341    PT Time Calculation (min)  36 min       Past Medical History:  Diagnosis Date  . Anemia   . Aortic stenosis   . Arthritis   . CAD (coronary artery disease) CARDIOLOGIST- DR Jenkins [place]-- LAST VISIT IN [facility]  . Chronic headaches   . Diverticulosis of colon   . Fibromyalgia   . GERD (gastroesophageal reflux disease)   . Hemorrhoids    INTERNAL--  POST BANDING 02/ 2015  . History of adenomatous polyp of colon   . History of kidney stones   . Hyperlipidemia   . Hypertension   . Hypothyroidism, postsurgical   . Moderate aortic stenosis   . OSA (obstructive sleep apnea) CPAP INTOLERANT   . Osteoporosis   . Spinal stenosis, multilevel   . Spondylosis, cervical     Past Surgical History:  Procedure Laterality Date  . ANTERIOR CERVICAL DECOMP/DISCECTOMY FUSION  11-11-1999   C5 - C6  . APPENDECTOMY  AGE 40  . CARDIAC CATHETERIZATION  2008  [facility]   ESSENTIALLY NORMAL  . CATARACT EXTRACTION W/ INTRAOCULAR LENS  IMPLANT, BILATERAL  2012  . FLEXIBLE SIGMOIDOSCOPY N/A 06/01/2013   Procedure: FLEXIBLE SIGMOIDOSCOPY;  Surgeon: Inda Castle, MD;  Location: [facility];  Service: Endoscopy;  Laterality: N/A;  may need hemorrhoidal banding  . HEMORRHOIDECTOMY WITH HEMORRHOID BANDING  08-07-2010  . LAPAROSCOPIC CHOLECYSTECTOMY  1990  . RIGHT/LEFT HEART CATH AND CORONARY ANGIOGRAPHY N/A 09/29/2017   Procedure: RIGHT/LEFT HEART CATH AND CORONARY ANGIOGRAPHY;  Surgeon: Sherren Mocha, MD;  Location: [facility];  Service: Cardiovascular;  Laterality: N/A;   . SHOULDER ARTHROSCOPY WITH OPEN ROTATOR CUFF REPAIR AND DISTAL CLAVICLE ACROMINECTOMY Right 05/25/2012   Procedure: SHOULDER ARTHROSCOPY WITH OPEN ROTATOR CUFF REPAIR AND DISTAL CLAVICLE ACROMINECTOMY;  Surgeon: Magnus Sinning, MD;  Location: [facility];  Service: Orthopedics;  Laterality: Right;  RIGHT SHOULDER ARTHROSCOPY WITH DERBRIDEMENTOF LABRAL/BICEP TENDON, OPEN DISTAL CLAVICLE RESECTION, ANTERIOR ACROMINECTOMY ROTATOR CUFF REPAIR ANESTHESIA: GENERAL/SCALENE NERVE BLOCK  . TENOSYNOVECTOMY Left 01/04/2014   Procedure: LEFT WRIST EXTENSOR TENOSYNOVECTOMY;  Surgeon: Linna Hoff, MD;  Location: [facility];  Service: Orthopedics;  Laterality: Left;  . THYROIDECTOMY  AGE 30   GOITER  . TONSILLECTOMY AND ADENOIDECTOMY  AGE 75  . TRANSTHORACIC ECHOCARDIOGRAM  last one 04-20-2013  DR [facility]    NORMAL LVSF/ EF 60-65%/ MODERATE  AV  STENOSIS WITH NO AR /  MILD LAE  . UMBILICAL HERNIA REPAIR  JUNE 2006  . VAGINAL HYSTERECTOMY  01-31-2001   ANTERIOR & POSTERIOR REPAIR/ TRANSVAGINAL BLADDER SLING    There were no vitals filed for this visit.   Subjective Assessment - 10/11/17 1310    Subjective  Pt reports general fatigue and shortness of breath with walking and sometimes even talking which started about 2 month. Pt is still performing her daily activities and housekeeping but with more frequent rests.    Currently in Pain?  No/denies         [facility] PT  Assessment - 10/11/17 0001      Assessment   Medical Diagnosis  severe aortic stenosis    Referring Provider  Dr. Sherren Mocha    Onset Date/Surgical Date  -- approximately 2 months ago      Precautions   Precautions  None      Restrictions   Weight Bearing Restrictions  No      Balance Screen   Has the patient fallen in the past 6 months  No    Has the patient had a decrease in activity level because of a fear of falling?   No    Is the patient reluctant to leave their home because of a fear of  falling?   No      Home Film/video editor residence    Living Arrangements  Spouse/significant other    Home Access  Stairs to enter    Entrance Stairs-Number of Steps  4    Entrance Stairs-Rails  Right;Left;Cannot reach both    [place]  Two level;Able to live on main level with bedroom/bathroom      Prior Function   Level of Independence  Independent with community mobility without device      Posture/Postural Control   Posture/Postural Control  Postural limitations    Postural Limitations  Rounded Shoulders;Forward head mild      ROM / Strength   AROM / PROM / Strength  AROM;Strength      AROM   Overall AROM Comments  grossly WNL       Strength   Overall Strength Comments  grossly 5/5 throughout    Strength Assessment Site  Hand    Right/Left hand  Right;Left    Right Hand Grip (lbs)  35 R hand dominant    Left Hand Grip (lbs)  42      Ambulation/Gait   Gait Comments  Pt ambulates without significant deviations but did have 1 episode of tripping due to poor foot clearance with self recovery. Gait distance limited by 8% for age/gender.        [facility] Pre-Surgical Assessment - 10/11/17 0001    5 Meter Walk Test- trial 1  4 sec    5 Meter Walk Test- trial 2  4 sec.     5 Meter Walk Test- trial 3  4 sec.    5 meter walk test average  4 sec    4 Stage Balance Test tolerated for:   8 sec.    4 Stage Balance Test Position  4    Sit To Stand Test- trial 1  11 sec.    ADL/IADL Independent with:  Bathing;Dressing;Meal prep;Finances    ADL/IADL Needs Assistance with:  [place] work    ADL/IADL Fraility Index  Vulnerable    6 Minute Walk- Baseline  yes    BP (mmHg)  116/60    HR (bpm)  60    02 Sat (%RA)  98 %    Modified Borg Scale for Dyspnea  0- Nothing at all    Perceived Rate of Exertion (Borg)  6-    6 Minute Walk Post Test  yes    BP (mmHg)  128/63    HR (bpm)  97    02 Sat (%RA)  96 %    Modified Borg Scale for Dyspnea  0- Nothing at all     Perceived Rate of Exertion (Borg)  13- Somewhat hard describes pulling or stress in chest    Aerobic  Endurance Distance Walked  1286              Objective measurements completed on examination: See above findings.                           Plan - 10/11/17 1350    Clinical Impression Statement  see below    PT Frequency  One time visit      Clinical Impression Statement: Pt is an 82 yo female presenting to OP PT for evaluation prior to possible TAVR surgery due to severe aortic stenosis. Pt reports onset of general fatigue and shortness of breath approximately 2 months ago. Symptoms are requiring her to rest much more frequently with household chores. Pt presents with good ROM and strength, good balance and is not at high fall risk 4 stage balance test, good walking speed and fair to good aerobic endurance per 6 minute walk test. Pt ambulated a total of 1286 feet in 6 minute walk. Pt did not report any perceived shortness of breath at the end of the test although based on my observation I would have rated it at least mild. She did report some chest discomfort which quickly resolved with test. Upon questioning, this is not something she has experienced before. Based on the Short Physical Performance Battery, patient has a frailty rating of 12/12 with </= 5/12 considered frail.   Patient demonstrated the following deficits and impairments:     Visit Diagnosis: Other abnormalities of gait and mobility - Plan: PT plan of care cert/re-cert     Problem List Patient Active Problem List   Diagnosis Date Noted  . Chronic fatigue 08/25/2017  . Primary osteoarthritis of both hands 08/06/2017  . Family history of lupus erythematosus 08/06/2017  . DDD (degenerative disc disease), cervical 07/08/2017  . Primary osteoarthritis of both knees 07/08/2017  . Bursitis of left shoulder 07/08/2017  . Fibromyalgia 07/08/2017  . Atherosclerosis of abdominal aorta ([place])  06/29/2017  . Mixed hyperlipidemia 03/19/2017  . Osteoporosis 04/21/2016  . Internal hemorrhoids with other complication 39/53/2023  . GI bleed 05/30/2013  . NSAID long-term use 05/30/2013  . Rectal bleeding 05/29/2013  . Severe aortic stenosis 05/29/2013  . ANEMIA-UNSPECIFIED 01/13/2010  . HEMORRHOIDS, EXTERNAL 01/13/2010  . DIVERTICULOSIS-COLON 01/13/2010  . CONSTIPATION 01/13/2010  . RECTAL BLEEDING 01/13/2010  . FIBROMYALGIA 01/13/2010  . PERSONAL HISTORY OF COLONIC POLYPS 01/13/2010  . Essential hypertension 08/28/2008  . GERD 08/28/2008  . PALPITATIONS 08/28/2008    NICOLETTA,DANA, PT 10/11/2017, 4:12 PM  [facility] [address] [place], [place], [address] Phone: [phone]   Fax:  [phone]  Name: MARIDEE SLAPE MRN: [id number] Date of Birth: 10-Nov-1935

## 2017-10-11 NOTE — H&P (View-Only) (Signed)
[facility]  CARDIOTHORACIC SURGERY CONSULTATION REPORT  Referring Provider is [name], MD PCP is [name], DO  Chief Complaint  Patient presents with  . Aortic Stenosis    TAVR evaluation    HPI:  The patient is an 82 year old woman with history of hypertension, hyperlipidemia, and fibromyalgia who is had some atypical chest pain in the past with a normal cath in 2008 and normal Myoview in 2009.  It was felt that her chest pain was most likely due to fibromyalgia.  She also has a history of aortic stenosis that has been followed with periodic echocardiograms.  Her echo in 02/2017 showed moderate to severe aortic stenosis with a mean gradient of 32 mmHg and a dimensionless index of 0.29.  There is trivial aortic insufficiency at that time.  Left ventricular ejection fraction was 60 to 65%.  She now reports that over the past several months she has had progressive exertional fatigue and shortness of breath.  She has always been quite active but recently has had to take frequent rest breaks.  She also reports some exertional chest pressure.  She has had no dizziness or syncope.  She has some chronic swelling in her left lower extremity that she has had for many years but denies any swelling in her right lower extremity.  She underwent a repeat echocardiogram on 09/07/2017 which showed progression of her aortic stenosis with a mean gradient of 40 mmHg and a peak gradient of 74 mmHg.  The dimensionless index was 0.26.  Left ventricular ejection fraction remained 65 to 70% with grade 1 diastolic dysfunction.  She underwent cardiac catheterization by Dr. Burt Knack on 09/29/2017 and this showed widely patent coronary arteries with minor luminal irregularities.  There is mild elevation of her right heart pressures with a PA pressure of 37/17 and mean wedge pressure of 20.  The patient is here by herself today but lives with her husband.   She is originally from [place] but is been in [place] since the 1960s.  They have 6 children.  She is retired.  She is a previous smoker but quit about 19 years ago.  Past Medical History:  Diagnosis Date  . Anemia   . Aortic stenosis   . Arthritis   . CAD (coronary artery disease) CARDIOLOGIST- DR Jenkins Rouge-- LAST VISIT IN [facility]  . Chronic headaches   . Diverticulosis of colon   . Fibromyalgia   . GERD (gastroesophageal reflux disease)   . Hemorrhoids    INTERNAL--  POST BANDING 02/ 2015  . History of adenomatous polyp of colon   . History of kidney stones   . Hyperlipidemia   . Hypertension   . Hypothyroidism, postsurgical   . Moderate aortic stenosis   . OSA (obstructive sleep apnea) CPAP INTOLERANT   . Osteoporosis   . Spinal stenosis, multilevel   . Spondylosis, cervical     Past Surgical History:  Procedure Laterality Date  . ANTERIOR CERVICAL DECOMP/DISCECTOMY FUSION  11-11-1999   C5 - C6  . APPENDECTOMY  AGE 60  . CARDIAC CATHETERIZATION  2008  DR [facility]   ESSENTIALLY NORMAL  . CATARACT EXTRACTION W/ INTRAOCULAR LENS  IMPLANT, BILATERAL  2012  . FLEXIBLE SIGMOIDOSCOPY N/A 06/01/2013   Procedure: FLEXIBLE SIGMOIDOSCOPY;  Surgeon: Inda Castle, MD;  Location: [facility];  Service: Endoscopy;  Laterality: N/A;  may need hemorrhoidal banding  . HEMORRHOIDECTOMY WITH HEMORRHOID BANDING  08-07-2010  . LAPAROSCOPIC  CHOLECYSTECTOMY  1990  . RIGHT/LEFT HEART CATH AND CORONARY ANGIOGRAPHY N/A 09/29/2017   Procedure: RIGHT/LEFT HEART CATH AND CORONARY ANGIOGRAPHY;  Surgeon: Sherren Mocha, MD;  Location: [facility];  Service: Cardiovascular;  Laterality: N/A;  . SHOULDER ARTHROSCOPY WITH OPEN ROTATOR CUFF REPAIR AND DISTAL CLAVICLE ACROMINECTOMY Right 05/25/2012   Procedure: SHOULDER ARTHROSCOPY WITH OPEN ROTATOR CUFF REPAIR AND DISTAL CLAVICLE ACROMINECTOMY;  Surgeon: Magnus Sinning, MD;  Location: [facility];  Service: Orthopedics;   Laterality: Right;  RIGHT SHOULDER ARTHROSCOPY WITH DERBRIDEMENTOF LABRAL/BICEP TENDON, OPEN DISTAL CLAVICLE RESECTION, ANTERIOR ACROMINECTOMY ROTATOR CUFF REPAIR ANESTHESIA: GENERAL/SCALENE NERVE BLOCK  . TENOSYNOVECTOMY Left 01/04/2014   Procedure: LEFT WRIST EXTENSOR TENOSYNOVECTOMY;  Surgeon: Linna Hoff, MD;  Location: [facility];  Service: Orthopedics;  Laterality: Left;  . THYROIDECTOMY  AGE 38   GOITER  . TONSILLECTOMY AND ADENOIDECTOMY  AGE 41  . TRANSTHORACIC ECHOCARDIOGRAM  last one 04-20-2013  DR [facility]    NORMAL LVSF/ EF 60-65%/ MODERATE  AV  STENOSIS WITH NO AR /  MILD LAE  . UMBILICAL HERNIA REPAIR  JUNE 2006  . VAGINAL HYSTERECTOMY  01-31-2001   ANTERIOR & POSTERIOR REPAIR/ TRANSVAGINAL BLADDER SLING    Family History  Problem Relation Age of Onset  . Cirrhosis Mother        drinking  . Other Brother        duodenal ulcer  . Heart disease Sister   . Cancer Son 37       colon  . Gallbladder disease Maternal Grandmother   . Stomach cancer Paternal Grandfather     Social History   Socioeconomic History  . Marital status: Married    Spouse name: Not on file  . Number of children: 3  . Years of education: Not on file  . Highest education level: Not on file  Occupational History  . Not on file  Social Needs  . Financial resource strain: Not on file  . Food insecurity:    Worry: Not on file    Inability: Not on file  . Transportation needs:    Medical: Not on file    Non-medical: Not on file  Tobacco Use  . Smoking status: Former Smoker    Packs/day: 0.25    Years: 5.00    Pack years: 1.25    Types: Cigarettes    Last attempt to quit: 04/14/2003    Years since quitting: 14.5  . Smokeless tobacco: Never Used  Substance and Sexual Activity  . Alcohol use: Yes    Comment: once in a while-social  . Drug use: Never    Comment: hemp oil   . Sexual activity: Not Currently  Lifestyle  . Physical activity:    Days per week: Not on file     Minutes per session: Not on file  . Stress: Not on file  Relationships  . Social connections:    Talks on phone: Not on file    Gets together: Not on file    Attends religious service: Not on file    Active member of club or organization: Not on file    Attends meetings of clubs or organizations: Not on file    Relationship status: Not on file  . Intimate partner violence:    Fear of current or ex partner: Not on file    Emotionally abused: Not on file    Physically abused: Not on file    Forced sexual activity: Not on file  Other Topics Concern  . Not on file  Social History Narrative  . Not on file    Current Outpatient Medications  Medication Sig Dispense Refill  . alendronate (FOSAMAX) 70 MG tablet Take 1 tablet (70 mg total) by mouth every 7 (seven) days. Take with a full glass of water on an empty stomach. 4 tablet 11  . Aloe Vera 25 MG CAPS Take 25 mg by mouth 2 (two) times daily.     Marland Kitchen aspirin 81 MG tablet Take 81 mg by mouth 2 (two) times daily.     Marland Kitchen BEE POLLEN PO Take 1 capsule by mouth daily.     . clobetasol cream (TEMOVATE) 0.93 % Apply 1 application topically 2 (two) times daily as needed. (Patient taking differently: Apply 1 application topically 2 (two) times daily as needed (for rash). ) 30 g 3  . metoprolol tartrate (LOPRESSOR) 50 MG tablet Take 1 tablet (50 mg total) by mouth 2 (two) times daily. 180 tablet 1  . OVER THE COUNTER MEDICATION Take 1 capsule by mouth 2 (two) times daily. Hemp Oil    . OVER THE COUNTER MEDICATION Take 1 capsule by mouth daily. Blood Builder Supplement     . Polyethylene Glycol 400 (BLINK TEARS) 0.25 % SOLN Place 1-2 drops into both eyes daily as needed (for dry eyes).    . rosuvastatin (CRESTOR) 5 MG tablet Take 1 tablet (5 mg total) by mouth daily. 90 tablet 1  . TURMERIC PO Take 1 capsule by mouth daily.     No current facility-administered medications for this visit.    Facility-Administered Medications Ordered in Other Visits    Medication Dose Route Frequency Provider Last Rate Last Dose  . iopamidol (ISOVUE-370) 76 % injection             Allergies  Allergen Reactions  . Tape Other (See Comments)    Dermatitis rash "with extended exposure"  . Prolia [Denosumab] Other (See Comments)    Arthralgia/ myalgia/ jaw pain/ headache      Review of Systems:   General:  normal appetite, +decreased energy, no weight gain, no weight loss, no fever  Cardiac:  no chest pain with exertion, no chest pain at rest, +SOB with mild exertion, no resting SOB, no PND, no orthopnea, no palpitations, no arrhythmia, no atrial fibrillation, chronic left LE edema, no dizzy spells, no syncope  Respiratory:  + exertional shortness of breath, no home oxygen, no productive cough, + dry cough, no bronchitis, no wheezing, no hemoptysis, no asthma, no pain with inspiration or cough, + sleep apnea, does not use CPAP at night because she feels like she can't breath.  GI:   no difficulty swallowing, no reflux, no frequent heartburn, no hiatal hernia, no abdominal pain, no constipation, no diarrhea, no hematochezia, no hematemesis, no melena  GU:   no dysuria,  no frequency, no urinary tract infection, no hematuria, no kidney stones, + kidney disease  Vascular:  no pain suggestive of claudication, + pain in feet, no leg cramps, no varicose veins, no DVT, no non-healing foot ulcer  Neuro:   no stroke, no TIA's, no seizures, no headaches, no temporary blindness one eye,  no slurred speech, + peripheral neuropathy, + chronic pain, no instability of gait, no memory/cognitive dysfunction  Musculoskeletal: + arthritis, no joint swelling, + myalgias, no difficulty walking, normal mobility   Skin:   no rash, no itching, no skin infections, no pressure sores or ulcerations  Psych:   no anxiety,  no depression, no nervousness, no unusual recent stress  Eyes:   no blurry vision, no floaters, no recent vision changes, does not wear glasses or contacts  ENT:   no  hearing loss, no loose or painful teeth, no dentures, last saw dentist recently and sees every 6 months.  Hematologic:  no easy bruising, no abnormal bleeding, no clotting disorder, no frequent epistaxis  Endocrine:  no diabetes, does not check CBG's at home       Physical Exam:   BP 108/62 (BP Location: Left Arm, Patient Position: Sitting, Cuff Size: Normal)   Pulse (!) 59   Resp 18   Ht 5\' 2"  (1.575 m)   Wt 157 lb 9.6 oz (71.5 kg)   SpO2 96% Comment: RA  BMI 28.83 kg/m   General:  Elderly but well-appearing, looks good for her age  5:  Unremarkable, NCAT, PERLA, EOMI, oropharynx clear  Neck:   no JVD, no bruits, no adenopathy or thyromegaly  Chest:   clear to auscultation, symmetrical breath sounds, no wheezes, no rhonchi   CV:   RRR, grade III/VI crescendo/decrescendo murmur heard best at RSB,  no diastolic murmur  Abdomen:  soft, non-tender, no masses or organomegaly  Extremities:  warm, well-perfused, pulses palpable in feet, no LE edema  Rectal/GU  Deferred  Neuro:   Grossly non-focal and symmetrical throughout  Skin:   Clean and dry, no rashes, no breakdown   Diagnostic Tests:     [facility]                        [address], [place] [address]                            [phone]  ------------------------------------------------------------------- Transthoracic Echocardiography  Patient:    Apurva, Reily MR #:       [id number] Study Date: 09/07/2017 Gender:     F Age:        56 Height:     157.5 cm Weight:     70.8 kg BSA:        1.78 m^2 Pt. Status: Room:   Jetty Duhamel, M.D.  REFERRING    Jenkins Rouge, M.D.  ATTENDING    Kirk Ruths  SONOGRAPHER  Wyatt Mage, RDCS  PERFORMING   [facility], Outpatient  cc:  ------------------------------------------------------------------- LV EF: 65% -   70%  ------------------------------------------------------------------- Indications:       Aortic stenosis (I35).  ------------------------------------------------------------------- History:   PMH:   Aortic valve disease.  Risk factors:  Anemia. Palpitation. Hypertension. Dyslipidemia.  ------------------------------------------------------------------- Study Conclusions  - Left ventricle: The cavity size was normal. There was mild focal   basal hypertrophy of the septum. Systolic function was vigorous.   The estimated ejection fraction was in the range of 65% to 70%.   Wall motion was normal; there were no regional wall motion   abnormalities. Doppler parameters are consistent with abnormal   left ventricular relaxation (grade 1 diastolic dysfunction).   Doppler parameters are consistent with high ventricular filling   pressure. - Aortic valve: Valve mobility was restricted. There was severe   stenosis. There was trivial regurgitation. Mean gradient (S): 40   mm Hg. Peak gradient (S): 74 mm Hg. - Mitral  valve: Calcified annulus. There was mild regurgitation. - Pulmonary arteries: Systolic pressure was mildly increased. PA   peak pressure: 35 mm Hg (S).  Impressions:  - Vigorous LV systolic function; mild diastolic dysfunction;   proximal septal thickening; calcified aortic valve with severe AS   and trace AI; mild MR; trace TR wlith mild pulmonary   hypertension.  ------------------------------------------------------------------- Labs, prior tests, procedures, and surgery: Transthoracic echocardiography (02/16/2017).    The aortic valve showed moderate to severe stenosis.  EF was 60%. Aortic valve: peak gradient of 58 mm Hg and mean gradient of 32 mm Hg.  ------------------------------------------------------------------- Study data:  No prior study was available for comparison.  Study status:  Routine.  Procedure:  The patient reported no pain pre or post test. Transthoracic echocardiography. Image quality was adequate.  Study completion:  There were no  complications. Transthoracic echocardiography.  M-mode, complete 2D, 3D, spectral Doppler, and color Doppler.  Birthdate:  Patient birthdate: [birth year].  Age:  Patient is 82 yr old.  Sex:  Gender: female. BMI: 28.5 kg/m^2.  Blood pressure:     143/65  Patient status: Outpatient.  Study date:  Study date: 09/07/2017. Study time: 12:24 PM.  Location:  [facility]  -------------------------------------------------------------------  ------------------------------------------------------------------- Left ventricle:  The cavity size was normal. There was mild focal basal hypertrophy of the septum. Systolic function was vigorous. The estimated ejection fraction was in the range of 65% to 70%. Wall motion was normal; there were no regional wall motion abnormalities. Doppler parameters are consistent with abnormal left ventricular relaxation (grade 1 diastolic dysfunction). Doppler parameters are consistent with high ventricular filling pressure.   ------------------------------------------------------------------- Aortic valve:   Severely calcified leaflets. Valve mobility was restricted.  Doppler:   There was severe stenosis.   There was trivial regurgitation.    VTI ratio of LVOT to aortic valve: 0.24. Valve area (VTI): 0.76 cm^2. Indexed valve area (VTI): 0.43 cm^2/m^2. Peak velocity ratio of LVOT to aortic valve: 0.26. Valve area (Vmax): 0.8 cm^2. Indexed valve area (Vmax): 0.45 cm^2/m^2. Mean velocity ratio of LVOT to aortic valve: 0.24. Valve area (Vmean): 0.74 cm^2. Indexed valve area (Vmean): 0.42 cm^2/m^2. Mean gradient (S): 40 mm Hg. Peak gradient (S): 74 mm Hg.  ------------------------------------------------------------------- Aorta:  Aortic root: The aortic root was normal in size.  ------------------------------------------------------------------- Mitral valve:   Calcified annulus. Mobility was not restricted. Doppler:  Transvalvular velocity was within the  normal range. There was no evidence for stenosis. There was mild regurgitation. Valve area by pressure half-time: 1.82 cm^2. Indexed valve area by pressure half-time: 1.02 cm^2/m^2.    Peak gradient (D): 4 mm Hg.   ------------------------------------------------------------------- Left atrium:  The atrium was normal in size.  ------------------------------------------------------------------- Right ventricle:  The cavity size was normal. Systolic function was normal.  ------------------------------------------------------------------- Pulmonic valve:    Doppler:  Transvalvular velocity was within the normal range. There was no evidence for stenosis.  ------------------------------------------------------------------- Tricuspid valve:   Structurally normal valve.    Doppler: Transvalvular velocity was within the normal range. There was trivial regurgitation.  ------------------------------------------------------------------- Pulmonary artery:   Systolic pressure was mildly increased.  ------------------------------------------------------------------- Right atrium:  The atrium was normal in size.  ------------------------------------------------------------------- Pericardium:  There was no pericardial effusion.  ------------------------------------------------------------------- Systemic veins: Inferior vena cava: The vessel was normal in size.  ------------------------------------------------------------------- Measurements   Left ventricle  Value          Reference  LV ID, ED, PLAX chordal           (L)     33    mm       43 - 52  LV ID, ES, PLAX chordal                   23    mm       23 - 38  LV fx shortening, PLAX chordal            30    %        >=29  LV PW thickness, ED                       7.94  mm       ---------  IVS/LV PW ratio, ED               (H)     1.71           <=1.3  Stroke volume, 2D                         78     ml       ---------  Stroke volume/bsa, 2D                     44    ml/m^2   ---------  LV e&', lateral                            5.27  cm/s     ---------  LV E/e&', lateral                          17.89          ---------  LV e&', medial                             5.22  cm/s     ---------  LV E/e&', medial                           18.07          ---------  LV e&', average                            5.25  cm/s     ---------  LV E/e&', average                          17.98          ---------    Ventricular septum                        Value          Reference  IVS thickness, ED                         13.59 mm       ---------    LVOT  Value          Reference  LVOT ID, S                                20    mm       ---------  LVOT area                                 3.14  cm^2     ---------  LVOT peak velocity, S                     110   cm/s     ---------  LVOT mean velocity, S                     68.6  cm/s     ---------  LVOT VTI, S                               24.8  cm       ---------    Aortic valve                              Value          Reference  Aortic valve peak velocity, S             431   cm/s     ---------  Aortic valve mean velocity, S             290   cm/s     ---------  Aortic valve VTI, S                       103   cm       ---------  Aortic mean gradient, S                   40    mm Hg    ---------  Aortic peak gradient, S                   74    mm Hg    ---------  VTI ratio, LVOT/AV                        0.24           ---------  Aortic valve area, VTI                    0.76  cm^2     ---------  Aortic valve area/bsa, VTI                0.43  cm^2/m^2 ---------  Velocity ratio, peak, LVOT/AV             0.26           ---------  Aortic valve area, peak velocity          0.8   cm^2     ---------  Aortic valve area/bsa, peak               0.45  cm^2/m^2 ---------  velocity  Velocity ratio, mean, LVOT/AV  0.24           ---------  Aortic valve area, mean velocity          0.74  cm^2     ---------  Aortic valve area/bsa, mean               0.42  cm^2/m^2 ---------  velocity    Aorta                                     Value          Reference  Aortic root ID, ED                        30    mm       ---------  Ascending aorta ID, A-P, S                28    mm       ---------    Left atrium                               Value          Reference  LA ID, A-P, ES                            40    mm       ---------  LA ID/bsa, A-P                    (H)     2.25  cm/m^2   <=2.2  LA volume, S                              42.3  ml       ---------  LA volume/bsa, S                          23.7  ml/m^2   ---------  LA volume, ES, 1-p A4C                    34.6  ml       ---------  LA volume/bsa, ES, 1-p A4C                19.4  ml/m^2   ---------  LA volume, ES, 1-p A2C                    46.7  ml       ---------  LA volume/bsa, ES, 1-p A2C                26.2  ml/m^2   ---------    Mitral valve                              Value          Reference  Mitral E-wave peak velocity               94.3  cm/s     ---------  Mitral A-wave peak velocity  143   cm/s     ---------  Mitral deceleration time          (H)     412   ms       150 - 230  Mitral pressure half-time                 121   ms       ---------  Mitral peak gradient, D                   4     mm Hg    ---------  Mitral E/A ratio, peak                    0.7            ---------  Mitral valve area, PHT, DP                1.82  cm^2     ---------  Mitral valve area/bsa, PHT, DP            1.02  cm^2/m^2 ---------    Pulmonary arteries                        Value          Reference  PA pressure, S, DP                (H)     35    mm Hg    <=30    Tricuspid valve                           Value          Reference  Tricuspid regurg peak velocity            258   cm/s     ---------  Tricuspid peak RV-RA gradient              27    mm Hg    ---------    Systemic veins                            Value          Reference  Estimated CVP                             8     mm Hg    ---------    Right ventricle                           Value          Reference  TAPSE                                     18.3  mm       ---------  RV pressure, S, DP                (H)     35    mm Hg    <=30  RV s&', lateral, S  11.9  cm/s     ---------  Legend: (L)  and  (H)  mark values outside specified reference range.  ------------------------------------------------------------------- Prepared and Electronically Authenticated by  Kirk Ruths 2019-05-28T13:13:01   Physicians   Panel Physicians Referring Physician Case Authorizing Physician  Sherren Mocha, MD (Primary)    Procedures   RIGHT/LEFT HEART CATH AND CORONARY ANGIOGRAPHY  Conclusion   1.  Known severe aortic stenosis by noninvasive assessment with heavily calcified and restricted aortic valve leaflets biplane fluoroscopy 2.  Heavy mitral annular calcium 3.  Widely patent coronary arteries with minor luminal irregularities 4.  Mildly elevated right heart pressures   Recommendation: Continued multidisciplinary heart team evaluation for treatment of severe aortic stenosis  Indications   Severe aortic stenosis [I35.0 (ICD-10-CM)]  Procedural Details/Technique   Technical Details INDICATION: Severe aortic stenosis  PROCEDURAL DETAILS: There was an indwelling IV in a right antecubital vein. Attempts were made to change out the IV but a wire would not pass into the vein in order to change the IV out for sheath. Multiple attempts were made. Attention was then turned to the right groin and ultrasound guidance is used. Ultrasound images are captured and stored in the patient's paper chart. The femoral vein is accessed via a front wall puncture under direct ultrasound guidance and a 7 French sheath is placed. The right wrist was then  prepped, draped, and anesthetized with 1% lidocaine. Using direct ultrasound guidance a 5/6 French Slender sheath was placed in the right radial artery. Intra-arterial verapamil was administered through the radial artery sheath. IV heparin was administered after a JR4 catheter was advanced into the central aorta. A Swan-Ganz catheter was used for the right heart catheterization. Standard protocol was followed for recording of right heart pressures and sampling of oxygen saturations. Fick cardiac output was calculated. Standard Judkins catheters were used for selective coronary angiography. The patient has significant subclavian tortuosity and catheter advancement into the central aorta is difficult. Attempts are not made to cross the aortic valve as the patient has documented severe aortic stenosis by echo assessment. There were no immediate procedural complications. The patient was transferred to the post catheterization recovery area for further monitoring.     Estimated blood loss <50 mL.  During this procedure the patient was administered the following to achieve and maintain moderate conscious sedation: Versed 2 mg, Fentanyl 50 mcg, while the patient's heart rate, blood pressure, and oxygen saturation were continuously monitored. The period of conscious sedation was 68 minutes, of which I was present face-to-face 100% of this time.  Coronary Findings   Diagnostic  Dominance: Right  Left Anterior Descending  The vessel exhibits minimal luminal irregularities. The LAD is a normal caliber vessel. It wraps around the LV apex. There are mild irregularities in the midportion of the vessel with no significant stenosis.  Left Circumflex  Vessel is large. The circumflex is large in caliber. The vessel supplies a large obtuse marginal branch. There is no stenosis throughout the circumflex distribution.  Right Coronary Artery  Vessel is moderate in size. The vessel exhibits minimal luminal irregularities.  Widely patent vessel, medium in caliber, with some calcification at the ostium but no significant stenosis throughout  Intervention   No interventions have been documented.  Left Heart   Mitral Valve The annulus is calcified. The mitral annulus is heavily calcified.  Aortic Valve There is severe aortic valve stenosis. The aortic valve is calcified. There is restricted aortic valve motion. The aortic valve was not crossed.  The aortic valve is severely calcified with restricted leaflet mobility visualized on plain fluoroscopy.  Coronary Diagrams   Diagnostic Diagram       Implants    No implant documentation for this case.  MERGE Images   Show images for CARDIAC CATHETERIZATION   Link to Procedure Log   Procedure Log    Hemo Data    Most Recent Value  RA A Wave 15 mmHg  RA V Wave 12 mmHg  RA Mean 11 mmHg  RV Systolic Pressure 42 mmHg  RV Diastolic Pressure 11 mmHg  RV EDP 17 mmHg  PA Systolic Pressure 37 mmHg  PA Diastolic Pressure 17 mmHg  PA Mean 27 mmHg  PW A Wave 22 mmHg  PW V Wave 22 mmHg  PW Mean 20 mmHg  AO Systolic Pressure 272 mmHg  AO Diastolic Pressure 64 mmHg  AO Mean 96 mmHg   STS Adult Cardiac Surgery Database Version 2.9   Procedure: Isolated AVR   Risk of Mortality: 2.070%  Renal Failure: 0.914%  Permanent Stroke: 1.473%  Prolonged Ventilation: 5.848%  DSW Infection: 0.067%  Reoperation: 2.933%  Morbidity or Mortality: 9.535%  Short Length of Stay: 39.324%  Long Length of Stay: 4.097%    ADDENDUM REPORT: 10/11/2017 12:56  CLINICAL DATA:  Aortic stenosis  EXAM: Cardiac TAVR CT  TECHNIQUE: The patient was scanned on a [employer] 192 scanner. A 120 kV retrospective scan was triggered in the descending thoracic aorta at 111 HU's. Gantry rotation speed was 270 msecs and collimation was .9 mm. No beta blockade or nitro were given. The 3D data set was reconstructed in 5% intervals of the R-R cycle. Systolic and diastolic phases were  analyzed on a dedicated work station using MPR, MIP and VRT modes. The patient received 80 cc of contrast.  FINDINGS: Aortic Valve: Calcified and tri leaflet with small annulus  Aorta: Moderate calcific aortic atherosclerosis with normal arch vessel take offs.  Sinotubular Junction: 24 mm  Ascending Thoracic Aorta: 31 mm  Aortic Arch: 25 mm  Descending Thoracic Aorta: 21 mm  Sinus of Valsalva Measurements:  Non-coronary: 27.8 mm  Right - coronary: 24.9 mm  Left - coronary: 25.2 mm  Coronary Artery Height above Annulus:  Left Main: 12.2 mm above annulus  Right Coronary: 15 mm above annulus  Virtual Basal Annulus Measurements:  Maximum/Minimum Diameter: 23.5 mm x 17.1 mm  Perimeter: 65.2 mm  Area: 317 mm2  Coronary Arteries: Coronary arteries sufficient height above annulus for deployment  Optimum Fluoroscopic Angle for Delivery: LAO 9 Caudal 17 degrees  IMPRESSION: 1. Calcified tri-leaflet AV with small annulus only suitable for a 20 mm Sapien 3 valve  2. Optimum angiographic angle for deployment LAO 9 Caudal 17 degrees  3.  Coronary arteries sufficient height above annulus for deployment  4.  No LAA thrombus  Jenkins Rouge   Electronically Signed   By: Jenkins Rouge M.D.   On: 10/11/2017 12:56   Addended by [name], MD on 10/11/2017 12:59 PM    Study Result   EXAM: OVER-READ INTERPRETATION  CT CHEST  The following report is an over-read performed by radiologist Dr. Vinnie Langton of [facility], [place] on 10/11/2017. This over-read does not include interpretation of cardiac or coronary anatomy or pathology. The coronary calcium score/coronary CTA interpretation by the cardiologist is attached.  COMPARISON:  None.  FINDINGS: Extracardiac findings will be described separately under dictation for contemporaneously obtained CTA chest, abdomen and pelvis.  IMPRESSION: Please see separate  dictation for  contemporaneously obtained CTA chest, abdomen and pelvis dated 10/11/2017 for full description of relevant extracardiac findings.  Electronically Signed: By: Vinnie Langton M.D. On: 10/11/2017 11:05       CLINICAL DATA:  82 year old female with history of severe aortic stenosis. Preprocedural study prior to potential transcatheter aortic valve replacement (TAVR) procedure.  EXAM: CT ANGIOGRAPHY CHEST, ABDOMEN AND PELVIS  TECHNIQUE: Multidetector CT imaging through the chest, abdomen and pelvis was performed using the standard protocol during bolus administration of intravenous contrast. Multiplanar reconstructed images and MIPs were obtained and reviewed to evaluate the vascular anatomy.  CONTRAST:  46mL ISOVUE-370 IOPAMIDOL (ISOVUE-370) INJECTION 76%  COMPARISON:  No prior chest CT. CT the abdomen and pelvis 06/11/2015.  FINDINGS: CTA CHEST FINDINGS  Cardiovascular: Heart size is normal. There is no significant pericardial fluid, thickening or pericardial calcification. Aortic atherosclerosis. No definite coronary artery calcifications. Severe thickening calcification of the aortic valve. Severe calcifications of the mitral annulus.  Mediastinum/Lymph Nodes: No pathologically enlarged mediastinal or hilar lymph nodes. Esophagus is unremarkable in appearance. No axillary lymphadenopathy.  Lungs/Pleura: 4 mm right lower lobe nodule (axial image 74 of series 15). No other larger more suspicious appearing pulmonary nodules or masses are noted. No acute consolidative airspace disease. No pleural effusions.  Musculoskeletal/Soft Tissues: Orthopedic fixation hardware in the lower cervical spine incompletely imaged. There are no aggressive appearing lytic or blastic lesions noted in the visualized portions of the skeleton.  CTA ABDOMEN AND PELVIS FINDINGS  Hepatobiliary: No suspicious cystic or solid hepatic lesions. No intra or extrahepatic biliary  ductal dilatation. Status post cholecystectomy.  Pancreas: No pancreatic mass. No pancreatic ductal dilatation. No pancreatic or peripancreatic fluid or inflammatory changes.  Spleen: Unremarkable.  Adrenals/Urinary Tract: Numerous subcentimeter low-attenuation lesions in both kidneys, too small to characterize, but statistically likely to represent cysts. 1.3 cm intermediate attenuation (42 HU) lesion at the junction of upper and interpolar region of the left kidney, not characterized but favored to represent a proteinaceous/hemorrhagic cyst. No hydroureteronephrosis. Urinary bladder is normal in appearance. Bilateral adrenal glands are normal in appearance.  Stomach/Bowel: Normal appearance of the stomach. No pathologic dilatation of small bowel or colon. Numerous colonic diverticulae are noted, particularly in the descending colon and sigmoid colon, without surrounding inflammatory changes to suggest an acute diverticulitis at this time. The appendix is not confidently identified and may be surgically absent. Regardless, there are no inflammatory changes noted adjacent to the cecum to suggest the presence of an acute appendicitis at this time.  Vascular/Lymphatic: Aortic atherosclerosis, without evidence of aneurysm or dissection in the abdominal or pelvic vasculature. Vascular findings and measurements pertinent to potential TAVR procedure, as detailed below. Celiac axis, superior mesenteric artery and inferior mesenteric artery are all widely patent without hemodynamically significant stenosis. Single renal arteries bilaterally are patent without hemodynamically significant stenosis. No lymphadenopathy noted in the abdomen or pelvis.  Reproductive: Status post hysterectomy.  Ovaries are atrophic.  Other: No significant volume of ascites.  No pneumoperitoneum.  Musculoskeletal: There are no aggressive appearing lytic or blastic lesions noted in the visualized  portions of the skeleton.  VASCULAR MEASUREMENTS PERTINENT TO TAVR:  AORTA:  Minimal Aortic Diameter-9 x 9 mm  Severity of Aortic Calcification-severe  RIGHT PELVIS:  Right Common Iliac Artery -  Minimal Diameter-8.7 x 5.5 mm  Tortuosity-moderate  Calcification-mild  Right External Iliac Artery -  Minimal Diameter-5.6 x 5.5 mm  Tortuosity-mild  Calcification-none  Right Common Femoral Artery -  Minimal Diameter-6.2 x 6.9 mm  Tortuosity-mild  Calcification-mild  LEFT PELVIS:  Left Common Iliac Artery -  Minimal Diameter-7.0 x 5.4 mm  Tortuosity-mild  Calcification-mild-to-moderate  Left External Iliac Artery -  Minimal Diameter-6.2 x 5.7 mm  Tortuosity-mild  Calcification-none  Left Common Femoral Artery -  Minimal Diameter-6.1 x 6.8 mm  Tortuosity-mild  Calcification-mild  Review of the MIP images confirms the above findings.  IMPRESSION: 1. Aortic atherosclerosis with vascular findings and measurements pertinent to potential TAVR procedure, as detailed above. 2. Severe thickening calcification of the aortic valve, compatible with the reported clinical history of severe aortic stenosis. 3.  There are also severe calcifications of the mitral annulus. 4. 4 mm right lower lobe pulmonary nodule. This is nonspecific, but statistically likely benign. No follow-up needed if patient is low-risk. Non-contrast chest CT can be considered in 12 months if patient is high-risk. This recommendation follows the consensus statement: Guidelines for Management of Incidental Pulmonary Nodules Detected on CT Images: From the Fleischner Society 2017; Radiology 2017; 284:228-243. 5. Severe colonic diverticulosis without evidence of acute diverticulitis at this time. 6. Additional incidental findings, as above.  Aortic Atherosclerosis (ICD10-I70.0).   Electronically Signed   By: Vinnie Langton M.D.   On: 10/11/2017  12:42  Impression:  This 82 year old woman has stage D, severe, symptomatic aortic stenosis with New York Heart Association class II symptoms of exertional fatigue and shortness of breath that are limiting her activity level.  She is also been having chest pressure with exertion.  I agree that aortic valve replacement is indicated in this patient to improve her quality of life and prevent left ventricular deterioration.  I have personally reviewed her 2D echocardiogram, cardiac catheterization, and CTA studies.  Her echocardiogram shows a trileaflet aortic valve with severe calcification of the leaflets and restricted mobility.  The mean gradient is 40 mmHg consistent with severe aortic stenosis.  Left ventricular systolic function is normal.  Her cardiac catheterization shows no significant coronary disease.  Her operative risk for open surgical aortic valve replacement would be moderately elevated given her advanced age and her relatively small annulus which would likely result in the need for annular enlargement or aortic root replacement.  She says that she is not interested in open surgical aortic valve replacement under any circumstances.  I think transcatheter aortic valve replacement would be a reasonable alternative for her.  Her gated cardiac CTA shows anatomy suitable for transcatheter aortic valve replacement although she has a relatively small annulus that sizes to a 20 mm Sapien 3 valve.  Her BSA is 1.78.  Her abdominal and pelvic CTA shows pelvic arterial anatomy suitable for transfemoral insertion of a Sapien 3 valve.  We will need to discuss her case with the multidisciplinary heart valve team to decide which type of transcatheter aortic valve to use.  Her iliofemoral vessels are not large enough for insertion of a [employer]onic Evolut valve through a separate sheath.  The patient was counseled at length regarding treatment alternatives for management of severe symptomatic aortic stenosis. The  risks and benefits of surgical intervention has been discussed in detail. Long-term prognosis with medical therapy was discussed. Alternative approaches such as conventional surgical aortic valve replacement, transcatheter aortic valve replacement, and palliative medical therapy were compared and contrasted at length. This discussion was placed in the context of the patient's own specific clinical presentation and past medical history. All of her questions have been addressed.   Following the decision to proceed with transcatheter aortic valve replacement, a discussion was held  regarding what types of management strategies would be attempted intraoperatively in the event of life-threatening complications, including whether or not the patient would be considered a candidate for the use of cardiopulmonary bypass and/or conversion to open sternotomy for attempted surgical intervention. The patient has been advised of a variety of complications that might develop including but not limited to risks of death, stroke, paravalvular leak, aortic dissection or other major vascular complications, aortic annulus rupture, device embolization, cardiac rupture or perforation, mitral regurgitation, acute myocardial infarction, arrhythmia, heart block or bradycardia requiring permanent pacemaker placement, congestive heart failure, respiratory failure, renal failure, pneumonia, infection, other late complications related to structural valve deterioration or migration, or other complications that might ultimately cause a temporary or permanent loss of functional independence or other long term morbidity. The patient provides full informed consent for the procedure as described and all questions were answered.    Plan:  Her case will be discussed with the multidisciplinary heart valve team and we will tentatively plan to proceed with transfemoral  transcatheter aortic valve replacement on Tuesday, 10/19/2017.  I spent 60  minutes performing this consultation and > 50% of this time was spent face to face counseling and coordinating the care of this patient's severe symptomatic aortic stenosis.    Gaye Pollack, MD 10/11/2017 5:32 PM

## 2017-10-11 NOTE — Progress Notes (Signed)
[facility]  CARDIOTHORACIC SURGERY CONSULTATION REPORT  Referring Provider is [name], MD PCP is [name], DO  Chief Complaint  Patient presents with  . Aortic Stenosis    TAVR evaluation    HPI:  The patient is an 82 year old woman with history of hypertension, hyperlipidemia, and fibromyalgia who is had some atypical chest pain in the past with a normal cath in 2008 and normal Myoview in 2009.  It was felt that her chest pain was most likely due to fibromyalgia.  She also has a history of aortic stenosis that has been followed with periodic echocardiograms.  Her echo in 02/2017 showed moderate to severe aortic stenosis with a mean gradient of 32 mmHg and a dimensionless index of 0.29.  There is trivial aortic insufficiency at that time.  Left ventricular ejection fraction was 60 to 65%.  She now reports that over the past several months she has had progressive exertional fatigue and shortness of breath.  She has always been quite active but recently has had to take frequent rest breaks.  She also reports some exertional chest pressure.  She has had no dizziness or syncope.  She has some chronic swelling in her left lower extremity that she has had for many years but denies any swelling in her right lower extremity.  She underwent a repeat echocardiogram on 09/07/2017 which showed progression of her aortic stenosis with a mean gradient of 40 mmHg and a peak gradient of 74 mmHg.  The dimensionless index was 0.26.  Left ventricular ejection fraction remained 65 to 70% with grade 1 diastolic dysfunction.  She underwent cardiac catheterization by Dr. Burt Knack on 09/29/2017 and this showed widely patent coronary arteries with minor luminal irregularities.  There is mild elevation of her right heart pressures with a PA pressure of 37/17 and mean wedge pressure of 20.  The patient is here by herself today but lives with her husband.   She is originally from [place] but is been in [place] since the 1960s.  They have 6 children.  She is retired.  She is a previous smoker but quit about 19 years ago.  Past Medical History:  Diagnosis Date  . Anemia   . Aortic stenosis   . Arthritis   . CAD (coronary artery disease) CARDIOLOGIST- DR Jenkins Rouge-- LAST VISIT IN [facility]  . Chronic headaches   . Diverticulosis of colon   . Fibromyalgia   . GERD (gastroesophageal reflux disease)   . Hemorrhoids    INTERNAL--  POST BANDING 02/ 2015  . History of adenomatous polyp of colon   . History of kidney stones   . Hyperlipidemia   . Hypertension   . Hypothyroidism, postsurgical   . Moderate aortic stenosis   . OSA (obstructive sleep apnea) CPAP INTOLERANT   . Osteoporosis   . Spinal stenosis, multilevel   . Spondylosis, cervical     Past Surgical History:  Procedure Laterality Date  . ANTERIOR CERVICAL DECOMP/DISCECTOMY FUSION  11-11-1999   C5 - C6  . APPENDECTOMY  AGE 37  . CARDIAC CATHETERIZATION  2008  DR [facility]   ESSENTIALLY NORMAL  . CATARACT EXTRACTION W/ INTRAOCULAR LENS  IMPLANT, BILATERAL  2012  . FLEXIBLE SIGMOIDOSCOPY N/A 06/01/2013   Procedure: FLEXIBLE SIGMOIDOSCOPY;  Surgeon: Inda Castle, MD;  Location: [facility];  Service: Endoscopy;  Laterality: N/A;  may need hemorrhoidal banding  . HEMORRHOIDECTOMY WITH HEMORRHOID BANDING  08-07-2010  . LAPAROSCOPIC  CHOLECYSTECTOMY  1990  . RIGHT/LEFT HEART CATH AND CORONARY ANGIOGRAPHY N/A 09/29/2017   Procedure: RIGHT/LEFT HEART CATH AND CORONARY ANGIOGRAPHY;  Surgeon: Sherren Mocha, MD;  Location: [facility];  Service: Cardiovascular;  Laterality: N/A;  . SHOULDER ARTHROSCOPY WITH OPEN ROTATOR CUFF REPAIR AND DISTAL CLAVICLE ACROMINECTOMY Right 05/25/2012   Procedure: SHOULDER ARTHROSCOPY WITH OPEN ROTATOR CUFF REPAIR AND DISTAL CLAVICLE ACROMINECTOMY;  Surgeon: Magnus Sinning, MD;  Location: [place];  Service: Orthopedics;   Laterality: Right;  RIGHT SHOULDER ARTHROSCOPY WITH DERBRIDEMENTOF LABRAL/BICEP TENDON, OPEN DISTAL CLAVICLE RESECTION, ANTERIOR ACROMINECTOMY ROTATOR CUFF REPAIR ANESTHESIA: GENERAL/SCALENE NERVE BLOCK  . TENOSYNOVECTOMY Left 01/04/2014   Procedure: LEFT WRIST EXTENSOR TENOSYNOVECTOMY;  Surgeon: Linna Hoff, MD;  Location: [facility];  Service: Orthopedics;  Laterality: Left;  . THYROIDECTOMY  AGE 71   GOITER  . TONSILLECTOMY AND ADENOIDECTOMY  AGE 82  . TRANSTHORACIC ECHOCARDIOGRAM  last one 04-20-2013  DR [facility]    NORMAL LVSF/ EF 60-65%/ MODERATE  AV  STENOSIS WITH NO AR /  MILD LAE  . UMBILICAL HERNIA REPAIR  JUNE 2006  . VAGINAL HYSTERECTOMY  01-31-2001   ANTERIOR & POSTERIOR REPAIR/ TRANSVAGINAL BLADDER SLING    Family History  Problem Relation Age of Onset  . Cirrhosis Mother        drinking  . Other Brother        duodenal ulcer  . Heart disease Sister   . Cancer Son 47       colon  . Gallbladder disease Maternal Grandmother   . Stomach cancer Paternal Grandfather     Social History   Socioeconomic History  . Marital status: Married    Spouse name: Not on file  . Number of children: 3  . Years of education: Not on file  . Highest education level: Not on file  Occupational History  . Not on file  Social Needs  . Financial resource strain: Not on file  . Food insecurity:    Worry: Not on file    Inability: Not on file  . Transportation needs:    Medical: Not on file    Non-medical: Not on file  Tobacco Use  . Smoking status: Former Smoker    Packs/day: 0.25    Years: 5.00    Pack years: 1.25    Types: Cigarettes    Last attempt to quit: 04/14/2003    Years since quitting: 14.5  . Smokeless tobacco: Never Used  Substance and Sexual Activity  . Alcohol use: Yes    Comment: once in a while-social  . Drug use: Never    Comment: hemp oil   . Sexual activity: Not Currently  Lifestyle  . Physical activity:    Days per week: Not on file     Minutes per session: Not on file  . Stress: Not on file  Relationships  . Social connections:    Talks on phone: Not on file    Gets together: Not on file    Attends religious service: Not on file    Active member of club or organization: Not on file    Attends meetings of clubs or organizations: Not on file    Relationship status: Not on file  . Intimate partner violence:    Fear of current or ex partner: Not on file    Emotionally abused: Not on file    Physically abused: Not on file    Forced sexual activity: Not on file  Other Topics Concern  . Not on file  Social History Narrative  . Not on file    Current Outpatient Medications  Medication Sig Dispense Refill  . alendronate (FOSAMAX) 70 MG tablet Take 1 tablet (70 mg total) by mouth every 7 (seven) days. Take with a full glass of water on an empty stomach. 4 tablet 11  . Aloe Vera 25 MG CAPS Take 25 mg by mouth 2 (two) times daily.     Marland Kitchen aspirin 81 MG tablet Take 81 mg by mouth 2 (two) times daily.     Marland Kitchen BEE POLLEN PO Take 1 capsule by mouth daily.     . clobetasol cream (TEMOVATE) 7.61 % Apply 1 application topically 2 (two) times daily as needed. (Patient taking differently: Apply 1 application topically 2 (two) times daily as needed (for rash). ) 30 g 3  . metoprolol tartrate (LOPRESSOR) 50 MG tablet Take 1 tablet (50 mg total) by mouth 2 (two) times daily. 180 tablet 1  . OVER THE COUNTER MEDICATION Take 1 capsule by mouth 2 (two) times daily. Hemp Oil    . OVER THE COUNTER MEDICATION Take 1 capsule by mouth daily. Blood Builder Supplement     . Polyethylene Glycol 400 (BLINK TEARS) 0.25 % SOLN Place 1-2 drops into both eyes daily as needed (for dry eyes).    . rosuvastatin (CRESTOR) 5 MG tablet Take 1 tablet (5 mg total) by mouth daily. 90 tablet 1  . TURMERIC PO Take 1 capsule by mouth daily.     No current facility-administered medications for this visit.    Facility-Administered Medications Ordered in Other Visits    Medication Dose Route Frequency Provider Last Rate Last Dose  . iopamidol (ISOVUE-370) 76 % injection             Allergies  Allergen Reactions  . Tape Other (See Comments)    Dermatitis rash "with extended exposure"  . Prolia [Denosumab] Other (See Comments)    Arthralgia/ myalgia/ jaw pain/ headache      Review of Systems:   General:  normal appetite, +decreased energy, no weight gain, no weight loss, no fever  Cardiac:  no chest pain with exertion, no chest pain at rest, +SOB with mild exertion, no resting SOB, no PND, no orthopnea, no palpitations, no arrhythmia, no atrial fibrillation, chronic left LE edema, no dizzy spells, no syncope  Respiratory:  + exertional shortness of breath, no home oxygen, no productive cough, + dry cough, no bronchitis, no wheezing, no hemoptysis, no asthma, no pain with inspiration or cough, + sleep apnea, does not use CPAP at night because she feels like she can't breath.  GI:   no difficulty swallowing, no reflux, no frequent heartburn, no hiatal hernia, no abdominal pain, no constipation, no diarrhea, no hematochezia, no hematemesis, no melena  GU:   no dysuria,  no frequency, no urinary tract infection, no hematuria, no kidney stones, + kidney disease  Vascular:  no pain suggestive of claudication, + pain in feet, no leg cramps, no varicose veins, no DVT, no non-healing foot ulcer  Neuro:   no stroke, no TIA's, no seizures, no headaches, no temporary blindness one eye,  no slurred speech, + peripheral neuropathy, + chronic pain, no instability of gait, no memory/cognitive dysfunction  Musculoskeletal: + arthritis, no joint swelling, + myalgias, no difficulty walking, normal mobility   Skin:   no rash, no itching, no skin infections, no pressure sores or ulcerations  Psych:   no anxiety,  no depression, no nervousness, no unusual recent stress  Eyes:   no blurry vision, no floaters, no recent vision changes, does not wear glasses or contacts  ENT:   no  hearing loss, no loose or painful teeth, no dentures, last saw dentist recently and sees every 6 months.  Hematologic:  no easy bruising, no abnormal bleeding, no clotting disorder, no frequent epistaxis  Endocrine:  no diabetes, does not check CBG's at home       Physical Exam:   BP 108/62 (BP Location: Left Arm, Patient Position: Sitting, Cuff Size: Normal)   Pulse (!) 59   Resp 18   Ht 5\' 2"  (1.575 m)   Wt 157 lb 9.6 oz (71.5 kg)   SpO2 96% Comment: RA  BMI 28.83 kg/m   General:  Elderly but well-appearing, looks good for her age  30:  Unremarkable, NCAT, PERLA, EOMI, oropharynx clear  Neck:   no JVD, no bruits, no adenopathy or thyromegaly  Chest:   clear to auscultation, symmetrical breath sounds, no wheezes, no rhonchi   CV:   RRR, grade III/VI crescendo/decrescendo murmur heard best at RSB,  no diastolic murmur  Abdomen:  soft, non-tender, no masses or organomegaly  Extremities:  warm, well-perfused, pulses palpable in feet, no LE edema  Rectal/GU  Deferred  Neuro:   Grossly non-focal and symmetrical throughout  Skin:   Clean and dry, no rashes, no breakdown   Diagnostic Tests:     [facility]                        [address], [place] [address]                            [phone]  ------------------------------------------------------------------- Transthoracic Echocardiography  Patient:    Tasia, Liz MR #:       [id number] Study Date: 09/07/2017 Gender:     F Age:        78 Height:     157.5 cm Weight:     70.8 kg BSA:        1.78 m^2 Pt. Status: Room:   Jetty Duhamel, M.D.  REFERRING    Jenkins Rouge, M.D.  ATTENDING    Kirk Ruths  SONOGRAPHER  Wyatt Mage, RDCS  PERFORMING   [facility], Outpatient  cc:  ------------------------------------------------------------------- LV EF: 65% -   70%  ------------------------------------------------------------------- Indications:       Aortic stenosis (I35).  ------------------------------------------------------------------- History:   PMH:   Aortic valve disease.  Risk factors:  Anemia. Palpitation. Hypertension. Dyslipidemia.  ------------------------------------------------------------------- Study Conclusions  - Left ventricle: The cavity size was normal. There was mild focal   basal hypertrophy of the septum. Systolic function was vigorous.   The estimated ejection fraction was in the range of 65% to 70%.   Wall motion was normal; there were no regional wall motion   abnormalities. Doppler parameters are consistent with abnormal   left ventricular relaxation (grade 1 diastolic dysfunction).   Doppler parameters are consistent with high ventricular filling   pressure. - Aortic valve: Valve mobility was restricted. There was severe   stenosis. There was trivial regurgitation. Mean gradient (S): 40   mm Hg. Peak gradient (S): 74 mm Hg. - Mitral  valve: Calcified annulus. There was mild regurgitation. - Pulmonary arteries: Systolic pressure was mildly increased. PA   peak pressure: 35 mm Hg (S).  Impressions:  - Vigorous LV systolic function; mild diastolic dysfunction;   proximal septal thickening; calcified aortic valve with severe AS   and trace AI; mild MR; trace TR wlith mild pulmonary   hypertension.  ------------------------------------------------------------------- Labs, prior tests, procedures, and surgery: Transthoracic echocardiography (02/16/2017).    The aortic valve showed moderate to severe stenosis.  EF was 60%. Aortic valve: peak gradient of 58 mm Hg and mean gradient of 32 mm Hg.  ------------------------------------------------------------------- Study data:  No prior study was available for comparison.  Study status:  Routine.  Procedure:  The patient reported no pain pre or post test. Transthoracic echocardiography. Image quality was adequate.  Study completion:  There were no  complications. Transthoracic echocardiography.  M-mode, complete 2D, 3D, spectral Doppler, and color Doppler.  Birthdate:  Patient birthdate: 1935/12/03.  Age:  Patient is 82 yr old.  Sex:  Gender: female. BMI: 28.5 kg/m^2.  Blood pressure:     143/65  Patient status: Outpatient.  Study date:  Study date: 09/07/2017. Study time: 12:24 PM.  Location:  [facility]  -------------------------------------------------------------------  ------------------------------------------------------------------- Left ventricle:  The cavity size was normal. There was mild focal basal hypertrophy of the septum. Systolic function was vigorous. The estimated ejection fraction was in the range of 65% to 70%. Wall motion was normal; there were no regional wall motion abnormalities. Doppler parameters are consistent with abnormal left ventricular relaxation (grade 1 diastolic dysfunction). Doppler parameters are consistent with high ventricular filling pressure.   ------------------------------------------------------------------- Aortic valve:   Severely calcified leaflets. Valve mobility was restricted.  Doppler:   There was severe stenosis.   There was trivial regurgitation.    VTI ratio of LVOT to aortic valve: 0.24. Valve area (VTI): 0.76 cm^2. Indexed valve area (VTI): 0.43 cm^2/m^2. Peak velocity ratio of LVOT to aortic valve: 0.26. Valve area (Vmax): 0.8 cm^2. Indexed valve area (Vmax): 0.45 cm^2/m^2. Mean velocity ratio of LVOT to aortic valve: 0.24. Valve area (Vmean): 0.74 cm^2. Indexed valve area (Vmean): 0.42 cm^2/m^2. Mean gradient (S): 40 mm Hg. Peak gradient (S): 74 mm Hg.  ------------------------------------------------------------------- Aorta:  Aortic root: The aortic root was normal in size.  ------------------------------------------------------------------- Mitral valve:   Calcified annulus. Mobility was not restricted. Doppler:  Transvalvular velocity was within the  normal range. There was no evidence for stenosis. There was mild regurgitation. Valve area by pressure half-time: 1.82 cm^2. Indexed valve area by pressure half-time: 1.02 cm^2/m^2.    Peak gradient (D): 4 mm Hg.   ------------------------------------------------------------------- Left atrium:  The atrium was normal in size.  ------------------------------------------------------------------- Right ventricle:  The cavity size was normal. Systolic function was normal.  ------------------------------------------------------------------- Pulmonic valve:    Doppler:  Transvalvular velocity was within the normal range. There was no evidence for stenosis.  ------------------------------------------------------------------- Tricuspid valve:   Structurally normal valve.    Doppler: Transvalvular velocity was within the normal range. There was trivial regurgitation.  ------------------------------------------------------------------- Pulmonary artery:   Systolic pressure was mildly increased.  ------------------------------------------------------------------- Right atrium:  The atrium was normal in size.  ------------------------------------------------------------------- Pericardium:  There was no pericardial effusion.  ------------------------------------------------------------------- Systemic veins: Inferior vena cava: The vessel was normal in size.  ------------------------------------------------------------------- Measurements   Left ventricle  Value          Reference  LV ID, ED, PLAX chordal           (L)     33    mm       43 - 52  LV ID, ES, PLAX chordal                   23    mm       23 - 38  LV fx shortening, PLAX chordal            30    %        >=29  LV PW thickness, ED                       7.94  mm       ---------  IVS/LV PW ratio, ED               (H)     1.71           <=1.3  Stroke volume, 2D                         78     ml       ---------  Stroke volume/bsa, 2D                     44    ml/m^2   ---------  LV e&', lateral                            5.27  cm/s     ---------  LV E/e&', lateral                          17.89          ---------  LV e&', medial                             5.22  cm/s     ---------  LV E/e&', medial                           18.07          ---------  LV e&', average                            5.25  cm/s     ---------  LV E/e&', average                          17.98          ---------    Ventricular septum                        Value          Reference  IVS thickness, ED                         13.59 mm       ---------    LVOT  Value          Reference  LVOT ID, S                                20    mm       ---------  LVOT area                                 3.14  cm^2     ---------  LVOT peak velocity, S                     110   cm/s     ---------  LVOT mean velocity, S                     68.6  cm/s     ---------  LVOT VTI, S                               24.8  cm       ---------    Aortic valve                              Value          Reference  Aortic valve peak velocity, S             431   cm/s     ---------  Aortic valve mean velocity, S             290   cm/s     ---------  Aortic valve VTI, S                       103   cm       ---------  Aortic mean gradient, S                   40    mm Hg    ---------  Aortic peak gradient, S                   74    mm Hg    ---------  VTI ratio, LVOT/AV                        0.24           ---------  Aortic valve area, VTI                    0.76  cm^2     ---------  Aortic valve area/bsa, VTI                0.43  cm^2/m^2 ---------  Velocity ratio, peak, LVOT/AV             0.26           ---------  Aortic valve area, peak velocity          0.8   cm^2     ---------  Aortic valve area/bsa, peak               0.45  cm^2/m^2 ---------  velocity  Velocity ratio, mean, LVOT/AV  0.24           ---------  Aortic valve area, mean velocity          0.74  cm^2     ---------  Aortic valve area/bsa, mean               0.42  cm^2/m^2 ---------  velocity    Aorta                                     Value          Reference  Aortic root ID, ED                        30    mm       ---------  Ascending aorta ID, A-P, S                28    mm       ---------    Left atrium                               Value          Reference  LA ID, A-P, ES                            40    mm       ---------  LA ID/bsa, A-P                    (H)     2.25  cm/m^2   <=2.2  LA volume, S                              42.3  ml       ---------  LA volume/bsa, S                          23.7  ml/m^2   ---------  LA volume, ES, 1-p A4C                    34.6  ml       ---------  LA volume/bsa, ES, 1-p A4C                19.4  ml/m^2   ---------  LA volume, ES, 1-p A2C                    46.7  ml       ---------  LA volume/bsa, ES, 1-p A2C                26.2  ml/m^2   ---------    Mitral valve                              Value          Reference  Mitral E-wave peak velocity               94.3  cm/s     ---------  Mitral A-wave peak velocity  143   cm/s     ---------  Mitral deceleration time          (H)     412   ms       150 - 230  Mitral pressure half-time                 121   ms       ---------  Mitral peak gradient, D                   4     mm Hg    ---------  Mitral E/A ratio, peak                    0.7            ---------  Mitral valve area, PHT, DP                1.82  cm^2     ---------  Mitral valve area/bsa, PHT, DP            1.02  cm^2/m^2 ---------    Pulmonary arteries                        Value          Reference  PA pressure, S, DP                (H)     35    mm Hg    <=30    Tricuspid valve                           Value          Reference  Tricuspid regurg peak velocity            258   cm/s     ---------  Tricuspid peak RV-RA gradient              27    mm Hg    ---------    Systemic veins                            Value          Reference  Estimated CVP                             8     mm Hg    ---------    Right ventricle                           Value          Reference  TAPSE                                     18.3  mm       ---------  RV pressure, S, DP                (H)     35    mm Hg    <=30  RV s&', lateral, S  11.9  cm/s     ---------  Legend: (L)  and  (H)  mark values outside specified reference range.  ------------------------------------------------------------------- Prepared and Electronically Authenticated by  Kirk Ruths 2019-05-28T13:13:01   Physicians   Panel Physicians Referring Physician Case Authorizing Physician  Sherren Mocha, MD (Primary)    Procedures   RIGHT/LEFT HEART CATH AND CORONARY ANGIOGRAPHY  Conclusion   1.  Known severe aortic stenosis by noninvasive assessment with heavily calcified and restricted aortic valve leaflets biplane fluoroscopy 2.  Heavy mitral annular calcium 3.  Widely patent coronary arteries with minor luminal irregularities 4.  Mildly elevated right heart pressures   Recommendation: Continued multidisciplinary heart team evaluation for treatment of severe aortic stenosis  Indications   Severe aortic stenosis [I35.0 (ICD-10-CM)]  Procedural Details/Technique   Technical Details INDICATION: Severe aortic stenosis  PROCEDURAL DETAILS: There was an indwelling IV in a right antecubital vein. Attempts were made to change out the IV but a wire would not pass into the vein in order to change the IV out for sheath. Multiple attempts were made. Attention was then turned to the right groin and ultrasound guidance is used. Ultrasound images are captured and stored in the patient's paper chart. The femoral vein is accessed via a front wall puncture under direct ultrasound guidance and a 7 French sheath is placed. The right wrist was then  prepped, draped, and anesthetized with 1% lidocaine. Using direct ultrasound guidance a 5/6 French Slender sheath was placed in the right radial artery. Intra-arterial verapamil was administered through the radial artery sheath. IV heparin was administered after a JR4 catheter was advanced into the central aorta. A Swan-Ganz catheter was used for the right heart catheterization. Standard protocol was followed for recording of right heart pressures and sampling of oxygen saturations. Fick cardiac output was calculated. Standard Judkins catheters were used for selective coronary angiography. The patient has significant subclavian tortuosity and catheter advancement into the central aorta is difficult. Attempts are not made to cross the aortic valve as the patient has documented severe aortic stenosis by echo assessment. There were no immediate procedural complications. The patient was transferred to the post catheterization recovery area for further monitoring.     Estimated blood loss <50 mL.  During this procedure the patient was administered the following to achieve and maintain moderate conscious sedation: Versed 2 mg, Fentanyl 50 mcg, while the patient's heart rate, blood pressure, and oxygen saturation were continuously monitored. The period of conscious sedation was 68 minutes, of which I was present face-to-face 100% of this time.  Coronary Findings   Diagnostic  Dominance: Right  Left Anterior Descending  The vessel exhibits minimal luminal irregularities. The LAD is a normal caliber vessel. It wraps around the LV apex. There are mild irregularities in the midportion of the vessel with no significant stenosis.  Left Circumflex  Vessel is large. The circumflex is large in caliber. The vessel supplies a large obtuse marginal branch. There is no stenosis throughout the circumflex distribution.  Right Coronary Artery  Vessel is moderate in size. The vessel exhibits minimal luminal irregularities.  Widely patent vessel, medium in caliber, with some calcification at the ostium but no significant stenosis throughout  Intervention   No interventions have been documented.  Left Heart   Mitral Valve The annulus is calcified. The mitral annulus is heavily calcified.  Aortic Valve There is severe aortic valve stenosis. The aortic valve is calcified. There is restricted aortic valve motion. The aortic valve was not crossed.  The aortic valve is severely calcified with restricted leaflet mobility visualized on plain fluoroscopy.  Coronary Diagrams   Diagnostic Diagram       Implants    No implant documentation for this case.  MERGE Images   Show images for CARDIAC CATHETERIZATION   Link to Procedure Log   Procedure Log    Hemo Data    Most Recent Value  RA A Wave 15 mmHg  RA V Wave 12 mmHg  RA Mean 11 mmHg  RV Systolic Pressure 42 mmHg  RV Diastolic Pressure 11 mmHg  RV EDP 17 mmHg  PA Systolic Pressure 37 mmHg  PA Diastolic Pressure 17 mmHg  PA Mean 27 mmHg  PW A Wave 22 mmHg  PW V Wave 22 mmHg  PW Mean 20 mmHg  AO Systolic Pressure 119 mmHg  AO Diastolic Pressure 64 mmHg  AO Mean 96 mmHg   STS Adult Cardiac Surgery Database Version 2.9   Procedure: Isolated AVR   Risk of Mortality: 2.070%  Renal Failure: 0.914%  Permanent Stroke: 1.473%  Prolonged Ventilation: 5.848%  DSW Infection: 0.067%  Reoperation: 2.933%  Morbidity or Mortality: 9.535%  Short Length of Stay: 39.324%  Long Length of Stay: 4.097%    ADDENDUM REPORT: 10/11/2017 12:56  CLINICAL DATA:  Aortic stenosis  EXAM: Cardiac TAVR CT  TECHNIQUE: The patient was scanned on a [employer] 192 scanner. A 120 kV retrospective scan was triggered in the descending thoracic aorta at 111 HU's. Gantry rotation speed was 270 msecs and collimation was .9 mm. No beta blockade or nitro were given. The 3D data set was reconstructed in 5% intervals of the R-R cycle. Systolic and diastolic phases were  analyzed on a dedicated work station using MPR, MIP and VRT modes. The patient received 80 cc of contrast.  FINDINGS: Aortic Valve: Calcified and tri leaflet with small annulus  Aorta: Moderate calcific aortic atherosclerosis with normal arch vessel take offs.  Sinotubular Junction: 24 mm  Ascending Thoracic Aorta: 31 mm  Aortic Arch: 25 mm  Descending Thoracic Aorta: 21 mm  Sinus of Valsalva Measurements:  Non-coronary: 27.8 mm  Right - coronary: 24.9 mm  Left - coronary: 25.2 mm  Coronary Artery Height above Annulus:  Left Main: 12.2 mm above annulus  Right Coronary: 15 mm above annulus  Virtual Basal Annulus Measurements:  Maximum/Minimum Diameter: 23.5 mm x 17.1 mm  Perimeter: 65.2 mm  Area: 317 mm2  Coronary Arteries: Coronary arteries sufficient height above annulus for deployment  Optimum Fluoroscopic Angle for Delivery: LAO 9 Caudal 17 degrees  IMPRESSION: 1. Calcified tri-leaflet AV with small annulus only suitable for a 20 mm Sapien 3 valve  2. Optimum angiographic angle for deployment LAO 9 Caudal 17 degrees  3.  Coronary arteries sufficient height above annulus for deployment  4.  No LAA thrombus  Jenkins Rouge   Electronically Signed   By: Jenkins Rouge M.D.   On: 10/11/2017 12:56   Addended by [name], MD on 10/11/2017 12:59 PM    Study Result   EXAM: OVER-READ INTERPRETATION  CT CHEST  The following report is an over-read performed by radiologist Dr. Vinnie Langton of [facility], [place] on 10/11/2017. This over-read does not include interpretation of cardiac or coronary anatomy or pathology. The coronary calcium score/coronary CTA interpretation by the cardiologist is attached.  COMPARISON:  None.  FINDINGS: Extracardiac findings will be described separately under dictation for contemporaneously obtained CTA chest, abdomen and pelvis.  IMPRESSION: Please see separate  dictation for  contemporaneously obtained CTA chest, abdomen and pelvis dated 10/11/2017 for full description of relevant extracardiac findings.  Electronically Signed: By: Vinnie Langton M.D. On: 10/11/2017 11:05       CLINICAL DATA:  82 year old female with history of severe aortic stenosis. Preprocedural study prior to potential transcatheter aortic valve replacement (TAVR) procedure.  EXAM: CT ANGIOGRAPHY CHEST, ABDOMEN AND PELVIS  TECHNIQUE: Multidetector CT imaging through the chest, abdomen and pelvis was performed using the standard protocol during bolus administration of intravenous contrast. Multiplanar reconstructed images and MIPs were obtained and reviewed to evaluate the vascular anatomy.  CONTRAST:  65mL ISOVUE-370 IOPAMIDOL (ISOVUE-370) INJECTION 76%  COMPARISON:  No prior chest CT. CT the abdomen and pelvis 06/11/2015.  FINDINGS: CTA CHEST FINDINGS  Cardiovascular: Heart size is normal. There is no significant pericardial fluid, thickening or pericardial calcification. Aortic atherosclerosis. No definite coronary artery calcifications. Severe thickening calcification of the aortic valve. Severe calcifications of the mitral annulus.  Mediastinum/Lymph Nodes: No pathologically enlarged mediastinal or hilar lymph nodes. Esophagus is unremarkable in appearance. No axillary lymphadenopathy.  Lungs/Pleura: 4 mm right lower lobe nodule (axial image 74 of series 15). No other larger more suspicious appearing pulmonary nodules or masses are noted. No acute consolidative airspace disease. No pleural effusions.  Musculoskeletal/Soft Tissues: Orthopedic fixation hardware in the lower cervical spine incompletely imaged. There are no aggressive appearing lytic or blastic lesions noted in the visualized portions of the skeleton.  CTA ABDOMEN AND PELVIS FINDINGS  Hepatobiliary: No suspicious cystic or solid hepatic lesions. No intra or extrahepatic biliary  ductal dilatation. Status post cholecystectomy.  Pancreas: No pancreatic mass. No pancreatic ductal dilatation. No pancreatic or peripancreatic fluid or inflammatory changes.  Spleen: Unremarkable.  Adrenals/Urinary Tract: Numerous subcentimeter low-attenuation lesions in both kidneys, too small to characterize, but statistically likely to represent cysts. 1.3 cm intermediate attenuation (42 HU) lesion at the junction of upper and interpolar region of the left kidney, not characterized but favored to represent a proteinaceous/hemorrhagic cyst. No hydroureteronephrosis. Urinary bladder is normal in appearance. Bilateral adrenal glands are normal in appearance.  Stomach/Bowel: Normal appearance of the stomach. No pathologic dilatation of small bowel or colon. Numerous colonic diverticulae are noted, particularly in the descending colon and sigmoid colon, without surrounding inflammatory changes to suggest an acute diverticulitis at this time. The appendix is not confidently identified and may be surgically absent. Regardless, there are no inflammatory changes noted adjacent to the cecum to suggest the presence of an acute appendicitis at this time.  Vascular/Lymphatic: Aortic atherosclerosis, without evidence of aneurysm or dissection in the abdominal or pelvic vasculature. Vascular findings and measurements pertinent to potential TAVR procedure, as detailed below. Celiac axis, superior mesenteric artery and inferior mesenteric artery are all widely patent without hemodynamically significant stenosis. Single renal arteries bilaterally are patent without hemodynamically significant stenosis. No lymphadenopathy noted in the abdomen or pelvis.  Reproductive: Status post hysterectomy.  Ovaries are atrophic.  Other: No significant volume of ascites.  No pneumoperitoneum.  Musculoskeletal: There are no aggressive appearing lytic or blastic lesions noted in the visualized  portions of the skeleton.  VASCULAR MEASUREMENTS PERTINENT TO TAVR:  AORTA:  Minimal Aortic Diameter-9 x 9 mm  Severity of Aortic Calcification-severe  RIGHT PELVIS:  Right Common Iliac Artery -  Minimal Diameter-8.7 x 5.5 mm  Tortuosity-moderate  Calcification-mild  Right External Iliac Artery -  Minimal Diameter-5.6 x 5.5 mm  Tortuosity-mild  Calcification-none  Right Common Femoral Artery -  Minimal Diameter-6.2 x 6.9 mm  Tortuosity-mild  Calcification-mild  LEFT PELVIS:  Left Common Iliac Artery -  Minimal Diameter-7.0 x 5.4 mm  Tortuosity-mild  Calcification-mild-to-moderate  Left External Iliac Artery -  Minimal Diameter-6.2 x 5.7 mm  Tortuosity-mild  Calcification-none  Left Common Femoral Artery -  Minimal Diameter-6.1 x 6.8 mm  Tortuosity-mild  Calcification-mild  Review of the MIP images confirms the above findings.  IMPRESSION: 1. Aortic atherosclerosis with vascular findings and measurements pertinent to potential TAVR procedure, as detailed above. 2. Severe thickening calcification of the aortic valve, compatible with the reported clinical history of severe aortic stenosis. 3.  There are also severe calcifications of the mitral annulus. 4. 4 mm right lower lobe pulmonary nodule. This is nonspecific, but statistically likely benign. No follow-up needed if patient is low-risk. Non-contrast chest CT can be considered in 12 months if patient is high-risk. This recommendation follows the consensus statement: Guidelines for Management of Incidental Pulmonary Nodules Detected on CT Images: From the Fleischner Society 2017; Radiology 2017; 284:228-243. 5. Severe colonic diverticulosis without evidence of acute diverticulitis at this time. 6. Additional incidental findings, as above.  Aortic Atherosclerosis (ICD10-I70.0).   Electronically Signed   By: Vinnie Langton M.D.   On: 10/11/2017  12:42  Impression:  This 82 year old woman has stage D, severe, symptomatic aortic stenosis with New York Heart Association class II symptoms of exertional fatigue and shortness of breath that are limiting her activity level.  She is also been having chest pressure with exertion.  I agree that aortic valve replacement is indicated in this patient to improve her quality of life and prevent left ventricular deterioration.  I have personally reviewed her 2D echocardiogram, cardiac catheterization, and CTA studies.  Her echocardiogram shows a trileaflet aortic valve with severe calcification of the leaflets and restricted mobility.  The mean gradient is 40 mmHg consistent with severe aortic stenosis.  Left ventricular systolic function is normal.  Her cardiac catheterization shows no significant coronary disease.  Her operative risk for open surgical aortic valve replacement would be moderately elevated given her advanced age and her relatively small annulus which would likely result in the need for annular enlargement or aortic root replacement.  She says that she is not interested in open surgical aortic valve replacement under any circumstances.  I think transcatheter aortic valve replacement would be a reasonable alternative for her.  Her gated cardiac CTA shows anatomy suitable for transcatheter aortic valve replacement although she has a relatively small annulus that sizes to a 20 mm Sapien 3 valve.  Her BSA is 1.78.  Her abdominal and pelvic CTA shows pelvic arterial anatomy suitable for transfemoral insertion of a Sapien 3 valve.  We will need to discuss her case with the multidisciplinary heart valve team to decide which type of transcatheter aortic valve to use.  Her iliofemoral vessels are not large enough for insertion of a Medtronic Evolut valve through a separate sheath.  The patient was counseled at length regarding treatment alternatives for management of severe symptomatic aortic stenosis. The  risks and benefits of surgical intervention has been discussed in detail. Long-term prognosis with medical therapy was discussed. Alternative approaches such as conventional surgical aortic valve replacement, transcatheter aortic valve replacement, and palliative medical therapy were compared and contrasted at length. This discussion was placed in the context of the patient's own specific clinical presentation and past medical history. All of her questions have been addressed.   Following the decision to proceed with transcatheter aortic valve replacement, a discussion was held  regarding what types of management strategies would be attempted intraoperatively in the event of life-threatening complications, including whether or not the patient would be considered a candidate for the use of cardiopulmonary bypass and/or conversion to open sternotomy for attempted surgical intervention. The patient has been advised of a variety of complications that might develop including but not limited to risks of death, stroke, paravalvular leak, aortic dissection or other major vascular complications, aortic annulus rupture, device embolization, cardiac rupture or perforation, mitral regurgitation, acute myocardial infarction, arrhythmia, heart block or bradycardia requiring permanent pacemaker placement, congestive heart failure, respiratory failure, renal failure, pneumonia, infection, other late complications related to structural valve deterioration or migration, or other complications that might ultimately cause a temporary or permanent loss of functional independence or other long term morbidity. The patient provides full informed consent for the procedure as described and all questions were answered.    Plan:  Her case will be discussed with the multidisciplinary heart valve team and we will tentatively plan to proceed with transfemoral  transcatheter aortic valve replacement on Tuesday, 10/19/2017.  I spent 60  minutes performing this consultation and > 50% of this time was spent face to face counseling and coordinating the care of this patient's severe symptomatic aortic stenosis.    Gaye Pollack, MD 10/11/2017 5:32 PM

## 2017-10-12 ENCOUNTER — Other Ambulatory Visit: Payer: Self-pay

## 2017-10-12 ENCOUNTER — Encounter: Payer: Medicare Other | Admitting: Thoracic Surgery (Cardiothoracic Vascular Surgery)

## 2017-10-12 DIAGNOSIS — I350 Nonrheumatic aortic (valve) stenosis: Secondary | ICD-10-CM

## 2017-10-12 NOTE — Progress Notes (Signed)
I called patient who stated she is having Aortic Valve Replacement surgery on 10/19/17.  Patient will call to schedule the Monovisc injection some time in late August or early September.

## 2017-10-13 NOTE — Pre-Procedure Instructions (Signed)
OLYVIA GOPAL  10/13/2017      [id number] - [place], [place] - [place] [address] Phone: [phone] Fax: [phone]    Your procedure is scheduled on October 19, 2017.  Report to [facility] Admitting at 530 AM.  Call this number if you have problems the morning of surgery:  [phone]   Remember:  Do not eat or drink after midnight.   Continue all medications as directed by your physician except follow these medication instructions before surgery below    Take these medicines the morning of surgery with A SIP OF WATER (none)    Do not wear jewelry, make-up or nail polish.  Do not wear lotions, powders, or perfumes, or deodorant.  Do not shave 48 hours prior to surgery.    Do not bring valuables to the hospital.  [facility] is not responsible for any belongings or valuables.  Contacts, dentures or bridgework may not be worn into surgery.  Leave your suitcase in the car.  After surgery it may be brought to your room.  For patients admitted to the hospital, discharge time will be determined by your treatment team.  Patients discharged the day of surgery will not be allowed to drive home.    - Preparing For Surgery  Before surgery, you can play an important role. Because skin is not sterile, your skin needs to be as free of germs as possible. You can reduce the number of germs on your skin by washing with CHG (chlorahexidine gluconate) Soap before surgery.  CHG is an antiseptic cleaner which kills germs and bonds with the skin to continue killing germs even after washing.    Oral Hygiene is also important to reduce your risk of infection.  Remember - BRUSH YOUR TEETH THE MORNING OF SURGERY WITH YOUR REGULAR TOOTHPASTE  Please do not use if you have an allergy to CHG or antibacterial soaps. If your skin becomes reddened/irritated stop using the CHG.  Do not shave (including legs and underarms) for at  least 48 hours prior to first CHG shower. It is OK to shave your face.  Please follow these instructions carefully.   1. Shower the NIGHT BEFORE SURGERY and the MORNING OF SURGERY with CHG.   2. If you chose to wash your hair, wash your hair first as usual with your normal shampoo.  3. After you shampoo, rinse your hair and body thoroughly to remove the shampoo.  4. Use CHG as you would any other liquid soap. You can apply CHG directly to the skin and wash gently with a scrungie or a clean washcloth.   5. Apply the CHG Soap to your body ONLY FROM THE NECK DOWN.  Do not use on open wounds or open sores. Avoid contact with your eyes, ears, mouth and genitals (private parts). Wash Face and genitals (private parts)  with your normal soap.  6. Wash thoroughly, paying special attention to the area where your surgery will be performed.  7. Thoroughly rinse your body with warm water from the neck down.  8. DO NOT shower/wash with your normal soap after using and rinsing off the CHG Soap.  9. Pat yourself dry with a CLEAN TOWEL.  10. Wear CLEAN PAJAMAS to bed the night before surgery, wear comfortable clothes the morning of surgery  11. Place CLEAN SHEETS on your bed the night of your first shower and DO NOT SLEEP WITH PETS.  Day of Surgery:  Do not apply any deodorants/lotions.  Please wear clean clothes to the hospital/surgery center.   Remember to brush your teeth WITH YOUR REGULAR TOOTHPASTE.   Please read over the following fact sheets that you were given. Pain Booklet, Coughing and Deep Breathing, MRSA Information and Surgical Site Infection Prevention

## 2017-10-15 ENCOUNTER — Encounter (HOSPITAL_COMMUNITY)
Admission: RE | Admit: 2017-10-15 | Discharge: 2017-10-15 | Disposition: A | Discharge status: Home / Self Care | Payer: Medicare Other | Source: Ambulatory Visit | Attending: Cardiovascular Disease | Admitting: Cardiovascular Disease

## 2017-10-15 ENCOUNTER — Ambulatory Visit (HOSPITAL_COMMUNITY)
Admission: RE | Admit: 2017-10-15 | Discharge: 2017-10-15 | Disposition: A | Discharge status: Home / Self Care | Payer: Medicare Other | Source: Ambulatory Visit | Attending: Cardiovascular Disease | Admitting: Cardiovascular Disease

## 2017-10-15 ENCOUNTER — Other Ambulatory Visit: Payer: Self-pay

## 2017-10-15 ENCOUNTER — Encounter (HOSPITAL_COMMUNITY): Payer: Self-pay

## 2017-10-15 DIAGNOSIS — Z01818 Encounter for other preprocedural examination: Secondary | ICD-10-CM | POA: Diagnosis not present

## 2017-10-15 DIAGNOSIS — R001 Bradycardia, unspecified: Secondary | ICD-10-CM | POA: Diagnosis not present

## 2017-10-15 DIAGNOSIS — Z0183 Encounter for blood typing: Secondary | ICD-10-CM | POA: Insufficient documentation

## 2017-10-15 DIAGNOSIS — I350 Nonrheumatic aortic (valve) stenosis: Secondary | ICD-10-CM | POA: Diagnosis not present

## 2017-10-15 DIAGNOSIS — Z01812 Encounter for preprocedural laboratory examination: Secondary | ICD-10-CM | POA: Diagnosis not present

## 2017-10-15 LAB — CBC
HEMATOCRIT: 42.1 % (ref 36.0–46.0)
Hemoglobin: 13.4 g/dL (ref 12.0–15.0)
MCH: 29.9 pg (ref 26.0–34.0)
MCHC: 31.8 g/dL (ref 30.0–36.0)
MCV: 94 fL (ref 78.0–100.0)
Platelets: 189 10*3/uL (ref 150–400)
RBC: 4.48 MIL/uL (ref 3.87–5.11)
RDW: 12.4 % (ref 11.5–15.5)
WBC: 6.6 10*3/uL (ref 4.0–10.5)

## 2017-10-15 LAB — COMPREHENSIVE METABOLIC PANEL
ALT: 18 U/L (ref 0–44)
AST: 20 U/L (ref 15–41)
Albumin: 3.7 g/dL (ref 3.5–5.0)
Alkaline Phosphatase: 56 U/L (ref 38–126)
Anion gap: 10 (ref 5–15)
BILIRUBIN TOTAL: 0.7 mg/dL (ref 0.3–1.2)
BUN: 9 mg/dL (ref 8–23)
CHLORIDE: 107 mmol/L (ref 98–111)
CO2: 21 mmol/L — ABNORMAL LOW (ref 22–32)
CREATININE: 0.71 mg/dL (ref 0.44–1.00)
Calcium: 9.6 mg/dL (ref 8.9–10.3)
Glucose, Bld: 92 mg/dL (ref 70–99)
POTASSIUM: 4.5 mmol/L (ref 3.5–5.1)
Sodium: 138 mmol/L (ref 135–145)
TOTAL PROTEIN: 6.7 g/dL (ref 6.5–8.1)

## 2017-10-15 LAB — PROTIME-INR
INR: 0.96
Prothrombin Time: 12.7 seconds (ref 11.4–15.2)

## 2017-10-15 LAB — HEMOGLOBIN A1C
HEMOGLOBIN A1C: 5.7 % — AB (ref 4.8–5.6)
MEAN PLASMA GLUCOSE: 116.89 mg/dL

## 2017-10-15 LAB — URINALYSIS, ROUTINE W REFLEX MICROSCOPIC
Bilirubin Urine: NEGATIVE
Glucose, UA: NEGATIVE mg/dL
Hgb urine dipstick: NEGATIVE
Ketones, ur: NEGATIVE mg/dL
LEUKOCYTES UA: NEGATIVE
Nitrite: NEGATIVE
PROTEIN: NEGATIVE mg/dL
SPECIFIC GRAVITY, URINE: 1.01 (ref 1.005–1.030)
pH: 5 (ref 5.0–8.0)

## 2017-10-15 LAB — TYPE AND SCREEN
ABO/RH(D): B POS
Antibody Screen: NEGATIVE

## 2017-10-15 LAB — ABO/RH: ABO/RH(D): B POS

## 2017-10-15 LAB — SURGICAL PCR SCREEN
MRSA, PCR: NEGATIVE
STAPHYLOCOCCUS AUREUS: NEGATIVE

## 2017-10-15 LAB — APTT: aPTT: 30 seconds (ref 24–36)

## 2017-10-15 LAB — BRAIN NATRIURETIC PEPTIDE: B Natriuretic Peptide: 107.7 pg/mL — ABNORMAL HIGH (ref 0.0–100.0)

## 2017-10-15 NOTE — Progress Notes (Signed)
PCP - Dr. Adam Phenix  Cardiologist - Dr. Jenkins Rouge  Chest x-ray - 10/15/17  EKG - 10/15/17  Stress Test - Denies  ECHO - 09/07/17 (E)  Cardiac Cath - 09/29/17 (E)  Sleep Study - Yes- Positive CPAP - None  LABS- 10/15/17: CBC, CMP, BNP, PT, PTT, T/S, UA  ASA- LD- 7/8  HA1C- 10/19/17   Anesthesia- Yes- Cardiac history  Pt denies having chest pain, sob, or fever at this time. All instructions explained to the pt, with a verbal understanding of the material. Pt agrees to go over the instructions while at home for a better understanding. The opportunity to ask questions was provided.

## 2017-10-18 ENCOUNTER — Other Ambulatory Visit: Payer: Self-pay

## 2017-10-18 DIAGNOSIS — I350 Nonrheumatic aortic (valve) stenosis: Secondary | ICD-10-CM

## 2017-10-18 LAB — BLOOD GAS, ARTERIAL

## 2017-10-18 MED ORDER — NITROGLYCERIN IN D5W 200-5 MCG/ML-% IV SOLN
2.0000 ug/min | INTRAVENOUS | Status: DC
  Filled 2017-10-18: qty 250

## 2017-10-18 MED ORDER — SODIUM CHLORIDE 0.9 % IV SOLN
30.0000 ug/min | INTRAVENOUS | Status: DC
  Filled 2017-10-18: qty 2

## 2017-10-18 MED ORDER — DEXMEDETOMIDINE HCL IN NACL 400 MCG/100ML IV SOLN
0.1000 ug/kg/h | INTRAVENOUS | Status: AC
  Administered 2017-10-19: 1 ug/kg/h via INTRAVENOUS
  Filled 2017-10-18: qty 100

## 2017-10-18 MED ORDER — DEXTROSE 5 % IV SOLN
0.0000 ug/min | INTRAVENOUS | Status: DC
  Filled 2017-10-18: qty 4

## 2017-10-18 MED ORDER — VANCOMYCIN HCL 10 G IV SOLR
1250.0000 mg | INTRAVENOUS | Status: AC
  Administered 2017-10-19: 1250 mg via INTRAVENOUS
  Filled 2017-10-18: qty 1250

## 2017-10-18 MED ORDER — NOREPINEPHRINE 4 MG/250ML-% IV SOLN
0.0000 ug/min | INTRAVENOUS | Status: AC
  Administered 2017-10-19: 4 ug/min via INTRAVENOUS
  Filled 2017-10-18: qty 250

## 2017-10-18 MED ORDER — SODIUM CHLORIDE 0.9 % IV SOLN
1.5000 g | INTRAVENOUS | Status: AC
  Administered 2017-10-19: 1.5 g via INTRAVENOUS
  Filled 2017-10-18: qty 1.5

## 2017-10-18 MED ORDER — SODIUM CHLORIDE 0.9 % IV SOLN
INTRAVENOUS | Status: DC
  Filled 2017-10-18: qty 1

## 2017-10-18 MED ORDER — SODIUM CHLORIDE 0.9 % IV SOLN
INTRAVENOUS | Status: DC
  Filled 2017-10-18: qty 30

## 2017-10-18 MED ORDER — POTASSIUM CHLORIDE 2 MEQ/ML IV SOLN
80.0000 meq | INTRAVENOUS | Status: DC
  Filled 2017-10-18: qty 40

## 2017-10-18 MED ORDER — DOPAMINE-DEXTROSE 3.2-5 MG/ML-% IV SOLN
0.0000 ug/kg/min | INTRAVENOUS | Status: DC
  Filled 2017-10-18: qty 250

## 2017-10-18 MED ORDER — MAGNESIUM SULFATE 50 % IJ SOLN
40.0000 meq | INTRAMUSCULAR | Status: DC
  Filled 2017-10-18: qty 9.85

## 2017-10-19 ENCOUNTER — Inpatient Hospital Stay (HOSPITAL_COMMUNITY)
Admission: RE | Admit: 2017-10-19 | Discharge: 2017-10-21 | DRG: 267 | Disposition: A | Discharge status: Home / Self Care | Payer: Medicare Other | Source: Ambulatory Visit | Attending: Thoracic Surgery (Cardiothoracic Vascular Surgery) | Admitting: Thoracic Surgery (Cardiothoracic Vascular Surgery)

## 2017-10-19 ENCOUNTER — Ambulatory Visit (HOSPITAL_COMMUNITY): Payer: Medicare Other | Attending: Cardiovascular Disease

## 2017-10-19 ENCOUNTER — Inpatient Hospital Stay (HOSPITAL_COMMUNITY): Payer: Medicare Other

## 2017-10-19 ENCOUNTER — Encounter (HOSPITAL_COMMUNITY): Payer: Self-pay | Admitting: Physician Assistant

## 2017-10-19 ENCOUNTER — Other Ambulatory Visit: Payer: Self-pay

## 2017-10-19 ENCOUNTER — Inpatient Hospital Stay (HOSPITAL_COMMUNITY): Payer: Medicare Other | Admitting: Anesthesiology

## 2017-10-19 ENCOUNTER — Encounter (HOSPITAL_COMMUNITY)
Admission: RE | Disposition: A | Discharge status: Home / Self Care | Payer: Self-pay | Source: Ambulatory Visit | Attending: Thoracic Surgery (Cardiothoracic Vascular Surgery)

## 2017-10-19 ENCOUNTER — Inpatient Hospital Stay (HOSPITAL_COMMUNITY): Payer: Medicare Other | Admitting: Emergency Medicine

## 2017-10-19 DIAGNOSIS — K219 Gastro-esophageal reflux disease without esophagitis: Secondary | ICD-10-CM | POA: Diagnosis not present

## 2017-10-19 DIAGNOSIS — Z87442 Personal history of urinary calculi: Secondary | ICD-10-CM

## 2017-10-19 DIAGNOSIS — Z006 Encounter for examination for normal comparison and control in clinical research program: Secondary | ICD-10-CM | POA: Diagnosis not present

## 2017-10-19 DIAGNOSIS — E890 Postprocedural hypothyroidism: Secondary | ICD-10-CM | POA: Diagnosis not present

## 2017-10-19 DIAGNOSIS — Z800 Family history of malignant neoplasm of digestive organs: Secondary | ICD-10-CM | POA: Diagnosis not present

## 2017-10-19 DIAGNOSIS — Z8249 Family history of ischemic heart disease and other diseases of the circulatory system: Secondary | ICD-10-CM

## 2017-10-19 DIAGNOSIS — Z952 Presence of prosthetic heart valve: Secondary | ICD-10-CM

## 2017-10-19 DIAGNOSIS — I10 Essential (primary) hypertension: Secondary | ICD-10-CM | POA: Diagnosis present

## 2017-10-19 DIAGNOSIS — M810 Age-related osteoporosis without current pathological fracture: Secondary | ICD-10-CM | POA: Diagnosis present

## 2017-10-19 DIAGNOSIS — Z9049 Acquired absence of other specified parts of digestive tract: Secondary | ICD-10-CM | POA: Diagnosis not present

## 2017-10-19 DIAGNOSIS — I2510 Atherosclerotic heart disease of native coronary artery without angina pectoris: Secondary | ICD-10-CM | POA: Diagnosis not present

## 2017-10-19 DIAGNOSIS — Z90710 Acquired absence of both cervix and uterus: Secondary | ICD-10-CM

## 2017-10-19 DIAGNOSIS — Z981 Arthrodesis status: Secondary | ICD-10-CM

## 2017-10-19 DIAGNOSIS — K5730 Diverticulosis of large intestine without perforation or abscess without bleeding: Secondary | ICD-10-CM | POA: Diagnosis present

## 2017-10-19 DIAGNOSIS — Z91048 Other nonmedicinal substance allergy status: Secondary | ICD-10-CM

## 2017-10-19 DIAGNOSIS — Z86010 Personal history of colonic polyps: Secondary | ICD-10-CM

## 2017-10-19 DIAGNOSIS — Z9842 Cataract extraction status, left eye: Secondary | ICD-10-CM

## 2017-10-19 DIAGNOSIS — Z7982 Long term (current) use of aspirin: Secondary | ICD-10-CM

## 2017-10-19 DIAGNOSIS — Z9841 Cataract extraction status, right eye: Secondary | ICD-10-CM

## 2017-10-19 DIAGNOSIS — G4733 Obstructive sleep apnea (adult) (pediatric): Secondary | ICD-10-CM | POA: Diagnosis present

## 2017-10-19 DIAGNOSIS — Z87891 Personal history of nicotine dependence: Secondary | ICD-10-CM | POA: Diagnosis not present

## 2017-10-19 DIAGNOSIS — I700 Atherosclerosis of aorta: Secondary | ICD-10-CM | POA: Diagnosis present

## 2017-10-19 DIAGNOSIS — M797 Fibromyalgia: Secondary | ICD-10-CM | POA: Diagnosis not present

## 2017-10-19 DIAGNOSIS — I350 Nonrheumatic aortic (valve) stenosis: Secondary | ICD-10-CM

## 2017-10-19 DIAGNOSIS — Z961 Presence of intraocular lens: Secondary | ICD-10-CM | POA: Diagnosis not present

## 2017-10-19 DIAGNOSIS — Z954 Presence of other heart-valve replacement: Secondary | ICD-10-CM | POA: Diagnosis not present

## 2017-10-19 DIAGNOSIS — I352 Nonrheumatic aortic (valve) stenosis with insufficiency: Secondary | ICD-10-CM | POA: Diagnosis not present

## 2017-10-19 DIAGNOSIS — M1990 Unspecified osteoarthritis, unspecified site: Secondary | ICD-10-CM | POA: Diagnosis present

## 2017-10-19 DIAGNOSIS — Z888 Allergy status to other drugs, medicaments and biological substances status: Secondary | ICD-10-CM

## 2017-10-19 DIAGNOSIS — E782 Mixed hyperlipidemia: Secondary | ICD-10-CM | POA: Diagnosis not present

## 2017-10-19 DIAGNOSIS — D649 Anemia, unspecified: Secondary | ICD-10-CM | POA: Diagnosis present

## 2017-10-19 DIAGNOSIS — Z7983 Long term (current) use of bisphosphonates: Secondary | ICD-10-CM

## 2017-10-19 HISTORY — DX: Presence of prosthetic heart valve: Z95.2

## 2017-10-19 HISTORY — DX: Nonrheumatic aortic (valve) stenosis: I35.0

## 2017-10-19 HISTORY — PX: TRANSCATHETER AORTIC VALVE REPLACEMENT, TRANSFEMORAL: SHX6400

## 2017-10-19 HISTORY — PX: TEE WITHOUT CARDIOVERSION: SHX5443

## 2017-10-19 LAB — POCT I-STAT, CHEM 8
BUN: 8 mg/dL (ref 8–23)
BUN: 8 mg/dL (ref 8–23)
BUN: 7 mg/dL — ABNORMAL LOW (ref 8–23)
BUN: 8 mg/dL (ref 8–23)
CALCIUM ION: 1.18 mmol/L (ref 1.15–1.40)
CALCIUM ION: 1.23 mmol/L (ref 1.15–1.40)
CHLORIDE: 106 mmol/L (ref 98–111)
CHLORIDE: 106 mmol/L (ref 98–111)
CREATININE: 0.6 mg/dL (ref 0.44–1.00)
Calcium, Ion: 1.27 mmol/L (ref 1.15–1.40)
Calcium, Ion: 1.22 mmol/L (ref 1.15–1.40)
Chloride: 106 mmol/L (ref 98–111)
Chloride: 106 mmol/L (ref 98–111)
Creatinine, Ser: 0.6 mg/dL (ref 0.44–1.00)
Creatinine, Ser: 0.6 mg/dL (ref 0.44–1.00)
Creatinine, Ser: 0.6 mg/dL (ref 0.44–1.00)
GLUCOSE: 118 mg/dL — AB (ref 70–99)
Glucose, Bld: 112 mg/dL — ABNORMAL HIGH (ref 70–99)
Glucose, Bld: 118 mg/dL — ABNORMAL HIGH (ref 70–99)
Glucose, Bld: 121 mg/dL — ABNORMAL HIGH (ref 70–99)
HCT: 33 % — ABNORMAL LOW (ref 36.0–46.0)
HCT: 31 % — ABNORMAL LOW (ref 36.0–46.0)
HEMATOCRIT: 31 % — AB (ref 36.0–46.0)
HEMATOCRIT: 33 % — AB (ref 36.0–46.0)
HEMOGLOBIN: 11.2 g/dL — AB (ref 12.0–15.0)
Hemoglobin: 11.2 g/dL — ABNORMAL LOW (ref 12.0–15.0)
Hemoglobin: 10.5 g/dL — ABNORMAL LOW (ref 12.0–15.0)
Hemoglobin: 10.5 g/dL — ABNORMAL LOW (ref 12.0–15.0)
Potassium: 4.3 mmol/L (ref 3.5–5.1)
Potassium: 4.3 mmol/L (ref 3.5–5.1)
Potassium: 4.3 mmol/L (ref 3.5–5.1)
Potassium: 4.6 mmol/L (ref 3.5–5.1)
SODIUM: 141 mmol/L (ref 135–145)
SODIUM: 141 mmol/L (ref 135–145)
SODIUM: 141 mmol/L (ref 135–145)
Sodium: 143 mmol/L (ref 135–145)
TCO2: 28 mmol/L (ref 22–32)
TCO2: 27 mmol/L (ref 22–32)
TCO2: 25 mmol/L (ref 22–32)
TCO2: 27 mmol/L (ref 22–32)

## 2017-10-19 LAB — BLOOD GAS, ARTERIAL
ACID-BASE DEFICIT: 0.1 mmol/L (ref 0.0–2.0)
Bicarbonate: 24 mmol/L (ref 20.0–28.0)
FIO2: 21
O2 Saturation: 99.1 %
PH ART: 7.409 (ref 7.350–7.450)
Patient temperature: 98.6
pCO2 arterial: 38.7 mmHg (ref 32.0–48.0)
pO2, Arterial: 190 mmHg — ABNORMAL HIGH (ref 83.0–108.0)

## 2017-10-19 LAB — POCT I-STAT 3, ART BLOOD GAS (G3+)
Bicarbonate: 25.5 mmol/L (ref 20.0–28.0)
O2 Saturation: 100 %
PCO2 ART: 42.9 mmHg (ref 32.0–48.0)
TCO2: 27 mmol/L (ref 22–32)
pH, Arterial: 7.382 (ref 7.350–7.450)
pO2, Arterial: 212 mmHg — ABNORMAL HIGH (ref 83.0–108.0)

## 2017-10-19 LAB — POCT ACTIVATED CLOTTING TIME
ACTIVATED CLOTTING TIME: 131 s
ACTIVATED CLOTTING TIME: 279 s
ACTIVATED CLOTTING TIME: 114 s

## 2017-10-19 SURGERY — IMPLANTATION, AORTIC VALVE, TRANSCATHETER, FEMORAL APPROACH
Anesthesia: Monitor Anesthesia Care | Site: Groin | Laterality: Bilateral

## 2017-10-19 MED ORDER — SODIUM CHLORIDE 0.9 % IV SOLN
INTRAVENOUS | Status: AC
  Filled 2017-10-19 (×3): qty 1.2

## 2017-10-19 MED ORDER — CLOPIDOGREL BISULFATE 75 MG PO TABS
75.0000 mg | ORAL_TABLET | Freq: Every day | ORAL | Status: DC
  Administered 2017-10-20 – 2017-10-21 (×2): 75 mg via ORAL
  Filled 2017-10-19 (×2): qty 1

## 2017-10-19 MED ORDER — PROTAMINE SULFATE 10 MG/ML IV SOLN
INTRAVENOUS | Status: DC | PRN
  Administered 2017-10-19 (×2): 50 mg via INTRAVENOUS
  Administered 2017-10-19: 10 mg via INTRAVENOUS

## 2017-10-19 MED ORDER — OXYCODONE HCL 5 MG/5ML PO SOLN: 5.0000 mg | Freq: Once | ORAL | Status: DC | PRN

## 2017-10-19 MED ORDER — PROPOFOL 10 MG/ML IV BOLUS
INTRAVENOUS | Status: AC
  Filled 2017-10-19: qty 20

## 2017-10-19 MED ORDER — SODIUM CHLORIDE 0.9 % IV SOLN
INTRAVENOUS | Status: DC | PRN
  Administered 2017-10-19: 500 mL

## 2017-10-19 MED ORDER — SUCCINYLCHOLINE CHLORIDE 200 MG/10ML IV SOSY
PREFILLED_SYRINGE | INTRAVENOUS | Status: AC
  Filled 2017-10-19: qty 10

## 2017-10-19 MED ORDER — SODIUM CHLORIDE 0.9 % IV SOLN: INTRAVENOUS | Status: DC

## 2017-10-19 MED ORDER — FENTANYL CITRATE (PF) 250 MCG/5ML IJ SOLN
INTRAMUSCULAR | Status: DC | PRN
  Administered 2017-10-19: 100 ug via INTRAVENOUS

## 2017-10-19 MED ORDER — SODIUM CHLORIDE 0.9 % IV SOLN
INTRAVENOUS | Status: DC
  Administered 2017-10-19: 50 mL/h via INTRAVENOUS

## 2017-10-19 MED ORDER — MORPHINE SULFATE (PF) 2 MG/ML IV SOLN: 2.0000 mg | INTRAVENOUS | Status: DC | PRN

## 2017-10-19 MED ORDER — CHLORHEXIDINE GLUCONATE 4 % EX LIQD: 60.0000 mL | Freq: Once | CUTANEOUS | Status: DC

## 2017-10-19 MED ORDER — CHLORHEXIDINE GLUCONATE 4 % EX LIQD: 30.0000 mL | CUTANEOUS | Status: DC

## 2017-10-19 MED ORDER — HEPARIN SODIUM (PORCINE) 1000 UNIT/ML IJ SOLN
INTRAMUSCULAR | Status: DC | PRN
  Administered 2017-10-19: 11000 [IU] via INTRAVENOUS

## 2017-10-19 MED ORDER — SODIUM CHLORIDE 0.9 % IV SOLN
1.5000 g | Freq: Two times a day (BID) | INTRAVENOUS | Status: AC
  Administered 2017-10-19 – 2017-10-21 (×4): 1.5 g via INTRAVENOUS
  Filled 2017-10-19 (×4): qty 1.5

## 2017-10-19 MED ORDER — ROCURONIUM BROMIDE 10 MG/ML (PF) SYRINGE
PREFILLED_SYRINGE | INTRAVENOUS | Status: AC
  Filled 2017-10-19: qty 10

## 2017-10-19 MED ORDER — TRAMADOL HCL 50 MG PO TABS
50.0000 mg | ORAL_TABLET | ORAL | Status: DC | PRN
  Administered 2017-10-20: 50 mg via ORAL
  Filled 2017-10-19: qty 1

## 2017-10-19 MED ORDER — CHLORHEXIDINE GLUCONATE 0.12 % MT SOLN
15.0000 mL | Freq: Once | OROMUCOSAL | Status: AC
  Administered 2017-10-19: 15 mL via OROMUCOSAL
  Filled 2017-10-19: qty 15

## 2017-10-19 MED ORDER — EPHEDRINE SULFATE 50 MG/ML IJ SOLN
INTRAMUSCULAR | Status: AC
  Filled 2017-10-19: qty 1

## 2017-10-19 MED ORDER — PHENYLEPHRINE HCL 10 MG/ML IJ SOLN
INTRAMUSCULAR | Status: DC | PRN
  Administered 2017-10-19: 50 ug/min via INTRAVENOUS

## 2017-10-19 MED ORDER — SODIUM CHLORIDE 0.9% FLUSH
3.0000 mL | Freq: Two times a day (BID) | INTRAVENOUS | Status: DC
  Administered 2017-10-20 (×2): 3 mL via INTRAVENOUS

## 2017-10-19 MED ORDER — LIDOCAINE HCL (PF) 1 % IJ SOLN
INTRAMUSCULAR | Status: DC | PRN
  Administered 2017-10-19: 8 mL

## 2017-10-19 MED ORDER — PROTAMINE SULFATE 10 MG/ML IV SOLN
INTRAVENOUS | Status: AC
  Filled 2017-10-19: qty 25

## 2017-10-19 MED ORDER — LACTATED RINGERS IV SOLN
INTRAVENOUS | Status: DC | PRN
  Administered 2017-10-19: 07:00:00 via INTRAVENOUS

## 2017-10-19 MED ORDER — ROSUVASTATIN CALCIUM 5 MG PO TABS
5.0000 mg | ORAL_TABLET | Freq: Every day | ORAL | Status: DC
  Administered 2017-10-19 – 2017-10-20 (×2): 5 mg via ORAL
  Filled 2017-10-19 (×3): qty 1

## 2017-10-19 MED ORDER — ESMOLOL HCL 100 MG/10ML IV SOLN
INTRAVENOUS | Status: AC
  Filled 2017-10-19: qty 10

## 2017-10-19 MED ORDER — NITROGLYCERIN IN D5W 200-5 MCG/ML-% IV SOLN: 0.0000 ug/min | INTRAVENOUS | Status: DC

## 2017-10-19 MED ORDER — IODIXANOL 320 MG/ML IV SOLN
INTRAVENOUS | Status: DC | PRN
  Administered 2017-10-19: 55 mL via INTRA_ARTERIAL

## 2017-10-19 MED ORDER — ASPIRIN 81 MG PO CHEW
81.0000 mg | CHEWABLE_TABLET | Freq: Every day | ORAL | Status: DC
  Administered 2017-10-20 – 2017-10-21 (×2): 81 mg via ORAL
  Filled 2017-10-19 (×2): qty 1

## 2017-10-19 MED ORDER — ACETAMINOPHEN 650 MG RE SUPP: 650.0000 mg | Freq: Four times a day (QID) | RECTAL | Status: DC | PRN

## 2017-10-19 MED ORDER — METOPROLOL TARTRATE 5 MG/5ML IV SOLN: 2.5000 mg | INTRAVENOUS | Status: DC | PRN

## 2017-10-19 MED ORDER — ONDANSETRON HCL 4 MG/2ML IJ SOLN: 4.0000 mg | Freq: Once | INTRAMUSCULAR | Status: DC | PRN

## 2017-10-19 MED ORDER — FENTANYL CITRATE (PF) 100 MCG/2ML IJ SOLN: 25.0000 ug | INTRAMUSCULAR | Status: DC | PRN

## 2017-10-19 MED ORDER — PHENYLEPHRINE HCL-NACL 20-0.9 MG/250ML-% IV SOLN
0.0000 ug/min | INTRAVENOUS | Status: DC
  Filled 2017-10-19: qty 250

## 2017-10-19 MED ORDER — ONDANSETRON HCL 4 MG/2ML IJ SOLN: 4.0000 mg | Freq: Four times a day (QID) | INTRAMUSCULAR | Status: DC | PRN

## 2017-10-19 MED ORDER — ACETAMINOPHEN 325 MG PO TABS
650.0000 mg | ORAL_TABLET | Freq: Four times a day (QID) | ORAL | Status: DC | PRN
  Administered 2017-10-19 – 2017-10-21 (×5): 650 mg via ORAL
  Filled 2017-10-19 (×5): qty 2

## 2017-10-19 MED ORDER — SODIUM CHLORIDE 0.9 % IV SOLN
250.0000 mL | INTRAVENOUS | Status: DC | PRN
  Administered 2017-10-20: 250 mL via INTRAVENOUS

## 2017-10-19 MED ORDER — OXYCODONE HCL 5 MG PO TABS: 5.0000 mg | ORAL_TABLET | ORAL | Status: DC | PRN

## 2017-10-19 MED ORDER — LIDOCAINE HCL (PF) 1 % IJ SOLN
INTRAMUSCULAR | Status: AC
  Filled 2017-10-19: qty 30

## 2017-10-19 MED ORDER — FENTANYL CITRATE (PF) 250 MCG/5ML IJ SOLN
INTRAMUSCULAR | Status: AC
  Filled 2017-10-19: qty 5

## 2017-10-19 MED ORDER — OXYCODONE HCL 5 MG PO TABS: 5.0000 mg | ORAL_TABLET | Freq: Once | ORAL | Status: DC | PRN

## 2017-10-19 MED ORDER — HEPARIN SODIUM (PORCINE) 1000 UNIT/ML IJ SOLN
INTRAMUSCULAR | Status: AC
  Filled 2017-10-19: qty 1

## 2017-10-19 MED ORDER — MIDAZOLAM HCL 2 MG/2ML IJ SOLN
INTRAMUSCULAR | Status: AC
  Filled 2017-10-19: qty 2

## 2017-10-19 MED ORDER — 0.9 % SODIUM CHLORIDE (POUR BTL) OPTIME
TOPICAL | Status: DC | PRN
  Administered 2017-10-19: 2000 mL

## 2017-10-19 MED ORDER — PHENYLEPHRINE 40 MCG/ML (10ML) SYRINGE FOR IV PUSH (FOR BLOOD PRESSURE SUPPORT)
PREFILLED_SYRINGE | INTRAVENOUS | Status: AC
  Filled 2017-10-19: qty 10

## 2017-10-19 MED ORDER — SODIUM CHLORIDE 0.9% FLUSH: 3.0000 mL | INTRAVENOUS | Status: DC | PRN

## 2017-10-19 MED ORDER — VANCOMYCIN HCL IN DEXTROSE 1-5 GM/200ML-% IV SOLN
1000.0000 mg | Freq: Once | INTRAVENOUS | Status: AC
  Administered 2017-10-19: 1000 mg via INTRAVENOUS
  Filled 2017-10-19: qty 200

## 2017-10-19 MED ORDER — MORPHINE SULFATE (PF) 10 MG/ML IV SOLN: 2.0000 mg | INTRAVENOUS | Status: DC | PRN

## 2017-10-19 SURGICAL SUPPLY — 95 items
BAG DECANTER FOR FLEXI CONT (MISCELLANEOUS) ×6 IMPLANT
BAG SNAP BAND KOVER 36X36 (MISCELLANEOUS) ×3 IMPLANT
BLADE CLIPPER SURG (BLADE) ×3 IMPLANT
BLADE STERNUM SYSTEM 6 (BLADE) IMPLANT
CABLE ADAPT CONN TEMP 6FT (ADAPTER) ×3 IMPLANT
CANISTER SUCT 3000ML PPV (MISCELLANEOUS) ×3 IMPLANT
CANNULA FEM VENOUS REMOTE 22FR (CANNULA) IMPLANT
CANNULA OPTISITE PERFUSION 16F (CANNULA) IMPLANT
CANNULA OPTISITE PERFUSION 18F (CANNULA) IMPLANT
CATH DIAG EXPO 6F VENT PIG 145 (CATHETERS) ×6 IMPLANT
CATH EXPO 5FR AL1 (CATHETERS) ×3 IMPLANT
CATH INFINITI 6F AL2 (CATHETERS) IMPLANT
CATH S G BIP PACING (SET/KITS/TRAYS/PACK) ×3 IMPLANT
CLIP VESOCCLUDE MED 24/CT (CLIP) ×3 IMPLANT
CLIP VESOCCLUDE SM WIDE 24/CT (CLIP) ×3 IMPLANT
CONT SPEC 4OZ CLIKSEAL STRL BL (MISCELLANEOUS) ×6 IMPLANT
COVER BACK TABLE 80X110 HD (DRAPES) ×6 IMPLANT
COVER DOME SNAP 22 D (MISCELLANEOUS) IMPLANT
COVER SURGICAL LIGHT HANDLE (MISCELLANEOUS) ×3 IMPLANT
CRADLE DONUT ADULT HEAD (MISCELLANEOUS) ×3 IMPLANT
DERMABOND ADHESIVE PROPEN (GAUZE/BANDAGES/DRESSINGS) ×2
DERMABOND ADVANCED (GAUZE/BANDAGES/DRESSINGS) ×2
DERMABOND ADVANCED .7 DNX12 (GAUZE/BANDAGES/DRESSINGS) ×1 IMPLANT
DERMABOND ADVANCED .7 DNX6 (GAUZE/BANDAGES/DRESSINGS) ×1 IMPLANT
DEVICE CLOSURE PERCLS PRGLD 6F (VASCULAR PRODUCTS) ×2 IMPLANT
DRAPE INCISE IOBAN 66X45 STRL (DRAPES) IMPLANT
DRSG TEGADERM 4X4.75 (GAUZE/BANDAGES/DRESSINGS) ×3 IMPLANT
ELECT CAUTERY BLADE 6.4 (BLADE) IMPLANT
ELECT REM PT RETURN 9FT ADLT (ELECTROSURGICAL) ×6
ELECTRODE REM PT RTRN 9FT ADLT (ELECTROSURGICAL) ×2 IMPLANT
FELT TEFLON 6X6 (MISCELLANEOUS) ×3 IMPLANT
FEMORAL VENOUS CANN RAP (CANNULA) IMPLANT
GAUZE SPONGE 2X2 8PLY STRL LF (GAUZE/BANDAGES/DRESSINGS) ×1 IMPLANT
GAUZE SPONGE 4X4 12PLY STRL (GAUZE/BANDAGES/DRESSINGS) ×3 IMPLANT
GAUZE SPONGE 4X4 12PLY STRL LF (GAUZE/BANDAGES/DRESSINGS) ×3 IMPLANT
GLOVE BIO SURGEON STRL SZ7.5 (GLOVE) IMPLANT
GLOVE BIO SURGEON STRL SZ8 (GLOVE) IMPLANT
GLOVE EUDERMIC 7 POWDERFREE (GLOVE) IMPLANT
GLOVE ORTHO TXT STRL SZ7.5 (GLOVE) IMPLANT
GOWN STRL REUS W/ TWL LRG LVL3 (GOWN DISPOSABLE) IMPLANT
GOWN STRL REUS W/ TWL XL LVL3 (GOWN DISPOSABLE) ×1 IMPLANT
GOWN STRL REUS W/TWL LRG LVL3 (GOWN DISPOSABLE)
GOWN STRL REUS W/TWL XL LVL3 (GOWN DISPOSABLE) ×2
GUIDEWIRE SAF TJ AMPL .035X180 (WIRE) ×3 IMPLANT
GUIDEWIRE SAFE TJ AMPLATZ EXST (WIRE) ×6 IMPLANT
GUIDEWIRE STRAIGHT .035 260CM (WIRE) ×3 IMPLANT
INSERT FOGARTY SM (MISCELLANEOUS) IMPLANT
KIT BASIN OR (CUSTOM PROCEDURE TRAY) ×3 IMPLANT
KIT DILATOR VASC 18G NDL (KITS) IMPLANT
KIT HEART LEFT (KITS) ×3 IMPLANT
KIT SUCTION CATH 14FR (SUCTIONS) ×6 IMPLANT
KIT TURNOVER KIT B (KITS) ×3 IMPLANT
LOOP VESSEL MAXI BLUE (MISCELLANEOUS) IMPLANT
LOOP VESSEL MINI RED (MISCELLANEOUS) IMPLANT
NEEDLE PERC 18GX7CM (NEEDLE) ×3 IMPLANT
NS IRRIG 1000ML POUR BTL (IV SOLUTION) ×9 IMPLANT
PACK ENDOVASCULAR (PACKS) ×3 IMPLANT
PAD ARMBOARD 7.5X6 YLW CONV (MISCELLANEOUS) ×6 IMPLANT
PAD ELECT DEFIB RADIOL ZOLL (MISCELLANEOUS) ×3 IMPLANT
PENCIL BUTTON HOLSTER BLD 10FT (ELECTRODE) ×3 IMPLANT
PERCLOSE PROGLIDE 6F (VASCULAR PRODUCTS) ×6
SET MICROPUNCTURE 5F STIFF (MISCELLANEOUS) ×3 IMPLANT
SHEATH AVANTI 11CM 5FR (SHEATH) ×3 IMPLANT
SHEATH BRITE TIP 6FR 35CM (SHEATH) ×3 IMPLANT
SHEATH PINNACLE 6F 10CM (SHEATH) IMPLANT
SHEATH PINNACLE 8F 10CM (SHEATH) ×3 IMPLANT
SLEEVE REPOSITIONING LENGTH 30 (MISCELLANEOUS) ×3 IMPLANT
SPONGE GAUZE 2X2 STER 10/PKG (GAUZE/BANDAGES/DRESSINGS) ×2
SPONGE LAP 4X18 RFD (DISPOSABLE) ×3 IMPLANT
STOPCOCK MORSE 400PSI 3WAY (MISCELLANEOUS) ×6 IMPLANT
SUT ETHIBOND X763 2 0 SH 1 (SUTURE) IMPLANT
SUT GORETEX CV 4 TH 22 36 (SUTURE) IMPLANT
SUT GORETEX CV4 TH-18 (SUTURE) IMPLANT
SUT MNCRL AB 3-0 PS2 18 (SUTURE) IMPLANT
SUT PROLENE 5 0 C 1 36 (SUTURE) IMPLANT
SUT PROLENE 6 0 C 1 30 (SUTURE) IMPLANT
SUT SILK  1 MH (SUTURE) ×2
SUT SILK 1 MH (SUTURE) ×1 IMPLANT
SUT VIC AB 2-0 CT1 27 (SUTURE)
SUT VIC AB 2-0 CT1 TAPERPNT 27 (SUTURE) IMPLANT
SUT VIC AB 2-0 CTX 36 (SUTURE) IMPLANT
SUT VIC AB 3-0 SH 8-18 (SUTURE) IMPLANT
SYR 50ML LL SCALE MARK (SYRINGE) ×3 IMPLANT
SYR BULB IRRIGATION 50ML (SYRINGE) IMPLANT
SYR CONTROL 10ML LL (SYRINGE) IMPLANT
TAPE CLOTH SURG 6X10 WHT LF (GAUZE/BANDAGES/DRESSINGS) ×3 IMPLANT
TOWEL GREEN STERILE (TOWEL DISPOSABLE) ×6 IMPLANT
TRANSCATH VALVE HEART SZ3 20 (Valve) ×3 IMPLANT
TRANSDUCER W/STOPCOCK (MISCELLANEOUS) ×6 IMPLANT
TRAY FOLEY SLVR 14FR TEMP STAT (SET/KITS/TRAYS/PACK) IMPLANT
TUBE SUCT INTRACARD DLP 20F (MISCELLANEOUS) IMPLANT
VALVE TRANSCATH HEART SZ3 20 (Valve) ×1 IMPLANT
WIRE .035 3MM-J 145CM (WIRE) ×3 IMPLANT
WIRE BENTSON .035X145CM (WIRE) ×3 IMPLANT
WIRE EMERALD 3MM-J .035X150CM (WIRE) ×3 IMPLANT

## 2017-10-19 NOTE — Anesthesia Postprocedure Evaluation (Signed)
Anesthesia Post Note  Patient: [name]  Procedure(s) Performed: TRANSCATHETER AORTIC VALVE REPLACEMENT, TRANSFEMORAL (Bilateral Groin) TRANSESOPHAGEAL ECHOCARDIOGRAM (TEE) (Bilateral Groin)     Patient location during evaluation: Cath Lab Anesthesia Type: MAC Level of consciousness: awake and alert Pain management: pain level controlled Vital Signs Assessment: post-procedure vital signs reviewed and stable Respiratory status: spontaneous breathing, nonlabored ventilation, respiratory function stable and patient connected to nasal cannula oxygen Cardiovascular status: stable and blood pressure returned to baseline Postop Assessment: no apparent nausea or vomiting Anesthetic complications: no    Last Vitals:  Vitals:   10/19/17 1400 10/19/17 1743  BP: 93/79 (!) 133/51  Pulse: (!) 52   Resp: 16 17  Temp:  36.6 C  SpO2: 97% 97%    Last Pain:  Vitals:   10/19/17 1743  TempSrc: Oral  PainSc:                  Vanderbilt Ranieri COKER

## 2017-10-19 NOTE — Anesthesia Preprocedure Evaluation (Signed)
Anesthesia Evaluation  Patient identified by MRN, date of birth, ID band Patient awake    Reviewed: Allergy & Precautions, NPO status , Patient's Chart, lab work & pertinent test results  Airway Mallampati: II  TM Distance: >3 FB Neck ROM: Full    Dental  (+) Teeth Intact, Dental Advisory Given   Pulmonary former smoker,    breath sounds clear to auscultation       Cardiovascular hypertension,  Rhythm:Regular Rate:Normal     Neuro/Psych    GI/Hepatic   Endo/Other    Renal/GU      Musculoskeletal   Abdominal   Peds  Hematology   Anesthesia Other Findings   Reproductive/Obstetrics                             Anesthesia Physical Anesthesia Plan  ASA: III  Anesthesia Plan: MAC   Post-op Pain Management:    Induction: Intravenous  PONV Risk Score and Plan: Ondansetron  Airway Management Planned: Natural Airway and Simple Face Mask  Additional Equipment: Arterial line and CVP  Intra-op Plan:   Post-operative Plan:   Informed Consent: I have reviewed the patients History and Physical, chart, labs and discussed the procedure including the risks, benefits and alternatives for the proposed anesthesia with the patient or authorized representative who has indicated his/her understanding and acceptance.     Plan Discussed with: CRNA and Anesthesiologist  Anesthesia Plan Comments:         Anesthesia Quick Evaluation

## 2017-10-19 NOTE — Progress Notes (Signed)
6 french sheath removed from left femoral vein.  6 and 5 french sheath removed from left femoral artery.  Pressure held x 20 minutes.  Site dressed with 4x4 and tegaderm.  Vitals stable throughout sheath removal with HR 48, ABP-110/45, and SATS-98 on 2 lpm .  Site looks good level 0 with no hematoma.  Bedrest instructions given but patient still very sedated.  RN will continue to monitor site.

## 2017-10-19 NOTE — Anesthesia Procedure Notes (Signed)
Arterial Line Insertion Start/End7/12/2017 6:45 AM, 10/19/2017 7:00 AM Performed by: Carney Living, CRNA, CRNA  Patient location: Pre-op. Preanesthetic checklist: patient identified, IV checked, site marked, risks and benefits discussed, surgical consent, monitors and equipment checked, pre-op evaluation, timeout performed and anesthesia consent Lidocaine 1% used for infiltration Right, radial was placed Catheter size: 20 G Hand hygiene performed  and maximum sterile barriers used  Allen's test indicative of satisfactory collateral circulation Attempts: 1 Procedure performed without using ultrasound guided technique. Following insertion, dressing applied and Biopatch. Post procedure assessment: normal  Patient tolerated the procedure well with no immediate complications.

## 2017-10-19 NOTE — Op Note (Signed)
[facility]   MULTIDISCIPLINARY HEART VALVE TEAM   TAVR OPERATIVE NOTE   Date of Procedure:  10/19/2017  Preoperative Diagnosis: Severe Aortic Stenosis   Postoperative Diagnosis: Same   Procedure:    Transcatheter Aortic Valve Replacement - Percutaneous Right Transfemoral Approach  Edwards Sapien 3 THV (size 20 mm, model # 9600TFX, serial # [id number])   Co-Surgeons:  Valentina Gu. Roxy Manns, MD and Sherren Mocha, MD  Anesthesiologist:  Roberts Gaudy, MD  Echocardiographer:  Ena Dawley, MD  Pre-operative Echo Findings:  Severe aortic stenosis  Normal left ventricular systolic function  Post-operative Echo Findings:  Mild (1+) paravalvular leak  Normal left ventricular systolic function   BRIEF CLINICAL NOTE AND INDICATIONS FOR SURGERY  The patient is an 82 year old woman with history of hypertension, hyperlipidemia, and fibromyalgia who is had some atypical chest pain in the past with a normal cath in 2008 and normal Myoview in 2009.  It was felt that her chest pain was most likely due to fibromyalgia.  She also has a history of aortic stenosis that has been followed with periodic echocardiograms.  Her echo in 02/2017 showed moderate to severe aortic stenosis with a mean gradient of 32 mmHg and a dimensionless index of 0.29.  There is trivial aortic insufficiency at that time.  Left ventricular ejection fraction was 60 to 65%.  She now reports that over the past several months she has had progressive exertional fatigue and shortness of breath.  She has always been quite active but recently has had to take frequent rest breaks.  She also reports some exertional chest pressure.  She has had no dizziness or syncope.  She has some chronic swelling in her left lower extremity that she has had for many years but denies any swelling in her right lower extremity.  She underwent a repeat echocardiogram on 09/07/2017 which showed progression of her aortic stenosis with a mean  gradient of 40 mmHg and a peak gradient of 74 mmHg.  The dimensionless index was 0.26.  Left ventricular ejection fraction remained 65 to 70% with grade 1 diastolic dysfunction.  She underwent cardiac catheterization by Dr. Burt Knack on 09/29/2017 and this showed widely patent coronary arteries with minor luminal irregularities.  There is mild elevation of her right heart pressures with a PA pressure of 37/17 and mean wedge pressure of 20.  During the course of the patient's preoperative work up they have been evaluated comprehensively by a multidisciplinary team of specialists coordinated through the [facility] in the [facility].  They have been demonstrated to suffer from symptomatic severe aortic stenosis as noted above. The patient has been counseled extensively as to the relative risks and benefits of all options for the treatment of severe aortic stenosis including long term medical therapy, conventional surgery for aortic valve replacement, and transcatheter aortic valve replacement.  All questions have been answered, and the patient provides full informed consent for the operation as described.   DETAILS OF THE OPERATIVE PROCEDURE  PREPARATION:    The patient is brought to the operating room on the above mentioned date and central monitoring was established by the anesthesia team including placement of a central venous line and radial arterial line. The patient is placed in the supine position on the operating table.  Intravenous antibiotics are administered. The patient is monitored closely throughout the procedure under conscious sedation.  Baseline transthoracic echocardiogram was performed. The patient's chest, abdomen, both groins, and both lower extremities  are prepared and draped in a sterile manner. A time out procedure is performed.   PERIPHERAL ACCESS:    Using the modified Seldinger technique, femoral arterial and venous access was  obtained with placement of 6 Fr sheaths on the left side.  An extra 5 Fr arterial sheath was placed when the femoral artery was cannulated a second time.  A pigtail diagnostic catheter was passed through the left arterial sheath under fluoroscopic guidance into the aortic root.  A temporary transvenous pacemaker catheter was passed through the left femoral venous sheath under fluoroscopic guidance into the right ventricle.  The pacemaker was tested to ensure stable lead placement and pacemaker capture. Aortic root angiography was performed in order to determine the optimal angiographic angle for valve deployment.   TRANSFEMORAL ACCESS:   Percutaneous transfemoral access and sheath placement was performed using ultrasound guidance.  The right common femoral artery was cannulated using a micropuncture needle and appropriate location was verified using hand injection angiogram.  A pair of Abbott Perclose percutaneous closure devices were placed and a 6 French sheath replaced into the femoral artery.  The patient was heparinized systemically and ACT verified > 250 seconds.    A 14 Fr transfemoral E-sheath was introduced into the right common femoral artery after progressively dilating over an Amplatz superstiff wire. An AL-1 catheter was used to direct a straight-tip exchange length wire across the native aortic valve into the left ventricle. This was exchanged out for a pigtail catheter and position was confirmed in the LV apex. Simultaneous LV and Ao pressures were recorded.  The pigtail catheter was exchanged for an Amplatz Extra-stiff wire in the LV apex.  Echocardiography was utilized to confirm appropriate wire position and no sign of entanglement in the mitral subvalvular apparatus.   TRANSCATHETER HEART VALVE DEPLOYMENT:   An Edwards Sapien 3 transcatheter heart valve (size 20 mm, model #9600TFX, serial #[id number]) was prepared and crimped per manufacturer's guidelines, and the proper orientation of  the valve is confirmed on the [employer] delivery system. The valve was advanced through the introducer sheath using normal technique until in an appropriate position in the abdominal aorta beyond the sheath tip. The balloon was then retracted and using the fine-tuning wheel was centered on the valve. The valve was then advanced across the aortic arch using appropriate flexion of the catheter. The valve was carefully positioned across the aortic valve annulus. The Commander catheter was retracted using normal technique. Once final position of the valve has been confirmed by angiographic assessment, the valve is deployed while temporarily holding ventilation and during rapid ventricular pacing to maintain systolic blood pressure < 50 mmHg and pulse pressure < 10 mmHg. The balloon inflation is held for >3 seconds after reaching full deployment volume. Once the balloon has fully deflated the balloon is retracted into the ascending aorta and valve function is assessed using echocardiography. There is felt to be mild (1+) paravalvular leak and no central aortic insufficiency.  The patient's hemodynamic recovery following valve deployment is good.  The deployment balloon and guidewire are both removed.    PROCEDURE COMPLETION:   The sheath was removed and femoral artery closure performed.  Protamine was administered once femoral arterial repair was complete. The temporary pacemaker, pigtail catheters and femoral sheaths were removed with manual pressure used for hemostasis.   The patient tolerated the procedure well and is transported to the surgical intensive care in stable condition. There were no immediate intraoperative complications. All sponge instrument and needle counts  are verified correct at completion of the operation.   No blood products were administered during the operation.  The patient received a total of 55 mL of intravenous contrast during the procedure.   Rexene Alberts,  MD 10/19/2017 9:47 AM

## 2017-10-19 NOTE — Interval H&P Note (Signed)
History and Physical Interval Note:  10/19/2017 6:25 AM  [name]  has presented today for surgery, with the diagnosis of Severe Aortic Stenosis  The various methods of treatment have been discussed with the patient and family. After consideration of risks, benefits and other options for treatment, the patient has consented to  Procedure(s): TRANSCATHETER AORTIC VALVE REPLACEMENT, TRANSFEMORAL (N/A) TRANSESOPHAGEAL ECHOCARDIOGRAM (TEE) (N/A) as a surgical intervention .  The patient's history has been reviewed, patient examined, no change in status, stable for surgery.  I have reviewed the patient's chart and labs.  Questions were answered to the patient's satisfaction.     [name]

## 2017-10-19 NOTE — Progress Notes (Signed)
Pt received from PACU. VSS. HR bradicarTelemetry applied. CHG complete. Bilateral groin sites level 0. Pt and family oriented to room and unit. Will continue to monitor.  Clyde Canterbury, RN

## 2017-10-19 NOTE — Progress Notes (Signed)
  Echocardiogram Echocardiogram Transesophageal has been performed.  [name] 10/19/2017, 10:44 AM

## 2017-10-19 NOTE — Progress Notes (Signed)
  [place] VALVE TEAM  Patient doing well s/p TAVR. She is hemodynamically stable. Groin sites stable. ECG with sinus brady and 1st degree AV block. Arterial line removed and transferred to 4E stepdown. She is still slow to wake up but doing well. Early ambulation and hopeful discharge over the next 1-2 days.   Angelena Form PA-C  [facility]  Pager [phone]

## 2017-10-19 NOTE — Anesthesia Procedure Notes (Signed)
Procedure Name: MAC Date/Time: 10/19/2017 7:30 AM Performed by: Leonor Liv, CRNA Pre-anesthesia Checklist: Emergency Drugs available, Suction available, Patient identified and Patient being monitored Patient Re-evaluated:Patient Re-evaluated prior to induction Oxygen Delivery Method: Simple face mask Placement Confirmation: positive ETCO2

## 2017-10-19 NOTE — Anesthesia Procedure Notes (Signed)
Central Venous Catheter Insertion Performed by: Roberts Gaudy, MD, anesthesiologist Start/End7/12/2017 6:55 AM, 10/19/2017 7:05 AM Patient location: Pre-op. Preanesthetic checklist: patient identified, IV checked, site marked, risks and benefits discussed, surgical consent, monitors and equipment checked, pre-op evaluation, timeout performed and anesthesia consent Lidocaine 1% used for infiltration and patient sedated Hand hygiene performed  and maximum sterile barriers used  Catheter size: 8 Fr Total catheter length 16. Central line was placed.Double lumen Procedure performed using ultrasound guided technique. Ultrasound Notes:image(s) printed for medical record Attempts: 1 Following insertion, dressing applied and line sutured. Post procedure assessment: blood return through all ports  Patient tolerated the procedure well with no immediate complications.

## 2017-10-19 NOTE — Transfer of Care (Signed)
Immediate Anesthesia Transfer of Care Note  Patient: [name]  Procedure(s) Performed: TRANSCATHETER AORTIC VALVE REPLACEMENT, TRANSFEMORAL (Bilateral Groin) TRANSESOPHAGEAL ECHOCARDIOGRAM (TEE) (Bilateral Groin)  Patient Location: Cath Lab  Anesthesia Type:MAC  Level of Consciousness: sedated  Airway & Oxygen Therapy: Patient Spontanous Breathing and Patient connected to nasal cannula oxygen  Post-op Assessment: Report given to RN and Post -op Vital signs reviewed and stable  Post vital signs: Reviewed and stable  Last Vitals:  Vitals Value Taken Time  BP 99/40 10/19/2017 10:01 AM  Temp    Pulse 57 10/19/2017 10:02 AM  Resp 14 10/19/2017 10:02 AM  SpO2 98 % 10/19/2017 10:02 AM  Vitals shown include unvalidated device data.  Last Pain:  Vitals:   10/19/17 0600  TempSrc: Oral         Complications: No apparent anesthesia complications

## 2017-10-19 NOTE — Plan of Care (Signed)
  Problem: Education: Goal: Knowledge of General Education information will improve Outcome: Progressing   Problem: Health Behavior/Discharge Planning: Goal: Ability to manage health-related needs will improve Outcome: Progressing   Problem: Clinical Measurements: Goal: Ability to maintain clinical measurements within normal limits will improve Outcome: Progressing Goal: Will remain free from infection Outcome: Progressing Goal: Diagnostic test results will improve Outcome: Progressing Goal: Cardiovascular complication will be avoided Outcome: Progressing   Problem: Activity: Goal: Risk for activity intolerance will decrease Outcome: Progressing   Problem: Nutrition: Goal: Adequate nutrition will be maintained Outcome: Progressing   Problem: Elimination: Goal: Will not experience complications related to bowel motility Outcome: Progressing Goal: Will not experience complications related to urinary retention Outcome: Progressing   Problem: Pain Managment: Goal: General experience of comfort will improve Outcome: Progressing   Problem: Safety: Goal: Ability to remain free from injury will improve Outcome: Progressing   Problem: Skin Integrity: Goal: Risk for impaired skin integrity will decrease Outcome: Progressing

## 2017-10-19 NOTE — Op Note (Signed)
[facility]   MULTIDISCIPLINARY HEART VALVE TEAM   TAVR OPERATIVE NOTE   Date of Procedure:  10/19/2017  Preoperative Diagnosis: Severe Aortic Stenosis   Postoperative Diagnosis: Same   Procedure:    Transcatheter Aortic Valve Replacement - Percutaneous  Transfemoral Approach  Edwards Sapien 3 THV (size 20 mm, model # 9600TFX, serial # [id number])   Co-Surgeons:  Valentina Gu. Roxy Manns, MD and Sherren Mocha, MD  Anesthesiologist:  Roberts Gaudy, MD  Echocardiographer:  Ena Dawley, MD  Pre-operative Echo Findings:  Severe aortic stenosis  Normal left ventricular systolic function  Post-operative Echo Findings:  Mild paravalvular leak  Normal left ventricular systolic function  **Please see the complete note of Dr Roxy Manns for full indications and operative details**  PERIPHERAL ACCESS:   Using ultrasound guidance, femoral arterial and venous access is obtained with placement of 6 Fr sheaths on the left side. The left femoral artery was accessed twice, and a 5 Fr and 6 Fr sheath was inserted.   A pigtail diagnostic catheter was passed through the 6 Fr femoral arterial sheath under fluoroscopic guidance into the aortic root.  A temporary transvenous pacemaker catheter was passed through the femoral venous sheath under fluoroscopic guidance into the right ventricle.  The pacemaker was tested to ensure stable lead placement and pacemaker capture. Aortic root angiography was performed in order to determine the optimal angiographic angle for valve deployment.  TRANSFEMORAL ACCESS:  A micropuncture technique is used to access the right femoral artery under fluoroscopic and ultrasound guidance.  2 Perclose devices are deployed at 10' and 2' positions to 'PreClose' the femoral artery. An 8 French sheath is placed and then an Amplatz Superstiff wire is advanced through the sheath. This is changed out for a 14 French transfemoral E-Sheath after progressively dilating over the  Superstiff wire.  An AL1 catheter was used to direct a straight-tip exchange length wire across the native aortic valve into the left ventricle. This was exchanged out for a pigtail catheter and position was confirmed in the LV apex. Simultaneous LV and Ao pressures were recorded.  The pigtail catheter was exchanged for an Amplatz Extra-stiff wire in the LV apex.  Echocardiography was utilized to confirm appropriate wire position and no sign of entanglement in the mitral subvalvular apparatus.  BALLOON AORTIC VALVULOPLASTY:  Not performed  TRANSCATHETER HEART VALVE DEPLOYMENT:  An Edwards Sapien 3 transcatheter heart valve (size 20 mm, model #9600TFX, serial #[id number]) was prepared and crimped per manufacturer's guidelines, and the proper orientation of the valve is confirmed on the [employer] delivery system. The valve was advanced through the introducer sheath using normal technique until in an appropriate position in the abdominal aorta beyond the sheath tip. The balloon was then retracted and using the fine-tuning wheel was centered on the valve. The valve was then advanced across the aortic arch using appropriate flexion of the catheter. The valve was carefully positioned across the aortic valve annulus. The Commander catheter was retracted using normal technique. Once final position of the valve has been confirmed by angiographic assessment, the valve is deployed while temporarily holding ventilation and during rapid ventricular pacing to maintain systolic blood pressure < 50 mmHg and pulse pressure < 10 mmHg. The balloon inflation is held for >3 seconds after reaching full deployment volume. Once the balloon has fully deflated the balloon is retracted into the ascending aorta and valve function is assessed using echocardiography. There is felt to be mild paravalvular leak and no central aortic insufficiency.  The patient's hemodynamic recovery following valve deployment is good.  The deployment  balloon and guidewire are both removed. Echo demostrated acceptable post-procedural gradients, stable mitral valve function, and mild aortic insufficiency.   PROCEDURE COMPLETION:  The sheath was removed and femoral artery closure is performed using the 2 previously deployed Perclose devices.  Protamine is administered once femoral arterial repair was complete. The site is clear with no evidence of bleeding or hematoma after the sutures are tightened. The temporary pacemaker, pigtail catheters and femoral sheaths were removed with manual pressure used for hemostasis.   The patient tolerated the procedure well and is transported to the surgical intensive care in stable condition. There were no immediate intraoperative complications. All sponge instrument and needle counts are verified correct at completion of the operation.   The patient received a total of 55 mL of intravenous contrast during the procedure.  Sherren Mocha, MD 10/19/2017 9:55 AM

## 2017-10-19 NOTE — Progress Notes (Signed)
Pt ambulated 100 ft in hallway with rolling walker and 1 assist. Tolerated well. Pt returned to recliner. No complaints of pain. Will continue to monitor.  Clyde Canterbury, RN

## 2017-10-20 ENCOUNTER — Other Ambulatory Visit: Payer: Self-pay | Admitting: Physician Assistant

## 2017-10-20 ENCOUNTER — Inpatient Hospital Stay (HOSPITAL_COMMUNITY): Payer: Medicare Other

## 2017-10-20 ENCOUNTER — Ambulatory Visit: Payer: Medicare Other | Admitting: Physical Therapy

## 2017-10-20 ENCOUNTER — Encounter (HOSPITAL_COMMUNITY): Payer: Self-pay | Admitting: Cardiovascular Disease

## 2017-10-20 ENCOUNTER — Encounter: Payer: Medicare Other | Admitting: Surgery

## 2017-10-20 DIAGNOSIS — I350 Nonrheumatic aortic (valve) stenosis: Secondary | ICD-10-CM

## 2017-10-20 DIAGNOSIS — Z954 Presence of other heart-valve replacement: Secondary | ICD-10-CM

## 2017-10-20 DIAGNOSIS — Z952 Presence of prosthetic heart valve: Secondary | ICD-10-CM

## 2017-10-20 LAB — BASIC METABOLIC PANEL
ANION GAP: 9 (ref 5–15)
BUN: 10 mg/dL (ref 8–23)
CALCIUM: 8.5 mg/dL — AB (ref 8.9–10.3)
CO2: 26 mmol/L (ref 22–32)
CREATININE: 0.73 mg/dL (ref 0.44–1.00)
Chloride: 104 mmol/L (ref 98–111)
GFR calc non Af Amer: >60 mL/min (ref >60)
Glucose, Bld: 100 mg/dL — ABNORMAL HIGH (ref 70–99)
Potassium: 4.2 mmol/L (ref 3.5–5.1)
SODIUM: 139 mmol/L (ref 135–145)

## 2017-10-20 LAB — CBC
HEMATOCRIT: 35 % — AB (ref 36.0–46.0)
Hemoglobin: 11.1 g/dL — ABNORMAL LOW (ref 12.0–15.0)
MCH: 29.8 pg (ref 26.0–34.0)
MCHC: 31.7 g/dL (ref 30.0–36.0)
MCV: 94.1 fL (ref 78.0–100.0)
Platelets: 138 10*3/uL — ABNORMAL LOW (ref 150–400)
RBC: 3.72 MIL/uL — ABNORMAL LOW (ref 3.87–5.11)
RDW: 12.6 % (ref 11.5–15.5)
WBC: 8.2 10*3/uL (ref 4.0–10.5)

## 2017-10-20 LAB — ECHOCARDIOGRAM LIMITED
HEIGHTINCHES: 62 in
WEIGHTICAEL: 2564.39 [oz_av]

## 2017-10-20 LAB — MAGNESIUM: MAGNESIUM: 2.2 mg/dL (ref 1.7–2.4)

## 2017-10-20 MED ORDER — METOPROLOL TARTRATE 50 MG PO TABS
50.0000 mg | ORAL_TABLET | Freq: Two times a day (BID) | ORAL | Status: DC
  Administered 2017-10-20 – 2017-10-21 (×3): 50 mg via ORAL
  Filled 2017-10-20 (×3): qty 1

## 2017-10-20 MED ORDER — POLYETHYLENE GLYCOL 3350 17 G PO PACK
17.0000 g | PACK | Freq: Every day | ORAL | Status: DC
  Administered 2017-10-20 – 2017-10-21 (×2): 17 g via ORAL
  Filled 2017-10-20 (×2): qty 1

## 2017-10-20 MED ORDER — IBUPROFEN 400 MG PO TABS
400.0000 mg | ORAL_TABLET | Freq: Once | ORAL | Status: AC
  Administered 2017-10-20: 400 mg via ORAL
  Filled 2017-10-20: qty 1

## 2017-10-20 MED FILL — Potassium Chloride Inj 2 mEq/ML: INTRAVENOUS | Qty: 40 | Status: AC

## 2017-10-20 MED FILL — Heparin Sodium (Porcine) Inj 1000 Unit/ML: INTRAMUSCULAR | Qty: 30 | Status: AC

## 2017-10-20 MED FILL — Magnesium Sulfate Inj 50%: INTRAMUSCULAR | Qty: 10 | Status: AC

## 2017-10-20 NOTE — Progress Notes (Signed)
  Echocardiogram 2D Echocardiogram has been performed.  Jennette Dubin 10/20/2017, 8:53 AM

## 2017-10-20 NOTE — Progress Notes (Addendum)
[facility] TEAM  Patient Name: [name] Date of Encounter: 10/20/2017  Primary Cardiologist: Dr. Johnsie Cancel / Dr. Burt Knack & Dr. Roxy Manns (TAVR)  Hospital Problem List     Principal Problem:   S/P TAVR (transcatheter aortic valve replacement) Active Problems:   Essential hypertension   GERD   Severe aortic stenosis   Mixed hyperlipidemia   Fibromyalgia     Subjective   Feeling well other than a headache. Headache is generalized and involves her entire head. She was given some tylenol and tramadol with slight improvement.   Inpatient Medications    Scheduled Meds: . aspirin  81 mg Oral Daily  . clopidogrel  75 mg Oral Q breakfast  . metoprolol tartrate  50 mg Oral BID  . rosuvastatin  5 mg Oral q1800  . sodium chloride flush  3 mL Intravenous Q12H   Continuous Infusions: . sodium chloride    . cefUROXime (ZINACEF)  IV Stopped (10/20/17 0108)  . nitroGLYCERIN    . phenylephrine (NEO-SYNEPHRINE) Adult infusion     PRN Meds: sodium chloride, acetaminophen **OR** acetaminophen, fentaNYL (SUBLIMAZE) injection, metoprolol tartrate, morphine injection, ondansetron (ZOFRAN) IV, ondansetron (ZOFRAN) IV, oxyCODONE **OR** oxyCODONE, oxyCODONE, sodium chloride flush, traMADol   Vital Signs    Vitals:   10/20/17 0500 10/20/17 0510 10/20/17 0656 10/20/17 0907  BP:  (!) 143/55  (!) 155/58  Pulse:  67 82 81  Resp:  15 17 16   Temp:  98.4 F (36.9 C)  98.2 F (36.8 C)  TempSrc:  Oral  Oral  SpO2:  93% 95% 98%  Weight: 160 lb 4.4 oz (72.7 kg)     Height:        Intake/Output Summary (Last 24 hours) at 10/20/2017 0945 Last data filed at 10/20/2017 0500 Gross per 24 hour  Intake 1708.23 ml  Output 950 ml  Net 758.23 ml   Filed Weights   10/19/17 0600 10/20/17 0500  Weight: 158 lb (71.7 kg) 160 lb 4.4 oz (72.7 kg)    Physical Exam   GEN: Well nourished, well developed, in no acute distress.  HEENT: Grossly normal.  Neck:  Supple, no JVD, carotid bruits, or masses. Cardiac: RRR, 2/6 SEM @RUSB . No rubs, or gallops. No clubbing, cyanosis, edema.  Radials/DP/PT 2+ and equal bilaterally.  Respiratory:  Respirations regular and unlabored, clear to auscultation bilaterally. GI: Soft, nontender, nondistended, BS + x 4. MS: no deformity or atrophy. Skin: warm and dry, no rash. Groin sites soft, non tender with no hematoma Neuro:  Strength and sensation are intact. Psych: AAOx3.  Normal affect.  Labs    CBC Recent Labs    10/19/17 1006 10/20/17 0346  WBC  --  8.2  HGB 11.2* 11.1*  HCT 33.0* 35.0*  MCV  --  94.1  PLT  --  185*   Basic Metabolic Panel Recent Labs    10/19/17 1006 10/20/17 0346  NA 143 139  K 4.3 4.2  CL 106 104  CO2  --  26  GLUCOSE 118* 100*  BUN 7* 10  CREATININE 0.60 0.73  CALCIUM  --  8.5*  MG  --  2.2   Liver Function Tests No results for input(s): AST, ALT, ALKPHOS, BILITOT, PROT, ALBUMIN in the last 72 hours. No results for input(s): LIPASE, AMYLASE in the last 72 hours. Cardiac Enzymes No results for input(s): CKTOTAL, CKMB, CKMBINDEX, TROPONINI in the last 72 hours. BNP Invalid input(s): POCBNP D-Dimer No results for input(s):  DDIMER in the last 72 hours. Hemoglobin A1C No results for input(s): HGBA1C in the last 72 hours. Fasting Lipid Panel No results for input(s): CHOL, HDL, LDLCALC, TRIG, CHOLHDL, LDLDIRECT in the last 72 hours. Thyroid Function Tests No results for input(s): TSH, T4TOTAL, T3FREE, THYROIDAB in the last 72 hours.  Invalid input(s): FREET3  Telemetry    Sinus  - Personally Reviewed  ECG    NSR, HR 73 - Personally Reviewed  Radiology    Dg Chest Port 1 View  Result Date: 10/19/2017 CLINICAL DATA:  Status post transcatheter aortic valve replacement. Former smoker. EXAM: PORTABLE CHEST 1 VIEW COMPARISON:  Chest x-ray of October 15, 2017 FINDINGS: The lungs are well-expanded and clear. The heart and pulmonary vascularity are normal. The cage of  the prosthetic aortic valve is visible in is in reasonable position radiographically. There is calcification in the wall of the aortic arch. There is no pleural effusion. The right internal jugular venous catheter tip projects over the midportion of the SVC. There is widening of the right AC joint which is chronic. IMPRESSION: There is no active cardiopulmonary disease. Thoracic aortic atherosclerosis. Electronically Signed   By: David  Martinique M.D.   On: 10/19/2017 12:48    Cardiac Studies   TAVR OPERATIVE NOTE   Date of Procedure:                10/19/2017  Preoperative Diagnosis:      Severe Aortic Stenosis  Procedure:        Transcatheter Aortic Valve Replacement - Percutaneous  Transfemoral Approach             Edwards Sapien 3 THV (size 20 mm, model # 9600TFX, serial # [id number])              Co-Surgeons:                        Valentina Gu. Roxy Manns, MD and Sherren Mocha, MD  Pre-operative Echo Findings: ? Severe aortic stenosis ? Normal left ventricular systolic function  Post-operative Echo Findings: ? Mild paravalvular leak ? Normal left ventricular systolic function  ________________   Post operative echo 10/20/17: pending  Patient Profile     THERMA LASURE is a 82 y.o. female with a history of HTN, HLD, fibromyalgia, GERD and severe AS who presented to [facility] on 10/19/17 for planned TAVR.   Assessment & Plan    Severe AS: s/p successful TAVR with a 20 mm Edwards Sapien 3 THV via the TF approach on 10/19/17. Post operative echo pending. Groin sites are stable. ECG with NSR and no high grade heart block. Continue ASA and plavix. Remove central line.   HTN: BP elevated, will resume home Lopressor  Headache: patient still complaining of this. Given tylenol and tramadol. Will try a one time dose of Iburpofen and continue to monitor.    Dispo: patient medially stable for DC today but still having a 1/10 HA and feels more comfortable staying the [place]. Hopeful DC home tomorrow AM  Signed, Angelena Form, PA-C  10/20/2017, 9:45 AM  Pager [phone]  I have seen and examined the patient and agree with the assessment and plan as outlined.  Maintaining NSR.  Routine f/u ECHO looks good with relatively low transvalvular gradient an trivial PVL.  Tentatively plan d/c home tomorrow.  Rexene Alberts, MD 10/20/2017

## 2017-10-20 NOTE — Progress Notes (Signed)
CARDIAC REHAB PHASE I   PRE:  Rate/Rhythm: 77 SR    BP: lying 155/58    SaO2: 98 RA  MODE:  Ambulation: 470 ft   POST:  Rate/Rhythm: 101 ST    BP: sitting 158/51     SaO2: 98 RA  Pt c/o HA but willing to walk. She walked independently at quick pace. No c/o, feels well. This was her second walk. Discussed restrictions, walking daily, and CRPII. She lives far away from any CRPII so she declined. [facility], ACSM 10/20/2017 9:31 AM

## 2017-10-20 NOTE — Progress Notes (Signed)
Patient education completed for removal of central line,patient verbalized understanding, central line removed as ordered, patient now on bedrest will monitor.

## 2017-10-21 LAB — BASIC METABOLIC PANEL
Anion gap: 11 (ref 5–15)
BUN: 9 mg/dL (ref 8–23)
CALCIUM: 8.5 mg/dL — AB (ref 8.9–10.3)
CHLORIDE: 104 mmol/L (ref 98–111)
CO2: 26 mmol/L (ref 22–32)
CREATININE: 0.74 mg/dL (ref 0.44–1.00)
GFR calc Af Amer: >60 mL/min (ref >60)
Glucose, Bld: 85 mg/dL (ref 70–99)
Potassium: 4.3 mmol/L (ref 3.5–5.1)
SODIUM: 141 mmol/L (ref 135–145)

## 2017-10-21 LAB — CBC
HCT: 35.2 % — ABNORMAL LOW (ref 36.0–46.0)
HEMOGLOBIN: 11.2 g/dL — AB (ref 12.0–15.0)
MCH: 30.4 pg (ref 26.0–34.0)
MCHC: 31.8 g/dL (ref 30.0–36.0)
MCV: 95.7 fL (ref 78.0–100.0)
Platelets: 121 10*3/uL — ABNORMAL LOW (ref 150–400)
RBC: 3.68 MIL/uL — AB (ref 3.87–5.11)
RDW: 12.7 % (ref 11.5–15.5)
WBC: 7.1 10*3/uL (ref 4.0–10.5)

## 2017-10-21 MED ORDER — CLOPIDOGREL BISULFATE 75 MG PO TABS: 75.0000 mg | ORAL_TABLET | Freq: Every day | ORAL | 1 refills | Status: DC

## 2017-10-21 NOTE — Discharge Instructions (Signed)
ACTIVITY AND EXERCISE °• Daily activity and exercise are an important part of your recovery. People recover at different rates depending on their general health and type of valve procedure. °• Most people recovering from TAVR feel better relatively quickly  °• No lifting, pushing, pulling more than 10 pounds (examples to avoid: groceries, vacuuming, gardening, golfing): °            - For one week with a procedure through the groin. °            - For six weeks for procedures through the chest wall. °            - For three months for procedures through the breast-bone. °NOTE: You will typically see one of our providers 7-10 days after your procedure to discuss WHEN TO RESUME the above activities.  °  °  °DRIVING °• Do not drive for until you are seen for follow up and cleared by a provider. °• If you have been told by your doctor in the past that you may not drive, you must talk with him/her before you begin driving again. °  °  °DRESSING °• Groin site: you may leave the clear dressing over the site for up to one week or until it falls off. °  °  °HYGIENE °• If you had a femoral (leg) procedure, you may take a shower when you return home. After the shower, pat the site dry. Do NOT use powder, oils or lotions in your groin area until the site has completely healed. °• If you had a chest procedure, you may shower when you return home unless specifically instructed not to by your discharging practitioner. °            - DO NOT scrub incision; pat dry with a towel °            - DO NOT apply any lotions, oils, powders to the incision °            - No tub baths / swimming for at least 2 weeks. °• If you notice any fevers, chills, increased pain, swelling, bleeding or pus, please contact your doctor. °  °ADDITIONAL INFORMATION °• If you are going to have an upcoming dental procedure, please contact our office as you will require antibiotics ahead of time to prevent infection on your heart valve.  ° ° ° ° ° °After TAVR  Checklist ° °Check  Test Description  ° Follow up appointment in 1-2 weeks  You will see our structural heart physician assistant, Katie Acelyn Basham. Your incision sites will be checked and you will be cleared to drive and resume all normal activities if you are doing well.    ° 1 month echo and follow up  You will have an echo to check on your new heart valve and be seen back in the office by Katie Tomie Elko. Many times the echo is not read by your appointment time, but Katie will call you later that day or the following day to report your results.  ° Follow up with your primary cardiologist You will need to be seen by your primary cardiologist in the following 3-6 months after your 1 month appointment in the valve clinic. Often times your Plavix or Aspirin will be discontinued during this time, but this is decided on a case by case basis.   ° 1 year echo and follow up You will have another echo to check on your heart valve   after 1 year and be seen back in the office by Katie Maelee Hoot. This your last structural heart visit.  ° Bacterial endocarditis prophylaxis  You will have to take antibiotics for the rest of your life before all dental procedures (even teeth cleanings) to protect your heart valve. Antibiotics are also required before some surgeries. Please check with your cardiologist before scheduling any surgeries. Also, please make sure to tell us if you have a penicillin allergy as you will require an alternative antibiotic.   ° ° °

## 2017-10-21 NOTE — Progress Notes (Signed)
RN went to room to d/c pt. Pt complaining of "belt around her neck" and a fluttering sensation in her chest. Pt placed back on telemetry. VSS. EKG obtained showing NSR. Angelena Form PA-C paged and spoke to pt.  Pt okay with leaving. IV removed, clean and intact. Telemetry removed. D/c instructions and wound care reviewed. All questions answered. Husband to escort pt home.  Clyde Canterbury, RN

## 2017-10-21 NOTE — Discharge Summary (Addendum)
[place] VALVE TEAM   Discharge Summary    Patient ID: [name],  MRN: [id number], DOB/AGE: 1936/03/29 82 y.o.  Admit date: 10/19/2017 Discharge date: 10/21/2017  Primary Care Provider: [name] Primary Cardiologist: Dr. Johnsie Cancel / Dr. Burt Knack & Dr. Roxy Manns (TAVR)   Discharge Diagnoses    Principal Problem:   S/P TAVR (transcatheter aortic valve replacement) Active Problems:   Essential hypertension   GERD   Severe aortic stenosis   Mixed hyperlipidemia   Fibromyalgia   Allergies Allergies  Allergen Reactions  . Tape Other (See Comments)    Dermatitis rash "with extended exposure"  . Prolia [Denosumab] Other (See Comments)    Arthralgia/ myalgia/ jaw pain/ headache     History of Present Illness     [name] is a 82 y.o. female with a history of HTN, HLD, fibromyalgia, GERD and severe AS who presented to [facility] on 10/19/17 for planned TAVR.  She has a history of aortic stenosis that has been followed with periodic echocardiograms.Her echo in 02/2017 showed moderate to severe aortic stenosis with a mean gradient of 32 mmHg and a dimensionless index of 0.29.She now reports that over the past several months she has had progressive exertional fatigue and shortness of breath. She has always been quite active but recently has had to take frequent rest breaks. She also reports some exertional chest pressure.She underwent a repeat echocardiogram on 09/07/2017 which showed progression of her aortic stenosis with a mean gradient of 40 mmHg and a peak gradient of 74 mmHg. The dimensionless index was 0.26. Left ventricular ejection fraction remained 65 to 70% with grade 1 diastolic dysfunction. She underwent cardiac catheterization by Dr. Burt Knack on 09/29/2017 and this showed widely patent coronary arteries with minor luminal irregularities.   She was evaluated by the multidisciplinary valve team and deemed to be a good candidate for  TAVR, which was set up for 10/19/17.   Hospital Course     Consultants: none  Severe AS:s/p successful TAVR with a 20 mm Edwards Sapien 3 THV via the TF approach on 10/19/17. Post operative echo showed EF 65%, normally functioning TAVR with mean gradient 12 mmHg and trivial PVL. Groin sites are stable. ECG with NSR and no high grade heart block. Continue ASA and plavix. I have asked her to discontinue her CBD oil given potential interaction with Plavix. Discharge home today with 1 week TOC follow up.   HTN: BP improved on home medications  Headache: this has resolved.    The patient has had an uncomplicated hospital course and is recovering well. The femoral catheter sites are stable. She has been seen by myself today and deemed ready for discharge home. All follow-up appointments have been scheduled. Discharge medications are listed below.  _____________  Discharge Vitals Blood pressure (!) 137/52, pulse 66, temperature 98.2 F (36.8 C), temperature source Oral, resp. rate 12, height 5\' 2"  (1.575 m), weight 158 lb 8.2 oz (71.9 kg), SpO2 94 %.  Filed Weights   10/19/17 0600 10/20/17 0500 10/21/17 0508  Weight: 158 lb (71.7 kg) 160 lb 4.4 oz (72.7 kg) 158 lb 8.2 oz (71.9 kg)    VS:  BP (!) 137/52   Pulse 66   Temp 98.2 F (36.8 C) (Oral)   Resp 12   Ht 5\' 2"  (1.575 m)   Wt 158 lb 8.2 oz (71.9 kg)   SpO2 94%   BMI 28.99 kg/m    GEN: Well  nourished, well developed, in no acute distress  HEENT: normal  Neck: no JVD, carotid bruits, or masses Cardiac: RRR; 2/6 SEM. No rubs, or gallops,no edema  Respiratory:  clear to auscultation bilaterally, normal work of breathing GI: soft, nontender, nondistended, + BS MS: no deformity or atrophy  Skin: warm and dry, no rash Neuro:  Alert and Oriented x 3, Strength and sensation are intact Psych: euthymic mood, full affect   Labs & Radiologic Studies     CBC Recent Labs    10/20/17 0346 10/21/17 0329  WBC 8.2 7.1  HGB 11.1* 11.2*   HCT 35.0* 35.2*  MCV 94.1 95.7  PLT 138* 093*   Basic Metabolic Panel Recent Labs    10/20/17 0346 10/21/17 0329  NA 139 141  K 4.2 4.3  CL 104 104  CO2 26 26  GLUCOSE 100* 85  BUN 10 9  CREATININE 0.73 0.74  CALCIUM 8.5* 8.5*  MG 2.2  --    Liver Function Tests No results for input(s): AST, ALT, ALKPHOS, BILITOT, PROT, ALBUMIN in the last 72 hours. No results for input(s): LIPASE, AMYLASE in the last 72 hours. Cardiac Enzymes No results for input(s): CKTOTAL, CKMB, CKMBINDEX, TROPONINI in the last 72 hours. BNP Invalid input(s): POCBNP D-Dimer No results for input(s): DDIMER in the last 72 hours. Hemoglobin A1C No results for input(s): HGBA1C in the last 72 hours. Fasting Lipid Panel No results for input(s): CHOL, HDL, LDLCALC, TRIG, CHOLHDL, LDLDIRECT in the last 72 hours. Thyroid Function Tests No results for input(s): TSH, T4TOTAL, T3FREE, THYROIDAB in the last 72 hours.  Invalid input(s): FREET3  Dg Chest 2 View  Result Date: 10/15/2017 CLINICAL DATA:  Preoperative exam. EXAM: CHEST - 2 VIEW COMPARISON:  July 25, 2010 FINDINGS: The heart size and mediastinal contours are within normal limits. Both lungs are clear. The visualized skeletal structures are unremarkable. IMPRESSION: No active cardiopulmonary disease. Electronically Signed   By: Dorise Bullion III M.D   On: 10/15/2017 09:37   Ct Coronary Morph W/cta Cor Nancy Fetter W/ca W/cm &/or Wo/cm  Addendum Date: 10/11/2017   ADDENDUM REPORT: 10/11/2017 12:56 CLINICAL DATA:  Aortic stenosis EXAM: Cardiac TAVR CT TECHNIQUE: The patient was scanned on a Siemens Force 192 scanner. A 120 kV retrospective scan was triggered in the descending thoracic aorta at 111 HU's. Gantry rotation speed was 270 msecs and collimation was .9 mm. No beta blockade or nitro were given. The 3D data set was reconstructed in 5% intervals of the R-R cycle. Systolic and diastolic phases were analyzed on a dedicated work station using MPR, MIP and  VRT modes. The patient received 80 cc of contrast. FINDINGS: Aortic Valve: Calcified and tri leaflet with small annulus Aorta: Moderate calcific aortic atherosclerosis with normal arch vessel take offs. Sinotubular Junction: 24 mm Ascending Thoracic Aorta: 31 mm Aortic Arch: 25 mm Descending Thoracic Aorta: 21 mm Sinus of Valsalva Measurements: Non-coronary: 27.8 mm Right - coronary: 24.9 mm Left - coronary: 25.2 mm Coronary Artery Height above Annulus: Left Main: 12.2 mm above annulus Right Coronary: 15 mm above annulus Virtual Basal Annulus Measurements: Maximum/Minimum Diameter: 23.5 mm x 17.1 mm Perimeter: 65.2 mm Area: 317 mm2 Coronary Arteries: Coronary arteries sufficient height above annulus for deployment Optimum Fluoroscopic Angle for Delivery: LAO 9 Caudal 17 degrees IMPRESSION: 1. Calcified tri-leaflet AV with small annulus only suitable for a 20 mm Sapien 3 valve 2. Optimum angiographic angle for deployment LAO 9 Caudal 17 degrees 3.  Coronary arteries sufficient height above  annulus for deployment 4.  No LAA thrombus Jenkins Rouge Electronically Signed   By: Jenkins Rouge M.D.   On: 10/11/2017 12:56   Result Date: 10/11/2017 EXAM: OVER-READ INTERPRETATION  CT CHEST The following report is an over-read performed by radiologist Dr. Vinnie Langton of [facility], [place] on 10/11/2017. This over-read does not include interpretation of cardiac or coronary anatomy or pathology. The coronary calcium score/coronary CTA interpretation by the cardiologist is attached. COMPARISON:  None. FINDINGS: Extracardiac findings will be described separately under dictation for contemporaneously obtained CTA chest, abdomen and pelvis. IMPRESSION: Please see separate dictation for contemporaneously obtained CTA chest, abdomen and pelvis dated 10/11/2017 for full description of relevant extracardiac findings. Electronically Signed: By: Vinnie Langton M.D. On: 10/11/2017 11:05   Dg Chest Port 1 View  Result Date:  10/19/2017 CLINICAL DATA:  Status post transcatheter aortic valve replacement. Former smoker. EXAM: PORTABLE CHEST 1 VIEW COMPARISON:  Chest x-ray of October 15, 2017 FINDINGS: The lungs are well-expanded and clear. The heart and pulmonary vascularity are normal. The cage of the prosthetic aortic valve is visible in is in reasonable position radiographically. There is calcification in the wall of the aortic arch. There is no pleural effusion. The right internal jugular venous catheter tip projects over the midportion of the SVC. There is widening of the right AC joint which is chronic. IMPRESSION: There is no active cardiopulmonary disease. Thoracic aortic atherosclerosis. Electronically Signed   By: David  Martinique M.D.   On: 10/19/2017 12:48   Ct Angio Chest Aorta W &/or Wo Contrast  Result Date: 10/11/2017 CLINICAL DATA:  82 year old female with history of severe aortic stenosis. Preprocedural study prior to potential transcatheter aortic valve replacement (TAVR) procedure. EXAM: CT ANGIOGRAPHY CHEST, ABDOMEN AND PELVIS TECHNIQUE: Multidetector CT imaging through the chest, abdomen and pelvis was performed using the standard protocol during bolus administration of intravenous contrast. Multiplanar reconstructed images and MIPs were obtained and reviewed to evaluate the vascular anatomy. CONTRAST:  44mL ISOVUE-370 IOPAMIDOL (ISOVUE-370) INJECTION 76% COMPARISON:  No prior chest CT. CT the abdomen and pelvis 06/11/2015. FINDINGS: CTA CHEST FINDINGS Cardiovascular: Heart size is normal. There is no significant pericardial fluid, thickening or pericardial calcification. Aortic atherosclerosis. No definite coronary artery calcifications. Severe thickening calcification of the aortic valve. Severe calcifications of the mitral annulus. Mediastinum/Lymph Nodes: No pathologically enlarged mediastinal or hilar lymph nodes. Esophagus is unremarkable in appearance. No axillary lymphadenopathy. Lungs/Pleura: 4 mm right lower lobe  nodule (axial image 74 of series 15). No other larger more suspicious appearing pulmonary nodules or masses are noted. No acute consolidative airspace disease. No pleural effusions. Musculoskeletal/Soft Tissues: Orthopedic fixation hardware in the lower cervical spine incompletely imaged. There are no aggressive appearing lytic or blastic lesions noted in the visualized portions of the skeleton. CTA ABDOMEN AND PELVIS FINDINGS Hepatobiliary: No suspicious cystic or solid hepatic lesions. No intra or extrahepatic biliary ductal dilatation. Status post cholecystectomy. Pancreas: No pancreatic mass. No pancreatic ductal dilatation. No pancreatic or peripancreatic fluid or inflammatory changes. Spleen: Unremarkable. Adrenals/Urinary Tract: Numerous subcentimeter low-attenuation lesions in both kidneys, too small to characterize, but statistically likely to represent cysts. 1.3 cm intermediate attenuation (42 HU) lesion at the junction of upper and interpolar region of the left kidney, not characterized but favored to represent a proteinaceous/hemorrhagic cyst. No hydroureteronephrosis. Urinary bladder is normal in appearance. Bilateral adrenal glands are normal in appearance. Stomach/Bowel: Normal appearance of the stomach. No pathologic dilatation of small bowel or colon. Numerous colonic diverticulae are noted, particularly  in the descending colon and sigmoid colon, without surrounding inflammatory changes to suggest an acute diverticulitis at this time. The appendix is not confidently identified and may be surgically absent. Regardless, there are no inflammatory changes noted adjacent to the cecum to suggest the presence of an acute appendicitis at this time. Vascular/Lymphatic: Aortic atherosclerosis, without evidence of aneurysm or dissection in the abdominal or pelvic vasculature. Vascular findings and measurements pertinent to potential TAVR procedure, as detailed below. Celiac axis, superior mesenteric artery  and inferior mesenteric artery are all widely patent without hemodynamically significant stenosis. Single renal arteries bilaterally are patent without hemodynamically significant stenosis. No lymphadenopathy noted in the abdomen or pelvis. Reproductive: Status post hysterectomy.  Ovaries are atrophic. Other: No significant volume of ascites.  No pneumoperitoneum. Musculoskeletal: There are no aggressive appearing lytic or blastic lesions noted in the visualized portions of the skeleton. VASCULAR MEASUREMENTS PERTINENT TO TAVR: AORTA: Minimal Aortic Diameter-9 x 9 mm Severity of Aortic Calcification-severe RIGHT PELVIS: Right Common Iliac Artery - Minimal Diameter-8.7 x 5.5 mm Tortuosity-moderate Calcification-mild Right External Iliac Artery - Minimal Diameter-5.6 x 5.5 mm Tortuosity-mild Calcification-none Right Common Femoral Artery - Minimal Diameter-6.2 x 6.9 mm Tortuosity-mild Calcification-mild LEFT PELVIS: Left Common Iliac Artery - Minimal Diameter-7.0 x 5.4 mm Tortuosity-mild Calcification-mild-to-moderate Left External Iliac Artery - Minimal Diameter-6.2 x 5.7 mm Tortuosity-mild Calcification-none Left Common Femoral Artery - Minimal Diameter-6.1 x 6.8 mm Tortuosity-mild Calcification-mild Review of the MIP images confirms the above findings. IMPRESSION: 1. Aortic atherosclerosis with vascular findings and measurements pertinent to potential TAVR procedure, as detailed above. 2. Severe thickening calcification of the aortic valve, compatible with the reported clinical history of severe aortic stenosis. 3.  There are also severe calcifications of the mitral annulus. 4. 4 mm right lower lobe pulmonary nodule. This is nonspecific, but statistically likely benign. No follow-up needed if patient is low-risk. Non-contrast chest CT can be considered in 12 months if patient is high-risk. This recommendation follows the consensus statement: Guidelines for Management of Incidental Pulmonary Nodules Detected on CT  Images: From the Fleischner Society 2017; Radiology 2017; 284:228-243. 5. Severe colonic diverticulosis without evidence of acute diverticulitis at this time. 6. Additional incidental findings, as above. Aortic Atherosclerosis (ICD10-I70.0). Electronically Signed   By: Vinnie Langton M.D.   On: 10/11/2017 12:42   Ct Angio Abdomen Pelvis  W &/or Wo Contrast  Result Date: 10/11/2017 CLINICAL DATA:  82 year old female with history of severe aortic stenosis. Preprocedural study prior to potential transcatheter aortic valve replacement (TAVR) procedure. EXAM: CT ANGIOGRAPHY CHEST, ABDOMEN AND PELVIS TECHNIQUE: Multidetector CT imaging through the chest, abdomen and pelvis was performed using the standard protocol during bolus administration of intravenous contrast. Multiplanar reconstructed images and MIPs were obtained and reviewed to evaluate the vascular anatomy. CONTRAST:  39mL ISOVUE-370 IOPAMIDOL (ISOVUE-370) INJECTION 76% COMPARISON:  No prior chest CT. CT the abdomen and pelvis 06/11/2015. FINDINGS: CTA CHEST FINDINGS Cardiovascular: Heart size is normal. There is no significant pericardial fluid, thickening or pericardial calcification. Aortic atherosclerosis. No definite coronary artery calcifications. Severe thickening calcification of the aortic valve. Severe calcifications of the mitral annulus. Mediastinum/Lymph Nodes: No pathologically enlarged mediastinal or hilar lymph nodes. Esophagus is unremarkable in appearance. No axillary lymphadenopathy. Lungs/Pleura: 4 mm right lower lobe nodule (axial image 74 of series 15). No other larger more suspicious appearing pulmonary nodules or masses are noted. No acute consolidative airspace disease. No pleural effusions. Musculoskeletal/Soft Tissues: Orthopedic fixation hardware in the lower cervical spine incompletely imaged. There are no aggressive appearing  lytic or blastic lesions noted in the visualized portions of the skeleton. CTA ABDOMEN AND PELVIS  FINDINGS Hepatobiliary: No suspicious cystic or solid hepatic lesions. No intra or extrahepatic biliary ductal dilatation. Status post cholecystectomy. Pancreas: No pancreatic mass. No pancreatic ductal dilatation. No pancreatic or peripancreatic fluid or inflammatory changes. Spleen: Unremarkable. Adrenals/Urinary Tract: Numerous subcentimeter low-attenuation lesions in both kidneys, too small to characterize, but statistically likely to represent cysts. 1.3 cm intermediate attenuation (42 HU) lesion at the junction of upper and interpolar region of the left kidney, not characterized but favored to represent a proteinaceous/hemorrhagic cyst. No hydroureteronephrosis. Urinary bladder is normal in appearance. Bilateral adrenal glands are normal in appearance. Stomach/Bowel: Normal appearance of the stomach. No pathologic dilatation of small bowel or colon. Numerous colonic diverticulae are noted, particularly in the descending colon and sigmoid colon, without surrounding inflammatory changes to suggest an acute diverticulitis at this time. The appendix is not confidently identified and may be surgically absent. Regardless, there are no inflammatory changes noted adjacent to the cecum to suggest the presence of an acute appendicitis at this time. Vascular/Lymphatic: Aortic atherosclerosis, without evidence of aneurysm or dissection in the abdominal or pelvic vasculature. Vascular findings and measurements pertinent to potential TAVR procedure, as detailed below. Celiac axis, superior mesenteric artery and inferior mesenteric artery are all widely patent without hemodynamically significant stenosis. Single renal arteries bilaterally are patent without hemodynamically significant stenosis. No lymphadenopathy noted in the abdomen or pelvis. Reproductive: Status post hysterectomy.  Ovaries are atrophic. Other: No significant volume of ascites.  No pneumoperitoneum. Musculoskeletal: There are no aggressive appearing lytic  or blastic lesions noted in the visualized portions of the skeleton. VASCULAR MEASUREMENTS PERTINENT TO TAVR: AORTA: Minimal Aortic Diameter-9 x 9 mm Severity of Aortic Calcification-severe RIGHT PELVIS: Right Common Iliac Artery - Minimal Diameter-8.7 x 5.5 mm Tortuosity-moderate Calcification-mild Right External Iliac Artery - Minimal Diameter-5.6 x 5.5 mm Tortuosity-mild Calcification-none Right Common Femoral Artery - Minimal Diameter-6.2 x 6.9 mm Tortuosity-mild Calcification-mild LEFT PELVIS: Left Common Iliac Artery - Minimal Diameter-7.0 x 5.4 mm Tortuosity-mild Calcification-mild-to-moderate Left External Iliac Artery - Minimal Diameter-6.2 x 5.7 mm Tortuosity-mild Calcification-none Left Common Femoral Artery - Minimal Diameter-6.1 x 6.8 mm Tortuosity-mild Calcification-mild Review of the MIP images confirms the above findings. IMPRESSION: 1. Aortic atherosclerosis with vascular findings and measurements pertinent to potential TAVR procedure, as detailed above. 2. Severe thickening calcification of the aortic valve, compatible with the reported clinical history of severe aortic stenosis. 3.  There are also severe calcifications of the mitral annulus. 4. 4 mm right lower lobe pulmonary nodule. This is nonspecific, but statistically likely benign. No follow-up needed if patient is low-risk. Non-contrast chest CT can be considered in 12 months if patient is high-risk. This recommendation follows the consensus statement: Guidelines for Management of Incidental Pulmonary Nodules Detected on CT Images: From the Fleischner Society 2017; Radiology 2017; 284:228-243. 5. Severe colonic diverticulosis without evidence of acute diverticulitis at this time. 6. Additional incidental findings, as above. Aortic Atherosclerosis (ICD10-I70.0). Electronically Signed   By: Vinnie Langton M.D.   On: 10/11/2017 12:42     Diagnostic Studies/Procedures    TAVR OPERATIVE NOTE   Date of  Procedure:10/19/2017  Preoperative Diagnosis:Severe Aortic Stenosis  Procedure:   Transcatheter Aortic Valve Replacement - Percutaneous Transfemoral Approach Edwards Sapien 3 THV (size 67mm, model # 9600TFX, serial # [id number])  Co-Surgeons:Clarence H. Roxy Manns, MD and Sherren Mocha, MD  Pre-operative Echo Findings: ? Severe aortic stenosis ? Normalleft ventricular systolic function  Post-operative Echo  Findings: ? Mildparavalvular leak ? Normalleft ventricular systolic function  ________________   Post operative echo 10/20/17 Study Conclusions - Left ventricle: The cavity size was normal. There was mild focal   basal hypertrophy of the septum. Systolic function was vigorous.   The estimated ejection fraction was in the range of 65% to 70%.   Wall motion was normal; there were no regional wall motion   abnormalities. Doppler parameters are consistent with abnormal   left ventricular relaxation (grade 1 diastolic dysfunction). - Aortic valve: A new bioprosthesis was present and functioning   normally. The prosthesis had a normal range of motion. The device   appeared normal, had no rocking motion, and showed no evidence of   dehiscence. There was trivial regurgitation. Mean gradient (S):   12 mm Hg. Valve area (VTI): 1.95 cm^2. Indexed valve area (VTI):   1.08 cm^2/m^2. Valve area (Vmax): 1.67 cm^2. Valve area (Vmean):   1.7 cm^2. - Mitral valve: Calcified annulus. Impressions: - Post-TAVR, the new bioprosthesis appears well seated without   significant stenosis. Very trivial AR. LV EF is normal.    Disposition   Pt is being discharged home today in good condition.  Follow-up Plans & Appointments    Follow-up Information    Eileen Stanford, PA-C. Go on 10/27/2017.   Specialties:  Cardiology, Radiology Why:  @ 2pm Contact information: [address] [place]  [phone]            Discharge Medications     Medication List    STOP taking these medications   CBD OIL     TAKE these medications   acetaminophen 500 MG tablet Commonly known as:  TYLENOL Take 1,000 mg by mouth daily as needed for moderate pain or headache.   alendronate 70 MG tablet Commonly known as:  FOSAMAX Take 1 tablet (70 mg total) by mouth every 7 (seven) days. Take with a full glass of water on an empty stomach.   ALOE VERA PO Take 1 capsule by mouth daily.   aspirin 81 MG tablet Take 81 mg by mouth 2 (two) times daily.   BEE POLLEN PO Take 400 mg by mouth daily.   BLINK TEARS 0.25 % Soln Generic drug:  Polyethylene Glycol 400 Place 1-2 drops into both eyes 3 (three) times daily.   clobetasol cream 0.05 % Commonly known as:  TEMOVATE Apply 1 application topically 2 (two) times daily as needed. What changed:  reasons to take this   clopidogrel 75 MG tablet Commonly known as:  PLAVIX Take 1 tablet (75 mg total) by mouth daily with breakfast. Start taking on:  10/22/2017   CoQ-10 100 MG Caps Take 100 mg by mouth daily.   meloxicam 15 MG tablet Commonly known as:  MOBIC Take 15 mg by mouth daily.   metoprolol tartrate 50 MG tablet Commonly known as:  LOPRESSOR Take 1 tablet (50 mg total) by mouth 2 (two) times daily.   OVER THE COUNTER MEDICATION Take 1 capsule by mouth 2 (two) times daily. Hemp Oil   polyethylene glycol packet Commonly known as:  MIRALAX / GLYCOLAX Take 17 g by mouth daily as needed for moderate constipation.   rosuvastatin 5 MG tablet Commonly known as:  CRESTOR Take 1 tablet (5 mg total) by mouth daily.   TURMERIC PO Take 1 capsule by mouth daily.   UNABLE TO FIND Take 2 mLs by mouth as needed. Med Name: Silver Coloid- for colds and sinus infections-  OTC   vitamin B-12 1000 MCG tablet Commonly known as:  CYANOCOBALAMIN Take 1,000 mcg by mouth daily.   vitamin C 1000 MG tablet Take 1,000 mg by  mouth 2 (two) times daily.         Outstanding Labs/Studies   none  Duration of Discharge Encounter   Greater than 30 minutes including physician time.  Signed, Angelena Form PA-C 10/21/2017, 10:31 AM

## 2017-10-21 NOTE — Care Management Note (Signed)
Case Management Note Marvetta Gibbons RN, BSN Unit 4E-Case Manager [phone]  Patient Details  Name: [name] MRN: [id number] Date of Birth: 1936-04-07  Subjective/Objective:     Pt admitted s/p TAVR               Action/Plan: PTA pt lived at home with spouse- plan to return home with spouse- no CM needs noted for transition home.   Expected Discharge Date:  10/21/17               Expected Discharge Plan:  Home/Self Care  In-House Referral:  NA  Discharge planning Services  CM Consult  Post Acute Care Choice:  NA Choice offered to:     DME Arranged:    DME Agency:     HH Arranged:    HH Agency:     Status of Service:  Completed, signed off  If discussed at [place] of Stay Meetings, dates discussed:    Discharge Disposition: home/self care   Additional Comments:  Dawayne Patricia, RN 10/21/2017, 12:21 PM

## 2017-10-21 NOTE — Consult Note (Signed)
[facility] CM Primary Care Navigator  10/21/2017  QUINTESSA SIMMERMAN [birth year] [phone]   Met withpatient and husband Shanon Brow) at the bedside toidentify possible discharge needs. Patientreports"getting tired easily, shortness of breath even with talking and some chest pains. (severe aortic stenosis status post transcatheter aortic valve replacement)  Patientendorses Drema Pry, DO asherprimary care provider.   [place] difficulty.  Patientreports thathusbandhas beenmanagingher medications at home and making sure that she takes it.  Patient verbalizedthat she was driving prior to hospitalization  buthusband has been providing transportation to herdoctors' appointments.  Patientstates that husband will betheprimary caregiverat home and daughter [place]on- who lives nearby) will be able to provide assistance for them when needed.  Anticipated plan for dischargeis home per patient.  Patientand husband voiced understandingto callprimarycare provider'soffice whenshereturnshome,for a post discharge follow-upvisitwithin1- 2 weeksor sooner if needs arise.Patient letter (with PCP's contact number) was provided asareminder.   Discussed with patientregarding [facility] CM services available for health managementandresourcesat homebutshe deniesany needs or issuesat this time.Patienthad verbalizedunderstandingto seekreferral from primary care provider to [facility] care management ifdeemed necessary and appropriatefor anyservicesin thefuture.  [facility] care management information was provided for futureneedsthat shemay have.  Patienthowever,verbally agreedand optedforEMMIcalls tofollow-up withhisrecoveryat home.   Referral made for [facility] General calls after discharge.   For additional questions please contact:  Edwena Felty A. Vinh Sachs, BSN, RN-BC [facility] PRIMARY  CARE Navigator Cell: [phone]

## 2017-10-22 ENCOUNTER — Telehealth: Payer: Self-pay | Admitting: Physician Assistant

## 2017-10-22 NOTE — Telephone Encounter (Signed)
  [place] VALVE TEAM   Patient contacted regarding discharge from [facility] on 10/21/17  Patient understands to follow up with provider Nell Range on 7/17 @2pm  at [place].  Patient understands discharge instructions? yes Patient understands medications and regiment? yes Patient understands to bring all medications to this visit? yes  Angelena Form PA-C  [facility]

## 2017-10-26 NOTE — Progress Notes (Addendum)
[facility] [place]                                       Cardiology Office Note    Date:  10/27/2017   ID:  [name], DOB 1935/12/02, MRN [id number]  PCP:  [name], DO  Cardiologist:  Dr. Johnsie Cancel / Dr. Burt Knack & Dr. Roxy Manns (TAVR)  CC: TOC s/p TAVR  History of Present Illness:  [name] is a 82 y.o. female with a history of HTN, HLD, fibromyalgia, GERD and severe AS s/p TAVR (10/19/17) who presents to clinic for follow up.   She has a history of aortic stenosis that has been followed with periodic echocardiograms.Her echo in 02/2017 showed moderate to severe aortic stenosis with a mean gradient of 32 mmHg and a dimensionless index of 0.29.She now reports that over the past several months she has had progressive exertional fatigue and shortness of breath. She has always been quite active but recently has had to take frequent rest breaks. She also reports some exertional chest pressure.She underwent a repeat echocardiogram on 09/07/2017 which showed progression of her aortic stenosis with a mean gradient of 40 mmHg and a peak gradient of 74 mmHg. The dimensionless index was 0.26. Left ventricular ejection fraction remained 65 to 70% with grade 1 diastolic dysfunction. She underwent cardiac catheterization by Dr. Burt Knack on 09/29/2017 and this showed widely patent coronary arteries with minor luminal irregularities.   She underwent successful TAVR with a58mm Edwards Sapien 3 THV via the TF approach on 10/19/17. Post operative echo showed EF 65%, normally functioning TAVR with mean gradient 12 mmHg and trivial PVL. She was discharged on ASA and plavix.   Today she presents to clinic for follow up. No CP or SOB. She has chronic LE edema in left left leg that is unchanged. No orthopnea or PND. She has an occasional non productive cough. No dizziness or syncope. No blood in stool or urine. No palpitations. She has been walking around about 5  minutes at a time and doing well. She feels like she can talk better without getting as short of breath.     Past Medical History:  Diagnosis Date  . Chronic headaches   . Fibromyalgia   . GERD (gastroesophageal reflux disease)   . Hemorrhoids    INTERNAL--  POST BANDING 02/ 2015  . History of kidney stones   . Hyperlipidemia   . Hypertension   . Moderate aortic stenosis   . OSA (obstructive sleep apnea) CPAP INTOLERANT   . Osteoporosis   . S/P TAVR (transcatheter aortic valve replacement) 10/19/2017   20 mm Edwards Sapien 3 transcatheter heart valve placed via percutaneous right transfemoral approach   . Severe aortic stenosis   . Spinal stenosis, multilevel     Past Surgical History:  Procedure Laterality Date  . ANTERIOR CERVICAL DECOMP/DISCECTOMY FUSION  11-11-1999   C5 - C6  . APPENDECTOMY  AGE 25  . CARDIAC CATHETERIZATION  2008  DR [facility]   ESSENTIALLY NORMAL  . CATARACT EXTRACTION W/ INTRAOCULAR LENS  IMPLANT, BILATERAL  2012  . FLEXIBLE SIGMOIDOSCOPY N/A 06/01/2013   Procedure: FLEXIBLE SIGMOIDOSCOPY;  Surgeon: Inda Castle, MD;  Location: [facility];  Service: Endoscopy;  Laterality: N/A;  may need hemorrhoidal banding  . HEMORRHOIDECTOMY WITH HEMORRHOID BANDING  08-07-2010  . LAPAROSCOPIC CHOLECYSTECTOMY  1990  .  RIGHT/LEFT HEART CATH AND CORONARY ANGIOGRAPHY N/A 09/29/2017   Procedure: RIGHT/LEFT HEART CATH AND CORONARY ANGIOGRAPHY;  Surgeon: Sherren Mocha, MD;  Location: [place] CV LAB;  Service: Cardiovascular;  Laterality: N/A;  . SHOULDER ARTHROSCOPY WITH OPEN ROTATOR CUFF REPAIR AND DISTAL CLAVICLE ACROMINECTOMY Right 05/25/2012   Procedure: SHOULDER ARTHROSCOPY WITH OPEN ROTATOR CUFF REPAIR AND DISTAL CLAVICLE ACROMINECTOMY;  Surgeon: Magnus Sinning, MD;  Location: [place];  Service: Orthopedics;  Laterality: Right;  RIGHT SHOULDER ARTHROSCOPY WITH DERBRIDEMENTOF LABRAL/BICEP TENDON, OPEN DISTAL CLAVICLE RESECTION, ANTERIOR  ACROMINECTOMY ROTATOR CUFF REPAIR ANESTHESIA: GENERAL/SCALENE NERVE BLOCK  . TEE WITHOUT CARDIOVERSION Bilateral 10/19/2017   Procedure: TRANSESOPHAGEAL ECHOCARDIOGRAM (TEE);  Surgeon: Sherren Mocha, MD;  Location: [place];  Service: Open Heart Surgery;  Laterality: Bilateral;  . TENOSYNOVECTOMY Left 01/04/2014   Procedure: LEFT WRIST EXTENSOR TENOSYNOVECTOMY;  Surgeon: Linna Hoff, MD;  Location: [facility];  Service: Orthopedics;  Laterality: Left;  . THYROIDECTOMY  AGE 51   GOITER  . TONSILLECTOMY AND ADENOIDECTOMY  AGE 11  . TRANSCATHETER AORTIC VALVE REPLACEMENT, TRANSFEMORAL  10/19/2017  . TRANSCATHETER AORTIC VALVE REPLACEMENT, TRANSFEMORAL Bilateral 10/19/2017   Procedure: TRANSCATHETER AORTIC VALVE REPLACEMENT, TRANSFEMORAL;  Surgeon: Sherren Mocha, MD;  Location: [place];  Service: Open Heart Surgery;  Laterality: Bilateral;  . TRANSTHORACIC ECHOCARDIOGRAM  last one 04-20-2013  DR [facility]    NORMAL LVSF/ EF 60-65%/ MODERATE  AV  STENOSIS WITH NO AR /  MILD LAE  . UMBILICAL HERNIA REPAIR  JUNE 2006  . VAGINAL HYSTERECTOMY  01-31-2001   ANTERIOR & POSTERIOR REPAIR/ TRANSVAGINAL BLADDER SLING    Current Medications: Outpatient Medications Prior to Visit  Medication Sig Dispense Refill  . acetaminophen (TYLENOL) 500 MG tablet Take 1,000 mg by mouth daily as needed for moderate pain or headache.    . alendronate (FOSAMAX) 70 MG tablet Take 1 tablet (70 mg total) by mouth every 7 (seven) days. Take with a full glass of water on an empty stomach. 4 tablet 11  . ALOE VERA PO Take 1 capsule by mouth daily.    Marland Kitchen aspirin 81 MG tablet Take 81 mg by mouth 2 (two) times daily.     Marland Kitchen BEE POLLEN PO Take 400 mg by mouth daily.     . clobetasol cream (TEMOVATE) 8.09 % Apply 1 application topically 2 (two) times daily as needed. (Patient taking differently: Apply 1 application topically 2 (two) times daily as needed (for rash). ) 30 g 3  . clopidogrel (PLAVIX) 75 MG tablet Take 1  tablet (75 mg total) by mouth daily with breakfast. 90 tablet 1  . Coenzyme Q10 (COQ-10) 100 MG CAPS Take 100 mg by mouth daily.    . metoprolol tartrate (LOPRESSOR) 50 MG tablet Take 1 tablet (50 mg total) by mouth 2 (two) times daily. 180 tablet 1  . OVER THE COUNTER MEDICATION Take 1 capsule by mouth 2 (two) times daily. Hemp Oil    . polyethylene glycol (MIRALAX / GLYCOLAX) packet Take 17 g by mouth daily as needed for moderate constipation.    . Polyethylene Glycol 400 (BLINK TEARS) 0.25 % SOLN Place 1-2 drops into both eyes 3 (three) times daily.     . rosuvastatin (CRESTOR) 5 MG tablet Take 1 tablet (5 mg total) by mouth daily. 90 tablet 1  . TURMERIC PO Take 1 capsule by mouth daily.    . vitamin B-12 (CYANOCOBALAMIN) 1000 MCG tablet Take 1,000 mcg by mouth daily.  No facility-administered medications prior to visit.      Allergies:   Tape and Prolia [denosumab]   Social History   Socioeconomic History  . Marital status: Married    Spouse name: Not on file  . Number of children: 3  . Years of education: Not on file  . Highest education level: Not on file  Occupational History  . Not on file  Social Needs  . Financial resource strain: Not on file  . Food insecurity:    Worry: Not on file    Inability: Not on file  . Transportation needs:    Medical: Not on file    Non-medical: Not on file  Tobacco Use  . Smoking status: Former Smoker    Packs/day: 0.25    Years: 5.00    Pack years: 1.25    Types: Cigarettes    Last attempt to quit: 04/14/2003    Years since quitting: 14.5  . Smokeless tobacco: Never Used  Substance and Sexual Activity  . Alcohol use: Yes    Comment: once in a while-social  . Drug use: Never    Comment: hemp oil   . Sexual activity: Not Currently  Lifestyle  . Physical activity:    Days per week: Not on file    Minutes per session: Not on file  . Stress: Not on file  Relationships  . Social connections:    Talks on phone: Not on file     Gets together: Not on file    Attends religious service: Not on file    Active member of club or organization: Not on file    Attends meetings of clubs or organizations: Not on file    Relationship status: Not on file  Other Topics Concern  . Not on file  Social History Narrative  . Not on file     Family History:  The patient's family history includes Cancer (age of onset: 67) in her son; Cirrhosis in her mother; Gallbladder disease in her maternal grandmother; Heart disease in her sister; Other in her brother; Stomach cancer in her paternal grandfather.      ROS:   Please see the history of present illness.    ROS All other systems reviewed and are negative.   PHYSICAL EXAM:   VS:  BP 136/60   Pulse 63   Ht 5\' 2"  (1.575 m)   Wt 157 lb (71.2 kg)   SpO2 98%   BMI 28.72 kg/m    GEN: Well nourished, well developed, in no acute distress  HEENT: normal  Neck: no JVD, carotid bruits, or masses Cardiac: RRR; 2/6 SEM @ RUSB. No rubs, or gallops,no edema  Respiratory: clear to auscultation bilaterally, normal work of breathing GI: soft, nontender, nondistended, + BS MS: no deformity or atrophy  Skin: warm and dry, no rash. Groin sites healing well with no hematoma or ecchymosis Neuro:  Alert and Oriented x 3, Strength and sensation are intact Psych: euthymic mood, full affect   Wt Readings from Last 3 Encounters:  10/27/17 157 lb (71.2 kg)  10/27/17 157 lb (71.2 kg)  10/21/17 158 lb 8.2 oz (71.9 kg)      Studies/Labs Reviewed:   EKG:  EKG is ordered today.  The ekg ordered today demonstrates NSR HR 63  Recent Labs: 05/24/2017: TSH 3.230 10/15/2017: ALT 18; B Natriuretic Peptide 107.7 10/20/2017: Magnesium 2.2 10/21/2017: BUN 9; Creatinine, Ser 0.74; Hemoglobin 11.2; Platelets 121; Potassium 4.3; Sodium 141   Lipid Panel  Component Value Date/Time   CHOL 145 05/25/2017 0844   CHOL 219 (H) 09/07/2012 0935   TRIG 136 05/25/2017 0844   TRIG 180 (H) 02/17/2013 1007    TRIG 326 (H) 09/07/2012 0935   HDL 47 05/25/2017 0844   HDL 51 02/17/2013 1007   HDL 47 09/07/2012 0935   CHOLHDL 3.1 05/25/2017 0844   CHOLHDL 5.2 10/09/2006 0435   VLDL 58 (H) 10/09/2006 0435   LDLCALC 71 05/25/2017 0844   LDLCALC 89 02/17/2013 1007   LDLCALC 107 (H) 09/07/2012 0935    Additional studies/ records that were reviewed today include:  TAVR OPERATIVE NOTE Date of Procedure:10/19/2017  Preoperative Diagnosis:Severe Aortic Stenosis  Procedure:   Transcatheter Aortic Valve Replacement - Percutaneous Transfemoral Approach Edwards Sapien 3 THV (size 3mm, model # 9600TFX, serial # [id number])  Co-Surgeons:Clarence H. Roxy Manns, MD and Sherren Mocha, MD  Pre-operative Echo Findings: ? Severe aortic stenosis ? Normalleft ventricular systolic function  Post-operative Echo Findings: ? Mildparavalvular leak ? Normalleft ventricular systolic function   ________________   Post operative echo 10/20/17 Study Conclusions - Left ventricle: The cavity size was normal. There was mild focal basal hypertrophy of the septum. Systolic function was vigorous. The estimated ejection fraction was in the range of 65% to 70%. Wall motion was normal; there were no regional wall motion abnormalities. Doppler parameters are consistent with abnormal left ventricular relaxation (grade 1 diastolic dysfunction). - Aortic valve: A new bioprosthesis was present and functioning normally. The prosthesis had a normal range of motion. The device appeared normal, had no rocking motion, and showed no evidence of dehiscence. There was trivial regurgitation. Mean gradient (S): 12 mm Hg. Valve area (VTI): 1.95 cm^2. Indexed valve area (VTI): 1.08 cm^2/m^2. Valve area (Vmax): 1.67 cm^2. Valve area (Vmean): 1.7 cm^2. - Mitral valve: Calcified annulus. Impressions: - Post-TAVR, the new bioprosthesis appears  well seated without significant stenosis. Very trivial AR. LV EF is normal.   ASSESSMENT & PLAN:   Severe AS s/p TAVR:doing great after TAVR. Groin sites are stable. ECG with NSR. Continue ASA and plavix. SBE prophylaxis discussed and Amoxil sent into pharmacy.  I will see her back in 1 month for follow up appointment and echo.   HTN: BP well controlled today. No changes  Pulmonary nodule: 4 mm RLL nodule. Non contrast CT recommended in 12 months if high risk. She is listed as a former smoker but she tells me today that she really never smoked more than a few cigarettes socially. I think she is low risk and does not need follow up.     Medication Adjustments/Labs and Tests Ordered: Current medicines are reviewed at length with the patient today.  Concerns regarding medicines are outlined above.  Medication changes, Labs and Tests ordered today are listed in the Patient Instructions below. Patient Instructions  Medication Instructions:   MAKES SURE YOU TAKE  AMOXICILLIN 500 MG TABLETS ( 4 TABLETS ) 1 HOUR  PRIOR TO DENTAL PROCEDURES  THE IMPORTANCE OF THIS IS TO PROTECT YOUR HEART VALVES FROM ANY POSSIBLE BACTERIA THAT MAY ENTER YOUR BLOODSTREAM FROM AN DENTAL PROCEDURE.   If you need a refill on your cardiac medications before your next appointment, please call your pharmacy.  Labwork: NONE ORDERED  TODAY    Testing/Procedures: NONE ORDERED  TODAY    Follow-Up: KEEP APPT AS SCHEDULED   Any Other Special Instructions Will Be Listed Below (If Applicable).  Signed, Angelena Form, PA-C  10/27/2017 3:03 PM    [facility] [place], [place], [place]  [address] Phone: [phone]; Fax: [phone]

## 2017-10-27 ENCOUNTER — Encounter: Payer: Self-pay | Admitting: Family Medicine

## 2017-10-27 ENCOUNTER — Ambulatory Visit: Payer: Medicare Other | Admitting: Physician Assistant

## 2017-10-27 ENCOUNTER — Ambulatory Visit (INDEPENDENT_AMBULATORY_CARE_PROVIDER_SITE_OTHER): Payer: Medicare Other | Admitting: Family Medicine

## 2017-10-27 ENCOUNTER — Encounter: Payer: Self-pay | Admitting: Physician Assistant

## 2017-10-27 VITALS — BP 136/60 | HR 63 | Ht 62.0 in | Wt 157.0 lb

## 2017-10-27 VITALS — BP 151/60 | HR 68 | Temp 98.8°F | Ht 62.0 in | Wt 157.0 lb

## 2017-10-27 DIAGNOSIS — Z952 Presence of prosthetic heart valve: Secondary | ICD-10-CM

## 2017-10-27 DIAGNOSIS — I10 Essential (primary) hypertension: Secondary | ICD-10-CM | POA: Diagnosis not present

## 2017-10-27 DIAGNOSIS — Z09 Encounter for follow-up examination after completed treatment for conditions other than malignant neoplasm: Secondary | ICD-10-CM

## 2017-10-27 DIAGNOSIS — R911 Solitary pulmonary nodule: Secondary | ICD-10-CM | POA: Diagnosis not present

## 2017-10-27 DIAGNOSIS — R928 Other abnormal and inconclusive findings on diagnostic imaging of breast: Secondary | ICD-10-CM

## 2017-10-27 MED ORDER — AMOXICILLIN 500 MG PO CAPS: 2000.0000 mg | ORAL_CAPSULE | ORAL | 0 refills | Status: DC | PRN

## 2017-10-27 MED ORDER — AMOXICILLIN 500 MG PO CAPS: 500.0000 mg | ORAL_CAPSULE | Freq: Four times a day (QID) | ORAL | 0 refills | Status: DC | PRN

## 2017-10-27 NOTE — Progress Notes (Signed)
Subjective: CC: hospital dc f/u PCP: [name], DO HPI:[name] is a 82 y.o. female presenting to clinic today for:  Hospital discharge follow-up Patient status post TAVR.  She was discharged on 10/21/2016.  She has follow-up with her cardiologist this afternoon.  She notes that she has been doing well since discharge from the hospital.  She is working to walk at least twice daily but does note some lower extremity fatigue with prolonged walks.  She is going to discuss with her cardiologist if she can start getting in the pool instead for physical activity.  No chest pain or shortness of breath.  She does have quite a bit of bruising from IV sites but otherwise is doing well.  Currently anticoagulated with Plavix and aspirin.  Abnormal mammogram Patient reports that she was told she had an abnormal mammogram recently.  She notes that she has had abnormalities in the past which required ultrasound.  This was determined to be dense breast tissue.  Unfortunately, she did not remember to bring the disc to the office today.  I have yet to receive the mammogram results but radiology did confirm that she was on the call list for follow-up given abnormal results.  She denies any palpable lumps, skin changes of the breast or nipple discharge.   ROS: Per HPI  Allergies  Allergen Reactions  . Tape Other (See Comments)    Dermatitis rash "with extended exposure"  . Prolia [Denosumab] Other (See Comments)    Arthralgia/ myalgia/ jaw pain/ headache   Past Medical History:  Diagnosis Date  . Chronic headaches   . Fibromyalgia   . GERD (gastroesophageal reflux disease)   . Hemorrhoids    INTERNAL--  POST BANDING 02/ 2015  . History of kidney stones   . Hyperlipidemia   . Hypertension   . Moderate aortic stenosis   . OSA (obstructive sleep apnea) CPAP INTOLERANT   . Osteoporosis   . S/P TAVR (transcatheter aortic valve replacement) 10/19/2017   20 mm Edwards Sapien 3 transcatheter  heart valve placed via percutaneous right transfemoral approach   . Severe aortic stenosis   . Spinal stenosis, multilevel     Current Outpatient Medications:  .  acetaminophen (TYLENOL) 500 MG tablet, Take 1,000 mg by mouth daily as needed for moderate pain or headache., Disp: , Rfl:  .  alendronate (FOSAMAX) 70 MG tablet, Take 1 tablet (70 mg total) by mouth every 7 (seven) days. Take with a full glass of water on an empty stomach., Disp: 4 tablet, Rfl: 11 .  ALOE VERA PO, Take 1 capsule by mouth daily., Disp: , Rfl:  .  aspirin 81 MG tablet, Take 81 mg by mouth 2 (two) times daily. , Disp: , Rfl:  .  BEE POLLEN PO, Take 400 mg by mouth daily. , Disp: , Rfl:  .  clobetasol cream (TEMOVATE) 4.97 %, Apply 1 application topically 2 (two) times daily as needed. (Patient taking differently: Apply 1 application topically 2 (two) times daily as needed (for rash). ), Disp: 30 g, Rfl: 3 .  clopidogrel (PLAVIX) 75 MG tablet, Take 1 tablet (75 mg total) by mouth daily with breakfast., Disp: 90 tablet, Rfl: 1 .  Coenzyme Q10 (COQ-10) 100 MG CAPS, Take 100 mg by mouth daily., Disp: , Rfl:  .  metoprolol tartrate (LOPRESSOR) 50 MG tablet, Take 1 tablet (50 mg total) by mouth 2 (two) times daily., Disp: 180 tablet, Rfl: 1 .  OVER THE COUNTER MEDICATION,  Take 1 capsule by mouth 2 (two) times daily. Hemp Oil, Disp: , Rfl:  .  polyethylene glycol (MIRALAX / GLYCOLAX) packet, Take 17 g by mouth daily as needed for moderate constipation., Disp: , Rfl:  .  Polyethylene Glycol 400 (BLINK TEARS) 0.25 % SOLN, Place 1-2 drops into both eyes 3 (three) times daily. , Disp: , Rfl:  .  rosuvastatin (CRESTOR) 5 MG tablet, Take 1 tablet (5 mg total) by mouth daily., Disp: 90 tablet, Rfl: 1 .  TURMERIC PO, Take 1 capsule by mouth daily., Disp: , Rfl:  .  vitamin B-12 (CYANOCOBALAMIN) 1000 MCG tablet, Take 1,000 mcg by mouth daily., Disp: , Rfl:  Social History   Socioeconomic History  . Marital status: Married    Spouse  name: Not on file  . Number of children: 3  . Years of education: Not on file  . Highest education level: Not on file  Occupational History  . Not on file  Social Needs  . Financial resource strain: Not on file  . Food insecurity:    Worry: Not on file    Inability: Not on file  . Transportation needs:    Medical: Not on file    Non-medical: Not on file  Tobacco Use  . Smoking status: Former Smoker    Packs/day: 0.25    Years: 5.00    Pack years: 1.25    Types: Cigarettes    Last attempt to quit: 04/14/2003    Years since quitting: 14.5  . Smokeless tobacco: Never Used  Substance and Sexual Activity  . Alcohol use: Yes    Comment: once in a while-social  . Drug use: Never    Comment: hemp oil   . Sexual activity: Not Currently  Lifestyle  . Physical activity:    Days per week: Not on file    Minutes per session: Not on file  . Stress: Not on file  Relationships  . Social connections:    Talks on phone: Not on file    Gets together: Not on file    Attends religious service: Not on file    Active member of club or organization: Not on file    Attends meetings of clubs or organizations: Not on file    Relationship status: Not on file  . Intimate partner violence:    Fear of current or ex partner: Not on file    Emotionally abused: Not on file    Physically abused: Not on file    Forced sexual activity: Not on file  Other Topics Concern  . Not on file  Social History Narrative  . Not on file   Family History  Problem Relation Age of Onset  . Cirrhosis Mother        drinking  . Other Brother        duodenal ulcer  . Heart disease Sister   . Cancer Son 71       colon  . Gallbladder disease Maternal Grandmother   . Stomach cancer Paternal Grandfather     Objective: Office vital signs reviewed. BP (!) 151/60   Pulse 68   Temp 98.8 F (37.1 C) (Oral)   Ht 5\' 2"  (1.575 m)   Wt 157 lb (71.2 kg)   BMI 28.72 kg/m   Physical Examination:  General: Awake,  alert, well nourished, No acute distress HEENT: Normal    Neck: No masses palpated. No lymphadenopathy; murmur radiates to carotids; no JVD; bruising along right side of neck  noted. Cardio: regular rate and rhythm, S1S2 heard, blowing systolic murmur appreciated throughout. Pulm: clear to auscultation bilaterally, no wheezes, rhonchi or rales; normal work of breathing on room air Extremities: warm, well perfused, No edema, cyanosis or clubbing; +2 pulses bilaterally Skin: dry; intact; ecchymosis noted b/l UE and right side of neck.  Assessment/ Plan: 82 y.o. female   1. Abnormal mammogram Awaiting formal report.  Patient to bring the disc to office tomorrow to upload into the computer.  Will refer to the breast clinic in [facility] for further evaluation.  She is currently asymptomatic. - Ambulatory referral to Breast Clinic  2. Hospital discharge follow-up Doing well status post discharge for TAVR procedure.  She has follow-up with her cardiologist this afternoon.  Blood pressure somewhat elevated during today's visit.  Will await their assessment prior to changing any of her blood pressure medications.  3. S/P TAVR (transcatheter aortic valve replacement)   Orders Placed This Encounter  Procedures  . Ambulatory referral to Breast Clinic    Referral Priority:   Routine    Referral Type:   Consultation    Referral Reason:   Specialty Services Required    Requested Specialty:   Breast Surgery    Number of Visits Requested:   1   No orders of the defined types were placed in this encounter.    [name], DO [place] [phone]

## 2017-10-27 NOTE — Patient Instructions (Addendum)
Medication Instructions:   MAKES SURE YOU TAKE  AMOXICILLIN 500 MG TABLETS ( 4 TABLETS ) 1 HOUR  PRIOR TO DENTAL PROCEDURES  THE IMPORTANCE OF THIS IS TO PROTECT YOUR HEART VALVES FROM ANY POSSIBLE BACTERIA THAT MAY ENTER YOUR BLOODSTREAM FROM AN DENTAL PROCEDURE.   If you need a refill on your cardiac medications before your next appointment, please call your pharmacy.  Labwork: NONE ORDERED  TODAY    Testing/Procedures: NONE ORDERED  TODAY    Follow-Up: KEEP APPT AS SCHEDULED   Any Other Special Instructions Will Be Listed Below (If Applicable).

## 2017-10-29 ENCOUNTER — Other Ambulatory Visit: Payer: Self-pay | Admitting: Family Medicine

## 2017-10-29 DIAGNOSIS — N6320 Unspecified lump in the left breast, unspecified quadrant: Secondary | ICD-10-CM

## 2017-11-01 ENCOUNTER — Encounter: Payer: Medicare Other | Admitting: Thoracic Surgery (Cardiothoracic Vascular Surgery)

## 2017-11-01 ENCOUNTER — Ambulatory Visit (INDEPENDENT_AMBULATORY_CARE_PROVIDER_SITE_OTHER): Payer: Medicare Other | Admitting: Physician Assistant

## 2017-11-01 ENCOUNTER — Encounter: Payer: Self-pay | Admitting: Physician Assistant

## 2017-11-01 VITALS — BP 124/60 | HR 62 | Temp 97.8°F | Ht 62.0 in | Wt 158.2 lb

## 2017-11-01 DIAGNOSIS — J0101 Acute recurrent maxillary sinusitis: Secondary | ICD-10-CM

## 2017-11-01 NOTE — Patient Instructions (Signed)
Take 1 500 mg 1 tablet 3 times a day until they are finished

## 2017-11-02 NOTE — Progress Notes (Signed)
BP 124/60   Pulse 62   Temp 97.8 F (36.6 C) (Oral)   Ht 5\' 2"  (1.575 m)   Wt 158 lb 3.2 oz (71.8 kg)   BMI 28.94 kg/m    Subjective:    Patient ID: [name], female    DOB: 1935-10-31, 82 y.o.   MRN: [id number]  HPI: [name] is a 82 y.o. female presenting on 11/01/2017 for roof of mouth is red  Patient comes in for a lot of pain in her face and swelling in the refer of her mouth.  She has had tenderness around her eyes and a lot of congestion.  She denies any fever or chills.  She was concerned because she had had some recent dental work.  She does have to use antibiotics because of her valve replacement.  She is concerned about this type of infection.  Past Medical History:  Diagnosis Date  . Chronic headaches   . Fibromyalgia   . GERD (gastroesophageal reflux disease)   . Hemorrhoids    INTERNAL--  POST BANDING 02/ 2015  . History of kidney stones   . Hyperlipidemia   . Hypertension   . Moderate aortic stenosis   . OSA (obstructive sleep apnea) CPAP INTOLERANT   . Osteoporosis   . S/P TAVR (transcatheter aortic valve replacement) 10/19/2017   20 mm Edwards Sapien 3 transcatheter heart valve placed via percutaneous right transfemoral approach   . Severe aortic stenosis   . Spinal stenosis, multilevel    Relevant past medical, surgical, family and social history reviewed and updated as indicated. Interim medical history since our last visit reviewed. Allergies and medications reviewed and updated. DATA REVIEWED: CHART IN EPIC  Family History reviewed for pertinent findings.  Review of Systems  Constitutional: Positive for fatigue. Negative for activity change, appetite change, chills and fever.  HENT: Positive for congestion, postnasal drip, sinus pain and sore throat.   Eyes: Negative.   Respiratory: Negative for cough and wheezing.   Cardiovascular: Negative.  Negative for chest pain, palpitations and leg swelling.  Gastrointestinal: Negative.     Genitourinary: Negative.   Musculoskeletal: Negative.   Skin: Negative.   Neurological: Positive for headaches.    Allergies as of 11/01/2017      Reactions   Tape Other (See Comments)   Dermatitis rash "with extended exposure"   Prolia [denosumab] Other (See Comments)   Arthralgia/ myalgia/ jaw pain/ headache      Medication List        Accurate as of 11/01/17 11:59 PM. Always use your most recent med list.          acetaminophen 500 MG tablet Commonly known as:  TYLENOL Take 1,000 mg by mouth daily as needed for moderate pain or headache.   alendronate 70 MG tablet Commonly known as:  FOSAMAX Take 1 tablet (70 mg total) by mouth every 7 (seven) days. Take with a full glass of water on an empty stomach.   ALOE VERA PO Take 1 capsule by mouth daily.   amoxicillin 500 MG capsule Commonly known as:  AMOXIL Take 4 capsules (2,000 mg total) by mouth as needed (1 HOUR PRIOR TO DENTAL PROCEDURE).   aspirin 81 MG tablet Take 81 mg by mouth 2 (two) times daily.   BEE POLLEN PO Take 400 mg by mouth daily.   BLINK TEARS 0.25 % Soln Generic drug:  Polyethylene Glycol 400 Place 1-2 drops into both eyes 3 (three) times daily.  clobetasol cream 0.05 % Commonly known as:  TEMOVATE Apply 1 application topically 2 (two) times daily as needed.   clopidogrel 75 MG tablet Commonly known as:  PLAVIX Take 1 tablet (75 mg total) by mouth daily with breakfast.   CoQ-10 100 MG Caps Take 100 mg by mouth daily.   metoprolol tartrate 50 MG tablet Commonly known as:  LOPRESSOR Take 1 tablet (50 mg total) by mouth 2 (two) times daily.   OVER THE COUNTER MEDICATION Take 1 capsule by mouth 2 (two) times daily. Hemp Oil   polyethylene glycol packet Commonly known as:  MIRALAX / GLYCOLAX Take 17 g by mouth daily as needed for moderate constipation.   rosuvastatin 5 MG tablet Commonly known as:  CRESTOR Take 1 tablet (5 mg total) by mouth daily.   TURMERIC PO Take 1 capsule by  mouth daily.   vitamin B-12 1000 MCG tablet Commonly known as:  CYANOCOBALAMIN Take 1,000 mcg by mouth daily.          Objective:    BP 124/60   Pulse 62   Temp 97.8 F (36.6 C) (Oral)   Ht 5\' 2"  (1.575 m)   Wt 158 lb 3.2 oz (71.8 kg)   BMI 28.94 kg/m   Allergies  Allergen Reactions  . Tape Other (See Comments)    Dermatitis rash "with extended exposure"  . Prolia [Denosumab] Other (See Comments)    Arthralgia/ myalgia/ jaw pain/ headache    Wt Readings from Last 3 Encounters:  11/01/17 158 lb 3.2 oz (71.8 kg)  10/27/17 157 lb (71.2 kg)  10/27/17 157 lb (71.2 kg)    Physical Exam  Constitutional: She is oriented to person, place, and time. She appears well-developed and well-nourished.  HENT:  Head: Normocephalic and atraumatic.  Right Ear: Tympanic membrane and external ear normal. No middle ear effusion.  Left Ear: Tympanic membrane and external ear normal.  No middle ear effusion.  Nose: Mucosal edema and rhinorrhea present. Right sinus exhibits no maxillary sinus tenderness. Left sinus exhibits no maxillary sinus tenderness.  Mouth/Throat: Uvula is midline. Posterior oropharyngeal erythema present.  Eyes: Pupils are equal, round, and reactive to light. Conjunctivae and EOM are normal. Right eye exhibits no discharge. Left eye exhibits no discharge.  Neck: Normal range of motion.  Cardiovascular: Normal rate, regular rhythm and normal heart sounds.  Pulmonary/Chest: Effort normal and breath sounds normal. No respiratory distress. She has no wheezes.  Abdominal: Soft.  Lymphadenopathy:    She has no cervical adenopathy.  Neurological: She is alert and oriented to person, place, and time.  Skin: Skin is warm and dry.  Psychiatric: She has a normal mood and affect.        Assessment & Plan:   1. Acute recurrent maxillary sinusitis Amoxicillin 500 mg 1 tablet 3 times daily for 10 days.  Patient has a prescription at home.   Continue all other maintenance  medications as listed above.  Follow up plan: No follow-ups on file.  Educational handout given for [place] PA-C [facility] [address]  [place], [place] [address] [phone]   11/02/2017, 2:49 PM

## 2017-11-03 ENCOUNTER — Ambulatory Visit
Admission: RE | Admit: 2017-11-03 | Discharge: 2017-11-03 | Disposition: A | Discharge status: Home / Self Care | Payer: Medicare Other | Source: Ambulatory Visit | Attending: Family Medicine | Admitting: Family Medicine

## 2017-11-03 DIAGNOSIS — N6320 Unspecified lump in the left breast, unspecified quadrant: Secondary | ICD-10-CM

## 2017-11-03 DIAGNOSIS — N6489 Other specified disorders of breast: Secondary | ICD-10-CM | POA: Diagnosis not present

## 2017-11-03 DIAGNOSIS — R922 Inconclusive mammogram: Secondary | ICD-10-CM | POA: Diagnosis not present

## 2017-11-11 ENCOUNTER — Telehealth: Payer: Self-pay | Admitting: Family Medicine

## 2017-11-15 ENCOUNTER — Telehealth: Payer: Self-pay | Admitting: Rheumatology

## 2017-11-15 NOTE — Telephone Encounter (Signed)
Returned patient's call and patient states her right knee is swollen and she is having difficulty walking. I offered patient an appointment tomorrow and she agreed. Patient is scheduled for 11/16/2017 @ 1:00 pm.

## 2017-11-15 NOTE — Telephone Encounter (Signed)
Patient called and left a voicemail stating she has gotten knee injections in the past and is requesting a return call to see if Dr. Estanislado Pandy recommends getting them again.

## 2017-11-16 ENCOUNTER — Telehealth: Payer: Self-pay

## 2017-11-16 ENCOUNTER — Encounter: Payer: Self-pay | Admitting: Thoracic Surgery (Cardiothoracic Vascular Surgery)

## 2017-11-16 ENCOUNTER — Encounter: Payer: Self-pay | Admitting: Physician Assistant

## 2017-11-16 ENCOUNTER — Ambulatory Visit: Payer: Medicare Other | Admitting: Physician Assistant

## 2017-11-16 VITALS — BP 160/55 | HR 70 | Ht 62.0 in | Wt 157.0 lb

## 2017-11-16 DIAGNOSIS — M810 Age-related osteoporosis without current pathological fracture: Secondary | ICD-10-CM

## 2017-11-16 DIAGNOSIS — Z840 Family history of diseases of the skin and subcutaneous tissue: Secondary | ICD-10-CM | POA: Diagnosis not present

## 2017-11-16 DIAGNOSIS — Z862 Personal history of diseases of the blood and blood-forming organs and certain disorders involving the immune mechanism: Secondary | ICD-10-CM

## 2017-11-16 DIAGNOSIS — I10 Essential (primary) hypertension: Secondary | ICD-10-CM

## 2017-11-16 DIAGNOSIS — Z8719 Personal history of other diseases of the digestive system: Secondary | ICD-10-CM

## 2017-11-16 DIAGNOSIS — M797 Fibromyalgia: Secondary | ICD-10-CM

## 2017-11-16 DIAGNOSIS — M170 Bilateral primary osteoarthritis of knee: Secondary | ICD-10-CM

## 2017-11-16 DIAGNOSIS — M19042 Primary osteoarthritis, left hand: Secondary | ICD-10-CM

## 2017-11-16 DIAGNOSIS — M19041 Primary osteoarthritis, right hand: Secondary | ICD-10-CM

## 2017-11-16 DIAGNOSIS — M5030 Other cervical disc degeneration, unspecified cervical region: Secondary | ICD-10-CM | POA: Diagnosis not present

## 2017-11-16 DIAGNOSIS — I350 Nonrheumatic aortic (valve) stenosis: Secondary | ICD-10-CM

## 2017-11-16 DIAGNOSIS — Z87442 Personal history of urinary calculi: Secondary | ICD-10-CM

## 2017-11-16 DIAGNOSIS — R768 Other specified abnormal immunological findings in serum: Secondary | ICD-10-CM | POA: Diagnosis not present

## 2017-11-16 DIAGNOSIS — Z8679 Personal history of other diseases of the circulatory system: Secondary | ICD-10-CM

## 2017-11-16 DIAGNOSIS — K219 Gastro-esophageal reflux disease without esophagitis: Secondary | ICD-10-CM

## 2017-11-16 MED ORDER — DICLOFENAC SODIUM 1 % TD GEL: TRANSDERMAL | 3 refills | Status: DC

## 2017-11-16 NOTE — Telephone Encounter (Signed)
Please apply for euflexxa for right knee, per Lovena Le. Thanks!

## 2017-11-16 NOTE — Progress Notes (Signed)
Office Visit Note  Patient: [name]             Date of Birth: 05-Aug-1935           MRN: [id number]             PCP: [name], DO Referring: [name], DO Visit Date: 11/16/2017 Occupation: @GUAROCC @  Subjective:  Right knee pain  History of Present Illness: [name] is a 82 y.o. female with history of positive Damariz, osteoarthritis, DDD, fibromyalgia, and osteoporosis.  She presents today with severe right knee pain.  She had a x-ray on 07/08/17 that reveal severe OA.  She had a cortisone injection on 08/11/17 that provided temporary relief.  She wanted to move forward with euflexxa injections but has not heard an update.  She has been having increased difficulty walking.  She has been walking with a cane since Sunday.  She has been wearing a compression sleeve on her right knee joint.  She denies using ice.  She says she is having some radiating pain down to her right ankle.  She is also noticed swelling in her right knee.  She had a valve replacement on 10/19/2017 and has been doing a cardio rehab program which has been requiring her to walk more frequently.  She denies any left knee pain at this time.  She states that her fibromyalgia has been well controlled and she has been using hemp oil which is provided significant pain relief.  She denies any neck pain or stiffness.  She denies any other joint pain or joint swelling at this time. She denies any recent rashes.  She denies any sores in her mouth or nose.  She has sicca symptoms.  She continues to have Raynaud's intermittently at times.     Activities of Daily Living:  Patient reports morning stiffness for 0 minute.   Patient Denies nocturnal pain.  Difficulty dressing/grooming: Denies Difficulty climbing stairs: Reports Difficulty getting out of chair: Reports Difficulty using hands for taps, buttons, cutlery, and/or writing: Denies  Review of Systems  Constitutional: Negative for fatigue.  HENT: Positive for  mouth dryness. Negative for mouth sores and nose dryness.   Eyes: Positive for dryness. Negative for pain and visual disturbance.  Respiratory: Negative for cough, hemoptysis, shortness of breath and difficulty breathing.   Cardiovascular: Positive for hypertension and swelling in legs/feet. Negative for chest pain and palpitations.  Gastrointestinal: Positive for constipation. Negative for blood in stool and diarrhea.  Endocrine: Negative for increased urination.  Genitourinary: Negative for painful urination.  Musculoskeletal: Positive for arthralgias, joint pain and muscle tenderness. Negative for joint swelling, myalgias, muscle weakness, morning stiffness and myalgias.  Skin: Negative for color change, pallor, rash, hair loss, nodules/bumps, skin tightness, ulcers and sensitivity to sunlight.  Allergic/Immunologic: Negative for susceptible to infections.  Neurological: Negative for dizziness, headaches and weakness.  Hematological: Negative for swollen glands.  Psychiatric/Behavioral: Positive for sleep disturbance. Negative for depressed mood. The patient is not nervous/anxious.     PMFS History:  Patient Active Problem List   Diagnosis Date Noted  . S/P TAVR (transcatheter aortic valve replacement) 10/19/2017  . Chronic fatigue 08/25/2017  . DDD (degenerative disc disease), cervical 07/08/2017  . Bursitis of left shoulder 07/08/2017  . Fibromyalgia 07/08/2017  . Atherosclerosis of abdominal aorta ([place]) 06/29/2017  . Mixed hyperlipidemia 03/19/2017  . Osteoporosis 04/21/2016  . Internal hemorrhoids with other complication 62/37/6283  . GI bleed 05/30/2013  . NSAID long-term use  05/30/2013  . Severe aortic stenosis 05/29/2013  . DIVERTICULOSIS-COLON 01/13/2010  . CONSTIPATION 01/13/2010  . PERSONAL HISTORY OF COLONIC POLYPS 01/13/2010  . Essential hypertension 08/28/2008  . GERD 08/28/2008  . PALPITATIONS 08/28/2008    Past Medical History:  Diagnosis Date  . Chronic  headaches   . Fibromyalgia   . GERD (gastroesophageal reflux disease)   . Hemorrhoids    INTERNAL--  POST BANDING 02/ 2015  . History of kidney stones   . Hyperlipidemia   . Hypertension   . Moderate aortic stenosis   . OSA (obstructive sleep apnea) CPAP INTOLERANT   . Osteoporosis   . S/P TAVR (transcatheter aortic valve replacement) 10/19/2017   20 mm Edwards Sapien 3 transcatheter heart valve placed via percutaneous right transfemoral approach   . Severe aortic stenosis   . Spinal stenosis, multilevel     Family History  Problem Relation Age of Onset  . Cirrhosis Mother        drinking  . Other Brother        duodenal ulcer  . Heart disease Sister   . Cancer Son 9       colon  . Gallbladder disease Maternal Grandmother   . Stomach cancer Paternal Grandfather    Past Surgical History:  Procedure Laterality Date  . ANTERIOR CERVICAL DECOMP/DISCECTOMY FUSION  11-11-1999   C5 - C6  . APPENDECTOMY  AGE 47  . CARDIAC CATHETERIZATION  2008  [facility]   ESSENTIALLY NORMAL  . CATARACT EXTRACTION W/ INTRAOCULAR LENS  IMPLANT, BILATERAL  2012  . FLEXIBLE SIGMOIDOSCOPY N/A 06/01/2013   Procedure: FLEXIBLE SIGMOIDOSCOPY;  Surgeon: Inda Castle, MD;  Location: [facility];  Service: Endoscopy;  Laterality: N/A;  may need hemorrhoidal banding  . HEMORRHOIDECTOMY WITH HEMORRHOID BANDING  08-07-2010  . LAPAROSCOPIC CHOLECYSTECTOMY  1990  . RIGHT/LEFT HEART CATH AND CORONARY ANGIOGRAPHY N/A 09/29/2017   Procedure: RIGHT/LEFT HEART CATH AND CORONARY ANGIOGRAPHY;  Surgeon: Sherren Mocha, MD;  Location: [facility];  Service: Cardiovascular;  Laterality: N/A;  . SHOULDER ARTHROSCOPY WITH OPEN ROTATOR CUFF REPAIR AND DISTAL CLAVICLE ACROMINECTOMY Right 05/25/2012   Procedure: SHOULDER ARTHROSCOPY WITH OPEN ROTATOR CUFF REPAIR AND DISTAL CLAVICLE ACROMINECTOMY;  Surgeon: Magnus Sinning, MD;  Location: [facility];  Service: Orthopedics;  Laterality: Right;  RIGHT  SHOULDER ARTHROSCOPY WITH DERBRIDEMENTOF LABRAL/BICEP TENDON, OPEN DISTAL CLAVICLE RESECTION, ANTERIOR ACROMINECTOMY ROTATOR CUFF REPAIR ANESTHESIA: GENERAL/SCALENE NERVE BLOCK  . TEE WITHOUT CARDIOVERSION Bilateral 10/19/2017   Procedure: TRANSESOPHAGEAL ECHOCARDIOGRAM (TEE);  Surgeon: Sherren Mocha, MD;  Location: [facility];  Service: Open Heart Surgery;  Laterality: Bilateral;  . TENOSYNOVECTOMY Left 01/04/2014   Procedure: LEFT WRIST EXTENSOR TENOSYNOVECTOMY;  Surgeon: Linna Hoff, MD;  Location: [facility];  Service: Orthopedics;  Laterality: Left;  . THYROIDECTOMY  AGE 60   GOITER  . TONSILLECTOMY AND ADENOIDECTOMY  AGE 49  . TRANSCATHETER AORTIC VALVE REPLACEMENT, TRANSFEMORAL  10/19/2017  . TRANSCATHETER AORTIC VALVE REPLACEMENT, TRANSFEMORAL Bilateral 10/19/2017   Procedure: TRANSCATHETER AORTIC VALVE REPLACEMENT, TRANSFEMORAL;  Surgeon: Sherren Mocha, MD;  Location: [facility];  Service: Open Heart Surgery;  Laterality: Bilateral;  . TRANSTHORACIC ECHOCARDIOGRAM  last one 04-20-2013  DR [facility]    NORMAL LVSF/ EF 60-65%/ MODERATE  AV  STENOSIS WITH NO AR /  MILD LAE  . UMBILICAL HERNIA REPAIR  JUNE 2006  . VAGINAL HYSTERECTOMY  01-31-2001   ANTERIOR & POSTERIOR REPAIR/ TRANSVAGINAL BLADDER SLING   Social History   Social History Narrative  .  Not on file    Objective: Vital Signs: BP (!) 160/55 (BP Location: Left Arm, Patient Position: Sitting, Cuff Size: Normal)   Pulse 70   Ht 5\' 2"  (1.575 m)   Wt 157 lb (71.2 kg)   BMI 28.72 kg/m    Physical Exam  Constitutional: She is oriented to person, place, and time. She appears well-developed and well-nourished.  HENT:  Head: Normocephalic and atraumatic.  Eyes: Conjunctivae and EOM are normal.  Neck: Normal range of motion.  Cardiovascular: Normal rate, regular rhythm and intact distal pulses.  Murmur heard. Pulmonary/Chest: Effort normal and breath sounds normal.  Abdominal: Soft. Bowel sounds are normal.    Lymphadenopathy:    She has no cervical adenopathy.  Neurological: She is alert and oriented to person, place, and time.  Skin: Skin is warm and dry. Capillary refill takes less than 2 seconds.  Psychiatric: She has a normal mood and affect. Her behavior is normal.  Nursing note and vitals reviewed.    Musculoskeletal Exam: C-spine good ROM.  Thoracic and lumbar spine limited ROM.  Shoulder joints, elbow joints, wrist joints, MCPs, PIPs, and DIPs good ROM with no synovitis.  PIP and DIP synovial thickening consistent with osteoarthritis of both hands.  CMC joint synovial thickening.  Hip joints full ROM.  Limited extension of right knee with discomfort.  Left knee full ROM with no discomfort.  Ankle joints, MTPs, PIPs, and DIPs good ROM with no synovitis.  Pedal edema bilaterally.  No tenderness of trochanteric bursa bilaterally.   CDAI Exam: No CDAI exam completed.   Investigation: No additional findings.  Imaging: Dg Chest Port 1 View  Result Date: 10/19/2017 CLINICAL DATA:  Status post transcatheter aortic valve replacement. Former smoker. EXAM: PORTABLE CHEST 1 VIEW COMPARISON:  Chest x-ray of October 15, 2017 FINDINGS: The lungs are well-expanded and clear. The heart and pulmonary vascularity are normal. The cage of the prosthetic aortic valve is visible in is in reasonable position radiographically. There is calcification in the wall of the aortic arch. There is no pleural effusion. The right internal jugular venous catheter tip projects over the midportion of the SVC. There is widening of the right AC joint which is chronic. IMPRESSION: There is no active cardiopulmonary disease. Thoracic aortic atherosclerosis. Electronically Signed   By: David  Martinique M.D.   On: 10/19/2017 12:48   US Breast Ltd Uni Left Inc Axilla  Result Date: 11/03/2017 CLINICAL DATA:  Screening recall for possible mass in the left breast. EXAM: DIGITAL DIAGNOSTIC LEFT MAMMOGRAM WITH CAD AND TOMO ULTRASOUND LEFT BREAST  COMPARISON:  Previous exam(s). ACR Breast Density Category c: The breast tissue is heterogeneously dense, which may obscure small masses. FINDINGS: On the diagnostic images, the mass disperses the spot-compression consistent with superimposed fibroglandular tissue. There is no defined mass. There are no areas of architectural distortion and no suspicious calcifications. Mammographic images were processed with CAD. On physical exam, no mass is palpated in the retroareolar, slightly medial aspect the left breast. Targeted ultrasound is performed, showing heterogeneous fibroglandular tissue with no masses or suspicious lesions. IMPRESSION: Negative exam.  No evidence of breast malignancy RECOMMENDATION: Screening mammogram in one year.(Code:SM-B-01Y) I have discussed the findings and recommendations with the patient. Results were also provided in writing at the conclusion of the visit. If applicable, a reminder letter will be sent to the patient regarding the next appointment. BI-RADS CATEGORY  1: Negative. Electronically Signed   By: Lajean Manes M.D.   On: 11/03/2017 13:58  Mm Diag Breast Tomo Uni Left  Result Date: 11/03/2017 CLINICAL DATA:  Screening recall for possible mass in the left breast. EXAM: DIGITAL DIAGNOSTIC LEFT MAMMOGRAM WITH CAD AND TOMO ULTRASOUND LEFT BREAST COMPARISON:  Previous exam(s). ACR Breast Density Category c: The breast tissue is heterogeneously dense, which may obscure small masses. FINDINGS: On the diagnostic images, the mass disperses the spot-compression consistent with superimposed fibroglandular tissue. There is no defined mass. There are no areas of architectural distortion and no suspicious calcifications. Mammographic images were processed with CAD. On physical exam, no mass is palpated in the retroareolar, slightly medial aspect the left breast. Targeted ultrasound is performed, showing heterogeneous fibroglandular tissue with no masses or suspicious lesions. IMPRESSION:  Negative exam.  No evidence of breast malignancy RECOMMENDATION: Screening mammogram in one year.(Code:SM-B-01Y) I have discussed the findings and recommendations with the patient. Results were also provided in writing at the conclusion of the visit. If applicable, a reminder letter will be sent to the patient regarding the next appointment. BI-RADS CATEGORY  1: Negative. Electronically Signed   By: Lajean Manes M.D.   On: 11/03/2017 13:58    Recent Labs: Lab Results  Component Value Date   WBC 7.1 10/21/2017   HGB 11.2 (L) 10/21/2017   PLT 121 (L) 10/21/2017   NA 141 10/21/2017   K 4.3 10/21/2017   CL 104 10/21/2017   CO2 26 10/21/2017   GLUCOSE 85 10/21/2017   BUN 9 10/21/2017   CREATININE 0.74 10/21/2017   BILITOT 0.7 10/15/2017   ALKPHOS 56 10/15/2017   AST 20 10/15/2017   ALT 18 10/15/2017   PROT 6.7 10/15/2017   ALBUMIN 3.7 10/15/2017   CALCIUM 8.5 (L) 10/21/2017   GFRAA >60 10/21/2017    Speciality Comments: No specialty comments available.  Procedures:  No procedures performed Allergies: Tape and Prolia [denosumab]   Assessment / Plan:     Visit Diagnoses: Positive Rayah (antinuclear antibody) - Repeat Avilyn negative, RNP positive.  She has no clinical features of lupus.    Family history of lupus erythematosus - Daughter  Primary osteoarthritis of both hands: She has PIP and DIP synovial thickening consistent with osteoarthritis of both hands.  CMC synovial thickening bilaterally.  She has complete fist formation bilaterally.  Joint protection and muscle strengthening were discussed.   Primary osteoarthritis of both knees: She has x-rays of bilateral knee joints on 07/08/17. Right knee x-ray revealed severe osteoarthritis and moderate chondromalacia patella.  Left knee moderate OA.  She has been going to a cardio rehab program following a aortic valve replacement on 10/19/17. She has been experiencing more pain since increasing her level of activity.  She had a cortisone  injection on 08/20/17, which provided temporary relief.  She requested another cortisone injection today, but we declined due to the risks of frequent cortisone injections. She would like to move forward with Euflexxa injections for the right knee joint.  We will notify her once it is approved.  She was encouraged to continue to use ice and elevate.  She plans on wearing the compression sleeve and using a cane.  She was given a handout of knee exercises that she can perform at home. Sh was also given a prescription for voltaren gel.  Potential side effects were discussed. She was given a good rx coupon as well.    DDD (degenerative disc disease), cervical: She has good ROM on exam.  She has no discomfort at this time.   Age-related osteoporosis without current pathological  fracture  Fibromyalgia: Her fibromyalgia has been well controlled. She has no generalized hyperalgesia on exam.  She has no generalized muscle aches or tenderness on exam.  She has been using hemp oil which has been helping her pain level.   Other medical conditions are listed as follows:   Gastroesophageal reflux disease without esophagitis  Aortic stenosis, moderate: She has valve replacement on 10/19/17.  She has been participating in a cardio rehab program.    Essential hypertension  History of diverticulosis  History of anemia  History of kidney stones  History of coronary artery disease   Orders: No orders of the defined types were placed in this encounter.  No orders of the defined types were placed in this encounter.     Follow-Up Instructions: Return in about 6 months (around 05/19/2018) for Osteoarthritis, Fibromyalgia, DDD.   Ofilia Neas, PA-C  Note - This record has been created using Dragon software.  Chart creation errors have been sought, but may not always  have been located. Such creation errors do not reflect on  the standard of medical care.

## 2017-11-16 NOTE — Patient Instructions (Signed)

## 2017-11-18 ENCOUNTER — Telehealth (INDEPENDENT_AMBULATORY_CARE_PROVIDER_SITE_OTHER): Payer: Self-pay

## 2017-11-18 NOTE — Telephone Encounter (Signed)
Submitted for VOB, Euflexxa series, right knee.

## 2017-11-18 NOTE — Telephone Encounter (Signed)
Noted  

## 2017-11-22 ENCOUNTER — Telehealth (INDEPENDENT_AMBULATORY_CARE_PROVIDER_SITE_OTHER): Payer: Self-pay

## 2017-11-22 NOTE — Telephone Encounter (Signed)
Submitted for VOB for Synvisc Series due to Euflexxa not being a preferred product through patient's insurance.

## 2017-11-23 ENCOUNTER — Telehealth (INDEPENDENT_AMBULATORY_CARE_PROVIDER_SITE_OTHER): Payer: Self-pay

## 2017-11-23 NOTE — Telephone Encounter (Signed)
Can you please schedule patient appointment with Lovena Le or Dr. Estanislado Pandy?  Patient approved for Synvisc series, right knee. [place] $50.00 Patient will be responsible for 20% OOP No PA required  FYI- Euflexxa is not a preferred product for patient's insurance.

## 2017-11-25 ENCOUNTER — Ambulatory Visit (HOSPITAL_COMMUNITY): Payer: Medicare Other | Attending: Cardiology

## 2017-11-25 ENCOUNTER — Other Ambulatory Visit: Payer: Self-pay

## 2017-11-25 ENCOUNTER — Ambulatory Visit (INDEPENDENT_AMBULATORY_CARE_PROVIDER_SITE_OTHER): Payer: Medicare Other | Admitting: Physician Assistant

## 2017-11-25 VITALS — BP 152/60 | HR 62 | Ht 62.0 in | Wt 156.0 lb

## 2017-11-25 DIAGNOSIS — R911 Solitary pulmonary nodule: Secondary | ICD-10-CM | POA: Diagnosis not present

## 2017-11-25 DIAGNOSIS — Z952 Presence of prosthetic heart valve: Secondary | ICD-10-CM

## 2017-11-25 DIAGNOSIS — I10 Essential (primary) hypertension: Secondary | ICD-10-CM | POA: Diagnosis not present

## 2017-11-25 DIAGNOSIS — Z953 Presence of xenogenic heart valve: Secondary | ICD-10-CM | POA: Insufficient documentation

## 2017-11-25 DIAGNOSIS — I080 Rheumatic disorders of both mitral and aortic valves: Secondary | ICD-10-CM | POA: Diagnosis not present

## 2017-11-25 MED ORDER — ASPIRIN EC 81 MG PO TBEC: 81.0000 mg | DELAYED_RELEASE_TABLET | Freq: Every day | ORAL | 3 refills | Status: DC

## 2017-11-25 NOTE — Progress Notes (Signed)
[facility]                                       Cardiology Office Note    Date:  11/25/2017   ID:  [name], DOB [birth year], MRN [id number]  PCP:  [name], DO  Cardiologist:   Dr. Johnsie Cancel / Dr. Burt Knack & Dr. Roxy Manns (TAVR)  CC: 1 month s/p TAVR   History of Present Illness:  [name] is a 82 y.o. female with a history of HTN, HLD, fibromyalgia, GERD and severe AS s/p TAVR (10/19/17) who presents to clinic for follow up.   She has a history of aortic stenosis that has been followed with periodic echocardiograms.Her echo in 02/2017 showed moderate to severe aortic stenosis with a mean gradient of 32 mmHg and a dimensionless index of 0.29.She now reports that over the past several months she has had progressive exertional fatigue and shortness of breath. She has always been quite active but recently has had to take frequent rest breaks. She also reports some exertional chest pressure.She underwent a repeat echocardiogram on 09/07/2017 which showed progression of her aortic stenosis with a mean gradient of 40 mmHg and a peak gradient of 74 mmHg. The dimensionless index was 0.26. Left ventricular ejection fraction remained 65 to 70% with grade 1 diastolic dysfunction. She underwent cardiac catheterization by Dr. Burt Knack on 09/29/2017 and this showed widely patent coronary arteries with minor luminal irregularities.   She underwent successful TAVR with a31mm Edwards Sapien 3 THV via the TF approach on 10/19/17. Post operative echoshowed EF 65%, normally functioning TAVR with mean gradient 12 mmHg and trivial PVL. She was discharged on ASA and plavix.   Today she presents to clinic for follow up. She is doing quite well. She feels much better since her TAVR. She has been having knee pain which has slowed her down a bit. She sometimes gets chest pain in her left breast that comes and goes that she thinks is related to her  fibromyalgia. No SOB. No LE edema, orthopnea or PND. No dizziness or syncope. No blood in stool or urine. No palpitations.     Past Medical History:  Diagnosis Date  . Chronic headaches   . Fibromyalgia   . GERD (gastroesophageal reflux disease)   . Hemorrhoids    INTERNAL--  POST BANDING 02/ 2015  . History of kidney stones   . Hyperlipidemia   . Hypertension   . Moderate aortic stenosis   . OSA (obstructive sleep apnea) CPAP INTOLERANT   . Osteoporosis   . S/P TAVR (transcatheter aortic valve replacement) 10/19/2017   20 mm Edwards Sapien 3 transcatheter heart valve placed via percutaneous right transfemoral approach   . Severe aortic stenosis   . Spinal stenosis, multilevel     Past Surgical History:  Procedure Laterality Date  . ANTERIOR CERVICAL DECOMP/DISCECTOMY FUSION  11-11-1999   C5 - C6  . APPENDECTOMY  AGE 89  . CARDIAC CATHETERIZATION  2008  DR [facility]   ESSENTIALLY NORMAL  . CATARACT EXTRACTION W/ INTRAOCULAR LENS  IMPLANT, BILATERAL  2012  . FLEXIBLE SIGMOIDOSCOPY N/A 06/01/2013   Procedure: FLEXIBLE SIGMOIDOSCOPY;  Surgeon: Inda Castle, MD;  Location: [facility];  Service: Endoscopy;  Laterality: N/A;  may need hemorrhoidal banding  . HEMORRHOIDECTOMY WITH HEMORRHOID BANDING  08-07-2010  . LAPAROSCOPIC CHOLECYSTECTOMY  1990  . RIGHT/LEFT HEART CATH AND CORONARY ANGIOGRAPHY N/A 09/29/2017   Procedure: RIGHT/LEFT HEART CATH AND CORONARY ANGIOGRAPHY;  Surgeon: Sherren Mocha, MD;  Location: [place] [facility];  Service: Cardiovascular;  Laterality: N/A;  . SHOULDER ARTHROSCOPY WITH OPEN ROTATOR CUFF REPAIR AND DISTAL CLAVICLE ACROMINECTOMY Right 05/25/2012   Procedure: SHOULDER ARTHROSCOPY WITH OPEN ROTATOR CUFF REPAIR AND DISTAL CLAVICLE ACROMINECTOMY;  Surgeon: Magnus Sinning, MD;  Location: [place];  Service: Orthopedics;  Laterality: Right;  RIGHT SHOULDER ARTHROSCOPY WITH DERBRIDEMENTOF LABRAL/BICEP TENDON, OPEN DISTAL CLAVICLE  RESECTION, ANTERIOR ACROMINECTOMY ROTATOR CUFF REPAIR ANESTHESIA: GENERAL/SCALENE NERVE BLOCK  . TEE WITHOUT CARDIOVERSION Bilateral 10/19/2017   Procedure: TRANSESOPHAGEAL ECHOCARDIOGRAM (TEE);  Surgeon: Sherren Mocha, MD;  Location: [place];  Service: Open Heart Surgery;  Laterality: Bilateral;  . TENOSYNOVECTOMY Left 01/04/2014   Procedure: LEFT WRIST EXTENSOR TENOSYNOVECTOMY;  Surgeon: Linna Hoff, MD;  Location: [facility];  Service: Orthopedics;  Laterality: Left;  . THYROIDECTOMY  AGE 77   GOITER  . TONSILLECTOMY AND ADENOIDECTOMY  AGE 27  . TRANSCATHETER AORTIC VALVE REPLACEMENT, TRANSFEMORAL  10/19/2017  . TRANSCATHETER AORTIC VALVE REPLACEMENT, TRANSFEMORAL Bilateral 10/19/2017   Procedure: TRANSCATHETER AORTIC VALVE REPLACEMENT, TRANSFEMORAL;  Surgeon: Sherren Mocha, MD;  Location: [place];  Service: Open Heart Surgery;  Laterality: Bilateral;  . TRANSTHORACIC ECHOCARDIOGRAM  last one 04-20-2013  DR [facility]    NORMAL LVSF/ EF 60-65%/ MODERATE  AV  STENOSIS WITH NO AR /  MILD LAE  . UMBILICAL HERNIA REPAIR  JUNE 2006  . VAGINAL HYSTERECTOMY  01-31-2001   ANTERIOR & POSTERIOR REPAIR/ TRANSVAGINAL BLADDER SLING    Current Medications: Outpatient Medications Prior to Visit  Medication Sig Dispense Refill  . acetaminophen (TYLENOL) 500 MG tablet Take 1,000 mg by mouth daily as needed for moderate pain or headache.    . ALOE VERA PO Take 1 capsule by mouth daily.    Marland Kitchen BEE POLLEN PO Take 400 mg by mouth daily.     . clobetasol cream (TEMOVATE) 0.99 % Apply 1 application topically 2 (two) times daily as needed. (Patient taking differently: Apply 1 application topically 2 (two) times daily as needed (for rash). ) 30 g 3  . clopidogrel (PLAVIX) 75 MG tablet Take 1 tablet (75 mg total) by mouth daily with breakfast. 90 tablet 1  . Coenzyme Q10 (COQ-10) 100 MG CAPS Take 100 mg by mouth daily.    . diclofenac sodium (VOLTAREN) 1 % GEL Apply 3 grams to 3 large joints up to 3  times daily. 3 Tube 3  . metoprolol tartrate (LOPRESSOR) 50 MG tablet Take 1 tablet (50 mg total) by mouth 2 (two) times daily. 180 tablet 1  . OVER THE COUNTER MEDICATION Take 1 capsule by mouth 2 (two) times daily. Hemp Oil    . polyethylene glycol (MIRALAX / GLYCOLAX) packet Take 17 g by mouth daily as needed for moderate constipation.    . Polyethylene Glycol 400 (BLINK TEARS) 0.25 % SOLN Place 1-2 drops into both eyes 3 (three) times daily.     . rosuvastatin (CRESTOR) 5 MG tablet Take 1 tablet (5 mg total) by mouth daily. 90 tablet 1  . TURMERIC PO Take 1 capsule by mouth daily.    . vitamin B-12 (CYANOCOBALAMIN) 1000 MCG tablet Take 1,000 mcg by mouth daily.    Marland Kitchen aspirin 81 MG tablet Take 81 mg by mouth 2 (two) times daily.      No facility-administered medications prior to visit.  Allergies:   Tape and Prolia [denosumab]   Social History   Socioeconomic History  . Marital status: Married    Spouse name: Not on file  . Number of children: 3  . Years of education: Not on file  . Highest education level: Not on file  Occupational History  . Not on file  Social Needs  . Financial resource strain: Not on file  . Food insecurity:    Worry: Not on file    Inability: Not on file  . Transportation needs:    Medical: Not on file    Non-medical: Not on file  Tobacco Use  . Smoking status: Former Smoker    Packs/day: 0.25    Years: 5.00    Pack years: 1.25    Types: Cigarettes    Last attempt to quit: 04/14/2003    Years since quitting: 14.6  . Smokeless tobacco: Never Used  Substance and Sexual Activity  . Alcohol use: Yes    Comment: once in a while-social  . Drug use: Never    Comment: hemp oil   . Sexual activity: Not Currently  Lifestyle  . Physical activity:    Days per week: Not on file    Minutes per session: Not on file  . Stress: Not on file  Relationships  . Social connections:    Talks on phone: Not on file    Gets together: Not on file    Attends  religious service: Not on file    Active member of club or organization: Not on file    Attends meetings of clubs or organizations: Not on file    Relationship status: Not on file  Other Topics Concern  . Not on file  Social History Narrative  . Not on file     Family History:  The patient's family history includes Cancer (age of onset: 78) in her son; Cirrhosis in her mother; Gallbladder disease in her maternal grandmother; Heart disease in her sister; Other in her brother; Stomach cancer in her paternal grandfather.     ROS:   Please see the history of present illness.    ROS All other systems reviewed and are negative.   PHYSICAL EXAM:   VS:  BP (!) 152/60 (BP Location: Right Arm, Patient Position: Sitting, Cuff Size: Normal)   Pulse 62   Ht 5\' 2"  (1.575 m)   Wt 156 lb (70.8 kg)   BMI 28.53 kg/m    GEN: Well nourished, well developed, in no acute distress  HEENT: normal  Neck: no JVD, carotid bruits, or masses Cardiac: RRR;  2/6 SEM @ RUSB. No rubs, or gallops,no edema  Respiratory:  clear to auscultation bilaterally, normal work of breathing GI: soft, nontender, nondistended, + BS MS: no deformity or atrophy  Skin: warm and dry, no rash Neuro:  Alert and Oriented x 3, Strength and sensation are intact Psych: euthymic mood, full affect   Wt Readings from Last 3 Encounters:  11/25/17 156 lb (70.8 kg)  11/16/17 157 lb (71.2 kg)  11/01/17 158 lb 3.2 oz (71.8 kg)      Studies/Labs Reviewed:   EKG:  EKG is NOT ordered today.    Recent Labs: 05/24/2017: TSH 3.230 10/15/2017: ALT 18; B Natriuretic Peptide 107.7 10/20/2017: Magnesium 2.2 10/21/2017: BUN 9; Creatinine, Ser 0.74; Hemoglobin 11.2; Platelets 121; Potassium 4.3; Sodium 141   Lipid Panel    Component Value Date/Time   CHOL 145 05/25/2017 0844   CHOL 219 (H) 09/07/2012 0935  TRIG 136 05/25/2017 0844   TRIG 180 (H) 02/17/2013 1007   TRIG 326 (H) 09/07/2012 0935   HDL 47 05/25/2017 0844   HDL 51 02/17/2013  1007   HDL 47 09/07/2012 0935   CHOLHDL 3.1 05/25/2017 0844   CHOLHDL 5.2 10/09/2006 0435   VLDL 58 (H) 10/09/2006 0435   LDLCALC 71 05/25/2017 0844   LDLCALC 89 02/17/2013 1007   LDLCALC 107 (H) 09/07/2012 0935    Additional studies/ records that were reviewed today include:  TAVR OPERATIVE NOTE Date of Procedure:10/19/2017  Preoperative Diagnosis:Severe Aortic Stenosis  Procedure:   Transcatheter Aortic Valve Replacement - Percutaneous Transfemoral Approach Edwards Sapien 3 THV (size 41mm, model # 9600TFX, serial # [id number])  Co-Surgeons:Clarence H. Roxy Manns, MD and Sherren Mocha, MD  Pre-operative Echo Findings: ? Severe aortic stenosis ? Normalleft ventricular systolic function  Post-operative Echo Findings: ? Mildparavalvular leak ? Normalleft ventricular systolic function   ________________   Post operative echo 10/20/17 Study Conclusions - Left ventricle: The cavity size was normal. There was mild focal basal hypertrophy of the septum. Systolic function was vigorous. The estimated ejection fraction was in the range of 65% to 70%. Wall motion was normal; there were no regional wall motion abnormalities. Doppler parameters are consistent with abnormal left ventricular relaxation (grade 1 diastolic dysfunction). - Aortic valve: A new bioprosthesis was present and functioning normally. The prosthesis had a normal range of motion. The device appeared normal, had no rocking motion, and showed no evidence of dehiscence. There was trivial regurgitation. Mean gradient (S): 12 mm Hg. Valve area (VTI): 1.95 cm^2. Indexed valve area (VTI): 1.08 cm^2/m^2. Valve area (Vmax): 1.67 cm^2. Valve area (Vmean): 1.7 cm^2. - Mitral valve: Calcified annulus. Impressions: - Post-TAVR, the new bioprosthesis appears well seated without significant stenosis. Very trivial AR. LV EF is  normal.   ________________   2D ECHO 11/25/17 ( 1 month s/p TAVR)  Study Conclusion - Left ventricle: The cavity size was normal. There was mild focal basal hypertrophy of the septum. Systolic function was normal. The estimated ejection fraction was in the range of 60% to 65%. Wall motion was normal; there were no regional wall motion abnormalities. Doppler parameters are consistent with abnormal left ventricular relaxation (grade 1 diastolic dysfunction). - Aortic valve: A TAVR bioprosthesis was present and functioning normally. There was mild perivalvular regurgitation. Peak velocity (S): 244 cm/s. Mean gradient (S): 12 mm Hg. - Mitral valve: Severely calcified annulus. - Left atrium: The atrium was mildly dilated Impressions - Compared to the prior study, there has been no significant interval change.   ASSESSMENT & PLAN:   Severe AS s/p TAVR: 2D ECHO today shows EF 60%, normally functioning TAVR valve with mild PVL and mean gradient 12 mm Hg. She has NYHA class I symptoms. SBE prophylaxis discussed; she has amoxicillin. Plavix can be discontinued after 6 months of therapy (04/2018). She will continue on Aspirin 81 mg daily indefinitely.   HTN: BP elevated on arrival but 140/60 on my personal recheck. She will follow her BP at home and let us know if BP running consistently >140/90.  Pulmonary nodule: she is not high risk as she has a very short former smoking history. Discussed with patient and we have decided to not pursue further work up.    Medication Adjustments/Labs and Tests Ordered: Current medicines are reviewed at length with the patient today.  Concerns regarding medicines are outlined above.  Medication changes, Labs and Tests ordered today are listed in the Patient Instructions  below. Patient Instructions  Medication Instructions:  Your physician has recommended you make the following change in your medication: Decrease aspirin to 81 mg by mouth  once daily.   Stop Clopidogrel on January 9,2020  Labwork: none  Testing/Procedures None at this time.  Follow-Up: Your physician recommends that you schedule a follow-up appointment in: 3 months with Dr. Johnsie Cancel.  Your physician recommends that you schedule a follow-up appointment in: 11 months with K. Grandville Silos, [place]. You will have an echocardiogram the same day.  We will call you to schedule this appointment      Any Other Special Instructions Will Be Listed Below (If Applicable).   Check your blood pressure at home and keep record of readings.  Call us if consistently greater than 140/90  If you need a refill on your cardiac medications before your next appointment, please call your pharmacy.      Signed, Angelena Form, PA-C  11/25/2017 3:16 PM    [facility] [address], [place], [place]  [address] Phone: [phone]; Fax: [phone]

## 2017-11-25 NOTE — Patient Instructions (Addendum)
Medication Instructions:  Your physician has recommended you make the following change in your medication: Decrease aspirin to 81 mg by mouth once daily.   Stop Clopidogrel on January 9,2020  Labwork: none  Testing/Procedures None at this time.  Follow-Up: Your physician recommends that you schedule a follow-up appointment in: 3 months with Dr. Johnsie Cancel.  Your physician recommends that you schedule a follow-up appointment in: 11 months with K. Grandville Silos, [place]. You will have an echocardiogram the same day.  We will call you to schedule this appointment      Any Other Special Instructions Will Be Listed Below (If Applicable).   Check your blood pressure at home and keep record of readings.  Call us if consistently greater than 140/90  If you need a refill on your cardiac medications before your next appointment, please call your pharmacy.

## 2017-11-30 ENCOUNTER — Ambulatory Visit: Payer: Medicare Other | Admitting: Physician Assistant

## 2017-11-30 DIAGNOSIS — M1711 Unilateral primary osteoarthritis, right knee: Secondary | ICD-10-CM | POA: Diagnosis not present

## 2017-11-30 MED ORDER — HYLAN G-F 20 16 MG/2ML IX SOSY
16.0000 mg | PREFILLED_SYRINGE | INTRA_ARTICULAR | Status: AC | PRN
  Administered 2017-11-30: 16 mg via INTRA_ARTICULAR

## 2017-11-30 MED ORDER — LIDOCAINE HCL 1 % IJ SOLN
1.5000 mL | INTRAMUSCULAR | Status: AC | PRN
  Administered 2017-11-30: 1.5 mL

## 2017-11-30 NOTE — Progress Notes (Signed)
   Procedure Note  Patient: EDUARDA SCRIVENS             Date of Birth: 1935/05/08           MRN: [id number]             Visit Date: 11/30/2017  Procedures: Visit Diagnoses: Primary osteoarthritis of right knee Synvisc #1 right knee joint injection  Large Joint Inj: R knee on 11/30/2017 2:03 PM Indications: pain Details: 25 G 1.5 in needle, medial approach  Arthrogram: No  Medications: 1.5 mL lidocaine 1 %; 16 mg Hylan 16 MG/2ML Aspirate: 0 mL Outcome: tolerated well, no immediate complications Procedure, treatment alternatives, risks and benefits explained, specific risks discussed. Consent was given by the patient. Immediately prior to procedure a time out was called to verify the correct patient, procedure, equipment, support staff and site/side marked as required. Patient was prepped and draped in the usual sterile fashion.     Patient tolerated the procedure well.   Hazel Sams, PA-C

## 2017-12-07 ENCOUNTER — Ambulatory Visit: Payer: Medicare Other | Admitting: Physician Assistant

## 2017-12-07 DIAGNOSIS — M1711 Unilateral primary osteoarthritis, right knee: Secondary | ICD-10-CM

## 2017-12-07 MED ORDER — HYLAN G-F 20 16 MG/2ML IX SOSY
16.0000 mg | PREFILLED_SYRINGE | INTRA_ARTICULAR | Status: AC | PRN
Start: 2017-12-07 — End: 2017-12-07
  Administered 2017-12-07: 16 mg via INTRA_ARTICULAR

## 2017-12-07 MED ORDER — LIDOCAINE HCL 1 % IJ SOLN
1.5000 mL | INTRAMUSCULAR | Status: AC | PRN
  Administered 2017-12-07: 1.5 mL

## 2017-12-07 NOTE — Progress Notes (Signed)
   Procedure Note  Patient: [name]             Date of Birth: 1935/11/14           MRN: [id number]             Visit Date: 12/07/2017  Procedures: Visit Diagnoses: Primary osteoarthritis of right knee Synvisc #2 right knee B/B Large Joint Inj: R knee on 12/07/2017 2:30 PM Indications: pain Details: 25 G 1.5 in needle, medial approach  Arthrogram: No  Medications: 16 mg Hylan 16 MG/2ML; 1.5 mL lidocaine 1 % Aspirate: 0 mL Outcome: tolerated well, no immediate complications Procedure, treatment alternatives, risks and benefits explained, specific risks discussed. Consent was given by the patient. Immediately prior to procedure a time out was called to verify the correct patient, procedure, equipment, support staff and site/side marked as required. Patient was prepped and draped in the usual sterile fashion.      Patient tolerated the procedure well.  Hazel Sams, PA-C

## 2017-12-09 ENCOUNTER — Other Ambulatory Visit: Payer: Self-pay | Admitting: Family Medicine

## 2017-12-09 DIAGNOSIS — Z1239 Encounter for other screening for malignant neoplasm of breast: Secondary | ICD-10-CM

## 2017-12-11 ENCOUNTER — Other Ambulatory Visit: Payer: Self-pay | Admitting: Physician Assistant

## 2017-12-14 ENCOUNTER — Ambulatory Visit: Payer: Medicare Other | Admitting: Physician Assistant

## 2017-12-14 DIAGNOSIS — M1711 Unilateral primary osteoarthritis, right knee: Secondary | ICD-10-CM | POA: Diagnosis not present

## 2017-12-14 MED ORDER — LIDOCAINE HCL 1 % IJ SOLN
1.5000 mL | INTRAMUSCULAR | Status: AC | PRN
  Administered 2017-12-14: 1.5 mL

## 2017-12-14 MED ORDER — HYLAN G-F 20 16 MG/2ML IX SOSY
16.0000 mg | PREFILLED_SYRINGE | INTRA_ARTICULAR | Status: AC | PRN
  Administered 2017-12-14: 16 mg via INTRA_ARTICULAR

## 2017-12-14 NOTE — Telephone Encounter (Signed)
Last Visit: 11/16/17 Next Visit: 02/25/18  Okay to refill per Dr. Estanislado Pandy

## 2017-12-14 NOTE — Progress Notes (Signed)
   Procedure Note  Patient: [name]             Date of Birth: 1935/12/10           MRN: [id number]             Visit Date: 12/14/2017  Procedures: Visit Diagnoses: Primary osteoarthritis of right knee - Plan: Large Joint Inj: R knee Synvisc #3 Right knee joint injection  Large Joint Inj: R knee on 12/14/2017 1:16 PM Indications: pain Details: 27 G 1.5 in needle, medial approach  Arthrogram: No  Medications: 1.5 mL lidocaine 1 %; 16 mg Hylan 16 MG/2ML Aspirate: 0 mL Outcome: tolerated well, no immediate complications Procedure, treatment alternatives, risks and benefits explained, specific risks discussed. Consent was given by the patient. Immediately prior to procedure a time out was called to verify the correct patient, procedure, equipment, support staff and site/side marked as required. Patient was prepped and draped in the usual sterile fashion.     Patient tolerated the procedure well.   Hazel Sams, PA-C

## 2017-12-27 ENCOUNTER — Other Ambulatory Visit: Payer: Self-pay | Admitting: Family Medicine

## 2017-12-28 ENCOUNTER — Other Ambulatory Visit: Payer: Self-pay | Admitting: Family Medicine

## 2018-01-03 ENCOUNTER — Encounter: Payer: Self-pay | Admitting: Thoracic Surgery (Cardiothoracic Vascular Surgery)

## 2018-01-12 ENCOUNTER — Ambulatory Visit (INDEPENDENT_AMBULATORY_CARE_PROVIDER_SITE_OTHER): Payer: Medicare Other | Admitting: Family Medicine

## 2018-01-12 ENCOUNTER — Encounter: Payer: Self-pay | Admitting: Family Medicine

## 2018-01-12 VITALS — BP 122/55 | HR 61 | Temp 99.1°F | Ht 62.0 in | Wt 159.0 lb

## 2018-01-12 DIAGNOSIS — I10 Essential (primary) hypertension: Secondary | ICD-10-CM | POA: Diagnosis not present

## 2018-01-12 DIAGNOSIS — L729 Follicular cyst of the skin and subcutaneous tissue, unspecified: Secondary | ICD-10-CM | POA: Diagnosis not present

## 2018-01-12 MED ORDER — CLOPIDOGREL BISULFATE 75 MG PO TABS: 75.0000 mg | ORAL_TABLET | Freq: Every day | ORAL | 1 refills | Status: DC

## 2018-01-12 MED ORDER — METOPROLOL TARTRATE 50 MG PO TABS: 50.0000 mg | ORAL_TABLET | Freq: Two times a day (BID) | ORAL | 1 refills | Status: DC

## 2018-01-12 MED ORDER — ROSUVASTATIN CALCIUM 5 MG PO TABS: 5.0000 mg | ORAL_TABLET | Freq: Every day | ORAL | 1 refills | Status: DC

## 2018-01-12 MED ORDER — ASPIRIN EC 81 MG PO TBEC: 81.0000 mg | DELAYED_RELEASE_TABLET | Freq: Every day | ORAL | 3 refills | Status: AC

## 2018-01-12 NOTE — Patient Instructions (Signed)
Use warm compresses applied to the cyst 3 times per day.  If it starts getting bigger, becomes painful or is draining, come see me.  I will reach out to [name] about your Blood pressure.  Epidermal Cyst An epidermal cyst is sometimes called an epidermal inclusion cyst or an infundibular cyst. It is a sac made of skin tissue. The sac contains a substance called keratin. Keratin is a protein that is normally secreted through the hair follicles. When keratin becomes trapped in the top layer of skin (epidermis), it can form an epidermal cyst. Epidermal cysts are usually found on the face, neck, trunk, and genitals. These cysts are usually harmless (benign), and they may not cause symptoms unless they become infected. It is important not to pop epidermal cysts yourself. What are the causes? This condition may be caused by:  A blocked hair follicle.  A hair that curls and re-enters the skin instead of growing straight out of the skin (ingrown hair).  A blocked pore.  Irritated skin.  An injury to the skin.  Certain conditions that are passed along from parent to child (inherited).  Human papillomavirus (HPV).  What increases the risk? The following factors may make you more likely to develop an epidermal cyst:  Having acne.  Being overweight.  Wearing tight clothing.  What are the signs or symptoms? The only symptom of this condition may be a small, painless lump underneath the skin. When an epidermal cyst becomes infected, symptoms may include:  Redness.  Inflammation.  Tenderness.  Warmth.  Fever.  Keratin draining from the cyst. Keratin may look like a grayish-white, bad-smelling substance.  Pus draining from the cyst.  How is this diagnosed? This condition is diagnosed with a physical exam. In some cases, you may have a sample of tissue (biopsy) taken from your cyst to be examined under a microscope or tested for bacteria. You may be referred to a health care  provider who specializes in skin care (dermatologist). How is this treated? In many cases, epidermal cysts go away on their own without treatment. If a cyst becomes infected, treatment may include:  Opening and draining the cyst. After draining, minor surgery to remove the rest of the cyst may be done.  Antibiotic medicine to help prevent infection.  Injections of medicines (steroids) that help to reduce inflammation.  Surgery to remove the cyst. Surgery may be done if: ? The cyst becomes large. ? The cyst bothers you. ? There is a chance that the cyst could turn into cancer.  Follow these instructions at home:  Take over-the-counter and prescription medicines only as told by your health care provider.  If you were prescribed an antibiotic, use it as told by your health care provider. Do not stop using the antibiotic even if you start to feel better.  Keep the area around your cyst clean and dry.  Wear loose, dry clothing.  Do not try to pop your cyst.  Avoid touching your cyst.  Check your cyst every day for signs of infection.  Keep all follow-up visits as told by your health care provider. This is important. How is this prevented?  Wear clean, dry, clothing.  Avoid wearing tight clothing.  Keep your skin clean and dry. Shower or take baths every day.  Wash your body with a benzoyl peroxide wash when you shower or bathe. Contact a health care provider if:  Your cyst develops symptoms of infection.  Your condition is not improving or is getting  worse.  You develop a cyst that looks different from other cysts you have had.  You have a fever. Get help right away if:  Redness spreads from the cyst into the surrounding area. This information is not intended to replace advice given to you by your health care provider. Make sure you discuss any questions you have with your health care provider. Document Released: 02/29/2004 Document Revised: 11/27/2015 Document  Reviewed: 01/30/2015 Elsevier Interactive Patient Education  [employer].

## 2018-01-12 NOTE — Progress Notes (Signed)
Subjective: CC: HTN PCP: [name], DO HPI:[name] is a 82 y.o. female presenting to clinic today for:  1. Hypertension Patient report she was told to monitor her blood pressure closely after her visit with cardiology but states that she intermittently checks it when she is feeling tired.  It is been fluctuating and in her right arm her blood pressure is always elevated in the left arm is always normal.  She is not sure what to make of this. Meds: Compliant with prescribed medications from cardiology including Plavix, metoprolol and Crestor, Side effects: None  ROS: Denies headache, dizziness, visual changes, nausea, vomiting, chest pain, LE swelling, abdominal pain or shortness of breath.  Occasionally, she does feel overly exhausted and will go and lay down.  2.  Lump on her buttock Patient reports that she noticed a lump on the inside of the right buttock a few days ago.  It was somewhat tender to palpation but is not interfering with her ability to sit.  Denies any fevers or drainage.  She has not been doing anything for lesion.    ROS: Per HPI  Allergies  Allergen Reactions  . Tape Other (See Comments)    Dermatitis rash "with extended exposure"  . Prolia [Denosumab] Other (See Comments)    Arthralgia/ myalgia/ jaw pain/ headache   Past Medical History:  Diagnosis Date  . Chronic headaches   . Fibromyalgia   . GERD (gastroesophageal reflux disease)   . Hemorrhoids    INTERNAL--  POST BANDING 02/ 2015  . History of kidney stones   . Hyperlipidemia   . Hypertension   . Moderate aortic stenosis   . OSA (obstructive sleep apnea) CPAP INTOLERANT   . Osteoporosis   . S/P TAVR (transcatheter aortic valve replacement) 10/19/2017   20 mm Edwards Sapien 3 transcatheter heart valve placed via percutaneous right transfemoral approach   . Severe aortic stenosis   . Spinal stenosis, multilevel     Current Outpatient Medications:  .  acetaminophen (TYLENOL) 500 MG  tablet, Take 1,000 mg by mouth daily as needed for moderate pain or headache., Disp: , Rfl:  .  ALOE VERA PO, Take 1 capsule by mouth daily., Disp: , Rfl:  .  aspirin EC 81 MG tablet, Take 1 tablet (81 mg total) by mouth daily., Disp: 90 tablet, Rfl: 3 .  BEE POLLEN PO, Take 400 mg by mouth daily. , Disp: , Rfl:  .  clobetasol cream (TEMOVATE) 1.27 %, Apply 1 application topically 2 (two) times daily as needed. (Patient taking differently: Apply 1 application topically 2 (two) times daily as needed (for rash). ), Disp: 30 g, Rfl: 3 .  clopidogrel (PLAVIX) 75 MG tablet, Take 1 tablet (75 mg total) by mouth daily with breakfast., Disp: 90 tablet, Rfl: 1 .  Coenzyme Q10 (COQ-10) 100 MG CAPS, Take 100 mg by mouth daily., Disp: , Rfl:  .  diclofenac sodium (VOLTAREN) 1 % GEL, APPLY 3 GRAMS TO 3 LARGE JOINTS UP TO 3 TIMES DIALY, Disp: 300 g, Rfl: 0 .  metoprolol tartrate (LOPRESSOR) 50 MG tablet, Take 1 tablet (50 mg total) by mouth 2 (two) times daily., Disp: 180 tablet, Rfl: 1 .  OVER THE COUNTER MEDICATION, Take 1 capsule by mouth 2 (two) times daily. Hemp Oil, Disp: , Rfl:  .  polyethylene glycol (MIRALAX / GLYCOLAX) packet, Take 17 g by mouth daily as needed for moderate constipation., Disp: , Rfl:  .  Polyethylene Glycol 400 (BLINK  TEARS) 0.25 % SOLN, Place 1-2 drops into both eyes 3 (three) times daily. , Disp: , Rfl:  .  rosuvastatin (CRESTOR) 5 MG tablet, Take 1 tablet (5 mg total) by mouth daily. (Needs to be seen), Disp: 30 tablet, Rfl: 0 .  TURMERIC PO, Take 1 capsule by mouth daily., Disp: , Rfl:  .  vitamin B-12 (CYANOCOBALAMIN) 1000 MCG tablet, Take 1,000 mcg by mouth daily., Disp: , Rfl:  Social History   Socioeconomic History  . Marital status: Married    Spouse name: Not on file  . Number of children: 3  . Years of education: Not on file  . Highest education level: Not on file  Occupational History  . Not on file  Social Needs  . Financial resource strain: Not on file  . Food  insecurity:    Worry: Not on file    Inability: Not on file  . Transportation needs:    Medical: Not on file    Non-medical: Not on file  Tobacco Use  . Smoking status: Former Smoker    Packs/day: 0.25    Years: 5.00    Pack years: 1.25    Types: Cigarettes    Last attempt to quit: 04/14/2003    Years since quitting: 14.7  . Smokeless tobacco: Never Used  Substance and Sexual Activity  . Alcohol use: Yes    Comment: once in a while-social  . Drug use: Never    Comment: hemp oil   . Sexual activity: Not Currently  Lifestyle  . Physical activity:    Days per week: Not on file    Minutes per session: Not on file  . Stress: Not on file  Relationships  . Social connections:    Talks on phone: Not on file    Gets together: Not on file    Attends religious service: Not on file    Active member of club or organization: Not on file    Attends meetings of clubs or organizations: Not on file    Relationship status: Not on file  . Intimate partner violence:    Fear of current or ex partner: Not on file    Emotionally abused: Not on file    Physically abused: Not on file    Forced sexual activity: Not on file  Other Topics Concern  . Not on file  Social History Narrative  . Not on file   Family History  Problem Relation Age of Onset  . Cirrhosis Mother        drinking  . Other Brother        duodenal ulcer  . Heart disease Sister   . Cancer Son 46       colon  . Gallbladder disease Maternal Grandmother   . Stomach cancer Paternal Grandfather     Objective: Office vital signs reviewed. BP (!) 122/55   Pulse 61   Temp 99.1 F (37.3 C) (Oral)   Ht 5\' 2"  (1.575 m)   Wt 159 lb (72.1 kg)   BMI 29.08 kg/m   Physical Examination:  General: Awake, alert, well nourished, No acute distress HEENT: sclera white, MMM Cardio: regular rate and rhythm, S1S2 heard, blowing murmur  Pulm: clear to auscultation bilaterally, no wheezes, rhonchi or rales; normal work of breathing on  room air Extremities: warm, well perfused, No edema, cyanosis or clubbing; +2 pulses bilaterally Skin: ~5 mm mobile, well circumscribed cyst appreciated along the right inner gluteal fold.  No erythema, induration or fluctuance.  No exudate.  Minimally tender to palpation.  Assessment/ Plan: 82 y.o. female   1. Essential hypertension Blood pressure in her right arm was 153/56 and blood pressure in left arm was 122/55.  I am not sure why she is having such a large discrepancy between right and left upper extremities.  I do wonder if her underlying cardiac issues are contributing.  I will reach out to her cardiologist to see if there is anything that he would like me to do prior to her follow-up with them.  I have encouraged her to go ahead and schedule follow-up with cardiology as there was talk about medication adjustment by her cardiologist at last visit.  I have not increased her blood pressure regimen given normal blood pressures in the left upper extremity.  I do not want to exacerbate hypotension.  2. Cyst of buttocks Does not appear infected today.  I have recommended sitz bath/warm compresses several times per day.  Reasons for return discussed.  She will follow-up with me as needed.   Meds ordered this encounter  Medications  . metoprolol tartrate (LOPRESSOR) 50 MG tablet    Sig: Take 1 tablet (50 mg total) by mouth 2 (two) times daily.    Dispense:  180 tablet    Refill:  1  . rosuvastatin (CRESTOR) 5 MG tablet    Sig: Take 1 tablet (5 mg total) by mouth daily.    Dispense:  90 tablet    Refill:  1    30 days only Needs to be seen)  . clopidogrel (PLAVIX) 75 MG tablet    Sig: Take 1 tablet (75 mg total) by mouth daily with breakfast.    Dispense:  90 tablet    Refill:  1  . aspirin EC 81 MG tablet    Sig: Take 1 tablet (81 mg total) by mouth daily.    Dispense:  90 tablet    Refill:  [place], [place] [phone]

## 2018-01-13 ENCOUNTER — Telehealth: Payer: Self-pay | Admitting: Cardiovascular Disease

## 2018-01-13 DIAGNOSIS — I10 Essential (primary) hypertension: Secondary | ICD-10-CM

## 2018-01-13 NOTE — Telephone Encounter (Signed)
New Message:     Pt c/o BP issue: STAT if pt c/o blurred vision, one-sided weakness or slurred speech  1. What are your last 5 BP readings?  152/60, 137/59, 150/57  2. Are you having any other symptoms (ex. Dizziness, headache, blurred vision, passed out)? Headache   3. What is your BP issue? High B/P

## 2018-01-13 NOTE — Telephone Encounter (Signed)
Called patient back about her message. Patient was instructed to call if her BP was consistently greater than140/90. Patient stated her BP has been running high off and on for about a month and she gets little headaches when it's high. Patient's SBP has been ranging between  137 to 152. HR is 57 to 60. Patient is a TAVR patient, and is 4 months out. Will forward to Dr. Johnsie Cancel and Lou Cal PA, who saw patient last for advisement.

## 2018-01-17 MED ORDER — LOSARTAN POTASSIUM 25 MG PO TABS: 25.0000 mg | ORAL_TABLET | Freq: Every day | ORAL | 3 refills | Status: DC

## 2018-01-17 MED ORDER — METOPROLOL TARTRATE 50 MG PO TABS: 50.0000 mg | ORAL_TABLET | Freq: Two times a day (BID) | ORAL | 3 refills | Status: DC

## 2018-01-17 NOTE — Telephone Encounter (Signed)
Left message for patient to call back  

## 2018-01-17 NOTE — Telephone Encounter (Signed)
Will defer to Dr. Johnsie Cancel

## 2018-01-17 NOTE — Telephone Encounter (Signed)
Patient aware of Dr. Kyla Balzarine recommendations. Per Dr. Johnsie Cancel, start Cozaar 25 mg in afternoon between her metoprolol doses, check BMET in 4 weeks, and f/u with PA or PharmD for BP check. Made patient an appointment with PA.  Patient verbalized understanding.

## 2018-01-17 NOTE — Telephone Encounter (Signed)
Start cozaar 25 mg in afternoon between her lopressor doses check BMET 4 weeks and f/u with PA  Or Pharm D for BP check

## 2018-01-25 ENCOUNTER — Encounter: Payer: Self-pay | Admitting: Physician Assistant

## 2018-02-04 NOTE — Progress Notes (Signed)
Office Visit Note  Patient: [name]             Date of Birth: Aug 06, 1935           MRN: [id number]             PCP: [name], DO Referring: [name], DO Visit Date: 02/18/2018 Occupation: @GUAROCC @  Subjective:  Pain in knee joints.   History of Present Illness: [name] is a 82 y.o. female with history of osteoarthritis, degenerative disc disease, fibromyalgia and osteoporosis.  She states she continues to have some discomfort and stiffness in her knee joints.  She has difficulty getting up from the chair and initiating her walk.  She has been using Voltaren gel that has been very helpful.  She wants a refill of that.  She has some stiffness in her hands but is tolerable.  Activities of Daily Living:  Patient reports morning stiffness for 5 minutes.   Patient Denies nocturnal pain.  Difficulty dressing/grooming: Denies Difficulty climbing stairs: Reports Difficulty getting out of chair: Reports Difficulty using hands for taps, buttons, cutlery, and/or writing: Denies  Review of Systems  Constitutional: Positive for fatigue. Negative for night sweats, weight gain and weight loss.  HENT: Negative for mouth sores, trouble swallowing, trouble swallowing, mouth dryness and nose dryness.   Eyes: Positive for dryness. Negative for pain, redness and visual disturbance.  Respiratory: Negative for cough, shortness of breath and difficulty breathing.   Cardiovascular: Positive for hypertension. Negative for chest pain, palpitations, irregular heartbeat and swelling in legs/feet.  Gastrointestinal: Negative for blood in stool, constipation and diarrhea.  Endocrine: Negative for increased urination.  Genitourinary: Negative for vaginal dryness.  Musculoskeletal: Positive for arthralgias, joint pain and morning stiffness. Negative for joint swelling, myalgias, muscle weakness, muscle tenderness and myalgias.  Skin: Negative for color change, rash, hair loss, skin  tightness, ulcers and sensitivity to sunlight.  Allergic/Immunologic: Negative for susceptible to infections.  Neurological: Negative for dizziness, memory loss, night sweats and weakness.  Hematological: Negative for swollen glands.  Psychiatric/Behavioral: Negative for depressed mood and sleep disturbance. The patient is not nervous/anxious.     PMFS History:  Patient Active Problem List   Diagnosis Date Noted  . S/P TAVR (transcatheter aortic valve replacement) 10/19/2017  . Chronic fatigue 08/25/2017  . DDD (degenerative disc disease), cervical 07/08/2017  . Bursitis of left shoulder 07/08/2017  . Fibromyalgia 07/08/2017  . Atherosclerosis of abdominal aorta ([place]) 06/29/2017  . Mixed hyperlipidemia 03/19/2017  . Osteoporosis 04/21/2016  . Internal hemorrhoids with other complication 87/86/7672  . GI bleed 05/30/2013  . NSAID long-term use 05/30/2013  . Severe aortic stenosis 05/29/2013  . DIVERTICULOSIS-COLON 01/13/2010  . CONSTIPATION 01/13/2010  . PERSONAL HISTORY OF COLONIC POLYPS 01/13/2010  . Essential hypertension 08/28/2008  . GERD 08/28/2008  . PALPITATIONS 08/28/2008    Past Medical History:  Diagnosis Date  . Chronic headaches   . Fibromyalgia   . GERD (gastroesophageal reflux disease)   . Hemorrhoids    INTERNAL--  POST BANDING 02/ 2015  . History of kidney stones   . Hyperlipidemia   . Hypertension   . Moderate aortic stenosis   . OSA (obstructive sleep apnea) CPAP INTOLERANT   . Osteoporosis   . S/P TAVR (transcatheter aortic valve replacement) 10/19/2017   20 mm Edwards Sapien 3 transcatheter heart valve placed via percutaneous right transfemoral approach   . Severe aortic stenosis   . Spinal stenosis, multilevel  Family History  Problem Relation Age of Onset  . Cirrhosis Mother        drinking  . Other Brother        duodenal ulcer  . Heart disease Sister   . Cancer Son 19       colon  . Gallbladder disease Maternal Grandmother   .  Stomach cancer Paternal Grandfather    Past Surgical History:  Procedure Laterality Date  . ANTERIOR CERVICAL DECOMP/DISCECTOMY FUSION  11-11-1999   C5 - C6  . APPENDECTOMY  AGE 40  . CARDIAC CATHETERIZATION  2008  DR [facility]   ESSENTIALLY NORMAL  . CATARACT EXTRACTION W/ INTRAOCULAR LENS  IMPLANT, BILATERAL  2012  . FLEXIBLE SIGMOIDOSCOPY N/A 06/01/2013   Procedure: FLEXIBLE SIGMOIDOSCOPY;  Surgeon: Inda Castle, MD;  Location: [facility];  Service: Endoscopy;  Laterality: N/A;  may need hemorrhoidal banding  . HEMORRHOIDECTOMY WITH HEMORRHOID BANDING  08-07-2010  . LAPAROSCOPIC CHOLECYSTECTOMY  1990  . RIGHT/LEFT HEART CATH AND CORONARY ANGIOGRAPHY N/A 09/29/2017   Procedure: RIGHT/LEFT HEART CATH AND CORONARY ANGIOGRAPHY;  Surgeon: Sherren Mocha, MD;  Location: [place];  Service: Cardiovascular;  Laterality: N/A;  . SHOULDER ARTHROSCOPY WITH OPEN ROTATOR CUFF REPAIR AND DISTAL CLAVICLE ACROMINECTOMY Right 05/25/2012   Procedure: SHOULDER ARTHROSCOPY WITH OPEN ROTATOR CUFF REPAIR AND DISTAL CLAVICLE ACROMINECTOMY;  Surgeon: Magnus Sinning, MD;  Location: [place];  Service: Orthopedics;  Laterality: Right;  RIGHT SHOULDER ARTHROSCOPY WITH DERBRIDEMENTOF LABRAL/BICEP TENDON, OPEN DISTAL CLAVICLE RESECTION, ANTERIOR ACROMINECTOMY ROTATOR CUFF REPAIR ANESTHESIA: GENERAL/SCALENE NERVE BLOCK  . TEE WITHOUT CARDIOVERSION Bilateral 10/19/2017   Procedure: TRANSESOPHAGEAL ECHOCARDIOGRAM (TEE);  Surgeon: Sherren Mocha, MD;  Location: [place];  Service: Open Heart Surgery;  Laterality: Bilateral;  . TENOSYNOVECTOMY Left 01/04/2014   Procedure: LEFT WRIST EXTENSOR TENOSYNOVECTOMY;  Surgeon: Linna Hoff, MD;  Location: [facility] [place];  Service: Orthopedics;  Laterality: Left;  . THYROIDECTOMY  AGE 62   GOITER  . TONSILLECTOMY AND ADENOIDECTOMY  AGE 24  . TRANSCATHETER AORTIC VALVE REPLACEMENT, TRANSFEMORAL  10/19/2017  . TRANSCATHETER AORTIC VALVE  REPLACEMENT, TRANSFEMORAL Bilateral 10/19/2017   Procedure: TRANSCATHETER AORTIC VALVE REPLACEMENT, TRANSFEMORAL;  Surgeon: Sherren Mocha, MD;  Location: [place];  Service: Open Heart Surgery;  Laterality: Bilateral;  . TRANSTHORACIC ECHOCARDIOGRAM  last one 04-20-2013  DR [facility]    NORMAL LVSF/ EF 60-65%/ MODERATE  AV  STENOSIS WITH NO AR /  MILD LAE  . UMBILICAL HERNIA REPAIR  JUNE 2006  . VAGINAL HYSTERECTOMY  01-31-2001   ANTERIOR & POSTERIOR REPAIR/ TRANSVAGINAL BLADDER SLING   Social History   Social History Narrative  . Not on file    Objective: Vital Signs: BP (!) 131/56 (BP Location: Left Arm, Patient Position: Sitting, Cuff Size: Normal)   Pulse 62   Resp 13   Ht 5\' 2"  (1.575 m)   Wt 161 lb (73 kg)   BMI 29.45 kg/m    Physical Exam  Constitutional: She is oriented to person, place, and time. She appears well-developed and well-nourished.  HENT:  Head: Normocephalic and atraumatic.  Eyes: Conjunctivae and EOM are normal.  Neck: Normal range of motion.  Cardiovascular: Normal rate, regular rhythm, normal heart sounds and intact distal pulses.  Pulmonary/Chest: Effort normal and breath sounds normal.  Abdominal: Soft. Bowel sounds are normal.  Lymphadenopathy:    She has no cervical adenopathy.  Neurological: She is alert and oriented to person, place, and time.  Skin: Skin is warm and dry.  Capillary refill takes less than 2 seconds.  Psychiatric: She has a normal mood and affect. Her behavior is normal.  Nursing note and vitals reviewed.    Musculoskeletal Exam: Limited range of motion.  Shoulder joints elbow joints wrist joint MCPs PIPs DIPs been good range of motion.  She has warmth and some swelling in her right knee joint without any effusion.  Ankle joints MTPs PIPs were in good range of motion.  CDAI Exam: CDAI Score: Not documented Patient Global Assessment: Not documented; Provider Global Assessment: Not documented Swollen: Not documented; Tender: Not  documented Joint Exam   Not documented   There is currently no information documented on the homunculus. Go to the Rheumatology activity and complete the homunculus joint exam.  Investigation: No additional findings.  Imaging: No results found.  Recent Labs: Lab Results  Component Value Date   WBC 7.1 10/21/2017   HGB 11.2 (L) 10/21/2017   PLT 121 (L) 10/21/2017   NA 140 02/14/2018   K 5.1 02/14/2018   CL 100 02/14/2018   CO2 26 02/14/2018   GLUCOSE 98 02/14/2018   BUN 15 02/14/2018   CREATININE 0.87 02/14/2018   BILITOT 0.7 10/15/2017   ALKPHOS 56 10/15/2017   AST 20 10/15/2017   ALT 18 10/15/2017   PROT 6.7 10/15/2017   ALBUMIN 3.7 10/15/2017   CALCIUM 10.1 02/14/2018   GFRAA 73 02/14/2018    Speciality Comments: No specialty comments available.  Procedures:  No procedures performed Allergies: Tape and Prolia [denosumab]   Assessment / Plan:     Visit Diagnoses: Positive Danise (antinuclear antibody) - Repeat Anielle negative, RNP positive.  Patient has no clinical features of autoimmune disease.  Primary osteoarthritis of both hands-she has DIP and PIP thickening consistent with osteoarthritis.  Joint protection muscle strengthening was discussed.  Primary osteoarthritis of both knees - Right knee x-ray revealed severe osteoarthritis and moderate chondromalacia patella.  Left knee moderate OA.  She continues to have discomfort in her bilateral knee joints.  I offered cortisone injection which she declined.  She has been using Voltaren gel which is been effective.  A prescription refill Voltaren gel was given.  DDD (degenerative disc disease), cervical-she has some limitation of range of motion of her cervical spine.  Age-related osteoporosis without current pathological fracture-she is followed up by her PCP.  She at this point does not want to take any treatment.  Family history of lupus erythematosus - Daughter  Fibromyalgia-she continues to have some generalized  pain and discomfort.  Need for regular exercise was discussed.  History of coronary artery disease  History of diverticulosis  History of kidney stones  History of anemia  Essential hypertension  Aortic stenosis, moderate - She has valve replacement on 10/19/17.  She has been participating in a cardio rehab program.  Gastroesophageal reflux disease without esophagitis   Orders: No orders of the defined types were placed in this encounter.  Meds ordered this encounter  Medications  . diclofenac sodium (VOLTAREN) 1 % GEL    Sig: APPLY 3 GRAMS TO 3 LARGE JOINTS UP TO 3 TIMES DAILY    Dispense:  300 g    Refill:  3    Face-to-face time spent with patient was 30 minutes. Greater than 50% of time was spent in counseling and coordination of care.  Follow-Up Instructions: Return in about 6 months (around 08/19/2018) for Osteoarthritis.   Bo Merino, MD  Note - This record has been created using Editor, commissioning.  Chart creation  errors have been sought, but may not always  have been located. Such creation errors do not reflect on  the standard of medical care.

## 2018-02-13 NOTE — Progress Notes (Signed)
Cardiology Office Note    Date:  02/14/2018   ID:  [name], DOB [birth year], MRN [id number]  PCP:  [name], DO  Cardiologist:  Dr. Ala Bent: Dr. Jeronimo Greaves. Roxy Manns   Chief Complaint: BP follow up  History of Present Illness:   [name] is a 82 y.o. female with a history of HTN, HLD, fibromyalgia, GERD and severe ASs/p TAVR (10/19/17) who presents to clinic for blood pressure follow up.  She has a history of aortic stenosis that has been followed with periodic echocardiograms. Routine echocardiogram on 09/07/2017 showed progression of her aortic stenosis with a mean gradient of 40 mmHg and a peak gradient of 74 mmHg. The dimensionless index was 0.26. Left ventricular ejection fraction remained 65 to 70% with grade 1 diastolic dysfunction. She underwent cardiac catheterization by Dr. Burt Knack on 09/29/2017 and this showed widely patent coronary arteries with minor luminal irregularities. S/p successful TAVR with a91mm Edwards Sapien 3 THV via the TF approach on 10/19/17. Post operative echoshowed EF 65%, normally functioning TAVR with mean gradient 12 mmHg and trivial PVL.On ASA and plavix.   Added cozaar 25mg  in afternoon by Dr. Johnsie Cancel 01/17/18 for elevated BP.  Here today for follow up. She intermitently feels flush >> BP runs high. Home reading 130-160s/50-60s. HR in 60-80s. However she has intermittent palpitations (never took BP and HR at this time). This makes her very fatigued and sometime dyspneic. One time BP machine read as "irregular" but does not remember numbers. Drinks one cup of coffee everyday. No soda. Denies chest pain, syncope, LE edema, orthopnea, PND of syncope. No bleeding issue.   Past Medical History:  Diagnosis Date  . Chronic headaches   . Fibromyalgia   . GERD (gastroesophageal reflux disease)   . Hemorrhoids    INTERNAL--  POST BANDING 02/ 2015  . History of kidney stones   . Hyperlipidemia   . Hypertension   . Moderate aortic stenosis     . OSA (obstructive sleep apnea) CPAP INTOLERANT   . Osteoporosis   . S/P TAVR (transcatheter aortic valve replacement) 10/19/2017   20 mm Edwards Sapien 3 transcatheter heart valve placed via percutaneous right transfemoral approach   . Severe aortic stenosis   . Spinal stenosis, multilevel     Past Surgical History:  Procedure Laterality Date  . ANTERIOR CERVICAL DECOMP/DISCECTOMY FUSION  11-11-1999   C5 - C6  . APPENDECTOMY  AGE 42  . CARDIAC CATHETERIZATION  2008  DR [facility]   ESSENTIALLY NORMAL  . CATARACT EXTRACTION W/ INTRAOCULAR LENS  IMPLANT, BILATERAL  2012  . FLEXIBLE SIGMOIDOSCOPY N/A 06/01/2013   Procedure: FLEXIBLE SIGMOIDOSCOPY;  Surgeon: Inda Castle, MD;  Location: [facility];  Service: Endoscopy;  Laterality: N/A;  may need hemorrhoidal banding  . HEMORRHOIDECTOMY WITH HEMORRHOID BANDING  08-07-2010  . LAPAROSCOPIC CHOLECYSTECTOMY  1990  . RIGHT/LEFT HEART CATH AND CORONARY ANGIOGRAPHY N/A 09/29/2017   Procedure: RIGHT/LEFT HEART CATH AND CORONARY ANGIOGRAPHY;  Surgeon: Sherren Mocha, MD;  Location: [place] [facility];  Service: Cardiovascular;  Laterality: N/A;  . SHOULDER ARTHROSCOPY WITH OPEN ROTATOR CUFF REPAIR AND DISTAL CLAVICLE ACROMINECTOMY Right 05/25/2012   Procedure: SHOULDER ARTHROSCOPY WITH OPEN ROTATOR CUFF REPAIR AND DISTAL CLAVICLE ACROMINECTOMY;  Surgeon: Magnus Sinning, MD;  Location: [place];  Service: Orthopedics;  Laterality: Right;  RIGHT SHOULDER ARTHROSCOPY WITH DERBRIDEMENTOF LABRAL/BICEP TENDON, OPEN DISTAL CLAVICLE RESECTION, ANTERIOR ACROMINECTOMY ROTATOR CUFF REPAIR ANESTHESIA: GENERAL/SCALENE NERVE BLOCK  . TEE WITHOUT CARDIOVERSION  Bilateral 10/19/2017   Procedure: TRANSESOPHAGEAL ECHOCARDIOGRAM (TEE);  Surgeon: Sherren Mocha, MD;  Location: [place];  Service: Open Heart Surgery;  Laterality: Bilateral;  . TENOSYNOVECTOMY Left 01/04/2014   Procedure: LEFT WRIST EXTENSOR TENOSYNOVECTOMY;  Surgeon: Linna Hoff, MD;   Location: [facility];  Service: Orthopedics;  Laterality: Left;  . THYROIDECTOMY  AGE 21   GOITER  . TONSILLECTOMY AND ADENOIDECTOMY  AGE 15  . TRANSCATHETER AORTIC VALVE REPLACEMENT, TRANSFEMORAL  10/19/2017  . TRANSCATHETER AORTIC VALVE REPLACEMENT, TRANSFEMORAL Bilateral 10/19/2017   Procedure: TRANSCATHETER AORTIC VALVE REPLACEMENT, TRANSFEMORAL;  Surgeon: Sherren Mocha, MD;  Location: [facility];  Service: Open Heart Surgery;  Laterality: Bilateral;  . TRANSTHORACIC ECHOCARDIOGRAM  last one 04-20-2013  DR [facility]    NORMAL LVSF/ EF 60-65%/ MODERATE  AV  STENOSIS WITH NO AR /  MILD LAE  . UMBILICAL HERNIA REPAIR  JUNE 2006  . VAGINAL HYSTERECTOMY  01-31-2001   ANTERIOR & POSTERIOR REPAIR/ TRANSVAGINAL BLADDER SLING    Current Medications: Prior to Admission medications   Medication Sig Start Date End Date Taking? Authorizing Provider  acetaminophen (TYLENOL) 500 MG tablet Take 1,000 mg by mouth daily as needed for moderate pain or headache.    [provider]  ALOE VERA PO Take 1 capsule by mouth daily.    [provider]  aspirin EC 81 MG tablet Take 1 tablet (81 mg total) by mouth daily. 01/12/18   [name], DO  BEE POLLEN PO Take 400 mg by mouth daily.     [provider]  clobetasol cream (TEMOVATE) 4.40 % Apply 1 application topically 2 (two) times daily as needed. Patient taking differently: Apply 1 application topically 2 (two) times daily as needed (for rash).  03/19/17   [name], MD  clopidogrel (PLAVIX) 75 MG tablet Take 1 tablet (75 mg total) by mouth daily with breakfast. 01/12/18   [name], DO  Coenzyme Q10 (COQ-10) 100 MG CAPS Take 100 mg by mouth daily.    [provider]  diclofenac sodium (VOLTAREN) 1 % GEL APPLY 3 GRAMS TO 3 LARGE JOINTS UP TO 3 TIMES DIALY 12/14/17   Bo Merino, MD  losartan (COZAAR) 25 MG tablet Take 1 tablet (25 mg total) by mouth daily. 01/17/18   [name], MD    metoprolol tartrate (LOPRESSOR) 50 MG tablet Take 1 tablet (50 mg total) by mouth 2 (two) times daily. 01/17/18   [name], MD  OVER THE COUNTER MEDICATION Take 1 capsule by mouth 2 (two) times daily. Hemp Oil    [provider]  polyethylene glycol (MIRALAX / GLYCOLAX) packet Take 17 g by mouth daily as needed for moderate constipation.    [provider]  Polyethylene Glycol 400 (BLINK TEARS) 0.25 % SOLN Place 1-2 drops into both eyes 3 (three) times daily.     [provider]  rosuvastatin (CRESTOR) 5 MG tablet Take 1 tablet (5 mg total) by mouth daily. 01/12/18   [name], DO  TURMERIC PO Take 1 capsule by mouth daily.    [provider]  vitamin B-12 (CYANOCOBALAMIN) 1000 MCG tablet Take 1,000 mcg by mouth daily.    [provider]    Allergies:   Tape and Prolia [denosumab]   Social History   Socioeconomic History  . Marital status: Married    Spouse name: Not on file  . Number of children: 3  . Years of education: Not on file  .  Highest education level: Not on file  Occupational History  . Not on file  Social Needs  . Financial resource strain: Not on file  . Food insecurity:    Worry: Not on file    Inability: Not on file  . Transportation needs:    Medical: Not on file    Non-medical: Not on file  Tobacco Use  . Smoking status: Former Smoker    Packs/day: 0.25    Years: 5.00    Pack years: 1.25    Types: Cigarettes    Last attempt to quit: 04/14/2003    Years since quitting: 14.8  . Smokeless tobacco: Never Used  Substance and Sexual Activity  . Alcohol use: Yes    Comment: once in a while-social  . Drug use: Never    Comment: hemp oil   . Sexual activity: Not Currently  Lifestyle  . Physical activity:    Days per week: Not on file    Minutes per session: Not on file  . Stress: Not on file  Relationships  . Social connections:    Talks on phone: Not on file    Gets together: Not on file     Attends religious service: Not on file    Active member of club or organization: Not on file    Attends meetings of clubs or organizations: Not on file    Relationship status: Not on file  Other Topics Concern  . Not on file  Social History Narrative  . Not on file     Family History:  The patient's family history includes Cancer (age of onset: 59) in her son; Cirrhosis in her mother; Gallbladder disease in her maternal grandmother; Heart disease in her sister; Other in her brother; Stomach cancer in her paternal grandfather.   ROS:   Please see the history of present illness.    ROS All other systems reviewed and are negative.   PHYSICAL EXAM:   VS:  BP (!) 170/60   Pulse 70   Ht 5\' 2"  (1.575 m)   Wt 161 lb 6.4 oz (73.2 kg)   SpO2 94%   BMI 29.52 kg/m    GEN: Well nourished, well developed, in no acute distress  HEENT: normal  Neck: no JVD, carotid bruits, or masses Cardiac: RRR; no murmurs, rubs, or gallops,no edema  Respiratory:  clear to auscultation bilaterally, normal work of breathing GI: soft, nontender, nondistended, + BS MS: no deformity or atrophy  Skin: warm and dry, no rash Neuro:  Alert and Oriented x 3, Strength and sensation are intact Psych: euthymic mood, full affect  Wt Readings from Last 3 Encounters:  02/14/18 161 lb 6.4 oz (73.2 kg)  01/12/18 159 lb (72.1 kg)  11/25/17 156 lb (70.8 kg)      Studies/Labs Reviewed:   EKG:  EKG is not  ordered today.    Recent Labs: 05/24/2017: TSH 3.230 10/15/2017: ALT 18; B Natriuretic Peptide 107.7 10/20/2017: Magnesium 2.2 10/21/2017: BUN 9; Creatinine, Ser 0.74; Hemoglobin 11.2; Platelets 121; Potassium 4.3; Sodium 141   Lipid Panel    Component Value Date/Time   CHOL 145 05/25/2017 0844   CHOL 219 (H) 09/07/2012 0935   TRIG 136 05/25/2017 0844   TRIG 180 (H) 02/17/2013 1007   TRIG 326 (H) 09/07/2012 0935   HDL 47 05/25/2017 0844   HDL 51 02/17/2013 1007   HDL 47 09/07/2012 0935   CHOLHDL 3.1  05/25/2017 0844   CHOLHDL 5.2 10/09/2006 0435   VLDL 58 (  H) 10/09/2006 0435   LDLCALC 71 05/25/2017 0844   LDLCALC 89 02/17/2013 1007   LDLCALC 107 (H) 09/07/2012 0935    Additional studies/ records that were reviewed today include:    2D ECHO 11/25/17 ( 1 month s/p TAVR)  Study Conclusion - Left ventricle: The cavity size was normal. There was mild focal basal hypertrophy of the septum. Systolic function was normal. The estimated ejection fraction was in the range of 60% to 65%. Wall motion was normal; there were no regional wall motion abnormalities. Doppler parameters are consistent with abnormal left ventricular relaxation (grade 1 diastolic dysfunction). - Aortic valve: A TAVR bioprosthesis was present and functioning normally. There was mild perivalvular regurgitation. Peak velocity (S): 244 cm/s. Mean gradient (S): 12 mm Hg. - Mitral valve: Severely calcified annulus. - Left atrium: The atrium was mildly dilated Impressions - Compared to the prior study, there has been no significant interval change.   RIGHT/LEFT HEART CATH AND CORONARY ANGIOGRAPHY  09/29/17  Conclusion   1.  Known severe aortic stenosis by noninvasive assessment with heavily calcified and restricted aortic valve leaflets biplane fluoroscopy 2.  Heavy mitral annular calcium 3.  Widely patent coronary arteries with minor luminal irregularities 4.  Mildly elevated right heart pressures   Recommendation: Continued multidisciplinary heart team evaluation for treatment of severe aortic stenosis     ASSESSMENT & PLAN:    1. HTN -Home reading 130-160s/50-60s. Here 170/60s. Continue metoprolol at current dose. Increase losartan to 50mg  qd. Concern for underlying anxiety/stree. Bring BP machine during follow up. Check BMET today.   2. Severe AS s/p TVAR - No dyspnea. Continue ASA and plavix.   3. Palpitations - Occurs everyday but may skip for day or two. Symptomatic. Advised get  vitals when has palpitation. Complete cessation of caffeinated  products. Get ZIO monitor. Says has some type of Allergy with monitor remotely due to tape.    Medication Adjustments/Labs and Tests Ordered: Current medicines are reviewed at length with the patient today.  Concerns regarding medicines are outlined above.  Medication changes, Labs and Tests ordered today are listed in the Patient Instructions below. Patient Instructions  Medication Instructions:  Your physician has recommended you make the following change in your medication:  1.  INCREASE the Losartan to 50 mg daily  If you need a refill on your cardiac medications before your next appointment, please call your pharmacy.   Lab work: TODAY:  BMET  If you have labs (blood work) drawn today and your tests are completely normal, you will receive your results only by: Marland Kitchen MyChart Message (if you have MyChart) OR . A paper copy in the mail If you have any lab test that is abnormal or we need to change your treatment, we will call you to review the results.  Testing/Procedures: Your physician has recommended that you wear a ZIO event monitor for 3 days.     Follow-Up: Your physician recommends that you schedule a follow-up appointment in: [place] DR. Johnsie Cancel     Signed, Leanor Kail, PA  02/14/2018 10:01 AM    [address], [place], [place]  [address] Phone: [phone]; Fax: [phone]

## 2018-02-14 ENCOUNTER — Encounter: Payer: Self-pay | Admitting: Physician Assistant

## 2018-02-14 ENCOUNTER — Ambulatory Visit: Payer: Medicare Other | Admitting: Physician Assistant

## 2018-02-14 ENCOUNTER — Other Ambulatory Visit: Payer: Medicare Other | Admitting: *Deleted

## 2018-02-14 VITALS — BP 170/60 | HR 70 | Ht 62.0 in | Wt 161.4 lb

## 2018-02-14 DIAGNOSIS — I10 Essential (primary) hypertension: Secondary | ICD-10-CM

## 2018-02-14 DIAGNOSIS — R002 Palpitations: Secondary | ICD-10-CM | POA: Diagnosis not present

## 2018-02-14 DIAGNOSIS — Z952 Presence of prosthetic heart valve: Secondary | ICD-10-CM

## 2018-02-14 LAB — BASIC METABOLIC PANEL
BUN / CREAT RATIO: 17 (ref 12–28)
BUN: 15 mg/dL (ref 8–27)
CALCIUM: 10.1 mg/dL (ref 8.7–10.3)
CHLORIDE: 100 mmol/L (ref 96–106)
CO2: 26 mmol/L (ref 20–29)
Creatinine, Ser: 0.87 mg/dL (ref 0.57–1.00)
GFR calc non Af Amer: 63 mL/min/{1.73_m2} (ref >59)
GFR, EST AFRICAN AMERICAN: 73 mL/min/{1.73_m2} (ref >59)
GLUCOSE: 98 mg/dL (ref 65–99)
POTASSIUM: 5.1 mmol/L (ref 3.5–5.2)
Sodium: 140 mmol/L (ref 134–144)

## 2018-02-14 MED ORDER — LOSARTAN POTASSIUM 50 MG PO TABS: 50.0000 mg | ORAL_TABLET | Freq: Every day | ORAL | 3 refills | Status: DC

## 2018-02-14 NOTE — Patient Instructions (Signed)
Medication Instructions:  Your physician has recommended you make the following change in your medication:  1.  INCREASE the Losartan to 50 mg daily  If you need a refill on your cardiac medications before your next appointment, please call your pharmacy.   Lab work: TODAY:  BMET  If you have labs (blood work) drawn today and your tests are completely normal, you will receive your results only by: Marland Kitchen MyChart Message (if you have MyChart) OR . A paper copy in the mail If you have any lab test that is abnormal or we need to change your treatment, we will call you to review the results.  Testing/Procedures: Your physician has recommended that you wear a ZIO event monitor for 3 days.     Follow-Up: Your physician recommends that you schedule a follow-up appointment in: [place]. Johnsie Cancel

## 2018-02-15 ENCOUNTER — Ambulatory Visit: Payer: Medicare Other | Admitting: Rheumatology

## 2018-02-17 ENCOUNTER — Ambulatory Visit (INDEPENDENT_AMBULATORY_CARE_PROVIDER_SITE_OTHER): Payer: Medicare Other

## 2018-02-17 DIAGNOSIS — R002 Palpitations: Secondary | ICD-10-CM | POA: Diagnosis not present

## 2018-02-18 ENCOUNTER — Ambulatory Visit: Payer: Medicare Other | Admitting: Rheumatology

## 2018-02-18 ENCOUNTER — Encounter: Payer: Self-pay | Admitting: Rheumatology

## 2018-02-18 VITALS — BP 131/56 | HR 62 | Resp 13 | Ht 62.0 in | Wt 161.0 lb

## 2018-02-18 DIAGNOSIS — M19041 Primary osteoarthritis, right hand: Secondary | ICD-10-CM

## 2018-02-18 DIAGNOSIS — Z840 Family history of diseases of the skin and subcutaneous tissue: Secondary | ICD-10-CM

## 2018-02-18 DIAGNOSIS — M170 Bilateral primary osteoarthritis of knee: Secondary | ICD-10-CM

## 2018-02-18 DIAGNOSIS — Z862 Personal history of diseases of the blood and blood-forming organs and certain disorders involving the immune mechanism: Secondary | ICD-10-CM

## 2018-02-18 DIAGNOSIS — R768 Other specified abnormal immunological findings in serum: Secondary | ICD-10-CM | POA: Diagnosis not present

## 2018-02-18 DIAGNOSIS — I350 Nonrheumatic aortic (valve) stenosis: Secondary | ICD-10-CM

## 2018-02-18 DIAGNOSIS — K219 Gastro-esophageal reflux disease without esophagitis: Secondary | ICD-10-CM

## 2018-02-18 DIAGNOSIS — M797 Fibromyalgia: Secondary | ICD-10-CM

## 2018-02-18 DIAGNOSIS — M5030 Other cervical disc degeneration, unspecified cervical region: Secondary | ICD-10-CM

## 2018-02-18 DIAGNOSIS — M19042 Primary osteoarthritis, left hand: Secondary | ICD-10-CM

## 2018-02-18 DIAGNOSIS — I10 Essential (primary) hypertension: Secondary | ICD-10-CM

## 2018-02-18 DIAGNOSIS — Z8679 Personal history of other diseases of the circulatory system: Secondary | ICD-10-CM

## 2018-02-18 DIAGNOSIS — M810 Age-related osteoporosis without current pathological fracture: Secondary | ICD-10-CM | POA: Diagnosis not present

## 2018-02-18 DIAGNOSIS — R7689 Other specified abnormal immunological findings in serum: Secondary | ICD-10-CM

## 2018-02-18 DIAGNOSIS — Z8719 Personal history of other diseases of the digestive system: Secondary | ICD-10-CM

## 2018-02-18 DIAGNOSIS — Z87442 Personal history of urinary calculi: Secondary | ICD-10-CM

## 2018-02-18 MED ORDER — DICLOFENAC SODIUM 1 % TD GEL: TRANSDERMAL | 3 refills | Status: DC

## 2018-02-24 DIAGNOSIS — R002 Palpitations: Secondary | ICD-10-CM | POA: Diagnosis not present

## 2018-02-27 NOTE — Progress Notes (Deleted)
Cardiology Office Note    Date:  02/27/2018   ID:  [name], DOB [birth year], MRN [id number]  PCP:  [name], DO  Cardiologist:  Dr. Ala Bent: Dr. Jeronimo [name]. [name]   Chief Complaint: BP follow up  History of Present Illness:   [name] is a 82 y.o. female with a history of HTN, HLD, fibromyalgia, GERD and severe ASs/p TAVR (10/19/17) who presents to clinic for blood pressure follow up.  She has a history of aortic stenosis that has been followed with periodic echocardiograms. Routine echocardiogram on 09/07/2017 showed progression of her aortic stenosis with a mean gradient of 40 mmHg and a peak gradient of 74 mmHg. The dimensionless index was 0.26. Left ventricular ejection fraction remained 65 to 70% with grade 1 diastolic dysfunction. She underwent cardiac catheterization by Dr. Burt Knack on 09/29/2017 and this showed widely patent coronary arteries with minor luminal irregularities. S/p successful TAVR with a54mm Edwards Sapien 3 THV via the TF approach on 10/19/17. Post operative echoshowed EF 65%, normally functioning TAVR with mean gradient 12 mmHg and trivial PVL.On ASA and plavix.   Has some white coat HTN Last visit with PA  02/14/18 Cozaar increased to 50 mg daily Complained for palpitations And event monitor reviewed from 02/17/18 benign see below   ***  Past Medical History:  Diagnosis Date  . Chronic headaches   . Fibromyalgia   . GERD (gastroesophageal reflux disease)   . Hemorrhoids    INTERNAL--  POST BANDING 02/ 2015  . History of kidney stones   . Hyperlipidemia   . Hypertension   . Moderate aortic stenosis   . OSA (obstructive sleep apnea) CPAP INTOLERANT   . Osteoporosis   . S/P TAVR (transcatheter aortic valve replacement) 10/19/2017   20 mm Edwards Sapien 3 transcatheter heart valve placed via percutaneous right transfemoral approach   . Severe aortic stenosis   . Spinal stenosis, multilevel     Past Surgical History:  Procedure  Laterality Date  . ANTERIOR CERVICAL DECOMP/DISCECTOMY FUSION  11-11-1999   C5 - C6  . APPENDECTOMY  AGE 46  . CARDIAC CATHETERIZATION  2008  DR [facility]   ESSENTIALLY NORMAL  . CATARACT EXTRACTION W/ INTRAOCULAR LENS  IMPLANT, BILATERAL  2012  . FLEXIBLE SIGMOIDOSCOPY N/A 06/01/2013   Procedure: FLEXIBLE SIGMOIDOSCOPY;  Surgeon: Megan Castle, MD;  Location: [facility];  Service: Endoscopy;  Laterality: N/A;  may need hemorrhoidal banding  . HEMORRHOIDECTOMY WITH HEMORRHOID BANDING  08-07-2010  . LAPAROSCOPIC CHOLECYSTECTOMY  1990  . RIGHT/LEFT HEART CATH AND CORONARY ANGIOGRAPHY N/A 09/29/2017   Procedure: RIGHT/LEFT HEART CATH AND CORONARY ANGIOGRAPHY;  Surgeon: Megan Mocha, MD;  Location: [place] [facility];  Service: Cardiovascular;  Laterality: N/A;  . SHOULDER ARTHROSCOPY WITH OPEN ROTATOR CUFF REPAIR AND DISTAL CLAVICLE ACROMINECTOMY Right 05/25/2012   Procedure: SHOULDER ARTHROSCOPY WITH OPEN ROTATOR CUFF REPAIR AND DISTAL CLAVICLE ACROMINECTOMY;  Surgeon: Magnus Sinning, MD;  Location: [place];  Service: Orthopedics;  Laterality: Right;  RIGHT SHOULDER ARTHROSCOPY WITH DERBRIDEMENTOF LABRAL/BICEP TENDON, OPEN DISTAL CLAVICLE RESECTION, ANTERIOR ACROMINECTOMY ROTATOR CUFF REPAIR ANESTHESIA: GENERAL/SCALENE NERVE BLOCK  . TEE WITHOUT CARDIOVERSION Bilateral 10/19/2017   Procedure: TRANSESOPHAGEAL ECHOCARDIOGRAM (TEE);  Surgeon: Megan Mocha, MD;  Location: [place];  Service: Open Heart Surgery;  Laterality: Bilateral;  . TENOSYNOVECTOMY Left 01/04/2014   Procedure: LEFT WRIST EXTENSOR TENOSYNOVECTOMY;  Surgeon: Megan Hoff, MD;  Location: [facility];  Service: Orthopedics;  Laterality: Left;  .  THYROIDECTOMY  AGE 50   GOITER  . TONSILLECTOMY AND ADENOIDECTOMY  AGE 77  . TRANSCATHETER AORTIC VALVE REPLACEMENT, TRANSFEMORAL  10/19/2017  . TRANSCATHETER AORTIC VALVE REPLACEMENT, TRANSFEMORAL Bilateral 10/19/2017   Procedure: TRANSCATHETER AORTIC  VALVE REPLACEMENT, TRANSFEMORAL;  Surgeon: Megan Mocha, MD;  Location: [place];  Service: Open Heart Surgery;  Laterality: Bilateral;  . TRANSTHORACIC ECHOCARDIOGRAM  last one 04-20-2013  DR [facility]    NORMAL LVSF/ EF 60-65%/ MODERATE  AV  STENOSIS WITH NO AR /  MILD LAE  . UMBILICAL HERNIA REPAIR  JUNE 2006  . VAGINAL HYSTERECTOMY  01-31-2001   ANTERIOR & POSTERIOR REPAIR/ TRANSVAGINAL BLADDER SLING    Current Medications: Prior to Admission medications   Medication Sig Start Date End Date Taking? Authorizing Provider  acetaminophen (TYLENOL) 500 MG tablet Take 1,000 mg by mouth daily as needed for moderate pain or headache.    [provider]  ALOE VERA PO Take 1 capsule by mouth daily.    [provider]  aspirin EC 81 MG tablet Take 1 tablet (81 mg total) by mouth daily. 01/12/18   [name], DO  BEE POLLEN PO Take 400 mg by mouth daily.     [provider]  clobetasol cream (TEMOVATE) 2.59 % Apply 1 application topically 2 (two) times daily as needed. Patient taking differently: Apply 1 application topically 2 (two) times daily as needed (for rash).  03/19/17   Megan Fraise, MD  clopidogrel (PLAVIX) 75 MG tablet Take 1 tablet (75 mg total) by mouth daily with breakfast. 01/12/18   [name], DO  Coenzyme Q10 (COQ-10) 100 MG CAPS Take 100 mg by mouth daily.    [provider]  diclofenac sodium (VOLTAREN) 1 % GEL APPLY 3 GRAMS TO 3 LARGE JOINTS UP TO 3 TIMES DIALY 12/14/17   Bo Merino, MD  losartan (COZAAR) 25 MG tablet Take 1 tablet (25 mg total) by mouth daily. 01/17/18   Megan Hector, MD  metoprolol tartrate (LOPRESSOR) 50 MG tablet Take 1 tablet (50 mg total) by mouth 2 (two) times daily. 01/17/18   Megan Hector, MD  OVER THE COUNTER MEDICATION Take 1 capsule by mouth 2 (two) times daily. Hemp Oil    [provider]  polyethylene glycol (MIRALAX / GLYCOLAX) packet Take 17 g by mouth daily as needed for moderate  constipation.    [provider]  Polyethylene Glycol 400 (BLINK TEARS) 0.25 % SOLN Place 1-2 drops into both eyes 3 (three) times daily.     [provider]  rosuvastatin (CRESTOR) 5 MG tablet Take 1 tablet (5 mg total) by mouth daily. 01/12/18   [name], DO  TURMERIC PO Take 1 capsule by mouth daily.    [provider]  vitamin B-12 (CYANOCOBALAMIN) 1000 MCG tablet Take 1,000 mcg by mouth daily.    [provider]    Allergies:   Tape and Prolia [denosumab]   Social History   Socioeconomic History  . Marital status: Married    Spouse name: Not on file  . Number of children: 3  . Years of education: Not on file  . Highest education level: Not on file  Occupational History  . Not on file  Social Needs  . Financial resource strain: Not on file  . Food insecurity:    Worry: Not on file    Inability: Not on file  . Transportation needs:    Medical: Not on file    Non-medical:  Not on file  Tobacco Use  . Smoking status: Former Smoker    Packs/day: 0.25    Years: 5.00    Pack years: 1.25    Types: Cigarettes    Last attempt to quit: 04/14/2003    Years since quitting: 14.8  . Smokeless tobacco: Never Used  Substance and Sexual Activity  . Alcohol use: Yes    Comment: once in a while-social  . Drug use: Never    Comment: hemp oil   . Sexual activity: Not Currently  Lifestyle  . Physical activity:    Days per week: Not on file    Minutes per session: Not on file  . Stress: Not on file  Relationships  . Social connections:    Talks on phone: Not on file    Gets together: Not on file    Attends religious service: Not on file    Active member of club or organization: Not on file    Attends meetings of clubs or organizations: Not on file    Relationship status: Not on file  Other Topics Concern  . Not on file  Social History Narrative  . Not on file     Family History:  The patient's family history includes Cancer (age of  onset: 45) in her son; Cirrhosis in her mother; Gallbladder disease in her maternal grandmother; Heart disease in her sister; Other in her brother; Stomach cancer in her paternal grandfather.   ROS:   Please see the history of present illness.    ROS All other systems reviewed and are negative.   PHYSICAL EXAM:   VS:  There were no vitals taken for this visit.   Affect appropriate Healthy:  appears stated age 78: normal Neck supple with no adenopathy JVP normal no bruits no thyromegaly Lungs clear with no wheezing and good diaphragmatic motion Heart:  S1/S2 SEM through TAVR valve no AR  murmur, no rub, gallop or click PMI normal Abdomen: benighn, BS positve, no tenderness, no AAA no bruit.  No HSM or HJR Distal pulses intact with no bruits No edema Neuro non-focal Skin warm and dry No muscular weakness   Wt Readings from Last 3 Encounters:  02/18/18 161 lb (73 kg)  02/14/18 161 lb 6.4 oz (73.2 kg)  01/12/18 159 lb (72.1 kg)      Studies/Labs Reviewed:   EKG:  EKG is not  ordered today.    Recent Labs: 05/24/2017: TSH 3.230 10/15/2017: ALT 18; B Natriuretic Peptide 107.7 10/20/2017: Magnesium 2.2 10/21/2017: Hemoglobin 11.2; Platelets 121 02/14/2018: BUN 15; Creatinine, Ser 0.87; Potassium 5.1; Sodium 140   Lipid Panel    Component Value Date/Time   CHOL 145 05/25/2017 0844   CHOL 219 (H) 09/07/2012 0935   TRIG 136 05/25/2017 0844   TRIG 180 (H) 02/17/2013 1007   TRIG 326 (H) 09/07/2012 0935   HDL 47 05/25/2017 0844   HDL 51 02/17/2013 1007   HDL 47 09/07/2012 0935   CHOLHDL 3.1 05/25/2017 0844   CHOLHDL 5.2 10/09/2006 0435   VLDL 58 (H) 10/09/2006 0435   LDLCALC 71 05/25/2017 0844   LDLCALC 89 02/17/2013 1007   LDLCALC 107 (H) 09/07/2012 0935    Additional studies/ records that were reviewed today include:   Event Monitor 02/17/18 Study Highlights   NSR average HR 64 bpm (range 47-114) One episode atrial tachycardia 6 beats PAC's/ PVC;s less than 1%  beats No PAF No significant arrhythmias     2D ECHO 11/25/17 ( 1 month  s/p TAVR)  Study Conclusion - Left ventricle: The cavity size was normal. There was mild focal basal hypertrophy of the septum. Systolic function was normal. The estimated ejection fraction was in the range of 60% to 65%. Wall motion was normal; there were no regional wall motion abnormalities. Doppler parameters are consistent with abnormal left ventricular relaxation (grade 1 diastolic dysfunction). - Aortic valve: A TAVR bioprosthesis was present and functioning normally. There was mild perivalvular regurgitation. Peak velocity (S): 244 cm/s. Mean gradient (S): 12 mm Hg. - Mitral valve: Severely calcified annulus. - Left atrium: The atrium was mildly dilated Impressions - Compared to the prior study, there has been no significant interval change.   RIGHT/LEFT HEART CATH AND CORONARY ANGIOGRAPHY  09/29/17  Conclusion   1.  Known severe aortic stenosis by noninvasive assessment with heavily calcified and restricted aortic valve leaflets biplane fluoroscopy 2.  Heavy mitral annular calcium 3.  Widely patent coronary arteries with minor luminal irregularities 4.  Mildly elevated right heart pressures   Recommendation: Continued multidisciplinary heart team evaluation for treatment of severe aortic stenosis     ASSESSMENT & PLAN:    1. HTN -component of white coat HTN, 02/14/18 Cozaar increased to 50 mg daily by PA.  ***  2. Severe AS s/p TVAR - No dyspnea. Continue ASA and plavix. TTE reviewed 11/25/17 mild peri valvular regurgitation mean gradient 12 mmHg SBE prophylaxis Discussed   3. Palpitations -  Monitor from 02/17/18 reviewed NSR average HR 64 PAC;s/ PVC;s less than 1% no significant arrhythmias observe   Medication Adjustments/Labs and Tests Ordered: Current medicines are reviewed at length with the patient today.  Concerns regarding medicines are outlined above.  Medication  changes, Labs and Tests ordered today are listed in the Patient Instructions below. There are no Patient Instructions on file for this visit.   Signed, Jenkins Rouge, MD  02/27/2018 4:08 PM    [facility] [address], [place], [place]  [address] Phone: [phone]; Fax: [phone]

## 2018-03-03 ENCOUNTER — Ambulatory Visit: Payer: Medicare Other | Admitting: Cardiovascular Disease

## 2018-03-08 ENCOUNTER — Telehealth: Payer: Self-pay | Admitting: *Deleted

## 2018-03-08 NOTE — Telephone Encounter (Signed)
Pt declines flu shot.  

## 2018-05-02 ENCOUNTER — Encounter: Payer: Self-pay | Admitting: Physician Assistant

## 2018-05-02 ENCOUNTER — Ambulatory Visit (INDEPENDENT_AMBULATORY_CARE_PROVIDER_SITE_OTHER): Payer: Medicare Other

## 2018-05-02 ENCOUNTER — Ambulatory Visit (INDEPENDENT_AMBULATORY_CARE_PROVIDER_SITE_OTHER): Payer: Medicare Other | Admitting: Physician Assistant

## 2018-05-02 VITALS — BP 159/51 | HR 55 | Temp 97.9°F | Ht 62.0 in | Wt 162.8 lb

## 2018-05-02 DIAGNOSIS — S63692A Other sprain of right middle finger, initial encounter: Secondary | ICD-10-CM | POA: Diagnosis not present

## 2018-05-02 DIAGNOSIS — M79641 Pain in right hand: Secondary | ICD-10-CM

## 2018-05-04 NOTE — Progress Notes (Signed)
BP (!) 159/51   Pulse (!) 55   Temp 97.9 F (36.6 C) (Oral)   Ht 5\' 2"  (1.575 m)   Wt 162 lb 12.8 oz (73.8 kg)   BMI 29.78 kg/m    Subjective:    Patient ID: [name], female    DOB: 12/21/1935, 83 y.o.   MRN: [id number]  HPI: [name] is a 83 y.o. female presenting on 05/02/2018 for Hand Pain (and swelling right )  This patient comes in having right hand swelling and a significant change in the past 2 days.  She has known severe osteoarthritis in many joints.  She was just concerned about something being broken.  She did have an episode where she pulled her middle finger up with extension and had some pain but she thinks the swelling may have come from this.  Past Medical History:  Diagnosis Date  . Chronic headaches   . Fibromyalgia   . GERD (gastroesophageal reflux disease)   . Hemorrhoids    INTERNAL--  POST BANDING 02/ 2015  . History of kidney stones   . Hyperlipidemia   . Hypertension   . Moderate aortic stenosis   . OSA (obstructive sleep apnea) CPAP INTOLERANT   . Osteoporosis   . S/P TAVR (transcatheter aortic valve replacement) 10/19/2017   20 mm Edwards Sapien 3 transcatheter heart valve placed via percutaneous right transfemoral approach   . Severe aortic stenosis   . Spinal stenosis, multilevel    Relevant past medical, surgical, family and social history reviewed and updated as indicated. Interim medical history since our last visit reviewed. Allergies and medications reviewed and updated. DATA REVIEWED: CHART IN EPIC  Family History reviewed for pertinent findings.  Review of Systems  Constitutional: Negative.   HENT: Negative.   Eyes: Negative.   Respiratory: Negative.   Gastrointestinal: Negative.   Genitourinary: Negative.   Musculoskeletal: Positive for arthralgias and joint swelling.    Allergies as of 05/02/2018      Reactions   Tape Other (See Comments)   Dermatitis rash "with extended exposure"   Prolia [denosumab] Other (See  Comments)   Arthralgia/ myalgia/ jaw pain/ headache      Medication List       Accurate as of May 02, 2018 11:59 PM. Always use your most recent med list.        acetaminophen 500 MG tablet Commonly known as:  TYLENOL Take 1,000 mg by mouth daily as needed for moderate pain or headache.   ALOE VERA PO Take 1 capsule by mouth daily.   aspirin EC 81 MG tablet Take 1 tablet (81 mg total) by mouth daily.   BEE POLLEN PO Take 400 mg by mouth daily.   BLINK TEARS 0.25 % Soln Generic drug:  Polyethylene Glycol 400 Place 1-2 drops into both eyes 3 (three) times daily.   clobetasol cream 0.05 % Commonly known as:  TEMOVATE Apply 1 application topically 2 (two) times daily as needed.   clopidogrel 75 MG tablet Commonly known as:  PLAVIX Take 1 tablet (75 mg total) by mouth daily with breakfast.   CoQ-10 100 MG Caps Take 100 mg by mouth daily.   diclofenac sodium 1 % Gel Commonly known as:  VOLTAREN APPLY 3 GRAMS TO 3 LARGE JOINTS UP TO 3 TIMES DAILY   losartan 50 MG tablet Commonly known as:  COZAAR Take 1 tablet (50 mg total) by mouth daily.   metoprolol tartrate 50 MG tablet Commonly known  as:  LOPRESSOR Take 1 tablet (50 mg total) by mouth 2 (two) times daily.   OVER THE COUNTER MEDICATION Take 1 capsule by mouth 2 (two) times daily. Hemp Oil   polyethylene glycol packet Commonly known as:  MIRALAX / GLYCOLAX Take 17 g by mouth daily as needed for moderate constipation.   rosuvastatin 5 MG tablet Commonly known as:  CRESTOR Take 1 tablet (5 mg total) by mouth daily.   TURMERIC PO Take 1 capsule by mouth daily.   vitamin B-12 1000 MCG tablet Commonly known as:  CYANOCOBALAMIN Take 1,000 mcg by mouth daily.          Objective:    BP (!) 159/51   Pulse (!) 55   Temp 97.9 F (36.6 C) (Oral)   Ht 5\' 2"  (1.575 m)   Wt 162 lb 12.8 oz (73.8 kg)   BMI 29.78 kg/m   Allergies  Allergen Reactions  . Tape Other (See Comments)    Dermatitis rash  "with extended exposure"  . Prolia [Denosumab] Other (See Comments)    Arthralgia/ myalgia/ jaw pain/ headache    Wt Readings from Last 3 Encounters:  05/02/18 162 lb 12.8 oz (73.8 kg)  02/18/18 161 lb (73 kg)  02/14/18 161 lb 6.4 oz (73.2 kg)    Physical Exam Constitutional:      Appearance: She is well-developed.  HENT:     Head: Normocephalic and atraumatic.  Eyes:     Conjunctiva/sclera: Conjunctivae normal.     Pupils: Pupils are equal, round, and reactive to light.  Cardiovascular:     Rate and Rhythm: Normal rate and regular rhythm.     Heart sounds: Normal heart sounds.  Pulmonary:     Effort: Pulmonary effort is normal.     Breath sounds: Normal breath sounds.  Abdominal:     General: Bowel sounds are normal.     Palpations: Abdomen is soft.  Musculoskeletal:     Right hand: She exhibits decreased range of motion, tenderness and swelling.       Hands:  Skin:    General: Skin is warm and dry.     Findings: No rash.  Neurological:     Mental Status: She is alert and oriented to person, place, and time.     Deep Tendon Reflexes: Reflexes are normal and symmetric.  Psychiatric:        Behavior: Behavior normal.        Thought Content: Thought content normal.        Judgment: Judgment normal.     Results for orders placed or performed in visit on 28/31/51  Basic metabolic panel  Result Value Ref Range   Glucose 98 65 - 99 mg/dL   BUN 15 8 - 27 mg/dL   Creatinine, Ser 0.87 0.57 - 1.00 mg/dL   GFR calc non Af Amer 63 >59 mL/min/1.73   GFR calc Af Amer 73 >59 mL/min/1.73   BUN/Creatinine Ratio 17 12 - 28   Sodium 140 134 - 144 mmol/L   Potassium 5.1 3.5 - 5.2 mmol/L   Chloride 100 96 - 106 mmol/L   CO2 26 20 - 29 mmol/L   Calcium 10.1 8.7 - 10.3 mg/dL      Assessment & Plan:   1. Pain of right hand - DG Hand Complete Right; Future  2. Sprain of other site of right middle finger, initial encounter Continue diclofenac topically   Continue all other  maintenance medications as listed above.  Follow up  plan: No follow-ups on file.  Educational handout given for [place] PA-C [place] [address]  [place], [place] [address] [phone]   05/04/2018, 1:45 PM

## 2018-05-19 NOTE — Progress Notes (Signed)
Cardiology Office Note    Date:  05/26/2018   ID:  [name], DOB 05-Oct-1935, MRN [id number]  PCP:  Megan Norlander, DO  Cardiologist:  Dr. Ala Bent: Dr. Jeronimo Greaves. [name]   Chief Complaint: BP follow up  History of Present Illness:   [name] is a 83 y.o. female with a history of HTN, HLD, fibromyalgia, GERD and severe ASs/p TAVR (10/19/17) who presents to clinic for  follow up.TAVR 10/19/17 with 20 mm Sapien no CAD at cath post implant mean gradient 12 mmHg despite Small size of valve. Mild PVL BP improved with cozaar   No dyspnea palpitations or chest pain Told her she could stop her Plavix No CAD at cath  Needs f/u TTE in July   Past Medical History:  Diagnosis Date  . Chronic headaches   . Fibromyalgia   . GERD (gastroesophageal reflux disease)   . Hemorrhoids    INTERNAL--  POST BANDING 02/ 2015  . History of kidney stones   . Hyperlipidemia   . Hypertension   . Moderate aortic stenosis   . OSA (obstructive sleep apnea) CPAP INTOLERANT   . Osteoporosis   . S/P TAVR (transcatheter aortic valve replacement) 10/19/2017   20 mm Edwards Sapien 3 transcatheter heart valve placed via percutaneous right transfemoral approach   . Severe aortic stenosis   . Spinal stenosis, multilevel     Past Surgical History:  Procedure Laterality Date  . ANTERIOR CERVICAL DECOMP/DISCECTOMY FUSION  11-11-1999   C5 - C6  . APPENDECTOMY  AGE 58  . CARDIAC CATHETERIZATION  2008  DR [facility]   ESSENTIALLY NORMAL  . CATARACT EXTRACTION W/ INTRAOCULAR LENS  IMPLANT, BILATERAL  2012  . FLEXIBLE SIGMOIDOSCOPY N/A 06/01/2013   Procedure: FLEXIBLE SIGMOIDOSCOPY;  Surgeon: Megan Castle, MD;  Location: [facility];  Service: Endoscopy;  Laterality: N/A;  may need hemorrhoidal banding  . HEMORRHOIDECTOMY WITH HEMORRHOID BANDING  08-07-2010  . LAPAROSCOPIC CHOLECYSTECTOMY  1990  . RIGHT/LEFT HEART CATH AND CORONARY ANGIOGRAPHY N/A 09/29/2017   Procedure: RIGHT/LEFT HEART CATH AND  CORONARY ANGIOGRAPHY;  Surgeon: Megan Mocha, MD;  Location: [facility];  Service: Cardiovascular;  Laterality: N/A;  . SHOULDER ARTHROSCOPY WITH OPEN ROTATOR CUFF REPAIR AND DISTAL CLAVICLE ACROMINECTOMY Right 05/25/2012   Procedure: SHOULDER ARTHROSCOPY WITH OPEN ROTATOR CUFF REPAIR AND DISTAL CLAVICLE ACROMINECTOMY;  Surgeon: Magnus Sinning, MD;  Location: [facility];  Service: Orthopedics;  Laterality: Right;  RIGHT SHOULDER ARTHROSCOPY WITH DERBRIDEMENTOF LABRAL/BICEP TENDON, OPEN DISTAL CLAVICLE RESECTION, ANTERIOR ACROMINECTOMY ROTATOR CUFF REPAIR ANESTHESIA: GENERAL/SCALENE NERVE BLOCK  . TEE WITHOUT CARDIOVERSION Bilateral 10/19/2017   Procedure: TRANSESOPHAGEAL ECHOCARDIOGRAM (TEE);  Surgeon: Megan Mocha, MD;  Location: [facility] [facility];  Service: Open Heart Surgery;  Laterality: Bilateral;  . TENOSYNOVECTOMY Left 01/04/2014   Procedure: LEFT WRIST EXTENSOR TENOSYNOVECTOMY;  Surgeon: Megan Hoff, MD;  Location: [facility];  Service: Orthopedics;  Laterality: Left;  . THYROIDECTOMY  AGE 58   GOITER  . TONSILLECTOMY AND ADENOIDECTOMY  AGE 50  . TRANSCATHETER AORTIC VALVE REPLACEMENT, TRANSFEMORAL  10/19/2017  . TRANSCATHETER AORTIC VALVE REPLACEMENT, TRANSFEMORAL Bilateral 10/19/2017   Procedure: TRANSCATHETER AORTIC VALVE REPLACEMENT, TRANSFEMORAL;  Surgeon: Megan Mocha, MD;  Location: [place];  Service: Open Heart Surgery;  Laterality: Bilateral;  . TRANSTHORACIC ECHOCARDIOGRAM  last one 04-20-2013  DR [name]    NORMAL LVSF/ EF 60-65%/ MODERATE  AV  STENOSIS WITH NO AR /  MILD LAE  . UMBILICAL HERNIA REPAIR  JUNE 2006  . VAGINAL HYSTERECTOMY  01-31-2001   ANTERIOR & POSTERIOR REPAIR/ TRANSVAGINAL BLADDER SLING    Current Medications: Prior to Admission medications   Medication Sig Start Date End Date Taking? Authorizing Provider  acetaminophen (TYLENOL) 500 MG tablet Take 1,000 mg by mouth daily as needed for moderate pain or headache.     [provider]  ALOE VERA PO Take 1 capsule by mouth daily.    [provider]  aspirin EC 81 MG tablet Take 1 tablet (81 mg total) by mouth daily. 01/12/18   [name] M, DO  BEE POLLEN PO Take 400 mg by mouth daily.     [provider]  clobetasol cream (TEMOVATE) 9.37 % Apply 1 application topically 2 (two) times daily as needed. Patient taking differently: Apply 1 application topically 2 (two) times daily as needed (for rash).  03/19/17   Megan Fraise, MD  clopidogrel (PLAVIX) 75 MG tablet Take 1 tablet (75 mg total) by mouth daily with breakfast. 01/12/18   [name] M, DO  Coenzyme Q10 (COQ-10) 100 MG CAPS Take 100 mg by mouth daily.    [provider]  diclofenac sodium (VOLTAREN) 1 % GEL APPLY 3 GRAMS TO 3 LARGE JOINTS UP TO 3 TIMES DIALY 12/14/17   Megan Merino, MD  losartan (COZAAR) 25 MG tablet Take 1 tablet (25 mg total) by mouth daily. 01/17/18   Megan Hector, MD  metoprolol tartrate (LOPRESSOR) 50 MG tablet Take 1 tablet (50 mg total) by mouth 2 (two) times daily. 01/17/18   Megan Hector, MD  OVER THE COUNTER MEDICATION Take 1 capsule by mouth 2 (two) times daily. Hemp Oil    [provider]  polyethylene glycol (MIRALAX / GLYCOLAX) packet Take 17 g by mouth daily as needed for moderate constipation.    [provider]  Polyethylene Glycol 400 (BLINK TEARS) 0.25 % SOLN Place 1-2 drops into both eyes 3 (three) times daily.     [provider]  rosuvastatin (CRESTOR) 5 MG tablet Take 1 tablet (5 mg total) by mouth daily. 01/12/18   Megan Norlander, DO  TURMERIC PO Take 1 capsule by mouth daily.    [provider]  vitamin B-12 (CYANOCOBALAMIN) 1000 MCG tablet Take 1,000 mcg by mouth daily.    [provider]    Allergies:   Tape and Prolia [denosumab]   Social History   Socioeconomic History  . Marital status: Married    Spouse name: Not on file  . Number of children: 3  .  Years of education: Not on file  . Highest education level: Not on file  Occupational History  . Not on file  Social Needs  . Financial resource strain: Not on file  . Food insecurity:    Worry: Not on file    Inability: Not on file  . Transportation needs:    Medical: Not on file    Non-medical: Not on file  Tobacco Use  . Smoking status: Former Smoker    Packs/day: 0.25    Years: 5.00    Pack years: 1.25    Types: Cigarettes    Last attempt to quit: 04/14/2003    Years since quitting: 15.1  . Smokeless tobacco: Never Used  Substance and Sexual Activity  . Alcohol use: Yes    Comment: once in a while-social  . Drug use: Never    Comment: hemp oil   . Sexual activity: Not Currently  Lifestyle  .  Physical activity:    Days per week: Not on file    Minutes per session: Not on file  . Stress: Not on file  Relationships  . Social connections:    Talks on phone: Not on file    Gets together: Not on file    Attends religious service: Not on file    Active member of club or organization: Not on file    Attends meetings of clubs or organizations: Not on file    Relationship status: Not on file  Other Topics Concern  . Not on file  Social History Narrative  . Not on file     Family History:  The patient's family history includes Cancer (age of onset: 48) in her son; Cirrhosis in her mother; Gallbladder disease in her maternal grandmother; Heart disease in her sister; Other in her brother; Stomach cancer in her paternal grandfather.   ROS:   Please see the history of present illness.    ROS All other systems reviewed and are negative.   PHYSICAL EXAM:   VS:  BP 130/66   Pulse 76   Ht 5\' 2"  (1.575 m)   Wt 160 lb 6.4 oz (72.8 kg)   SpO2 96%   BMI 29.34 kg/m    Affect appropriate Healthy:  appears stated age 21: normal Neck supple with no adenopathy JVP normal no bruits no thyromegaly Lungs clear with no wheezing and good diaphragmatic motion Heart:  S1/S2 sEM  through TAVR  Loud AR  murmur, no rub, gallop or click PMI normal Abdomen: benighn, BS positve, no tenderness, no AAA no bruit.  No HSM or HJR Distal pulses intact with no bruits No edema Neuro non-focal Skin warm and dry No muscular weakness   Wt Readings from Last 3 Encounters:  05/26/18 160 lb 6.4 oz (72.8 kg)  05/02/18 162 lb 12.8 oz (73.8 kg)  02/18/18 161 lb (73 kg)      Studies/Labs Reviewed:   EKG:  EKG is not  ordered today.    Recent Labs: 10/15/2017: ALT 18; B Natriuretic Peptide 107.7 10/20/2017: Magnesium 2.2 10/21/2017: Hemoglobin 11.2; Platelets 121 02/14/2018: BUN 15; Creatinine, Ser 0.87; Potassium 5.1; Sodium 140   Lipid Panel    Component Value Date/Time   CHOL 145 05/25/2017 0844   CHOL 219 (H) 09/07/2012 0935   TRIG 136 05/25/2017 0844   TRIG 180 (H) 02/17/2013 1007   TRIG 326 (H) 09/07/2012 0935   HDL 47 05/25/2017 0844   HDL 51 02/17/2013 1007   HDL 47 09/07/2012 0935   CHOLHDL 3.1 05/25/2017 0844   CHOLHDL 5.2 10/09/2006 0435   VLDL 58 (H) 10/09/2006 0435   LDLCALC 71 05/25/2017 0844   LDLCALC 89 02/17/2013 1007   LDLCALC 107 (H) 09/07/2012 0935    Additional studies/ records that were reviewed today include:    2D ECHO 11/25/17 ( 1 month s/p TAVR)  Study Conclusion - Left ventricle: The cavity size was normal. There was mild focal basal hypertrophy of the septum. Systolic function was normal. The estimated ejection fraction was in the range of 60% to 65%. Wall motion was normal; there were no regional wall motion abnormalities. Doppler parameters are consistent with abnormal left ventricular relaxation (grade 1 diastolic dysfunction). - Aortic valve: A TAVR bioprosthesis was present and functioning normally. There was mild perivalvular regurgitation. Peak velocity (S): 244 cm/s. Mean gradient (S): 12 mm Hg. - Mitral valve: Severely calcified annulus. - Left atrium: The atrium was mildly dilated Impressions - Compared  to  the prior study, there has been no significant interval change.   RIGHT/LEFT HEART CATH AND CORONARY ANGIOGRAPHY  09/29/17  Conclusion   1.  Known severe aortic stenosis by noninvasive assessment with heavily calcified and restricted aortic valve leaflets biplane fluoroscopy 2.  Heavy mitral annular calcium 3.  Widely patent coronary arteries with minor luminal irregularities 4.  Mildly elevated right heart pressures   Recommendation: Continued multidisciplinary heart team evaluation for treatment of severe aortic stenosis     ASSESSMENT & PLAN:    1. HTN -improved on higher dose ARB some white coat component and anxiety  2. Severe AS s/p TVAR - No dyspnea. D/c Plavix  She has a loud AR murmur on exam today will have f/u  TTE July no need to do sooner since asymptomatic TTE 11/25/17 only showed mild  PVL with mean gradient 12 mmHg which is low for her 20 mm valve   3. Palpitations -  Reviewed Zio monitor done 02/2018 no significant arrhythmia continue beta blocker    Medication Adjustments/Labs and Tests Ordered: Current medicines are reviewed at length with the patient today.  Concerns regarding medicines are outlined above.  Medication changes, Labs and Tests ordered today are listed in the Patient Instructions below. There are no Patient Instructions on file for this visit.   Signed, Jenkins Rouge, MD  05/26/2018 10:40 AM    [address], [place], [place]  [address] Phone: [phone]; Fax: [phone]

## 2018-05-26 ENCOUNTER — Encounter: Payer: Self-pay | Admitting: Cardiovascular Disease

## 2018-05-26 ENCOUNTER — Ambulatory Visit: Payer: Medicare Other | Admitting: Cardiovascular Disease

## 2018-05-26 VITALS — BP 130/66 | HR 76 | Ht 62.0 in | Wt 160.4 lb

## 2018-05-26 DIAGNOSIS — Z952 Presence of prosthetic heart valve: Secondary | ICD-10-CM

## 2018-05-26 DIAGNOSIS — I10 Essential (primary) hypertension: Secondary | ICD-10-CM

## 2018-05-26 DIAGNOSIS — R002 Palpitations: Secondary | ICD-10-CM | POA: Diagnosis not present

## 2018-05-26 DIAGNOSIS — I350 Nonrheumatic aortic (valve) stenosis: Secondary | ICD-10-CM

## 2018-05-26 NOTE — Patient Instructions (Addendum)
Medication Instructions:   If you need a refill on your cardiac medications before your next appointment, please call your pharmacy.   Lab work:  If you have labs (blood work) drawn today and your tests are completely normal, you will receive your results only by: Marland Kitchen MyChart Message (if you have MyChart) OR . A paper copy in the mail If you have any lab test that is abnormal or we need to change your treatment, we will call you to review the results.  Testing/Procedures: Your physician has requested that you have an echocardiogram in July. Echocardiography is a painless test that uses sound waves to create images of your heart. It provides your doctor with information about the size and shape of your heart and how well your heart's chambers and valves are working. This procedure takes approximately one hour. There are no restrictions for this procedure.  Follow-Up: At [facility], you and your health needs are our priority.  As part of our continuing mission to provide you with exceptional heart care, we have created designated Provider Care Teams.  These Care Teams include your primary Cardiologist (physician) and Advanced Practice Providers (APPs -  Physician Assistants and Nurse Practitioners) who all work together to provide you with the care you need, when you need it.  Your physician recommends that you schedule a follow-up appointment in: July with Dr. Burt Knack or Nell Range PA.   You will need a follow up appointment in 12 months.  Please call our office 2 months in advance to schedule this appointment.  You may see Jenkins Rouge, MD or one of the following Advanced Practice Providers on your designated Care Team:   Truitt Merle, NP Cecilie Kicks, NP . Kathyrn Drown, NP

## 2018-07-01 ENCOUNTER — Other Ambulatory Visit: Payer: Self-pay | Admitting: Family Medicine

## 2018-07-17 DIAGNOSIS — S93401A Sprain of unspecified ligament of right ankle, initial encounter: Secondary | ICD-10-CM | POA: Diagnosis not present

## 2018-07-17 DIAGNOSIS — S0003XA Contusion of scalp, initial encounter: Secondary | ICD-10-CM | POA: Diagnosis not present

## 2018-07-17 DIAGNOSIS — Z23 Encounter for immunization: Secondary | ICD-10-CM | POA: Diagnosis not present

## 2018-07-17 DIAGNOSIS — Z79899 Other long term (current) drug therapy: Secondary | ICD-10-CM | POA: Diagnosis not present

## 2018-07-17 DIAGNOSIS — W010XXA Fall on same level from slipping, tripping and stumbling without subsequent striking against object, initial encounter: Secondary | ICD-10-CM | POA: Diagnosis not present

## 2018-07-17 DIAGNOSIS — Z7982 Long term (current) use of aspirin: Secondary | ICD-10-CM | POA: Diagnosis not present

## 2018-07-17 DIAGNOSIS — R2241 Localized swelling, mass and lump, right lower limb: Secondary | ICD-10-CM | POA: Diagnosis not present

## 2018-07-17 DIAGNOSIS — M79671 Pain in right foot: Secondary | ICD-10-CM | POA: Diagnosis not present

## 2018-08-01 ENCOUNTER — Other Ambulatory Visit: Payer: Self-pay | Admitting: Family Medicine

## 2018-08-01 MED ORDER — ROSUVASTATIN CALCIUM 5 MG PO TABS: 5.0000 mg | ORAL_TABLET | Freq: Every day | ORAL | 1 refills | Status: DC

## 2018-08-18 ENCOUNTER — Telehealth: Payer: Self-pay | Admitting: Physician Assistant

## 2018-08-18 NOTE — Telephone Encounter (Signed)
Patient set up for MyChart?   Is patient using Smartphone/computer/tablet? No use home phone  Did audio/video work?  Does patient need telephone visit? Yes   Best phone number to use? [phone]  Special Instructions? Patient will have vitals when nurse calls      Virtual Visit Pre-Appointment Phone Call  "(Name), I am calling you today to discuss your upcoming appointment. We are currently trying to limit exposure to the virus that causes COVID-19 by seeing patients at home rather than in the office."  1. "What is the BEST phone number to call the day of the visit?" - include this in appointment notes  2. Do you have or have access to (through a family member/friend) a smartphone with video capability that we can use for your visit?" a. If yes - list this number in appt notes as cell (if different from BEST phone #) and list the appointment type as a VIDEO visit in appointment notes b. If no - list the appointment type as a PHONE visit in appointment notes  3. Confirm consent - "In the setting of the current Covid19 crisis, you are scheduled for a (phone or video) visit with your provider on (date) at (time).  Just as we do with many in-office visits, in order for you to participate in this visit, we must obtain consent.  If you'd like, I can send this to your mychart (if signed up) or email for you to review.  Otherwise, I can obtain your verbal consent now.  All virtual visits are billed to your insurance company just like a normal visit would be.  By agreeing to a virtual visit, we'd like you to understand that the technology does not allow for your provider to perform an examination, and thus may limit your provider's ability to fully assess your condition. If your provider identifies any concerns that need to be evaluated in person, we will make arrangements to do so.  Finally, though the technology is pretty good, we cannot assure that it will always work on either your or our  end, and in the setting of a video visit, we may have to convert it to a phone-only visit.  In either situation, we cannot ensure that we have a secure connection.  Are you willing to proceed?" STAFF: Did the patient verbally acknowledge consent to telehealth visit? Document YES/NO here: yes  4. Advise patient to be prepared - "Two hours prior to your appointment, go ahead and check your blood pressure, pulse, oxygen saturation, and your weight (if you have the equipment to check those) and write them all down. When your visit starts, your provider will ask you for this information. If you have an Apple Watch or Kardia device, please plan to have heart rate information ready on the day of your appointment. Please have a pen and paper handy nearby the day of the visit as well."  5. Give patient instructions for MyChart download to smartphone OR [email or web address] as below if video visit (depending on what platform provider is using)  6. Inform patient they will receive a phone call 15 minutes prior to their appointment time (may be from unknown caller ID) so they should be prepared to answer    [place] has been deemed a candidate for a follow-up tele-health visit to limit community exposure during the Covid-19 pandemic. I spoke with the patient via phone to ensure availability of phone/video source, confirm preferred email & phone number, and  discuss instructions and expectations.  I reminded [name] to be prepared with any vital sign and/or heart rhythm information that could potentially be obtained via home monitoring, at the time of her visit. I reminded MAHKAYLA PREECE to expect a phone call prior to her visit.  Howie Ill 08/18/2018 4:41 PM   INSTRUCTIONS FOR DOWNLOADING THE MYCHART APP TO SMARTPHONE  - The patient must first make sure to have activated MyChart and know their login information - If Apple, go to [employer] and type in MyChart in the search bar and  download the app. If Android, ask patient to go to [employer] and type in [place] in the search bar and download the app. The app is free but as with any other app downloads, their phone may require them to verify saved payment information or Apple/Android password.  - The patient will need to then log into the app with their MyChart username and password, and select [facility] as their healthcare provider to link the account. When it is time for your visit, go to the MyChart app, find appointments, and click Begin Video Visit. Be sure to Select Allow for your device to access the Microphone and Camera for your visit. You will then be connected, and your provider will be with you shortly.  **If they have any issues connecting, or need assistance please contact MyChart service desk [phone])**  **If using a computer, in order to ensure the best quality for their visit they will need to use either of the following Internet Browsers: [employer], or Google Chrome**  IF USING DOXIMITY or DOXY.ME - The patient will receive a link just prior to their visit by text.     FULL LENGTH CONSENT FOR TELE-HEALTH VISIT   I hereby voluntarily request, consent and authorize [facility] and its employed or contracted physicians, physician assistants, nurse practitioners or other licensed health care professionals (the Practitioner), to provide me with telemedicine health care services (the Services") as deemed necessary by the treating Practitioner. I acknowledge and consent to receive the Services by the Practitioner via telemedicine. I understand that the telemedicine visit will involve communicating with the Practitioner through live audiovisual communication technology and the disclosure of certain medical information by electronic transmission. I acknowledge that I have been given the opportunity to request an in-person assessment or other available alternative prior to the  telemedicine visit and am voluntarily participating in the telemedicine visit.  I understand that I have the right to withhold or withdraw my consent to the use of telemedicine in the course of my care at any time, without affecting my right to future care or treatment, and that the Practitioner or I may terminate the telemedicine visit at any time. I understand that I have the right to inspect all information obtained and/or recorded in the course of the telemedicine visit and may receive copies of available information for a reasonable fee.  I understand that some of the potential risks of receiving the Services via telemedicine include:   Delay or interruption in medical evaluation due to technological equipment failure or disruption;  Information transmitted may not be sufficient (e.g. poor resolution of images) to allow for appropriate medical decision making by the Practitioner; and/or   In rare instances, security protocols could fail, causing a breach of personal health information.  Furthermore, I acknowledge that it is my responsibility to provide information about my medical history, conditions and care that is complete and  accurate to the best of my ability. I acknowledge that Practitioner's advice, recommendations, and/or decision may be based on factors not within their control, such as incomplete or inaccurate data provided by me or distortions of diagnostic images or specimens that may result from electronic transmissions. I understand that the practice of medicine is not an exact science and that Practitioner makes no warranties or guarantees regarding treatment outcomes. I acknowledge that I will receive a copy of this consent concurrently upon execution via email to the email address I last provided but may also request a printed copy by calling the office of [facility].    I understand that my insurance will be billed for this visit.   I have read or had this consent read to  me.  I understand the contents of this consent, which adequately explains the benefits and risks of the Services being provided via telemedicine.   I have been provided ample opportunity to ask questions regarding this consent and the Services and have had my questions answered to my satisfaction.  I give my informed consent for the services to be provided through the use of telemedicine in my medical care  By participating in this telemedicine visit I agree to the above.

## 2018-08-25 ENCOUNTER — Telehealth: Payer: Medicare Other | Admitting: Physician Assistant

## 2018-09-06 ENCOUNTER — Telehealth (HOSPITAL_COMMUNITY): Payer: Self-pay | Admitting: Radiology

## 2018-09-06 NOTE — Telephone Encounter (Signed)

## 2018-09-07 ENCOUNTER — Ambulatory Visit (HOSPITAL_COMMUNITY): Payer: Medicare Other | Attending: Internal Medicine

## 2018-09-07 ENCOUNTER — Other Ambulatory Visit: Payer: Self-pay

## 2018-09-07 DIAGNOSIS — Z952 Presence of prosthetic heart valve: Secondary | ICD-10-CM | POA: Diagnosis not present

## 2018-09-07 DIAGNOSIS — I350 Nonrheumatic aortic (valve) stenosis: Secondary | ICD-10-CM | POA: Insufficient documentation

## 2018-09-08 ENCOUNTER — Telehealth: Payer: Self-pay

## 2018-09-08 ENCOUNTER — Encounter: Payer: Medicare Other | Admitting: Physician Assistant

## 2018-09-08 DIAGNOSIS — Z952 Presence of prosthetic heart valve: Secondary | ICD-10-CM

## 2018-09-08 DIAGNOSIS — I359 Nonrheumatic aortic valve disorder, unspecified: Secondary | ICD-10-CM

## 2018-09-08 NOTE — Telephone Encounter (Signed)
Called patient with results. Per Dr. Johnsie Cancel, EF normal gradients stable on TAVR valve some leak but ok f/u echo in 6 months. Patient has an appointment with Nell Range PA this afternoon. Informed patient that she would go into more detail about her echo results. Patient verbalized understanding. Placed order for echo. Will send message to scheduling to help arrange.

## 2018-09-08 NOTE — Telephone Encounter (Signed)
-----   Message from [name], MD sent at 09/08/2018  9:24 AM EDT ----- EF normal gradients stable on TAVR valve some leak but ok f/u echo in 6 months

## 2018-09-08 NOTE — Progress Notes (Signed)
This encounter was created in error - please disregard.

## 2018-09-12 NOTE — Progress Notes (Signed)
[facility]   MULTIDISCIPLINARY HEART VALVE TEAM  Virtual Visit via Telephone Note   This visit type was conducted due to national recommendations for restrictions regarding the COVID-19 Pandemic (e.g. social distancing) in an effort to limit this patient's exposure and mitigate transmission in our community.  Due to her co-morbid illnesses, this patient is at least at moderate risk for complications without adequate follow up.  This format is felt to be most appropriate for this patient at this time.  The patient did not have access to video technology/had technical difficulties with video requiring transitioning to audio format only (telephone).  All issues noted in this document were discussed and addressed.  No physical exam could be performed with this format.  Please refer to the patient's chart for her  consent to telehealth for [facility].   Evaluation Performed:  Follow-up visit  Date:  09/14/2018   ID:  [name], DOB 1936-01-27, MRN [id number]  Patient Location: Home Provider Location: Office  PCP:  Megan Norlander, DO  Cardiologist:  Jenkins Rouge, MD / Dr. Burt Knack & Dr. Roxy Manns (TAVR) Electrophysiologist:  None   Chief Complaint:  1 year s/p TAVR  History of Present Illness:    [name] is a 83 y.o. female with a history of HTN, HLD, fibromyalgia, GERD and severe ASs/p TAVR (10/19/17) who presents for follow up.   The patient does not have symptoms concerning for COVID-19 infection (fever, chills, cough, or new shortness of breath).   She underwent successful TAVR with a39m Edwards Sapien 3 THV via the TF approach on 10/19/17. Pre TAVR cath showed widely patent coronary arteries with minor luminal irregularities. 1 month echo showed EF 60%, normally functioning TAVR valve with mild PVL and mean gradient 12 mm Hg.   She later developed palpitations but Zio patch monitor did not show any significant arrhythmia. She was seen in the office by Dr. NJohnsie Cancelin  February and a diastolic murmur was noted. Plan was for follow up echo. She has had elevated BPs and Losartan was added.   Follow up echo 09/07/18 showed EF 65%, normally functioning TAVR with moderate PVL and mean gradient of 12 mm Hg.   Today she presents for follow up via telemedicine. She is doing quite well and was staying very active with no issues of chest pain or SOB. She recently had a mechanical fall and injured her leg. She also has chronic knee pain which has been made worse. This has really limited her activity. She is followed by Dr. DEstanislado Pandywho recommended a knee injection but wanted clearance from cardiology prior to proceeding. No LE edema except where she had leg injury. No orthopnea or PND. No dizziness or syncope. No blood in stool or urine. She still has some occasional palpitations.     Past Medical History:  Diagnosis Date  . Chronic headaches   . Fibromyalgia   . GERD (gastroesophageal reflux disease)   . Hemorrhoids    INTERNAL--  POST BANDING 02/ 2015  . History of kidney stones   . Hyperlipidemia   . Hypertension   . Moderate aortic stenosis   . OSA (obstructive sleep apnea) CPAP INTOLERANT   . Osteoporosis   . S/P TAVR (transcatheter aortic valve replacement) 10/19/2017   20 mm Edwards Sapien 3 transcatheter heart valve placed via percutaneous right transfemoral approach   . Severe aortic stenosis   . Spinal stenosis, multilevel    Past Surgical History:  Procedure Laterality Date  .  ANTERIOR CERVICAL DECOMP/DISCECTOMY FUSION  11-11-1999   C5 - C6  . APPENDECTOMY  AGE 52  . CARDIAC CATHETERIZATION  2008  [facility]   ESSENTIALLY NORMAL  . CATARACT EXTRACTION W/ INTRAOCULAR LENS  IMPLANT, BILATERAL  2012  . FLEXIBLE SIGMOIDOSCOPY N/A 06/01/2013   Procedure: FLEXIBLE SIGMOIDOSCOPY;  Surgeon: Inda Castle, MD;  Location: [facility];  Service: Endoscopy;  Laterality: N/A;  may need hemorrhoidal banding  . HEMORRHOIDECTOMY WITH HEMORRHOID BANDING   08-07-2010  . LAPAROSCOPIC CHOLECYSTECTOMY  1990  . RIGHT/LEFT HEART CATH AND CORONARY ANGIOGRAPHY N/A 09/29/2017   Procedure: RIGHT/LEFT HEART CATH AND CORONARY ANGIOGRAPHY;  Surgeon: Sherren Mocha, MD;  Location: [place] [facility];  Service: Cardiovascular;  Laterality: N/A;  . SHOULDER ARTHROSCOPY WITH OPEN ROTATOR CUFF REPAIR AND DISTAL CLAVICLE ACROMINECTOMY Right 05/25/2012   Procedure: SHOULDER ARTHROSCOPY WITH OPEN ROTATOR CUFF REPAIR AND DISTAL CLAVICLE ACROMINECTOMY;  Surgeon: Magnus Sinning, MD;  Location: [place];  Service: Orthopedics;  Laterality: Right;  RIGHT SHOULDER ARTHROSCOPY WITH DERBRIDEMENTOF LABRAL/BICEP TENDON, OPEN DISTAL CLAVICLE RESECTION, ANTERIOR ACROMINECTOMY ROTATOR CUFF REPAIR ANESTHESIA: GENERAL/SCALENE NERVE BLOCK  . TEE WITHOUT CARDIOVERSION Bilateral 10/19/2017   Procedure: TRANSESOPHAGEAL ECHOCARDIOGRAM (TEE);  Surgeon: Sherren Mocha, MD;  Location: [place];  Service: Open Heart Surgery;  Laterality: Bilateral;  . TENOSYNOVECTOMY Left 01/04/2014   Procedure: LEFT WRIST EXTENSOR TENOSYNOVECTOMY;  Surgeon: Linna Hoff, MD;  Location: [facility];  Service: Orthopedics;  Laterality: Left;  . THYROIDECTOMY  AGE 58   GOITER  . TONSILLECTOMY AND ADENOIDECTOMY  AGE 37  . TRANSCATHETER AORTIC VALVE REPLACEMENT, TRANSFEMORAL  10/19/2017  . TRANSCATHETER AORTIC VALVE REPLACEMENT, TRANSFEMORAL Bilateral 10/19/2017   Procedure: TRANSCATHETER AORTIC VALVE REPLACEMENT, TRANSFEMORAL;  Surgeon: Sherren Mocha, MD;  Location: [place];  Service: Open Heart Surgery;  Laterality: Bilateral;  . TRANSTHORACIC ECHOCARDIOGRAM  last one 04-20-2013  DR [facility]    NORMAL LVSF/ EF 60-65%/ MODERATE  AV  STENOSIS WITH NO AR /  MILD LAE  . UMBILICAL HERNIA REPAIR  JUNE 2006  . VAGINAL HYSTERECTOMY  01-31-2001   ANTERIOR & POSTERIOR REPAIR/ TRANSVAGINAL BLADDER SLING     Current Meds  Medication Sig  . acetaminophen (TYLENOL) 500 MG tablet Take 1,000  mg by mouth daily as needed for moderate pain or headache.  . ALOE VERA PO Take 1 capsule by mouth daily.  . Ascorbic Acid (VITAMIN C) 1000 MG tablet Take 1,000 mg by mouth daily.  Marland Kitchen aspirin EC 81 MG tablet Take 1 tablet (81 mg total) by mouth daily.  Marland Kitchen BEE POLLEN PO Take 400 mg by mouth daily.   . clobetasol cream (TEMOVATE) 3.49 % APPLY 1 APPLICATION TOPICALLY 2 (TWO) TIMES DAILY AS NEEDED.  Marland Kitchen Coenzyme Q10 (COQ-10) 100 MG CAPS Take 100 mg by mouth daily.  Marland Kitchen losartan (COZAAR) 50 MG tablet Take 1 tablet (50 mg total) by mouth daily.  . metoprolol tartrate (LOPRESSOR) 50 MG tablet Take 1 tablet (50 mg total) by mouth 2 (two) times daily.  Marland Kitchen OVER THE COUNTER MEDICATION Take 1 capsule by mouth 2 (two) times daily. Hemp Oil  . rosuvastatin (CRESTOR) 5 MG tablet Take 1 tablet (5 mg total) by mouth daily.     Allergies:   Tape and Prolia [denosumab]   Social History   Tobacco Use  . Smoking status: Former Smoker    Packs/day: 0.25    Years: 5.00    Pack years: 1.25    Types: Cigarettes    Last  attempt to quit: 04/14/2003    Years since quitting: 15.4  . Smokeless tobacco: Never Used  Substance Use Topics  . Alcohol use: Yes    Comment: once in a while-social  . Drug use: Never    Comment: hemp oil      Family Hx: The patient's family history includes Cancer (age of onset: 26) in her son; Cirrhosis in her mother; Gallbladder disease in her maternal grandmother; Heart disease in her sister; Other in her brother; Stomach cancer in her paternal grandfather.  ROS:   Please see the history of present illness.    All other systems reviewed and are negative.   Prior CV studies:   The following studies were reviewed today:  TAVR OPERATIVE NOTE Date of Procedure:10/19/2017  Preoperative Diagnosis:Severe Aortic Stenosis  Procedure:   Transcatheter Aortic Valve Replacement - Percutaneous Transfemoral Approach Edwards Sapien 3 THV (size 53m,  model # [id number], serial # [id number]  Co-Surgeons:Clarence H. ORoxy Manns MD and MSherren Mocha MD  Pre-operative Echo Findings: ? Severe aortic stenosis ? Normalleft ventricular systolic function  Post-operative Echo Findings: ? Mildparavalvular leak ? Normalleft ventricular systolic function  ________________   2D ECHO 11/25/17 ( 1 month s/p TAVR)  Study Conclusion - Left ventricle: The cavity size was normal. There was mild focal basal hypertrophy of the septum. Systolic function was normal. The estimated ejection fraction was in the range of 60% to 65%. Wall motion was normal; there were no regional wall motion abnormalities. Doppler parameters are consistent with abnormal left ventricular relaxation (grade 1 diastolic dysfunction). - Aortic valve: A TAVR bioprosthesis was present and functioning normally. There was mild perivalvular regurgitation. Peak velocity (S): 244 cm/s. Mean gradient (S): 12 mm Hg. - Mitral valve: Severely calcified annulus. - Left atrium: The atrium was mildly dilated Impressions - Compared to the prior study, there has been no significant interval change.  ______________   Echo 09/07/18 IMPRESSIONS  1. The left ventricle has hyperdynamic systolic function, with an ejection fraction of >65%. The cavity size was normal. There is mildly increased left ventricular wall thickness. Left ventricular diastolic Doppler parameters are consistent with  impaired relaxation. Elevated left atrial and left ventricular end-diastolic pressures The E/e' is >15. No evidence of left ventricular regional wall motion abnormalities.  2. The right ventricle has normal systolic function. The cavity was normal. There is no increase in right ventricular wall thickness.  3. Left atrial size was mildly dilated.  4. The mitral valve is abnormal. Mild thickening of the mitral valve leaflet. There is mild mitral annular calcification  present.  5. The tricuspid valve is grossly normal.  6. A 250man EdWende Creaseapien bioprosthetic aortic valve (TAVR) valve is present in the aortic position. Procedure Date: 10/19/17 Echo findings are consistent with moderate perivalvular leak of the aortic prosthesis.  7. The inferior vena cava was normal in size with <50% respiratory variability.  8. When compared to the prior study: 11/25/17 EF 60-65%. AV 126m mean, 27m75mpeak. Mild perivalvular AI. SUMMARY LVEF 65-70%, mild LVH, normal wall motion, grade 1 DD, elevated LV filling pressure, s/p TAVR (Edwards Sapien 3 - 20 mm) - there is moderate perivalvular leak, MAC with mild MR, mild TR, RVSP 34 mmHg   Labs/Other Tests and Data Reviewed:    EKG:  No ECG reviewed.  Recent Labs: 10/15/2017: ALT 18; B Natriuretic Peptide 107.7 10/20/2017: Magnesium 2.2 10/21/2017: Hemoglobin 11.2; Platelets 121 02/14/2018: BUN 15; Creatinine, Ser 0.87; Potassium 5.1; Sodium 140   Recent  Lipid Panel Lab Results  Component Value Date/Time   CHOL 145 05/25/2017 08:44 AM   CHOL 219 (H) 09/07/2012 09:35 AM   TRIG 136 05/25/2017 08:44 AM   TRIG 180 (H) 02/17/2013 10:07 AM   TRIG 326 (H) 09/07/2012 09:35 AM   HDL 47 05/25/2017 08:44 AM   HDL 51 02/17/2013 10:07 AM   HDL 47 09/07/2012 09:35 AM   CHOLHDL 3.1 05/25/2017 08:44 AM   CHOLHDL 5.2 10/09/2006 04:35 AM   LDLCALC 71 05/25/2017 08:44 AM   LDLCALC 89 02/17/2013 10:07 AM   LDLCALC 107 (H) 09/07/2012 09:35 AM    Wt Readings from Last 3 Encounters:  09/14/18 158 lb (71.7 kg)  05/26/18 160 lb 6.4 oz (72.8 kg)  05/02/18 162 lb 12.8 oz (73.8 kg)     Objective:    Vital Signs:  BP 139/62   Pulse (!) 56   Ht _0  (1.575 m)   Wt 158 lb (71.7 kg)   BMI 28.90 kg/m    ASSESSMENT & PLAN:    Severe AS s/p TAVR: echo 09/07/18 showed EF 65%, normally functioning TAVR with moderate PVL and mean gradient of 12 mm Hg. Given increase in PVL to moderate, plan for 6 month echo follow up. SBE  prophylaxis discussed; she has amoxicillin. Continue on aspirin alone.   HTN:BP borderline today. Continue Losartan 21m daily and Lopressor 560mBID. She admits to not taking her losartan regularly since she was concerned it may be causing stomach upset, but realized it wasnt. She will restart again now  HLD: continue statin    Knee pain: she is scheduled for an injection in her knee next week and potentially knee surgery if it does not improve. She is cleared for both from a cardiology standpoint. Will send by note to Dr. ShBo MerinoCOVID-19 Education: The signs and symptoms of COVID-19 were discussed with the patient and how to seek care for testing (follow up with PCP or arrange E-visit).  The importance of social distancing was discussed today.  Time:   Today, I have spent 25 minutes with the patient with telehealth technology discussing the above problems.     Medication Adjustments/Labs and Tests Ordered: Current medicines are reviewed at length with the patient today.  Concerns regarding medicines are outlined above.   Tests Ordered: No orders of the defined types were placed in this encounter.   Medication Changes: Meds ordered this encounter  Medications  . amoxicillin (AMOXIL) 500 MG tablet    Sig: Take 4 tablets by mouth 30-60 minutes prior to dental appointments    Dispense:  4 tablet    Refill:  4    Disposition:  Follow up prn with structural heart   Signed, KaAngelena FormPA-C  09/14/2018 2:21 PM    [place]
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
## 2018-09-14 ENCOUNTER — Telehealth (INDEPENDENT_AMBULATORY_CARE_PROVIDER_SITE_OTHER): Payer: Medicare Other | Admitting: Physician Assistant

## 2018-09-14 ENCOUNTER — Encounter: Payer: Self-pay | Admitting: Physician Assistant

## 2018-09-14 ENCOUNTER — Other Ambulatory Visit: Payer: Self-pay

## 2018-09-14 ENCOUNTER — Telehealth: Payer: Self-pay | Admitting: Rheumatology

## 2018-09-14 VITALS — BP 139/62 | HR 56 | Ht 62.0 in | Wt 158.0 lb

## 2018-09-14 DIAGNOSIS — Z952 Presence of prosthetic heart valve: Secondary | ICD-10-CM | POA: Diagnosis not present

## 2018-09-14 DIAGNOSIS — I10 Essential (primary) hypertension: Secondary | ICD-10-CM

## 2018-09-14 DIAGNOSIS — Z7189 Other specified counseling: Secondary | ICD-10-CM

## 2018-09-14 DIAGNOSIS — E7800 Pure hypercholesterolemia, unspecified: Secondary | ICD-10-CM

## 2018-09-14 MED ORDER — AMOXICILLIN 500 MG PO TABS: ORAL_TABLET | ORAL | 4 refills | Status: DC

## 2018-09-14 NOTE — Progress Notes (Signed)
[place] for patient to keep appointment on 09/19/18 for knee joint cortisone injection.

## 2018-09-14 NOTE — Progress Notes (Signed)
thx Katie 

## 2018-09-14 NOTE — Patient Instructions (Addendum)
Medication Instructions:  Your physician recommends that you continue on your current medications as directed. Please refer to the Current Medication list given to you today.  If you need a refill on your cardiac medications before your next appointment, please call your pharmacy.   Lab work: none If you have labs (blood work) drawn today and your tests are completely normal, you will receive your results only by: Marland Kitchen MyChart Message (if you have MyChart) OR . A paper copy in the mail If you have any lab test that is abnormal or we need to change your treatment, we will call you to review the results.  Testing/Procedures: Your physician has requested that you have an echocardiogram. Echocardiography is a painless test that uses sound waves to create images of your heart. It provides your doctor with information about the size and shape of your heart and how well your heart's chambers and valves are working. This procedure takes approximately one hour. There are no restrictions for this procedure.  To be done in late November or early December.  We will contact you to schedule this appointment  Follow-Up: At [facility], you and your health needs are our priority.  As part of our continuing mission to provide you with exceptional heart care, we have created designated Provider Care Teams.  These Care Teams include your primary Cardiologist (physician) and Advanced Practice Providers (APPs -  Physician Assistants and Nurse Practitioners) who all work together to provide you with the care you need, when you need it. You will need a follow up appointment in Feb 2021.  Please call our office 2 months in advance to schedule this appointment.  You may see Jenkins Rouge, MD or one of the following Advanced Practice Providers on your designated Care Team:   Truitt Merle, NP Cecilie Kicks, NP . Kathyrn Drown, NP  Any Other Special Instructions Will Be Listed Below (If Applicable).

## 2018-09-14 NOTE — Progress Notes (Signed)
Patient advised she may keep her appointment for the cortisone injection on 09/19/18.

## 2018-09-14 NOTE — Telephone Encounter (Signed)
Patient advised she should ask her cardiologist if she can have cortisone injection.

## 2018-09-14 NOTE — Telephone Encounter (Signed)
Patient should ask her cardiologist if she can have cortisone injection.

## 2018-09-14 NOTE — Telephone Encounter (Signed)
Patient called stating she has been having a lot of pain in her right knee and scheduled an appointment for a cortisone injection on 09/19/18.  Patient states she had an echo with Dr. Johnsie Cancel which she states showed a valve leak and possibly needing surgery.  Patient requested a return call to discuss whether she can still get a cortisone injection.

## 2018-09-16 ENCOUNTER — Other Ambulatory Visit: Payer: Self-pay | Admitting: Family Medicine

## 2018-09-16 NOTE — Telephone Encounter (Signed)
Not on pt active med list was d/c'd.

## 2018-09-19 ENCOUNTER — Encounter: Payer: Self-pay | Admitting: Physician Assistant

## 2018-09-19 ENCOUNTER — Other Ambulatory Visit: Payer: Self-pay

## 2018-09-19 ENCOUNTER — Ambulatory Visit: Payer: Medicare Other | Admitting: Physician Assistant

## 2018-09-19 VITALS — BP 151/52 | HR 60 | Resp 13 | Ht 64.0 in | Wt 161.0 lb

## 2018-09-19 DIAGNOSIS — Z8719 Personal history of other diseases of the digestive system: Secondary | ICD-10-CM

## 2018-09-19 DIAGNOSIS — Z8679 Personal history of other diseases of the circulatory system: Secondary | ICD-10-CM

## 2018-09-19 DIAGNOSIS — I350 Nonrheumatic aortic (valve) stenosis: Secondary | ICD-10-CM

## 2018-09-19 DIAGNOSIS — M19042 Primary osteoarthritis, left hand: Secondary | ICD-10-CM

## 2018-09-19 DIAGNOSIS — M25561 Pain in right knee: Secondary | ICD-10-CM | POA: Diagnosis not present

## 2018-09-19 DIAGNOSIS — Z840 Family history of diseases of the skin and subcutaneous tissue: Secondary | ICD-10-CM

## 2018-09-19 DIAGNOSIS — M170 Bilateral primary osteoarthritis of knee: Secondary | ICD-10-CM | POA: Diagnosis not present

## 2018-09-19 DIAGNOSIS — K219 Gastro-esophageal reflux disease without esophagitis: Secondary | ICD-10-CM

## 2018-09-19 DIAGNOSIS — R768 Other specified abnormal immunological findings in serum: Secondary | ICD-10-CM | POA: Diagnosis not present

## 2018-09-19 DIAGNOSIS — M797 Fibromyalgia: Secondary | ICD-10-CM

## 2018-09-19 DIAGNOSIS — Z87442 Personal history of urinary calculi: Secondary | ICD-10-CM

## 2018-09-19 DIAGNOSIS — Z862 Personal history of diseases of the blood and blood-forming organs and certain disorders involving the immune mechanism: Secondary | ICD-10-CM

## 2018-09-19 DIAGNOSIS — G8929 Other chronic pain: Secondary | ICD-10-CM | POA: Diagnosis not present

## 2018-09-19 DIAGNOSIS — M19041 Primary osteoarthritis, right hand: Secondary | ICD-10-CM

## 2018-09-19 DIAGNOSIS — M810 Age-related osteoporosis without current pathological fracture: Secondary | ICD-10-CM

## 2018-09-19 DIAGNOSIS — M5030 Other cervical disc degeneration, unspecified cervical region: Secondary | ICD-10-CM

## 2018-09-19 DIAGNOSIS — I10 Essential (primary) hypertension: Secondary | ICD-10-CM

## 2018-09-19 MED ORDER — LIDOCAINE HCL 1 % IJ SOLN
1.5000 mL | INTRAMUSCULAR | Status: AC | PRN
  Administered 2018-09-19: 1.5 mL

## 2018-09-19 MED ORDER — TRIAMCINOLONE ACETONIDE 40 MG/ML IJ SUSP
40.0000 mg | INTRAMUSCULAR | Status: AC | PRN
  Administered 2018-09-19: 40 mg via INTRA_ARTICULAR

## 2018-09-22 DIAGNOSIS — H4311 Vitreous hemorrhage, right eye: Secondary | ICD-10-CM | POA: Diagnosis not present

## 2018-09-23 ENCOUNTER — Encounter (INDEPENDENT_AMBULATORY_CARE_PROVIDER_SITE_OTHER): Payer: Self-pay | Admitting: Ophthalmology

## 2018-09-23 ENCOUNTER — Other Ambulatory Visit: Payer: Self-pay

## 2018-09-23 ENCOUNTER — Ambulatory Visit (INDEPENDENT_AMBULATORY_CARE_PROVIDER_SITE_OTHER): Payer: Medicare Other | Admitting: Ophthalmology

## 2018-09-23 DIAGNOSIS — Z9889 Other specified postprocedural states: Secondary | ICD-10-CM | POA: Diagnosis not present

## 2018-09-23 DIAGNOSIS — I10 Essential (primary) hypertension: Secondary | ICD-10-CM | POA: Diagnosis not present

## 2018-09-23 DIAGNOSIS — H209 Unspecified iridocyclitis: Secondary | ICD-10-CM

## 2018-09-23 DIAGNOSIS — H3581 Retinal edema: Secondary | ICD-10-CM

## 2018-09-23 DIAGNOSIS — Z87828 Personal history of other (healed) physical injury and trauma: Secondary | ICD-10-CM

## 2018-09-23 DIAGNOSIS — H21231 Degeneration of iris (pigmentary), right eye: Secondary | ICD-10-CM | POA: Diagnosis not present

## 2018-09-23 DIAGNOSIS — H179 Unspecified corneal scar and opacity: Secondary | ICD-10-CM

## 2018-09-23 DIAGNOSIS — H35033 Hypertensive retinopathy, bilateral: Secondary | ICD-10-CM

## 2018-09-23 DIAGNOSIS — Z961 Presence of intraocular lens: Secondary | ICD-10-CM

## 2018-09-23 MED ORDER — PREDNISOLONE ACETATE 1 % OP SUSP: 1.0000 [drp] | Freq: Four times a day (QID) | OPHTHALMIC | 0 refills | Status: DC

## 2018-09-23 NOTE — Progress Notes (Deleted)
[facility] Note  09/23/2018     CHIEF COMPLAINT Patient presents for No chief complaint on file.   HISTORY OF PRESENT ILLNESS: [name] is a 83 y.o. female who presents to the clinic today for:     Referring physician: [name], DO [place],  [place] [address]  HISTORICAL INFORMATION:   Selected notes from the [place]: No current outpatient medications on file. (Ophthalmic Drugs)   No current facility-administered medications for this visit.  (Ophthalmic Drugs)   Current Outpatient Medications (Other)  Medication Sig  . acetaminophen (TYLENOL) 500 MG tablet Take 1,000 mg by mouth daily as needed for moderate pain or headache.  . ALOE VERA PO Take 1 capsule by mouth daily.  Marland Kitchen amoxicillin (AMOXIL) 500 MG tablet Take 4 tablets by mouth 30-60 minutes prior to dental appointments  . Ascorbic Acid (VITAMIN C) 1000 MG tablet Take 1,000 mg by mouth daily.  Marland Kitchen aspirin EC 81 MG tablet Take 1 tablet (81 mg total) by mouth daily.  Marland Kitchen BEE POLLEN PO Take 400 mg by mouth daily.   . clobetasol cream (TEMOVATE) 1.96 % APPLY 1 APPLICATION TOPICALLY 2 (TWO) TIMES DAILY AS NEEDED.  Marland Kitchen Coenzyme Q10 (COQ-10) 100 MG CAPS Take 100 mg by mouth daily.  Marland Kitchen losartan (COZAAR) 50 MG tablet Take 1 tablet (50 mg total) by mouth daily.  . metoprolol tartrate (LOPRESSOR) 50 MG tablet Take 1 tablet (50 mg total) by mouth 2 (two) times daily.  Marland Kitchen OVER THE COUNTER MEDICATION Take 1 capsule by mouth 2 (two) times daily. Hemp Oil  . rosuvastatin (CRESTOR) 5 MG tablet Take 1 tablet (5 mg total) by mouth daily.   No current facility-administered medications for this visit.  (Other)      REVIEW OF SYSTEMS:    ALLERGIES Allergies  Allergen Reactions  . Tape Other (See Comments)    Dermatitis rash "with extended exposure"  . Prolia [Denosumab] Other (See Comments)    Arthralgia/ myalgia/ jaw pain/ headache    PAST  MEDICAL HISTORY Past Medical History:  Diagnosis Date  . Chronic headaches   . Fibromyalgia   . GERD (gastroesophageal reflux disease)   . Hemorrhoids    INTERNAL--  POST BANDING 02/ 2015  . History of kidney stones   . Hyperlipidemia   . Hypertension   . Moderate aortic stenosis   . OSA (obstructive sleep apnea) CPAP INTOLERANT   . Osteoporosis   . S/P TAVR (transcatheter aortic valve replacement) 10/19/2017   20 mm Edwards Sapien 3 transcatheter heart valve placed via percutaneous right transfemoral approach   . Severe aortic stenosis   . Spinal stenosis, multilevel    Past Surgical History:  Procedure Laterality Date  . ANTERIOR CERVICAL DECOMP/DISCECTOMY FUSION  11-11-1999   C5 - C6  . APPENDECTOMY  AGE 11  . CARDIAC CATHETERIZATION  2008  DR [facility]   ESSENTIALLY NORMAL  . CATARACT EXTRACTION W/ INTRAOCULAR LENS  IMPLANT, BILATERAL  2012  . FLEXIBLE SIGMOIDOSCOPY N/A 06/01/2013   Procedure: FLEXIBLE SIGMOIDOSCOPY;  Surgeon: Inda Castle, MD;  Location: [facility];  Service: Endoscopy;  Laterality: N/A;  may need hemorrhoidal banding  . HEMORRHOIDECTOMY WITH HEMORRHOID BANDING  08-07-2010  . LAPAROSCOPIC CHOLECYSTECTOMY  1990  . RIGHT/LEFT HEART CATH AND CORONARY ANGIOGRAPHY N/A 09/29/2017   Procedure: RIGHT/LEFT HEART CATH AND CORONARY ANGIOGRAPHY;  Surgeon: Sherren Mocha, MD;  Location: [place];  Service: Cardiovascular;  Laterality: N/A;  . SHOULDER ARTHROSCOPY WITH OPEN ROTATOR CUFF REPAIR AND DISTAL CLAVICLE ACROMINECTOMY Right 05/25/2012   Procedure: SHOULDER ARTHROSCOPY WITH OPEN ROTATOR CUFF REPAIR AND DISTAL CLAVICLE ACROMINECTOMY;  Surgeon: Magnus Sinning, MD;  Location: [place];  Service: Orthopedics;  Laterality: Right;  RIGHT SHOULDER ARTHROSCOPY WITH DERBRIDEMENTOF LABRAL/BICEP TENDON, OPEN DISTAL CLAVICLE RESECTION, ANTERIOR ACROMINECTOMY ROTATOR CUFF REPAIR ANESTHESIA: GENERAL/SCALENE NERVE BLOCK  . TEE WITHOUT CARDIOVERSION  Bilateral 10/19/2017   Procedure: TRANSESOPHAGEAL ECHOCARDIOGRAM (TEE);  Surgeon: Sherren Mocha, MD;  Location: [place];  Service: Open Heart Surgery;  Laterality: Bilateral;  . TENOSYNOVECTOMY Left 01/04/2014   Procedure: LEFT WRIST EXTENSOR TENOSYNOVECTOMY;  Surgeon: Linna Hoff, MD;  Location: [facility];  Service: Orthopedics;  Laterality: Left;  . THYROIDECTOMY  AGE 29   GOITER  . TONSILLECTOMY AND ADENOIDECTOMY  AGE 73  . TRANSCATHETER AORTIC VALVE REPLACEMENT, TRANSFEMORAL  10/19/2017  . TRANSCATHETER AORTIC VALVE REPLACEMENT, TRANSFEMORAL Bilateral 10/19/2017   Procedure: TRANSCATHETER AORTIC VALVE REPLACEMENT, TRANSFEMORAL;  Surgeon: Sherren Mocha, MD;  Location: [place];  Service: Open Heart Surgery;  Laterality: Bilateral;  . TRANSTHORACIC ECHOCARDIOGRAM  last one 04-20-2013  DR [facility]    NORMAL LVSF/ EF 60-65%/ MODERATE  AV  STENOSIS WITH NO AR /  MILD LAE  . UMBILICAL HERNIA REPAIR  JUNE 2006  . VAGINAL HYSTERECTOMY  01-31-2001   ANTERIOR & POSTERIOR REPAIR/ TRANSVAGINAL BLADDER SLING    FAMILY HISTORY Family History  Problem Relation Age of Onset  . Cirrhosis Mother        drinking  . Other Brother        duodenal ulcer  . Heart disease Sister   . Cancer Son 79       colon  . Gallbladder disease Maternal Grandmother   . Stomach cancer Paternal Grandfather     SOCIAL HISTORY Social History   Tobacco Use  . Smoking status: Former Smoker    Packs/day: 0.25    Years: 5.00    Pack years: 1.25    Types: Cigarettes    Quit date: 04/14/2003    Years since quitting: 15.4  . Smokeless tobacco: Never Used  Substance Use Topics  . Alcohol use: Yes    Comment: once in a while-social  . Drug use: Never    Comment: hemp oil          OPHTHALMIC EXAM:  Not recorded      IMAGING AND PROCEDURES  Imaging and Procedures for 09/23/18           ASSESSMENT/PLAN:  No diagnosis found.  1.  2.  3.  Ophthalmic Meds Ordered this visit:  No  orders of the defined types were placed in this encounter.      No follow-ups on file.  There are no Patient Instructions on file for this visit.   Explained the diagnoses, plan, and follow up with the patient and they expressed understanding.  Patient expressed understanding of the importance of proper follow up care.   Gardiner Sleeper, M.D., Ph.D. Diseases & Surgery of the Retina and [place] 09/23/18     Abbreviations: M myopia (nearsighted); A astigmatism; H hyperopia (farsighted); P presbyopia; Mrx spectacle prescription;  CTL contact lenses; OD right eye; OS left eye; OU both eyes  XT exotropia; ET esotropia; PEK punctate epithelial keratitis; PEE punctate epithelial erosions; DES dry eye syndrome; MGD meibomian gland dysfunction; ATs artificial tears; PFAT's preservative free artificial tears;  [place] nuclear sclerotic cataract; PSC posterior subcapsular cataract; ERM epi-retinal membrane; PVD posterior vitreous detachment; RD retinal detachment; DM diabetes mellitus; DR diabetic retinopathy; NPDR non-proliferative diabetic retinopathy; PDR proliferative diabetic retinopathy; CSME clinically significant macular edema; DME diabetic macular edema; dbh dot blot hemorrhages; CWS cotton wool spot; POAG primary open angle glaucoma; C/D cup-to-disc ratio; HVF humphrey visual field; GVF goldmann visual field; OCT optical coherence tomography; IOP intraocular pressure; BRVO Branch retinal vein occlusion; CRVO central retinal vein occlusion; CRAO central retinal artery occlusion; BRAO branch retinal artery occlusion; RT retinal tear; SB scleral buckle; PPV pars plana vitrectomy; VH Vitreous hemorrhage; PRP panretinal laser photocoagulation; IVK intravitreal kenalog; VMT vitreomacular traction; MH Macular hole;  NVD neovascularization of the disc; NVE neovascularization elsewhere; AREDS age related eye disease study; ARMD age related macular degeneration; POAG primary  open angle glaucoma; EBMD epithelial/anterior basement membrane dystrophy; ACIOL anterior chamber intraocular lens; IOL intraocular lens; PCIOL posterior chamber intraocular lens; Phaco/IOL phacoemulsification with intraocular lens placement; [place] photorefractive keratectomy; LASIK laser assisted in situ keratomileusis; HTN hypertension; DM diabetes mellitus; COPD chronic obstructive pulmonary disease
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
## 2018-09-30 ENCOUNTER — Encounter: Payer: Self-pay | Admitting: Thoracic Surgery (Cardiothoracic Vascular Surgery)
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
## 2018-10-07 ENCOUNTER — Ambulatory Visit (INDEPENDENT_AMBULATORY_CARE_PROVIDER_SITE_OTHER): Payer: Medicare Other | Admitting: Ophthalmology

## 2018-10-07 ENCOUNTER — Other Ambulatory Visit: Payer: Self-pay

## 2018-10-07 ENCOUNTER — Encounter (INDEPENDENT_AMBULATORY_CARE_PROVIDER_SITE_OTHER): Payer: Self-pay | Admitting: Ophthalmology

## 2018-10-07 DIAGNOSIS — H21231 Degeneration of iris (pigmentary), right eye: Secondary | ICD-10-CM | POA: Diagnosis not present

## 2018-10-07 DIAGNOSIS — H35033 Hypertensive retinopathy, bilateral: Secondary | ICD-10-CM

## 2018-10-07 DIAGNOSIS — H3581 Retinal edema: Secondary | ICD-10-CM | POA: Diagnosis not present

## 2018-10-07 DIAGNOSIS — Z87828 Personal history of other (healed) physical injury and trauma: Secondary | ICD-10-CM

## 2018-10-07 DIAGNOSIS — H179 Unspecified corneal scar and opacity: Secondary | ICD-10-CM

## 2018-10-07 DIAGNOSIS — I10 Essential (primary) hypertension: Secondary | ICD-10-CM

## 2018-10-07 DIAGNOSIS — H209 Unspecified iridocyclitis: Secondary | ICD-10-CM

## 2018-10-07 DIAGNOSIS — Z961 Presence of intraocular lens: Secondary | ICD-10-CM

## 2018-10-07 DIAGNOSIS — Z9889 Other specified postprocedural states: Secondary | ICD-10-CM

## 2018-10-25 ENCOUNTER — Other Ambulatory Visit (HOSPITAL_COMMUNITY): Payer: Medicare Other

## 2018-10-26 ENCOUNTER — Emergency Department (HOSPITAL_COMMUNITY)
Admission: EM | Admit: 2018-10-26 | Discharge: 2018-10-26 | Disposition: A | Discharge status: Home / Self Care | Payer: Medicare Other | Attending: Emergency Medicine | Admitting: Emergency Medicine

## 2018-10-26 ENCOUNTER — Encounter (HOSPITAL_COMMUNITY): Payer: Self-pay

## 2018-10-26 ENCOUNTER — Other Ambulatory Visit: Payer: Self-pay

## 2018-10-26 ENCOUNTER — Emergency Department (HOSPITAL_COMMUNITY): Payer: Medicare Other

## 2018-10-26 DIAGNOSIS — R42 Dizziness and giddiness: Secondary | ICD-10-CM | POA: Diagnosis not present

## 2018-10-26 DIAGNOSIS — Y999 Unspecified external cause status: Secondary | ICD-10-CM | POA: Diagnosis not present

## 2018-10-26 DIAGNOSIS — I10 Essential (primary) hypertension: Secondary | ICD-10-CM | POA: Insufficient documentation

## 2018-10-26 DIAGNOSIS — S199XXA Unspecified injury of neck, initial encounter: Secondary | ICD-10-CM | POA: Diagnosis not present

## 2018-10-26 DIAGNOSIS — W19XXXA Unspecified fall, initial encounter: Secondary | ICD-10-CM

## 2018-10-26 DIAGNOSIS — Y939 Activity, unspecified: Secondary | ICD-10-CM | POA: Diagnosis not present

## 2018-10-26 DIAGNOSIS — Z79899 Other long term (current) drug therapy: Secondary | ICD-10-CM | POA: Diagnosis not present

## 2018-10-26 DIAGNOSIS — W102XXA Fall (on)(from) incline, initial encounter: Secondary | ICD-10-CM | POA: Diagnosis not present

## 2018-10-26 DIAGNOSIS — S0990XA Unspecified injury of head, initial encounter: Secondary | ICD-10-CM | POA: Diagnosis not present

## 2018-10-26 DIAGNOSIS — R51 Headache: Secondary | ICD-10-CM | POA: Diagnosis not present

## 2018-10-26 DIAGNOSIS — S0083XA Contusion of other part of head, initial encounter: Secondary | ICD-10-CM | POA: Diagnosis not present

## 2018-10-26 DIAGNOSIS — Y92481 Parking lot as the place of occurrence of the external cause: Secondary | ICD-10-CM | POA: Insufficient documentation

## 2018-10-26 DIAGNOSIS — Z87891 Personal history of nicotine dependence: Secondary | ICD-10-CM | POA: Diagnosis not present
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
## 2018-10-26 NOTE — ED Notes (Signed)
Pt stated "she has to leave now"- pt made aware of leaving AMA- pt verbalized understanding of risks -Dr Rogene Houston made aware.

## 2018-10-26 NOTE — ED Provider Notes (Signed)
[facility] EMERGENCY DEPARTMENT Provider Note   CSN: [id number] Arrival date & time: 10/26/18  1432     History   Chief Complaint Chief Complaint  Patient presents with   Fall   Headache    HPI [name] is a 83 y.o. female.     HPI  Patient is an 83 year old female with past medical history of hypertension, hyperlipidemia, OSA, fibromyalgia, status post TAVR in 2019 presenting for fall.  Patient reports that yesterday evening she was running errands and tripped on a ramp in a parking lot.  This is a mechanical fall.  She fell onto the right side of her face.  She denies loss conscious.  She takes 81 mg of aspirin daily but no other blood thinners.  She reports that she woke up with a global headache, mild in nature that resolved with Tylenol.  She had some slight swelling under her eye but she put ice on it and it resolved.  No visual disturbance, dizziness or lightheadedness, weakness or numbness in extremities today.  No vomiting.  Past Medical History:  Diagnosis Date   Chronic headaches    Fibromyalgia    GERD (gastroesophageal reflux disease)    Hemorrhoids    INTERNAL--  POST BANDING 02/ 2015   History of kidney stones    Hyperlipidemia    Hypertension    Moderate aortic stenosis    OSA (obstructive sleep apnea) CPAP INTOLERANT    Osteoporosis    S/P TAVR (transcatheter aortic valve replacement) 10/19/2017   20 mm Edwards Sapien 3 transcatheter heart valve placed via percutaneous right transfemoral approach    Severe aortic stenosis    Spinal stenosis, multilevel     Patient Active Problem List   Diagnosis Date Noted   S/P TAVR (transcatheter aortic valve replacement) 10/19/2017   Chronic fatigue 08/25/2017   DDD (degenerative disc disease), cervical 07/08/2017   Bursitis of left shoulder 07/08/2017   Fibromyalgia 07/08/2017   Atherosclerosis of abdominal aorta ([place]) 06/29/2017   Mixed hyperlipidemia 03/19/2017   Osteoporosis  04/21/2016   Internal hemorrhoids with other complication 68/02/5725   GI bleed 05/30/2013   NSAID long-term use 05/30/2013   Severe aortic stenosis 05/29/2013   DIVERTICULOSIS-COLON 01/13/2010   CONSTIPATION 01/13/2010   PERSONAL HISTORY OF COLONIC POLYPS 01/13/2010   Essential hypertension 08/28/2008   GERD 08/28/2008   PALPITATIONS 08/28/2008    Past Surgical History:  Procedure Laterality Date   ANTERIOR CERVICAL DECOMP/DISCECTOMY FUSION  11-11-1999   C5 - C6   APPENDECTOMY  AGE 23   CARDIAC CATHETERIZATION  2008  DR NISHAN   ESSENTIALLY NORMAL   CATARACT EXTRACTION Bilateral    CATARACT EXTRACTION W/ INTRAOCULAR LENS  IMPLANT, BILATERAL  2012   EYE SURGERY     FLEXIBLE SIGMOIDOSCOPY N/A 06/01/2013   Procedure: FLEXIBLE SIGMOIDOSCOPY;  Surgeon: Inda Castle, MD;  Location: [facility];  Service: Endoscopy;  Laterality: N/A;  may need hemorrhoidal banding   HEMORRHOIDECTOMY WITH HEMORRHOID BANDING  08-07-2010   LAPAROSCOPIC CHOLECYSTECTOMY  1990   RIGHT/LEFT HEART CATH AND CORONARY ANGIOGRAPHY N/A 09/29/2017   Procedure: RIGHT/LEFT HEART CATH AND CORONARY ANGIOGRAPHY;  Surgeon: Sherren Mocha, MD;  Location: [place] [facility];  Service: Cardiovascular;  Laterality: N/A;   SHOULDER ARTHROSCOPY WITH OPEN ROTATOR CUFF REPAIR AND DISTAL CLAVICLE ACROMINECTOMY Right 05/25/2012   Procedure: SHOULDER ARTHROSCOPY WITH OPEN ROTATOR CUFF REPAIR AND DISTAL CLAVICLE ACROMINECTOMY;  Surgeon: Magnus Sinning, MD;  Location: [place];  Service: Orthopedics;  Laterality: Right;  RIGHT SHOULDER ARTHROSCOPY WITH DERBRIDEMENTOF LABRAL/BICEP TENDON, OPEN DISTAL CLAVICLE RESECTION, ANTERIOR ACROMINECTOMY ROTATOR CUFF REPAIR ANESTHESIA: GENERAL/SCALENE NERVE BLOCK   TEE WITHOUT CARDIOVERSION Bilateral 10/19/2017   Procedure: TRANSESOPHAGEAL ECHOCARDIOGRAM (TEE);  Surgeon: Sherren Mocha, MD;  Location: [place];  Service: Open Heart Surgery;  Laterality:  Bilateral;   TENOSYNOVECTOMY Left 01/04/2014   Procedure: LEFT WRIST EXTENSOR TENOSYNOVECTOMY;  Surgeon: Linna Hoff, MD;  Location: [facility];  Service: Orthopedics;  Laterality: Left;   THYROIDECTOMY  AGE 89   GOITER   TONSILLECTOMY AND ADENOIDECTOMY  AGE 44   TRANSCATHETER AORTIC VALVE REPLACEMENT, TRANSFEMORAL  10/19/2017   TRANSCATHETER AORTIC VALVE REPLACEMENT, TRANSFEMORAL Bilateral 10/19/2017   Procedure: TRANSCATHETER AORTIC VALVE REPLACEMENT, TRANSFEMORAL;  Surgeon: Sherren Mocha, MD;  Location: [place];  Service: Open Heart Surgery;  Laterality: Bilateral;   TRANSTHORACIC ECHOCARDIOGRAM  last one 04-20-2013  DR [facility]    NORMAL LVSF/ EF 60-65%/ MODERATE  AV  STENOSIS WITH NO AR /  MILD LAE   UMBILICAL HERNIA REPAIR  JUNE 2006   VAGINAL HYSTERECTOMY  01-31-2001   ANTERIOR & POSTERIOR REPAIR/ TRANSVAGINAL BLADDER SLING     OB History    Gravida  4   Para  3   Term  3   Preterm      AB  1   Living  3     SAB  1   TAB      Ectopic      Multiple      Live Births               Home Medications    Prior to Admission medications   Medication Sig Start Date End Date Taking? Authorizing Provider  acetaminophen (TYLENOL) 500 MG tablet Take 1,000 mg by mouth daily as needed for moderate pain or headache.    [provider]  ALOE VERA PO Take 1 capsule by mouth daily.    [provider]  amoxicillin (AMOXIL) 500 MG tablet Take 4 tablets by mouth 30-60 minutes prior to dental appointments 09/14/18   Eileen Stanford, PA-C  Ascorbic Acid (VITAMIN C) 1000 MG tablet Take 1,000 mg by mouth daily.    [provider]  aspirin EC 81 MG tablet Take 1 tablet (81 mg total) by mouth daily. 01/12/18   [name], DO  BEE POLLEN PO Take 400 mg by mouth daily.     [provider]  clobetasol cream (TEMOVATE) 0.93 % APPLY 1 APPLICATION TOPICALLY 2 (TWO) TIMES DAILY AS NEEDED. 07/01/18   [name], DO    Coenzyme Q10 (COQ-10) 100 MG CAPS Take 100 mg by mouth daily.    [provider]  losartan (COZAAR) 50 MG tablet Take 1 tablet (50 mg total) by mouth daily. 02/14/18   Bhagat, Crista Luria, PA  metoprolol tartrate (LOPRESSOR) 50 MG tablet Take 1 tablet (50 mg total) by mouth 2 (two) times daily. 01/17/18   [name], MD  OVER THE COUNTER MEDICATION Take 1 capsule by mouth 2 (two) times daily. Hemp Oil    [provider]  prednisoLONE acetate (PRED FORTE) 1 % ophthalmic suspension Place 1 drop into the right eye 4 (four) times daily. 09/23/18   Bernarda Caffey, MD  rosuvastatin (CRESTOR) 5 MG tablet Take 1 tablet (5 mg total) by mouth daily. 08/01/18   [name], DO    Family History Family History  Problem Relation Age of Onset   Cirrhosis Mother  drinking   Other Brother        duodenal ulcer   Heart disease Sister    Cancer Son 48       colon   Gallbladder disease Maternal Grandmother    Stomach cancer Paternal Grandfather     Social History Social History   Tobacco Use   Smoking status: Former Smoker    Packs/day: 0.25    Years: 5.00    Pack years: 1.25    Types: Cigarettes    Quit date: 04/14/2003    Years since quitting: 15.5   Smokeless tobacco: Never Used  Substance Use Topics   Alcohol use: Yes    Comment: once in a while-social   Drug use: Never    Comment: hemp oil      Allergies   Tape and Prolia [denosumab]   Review of Systems Review of Systems  Constitutional: Negative for chills and fever.  HENT: Negative for congestion and sore throat.   Eyes: Negative for visual disturbance.  Respiratory: Negative for cough, chest tightness and shortness of breath.   Cardiovascular: Negative for chest pain.  Gastrointestinal: Negative for abdominal pain, nausea and vomiting.  Genitourinary: Negative for dysuria and flank pain.  Musculoskeletal: Negative for back pain and myalgias.  Skin: Negative for rash.   Neurological: Positive for headaches. Negative for dizziness, syncope and light-headedness.     Physical Exam Updated Vital Signs BP (!) 165/44    Pulse (!) 53    Temp 98 F (36.7 C) (Oral)    Resp 16    SpO2 97%   Physical Exam Vitals signs and nursing note reviewed.  Constitutional:      General: She is not in acute distress.    Appearance: She is well-developed.  HENT:     Head: Normocephalic and atraumatic.  Eyes:     Conjunctiva/sclera: Conjunctivae normal.     Pupils: Pupils are equal, round, and reactive to light.  Neck:     Musculoskeletal: Normal range of motion and neck supple.  Cardiovascular:     Rate and Rhythm: Normal rate and regular rhythm.     Heart sounds: S1 normal and S2 normal. No murmur.  Pulmonary:     Effort: Pulmonary effort is normal.     Breath sounds: Normal breath sounds. No wheezing or rales.  Abdominal:     General: There is no distension.     Palpations: Abdomen is soft.     Tenderness: There is no abdominal tenderness. There is no guarding.  Musculoskeletal: Normal range of motion.        General: No deformity.  Lymphadenopathy:     Cervical: No cervical adenopathy.  Skin:    General: Skin is warm and dry.     Findings: No erythema or rash.  Neurological:     Mental Status: She is alert.     GCS: GCS eye subscore is 4. GCS verbal subscore is 5. GCS motor subscore is 6.     Comments: Mental Status:  Alert, oriented, thought content appropriate, able to give a coherent history. Speech fluent without evidence of aphasia. Able to follow 2 step commands without difficulty.  Cranial Nerves:  II:  Peripheral visual fields grossly normal, pupils equal, round, reactive to light III,IV, VI: ptosis not present, extra-ocular motions intact bilaterally  V,VII: smile symmetric, facial light touch sensation equal VIII: hearing grossly normal to voice  X: uvula elevates symmetrically  XI: bilateral shoulder shrug symmetric and strong XII: midline  tongue extension  without fassiculations Motor:  Normal tone. 5/5 in upper and lower extremities bilaterally including strong and equal grip strength and dorsiflexion/plantar flexion Sensory: Pinprick and light touch normal in all extremities.  Cerebellar: normal finger-to-nose with bilateral upper extremities Gait: normal gait and balance Stance: Romberg negative. No pronator drift and good coordination, strength, and position sense with tapping of bilateral arms (performed in sitting position). CV: distal pulses palpable throughout    Psychiatric:        Behavior: Behavior normal.        Thought Content: Thought content normal.        Judgment: Judgment normal.      ED Treatments / Results  Labs (all labs ordered are listed, but only abnormal results are displayed) Labs Reviewed - No data to display  EKG None  Radiology Ct Head Wo Contrast  Result Date: 10/26/2018 CLINICAL DATA:  83 year old female status post trip and fall in parking lot yesterday. Headache and dizziness. EXAM: CT HEAD WITHOUT CONTRAST CT MAXILLOFACIAL WITHOUT CONTRAST CT CERVICAL SPINE WITHOUT CONTRAST TECHNIQUE: Multidetector CT imaging of the head, cervical spine, and maxillofacial structures were performed using the standard protocol without intravenous contrast. Multiplanar CT image reconstructions of the cervical spine and maxillofacial structures were also generated. COMPARISON:  [facility] and cervical spine CT 07/17/2018 FINDINGS: CT HEAD FINDINGS Brain: Stable cerebral volume. No midline shift, mass effect, or evidence of intracranial mass lesion. No ventriculomegaly. Stable gray-white matter differentiation throughout the brain, minimal to mild for age white matter hypodensity. No cortically based acute infarct identified. No acute intracranial hemorrhage identified. Vascular: Calcified atherosclerosis at the skull base. Tortuous intracranial arteries. No suspicious intracranial vascular  hyperdensity. Skull: Stable, intact. Other: No acute scalp soft tissue findings. CT MAXILLOFACIAL FINDINGS Osseous: Mandible intact. No maxilla or zygoma fracture. Central skull base intact. Orbits: Orbital walls intact. Symmetric and negative orbits soft tissues. Sinuses: Clear.  Tympanic cavities and mastoids are clear. Soft tissues: Negative visible noncontrast larynx, pharynx, parapharyngeal spaces, retropharyngeal space, sublingual space, submandibular spaces, parotid and masticator spaces. No definite superficial soft tissue injury. CT CERVICAL SPINE FINDINGS Alignment: Stable straightening of cervical lordosis. Cervicothoracic junction alignment is within normal limits. Bilateral posterior element alignment is within normal limits. Skull base and vertebrae: Visualized skull base is intact. No atlanto-occipital dissociation. No acute osseous abnormality identified. Soft tissues and spinal canal: No prevertebral fluid or swelling. No visible canal hematoma. Negative noncontrast neck soft tissues. Disc levels: Prior C5-C6 ACDF with solid arthrodesis. Stable generally mild for age degenerative changes elsewhere. Advanced chronic T1-T2 disc and endplate degeneration. Upper chest: Visible upper thoracic levels appear stable and intact. Negative lung apices. Negative noncontrast thoracic inlet. IMPRESSION: 1. No acute traumatic injury identified in the head, face, or cervical spine. 2. Stable and largely negative for age noncontrast CT appearance of the brain. 3. Stable cervical spine, prior C5-C6 ACDF with solid arthrodesis. Electronically Signed   By: Genevie Ann M.D.   On: 10/26/2018 19:24   Ct Cervical Spine Wo Contrast  Result Date: 10/26/2018 CLINICAL DATA:  83 year old female status post trip and fall in parking lot yesterday. Headache and dizziness. EXAM: CT HEAD WITHOUT CONTRAST CT MAXILLOFACIAL WITHOUT CONTRAST CT CERVICAL SPINE WITHOUT CONTRAST TECHNIQUE: Multidetector CT imaging of the head, cervical  spine, and maxillofacial structures were performed using the standard protocol without intravenous contrast. Multiplanar CT image reconstructions of the cervical spine and maxillofacial structures were also generated. COMPARISON:  [facility] and cervical spine CT 07/17/2018 FINDINGS:  CT HEAD FINDINGS Brain: Stable cerebral volume. No midline shift, mass effect, or evidence of intracranial mass lesion. No ventriculomegaly. Stable gray-white matter differentiation throughout the brain, minimal to mild for age white matter hypodensity. No cortically based acute infarct identified. No acute intracranial hemorrhage identified. Vascular: Calcified atherosclerosis at the skull base. Tortuous intracranial arteries. No suspicious intracranial vascular hyperdensity. Skull: Stable, intact. Other: No acute scalp soft tissue findings. CT MAXILLOFACIAL FINDINGS Osseous: Mandible intact. No maxilla or zygoma fracture. Central skull base intact. Orbits: Orbital walls intact. Symmetric and negative orbits soft tissues. Sinuses: Clear.  Tympanic cavities and mastoids are clear. Soft tissues: Negative visible noncontrast larynx, pharynx, parapharyngeal spaces, retropharyngeal space, sublingual space, submandibular spaces, parotid and masticator spaces. No definite superficial soft tissue injury. CT CERVICAL SPINE FINDINGS Alignment: Stable straightening of cervical lordosis. Cervicothoracic junction alignment is within normal limits. Bilateral posterior element alignment is within normal limits. Skull base and vertebrae: Visualized skull base is intact. No atlanto-occipital dissociation. No acute osseous abnormality identified. Soft tissues and spinal canal: No prevertebral fluid or swelling. No visible canal hematoma. Negative noncontrast neck soft tissues. Disc levels: Prior C5-C6 ACDF with solid arthrodesis. Stable generally mild for age degenerative changes elsewhere. Advanced chronic T1-T2 disc and endplate  degeneration. Upper chest: Visible upper thoracic levels appear stable and intact. Negative lung apices. Negative noncontrast thoracic inlet. IMPRESSION: 1. No acute traumatic injury identified in the head, face, or cervical spine. 2. Stable and largely negative for age noncontrast CT appearance of the brain. 3. Stable cervical spine, prior C5-C6 ACDF with solid arthrodesis. Electronically Signed   By: Genevie Ann M.D.   On: 10/26/2018 19:24   Ct Maxillofacial Wo Cm  Result Date: 10/26/2018 CLINICAL DATA:  83 year old female status post trip and fall in parking lot yesterday. Headache and dizziness. EXAM: CT HEAD WITHOUT CONTRAST CT MAXILLOFACIAL WITHOUT CONTRAST CT CERVICAL SPINE WITHOUT CONTRAST TECHNIQUE: Multidetector CT imaging of the head, cervical spine, and maxillofacial structures were performed using the standard protocol without intravenous contrast. Multiplanar CT image reconstructions of the cervical spine and maxillofacial structures were also generated. COMPARISON:  [facility] and cervical spine CT 07/17/2018 FINDINGS: CT HEAD FINDINGS Brain: Stable cerebral volume. No midline shift, mass effect, or evidence of intracranial mass lesion. No ventriculomegaly. Stable gray-white matter differentiation throughout the brain, minimal to mild for age white matter hypodensity. No cortically based acute infarct identified. No acute intracranial hemorrhage identified. Vascular: Calcified atherosclerosis at the skull base. Tortuous intracranial arteries. No suspicious intracranial vascular hyperdensity. Skull: Stable, intact. Other: No acute scalp soft tissue findings. CT MAXILLOFACIAL FINDINGS Osseous: Mandible intact. No maxilla or zygoma fracture. Central skull base intact. Orbits: Orbital walls intact. Symmetric and negative orbits soft tissues. Sinuses: Clear.  Tympanic cavities and mastoids are clear. Soft tissues: Negative visible noncontrast larynx, pharynx, parapharyngeal spaces,  retropharyngeal space, sublingual space, submandibular spaces, parotid and masticator spaces. No definite superficial soft tissue injury. CT CERVICAL SPINE FINDINGS Alignment: Stable straightening of cervical lordosis. Cervicothoracic junction alignment is within normal limits. Bilateral posterior element alignment is within normal limits. Skull base and vertebrae: Visualized skull base is intact. No atlanto-occipital dissociation. No acute osseous abnormality identified. Soft tissues and spinal canal: No prevertebral fluid or swelling. No visible canal hematoma. Negative noncontrast neck soft tissues. Disc levels: Prior C5-C6 ACDF with solid arthrodesis. Stable generally mild for age degenerative changes elsewhere. Advanced chronic T1-T2 disc and endplate degeneration. Upper chest: Visible upper thoracic levels appear stable and intact. Negative lung apices. Negative  noncontrast thoracic inlet. IMPRESSION: 1. No acute traumatic injury identified in the head, face, or cervical spine. 2. Stable and largely negative for age noncontrast CT appearance of the brain. 3. Stable cervical spine, prior C5-C6 ACDF with solid arthrodesis. Electronically Signed   By: Genevie Ann M.D.   On: 10/26/2018 19:24    Procedures Procedures (including critical care time)  Medications Ordered in ED Medications - No data to display   Initial Impression / Assessment and Plan / ED Course  I have reviewed the triage vital signs and the nursing notes.  Pertinent labs & imaging results that were available during my care of the patient were reviewed by me and considered in my medical decision making (see chart for details).        Patient is a well-appearing 83 year old female past medical history of hypertension, aortic stenosis status post TAVR, hyperlipidemia presenting for fall.  She is nontoxic-appearing, hemodynamically stable, and neurologically intact.  Fall occurred yesterday.  She is pain-free currently.  Will obtain head  CT, maxillofacial CT, and cervical spine CT.  Head, maxillofacial, and cervical spine CT without acute abnormalities.  Status post anterior cervical decompression.  Patient feels well and is ambulating without difficulty.  Stable for outpatient follow-up.  Return precautions given for any new or worsening symptoms.  Patient is in understanding and agrees with the plan of care.  This is a shared visit with Fredia Sorrow. Patient was independently evaluated by this attending physician. Attending physician consulted in evaluation and discharge management.  Final Clinical Impressions(s) / ED Diagnoses   Final diagnoses:  Fall, initial encounter  Facial contusion, initial encounter  Injury of head, initial encounter    ED Discharge Orders    None       Tamala Julian 10/26/18 2042    Fredia Sorrow, MD 11/10/18 [phone]

## 2018-10-26 NOTE — Discharge Instructions (Signed)
Follow-up with your primary care doctor.  Had CT of head neck and face without any acute abnormalities.

## 2018-10-26 NOTE — ED Triage Notes (Signed)
Pt reports tripping in parking lot yesterday and hitting face. Pt noted to have bruise to right orbital area. No loss of consciousness. Pt concerned due to slight HA and dizziness
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
## 2018-10-28 ENCOUNTER — Encounter (INDEPENDENT_AMBULATORY_CARE_PROVIDER_SITE_OTHER): Payer: Self-pay | Admitting: Ophthalmology

## 2018-10-28 ENCOUNTER — Other Ambulatory Visit: Payer: Self-pay

## 2018-10-28 ENCOUNTER — Ambulatory Visit (INDEPENDENT_AMBULATORY_CARE_PROVIDER_SITE_OTHER): Payer: Medicare Other | Admitting: Ophthalmology

## 2018-10-28 DIAGNOSIS — Z9889 Other specified postprocedural states: Secondary | ICD-10-CM

## 2018-10-28 DIAGNOSIS — I10 Essential (primary) hypertension: Secondary | ICD-10-CM | POA: Diagnosis not present

## 2018-10-28 DIAGNOSIS — Z961 Presence of intraocular lens: Secondary | ICD-10-CM

## 2018-10-28 DIAGNOSIS — H21231 Degeneration of iris (pigmentary), right eye: Secondary | ICD-10-CM | POA: Diagnosis not present

## 2018-10-28 DIAGNOSIS — H209 Unspecified iridocyclitis: Secondary | ICD-10-CM

## 2018-10-28 DIAGNOSIS — H3581 Retinal edema: Secondary | ICD-10-CM

## 2018-10-28 DIAGNOSIS — H35033 Hypertensive retinopathy, bilateral: Secondary | ICD-10-CM

## 2018-10-28 DIAGNOSIS — H179 Unspecified corneal scar and opacity: Secondary | ICD-10-CM

## 2018-10-28 DIAGNOSIS — Z87828 Personal history of other (healed) physical injury and trauma: Secondary | ICD-10-CM

## 2018-11-25 ENCOUNTER — Encounter: Payer: Self-pay | Admitting: Family Medicine

## 2018-11-25 ENCOUNTER — Ambulatory Visit (INDEPENDENT_AMBULATORY_CARE_PROVIDER_SITE_OTHER): Payer: Medicare Other | Admitting: Family Medicine

## 2018-11-25 VITALS — BP 188/60 | HR 58

## 2018-11-25 DIAGNOSIS — I160 Hypertensive urgency: Secondary | ICD-10-CM | POA: Diagnosis not present

## 2018-11-25 DIAGNOSIS — R42 Dizziness and giddiness: Secondary | ICD-10-CM | POA: Diagnosis not present

## 2018-11-25 NOTE — Progress Notes (Signed)
Telephone visit  Subjective: CC: Dizzy PCP: [name], DO HPI:[name] is a 83 y.o. female calls for telephone consult today. Patient provides verbal consent for consult held via phone.  Location of patient: home Location of provider: Working remotely from home Others present for call: none  1. Dizziness Last Sunday patient reports she started experiencing vertigo.  Yesterday it occurred again at rest.  Her BP was 176/57; HR 58.  No chest pain, shortness of breath.  She is not having dizziness now but blood pressure has remained elevated.  She is compliant with metoprolol twice daily as prescribed.  However, she has not been compliant with losartan.  She does have some old 25 mg tablets in her drawer.   ROS: Per HPI  Allergies  Allergen Reactions  . Tape Other (See Comments)    Dermatitis rash "with extended exposure"  . Prolia [Denosumab] Other (See Comments)    Arthralgia/ myalgia/ jaw pain/ headache   Past Medical History:  Diagnosis Date  . Chronic headaches   . Fibromyalgia   . GERD (gastroesophageal reflux disease)   . Hemorrhoids    INTERNAL--  POST BANDING 02/ 2015  . History of kidney stones   . Hyperlipidemia   . Hypertension   . Moderate aortic stenosis   . OSA (obstructive sleep apnea) CPAP INTOLERANT   . Osteoporosis   . S/P TAVR (transcatheter aortic valve replacement) 10/19/2017   20 mm Edwards Sapien 3 transcatheter heart valve placed via percutaneous right transfemoral approach   . Severe aortic stenosis   . Spinal stenosis, multilevel     Current Outpatient Medications:  .  acetaminophen (TYLENOL) 500 MG tablet, Take 1,000 mg by mouth daily as needed for moderate pain or headache., Disp: , Rfl:  .  ALOE VERA PO, Take 1 capsule by mouth daily., Disp: , Rfl:  .  amoxicillin (AMOXIL) 500 MG tablet, Take 4 tablets by mouth 30-60 minutes prior to dental appointments, Disp: 4 tablet, Rfl: 4 .  Ascorbic Acid (VITAMIN C) 1000 MG tablet, Take 1,000  mg by mouth daily., Disp: , Rfl:  .  aspirin EC 81 MG tablet, Take 1 tablet (81 mg total) by mouth daily., Disp: 90 tablet, Rfl: 3 .  BEE POLLEN PO, Take 400 mg by mouth daily. , Disp: , Rfl:  .  clobetasol cream (TEMOVATE) 5.73 %, APPLY 1 APPLICATION TOPICALLY 2 (TWO) TIMES DAILY AS NEEDED., Disp: 30 g, Rfl: 3 .  Coenzyme Q10 (COQ-10) 100 MG CAPS, Take 100 mg by mouth daily., Disp: , Rfl:  .  losartan (COZAAR) 50 MG tablet, Take 1 tablet (50 mg total) by mouth daily., Disp: 90 tablet, Rfl: 3 .  metoprolol tartrate (LOPRESSOR) 50 MG tablet, Take 1 tablet (50 mg total) by mouth 2 (two) times daily., Disp: 180 tablet, Rfl: 3 .  OVER THE COUNTER MEDICATION, Take 1 capsule by mouth 2 (two) times daily. Hemp Oil, Disp: , Rfl:  .  prednisoLONE acetate (PRED FORTE) 1 % ophthalmic suspension, Place 1 drop into the right eye 4 (four) times daily. (Patient taking differently: Place 1 drop into the right eye 2 (two) times a day. ), Disp: 10 mL, Rfl: 0 .  rosuvastatin (CRESTOR) 5 MG tablet, Take 1 tablet (5 mg total) by mouth daily., Disp: 90 tablet, Rfl: 1  Blood pressure (!) 188/60, pulse (!) 58.  Assessment/ Plan: 83 y.o. female   1. Hypertensive urgency I have advised her to resume use of losartan.  She has  25 mg tablets at home and have instructed her to take 2 tablets to equal 50 mg total.  She is to continue monitoring blood pressures.  She is going to come in on Monday for blood pressure recheck with the nurse.  She will also need to get a BMP given restart of ARB.  We discussed reasons for emergent evaluation emergency department.  She voiced good understanding.  Follow-up as directed - Basic Metabolic Panel; Future  2. Dizziness As above   Start time: 3:22pm End time: 3:37pm  Total time spent on patient care (including telephone call/ virtual visit): 19 minutes  [place], [place] [phone]

## 2018-11-28 ENCOUNTER — Other Ambulatory Visit: Payer: Self-pay

## 2018-11-28 ENCOUNTER — Ambulatory Visit: Payer: Medicare Other | Admitting: *Deleted

## 2018-11-28 DIAGNOSIS — I160 Hypertensive urgency: Secondary | ICD-10-CM

## 2018-11-28 NOTE — Progress Notes (Signed)
Pt here for BP check BP 171 56 P 62 Rck BP 153 55 P 58

## 2018-11-29 LAB — BASIC METABOLIC PANEL
BUN/Creatinine Ratio: 15 (ref 12–28)
BUN: 14 mg/dL (ref 8–27)
CO2: 24 mmol/L (ref 20–29)
Calcium: 9.2 mg/dL (ref 8.7–10.3)
Chloride: 104 mmol/L (ref 96–106)
Creatinine, Ser: 0.92 mg/dL (ref 0.57–1.00)
GFR calc Af Amer: 68 mL/min/{1.73_m2} (ref >59)
GFR calc non Af Amer: 59 mL/min/{1.73_m2} — ABNORMAL LOW (ref >59)
Glucose: 83 mg/dL (ref 65–99)
Potassium: 4.6 mmol/L (ref 3.5–5.2)
Sodium: 142 mmol/L (ref 134–144)

## 2018-12-05 ENCOUNTER — Other Ambulatory Visit: Payer: Self-pay | Admitting: Family Medicine

## 2018-12-08 ENCOUNTER — Ambulatory Visit (INDEPENDENT_AMBULATORY_CARE_PROVIDER_SITE_OTHER): Payer: Medicare Other | Admitting: *Deleted

## 2018-12-08 VITALS — Ht 64.0 in | Wt 160.9 lb

## 2018-12-08 DIAGNOSIS — Z0000 Encounter for general adult medical examination without abnormal findings: Secondary | ICD-10-CM | POA: Diagnosis not present

## 2018-12-08 NOTE — Patient Instructions (Signed)
[name] , Thank you for taking time to come for your Medicare Wellness Visit. I appreciate your ongoing commitment to your health goals. Please review the following plan we discussed and let me know if I can assist you in the future.   These are the goals we discussed: Goals    . Exercise 3x per week (30 min per time)       This is a list of the screening recommended for you and due dates:  Health Maintenance  Topic Date Due  . Tetanus Vaccine  09/03/1954  . Pneumonia vaccines (1 of 2 - PCV13) 09/04/2002  . Mammogram  11/04/2018  . DEXA scan (bone density measurement)  12/11/2018  . Flu Shot  Discontinued     Preventive Care 65 Years and Older, Female Preventive care refers to lifestyle choices and visits with your health care provider that can promote health and wellness. This includes:  A yearly physical exam. This is also called an annual well check.  Regular dental and eye exams.  Immunizations.  Screening for certain conditions.  Healthy lifestyle choices, such as diet and exercise. What can I expect for my preventive care visit? Physical exam Your health care provider will check:  Height and weight. These may be used to calculate body mass index (BMI), which is a measurement that tells if you are at a healthy weight.  Heart rate and blood pressure.  Your skin for abnormal spots. Counseling Your health care provider may ask you questions about:  Alcohol, tobacco, and drug use.  Emotional well-being.  Home and relationship well-being.  Sexual activity.  Eating habits.  History of falls.  Memory and ability to understand (cognition).  Work and work Statistician.  Pregnancy and menstrual history. What immunizations do I need?  Influenza (flu) vaccine  This is recommended every year. Tetanus, diphtheria, and pertussis (Tdap) vaccine  You may need a Td booster every 10 years. Varicella (chickenpox) vaccine  You may need this vaccine if you have  not already been vaccinated. Zoster (shingles) vaccine  You may need this after age 30. Pneumococcal conjugate (PCV13) vaccine  One dose is recommended after age 28. Pneumococcal polysaccharide (PPSV23) vaccine  One dose is recommended after age 34. Measles, mumps, and rubella (MMR) vaccine  You may need at least one dose of MMR if you were born in 1957 or later. You may also need a second dose. Meningococcal conjugate (MenACWY) vaccine  You may need this if you have certain conditions. Hepatitis A vaccine  You may need this if you have certain conditions or if you travel or work in places where you may be exposed to hepatitis A. Hepatitis B vaccine  You may need this if you have certain conditions or if you travel or work in places where you may be exposed to hepatitis B. Haemophilus influenzae type b (Hib) vaccine  You may need this if you have certain conditions. You may receive vaccines as individual doses or as more than one vaccine together in one shot (combination vaccines). Talk with your health care provider about the risks and benefits of combination vaccines. What tests do I need? Blood tests  Lipid and cholesterol levels. These may be checked every 5 years, or more frequently depending on your overall health.  Hepatitis C test.  Hepatitis B test. Screening  Lung cancer screening. You may have this screening every year starting at age 43 if you have a 30-pack-year history of smoking and currently smoke or have quit  within the past 15 years.  Colorectal cancer screening. All adults should have this screening starting at age 80 and continuing until age 83. Your health care provider may recommend screening at age 53 if you are at increased risk. You will have tests every 1-10 years, depending on your results and the type of screening test.  Diabetes screening. This is done by checking your blood sugar (glucose) after you have not eaten for a while (fasting). You may  have this done every 1-3 years.  Mammogram. This may be done every 1-2 years. Talk with your health care provider about how often you should have regular mammograms.  BRCA-related cancer screening. This may be done if you have a family history of breast, ovarian, tubal, or peritoneal cancers. Other tests  Sexually transmitted disease (STD) testing.  Bone density scan. This is done to screen for osteoporosis. You may have this done starting at age 39. Follow these instructions at home: Eating and drinking  Eat a diet that includes fresh fruits and vegetables, whole grains, lean protein, and low-fat dairy products. Limit your intake of foods with high amounts of sugar, saturated fats, and salt.  Take vitamin and mineral supplements as recommended by your health care provider.  Do not drink alcohol if your health care provider tells you not to drink.  If you drink alcohol: ? Limit how much you have to 0-1 drink a day. ? Be aware of how much alcohol is in your drink. In the [place] one drink equals one 12 oz bottle of beer (355 mL), one 5 oz glass of wine (148 mL), or one 1 oz glass of hard liquor (44 mL). Lifestyle  Take daily care of your teeth and gums.  Stay active. Exercise for at least 30 minutes on 5 or more days each week.  Do not use any products that contain nicotine or tobacco, such as cigarettes, e-cigarettes, and chewing tobacco. If you need help quitting, ask your health care provider.  If you are sexually active, practice safe sex. Use a condom or other form of protection in order to prevent STIs (sexually transmitted infections).  Talk with your health care provider about taking a low-dose aspirin or statin. What's next?  Go to your health care provider once a year for a well check visit.  Ask your health care provider how often you should have your eyes and teeth checked.  Stay up to date on all vaccines. This information is not intended to replace advice given to  you by your health care provider. Make sure you discuss any questions you have with your health care provider. Document Released: 04/26/2015 Document Revised: 03/24/2018 Document Reviewed: 03/24/2018 Elsevier Patient Education  2020 [employer].
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
## 2018-12-08 NOTE — Progress Notes (Signed)
MEDICARE ANNUAL WELLNESS VISIT  12/08/2018  Telephone Visit Disclaimer This Medicare AWV was conducted by telephone due to national recommendations for restrictions regarding the COVID-19 Pandemic (e.g. social distancing).  I verified, using two identifiers, that I am speaking with [name] or their authorized healthcare agent. I discussed the limitations, risks, security, and privacy concerns of performing an evaluation and management service by telephone and the potential availability of an in-person appointment in the future. The patient expressed understanding and agreed to proceed.   Subjective:  [name] is a 83 y.o. female patient of Megan Norlander, DO who had a Medicare Annual Wellness Visit today via telephone. Kelsa is Retired and lives with their family. she has 3 biological children and 3 adopted children. she reports that she is socially active and does interact with friends/family regularly. she is minimally physically active and enjoys music.  Patient Care Team: Megan Norlander, DO as PCP - General (Family Medicine) [name], MD as PCP - Cardiology (Cardiology) Chipper Herb, MD as Attending Physician (Family Medicine) [name], MD as Consulting Physician (Cardiology) Inda Castle, MD (Inactive) as Consulting Physician (Gastroenterology) Aplington, Laurice Record, MD (Inactive) as Consulting Physician (Orthopedic Surgery) Clearnce Sorrel, MD as Referring Physician (Neurology) Rutherford Guys, MD as Consulting Physician (Ophthalmology)  Advanced Directives 12/08/2018 10/19/2017 10/15/2017 10/11/2017 09/29/2017 04/21/2016 07/26/2014  Does Patient Have a Medical Advance Directive? Yes Yes Yes Yes Yes Yes No  Type of Paramedic of [place];Living will [place];Living will [place];Living will [place];Living will [place];Living will [place];Living will -  Does patient want to make changes to medical advance directive? No - Patient declined No - Patient declined No - Patient declined No - Patient declined No - Patient declined - -  Copy of [place] in Chart? No - copy requested No - copy requested No - copy requested No - copy requested Yes No - copy requested -  Would patient like information on creating a medical advance directive? - - - - - - Yes - Educational materials given  Pre-existing out of facility DNR order (yellow form or pink MOST form) - - - - - - -    Hospital Utilization Over the Past 12 Months: # of hospitalizations or ER visits: 2 # of surgeries: 0  Review of Systems    Patient reports that her overall health is unchanged compared to last year.  No complaints  Patient Reported Readings (BP, Pulse, CBG, Weight, etc) none  Pain Assessment Pain : No/denies pain     Current Medications & Allergies (verified) Allergies as of 12/08/2018      Reactions   Tape Other (See Comments)   Dermatitis rash "with extended exposure"   Prolia [denosumab] Other (See Comments)   Arthralgia/ myalgia/ jaw pain/ headache      Medication List       Accurate as of December 08, 2018  3:51 PM. If you have any questions, ask your nurse or doctor.        acetaminophen 500 MG tablet Commonly known as: TYLENOL Take 1,000 mg by mouth daily as needed for moderate pain or headache.   ALOE VERA PO Take 1 capsule by mouth daily.   amoxicillin 500 MG tablet Commonly known as: AMOXIL Take 4 tablets by mouth 30-60 minutes prior to dental appointments   aspirin EC 81  MG tablet Take 1 tablet (81 mg total) by mouth daily.   BEE POLLEN PO Take 400 mg by mouth daily.   clobetasol cream 0.05 % Commonly known as: TEMOVATE APPLY 1 APPLICATION TOPICALLY 2 (TWO) TIMES DAILY AS NEEDED.   CoQ-10 100 MG Caps Take 100 mg by mouth daily.   losartan 50 MG tablet Commonly known as: COZAAR Take 1  tablet (50 mg total) by mouth daily.   metoprolol tartrate 50 MG tablet Commonly known as: LOPRESSOR Take 1 tablet (50 mg total) by mouth 2 (two) times daily.   OVER THE COUNTER MEDICATION Take 1 capsule by mouth 2 (two) times daily. Hemp Oil   prednisoLONE acetate 1 % ophthalmic suspension Commonly known as: PRED FORTE Place 1 drop into the right eye 4 (four) times daily. What changed: when to take this   rosuvastatin 5 MG tablet Commonly known as: CRESTOR Take 1 tablet (5 mg total) by mouth daily. (Needs to be seen before next refill)   vitamin C 1000 MG tablet Take 1,000 mg by mouth daily.       History (reviewed): Past Medical History:  Diagnosis Date  . Chronic headaches   . Fibromyalgia   . GERD (gastroesophageal reflux disease)   . Hemorrhoids    INTERNAL--  POST BANDING 02/ 2015  . History of kidney stones   . Hyperlipidemia   . Hypertension   . Moderate aortic stenosis   . OSA (obstructive sleep apnea) CPAP INTOLERANT   . Osteoporosis   . S/P TAVR (transcatheter aortic valve replacement) 10/19/2017   20 mm Edwards Sapien 3 transcatheter heart valve placed via percutaneous right transfemoral approach   . Severe aortic stenosis   . Spinal stenosis, multilevel    Past Surgical History:  Procedure Laterality Date  . ANTERIOR CERVICAL DECOMP/DISCECTOMY FUSION  11-11-1999   C5 - C6  . APPENDECTOMY  AGE 54  . CARDIAC CATHETERIZATION  2008  [facility]   ESSENTIALLY NORMAL  . CATARACT EXTRACTION Bilateral   . CATARACT EXTRACTION W/ INTRAOCULAR LENS  IMPLANT, BILATERAL  2012  . EYE SURGERY    . FLEXIBLE SIGMOIDOSCOPY N/A 06/01/2013   Procedure: FLEXIBLE SIGMOIDOSCOPY;  Surgeon: Inda Castle, MD;  Location: [facility];  Service: Endoscopy;  Laterality: N/A;  may need hemorrhoidal banding  . HEMORRHOIDECTOMY WITH HEMORRHOID BANDING  08-07-2010  . LAPAROSCOPIC CHOLECYSTECTOMY  1990  . RIGHT/LEFT HEART CATH AND CORONARY ANGIOGRAPHY N/A 09/29/2017   Procedure:  RIGHT/LEFT HEART CATH AND CORONARY ANGIOGRAPHY;  Surgeon: Sherren Mocha, MD;  Location: [place] [facility];  Service: Cardiovascular;  Laterality: N/A;  . SHOULDER ARTHROSCOPY WITH OPEN ROTATOR CUFF REPAIR AND DISTAL CLAVICLE ACROMINECTOMY Right 05/25/2012   Procedure: SHOULDER ARTHROSCOPY WITH OPEN ROTATOR CUFF REPAIR AND DISTAL CLAVICLE ACROMINECTOMY;  Surgeon: Magnus Sinning, MD;  Location: [place];  Service: Orthopedics;  Laterality: Right;  RIGHT SHOULDER ARTHROSCOPY WITH DERBRIDEMENTOF LABRAL/BICEP TENDON, OPEN DISTAL CLAVICLE RESECTION, ANTERIOR ACROMINECTOMY ROTATOR CUFF REPAIR ANESTHESIA: GENERAL/SCALENE NERVE BLOCK  . TEE WITHOUT CARDIOVERSION Bilateral 10/19/2017   Procedure: TRANSESOPHAGEAL ECHOCARDIOGRAM (TEE);  Surgeon: Sherren Mocha, MD;  Location: [place];  Service: Open Heart Surgery;  Laterality: Bilateral;  . TENOSYNOVECTOMY Left 01/04/2014   Procedure: LEFT WRIST EXTENSOR TENOSYNOVECTOMY;  Surgeon: Linna Hoff, MD;  Location: [facility];  Service: Orthopedics;  Laterality: Left;  . THYROIDECTOMY  AGE 47   GOITER  . TONSILLECTOMY AND ADENOIDECTOMY  AGE 47  . TRANSCATHETER AORTIC VALVE REPLACEMENT, TRANSFEMORAL  10/19/2017  .  TRANSCATHETER AORTIC VALVE REPLACEMENT, TRANSFEMORAL Bilateral 10/19/2017   Procedure: TRANSCATHETER AORTIC VALVE REPLACEMENT, TRANSFEMORAL;  Surgeon: Sherren Mocha, MD;  Location: [place];  Service: Open Heart Surgery;  Laterality: Bilateral;  . TRANSTHORACIC ECHOCARDIOGRAM  last one 04-20-2013  DR [facility]    NORMAL LVSF/ EF 60-65%/ MODERATE  AV  STENOSIS WITH NO AR /  MILD LAE  . UMBILICAL HERNIA REPAIR  JUNE 2006  . VAGINAL HYSTERECTOMY  01-31-2001   ANTERIOR & POSTERIOR REPAIR/ TRANSVAGINAL BLADDER SLING   Family History  Problem Relation Age of Onset  . Cirrhosis Mother        drinking  . Other Brother        duodenal ulcer  . Heart disease Sister   . Cancer Son 61       colon  . Gallbladder disease Maternal  Grandmother   . Stomach cancer Paternal Grandfather    Social History   Socioeconomic History  . Marital status: Married    Spouse name: Shanon Brow  . Number of children: 6  . Years of education: 6  . Highest education level: 6th grade  Occupational History  . Occupation: Retired  Scientific laboratory technician  . Financial resource strain: Not hard at all  . Food insecurity    Worry: Never true    Inability: Never true  . Transportation needs    Medical: No    Non-medical: No  Tobacco Use  . Smoking status: Former Smoker    Packs/day: 0.25    Years: 5.00    Pack years: 1.25    Types: Cigarettes    Quit date: 04/14/2003    Years since quitting: 15.6  . Smokeless tobacco: Never Used  Substance and Sexual Activity  . Alcohol use: Yes    Comment: once in a while-social  . Drug use: Never    Comment: hemp oil   . Sexual activity: Not Currently  Lifestyle  . Physical activity    Days per week: 0 days    Minutes per session: 0 min  . Stress: Not at all  Relationships  . Social connections    Talks on phone: More than three times a week    Gets together: Once a week    Attends religious service: More than 4 times per year    Active member of club or organization: Yes    Attends meetings of clubs or organizations: More than 4 times per year    Relationship status: Married  Other Topics Concern  . Not on file  Social History Narrative  . Not on file    Activities of Daily Living In your present state of health, do you have any difficulty performing the following activities: 12/08/2018  Hearing? N  Vision? Y  Difficulty concentrating or making decisions? N  Walking or climbing stairs? N  Dressing or bathing? N  Doing errands, shopping? N  Preparing Food and eating ? N  Using the Toilet? N  In the past six months, have you accidently leaked urine? N  Do you have problems with loss of bowel control? N  Managing your Medications? N  Managing your Finances? N  Housekeeping or managing  your Housekeeping? N  Some recent data might be hidden    Patient Education/ Literacy How often do you need to have someone help you when you read instructions, pamphlets, or other written materials from your doctor or pharmacy?: 1 - Never What is the last grade level you completed in school?: 6th Grade  Exercise Current  Exercise Habits: The patient does not participate in regular exercise at present, Exercise limited by: None identified  Diet Patient reports consuming 2 meals a day and 3 snack(s) a day Patient reports that her primary diet is: Regular Patient reports that she does have regular access to food.   Depression Screen PHQ 2/9 Scores 12/08/2018 05/02/2018 01/12/2018 11/01/2017 10/27/2017 08/25/2017 08/05/2017  PHQ - 2 Score 0 0 0 0 0 0 0     Fall Risk Fall Risk  12/08/2018 05/02/2018 01/12/2018 11/01/2017 10/27/2017  Falls in the past year? 1 0 No No No  Number falls in past yr: 1 - - - -  Injury with Fall? 1 - - - -  Risk for fall due to : History of fall(s);Impaired balance/gait;Impaired mobility - - - -  Risk for fall due to: Comment - - - - -     Objective:  [name] seemed alert and oriented and she participated appropriately during our telephone visit.  Blood Pressure Weight BMI  BP Readings from Last 3 Encounters:  11/25/18 (!) 188/60  10/26/18 (!) 165/44  09/19/18 (!) 151/52   Wt Readings from Last 3 Encounters:  12/08/18 160 lb 15 oz (73 kg)  09/19/18 161 lb (73 kg)  09/14/18 158 lb (71.7 kg)   BMI Readings from Last 1 Encounters:  12/08/18 27.62 kg/m    *Unable to obtain current vital signs, weight, and BMI due to telephone visit type  Hearing/Vision  . Khylie did not seem to have difficulty with hearing/understanding during the telephone conversation . Reports that she has had a formal eye exam by an eye care professional within the past year . Reports that she has not had a formal hearing evaluation within the past year *Unable to fully assess hearing  and vision during telephone visit type  Cognitive Function: 6CIT Screen 12/08/2018  What Year? 0 points  What month? 0 points  What time? 0 points  Count back from 20 0 points  Months in reverse 0 points  Repeat phrase 0 points  Total Score 0   (Normal:0-7, Significant for Dysfunction: >8)  Normal Cognitive Function Screening: Yes   Immunization & Health Maintenance Record Immunization History  Administered Date(s) Administered  . Influenza,inj,Quad PF,6+ Mos 02/01/2013    Health Maintenance  Topic Date Due  . TETANUS/TDAP  09[birth year]03/1956  . PNA vac Low Risk Adult (1 of 2 - PCV13) 09/04/2002  . MAMMOGRAM  11/04/2018  . DEXA SCAN  12/11/2018  . INFLUENZA VACCINE  Discontinued       Assessment  This is a routine wellness examination for [employer].  Health Maintenance: Due or Overdue Health Maintenance Due  Topic Date Due  . TETANUS/TDAP  09[birth year]03/1954  . PNA vac Low Risk Adult (1 of 2 - PCV13) 09/04/2002  . MAMMOGRAM  11/04/2018    [name] does not need a referral for Community Assistance: Care Management:   no Social Work:    no Prescription Assistance:  no Nutrition/Diabetes Education:  no   Plan:  Personalized Goals Goals Addressed   None    Personalized Health Maintenance & Screening Recommendations  Pneumococcal vaccine  Influenza vaccine Td vaccine  Lung Cancer Screening Recommended: no (Low Dose CT Chest recommended if Age 58-80 years, 30 pack-year currently smoking OR have quit w/in past 15 years) Hepatitis C Screening recommended: no HIV Screening recommended: no  Advanced Directives: Written information was not prepared per patient's request.  Referrals & Orders No orders of  the defined types were placed in this encounter.   Follow-up Plan . Follow-up with Megan Norlander, DO as planned   I have personally reviewed and noted the following in the patient's chart:   . Medical and social history . Use of alcohol, tobacco or illicit  drugs  . Current medications and supplements . Functional ability and status . Nutritional status . Physical activity . Advanced directives . List of other physicians . Hospitalizations, surgeries, and ER visits in previous 12 months . Vitals . Screenings to include cognitive, depression, and falls . Referrals and appointments  In addition, I have reviewed and discussed with [name] certain preventive protocols, quality metrics, and best practice recommendations. A written personalized care plan for preventive services as well as general preventive health recommendations is available and can be mailed to the patient at her request.      Wardell Heath, LPN  [id number]

## 2018-12-09 ENCOUNTER — Encounter (INDEPENDENT_AMBULATORY_CARE_PROVIDER_SITE_OTHER): Payer: Self-pay | Admitting: Ophthalmology

## 2018-12-09 ENCOUNTER — Ambulatory Visit (INDEPENDENT_AMBULATORY_CARE_PROVIDER_SITE_OTHER): Payer: Medicare Other | Admitting: Ophthalmology

## 2018-12-09 ENCOUNTER — Other Ambulatory Visit: Payer: Self-pay

## 2018-12-09 DIAGNOSIS — Z961 Presence of intraocular lens: Secondary | ICD-10-CM

## 2018-12-09 DIAGNOSIS — H3581 Retinal edema: Secondary | ICD-10-CM | POA: Diagnosis not present

## 2018-12-09 DIAGNOSIS — Z87828 Personal history of other (healed) physical injury and trauma: Secondary | ICD-10-CM

## 2018-12-09 DIAGNOSIS — Z9889 Other specified postprocedural states: Secondary | ICD-10-CM

## 2018-12-09 DIAGNOSIS — H179 Unspecified corneal scar and opacity: Secondary | ICD-10-CM

## 2018-12-09 DIAGNOSIS — I10 Essential (primary) hypertension: Secondary | ICD-10-CM

## 2018-12-09 DIAGNOSIS — H209 Unspecified iridocyclitis: Secondary | ICD-10-CM | POA: Diagnosis not present

## 2018-12-09 DIAGNOSIS — H35033 Hypertensive retinopathy, bilateral: Secondary | ICD-10-CM

## 2018-12-09 DIAGNOSIS — H21231 Degeneration of iris (pigmentary), right eye: Secondary | ICD-10-CM | POA: Diagnosis not present

## 2018-12-26 ENCOUNTER — Other Ambulatory Visit: Payer: Self-pay

## 2018-12-27 ENCOUNTER — Encounter: Payer: Self-pay | Admitting: Family Medicine

## 2018-12-27 ENCOUNTER — Ambulatory Visit (INDEPENDENT_AMBULATORY_CARE_PROVIDER_SITE_OTHER): Payer: Medicare Other | Admitting: Family Medicine

## 2018-12-27 VITALS — BP 137/47 | HR 61 | Temp 98.4°F | Resp 16 | Ht 64.0 in | Wt 161.8 lb

## 2018-12-27 DIAGNOSIS — L02214 Cutaneous abscess of groin: Secondary | ICD-10-CM | POA: Diagnosis not present

## 2018-12-27 DIAGNOSIS — I10 Essential (primary) hypertension: Secondary | ICD-10-CM

## 2018-12-27 MED ORDER — MUPIROCIN 2 % EX OINT: TOPICAL_OINTMENT | CUTANEOUS | 0 refills | Status: DC

## 2018-12-27 MED ORDER — LOSARTAN POTASSIUM 50 MG PO TABS: 50.0000 mg | ORAL_TABLET | Freq: Every day | ORAL | 3 refills | Status: DC

## 2018-12-27 NOTE — Patient Instructions (Signed)

## 2018-12-27 NOTE — Progress Notes (Signed)
Subjective: CC: Follow-up hypertension PCP: [name], DO HPI:[name] is a 83 y.o. female presenting to clinic today for:  1.  Hypertension Patient reports that she has been doing well on metoprolol and Cozaar.  No chest pain, shortness of breath, lower extremity edema or dizziness.  Blood pressures have been running fairly good at home.  2.  Skin lesion Patient reports a lesion on her left inner groin that has been present for several days.  She notes that yesterday when she touched it there was some fluid leaking from it.  She notes that it is tender to palpation.  She has been using colloidal silver in efforts to treat it.   ROS: Per HPI  Allergies  Allergen Reactions  . Tape Other (See Comments)    Dermatitis rash "with extended exposure"  . Prolia [Denosumab] Other (See Comments)    Arthralgia/ myalgia/ jaw pain/ headache   Past Medical History:  Diagnosis Date  . Chronic headaches   . Fibromyalgia   . GERD (gastroesophageal reflux disease)   . Hemorrhoids    INTERNAL--  POST BANDING 02/ 2015  . History of kidney stones   . Hyperlipidemia   . Hypertension   . Moderate aortic stenosis   . OSA (obstructive sleep apnea) CPAP INTOLERANT   . Osteoporosis   . S/P TAVR (transcatheter aortic valve replacement) 10/19/2017   20 mm Edwards Sapien 3 transcatheter heart valve placed via percutaneous right transfemoral approach   . Severe aortic stenosis   . Spinal stenosis, multilevel     Current Outpatient Medications:  .  acetaminophen (TYLENOL) 500 MG tablet, Take 1,000 mg by mouth daily as needed for moderate pain or headache., Disp: , Rfl:  .  ALOE VERA PO, Take 1 capsule by mouth daily., Disp: , Rfl:  .  Ascorbic Acid (VITAMIN C) 1000 MG tablet, Take 1,000 mg by mouth daily., Disp: , Rfl:  .  aspirin EC 81 MG tablet, Take 1 tablet (81 mg total) by mouth daily., Disp: 90 tablet, Rfl: 3 .  BEE POLLEN PO, Take 400 mg by mouth daily. , Disp: , Rfl:  .   clobetasol cream (TEMOVATE) [id number] %, APPLY 1 APPLICATION TOPICALLY 2 (TWO) TIMES DAILY AS NEEDED., Disp: 30 g, Rfl: 3 .  Coenzyme Q10 (COQ-10) 100 MG CAPS, Take 100 mg by mouth daily., Disp: , Rfl:  .  losartan (COZAAR) 50 MG tablet, Take 1 tablet (50 mg total) by mouth daily., Disp: 90 tablet, Rfl: 3 .  metoprolol tartrate (LOPRESSOR) 50 MG tablet, Take 1 tablet (50 mg total) by mouth 2 (two) times daily., Disp: 180 tablet, Rfl: 3 .  OVER THE COUNTER MEDICATION, Take 1 capsule by mouth 2 (two) times daily. Hemp Oil, Disp: , Rfl:  .  prednisoLONE acetate (PRED FORTE) 1 % ophthalmic suspension, Place 1 drop into the right eye 4 (four) times daily. (Patient taking differently: Place 1 drop into the right eye 2 (two) times a day. ), Disp: 10 mL, Rfl: 0 .  rosuvastatin (CRESTOR) 5 MG tablet, Take 1 tablet (5 mg total) by mouth daily. (Needs to be seen before next refill), Disp: 30 tablet, Rfl: 0 .  amoxicillin (AMOXIL) 500 MG tablet, Take 4 tablets by mouth 30-60 minutes prior to dental appointments (Patient not taking: Reported on 12/27/2018), Disp: 4 tablet, Rfl: 4 Social History   Socioeconomic History  . Marital status: Married    Spouse name: Shanon Brow  . Number of children: 6  .  Years of education: 6  . Highest education level: 6th grade  Occupational History  . Occupation: Retired  Scientific laboratory technician  . Financial resource strain: Not hard at all  . Food insecurity    Worry: Never true    Inability: Never true  . Transportation needs    Medical: No    Non-medical: No  Tobacco Use  . Smoking status: Former Smoker    Packs/day: 0.25    Years: 5.00    Pack years: 1.25    Types: Cigarettes    Quit date: 04/14/2003    Years since quitting: 15.7  . Smokeless tobacco: Never Used  Substance and Sexual Activity  . Alcohol use: Yes    Comment: once in a while-social  . Drug use: Never    Comment: hemp oil   . Sexual activity: Not Currently  Lifestyle  . Physical activity    Days per week: 0 days     Minutes per session: 0 min  . Stress: Not at all  Relationships  . Social connections    Talks on phone: More than three times a week    Gets together: Once a week    Attends religious service: More than 4 times per year    Active member of club or organization: Yes    Attends meetings of clubs or organizations: More than 4 times per year    Relationship status: Married  . Intimate partner violence    Fear of current or ex partner: No    Emotionally abused: No    Physically abused: No    Forced sexual activity: No  Other Topics Concern  . Not on file  Social History Narrative  . Not on file   Family History  Problem Relation Age of Onset  . Cirrhosis Mother        drinking  . Other Brother        duodenal ulcer  . Heart disease Sister   . Cancer Son 2       colon  . Gallbladder disease Maternal Grandmother   . Stomach cancer Paternal Grandfather     Objective: Office vital signs reviewed. BP (!) 137/47   Pulse 61   Temp 98.4 F (36.9 C) (Temporal)   Resp 16   Ht 5\' 4"  (1.626 m)   Wt 161 lb 12.8 oz (73.4 kg)   SpO2 97%   BMI 27.77 kg/m   Physical Examination:  General: Awake, alert, her color is somewhat gray, No acute distress Cardio: regular rate and rhythm, S1S2 heard, she has a systolic murmur noted. Pulm: clear to auscultation bilaterally, no wheezes, rhonchi or rales; normal work of breathing on room air Skin: She has a very small draining lesion noted along the left inner groin area near the pubic area.  There is no significant erythema.  Very mild induration.  No palpable fluctuance.  Lesion is somewhat tender to palpation.  Assessment/ Plan: 83 y.o. female   1. Essential hypertension Under much better control with the addition of losartan back.  Check BMP given reinitiation of ARB.  I have renewed the losartan to 50 mg so that she can take 1 single tablet rather than to 25 mg tablets.  She is to continue monitoring blood pressures closely.  She  will follow-up PRN - Basic Metabolic Panel  2. Abscess of groin, left Is actively draining, which is essentially the treatment for the small abscess.  However, will empirically cover with topical antibiotic given area of lesion.  We discussed that if lesion becomes larger, more erythematous or more painful, can transition over to oral antibiotics.  Advised to keep area clean. - mupirocin ointment (BACTROBAN) 2 %; Apply to the affected area twice daily for 7-10 days  Dispense: 22 g; Refill: 0   No orders of the defined types were placed in this encounter.  No orders of the defined types were placed in this encounter.    [name], DO [place] [phone]

## 2018-12-28 LAB — BASIC METABOLIC PANEL
BUN/Creatinine Ratio: 21 (ref 12–28)
BUN: 18 mg/dL (ref 8–27)
CO2: 27 mmol/L (ref 20–29)
Calcium: 9.4 mg/dL (ref 8.7–10.3)
Chloride: 104 mmol/L (ref 96–106)
Creatinine, Ser: 0.87 mg/dL (ref 0.57–1.00)
GFR calc Af Amer: 72 mL/min/{1.73_m2} (ref >59)
GFR calc non Af Amer: 63 mL/min/{1.73_m2} (ref >59)
Glucose: 92 mg/dL (ref 65–99)
Potassium: 4.7 mmol/L (ref 3.5–5.2)
Sodium: 143 mmol/L (ref 134–144)

## 2019-01-18 ENCOUNTER — Other Ambulatory Visit: Payer: Self-pay | Admitting: Family Medicine

## 2019-01-18 NOTE — Telephone Encounter (Signed)
Last cholesterol check 05/2017

## 2019-01-24 DIAGNOSIS — Z1231 Encounter for screening mammogram for malignant neoplasm of breast: Secondary | ICD-10-CM | POA: Diagnosis not present

## 2019-02-06 DIAGNOSIS — I781 Nevus, non-neoplastic: Secondary | ICD-10-CM | POA: Diagnosis not present

## 2019-02-06 DIAGNOSIS — I788 Other diseases of capillaries: Secondary | ICD-10-CM | POA: Diagnosis not present

## 2019-02-06 DIAGNOSIS — D225 Melanocytic nevi of trunk: Secondary | ICD-10-CM | POA: Diagnosis not present

## 2019-02-10 ENCOUNTER — Other Ambulatory Visit: Payer: Self-pay

## 2019-02-10 ENCOUNTER — Ambulatory Visit (INDEPENDENT_AMBULATORY_CARE_PROVIDER_SITE_OTHER): Payer: Medicare Other | Admitting: Family Medicine

## 2019-02-10 ENCOUNTER — Encounter: Payer: Self-pay | Admitting: Family Medicine

## 2019-02-10 VITALS — BP 139/49 | HR 61 | Temp 96.8°F | Ht 64.0 in | Wt 164.4 lb

## 2019-02-10 DIAGNOSIS — M7552 Bursitis of left shoulder: Secondary | ICD-10-CM | POA: Diagnosis not present

## 2019-02-10 MED ORDER — METHYLPREDNISOLONE ACETATE 80 MG/ML IJ SUSP
80.0000 mg | Freq: Once | INTRAMUSCULAR | Status: AC
  Administered 2019-02-10: 80 mg via INTRAMUSCULAR

## 2019-02-10 NOTE — Progress Notes (Signed)
BP (!) 139/49   Pulse 61   Temp (!) 96.8 F (36 C) (Temporal)   Ht 5\' 4"  (1.626 m)   Wt 164 lb 6.4 oz (74.6 kg)   SpO2 97%   BMI 28.22 kg/m    Subjective:   Patient ID: [name], female    DOB: 11-14-1935, 83 y.o.   MRN: [id number]  HPI: [name] is a 83 y.o. female presenting on 02/10/2019 for Shoulder Pain (left x 2 weeks on and off)   HPI Patient is coming in with left shoulder pain is been off and on over the past 2 weeks.  She says she was reaching for something and she felt something catch in there and ever since then it has been off and on and will be better and then worse and then better and then worse over the past couple weeks.  Only certain movements make it hurt.  She denies any numbness or weakness or pain in that shoulder or anywhere else.  She does just over the past day start getting some pain coming up on top of the shoulder and into her neck because of tightness.  She denies any weakness or numbness.  Relevant past medical, surgical, family and social history reviewed and updated as indicated. Interim medical history since our last visit reviewed. Allergies and medications reviewed and updated.  Review of Systems  Constitutional: Negative for chills and fever.  Eyes: Negative for visual disturbance.  Respiratory: Negative for chest tightness and shortness of breath.   Cardiovascular: Negative for chest pain and leg swelling.  Musculoskeletal: Positive for arthralgias. Negative for back pain and gait problem.  Skin: Negative for color change and rash.  Neurological: Negative for light-headedness and headaches.  Psychiatric/Behavioral: Negative for agitation and behavioral problems.  All other systems reviewed and are negative.   Per HPI unless specifically indicated above   Allergies as of 02/10/2019      Reactions   Tape Other (See Comments)   Dermatitis rash "with extended exposure"   Prolia [denosumab] Other (See Comments)   Arthralgia/ myalgia/  jaw pain/ headache      Medication List       Accurate as of February 10, 2019  5:08 PM. If you have any questions, ask your nurse or doctor.        STOP taking these medications   amoxicillin 500 MG tablet Commonly known as: AMOXIL Stopped by: Fransisca Kaufmann Naidelyn Parrella, MD     TAKE these medications   acetaminophen 500 MG tablet Commonly known as: TYLENOL Take 1,000 mg by mouth daily as needed for moderate pain or headache.   ALOE VERA PO Take 1 capsule by mouth daily.   aspirin EC 81 MG tablet Take 1 tablet (81 mg total) by mouth daily.   BEE POLLEN PO Take 400 mg by mouth daily.   clobetasol cream 0.05 % Commonly known as: TEMOVATE APPLY 1 APPLICATION TOPICALLY 2 (TWO) TIMES DAILY AS NEEDED.   CoQ-10 100 MG Caps Take 100 mg by mouth daily.   losartan 50 MG tablet Commonly known as: COZAAR Take 1 tablet (50 mg total) by mouth daily.   metoprolol tartrate 50 MG tablet Commonly known as: LOPRESSOR Take 1 tablet (50 mg total) by mouth 2 (two) times daily.   mupirocin ointment 2 % Commonly known as: Bactroban Apply to the affected area twice daily for 7-10 days   OVER THE COUNTER MEDICATION Take 1 capsule by mouth 2 (two) times daily. Hemp  Oil   prednisoLONE acetate 1 % ophthalmic suspension Commonly known as: PRED FORTE Place 1 drop into the right eye 4 (four) times daily. What changed: when to take this   rosuvastatin 5 MG tablet Commonly known as: CRESTOR Take 1 tablet (5 mg total) by mouth daily.   vitamin C 1000 MG tablet Take 1,000 mg by mouth daily.        Objective:   BP (!) 139/49   Pulse 61   Temp (!) 96.8 F (36 C) (Temporal)   Ht 5\' 4"  (1.626 m)   Wt 164 lb 6.4 oz (74.6 kg)   SpO2 97%   BMI 28.22 kg/m   Wt Readings from Last 3 Encounters:  02/10/19 164 lb 6.4 oz (74.6 kg)  12/27/18 161 lb 12.8 oz (73.4 kg)  12/08/18 160 lb 15 oz (73 kg)    Physical Exam Vitals signs and nursing note reviewed.  Constitutional:      General: She  is not in acute distress.    Appearance: She is well-developed. She is not diaphoretic.  Eyes:     Conjunctiva/sclera: Conjunctivae normal.  Musculoskeletal:     Left shoulder: She exhibits normal range of motion, no tenderness, no effusion, no crepitus, no deformity, normal pulse and normal strength.       Arms:  Skin:    General: Skin is warm and dry.     Findings: No rash.  Neurological:     Mental Status: She is alert and oriented to person, place, and time.     Coordination: Coordination normal.  Psychiatric:        Behavior: Behavior normal.     Left subacromial injection: Consent form signed. Risk factors of bleeding and infection discussed with patient and patient is agreeable towards injection. Patient prepped with Betadine. Lateral approach towards injection used. Injected 80 mg of Depo-Medrol and 1 mL of 2% lidocaine. Patient tolerated procedure well and no side effects from noted. Minimal to no bleeding. Simple bandage applied after.   Assessment & Plan:   Problem List Items Addressed This Visit    None    Visit Diagnoses    Subacromial bursitis of left shoulder joint    -  Primary   Relevant Medications   methylPREDNISolone acetate (DEPO-MEDROL) injection 80 mg (Start on 02/10/2019  5:15 PM)       Follow up plan: Return if symptoms worsen or fail to improve.  Counseling provided for all of the vaccine components No orders of the defined types were placed in this encounter.   Caryl Pina, MD [facility] 02/10/2019, 5:08 PM

## 2019-02-10 NOTE — Patient Instructions (Signed)
Bursitis Bursitis  Se produce bursitis cuando la bolsa llena de lquido (bursa) que cubre y protege una articulacin se hincha (inflama). La bursitis es ms frecuente cerca de las articulaciones, tales como las rodillas, los codos, las caderas y los hombros. Puede causar dolor y entumecimiento. Siga estas indicaciones en su casa: Medicamentos  [employer] de venta libre y los recetados solamente como se lo haya indicado el mdico.  Si le recetaron un antibitico, tmelo como se lo haya indicado el mdico. No deje de tomarlo aunque comience a sentirse mejor. Instrucciones generales   Haga reposo a fin de descansar el rea afectada, como se lo haya indicado el mdico. ? Si puede, cuando est sentado o acostado, levante (eleve) la zona afectada por encima del nivel del corazn. ? Evite hacer cosas que Biomedical engineer.  Use una frula, dispositivo ortopdico, almohadilla o dispositivo para caminar como se lo haya indicado el mdico.  Si se lo indican, aplique hielo sobre la zona afectada: ? Si tiene una frula desmontable o dispositivo ortopdico, qutesela como se lo haya indicado el mdico. ? Ponga el hielo en una bolsa plstica. ? Coloque una [employer] piel y la [place], o entre la frula o dispositivo ortopdico y Therapist, nutritional. ? Coloque el hielo durante 40minutos, 2 a 3veces por da.  Concurra a todas las visitas de seguimiento como se lo haya indicado el mdico. Esto es importante. Prevencin de los sntomas [place] estas medidas para no volver a tener sntomas:  Use rodilleras si se arrodilla con frecuencia.  Use un calzado que le quede bien y que sea resistente para caminar o Optometrist.  Tome muchos descansos durante las actividades que implican hacer el mismo movimiento una y [place].  Antes de realizar una actividad que demande mucho esfuerzo, prepare su cuerpo haciendo estiramientos.  Mantenga un peso saludable o baje de peso si su mdico le indica que debe  [place]. Si necesita ayuda para hacer esto, consulte al mdico.  Haga ejercicio fsico con frecuencia. Si comienza una nueva actividad fsica, hgalo lentamente. Comunquese con un mdico si:  Tiene fiebre.  Tiene escalofros.  Tiene sntomas que no mejoran con el tratamiento o el cuidado Financial planner. Resumen  Se produce bursitis cuando la bolsa llena de lquido (bursa) que cubre y protege una articulacin se hincha.  Haga reposo a fin de Arts development officer rea afectada, como se lo haya indicado el mdico.  Evite hacer cosas que Biomedical engineer.  Coloque hielo en la zona afectada como se lo haya indicado el mdico. Esta informacin no tiene Marine scientist el consejo del mdico. Asegrese de hacerle al mdico cualquier pregunta que tenga. Document Released: 03/19/2011 Document Revised: 04/23/2017 Document Reviewed: 04/23/2017 Elsevier Patient Education  2020 [employer].

## 2019-03-01 ENCOUNTER — Other Ambulatory Visit: Payer: Self-pay | Admitting: Family Medicine

## 2019-03-07 ENCOUNTER — Ambulatory Visit (HOSPITAL_COMMUNITY): Payer: Medicare Other | Attending: Cardiology

## 2019-03-07 ENCOUNTER — Other Ambulatory Visit: Payer: Self-pay

## 2019-03-07 DIAGNOSIS — Z952 Presence of prosthetic heart valve: Secondary | ICD-10-CM | POA: Insufficient documentation

## 2019-03-07 DIAGNOSIS — I359 Nonrheumatic aortic valve disorder, unspecified: Secondary | ICD-10-CM | POA: Insufficient documentation

## 2019-03-08 ENCOUNTER — Telehealth: Payer: Self-pay

## 2019-03-08 DIAGNOSIS — I351 Nonrheumatic aortic (valve) insufficiency: Secondary | ICD-10-CM

## 2019-03-08 NOTE — Telephone Encounter (Signed)
-----   Message from [name], MD sent at 03/08/2019 10:31 AM EST ----- EF normal gradients across TAVR stable still with moderate AR f/u echo in a year

## 2019-03-08 NOTE — Telephone Encounter (Signed)
Patient aware of results. Per Dr. Johnsie Cancel, EF normal gradients across TAVR stable still with moderate AR f/u echo in a year. Patient verbalized understanding.

## 2019-03-10 ENCOUNTER — Other Ambulatory Visit: Payer: Self-pay | Admitting: Cardiovascular Disease

## 2019-03-15 ENCOUNTER — Telehealth: Payer: Self-pay | Admitting: Family Medicine

## 2019-03-15 NOTE — Chronic Care Management (AMB) (Signed)
Chronic Care Management   Note  03/15/2019 Name: [name] MRN: [id number] DOB: [birth year]  [name] is a 83 y.o. year old female who is a primary care patient of [name], DO. I reached out to [employer] by phone today in response to a referral sent by [name]'s health plan.     [name] was given information about Chronic Care Management services today including:  1. CCM service includes personalized support from designated clinical staff supervised by her physician, including individualized plan of care and coordination with other care providers 2. 24/7 contact phone numbers for assistance for urgent and routine care needs. 3. Service will only be billed when office clinical staff spend 20 minutes or more in a month to coordinate care. 4. Only one practitioner may furnish and bill the service in a calendar month. 5. The patient may stop CCM services at any time (effective at the end of the month) by phone call to the office staff. 6. The patient will be responsible for cost sharing (co-pay) of up to 20% of the service fee (after annual deductible is met).  Patient agreed to services and verbal consent obtained.   Follow up plan: Telephone appointment with CCM team member scheduled for:04/17/2019  [place]  [place], [place] [address] Direct Dial: [email or web address]  Website: [email or web address]

## 2019-03-21 ENCOUNTER — Ambulatory Visit: Payer: Medicare Other | Admitting: Rheumatology

## 2019-04-17 ENCOUNTER — Ambulatory Visit (INDEPENDENT_AMBULATORY_CARE_PROVIDER_SITE_OTHER): Payer: Medicare Other | Admitting: *Deleted

## 2019-04-17 DIAGNOSIS — I10 Essential (primary) hypertension: Secondary | ICD-10-CM

## 2019-04-17 DIAGNOSIS — I350 Nonrheumatic aortic (valve) stenosis: Secondary | ICD-10-CM

## 2019-04-17 DIAGNOSIS — M810 Age-related osteoporosis without current pathological fracture: Secondary | ICD-10-CM

## 2019-04-17 NOTE — Patient Instructions (Signed)
Visit Information  Goals Addressed            This Visit's Progress   . Chronic Disease Managment Needs       Current Barriers:  . Chronic Disease Management support, education, and care coordination needs related to HTN, aortic stenosis, OSA, GERD, DDD, fibromyalgia, chronic fatigue  Clinical Goal(s) related to HTN, aortic stenosis, OSA, GERD, DDD, fibromyalgia, chronic fatigue:  Over the next 90 days, patient will:  . Work with the care management team to address educational, disease management, and care coordination needs  . Call provider office for new or worsened signs and symptoms  . Call care management team with questions or concerns . Verbalize basic understanding of patient centered plan of care established today  Interventions related to HTN, aortic stenosis, OSA, GERD, DDD, fibromyalgia, chronic fatigue:  . Evaluation of current treatment plans and patient's adherence to plan as established by provider . Assessed patient understanding of disease states . Assessed patient's education and care coordination needs . Provided disease specific education to patient  . Collaborated with appropriate clinical care team members regarding patient needs  Patient Self Care Activities related to HTN, aortic stenosis, OSA, GERD, DDD, fibromyalgia, chronic fatigue:  . Patient is unable to independently self-manage chronic health conditions  Initial goal documentation        The care management team will reach out to the patient again over the next 90 days.   [name] was given information about Chronic Care Management services today including:  1. CCM service includes personalized support from designated clinical staff supervised by her physician, including individualized plan of care and coordination with other care providers 2. 24/7 contact phone numbers for assistance for urgent and routine care needs. 3. Service will only be billed when office clinical staff spend 20 minutes  or more in a month to coordinate care. 4. Only one practitioner may furnish and bill the service in a calendar month. 5. The patient may stop CCM services at any time (effective at the end of the month) by phone call to the office staff. 6. The patient will be responsible for cost sharing (co-pay) of up to 20% of the service fee (after annual deductible is met).  Patient agreed to services and verbal consent obtained.   Chong Sicilian, BSN, RN-BC Embedded Chronic Care Manager [facility] Direct Dial: [phone]   The patient verbalized understanding of instructions provided today and declined a print copy of patient instruction materials.

## 2019-04-17 NOTE — Chronic Care Management (AMB) (Signed)
Chronic Care Management   Initial Visit Note  04/17/2019 Name: [name] MRN: [id number] DOB: March 17, 1936  Referred by: [name], DO Reason for referral : Chronic Care Management (RN Initial Visit)   [name] is a 84 y.o. year old female who is a primary care patient of [name], DO. The CCM team was consulted for assistance with chronic disease management and care coordination needs related to  HTN, aortic stenosis, OSA, GERD, DDD, fibromyalgia, chronic fatigue.  Review of patient status, including review of consultants reports, relevant laboratory and other test results, and collaboration with appropriate care team members and the patient's provider was performed as part of comprehensive patient evaluation and provision of chronic care management services.    SDOH (Social Determinants of Health) screening performed today: Physical Activity. See Care Plan for related entries.   Subjective: I spoke with patient by telephone today regarding chronic care management. She doesn't feel that she has any needs at this time but is amenable to talking with the RN Care Manager again in 90 days to reassess.   Objective: Outpatient Encounter Medications as of 04/17/2019  Medication Sig  . acetaminophen (TYLENOL) 500 MG tablet Take 1,000 mg by mouth daily as needed for moderate pain or headache.  . ALOE VERA PO Take 1 capsule by mouth daily.  . Ascorbic Acid (VITAMIN C) 1000 MG tablet Take 1,000 mg by mouth daily.  Marland Kitchen aspirin EC 81 MG tablet Take 1 tablet (81 mg total) by mouth daily.  Marland Kitchen BEE POLLEN PO Take 400 mg by mouth daily.   . clobetasol cream (TEMOVATE) [id number] % APPLY 1 APPLICATION TOPICALLY 2 (TWO) TIMES DAILY AS NEEDED.  Marland Kitchen Coenzyme Q10 (COQ-10) 100 MG CAPS Take 100 mg by mouth daily.  Marland Kitchen losartan (COZAAR) 50 MG tablet Take 1 tablet (50 mg total) by mouth daily.  . metoprolol tartrate (LOPRESSOR) 50 MG tablet TAKE 1 TABLET BY MOUTH TWICE A DAY  . mupirocin ointment  (BACTROBAN) 2 % Apply to the affected area twice daily for 7-10 days  . OVER THE COUNTER MEDICATION Take 1 capsule by mouth 2 (two) times daily. Hemp Oil  . prednisoLONE acetate (PRED FORTE) 1 % ophthalmic suspension Place 1 drop into the right eye 4 (four) times daily. (Patient taking differently: Place 1 drop into the right eye 2 (two) times a day. )  . rosuvastatin (CRESTOR) 5 MG tablet Take 1 tablet (5 mg total) by mouth daily.   No facility-administered encounter medications on file as of 04/17/2019.    BP Readings from Last 3 Encounters:  02/10/19 (!) 139/49  12/27/18 (!) 137/47  11/25/18 (!) 188/60   BMI Readings from Last 3 Encounters:  02/10/19 28.22 kg/m  12/27/18 27.77 kg/m  12/08/18 27.62 kg/m     RN Assessment & Care Plan            This Visit's Progress   . Chronic Disease Managment Needs       Current Barriers:  . Chronic Disease Management support, education, and care coordination needs related to HTN, aortic stenosis, OSA, GERD, DDD, fibromyalgia, chronic fatigue  Clinical Goal(s) related to HTN, aortic stenosis, OSA, GERD, DDD, fibromyalgia, chronic fatigue:  Over the next 90 days, patient will:  . Work with the care management team to address educational, disease management, and care coordination needs  . Call provider office for new or worsened signs and symptoms  . Call care management team with questions or concerns . Verbalize basic  understanding of patient centered plan of care established today  Interventions related to HTN, aortic stenosis, OSA, GERD, DDD, fibromyalgia, chronic fatigue:  . Evaluation of current treatment plans and patient's adherence to plan as established by provider . Assessed patient understanding of disease states . Assessed patient's education and care coordination needs . Provided disease specific education to patient  . Collaborated with appropriate clinical care team members regarding patient needs  Patient Self Care  Activities related to HTN, aortic stenosis, OSA, GERD, DDD, fibromyalgia, chronic fatigue:  . Patient is unable to independently self-manage chronic health conditions  Initial goal documentation         Follow-up Plan:   The care management team will reach out to the patient again over the next 90 days.    Chong Sicilian, BSN, RN-BC Embedded Chronic Care Manager [facility] Direct Dial: [phone]

## 2019-04-24 ENCOUNTER — Ambulatory Visit: Payer: Medicare Other | Admitting: Physician Assistant

## 2019-04-24 NOTE — Progress Notes (Signed)
Office Visit Note  Patient: [name]             Date of Birth: 11/30/1935           MRN: [id number]             PCP: [name], DO Referring: [name], DO Visit Date: 04/25/2019 Occupation: @GUAROCC @  Subjective:  Other (left shoulder pain )   History of Present Illness: [name] is a 84 y.o. female with history of osteoarthritis and degenerative disc disease.  She states for the last 2 or 3 months she has been having pain and discomfort in her left shoulder.  She states she was seen by doctor few months back who gave her cortisone injection but she had no response to that.  She states she has been having discomfort in her right knee joint.  Right knee joint swells on her.  She also has discomfort in her feet and numbness in her toes with muscle cramps.  Activities of Daily Living:  Patient reports morning stiffness for 0 minutes.   Patient Reports nocturnal pain.  Difficulty dressing/grooming: Reports Difficulty climbing stairs: Reports Difficulty getting out of chair: Reports Difficulty using hands for taps, buttons, cutlery, and/or writing: Denies  Review of Systems  Constitutional: Positive for fatigue. Negative for night sweats, weight gain and weight loss.  HENT: Positive for mouth dryness. Negative for mouth sores, trouble swallowing, trouble swallowing and nose dryness.   Eyes: Positive for dryness. Negative for pain, redness, itching and visual disturbance.  Respiratory: Negative for cough, shortness of breath and difficulty breathing.   Cardiovascular: Negative for chest pain, palpitations, hypertension, irregular heartbeat and swelling in legs/feet.  Gastrointestinal: Positive for constipation. Negative for blood in stool and diarrhea.  Endocrine: Negative for increased urination.  Genitourinary: Negative for difficulty urinating, painful urination and vaginal dryness.  Musculoskeletal: Positive for arthralgias, joint pain and joint swelling.  Negative for myalgias, muscle weakness, morning stiffness, muscle tenderness and myalgias.  Skin: Negative for color change, rash, hair loss, skin tightness, ulcers and sensitivity to sunlight.  Allergic/Immunologic: Negative for susceptible to infections.  Neurological: Negative for dizziness, headaches, memory loss, night sweats and weakness.  Hematological: Positive for bruising/bleeding tendency. Negative for swollen glands.  Psychiatric/Behavioral: Negative for depressed mood, confusion and sleep disturbance. The patient is not nervous/anxious.     PMFS History:  Patient Active Problem List   Diagnosis Date Noted  . S/P TAVR (transcatheter aortic valve replacement) 10/19/2017  . Chronic fatigue 08/25/2017  . DDD (degenerative disc disease), cervical 07/08/2017  . Bursitis of left shoulder 07/08/2017  . Fibromyalgia 07/08/2017  . Atherosclerosis of abdominal aorta ([place]) 06/29/2017  . Mixed hyperlipidemia 03/19/2017  . Osteoporosis 04/21/2016  . Internal hemorrhoids with other complication 123XX123  . GI bleed 05/30/2013  . NSAID long-term use 05/30/2013  . Severe aortic stenosis 05/29/2013  . DIVERTICULOSIS-COLON 01/13/2010  . CONSTIPATION 01/13/2010  . PERSONAL HISTORY OF COLONIC POLYPS 01/13/2010  . Essential hypertension 08/28/2008  . GERD 08/28/2008  . PALPITATIONS 08/28/2008    Past Medical History:  Diagnosis Date  . Chronic headaches   . Fibromyalgia   . GERD (gastroesophageal reflux disease)   . Hemorrhoids    INTERNAL--  POST BANDING 02/ 2015  . History of kidney stones   . Hyperlipidemia   . Hypertension   . Moderate aortic stenosis   . OSA (obstructive sleep apnea) CPAP INTOLERANT   . Osteoporosis   . S/P TAVR (transcatheter aortic  valve replacement) 10/19/2017   20 mm Edwards Sapien 3 transcatheter heart valve placed via percutaneous right transfemoral approach   . Severe aortic stenosis   . Spinal stenosis, multilevel     Family History  Problem  Relation Age of Onset  . Cirrhosis Mother        drinking  . Other Brother        duodenal ulcer  . Heart disease Sister   . Cancer Son 40       colon  . Gallbladder disease Maternal Grandmother   . Stomach cancer Paternal Grandfather    Past Surgical History:  Procedure Laterality Date  . ANTERIOR CERVICAL DECOMP/DISCECTOMY FUSION  11-11-1999   C5 - C6  . APPENDECTOMY  AGE 17  . CARDIAC CATHETERIZATION  2008  DR [facility]   ESSENTIALLY NORMAL  . CATARACT EXTRACTION Bilateral   . CATARACT EXTRACTION W/ INTRAOCULAR LENS  IMPLANT, BILATERAL  2012  . EYE SURGERY    . FLEXIBLE SIGMOIDOSCOPY N/A 06/01/2013   Procedure: FLEXIBLE SIGMOIDOSCOPY;  Surgeon: Inda Castle, MD;  Location: [facility];  Service: Endoscopy;  Laterality: N/A;  may need hemorrhoidal banding  . HEMORRHOIDECTOMY WITH HEMORRHOID BANDING  08-07-2010  . LAPAROSCOPIC CHOLECYSTECTOMY  1990  . RIGHT/LEFT HEART CATH AND CORONARY ANGIOGRAPHY N/A 09/29/2017   Procedure: RIGHT/LEFT HEART CATH AND CORONARY ANGIOGRAPHY;  Surgeon: Sherren Mocha, MD;  Location: [place] [facility];  Service: Cardiovascular;  Laterality: N/A;  . SHOULDER ARTHROSCOPY WITH OPEN ROTATOR CUFF REPAIR AND DISTAL CLAVICLE ACROMINECTOMY Right 05/25/2012   Procedure: SHOULDER ARTHROSCOPY WITH OPEN ROTATOR CUFF REPAIR AND DISTAL CLAVICLE ACROMINECTOMY;  Surgeon: Magnus Sinning, MD;  Location: [place];  Service: Orthopedics;  Laterality: Right;  RIGHT SHOULDER ARTHROSCOPY WITH DERBRIDEMENTOF LABRAL/BICEP TENDON, OPEN DISTAL CLAVICLE RESECTION, ANTERIOR ACROMINECTOMY ROTATOR CUFF REPAIR ANESTHESIA: GENERAL/SCALENE NERVE BLOCK  . TEE WITHOUT CARDIOVERSION Bilateral 10/19/2017   Procedure: TRANSESOPHAGEAL ECHOCARDIOGRAM (TEE);  Surgeon: Sherren Mocha, MD;  Location: [place];  Service: Open Heart Surgery;  Laterality: Bilateral;  . TENOSYNOVECTOMY Left 01/04/2014   Procedure: LEFT WRIST EXTENSOR TENOSYNOVECTOMY;  Surgeon: Linna Hoff, MD;   Location: [facility];  Service: Orthopedics;  Laterality: Left;  . THYROIDECTOMY  AGE 52   GOITER  . TONSILLECTOMY AND ADENOIDECTOMY  AGE 66  . TRANSCATHETER AORTIC VALVE REPLACEMENT, TRANSFEMORAL  10/19/2017  . TRANSCATHETER AORTIC VALVE REPLACEMENT, TRANSFEMORAL Bilateral 10/19/2017   Procedure: TRANSCATHETER AORTIC VALVE REPLACEMENT, TRANSFEMORAL;  Surgeon: Sherren Mocha, MD;  Location: [place];  Service: Open Heart Surgery;  Laterality: Bilateral;  . TRANSTHORACIC ECHOCARDIOGRAM  last one 04-20-2013  DR [facility]    NORMAL LVSF/ EF 60-65%/ MODERATE  AV  STENOSIS WITH NO AR /  MILD LAE  . UMBILICAL HERNIA REPAIR  JUNE 2006  . VAGINAL HYSTERECTOMY  01-31-2001   ANTERIOR & POSTERIOR REPAIR/ TRANSVAGINAL BLADDER SLING   Social History   Social History Narrative  . Not on file   Immunization History  Administered Date(s) Administered  . Influenza,inj,Quad PF,6+ Mos 02/01/2013     Objective: Vital Signs: BP (!) 146/52 (BP Location: Left Arm, Patient Position: Sitting, Cuff Size: Normal)   Pulse 64   Resp 14   Ht 5\' 2"  (1.575 m)   Wt 164 lb (74.4 kg)   BMI 30.00 kg/m    Physical Exam Vitals and nursing note reviewed.  Constitutional:      Appearance: She is well-developed.  HENT:     Head: Normocephalic and atraumatic.  Eyes:  Conjunctiva/sclera: Conjunctivae normal.  Cardiovascular:     Rate and Rhythm: Normal rate and regular rhythm.     Heart sounds: Normal heart sounds.  Pulmonary:     Effort: Pulmonary effort is normal.     Breath sounds: Normal breath sounds.  Abdominal:     General: Bowel sounds are normal.     Palpations: Abdomen is soft.  Musculoskeletal:     Cervical back: Normal range of motion.  Lymphadenopathy:     Cervical: No cervical adenopathy.  Skin:    General: Skin is warm and dry.     Capillary Refill: Capillary refill takes less than 2 seconds.  Neurological:     Mental Status: She is alert and oriented to person, place, and  time.  Psychiatric:        Behavior: Behavior normal.      Musculoskeletal Exam: Patient had limited range of motion of her cervical spine without any discomfort.  She has some thoracic kyphosis.  She had painful range of motion of her left shoulder joint with abduction and internal rotation.  No point tenderness was noted.  Elbow joints and wrist joints were in good range of motion.  She has bilateral PIP and DIP thickening in her hands consistent with osteoarthritis.  She has severe osteoarthritis in her right knee joint with limited range of motion and discomfort.  No effusion was noted.  CDAI Exam: CDAI Score: -- Patient Global: --; Provider Global: -- Swollen: --; Tender: -- Joint Exam 04/25/2019   No joint exam has been documented for this visit   There is currently no information documented on the homunculus. Go to the Rheumatology activity and complete the homunculus joint exam.  Investigation: No additional findings.  Imaging: XR Shoulder Left  Result Date: 04/25/2019 Spurring of the glenoid was noted.  No chondrocalcinosis was noted.  Calcific tendinosis was noted.  No acromioclavicular joint space narrowing was noted. Impression: These findings are consistent with osteoarthritis and calcific tendinosis of the shoulder.   Recent Labs: Lab Results  Component Value Date   WBC 7.1 10/21/2017   HGB 11.2 (L) 10/21/2017   PLT 121 (L) 10/21/2017   NA 143 12/27/2018   K 4.7 12/27/2018   CL 104 12/27/2018   CO2 27 12/27/2018   GLUCOSE 92 12/27/2018   BUN 18 12/27/2018   CREATININE 0.87 12/27/2018   BILITOT 0.7 10/15/2017   ALKPHOS 56 10/15/2017   AST 20 10/15/2017   ALT 18 10/15/2017   PROT 6.7 10/15/2017   ALBUMIN 3.7 10/15/2017   CALCIUM 9.4 12/27/2018   GFRAA 72 12/27/2018    Speciality Comments: No specialty comments available.  Procedures:  Large Joint Inj: R knee on 04/25/2019 3:07 PM Indications: pain Details: 27 G 1.5 in needle, medial  approach  Arthrogram: No  Medications: 40 mg triamcinolone acetonide 40 MG/ML; 1.5 mL lidocaine 1 % Aspirate: 0 mL Outcome: tolerated well, no immediate complications Procedure, treatment alternatives, risks and benefits explained, specific risks discussed. Consent was given by the patient. Immediately prior to procedure a time out was called to verify the correct patient, procedure, equipment, support staff and site/side marked as required. Patient was prepped and draped in the usual sterile fashion.     Allergies: Tape and Prolia [denosumab]   Assessment / Plan:     Visit Diagnoses: Chronic left shoulder pain -patient has been experiencing pain in her left shoulder for the last 3 to 4 months.  She states she had injection in the past which  did not help her.  She continues to have discomfort getting dressed and lifting her arm.  Plan: XR Shoulder Left.  The x-ray showed calcific tendinosis and inferior spurring of the glenoid.  Patient states that she had a cortisone injection about 2 months ago and had no response to that.  I will refer her to orthopedics.  Primary osteoarthritis of both hands-she has severe osteoarthritis in her hands but she is not having much discomfort currently.  Primary osteoarthritis of both knees - right knee injected on 09/19/2018.  She has severe end-stage osteoarthritis in her knee joints.  She states the discomfort comes and goes although she lives in chronic discomfort.  She requested a cortisone injection to her right knee joint today.  She is not interested in total knee replacement.  After informed consent was obtained right knee joint was injected with cortisone as described above which she tolerated well.  Fibromyalgia-she continues to have some generalized pain and discomfort.  DDD (degenerative disc disease), cervical-patient states she had fusion in the past and has some chronic discomfort but denies any radiculopathy.  Age-related osteoporosis without  current pathological fracture - DEXA on 12/10/16: The BMD measured at Forearm Radius 33% is 0.567 g/cm2 with a T-score of -3.6.  I do not see the patient is on any medications.  She states her bone density is managed by her PCP.  Family history of lupus erythematosus - Daughter  Positive Myrella (antinuclear antibody) - Repeat Chasitee negative, RNP positive  Essential hypertension  History of anemia  History of diverticulosis  History of kidney stones  Aortic stenosis, moderate - She has valve replacement on 10/19/17.  History of coronary artery disease  Gastroesophageal reflux disease without esophagitis  Orders: Orders Placed This Encounter  Procedures  . Large Joint Inj  . XR Shoulder Left   No orders of the defined types were placed in this encounter.   Face-to-face time spent with patient was 30 minutes. Greater than 50% of time was spent in counseling and coordination of care.  Follow-Up Instructions: Return in 6 months (on 10/23/2019) for Osteoarthritis.   Bo Merino, MD  Note - This record has been created using Editor, commissioning.  Chart creation errors have been sought, but may not always  have been located. Such creation errors do not reflect on  the standard of medical care.

## 2019-04-25 ENCOUNTER — Ambulatory Visit: Payer: Self-pay

## 2019-04-25 ENCOUNTER — Other Ambulatory Visit: Payer: Self-pay

## 2019-04-25 ENCOUNTER — Encounter: Payer: Self-pay | Admitting: Rheumatology

## 2019-04-25 ENCOUNTER — Other Ambulatory Visit: Payer: Self-pay | Admitting: Cardiovascular Disease

## 2019-04-25 ENCOUNTER — Other Ambulatory Visit: Payer: Self-pay | Admitting: Family Medicine

## 2019-04-25 ENCOUNTER — Ambulatory Visit (INDEPENDENT_AMBULATORY_CARE_PROVIDER_SITE_OTHER): Payer: Medicare Other | Admitting: Rheumatology

## 2019-04-25 VITALS — BP 146/52 | HR 64 | Resp 14 | Ht 62.0 in | Wt 164.0 lb

## 2019-04-25 DIAGNOSIS — Z87442 Personal history of urinary calculi: Secondary | ICD-10-CM

## 2019-04-25 DIAGNOSIS — M25512 Pain in left shoulder: Secondary | ICD-10-CM

## 2019-04-25 DIAGNOSIS — Z8679 Personal history of other diseases of the circulatory system: Secondary | ICD-10-CM

## 2019-04-25 DIAGNOSIS — M5030 Other cervical disc degeneration, unspecified cervical region: Secondary | ICD-10-CM

## 2019-04-25 DIAGNOSIS — Z8719 Personal history of other diseases of the digestive system: Secondary | ICD-10-CM

## 2019-04-25 DIAGNOSIS — I10 Essential (primary) hypertension: Secondary | ICD-10-CM

## 2019-04-25 DIAGNOSIS — M19041 Primary osteoarthritis, right hand: Secondary | ICD-10-CM

## 2019-04-25 DIAGNOSIS — K219 Gastro-esophageal reflux disease without esophagitis: Secondary | ICD-10-CM

## 2019-04-25 DIAGNOSIS — Z840 Family history of diseases of the skin and subcutaneous tissue: Secondary | ICD-10-CM

## 2019-04-25 DIAGNOSIS — M797 Fibromyalgia: Secondary | ICD-10-CM | POA: Diagnosis not present

## 2019-04-25 DIAGNOSIS — Z862 Personal history of diseases of the blood and blood-forming organs and certain disorders involving the immune mechanism: Secondary | ICD-10-CM

## 2019-04-25 DIAGNOSIS — R768 Other specified abnormal immunological findings in serum: Secondary | ICD-10-CM

## 2019-04-25 DIAGNOSIS — G8929 Other chronic pain: Secondary | ICD-10-CM

## 2019-04-25 DIAGNOSIS — M170 Bilateral primary osteoarthritis of knee: Secondary | ICD-10-CM | POA: Diagnosis not present

## 2019-04-25 DIAGNOSIS — I350 Nonrheumatic aortic (valve) stenosis: Secondary | ICD-10-CM

## 2019-04-25 DIAGNOSIS — M19042 Primary osteoarthritis, left hand: Secondary | ICD-10-CM

## 2019-04-25 DIAGNOSIS — M810 Age-related osteoporosis without current pathological fracture: Secondary | ICD-10-CM

## 2019-04-25 MED ORDER — LIDOCAINE HCL 1 % IJ SOLN
1.5000 mL | INTRAMUSCULAR | Status: AC | PRN
  Administered 2019-04-25: 1.5 mL

## 2019-04-25 MED ORDER — TRIAMCINOLONE ACETONIDE 40 MG/ML IJ SUSP
40.0000 mg | INTRAMUSCULAR | Status: AC | PRN
  Administered 2019-04-25: 40 mg via INTRA_ARTICULAR

## 2019-04-26 MED ORDER — LOSARTAN POTASSIUM 50 MG PO TABS: 50.0000 mg | ORAL_TABLET | Freq: Every day | ORAL | 1 refills | Status: DC

## 2019-05-04 ENCOUNTER — Telehealth: Payer: Self-pay | Admitting: Cardiovascular Disease

## 2019-05-04 NOTE — Telephone Encounter (Signed)
New Message  Patient c/o Palpitations:  High priority if patient c/o lightheadedness, shortness of breath, or chest pain  1) How long have you had palpitations/irregular HR/ Afib? Are you having the symptoms now? For 2 weeks  2) Are you currently experiencing lightheadedness, SOB or CP? No  3) Do you have a history of afib (atrial fibrillation) or irregular heart rhythm? No  4) Have you checked your BP or HR? (document readings if available): 163/55; 73;   5) Are you experiencing any other symptoms? Very tired and feel like things are moving around

## 2019-05-04 NOTE — Telephone Encounter (Signed)
Called patient back about her symptoms. Patient complaining of being dizzy at times, feeling tired and weak for the past 2 weeks. Patient stated she feels fine right now. Patient denies SOB, CP or any new swelling (patient stated one of her ankles swells all the time). Patient complaining of elevated BP, SBP 150's to 170's, HR 70's. Patient is thinking it's related to her aortic valve and was concerned about her last echo showing AR. Asked patient if there has been any changes that started 2 weeks ago. Patient stated that she has not been eating well, and has lost her appetite, except for in the middle of the night she will wake up feeling very hungry. Asked patient if she has had any issues with blood sugar, patient stated she had some issues a long time ago, not now. Encouraged patient to call her PCP to  discuss her symptoms as well. Informed patient a message would be sent to Dr. Johnsie Cancel for advisement.

## 2019-05-05 ENCOUNTER — Other Ambulatory Visit: Payer: Self-pay

## 2019-05-05 ENCOUNTER — Encounter: Payer: Self-pay | Admitting: Orthopedic Surgery

## 2019-05-05 ENCOUNTER — Ambulatory Visit (INDEPENDENT_AMBULATORY_CARE_PROVIDER_SITE_OTHER): Payer: Medicare Other | Admitting: Orthopedic Surgery

## 2019-05-05 DIAGNOSIS — M75122 Complete rotator cuff tear or rupture of left shoulder, not specified as traumatic: Secondary | ICD-10-CM

## 2019-05-05 DIAGNOSIS — M5412 Radiculopathy, cervical region: Secondary | ICD-10-CM

## 2019-05-05 NOTE — Telephone Encounter (Signed)
PCP?

## 2019-05-05 NOTE — Progress Notes (Signed)
Office Visit Note   Patient: [name]           Date of Birth: 02/07/1936           MRN: [id number] Visit Date: 05/05/2019 Requested by: Bo Merino, MD [address] [place],  [place] [address] PCP: [name], DO  Subjective: Chief Complaint  Patient presents with  . Left Shoulder - Pain    HPI: Patient presents with left shoulder pain of 2 months duration.  Denies any history of injury.  Describes painful range of motion.  She had an injection which gave her some relief.  Currently the pain has improved and it is livable.  When she puts her arm overhead her symptoms are better.  She does describe a burning crushing type pain in the mid humeral shaft region as well as some deltoid pain.  Does have a history of neck surgery 10 years ago which was cervical fusion.  Also has a history of right shoulder rotator cuff repair done years ago.  Takes Tylenol for osteoarthritis.  Left shoulder radiographs from January 2021 show some mild narrowing of the acromiohumeral distance but no acute fracture and fairly minimal arthritis in the glenohumeral joint.              ROS: All systems reviewed are negative as they relate to the chief complaint within the history of present illness.  Patient denies  fevers or chills.   Assessment & Plan: Visit Diagnoses:  1. Nontraumatic complete tear of left rotator cuff   2. Radiculopathy, cervical region     Plan: Impression is left shoulder pain which could be early symptomatic rotator cuff arthropathy as well as early cervical radiculopathy.  Currently her symptoms are improving on their own.  This is something she can live with for now.  Plan to check her back for clinical recheck in 8 weeks for decision for or against repeat intra-articular injection versus plain radiographs of the neck and possible MRI scan with cervical ESI to follow.  Follow-Up Instructions: Return in about 8 weeks (around 06/30/2019).   Orders:  No  orders of the defined types were placed in this encounter.  No orders of the defined types were placed in this encounter.     Procedures: No procedures performed   Clinical Data: No additional findings.  Objective: Vital Signs: There were no vitals taken for this visit.  Physical Exam:   Constitutional: Patient appears well-developed HEENT:  Head: Normocephalic Eyes:EOM are normal Neck: Normal range of motion Cardiovascular: Normal rate Pulmonary/chest: Effort normal Neurologic: Patient is alert Skin: Skin is warm Psychiatric: Patient has normal mood and affect    Ortho Exam: Ortho exam demonstrates intact motor or sensory function in both hands.  Radial pulse intact bilaterally.  She has pretty reasonable cervical spine range of motion with mild pain with rotation to the left.  She does have some coarse grinding and crepitus with internal and external rotation on the left-hand side at 90 degrees of abduction.  Slight weakness to infraspinatus testing on the left compared to the right.  Subscap testing strength is 5+ out of 5 bilaterally.  Impingement signs equivocal.  No masses lymphadenopathy or skin changes noted on that left-hand side no definite paresthesias C5-T1.  Specialty Comments:  No specialty comments available.  Imaging: No results found.   PMFS History: Patient Active Problem List   Diagnosis Date Noted  . S/P TAVR (transcatheter aortic valve replacement) 10/19/2017  . Chronic  fatigue 08/25/2017  . DDD (degenerative disc disease), cervical 07/08/2017  . Bursitis of left shoulder 07/08/2017  . Fibromyalgia 07/08/2017  . Atherosclerosis of abdominal aorta ([place]) 06/29/2017  . Mixed hyperlipidemia 03/19/2017  . Osteoporosis 04/21/2016  . Internal hemorrhoids with other complication 123XX123  . GI bleed 05/30/2013  . NSAID long-term use 05/30/2013  . Severe aortic stenosis 05/29/2013  . DIVERTICULOSIS-COLON 01/13/2010  . CONSTIPATION 01/13/2010  .  PERSONAL HISTORY OF COLONIC POLYPS 01/13/2010  . Essential hypertension 08/28/2008  . GERD 08/28/2008  . PALPITATIONS 08/28/2008   Past Medical History:  Diagnosis Date  . Chronic headaches   . Fibromyalgia   . GERD (gastroesophageal reflux disease)   . Hemorrhoids    INTERNAL--  POST BANDING 02/ 2015  . History of kidney stones   . Hyperlipidemia   . Hypertension   . Moderate aortic stenosis   . OSA (obstructive sleep apnea) CPAP INTOLERANT   . Osteoporosis   . S/P TAVR (transcatheter aortic valve replacement) 10/19/2017   20 mm Edwards Sapien 3 transcatheter heart valve placed via percutaneous right transfemoral approach   . Severe aortic stenosis   . Spinal stenosis, multilevel     Family History  Problem Relation Age of Onset  . Cirrhosis Mother        drinking  . Other Brother        duodenal ulcer  . Heart disease Sister   . Cancer Son 25       colon  . Gallbladder disease Maternal Grandmother   . Stomach cancer Paternal Grandfather     Past Surgical History:  Procedure Laterality Date  . ANTERIOR CERVICAL DECOMP/DISCECTOMY FUSION  11-11-1999   C5 - C6  . APPENDECTOMY  AGE 35  . CARDIAC CATHETERIZATION  2008  DR [facility]   ESSENTIALLY NORMAL  . CATARACT EXTRACTION Bilateral   . CATARACT EXTRACTION W/ INTRAOCULAR LENS  IMPLANT, BILATERAL  2012  . EYE SURGERY    . FLEXIBLE SIGMOIDOSCOPY N/A 06/01/2013   Procedure: FLEXIBLE SIGMOIDOSCOPY;  Surgeon: Inda Castle, MD;  [facility];  Service: Endoscopy;  Laterality: N/A;  may need hemorrhoidal banding  . HEMORRHOIDECTOMY WITH HEMORRHOID BANDING  08-07-2010  . LAPAROSCOPIC CHOLECYSTECTOMY  1990  . RIGHT/LEFT HEART CATH AND CORONARY ANGIOGRAPHY N/A 09/29/2017   Procedure: RIGHT/LEFT HEART CATH AND CORONARY ANGIOGRAPHY;  Surgeon: Sherren Mocha, MD;  Location: [place] [facility];  Service: Cardiovascular;  Laterality: N/A;  . SHOULDER ARTHROSCOPY WITH OPEN ROTATOR CUFF REPAIR AND DISTAL CLAVICLE  ACROMINECTOMY Right 05/25/2012   Procedure: SHOULDER ARTHROSCOPY WITH OPEN ROTATOR CUFF REPAIR AND DISTAL CLAVICLE ACROMINECTOMY;  Surgeon: Magnus Sinning, MD;  Location: [place];  Service: Orthopedics;  Laterality: Right;  RIGHT SHOULDER ARTHROSCOPY WITH DERBRIDEMENTOF LABRAL/BICEP TENDON, OPEN DISTAL CLAVICLE RESECTION, ANTERIOR ACROMINECTOMY ROTATOR CUFF REPAIR ANESTHESIA: GENERAL/SCALENE NERVE BLOCK  . TEE WITHOUT CARDIOVERSION Bilateral 10/19/2017   Procedure: TRANSESOPHAGEAL ECHOCARDIOGRAM (TEE);  Surgeon: Sherren Mocha, MD;  Location: [place];  Service: Open Heart Surgery;  Laterality: Bilateral;  . TENOSYNOVECTOMY Left 01/04/2014   Procedure: LEFT WRIST EXTENSOR TENOSYNOVECTOMY;  Surgeon: Linna Hoff, MD;  Location: [facility];  Service: Orthopedics;  Laterality: Left;  . THYROIDECTOMY  AGE 70   GOITER  . TONSILLECTOMY AND ADENOIDECTOMY  AGE 49  . TRANSCATHETER AORTIC VALVE REPLACEMENT, TRANSFEMORAL  10/19/2017  . TRANSCATHETER AORTIC VALVE REPLACEMENT, TRANSFEMORAL Bilateral 10/19/2017   Procedure: TRANSCATHETER AORTIC VALVE REPLACEMENT, TRANSFEMORAL;  Surgeon: Sherren Mocha, MD;  Location: [place];  Service:  Open Heart Surgery;  Laterality: Bilateral;  . TRANSTHORACIC ECHOCARDIOGRAM  last one 04-20-2013  DR [facility]    NORMAL LVSF/ EF 60-65%/ MODERATE  AV  STENOSIS WITH NO AR /  MILD LAE  . UMBILICAL HERNIA REPAIR  JUNE 2006  . VAGINAL HYSTERECTOMY  01-31-2001   ANTERIOR & POSTERIOR REPAIR/ TRANSVAGINAL BLADDER SLING   Social History   Occupational History  . Occupation: Retired  Tobacco Use  . Smoking status: Former Smoker    Packs/day: 0.25    Years: 5.00    Pack years: 1.25    Types: Cigarettes    Quit date: 04/14/2003    Years since quitting: 16.0  . Smokeless tobacco: Never Used  Substance and Sexual Activity  . Alcohol use: Yes    Comment: once in a while-social  . Drug use: Never    Comment: hemp oil   . Sexual activity: Not  Currently

## 2019-05-08 ENCOUNTER — Encounter: Payer: Self-pay | Admitting: Family Medicine

## 2019-05-08 ENCOUNTER — Other Ambulatory Visit: Payer: Self-pay

## 2019-05-08 ENCOUNTER — Ambulatory Visit (INDEPENDENT_AMBULATORY_CARE_PROVIDER_SITE_OTHER): Payer: Medicare Other | Admitting: Family Medicine

## 2019-05-08 VITALS — BP 156/45 | HR 69 | Temp 99.3°F | Ht 62.0 in | Wt 160.0 lb

## 2019-05-08 DIAGNOSIS — R5383 Other fatigue: Secondary | ICD-10-CM | POA: Diagnosis not present

## 2019-05-08 DIAGNOSIS — R42 Dizziness and giddiness: Secondary | ICD-10-CM

## 2019-05-08 LAB — GLUCOSE HEMOCUE WAIVED: Glu Hemocue Waived: 95 mg/dL (ref 65–99)

## 2019-05-08 LAB — HEMOGLOBIN, FINGERSTICK: Hemoglobin: 13.4 g/dL (ref 11.1–15.9)

## 2019-05-08 MED ORDER — MECLIZINE HCL 25 MG PO TABS: 12.5000 mg | ORAL_TABLET | Freq: Three times a day (TID) | ORAL | 0 refills | Status: DC | PRN

## 2019-05-08 NOTE — Progress Notes (Signed)
Subjective: CC: Dizziness PCP: [name], DO HPI:[name] is a 84 y.o. female presenting to clinic today for:  1. Dizziness Patient reports couple month history of intermittent dizziness.  She describes this as room spinning.  She notes that she had a bad episode over the weekend and still feels somewhat abnormal like she could be dizzy but is not overtly dizzy.  Denies any blood loss.  She is to continue to do only light things around the house given her heart condition.  She has an upcoming appointment with her cardiologist in about 2 weeks.  She is compliant with her blood pressure medications.  She denies any overt chest pain or shortness of breath outside of her baseline.  She has had some intermittent cramping in her calves and wonders if she has an electrolyte deficiency.  She also has considered that perhaps her blood sugars getting too low and that is why she becomes fatigued easily.  Sometimes she does not always get hungry for meals and forgets to eat.   ROS: Per HPI  Allergies  Allergen Reactions  . Tape Other (See Comments)    Dermatitis rash "with extended exposure"  . Prolia [Denosumab] Other (See Comments)    Arthralgia/ myalgia/ jaw pain/ headache   Past Medical History:  Diagnosis Date  . Chronic headaches   . Fibromyalgia   . GERD (gastroesophageal reflux disease)   . Hemorrhoids    INTERNAL--  POST BANDING 02/ 2015  . History of kidney stones   . Hyperlipidemia   . Hypertension   . Moderate aortic stenosis   . OSA (obstructive sleep apnea) CPAP INTOLERANT   . Osteoporosis   . S/P TAVR (transcatheter aortic valve replacement) 10/19/2017   20 mm Edwards Sapien 3 transcatheter heart valve placed via percutaneous right transfemoral approach   . Severe aortic stenosis   . Spinal stenosis, multilevel     Current Outpatient Medications:  .  acetaminophen (TYLENOL) 500 MG tablet, Take 1,000 mg by mouth daily as needed for moderate pain or headache.,  Disp: , Rfl:  .  alendronate (FOSAMAX) 70 MG tablet, TAKE 1 TABLET (70 MG TOTAL) BY MOUTH EVERY 7 (SEVEN) DAYS. TAKE WITH A FULL GLASS OF WATER ON AN EMPTY STOMACH., Disp: 12 tablet, Rfl: 3 .  ALOE VERA PO, Take 1 capsule by mouth daily., Disp: , Rfl:  .  Ascorbic Acid (VITAMIN C) 1000 MG tablet, Take 1,000 mg by mouth daily., Disp: , Rfl:  .  aspirin EC 81 MG tablet, Take 1 tablet (81 mg total) by mouth daily., Disp: 90 tablet, Rfl: 3 .  BEE POLLEN PO, Take 400 mg by mouth daily. , Disp: , Rfl:  .  clobetasol cream (TEMOVATE) [id number] %, APPLY 1 APPLICATION TOPICALLY 2 (TWO) TIMES DAILY AS NEEDED., Disp: 30 g, Rfl: 3 .  Coenzyme Q10 (COQ-10) 100 MG CAPS, Take 100 mg by mouth daily., Disp: , Rfl:  .  losartan (COZAAR) 50 MG tablet, Take 1 tablet (50 mg total) by mouth daily., Disp: 90 tablet, Rfl: 1 .  metoprolol tartrate (LOPRESSOR) 50 MG tablet, TAKE 1 TABLET BY MOUTH TWICE A DAY, Disp: 180 tablet, Rfl: 1 .  mupirocin ointment (BACTROBAN) 2 %, Apply to the affected area twice daily for 7-10 days, Disp: 22 g, Rfl: 0 .  OVER THE COUNTER MEDICATION, Take 1 capsule by mouth 2 (two) times daily. Hemp Oil, Disp: , Rfl:  .  prednisoLONE acetate (PRED FORTE) 1 % ophthalmic suspension, Place  1 drop into the right eye 4 (four) times daily. (Patient taking differently: Place 1 drop into the right eye 2 (two) times a day. ), Disp: 10 mL, Rfl: 0 .  rosuvastatin (CRESTOR) 5 MG tablet, TAKE 1 TABLET BY MOUTH EVERY DAY, Disp: 90 tablet, Rfl: 0 Social History   Socioeconomic History  . Marital status: Married    Spouse name: Shanon Brow  . Number of children: 6  . Years of education: 6  . Highest education level: 6th grade  Occupational History  . Occupation: Retired  Tobacco Use  . Smoking status: Former Smoker    Packs/day: 0.25    Years: 5.00    Pack years: 1.25    Types: Cigarettes    Quit date: 04/14/2003    Years since quitting: 16.0  . Smokeless tobacco: Never Used  Substance and Sexual Activity  .  Alcohol use: Yes    Comment: once in a while-social  . Drug use: Never    Comment: hemp oil   . Sexual activity: Not Currently  Other Topics Concern  . Not on file  Social History Narrative  . Not on file   Social Determinants of Health   Financial Resource Strain: Low Risk   . Difficulty of Paying Living Expenses: Not hard at all  Food Insecurity: No Food Insecurity  . Worried About Charity fundraiser in the Last Year: Never true  . Ran Out of Food in the Last Year: Never true  Transportation Needs: No Transportation Needs  . Lack of Transportation (Medical): No  . Lack of Transportation (Non-Medical): No  Physical Activity: Inactive  . Days of Exercise per Week: 0 days  . Minutes of Exercise per Session: 0 min  Stress: No Stress Concern Present  . Feeling of Stress : Not at all  Social Connections: Not Isolated  . Frequency of Communication with Friends and Family: More than three times a week  . Frequency of Social Gatherings with Friends and Family: Once a week  . Attends Religious Services: More than 4 times per year  . Active Member of Clubs or Organizations: Yes  . Attends Archivist Meetings: More than 4 times per year  . Marital Status: Married  Human resources officer Violence: Not At Risk  . Fear of Current or Ex-Partner: No  . Emotionally Abused: No  . Physically Abused: No  . Sexually Abused: No   Family History  Problem Relation Age of Onset  . Cirrhosis Mother        drinking  . Other Brother        duodenal ulcer  . Heart disease Sister   . Cancer Son 81       colon  . Gallbladder disease Maternal Grandmother   . Stomach cancer Paternal Grandfather     Objective: Office vital signs reviewed. BP (!) 156/45   Pulse 69   Temp 99.3 F (37.4 C) (Temporal)   Ht 5\' 2"  (1.575 m)   Wt 160 lb (72.6 kg)   SpO2 98%   BMI 29.26 kg/m   Physical Examination:  General: Awake, alert, has a bluish hue to skin (chronic), No acute distress HEENT:  Normal Cardio: regular rate and rhythm, [id number] heard, systolic murmur present Pulm: clear to auscultation bilaterally, no wheezes, rhonchi or rales; normal work of breathing on room air MSK: ambulates independently very slowly Skin: blue hue to nails Neuro: AOx3, no focal neurologic deficits  Orthostatic VS for the past 24 hrs:  BP- Sitting  Pulse- Sitting BP- Standing at 0 minutes Pulse- Standing at 0 minutes  05/08/19 1346 156/45 62 163/58 69      Assessment/ Plan: 84 y.o. female   1. Vertigo Her symptoms sound vertiginous.  She is not currently having a spell but I have given her Antivert to have on hand should this occur.  I will check her for electrolyte deficiency.  EKG was obtained and did not demonstrate any arrhythmias. - meclizine (ANTIVERT) 25 MG tablet; Take 0.5-1 tablets (12.5-25 mg total) by mouth 3 (three) times daily as needed for dizziness.  Dispense: 30 tablet; Refill: 0 - Basic Metabolic Panel  2. Dizziness - EKG 12-Lead - Hemoglobin, fingerstick - Glucose Hemocue Waived - Basic Metabolic Panel  3. Easy fatigability I will reach out to her cardiologist to see if perhaps she can be seen sooner given easy fatigability.  Check for metabolic etiology - EKG [id number] - Hemoglobin, fingerstick - Glucose Hemocue Waived - Basic Metabolic Panel - TSH   Orders Placed This Encounter  Procedures  . EKG 12-Lead   No orders of the defined types were placed in this encounter.    [name], DO [place] [phone]

## 2019-05-08 NOTE — Patient Instructions (Signed)
Your blood pressures are stable.  Your EKG did not show any new changes or abnormal heart rhythms.  Because you are feeling more fatigued and worn out, I am going to check your blood for anemia, problems with sugar, thyroid problems.  I am also going to see if Dr. Johnsie Cancel can see you sooner than 2 weeks.  You will get a call tomorrow.

## 2019-05-08 NOTE — Telephone Encounter (Signed)
Called patient to make her aware of Dr. Kyla Balzarine recommendations. Patient verbalized understanding and will call her PCP today.

## 2019-05-09 LAB — TSH: TSH: 2.57 u[IU]/mL (ref 0.450–4.500)

## 2019-05-09 LAB — BASIC METABOLIC PANEL
BUN/Creatinine Ratio: 23 (ref 12–28)
BUN: 22 mg/dL (ref 8–27)
CO2: 27 mmol/L (ref 20–29)
Calcium: 9.5 mg/dL (ref 8.7–10.3)
Chloride: 105 mmol/L (ref 96–106)
Creatinine, Ser: 0.94 mg/dL (ref 0.57–1.00)
GFR calc Af Amer: 66 mL/min/{1.73_m2} (ref >59)
GFR calc non Af Amer: 57 mL/min/{1.73_m2} — ABNORMAL LOW (ref >59)
Glucose: 90 mg/dL (ref 65–99)
Potassium: 5.3 mmol/L — ABNORMAL HIGH (ref 3.5–5.2)
Sodium: 143 mmol/L (ref 134–144)

## 2019-05-09 NOTE — Progress Notes (Signed)
Cardiology Office Note    Date:  05/09/2019   ID:  Megan, [name] 1935/10/18, MRN [id number]  PCP:  [name], DO  Cardiologist:  Dr. Ala Bent: Dr. Jeronimo Greaves. Roxy Manns   Chief Complaint: BP follow up  History of Present Illness:   [name] is a 84 y.o. female with a history of HTN, HLD, fibromyalgia, GERD and severe ASs/p TAVR (10/19/17) who presents to clinic for  follow up.TAVR 10/19/17 with 20 mm Sapien no CAD at cath post implant mean gradient 12 mmHg despite Small size of valve. Moderate PVL BP improved with cozaar She called office 05/04/19 with mailaise, fatigue and poor appetite Not thought to be related to heart.   TTE 03/07/19 EF 60-65%  Trace MR TAVR 20 mm Sapien mean gradient 19 mmhg  Peak 36 mmHg moderate PVL  Labs 05/08/19 TSH normal  K 5.3 Cr 0.94 Hb 13.4   Seen by primary 05/08/19 for dizziness going on for couple of months Room spinning Not postural no frank syncope  She was given antivert ECG in office was normal with no arrhythmia reviewed   Discussed issues with small valve and PVL Feel she should have cardiac CTA to make sure The valve is fully expanded and has no micro thrombi. If not fully expanded may benefit from  Balloon Rx to minimize PVL   No dyspnea palpitations or chest pain Told her she could stop her Plavix No CAD at cath  Needs f/u TTE in July   Past Medical History:  Diagnosis Date  . Chronic headaches   . Fibromyalgia   . GERD (gastroesophageal reflux disease)   . Hemorrhoids    INTERNAL--  POST BANDING 02/ 2015  . History of kidney stones   . Hyperlipidemia   . Hypertension   . Moderate aortic stenosis   . OSA (obstructive sleep apnea) CPAP INTOLERANT   . Osteoporosis   . S/P TAVR (transcatheter aortic valve replacement) 10/19/2017   20 mm Edwards Sapien 3 transcatheter heart valve placed via percutaneous right transfemoral approach   . Severe aortic stenosis   . Spinal stenosis, multilevel     Past Surgical History:   Procedure Laterality Date  . ANTERIOR CERVICAL DECOMP/DISCECTOMY FUSION  11-11-1999   C5 - C6  . APPENDECTOMY  AGE 77  . CARDIAC CATHETERIZATION  2008  [facility]   ESSENTIALLY NORMAL  . CATARACT EXTRACTION Bilateral   . CATARACT EXTRACTION W/ INTRAOCULAR LENS  IMPLANT, BILATERAL  2012  . EYE SURGERY    . FLEXIBLE SIGMOIDOSCOPY N/A 06/01/2013   Procedure: FLEXIBLE SIGMOIDOSCOPY;  Surgeon: Inda Castle, MD;  Location: [facility];  Service: Endoscopy;  Laterality: N/A;  may need hemorrhoidal banding  . HEMORRHOIDECTOMY WITH HEMORRHOID BANDING  08-07-2010  . LAPAROSCOPIC CHOLECYSTECTOMY  1990  . RIGHT/LEFT HEART CATH AND CORONARY ANGIOGRAPHY N/A 09/29/2017   Procedure: RIGHT/LEFT HEART CATH AND CORONARY ANGIOGRAPHY;  Surgeon: Sherren Mocha, MD;  Location: [place] [facility];  Service: Cardiovascular;  Laterality: N/A;  . SHOULDER ARTHROSCOPY WITH OPEN ROTATOR CUFF REPAIR AND DISTAL CLAVICLE ACROMINECTOMY Right 05/25/2012   Procedure: SHOULDER ARTHROSCOPY WITH OPEN ROTATOR CUFF REPAIR AND DISTAL CLAVICLE ACROMINECTOMY;  Surgeon: Magnus Sinning, MD;  Location: [place];  Service: Orthopedics;  Laterality: Right;  RIGHT SHOULDER ARTHROSCOPY WITH DERBRIDEMENTOF LABRAL/BICEP TENDON, OPEN DISTAL CLAVICLE RESECTION, ANTERIOR ACROMINECTOMY ROTATOR CUFF REPAIR ANESTHESIA: GENERAL/SCALENE NERVE BLOCK  . TEE WITHOUT CARDIOVERSION Bilateral 10/19/2017   Procedure: TRANSESOPHAGEAL ECHOCARDIOGRAM (TEE);  Surgeon:  Sherren Mocha, MD;  Location: [place];  Service: Open Heart Surgery;  Laterality: Bilateral;  . TENOSYNOVECTOMY Left 01/04/2014   Procedure: LEFT WRIST EXTENSOR TENOSYNOVECTOMY;  Surgeon: Linna Hoff, MD;  Location: [facility];  Service: Orthopedics;  Laterality: Left;  . THYROIDECTOMY  AGE 68   GOITER  . TONSILLECTOMY AND ADENOIDECTOMY  AGE 31  . TRANSCATHETER AORTIC VALVE REPLACEMENT, TRANSFEMORAL  10/19/2017  . TRANSCATHETER AORTIC VALVE REPLACEMENT,  TRANSFEMORAL Bilateral 10/19/2017   Procedure: TRANSCATHETER AORTIC VALVE REPLACEMENT, TRANSFEMORAL;  Surgeon: Sherren Mocha, MD;  Location: [place];  Service: Open Heart Surgery;  Laterality: Bilateral;  . TRANSTHORACIC ECHOCARDIOGRAM  last one 04-20-2013  DR [facility]    NORMAL LVSF/ EF 60-65%/ MODERATE  AV  STENOSIS WITH NO AR /  MILD LAE  . UMBILICAL HERNIA REPAIR  JUNE 2006  . VAGINAL HYSTERECTOMY  01-31-2001   ANTERIOR & POSTERIOR REPAIR/ TRANSVAGINAL BLADDER SLING    Current Medications: Prior to Admission medications   Medication Sig Start Date End Date Taking? Authorizing Provider  acetaminophen (TYLENOL) 500 MG tablet Take 1,000 mg by mouth daily as needed for moderate pain or headache.    [provider]  ALOE VERA PO Take 1 capsule by mouth daily.    [provider]  aspirin EC 81 MG tablet Take 1 tablet (81 mg total) by mouth daily. 01/12/18   [name], DO  BEE POLLEN PO Take 400 mg by mouth daily.     [provider]  clobetasol cream (TEMOVATE) [id number] % Apply 1 application topically 2 (two) times daily as needed. Patient taking differently: Apply 1 application topically 2 (two) times daily as needed (for rash).  03/19/17   [name], MD  clopidogrel (PLAVIX) 75 MG tablet Take 1 tablet (75 mg total) by mouth daily with breakfast. 01/12/18   [name], DO  Coenzyme Q10 (COQ-10) 100 MG CAPS Take 100 mg by mouth daily.    [provider]  diclofenac sodium (VOLTAREN) 1 % GEL APPLY 3 GRAMS TO 3 LARGE JOINTS UP TO 3 TIMES DIALY 12/14/17   Bo Merino, MD  losartan (COZAAR) 25 MG tablet Take 1 tablet (25 mg total) by mouth daily. 01/17/18   [name], MD  metoprolol tartrate (LOPRESSOR) 50 MG tablet Take 1 tablet (50 mg total) by mouth 2 (two) times daily. 01/17/18   [name], MD  OVER THE COUNTER MEDICATION Take 1 capsule by mouth 2 (two) times daily. Hemp Oil    [provider]  polyethylene glycol (MIRALAX  / GLYCOLAX) packet Take 17 g by mouth daily as needed for moderate constipation.    [provider]  Polyethylene Glycol 400 (BLINK TEARS) 0.25 % SOLN Place 1-2 drops into both eyes 3 (three) times daily.     [provider]  rosuvastatin (CRESTOR) 5 MG tablet Take 1 tablet (5 mg total) by mouth daily. 01/12/18   [name], DO  TURMERIC PO Take 1 capsule by mouth daily.    [provider]  vitamin B-12 (CYANOCOBALAMIN) 1000 MCG tablet Take 1,000 mcg by mouth daily.    [provider]    Allergies:   Tape and Prolia [denosumab]   Social History   Socioeconomic History  . Marital status: Married    Spouse name: Shanon Brow  . Number of children: 6  . Years of education: 6  . Highest education level: 6th grade  Occupational History  . Occupation: Retired  Tobacco Use  .  Smoking status: Former Smoker    Packs/day: 0.25    Years: 5.00    Pack years: 1.25    Types: Cigarettes    Quit date: 04/14/2003    Years since quitting: 16.0  . Smokeless tobacco: Never Used  Substance and Sexual Activity  . Alcohol use: Yes    Comment: once in a while-social  . Drug use: Never    Comment: hemp oil   . Sexual activity: Not Currently  Other Topics Concern  . Not on file  Social History Narrative  . Not on file   Social Determinants of Health   Financial Resource Strain: Low Risk   . Difficulty of Paying Living Expenses: Not hard at all  Food Insecurity: No Food Insecurity  . Worried About Charity fundraiser in the Last Year: Never true  . Ran Out of Food in the Last Year: Never true  Transportation Needs: No Transportation Needs  . Lack of Transportation (Medical): No  . Lack of Transportation (Non-Medical): No  Physical Activity: Inactive  . Days of Exercise per Week: 0 days  . Minutes of Exercise per Session: 0 min  Stress: No Stress Concern Present  . Feeling of Stress : Not at all  Social Connections: Not Isolated  . Frequency of  Communication with Friends and Family: More than three times a week  . Frequency of Social Gatherings with Friends and Family: Once a week  . Attends Religious Services: More than 4 times per year  . Active Member of Clubs or Organizations: Yes  . Attends Archivist Meetings: More than 4 times per year  . Marital Status: Married     Family History:  The patient's family history includes Cancer (age of onset: 11) in her son; Cirrhosis in her mother; Gallbladder disease in her maternal grandmother; Heart disease in her sister; Other in her brother; Stomach cancer in her paternal grandfather.   ROS:   Please see the history of present illness.    ROS All other systems reviewed and are negative.   PHYSICAL EXAM:   VS:  There were no vitals taken for this visit.   Affect appropriate Healthy:  appears stated age 73: normal Neck supple with no adenopathy JVP normal no bruits no thyromegaly Lungs clear with no wheezing and good diaphragmatic motion Heart:  S1/S2 sEM through TAVR  Loud AR  murmur, no rub, gallop or click PMI normal Abdomen: benighn, BS positve, no tenderness, no AAA no bruit.  No HSM or HJR Distal pulses intact with no bruits No edema Neuro non-focal Skin warm and dry No muscular weakness   Wt Readings from Last 3 Encounters:  05/08/19 160 lb (72.6 kg)  04/25/19 164 lb (74.4 kg)  02/10/19 164 lb 6.4 oz (74.6 kg)      Studies/Labs Reviewed:   EKG:   05/10/19  SR rate 82 normal   Recent Labs: 05/08/2019: BUN 22; Creatinine, Ser 0.94; Potassium 5.3; Sodium 143; TSH 2.570   Lipid Panel    Component Value Date/Time   CHOL 145 05/25/2017 0844   CHOL 219 (H) 09/07/2012 0935   TRIG 136 05/25/2017 0844   TRIG 180 (H) 02/17/2013 1007   TRIG 326 (H) 09/07/2012 0935   HDL 47 05/25/2017 0844   HDL 51 02/17/2013 1007   HDL 47 09/07/2012 0935   CHOLHDL 3.1 05/25/2017 0844   CHOLHDL 5.2 10/09/2006 0435   VLDL 58 (H) 10/09/2006 0435   LDLCALC 71  05/25/2017 0844  [place] 89 02/17/2013 1007   LDLCALC 107 (H) 09/07/2012 0935    Additional studies/ records that were reviewed today include:    2D ECHO 11/25/17 ( 1 month s/p TAVR)  Study Conclusion - Left ventricle: The cavity size was normal. There was mild focal basal hypertrophy of the septum. Systolic function was normal. The estimated ejection fraction was in the range of 60% to 65%. Wall motion was normal; there were no regional wall motion abnormalities. Doppler parameters are consistent with abnormal left ventricular relaxation (grade 1 diastolic dysfunction). - Aortic valve: A TAVR bioprosthesis was present and functioning normally. There was mild perivalvular regurgitation. Peak velocity (S): 244 cm/s. Mean gradient (S): 12 mm Hg. - Mitral valve: Severely calcified annulus. - Left atrium: The atrium was mildly dilated Impressions - Compared to the prior study, there has been no significant interval change.   RIGHT/LEFT HEART CATH AND CORONARY ANGIOGRAPHY  09/29/17  Conclusion   1.  Known severe aortic stenosis by noninvasive assessment with heavily calcified and restricted aortic valve leaflets biplane fluoroscopy 2.  Heavy mitral annular calcium 3.  Widely patent coronary arteries with minor luminal irregularities 4.  Mildly elevated right heart pressures   Recommendation: Continued multidisciplinary heart team evaluation for treatment of severe aortic stenosis     ASSESSMENT & PLAN:    1. HTN -improved on higher dose ARB some white coat component and anxiety  2. Severe AS s/p TVAR - suboptimal result with small 20 mm Sapien valve and moderate PVL mean gradient increasing form 12->19 mmHg Discussed SBE prophylaxis f/u echo end of May not clear she would be a candidate for "plugging" procedure should PVL get worse will get cardiac CTA to assess valve for full expansion and r/o thrombosis as well as define mechanism of PVL  3.  Palpitations -  Reviewed Zio monitor done 02/2018 no significant arrhythmia continue beta blocker ECG normal   4. Vertigo:  Meclizine as needed f/u primary   5. Fatigue/Mailaise:  Non cardiac EF is normal 60-65% ECG normal no arrhythmia or heart block labs seem ok   6. HLD:  Continue statin labs with primary    Medication Adjustments/Labs and Tests Ordered: Current medicines are reviewed at length with the patient today.  Concerns regarding medicines are outlined above.  Medication changes, Labs and Tests ordered today are listed in the Patient Instructions below. There are no Patient Instructions on file for this visit.   Signed, Jenkins Rouge, MD  05/09/2019 3:29 PM    [facility] [address], [place], [place]  [address] Phone: [phone]; Fax: [phone]

## 2019-05-10 ENCOUNTER — Ambulatory Visit: Payer: Medicare Other | Admitting: Cardiovascular Disease

## 2019-05-10 ENCOUNTER — Encounter: Payer: Self-pay | Admitting: Cardiovascular Disease

## 2019-05-10 ENCOUNTER — Other Ambulatory Visit: Payer: Self-pay

## 2019-05-10 VITALS — BP 140/60 | HR 102 | Ht 62.0 in | Wt 164.4 lb

## 2019-05-10 DIAGNOSIS — R42 Dizziness and giddiness: Secondary | ICD-10-CM | POA: Diagnosis not present

## 2019-05-10 DIAGNOSIS — Z952 Presence of prosthetic heart valve: Secondary | ICD-10-CM

## 2019-05-10 LAB — BASIC METABOLIC PANEL
BUN/Creatinine Ratio: 26 (ref 12–28)
BUN: 24 mg/dL (ref 8–27)
CO2: 23 mmol/L (ref 20–29)
Calcium: 10 mg/dL (ref 8.7–10.3)
Chloride: 103 mmol/L (ref 96–106)
Creatinine, Ser: 0.94 mg/dL (ref 0.57–1.00)
GFR calc Af Amer: 66 mL/min/{1.73_m2} (ref >59)
GFR calc non Af Amer: 57 mL/min/{1.73_m2} — ABNORMAL LOW (ref >59)
Glucose: 89 mg/dL (ref 65–99)
Potassium: 5.6 mmol/L — ABNORMAL HIGH (ref 3.5–5.2)
Sodium: 139 mmol/L (ref 134–144)

## 2019-05-10 NOTE — Patient Instructions (Signed)
Medication Instructions:  *If you need a refill on your cardiac medications before your next appointment, please call your pharmacy*  Lab Work: Your physician recommends that you have lab work today- BMET  If you have labs (blood work) drawn today and your tests are completely normal, you will receive your results only by: Marland Kitchen MyChart Message (if you have MyChart) OR . A paper copy in the mail If you have any lab test that is abnormal or we need to change your treatment, we will call you to review the results.  Testing/Procedures: Your physician has requested that you have cardiac CT. Cardiac computed tomography (CT) is a painless test that uses an x-ray machine to take clear, detailed pictures of your heart. For further information please visit [email or web address]. Please follow instruction sheet as given.  Follow-Up: At [facility], you and your health needs are our priority.  As part of our continuing mission to provide you with exceptional heart care, we have created designated Provider Care Teams.  These Care Teams include your primary Cardiologist (physician) and Advanced Practice Providers (APPs -  Physician Assistants and Nurse Practitioners) who all work together to provide you with the care you need, when you need it.  Your next appointment:   6 month(s)  The format for your next appointment:   In Person  Provider:   You may see Jenkins Rouge, MD or one of the following Advanced Practice Providers on your designated Care Team:    Truitt Merle, NP  Cecilie Kicks, NP  Kathyrn Drown, NP

## 2019-05-12 ENCOUNTER — Telehealth: Payer: Self-pay

## 2019-05-12 ENCOUNTER — Telehealth: Payer: Self-pay | Admitting: Family Medicine

## 2019-05-12 MED ORDER — AMLODIPINE BESYLATE 10 MG PO TABS: 10.0000 mg | ORAL_TABLET | Freq: Every day | ORAL | 3 refills | Status: DC

## 2019-05-12 NOTE — Telephone Encounter (Signed)
Patient given appt for 02/03 at 4:00pm

## 2019-05-12 NOTE — Telephone Encounter (Signed)
Called patient back about her lab work. Per Dr. Johnsie Cancel, BS is fine Cr fine K still high 5.6 stop losartan and any supplements may contain K needs f/u with primary for this start norvasc 10 mg for her BP since stopping losartan. Patient verbalized understanding.  Sent norvasc to patient's pharmacy of choice.

## 2019-05-12 NOTE — Telephone Encounter (Signed)
-----   Message from [name], MD sent at 05/10/2019  5:23 PM EST ----- BS is fine Cr fine K still high 5.6 stop losartan and any supplements may contain K needs f/u with primary for this start norvasc 10 mg for her BP since stopping losartan

## 2019-05-17 ENCOUNTER — Ambulatory Visit (INDEPENDENT_AMBULATORY_CARE_PROVIDER_SITE_OTHER): Payer: Medicare Other | Admitting: Family Medicine

## 2019-05-17 VITALS — BP 144/57 | HR 65

## 2019-05-17 DIAGNOSIS — R42 Dizziness and giddiness: Secondary | ICD-10-CM

## 2019-05-17 DIAGNOSIS — Z952 Presence of prosthetic heart valve: Secondary | ICD-10-CM | POA: Diagnosis not present

## 2019-05-17 NOTE — Progress Notes (Signed)
Telephone visit  Subjective: CC: Follow-up vertigo PCP: [name], DO HPI:[name] is a 84 y.o. female calls for telephone consult today. Patient provides verbal consent for consult held via phone.  Due to COVID-19 pandemic this visit was conducted virtually. This visit type was conducted due to national recommendations for restrictions regarding the COVID-19 Pandemic (e.g. social distancing, sheltering in place) in an effort to limit this patient's exposure and mitigate transmission in our community. All issues noted in this document were discussed and addressed.  A physical exam was not performed with this format.   Location of patient: Home Location of provider: Working remotely from home Others present for call: None  1.  Vertigo She was seen by her cardiologist on 05/10/2019.  She was noted to be hypokalemic and therefore her losartan was discontinued and replaced by amlodipine 10 mg daily.  She started feeling better Monday.  Dizziness has resolved since then.  Blood pressures have remained under control.  No chest pain.  There are plans to have her seen again by her cardiothoracic surgeon to have a look at the artificial valve.  Though, her cardiologist was not very suspicious that this was the etiology of her vertigo.  ROS: Per HPI  Allergies  Allergen Reactions  . Tape Other (See Comments)    Dermatitis rash "with extended exposure"  . Prolia [Denosumab] Other (See Comments)    Arthralgia/ myalgia/ jaw pain/ headache   Past Medical History:  Diagnosis Date  . Chronic headaches   . Fibromyalgia   . GERD (gastroesophageal reflux disease)   . Hemorrhoids    INTERNAL--  POST BANDING 02/ 2015  . History of kidney stones   . Hyperlipidemia   . Hypertension   . Moderate aortic stenosis   . OSA (obstructive sleep apnea) CPAP INTOLERANT   . Osteoporosis   . S/P TAVR (transcatheter aortic valve replacement) 10/19/2017   20 mm Edwards Sapien 3 transcatheter heart  valve placed via percutaneous right transfemoral approach   . Severe aortic stenosis   . Spinal stenosis, multilevel     Current Outpatient Medications:  .  acetaminophen (TYLENOL) 500 MG tablet, Take 1,000 mg by mouth daily as needed for moderate pain or headache., Disp: , Rfl:  .  alendronate (FOSAMAX) 70 MG tablet, TAKE 1 TABLET (70 MG TOTAL) BY MOUTH EVERY 7 (SEVEN) DAYS. TAKE WITH A FULL GLASS OF WATER ON AN EMPTY STOMACH., Disp: 12 tablet, Rfl: 3 .  ALOE VERA PO, Take 1 capsule by mouth daily., Disp: , Rfl:  .  amLODipine (NORVASC) 10 MG tablet, Take 1 tablet (10 mg total) by mouth daily., Disp: 90 tablet, Rfl: 3 .  Ascorbic Acid (VITAMIN C) 1000 MG tablet, Take 1,000 mg by mouth daily., Disp: , Rfl:  .  aspirin EC 81 MG tablet, Take 1 tablet (81 mg total) by mouth daily., Disp: 90 tablet, Rfl: 3 .  BEE POLLEN PO, Take 400 mg by mouth daily. , Disp: , Rfl:  .  clobetasol cream (TEMOVATE) AB-123456789 %, APPLY 1 APPLICATION TOPICALLY 2 (TWO) TIMES DAILY AS NEEDED., Disp: 30 g, Rfl: 3 .  Coenzyme Q10 (COQ-10) 100 MG CAPS, Take 100 mg by mouth daily., Disp: , Rfl:  .  meclizine (ANTIVERT) 25 MG tablet, Take 0.5-1 tablets (12.5-25 mg total) by mouth 3 (three) times daily as needed for dizziness., Disp: 30 tablet, Rfl: 0 .  metoprolol tartrate (LOPRESSOR) 50 MG tablet, TAKE 1 TABLET BY MOUTH TWICE A DAY, Disp: 180  tablet, Rfl: 1 .  mupirocin ointment (BACTROBAN) 2 %, Apply to the affected area twice daily for 7-10 days, Disp: 22 g, Rfl: 0 .  OVER THE COUNTER MEDICATION, Take 1 capsule by mouth 2 (two) times daily. Hemp Oil, Disp: , Rfl:  .  prednisoLONE acetate (PRED FORTE) 1 % ophthalmic suspension, Place 1 drop into the right eye 4 (four) times daily. (Patient taking differently: Place 1 drop into the right eye 2 (two) times a day. ), Disp: 10 mL, Rfl: 0 .  rosuvastatin (CRESTOR) 5 MG tablet, TAKE 1 TABLET BY MOUTH EVERY DAY, Disp: 90 tablet, Rfl: 0  Blood pressure (!) 144/57, pulse  65.  Assessment/ Plan: 84 y.o. female   1. Vertigo Resolved after change in BP medication.  We discussed that if it recurs, low threshold to have her seen by ear nose and throat.  She will also get an eye exam soon.  She is currently established with Dr. Gershon Crane  2. S/P TAVR (transcatheter aortic valve replacement) Plans for recheck soon.  This is to be arranged by [name].   Start time: 3;32pm End time: 3:43pm  Total time spent on patient care (including telephone call/ virtual visit): 17 minutes  [place], [place] [phone]

## 2019-05-18 DIAGNOSIS — H10013 Acute follicular conjunctivitis, bilateral: Secondary | ICD-10-CM | POA: Diagnosis not present

## 2019-05-19 ENCOUNTER — Other Ambulatory Visit: Payer: Self-pay

## 2019-05-19 DIAGNOSIS — I10 Essential (primary) hypertension: Secondary | ICD-10-CM

## 2019-05-19 DIAGNOSIS — R42 Dizziness and giddiness: Secondary | ICD-10-CM

## 2019-05-19 DIAGNOSIS — R5383 Other fatigue: Secondary | ICD-10-CM

## 2019-05-19 DIAGNOSIS — I350 Nonrheumatic aortic (valve) stenosis: Secondary | ICD-10-CM

## 2019-05-19 DIAGNOSIS — R002 Palpitations: Secondary | ICD-10-CM

## 2019-05-19 DIAGNOSIS — Z952 Presence of prosthetic heart valve: Secondary | ICD-10-CM

## 2019-05-19 NOTE — Progress Notes (Signed)
Placed order for Cardiac CTA for patient.

## 2019-05-25 ENCOUNTER — Other Ambulatory Visit: Payer: Self-pay

## 2019-05-25 DIAGNOSIS — I350 Nonrheumatic aortic (valve) stenosis: Secondary | ICD-10-CM

## 2019-05-25 DIAGNOSIS — Z01812 Encounter for preprocedural laboratory examination: Secondary | ICD-10-CM

## 2019-05-25 NOTE — Progress Notes (Signed)
Needs BMET for CT

## 2019-05-31 ENCOUNTER — Ambulatory Visit: Payer: Medicare Other | Admitting: Cardiovascular Disease

## 2019-06-16 ENCOUNTER — Other Ambulatory Visit: Payer: Self-pay | Admitting: Family Medicine

## 2019-06-22 ENCOUNTER — Other Ambulatory Visit: Payer: Self-pay | Admitting: Cardiovascular Disease

## 2019-06-23 DIAGNOSIS — H35373 Puckering of macula, bilateral: Secondary | ICD-10-CM | POA: Diagnosis not present

## 2019-06-23 DIAGNOSIS — H209 Unspecified iridocyclitis: Secondary | ICD-10-CM | POA: Diagnosis not present

## 2019-06-23 DIAGNOSIS — H4311 Vitreous hemorrhage, right eye: Secondary | ICD-10-CM | POA: Diagnosis not present

## 2019-06-23 DIAGNOSIS — H43813 Vitreous degeneration, bilateral: Secondary | ICD-10-CM | POA: Diagnosis not present

## 2019-06-26 ENCOUNTER — Other Ambulatory Visit (HOSPITAL_COMMUNITY): Payer: Self-pay | Admitting: Cardiovascular Disease

## 2019-06-26 ENCOUNTER — Encounter (HOSPITAL_COMMUNITY): Payer: Self-pay

## 2019-06-26 ENCOUNTER — Other Ambulatory Visit: Payer: Medicare Other | Admitting: *Deleted

## 2019-06-26 ENCOUNTER — Telehealth (HOSPITAL_COMMUNITY): Payer: Self-pay | Admitting: Emergency Medicine

## 2019-06-26 ENCOUNTER — Other Ambulatory Visit: Payer: Self-pay

## 2019-06-26 DIAGNOSIS — I350 Nonrheumatic aortic (valve) stenosis: Secondary | ICD-10-CM

## 2019-06-26 DIAGNOSIS — Z01812 Encounter for preprocedural laboratory examination: Secondary | ICD-10-CM | POA: Diagnosis not present

## 2019-06-26 DIAGNOSIS — I25118 Atherosclerotic heart disease of native coronary artery with other forms of angina pectoris: Secondary | ICD-10-CM

## 2019-06-26 LAB — BASIC METABOLIC PANEL
BUN/Creatinine Ratio: 16 (ref 12–28)
BUN: 13 mg/dL (ref 8–27)
CO2: 28 mmol/L (ref 20–29)
Calcium: 9.7 mg/dL (ref 8.7–10.3)
Chloride: 99 mmol/L (ref 96–106)
Creatinine, Ser: 0.82 mg/dL (ref 0.57–1.00)
GFR calc Af Amer: 78 mL/min/{1.73_m2} (ref >59)
GFR calc non Af Amer: 67 mL/min/{1.73_m2} (ref >59)
Glucose: 104 mg/dL — ABNORMAL HIGH (ref 65–99)
Potassium: 4.6 mmol/L (ref 3.5–5.2)
Sodium: 140 mmol/L (ref 134–144)

## 2019-06-26 NOTE — Telephone Encounter (Signed)
Reaching out to patient to offer assistance regarding upcoming cardiac imaging study; pt verbalizes understanding of appt date/time, parking situation and where to check in, pre-test NPO status and medications ordered, and verified current allergies; name and call back number provided for further questions should they arise Megan Bond RN [facility] [phone] office [phone] cell  Pt states she is a difficult stick. Making appt for micropuncture, pt verbalized understanding.   appt : 12 noon (pt told to check in 11:45a)  Pt also reminded to have pre-test labs drawn ; pt appreciated the call.  [name]

## 2019-06-27 ENCOUNTER — Encounter: Payer: Self-pay | Admitting: Radiology

## 2019-06-27 ENCOUNTER — Other Ambulatory Visit (HOSPITAL_COMMUNITY): Payer: Self-pay | Admitting: Cardiovascular Disease

## 2019-06-27 ENCOUNTER — Ambulatory Visit (HOSPITAL_COMMUNITY)
Admission: RE | Admit: 2019-06-27 | Discharge: 2019-06-27 | Disposition: A | Discharge status: Home / Self Care | Payer: Medicare Other | Source: Ambulatory Visit | Attending: Cardiovascular Disease | Admitting: Cardiovascular Disease

## 2019-06-27 DIAGNOSIS — R5383 Other fatigue: Secondary | ICD-10-CM | POA: Insufficient documentation

## 2019-06-27 DIAGNOSIS — Z952 Presence of prosthetic heart valve: Secondary | ICD-10-CM

## 2019-06-27 DIAGNOSIS — I350 Nonrheumatic aortic (valve) stenosis: Secondary | ICD-10-CM

## 2019-06-27 DIAGNOSIS — I25709 Atherosclerosis of coronary artery bypass graft(s), unspecified, with unspecified angina pectoris: Secondary | ICD-10-CM | POA: Insufficient documentation

## 2019-06-27 DIAGNOSIS — I25118 Atherosclerotic heart disease of native coronary artery with other forms of angina pectoris: Secondary | ICD-10-CM

## 2019-06-27 DIAGNOSIS — R002 Palpitations: Secondary | ICD-10-CM | POA: Diagnosis not present

## 2019-06-27 DIAGNOSIS — I10 Essential (primary) hypertension: Secondary | ICD-10-CM | POA: Insufficient documentation

## 2019-06-27 DIAGNOSIS — Z452 Encounter for adjustment and management of vascular access device: Secondary | ICD-10-CM | POA: Diagnosis not present

## 2019-06-27 DIAGNOSIS — R42 Dizziness and giddiness: Secondary | ICD-10-CM | POA: Diagnosis not present

## 2019-06-27 HISTORY — PX: IR US GUIDE VASC ACCESS RIGHT: IMG2390

## 2019-06-27 HISTORY — PX: IR RADIOLOGY PERIPHERAL GUIDED IV START: IMG5598

## 2019-06-27 MED ORDER — IOHEXOL 350 MG/ML SOLN
100.0000 mL | Freq: Once | INTRAVENOUS | Status: AC | PRN
  Administered 2019-06-27: 100 mL via INTRAVENOUS

## 2019-06-27 MED ORDER — LIDOCAINE HCL 1 % IJ SOLN
INTRAMUSCULAR | Status: AC
  Filled 2019-06-27: qty 20

## 2019-06-27 MED ORDER — LIDOCAINE HCL (PF) 1 % IJ SOLN
INTRAMUSCULAR | Status: DC | PRN
  Administered 2019-06-27: 10 mL

## 2019-06-27 NOTE — Procedures (Signed)
basicllic vein single lumen IV placed without immediate complications. [name] to use. Medication used - 1& lidocaine and subcutaneous tissues. EBL < 2 ml.

## 2019-06-29 ENCOUNTER — Telehealth: Payer: Self-pay

## 2019-06-29 ENCOUNTER — Telehealth: Payer: Self-pay | Admitting: Family Medicine

## 2019-06-29 DIAGNOSIS — I350 Nonrheumatic aortic (valve) stenosis: Secondary | ICD-10-CM

## 2019-06-29 NOTE — Telephone Encounter (Signed)
-----   Message from [name], MD sent at 06/28/2019 11:34 PM EDT ----- No thrombus valve expanded f/u echo in a year

## 2019-06-29 NOTE — Telephone Encounter (Signed)
Patient aware of results. Placed order for echo in one year.

## 2019-06-29 NOTE — Telephone Encounter (Signed)
Patient reports she has what she thinks is an ingrown hair between her leg and groin area.  Wants to see Dr. Lajuana Ripple to let her look at it and see if she needs antibiotic or what.  Appointment scheduled 07/03/2019 at 3:30 pm, patient aware.

## 2019-06-30 ENCOUNTER — Ambulatory Visit: Payer: Medicare Other | Admitting: Orthopedic Surgery

## 2019-07-03 ENCOUNTER — Other Ambulatory Visit: Payer: Self-pay

## 2019-07-03 ENCOUNTER — Encounter: Payer: Self-pay | Admitting: Family Medicine

## 2019-07-03 ENCOUNTER — Ambulatory Visit (INDEPENDENT_AMBULATORY_CARE_PROVIDER_SITE_OTHER): Payer: Medicare Other | Admitting: Family Medicine

## 2019-07-03 VITALS — BP 131/51 | HR 66 | Temp 98.5°F | Ht 62.0 in | Wt 161.0 lb

## 2019-07-03 DIAGNOSIS — L720 Epidermal cyst: Secondary | ICD-10-CM

## 2019-07-03 DIAGNOSIS — L731 Pseudofolliculitis barbae: Secondary | ICD-10-CM | POA: Diagnosis not present

## 2019-07-03 NOTE — Progress Notes (Signed)
Subjective: CC: ?ingrown hair PCP: [name], DO HPI:[name] is a 84 y.o. female presenting to clinic today for:  1.  Ingrown hair Patient reports she felt some bumps in her left groin area.  These been present for about 2 weeks.  Sometimes they are irritated.  Denies any discharge or bleeding.  She has a similar lesion in the right buttock in her "crack".  She notes this lesion has been there for several years.  She picks at it and extrudes the blackhead but it always comes back.  Denies any drainage, pain.  She tried using colloidal silver on these areas.   ROS: Per HPI  Allergies  Allergen Reactions  . Tape Other (See Comments)    Dermatitis rash "with extended exposure"  . Prolia [Denosumab] Other (See Comments)    Arthralgia/ myalgia/ jaw pain/ headache   Past Medical History:  Diagnosis Date  . Chronic headaches   . Fibromyalgia   . GERD (gastroesophageal reflux disease)   . Hemorrhoids    INTERNAL--  POST BANDING 02/ 2015  . History of kidney stones   . Hyperlipidemia   . Hypertension   . Moderate aortic stenosis   . OSA (obstructive sleep apnea) CPAP INTOLERANT   . Osteoporosis   . S/P TAVR (transcatheter aortic valve replacement) 10/19/2017   20 mm Edwards Sapien 3 transcatheter heart valve placed via percutaneous right transfemoral approach   . Severe aortic stenosis   . Spinal stenosis, multilevel     Current Outpatient Medications:  .  acetaminophen (TYLENOL) 500 MG tablet, Take 1,000 mg by mouth daily as needed for moderate pain or headache., Disp: , Rfl:  .  alendronate (FOSAMAX) 70 MG tablet, TAKE 1 TABLET (70 MG TOTAL) BY MOUTH EVERY 7 (SEVEN) DAYS. TAKE WITH A FULL GLASS OF WATER ON AN EMPTY STOMACH., Disp: 12 tablet, Rfl: 3 .  ALOE VERA PO, Take 1 capsule by mouth daily., Disp: , Rfl:  .  amLODipine (NORVASC) 10 MG tablet, Take 1 tablet (10 mg total) by mouth daily., Disp: 90 tablet, Rfl: 3 .  Ascorbic Acid (VITAMIN C) 1000 MG tablet,  Take 1,000 mg by mouth daily., Disp: , Rfl:  .  aspirin EC 81 MG tablet, Take 1 tablet (81 mg total) by mouth daily., Disp: 90 tablet, Rfl: 3 .  BEE POLLEN PO, Take 400 mg by mouth daily. , Disp: , Rfl:  .  clobetasol cream (TEMOVATE) AB-123456789 %, APPLY 1 APPLICATION TOPICALLY 2 (TWO) TIMES DAILY AS NEEDED., Disp: 30 g, Rfl: 3 .  Coenzyme Q10 (COQ-10) 100 MG CAPS, Take 100 mg by mouth daily., Disp: , Rfl:  .  meclizine (ANTIVERT) 25 MG tablet, Take 0.5-1 tablets (12.5-25 mg total) by mouth 3 (three) times daily as needed for dizziness., Disp: 30 tablet, Rfl: 0 .  metoprolol tartrate (LOPRESSOR) 50 MG tablet, TAKE 1 TABLET BY MOUTH TWICE A DAY, Disp: 180 tablet, Rfl: 1 .  mupirocin ointment (BACTROBAN) 2 %, Apply to the affected area twice daily for 7-10 days, Disp: 22 g, Rfl: 0 .  OVER THE COUNTER MEDICATION, Take 1 capsule by mouth 2 (two) times daily. Hemp Oil, Disp: , Rfl:  .  prednisoLONE acetate (PRED FORTE) 1 % ophthalmic suspension, Place 1 drop into the right eye 4 (four) times daily. (Patient taking differently: Place 1 drop into the right eye 2 (two) times a day. ), Disp: 10 mL, Rfl: 0 .  rosuvastatin (CRESTOR) 5 MG tablet, TAKE 1 TABLET  BY MOUTH EVERY DAY, Disp: 90 tablet, Rfl: 0 Social History   Socioeconomic History  . Marital status: Married    Spouse name: Shanon Brow  . Number of children: 6  . Years of education: 6  . Highest education level: 6th grade  Occupational History  . Occupation: Retired  Tobacco Use  . Smoking status: Former Smoker    Packs/day: 0.25    Years: 5.00    Pack years: 1.25    Types: Cigarettes    Quit date: 04/14/2003    Years since quitting: 16.2  . Smokeless tobacco: Never Used  Substance and Sexual Activity  . Alcohol use: Yes    Comment: once in a while-social  . Drug use: Never    Comment: hemp oil   . Sexual activity: Not Currently  Other Topics Concern  . Not on file  Social History Narrative  . Not on file   Social Determinants of Health    Financial Resource Strain: Low Risk   . Difficulty of Paying Living Expenses: Not hard at all  Food Insecurity: No Food Insecurity  . Worried About Charity fundraiser in the Last Year: Never true  . Ran Out of Food in the Last Year: Never true  Transportation Needs: No Transportation Needs  . Lack of Transportation (Medical): No  . Lack of Transportation (Non-Medical): No  Physical Activity: Inactive  . Days of Exercise per Week: 0 days  . Minutes of Exercise per Session: 0 min  Stress: No Stress Concern Present  . Feeling of Stress : Not at all  Social Connections: Not Isolated  . Frequency of Communication with Friends and Family: More than three times a week  . Frequency of Social Gatherings with Friends and Family: Once a week  . Attends Religious Services: More than 4 times per year  . Active Member of Clubs or Organizations: Yes  . Attends Archivist Meetings: More than 4 times per year  . Marital Status: Married  Human resources officer Violence: Not At Risk  . Fear of Current or Ex-Partner: No  . Emotionally Abused: No  . Physically Abused: No  . Sexually Abused: No   Family History  Problem Relation Age of Onset  . Cirrhosis Mother        drinking  . Other Brother        duodenal ulcer  . Heart disease Sister   . Cancer Son 3       colon  . Gallbladder disease Maternal Grandmother   . Stomach cancer Paternal Grandfather     Objective: Office vital signs reviewed. BP (!) 131/51   Pulse 66   Temp 98.5 F (36.9 C) (Temporal)   Ht 5\' 2"  (1.575 m)   Wt 161 lb (73 kg)   SpO2 97%   BMI 29.45 kg/m   Physical Examination:  General: Awake, alert, grey hue to skin. No acute distress GU: 3 ingrown hairs noted in the left vagina.  No evidence of secondary infection.  She has an epidermal cyst noted in the right gluteal cleft.  Again no evidence of infection.  No active drainage or bleeding.  Assessment/ Plan: 84 y.o. female   1. Ingrown  hair Reassurance.  Advised to use warm compresses multiple times a day to the affected area.  Avoid shaving.  Try and use loose clothing.  Discussed signs and symptoms of infection reasons for return.  She voiced understanding  2. Epidermal inclusion cyst As above   No orders of  the defined types were placed in this encounter.  No orders of the defined types were placed in this encounter.    [name], DO [place] [phone]

## 2019-07-03 NOTE — Patient Instructions (Signed)
Ingrown Hair  An ingrown hair is a hair that curls and re-enters the skin instead of growing straight out of the skin. An ingrown hair can develop in any part of the skin that hair is removed from. An ingrown hair may cause small pockets of infection. What are the causes? An ingrown hair may be caused by:  Shaving.  Tweezing.  Waxing.  Using a hair removal cream. What increases the risk? You are more likely to develop this condition if you have curly hair. What are the signs or symptoms? Symptoms of an ingrown hair may include:  Small bumps on the skin. The bumps may be filled with pus.  Pain.  Itching. How is this diagnosed? An ingrown hair is diagnosed by a skin exam. How is this treated? Treatment is often not needed unless the ingrown hair has caused an infection. If needed, treatment may include:  Applying prescription creams to the skin. This can help with inflammation.  Applying warm compresses to the skin. This can help soften the skin.  Taking antibiotic medicine. An antibiotic may be prescribed if the infection is severe.  Retracting and releasing the ingrown hair tips. Follow these instructions at home:  Medicines  Take, apply, or use over-the-counter and prescription medicines only as told by your health care provider. This includes any prescription creams.  If you were prescribed an antibiotic medicine, take it as told by your health care provider. Do not stop using the antibiotic even if you start to feel better. General instructions  Do not shave irritated areas of skin. You may start shaving these areas again once the irritation has gone away.  To help remove ingrown hairs on your face, you may use a facial sponge in a gentle circular motion.  Do not pick or squeeze the irritated area of skin as this may cause infection and scarring. Managing pain and swelling  If directed, apply heat to the affected area as often as told by your health care  provider. Use the heat source that your health care provider recommends, such as a moist heat pack or a heating pad. ? Place a towel between your skin and the heat source. ? Leave the heat on for 20-30 minutes. ? Remove the heat if your skin turns bright red. This is especially important if you are unable to feel pain, heat, or cold. You may have a greater risk of getting burned. How is this prevented?  Shower before shaving.  Wrap areas that you are going to shave in warm, moist wraps for several minutes before shaving. The warmth and moisture help to soften the hairs and makes ingrown hairs less likely.  Use thick shaving gels.  Use a razor that cuts hair slightly above your skin, or use an electric shaver with a long shave setting.  Shave in the direction of hair growth.  Avoid making multiple razor strokes.  Apply a moisturizing lotion after shaving. Summary  An ingrown hair is a hair that curls and re-enters the skin instead of growing straight out of the skin.  Treatment is often not needed unless the ingrown hair has caused an infection.  Take, apply, or use over-the-counter and prescription medicines only as told by your health care provider. This includes any prescription creams.  Do not shave irritated areas of skin. You may start shaving these areas again once the irritation has gone away.  If directed, apply heat to the affected area. Use the heat source that your health care provider  recommends, such as a moist heat pack or a heating pad. °This information is not intended to replace advice given to you by your health care provider. Make sure you discuss any questions you have with your health care provider. °Document Revised: 10/06/2017 Document Reviewed: 10/06/2017 °Elsevier Patient Education © 2020 [employer] ° ° ° °Epidermal Cyst ° °An epidermal cyst is a small, painless lump under your skin. The cyst contains a grayish-white, bad-smelling substance (keratin). Do not  try to pop or open an epidermal cyst yourself. °What are the causes? °· A blocked hair follicle. °· A hair that curls and re-enters the skin instead of growing straight out of the skin. °· A blocked pore. °· Irritated skin. °· An injury to the skin. °· Certain conditions that are passed along from parent to child (inherited). °· Human papillomavirus (HPV). °· Long-term sun damage to the skin. °What increases the risk? °· Having acne. °· Being overweight. °· Being 30-40 years old. °What are the signs or symptoms? °These cysts are usually harmless, but they can get infected. Symptoms of infection may include: °· Redness. °· Inflammation. °· Tenderness. °· Warmth. °· Fever. °· A grayish-white, bad-smelling substance drains from the cyst. °· Pus drains from the cyst. °How is this treated? °In many cases, epidermal cysts go away on their own without treatment. If a cyst becomes infected, treatment may include: °· Opening and draining the cyst, done by a doctor. After draining, you may need minor surgery to remove the rest of the cyst. °· Antibiotic medicine. °· Shots of medicines (steroids) that help to reduce inflammation. °· Surgery to remove the cyst. Surgery may be done if the cyst: °? Becomes large. °? Bothers you. °? Has a chance of turning into cancer. °· Do not try to open a cyst yourself. °Follow these instructions at home: °· Take over-the-counter and prescription medicines only as told by your doctor. °· If you were prescribed an antibiotic medicine, take it it as told by your doctor. Do not stop using the antibiotic even if you start to feel better. °· Keep the area around your cyst clean and dry. °· Wear loose, dry clothing. °· Avoid touching your cyst. °· Check your cyst every day for signs of infection. Check for: °? Redness, swelling, or pain. °? Fluid or blood. °? Warmth. °? Pus or a bad smell. °· Keep all follow-up visits as told by your doctor. This is important. °How is this prevented? °· Wear clean,  dry, clothing. °· Avoid wearing tight clothing. °· Keep your skin clean and dry. Take showers or baths every day. °Contact a doctor if: °· Your cyst has symptoms of infection. °· Your condition does not improve or gets worse. °· You have a cyst that looks different from other cysts you have had. °· You have a fever. °Get help right away if: °· Redness spreads from the cyst into the area close by. °Summary °· An epidermal cyst is a sac made of skin tissue. °· If a cyst becomes infected, treatment may include surgery to open and drain the cyst, or to remove it. °· Take over-the-counter and prescription medicines only as told by your doctor. °· Contact a doctor if your condition is not improving or is getting worse. °· Keep all follow-up visits as told by your doctor. This is important. °This information is not intended to replace advice given to you by your health care provider. Make sure you discuss any questions you have with your health care   provider. °Document Revised: 07/21/2018 Document Reviewed: 01/06/2018 °Elsevier Patient Education © 2020 Elsevier Inc. ° °

## 2019-07-06 ENCOUNTER — Ambulatory Visit (INDEPENDENT_AMBULATORY_CARE_PROVIDER_SITE_OTHER): Payer: Medicare Other | Admitting: *Deleted

## 2019-07-06 DIAGNOSIS — I10 Essential (primary) hypertension: Secondary | ICD-10-CM | POA: Diagnosis not present

## 2019-07-06 NOTE — Patient Instructions (Signed)
Visit Information  Goals Addressed            This Visit's Progress   . Blood Pressure Management       CARE PLAN ENTRY (see longtitudinal plan of care for additional care plan information)  Current Barriers:  . Chronic Disease Management support and education needs related to hypertension  Nurse Case Manager Clinical Goal(s):  Marland Kitchen Over the next 90 days, patient will verbalize basic understanding of hypertension disease process and self health management plan as evidenced by checking blood pressure daily  Interventions:  . Evaluation of current treatment plan related to hypertension and patient's adherence to plan as established by provider. . Provided education to patient re: low sodium diet . Reviewed medications with patient and discussed metoprolol and amlodipine . Discussed plans with patient for ongoing care management follow up and provided patient with direct contact information for care management team . Advised patient, providing education and rationale, to monitor blood pressure daily and record, calling [phone] for findings outside established parameters.   Patient Self Care Activities:  . Performs ADL's independently . Performs IADL's independently  Initial goal documentation        The patient verbalized understanding of instructions provided today and declined a print copy of patient instruction materials.   Follow-up Plan The care management team will reach out to the patient again over the next 90 days.   Chong Sicilian, BSN, RN-BC Embedded Chronic Care Manager [facility] [place] [facility] [facility] Direct Dial: [phone]

## 2019-07-06 NOTE — Chronic Care Management (AMB) (Signed)
Chronic Care Management   Follow Up Note   07/06/2019 Name: [name] MRN: [id number] DOB: [birth year]  Referred by: Megan Norlander, DO Reason for referral : Chronic Care Management (RN Follow up)   [name] is a 84 y.o. year old female who is a primary care patient of Megan Norlander, DO. The CCM team was consulted for assistance with chronic disease management and care coordination needs.    Review of patient status, including review of consultants reports, relevant laboratory and other test results, and collaboration with appropriate care team members and the patient's provider was performed as part of comprehensive patient evaluation and provision of chronic care management services.    SDOH (Social Determinants of Health) assessments performed: No See Care Plan activities for detailed interventions related to [name]    I spoke with [name] by telephone today regarding her chronic medical conditions.   Outpatient Encounter Medications as of 07/06/2019  Medication Sig  . acetaminophen (TYLENOL) 500 MG tablet Take 1,000 mg by mouth daily as needed for moderate pain or headache.  . alendronate (FOSAMAX) 70 MG tablet TAKE 1 TABLET (70 MG TOTAL) BY MOUTH EVERY 7 (SEVEN) DAYS. TAKE WITH A FULL GLASS OF WATER ON AN EMPTY STOMACH.  Marland Kitchen ALOE VERA PO Take 1 capsule by mouth daily.  Marland Kitchen amLODipine (NORVASC) 10 MG tablet Take 1 tablet (10 mg total) by mouth daily.  . Ascorbic Acid (VITAMIN C) 1000 MG tablet Take 1,000 mg by mouth daily.  Marland Kitchen aspirin EC 81 MG tablet Take 1 tablet (81 mg total) by mouth daily.  Marland Kitchen BEE POLLEN PO Take 400 mg by mouth daily.   . clobetasol cream (TEMOVATE) [id number] % APPLY 1 APPLICATION TOPICALLY 2 (TWO) TIMES DAILY AS NEEDED.  Marland Kitchen Coenzyme Q10 (COQ-10) 100 MG CAPS Take 100 mg by mouth daily.  . meclizine (ANTIVERT) 25 MG tablet Take 0.5-1 tablets (12.5-25 mg total) by mouth 3 (three) times daily as needed for dizziness.  . metoprolol tartrate (LOPRESSOR) 50 MG  tablet TAKE 1 TABLET BY MOUTH TWICE A DAY  . mupirocin ointment (BACTROBAN) 2 % Apply to the affected area twice daily for 7-10 days  . OVER THE COUNTER MEDICATION Take 1 capsule by mouth 2 (two) times daily. Hemp Oil  . prednisoLONE acetate (PRED FORTE) 1 % ophthalmic suspension Place 1 drop into the right eye 4 (four) times daily. (Patient taking differently: Place 1 drop into the right eye 2 (two) times a day. )  . rosuvastatin (CRESTOR) 5 MG tablet TAKE 1 TABLET BY MOUTH EVERY DAY   No facility-administered encounter medications on file as of 07/06/2019.     Objective:   Goals Addressed            This Visit's Progress   . Blood Pressure Management       CARE PLAN ENTRY (see longtitudinal plan of care for additional care plan information)  Current Barriers:  . Chronic Disease Management support and education needs related to hypertension  Nurse Case Manager Clinical Goal(s):  Marland Kitchen Over the next 90 days, patient will verbalize basic understanding of hypertension disease process and self health management plan as evidenced by checking blood pressure daily  Interventions:  . Evaluation of current treatment plan related to hypertension and patient's adherence to plan as established by provider. . Provided education to patient re: low sodium diet . Reviewed medications with patient and discussed metoprolol and amlodipine . Discussed plans with patient for ongoing care management follow  up and provided patient with direct contact information for care management team . Advised patient, providing education and rationale, to monitor blood pressure daily and record, calling [phone] for findings outside established parameters.   Patient Self Care Activities:  . Performs ADL's independently . Performs IADL's independently  Initial goal documentation         Plan:   The care management team will reach out to the patient again over the next 90 days.    Chong Sicilian, BSN,  RN-BC Embedded Chronic Care Manager [facility] [facility] Direct Dial: [phone]

## 2019-08-07 ENCOUNTER — Other Ambulatory Visit: Payer: Self-pay | Admitting: Cardiovascular Disease

## 2019-08-07 ENCOUNTER — Other Ambulatory Visit: Payer: Self-pay | Admitting: Family Medicine

## 2019-08-14 ENCOUNTER — Ambulatory Visit (INDEPENDENT_AMBULATORY_CARE_PROVIDER_SITE_OTHER): Payer: Medicare Other | Admitting: Nurse Practitioner

## 2019-08-14 ENCOUNTER — Other Ambulatory Visit: Payer: Self-pay

## 2019-08-14 ENCOUNTER — Encounter: Payer: Self-pay | Admitting: Nurse Practitioner

## 2019-08-14 VITALS — BP 136/52 | HR 69 | Temp 97.9°F | Resp 20 | Ht 62.0 in | Wt 163.0 lb

## 2019-08-14 DIAGNOSIS — R600 Localized edema: Secondary | ICD-10-CM | POA: Insufficient documentation

## 2019-08-14 MED ORDER — FUROSEMIDE 20 MG PO TABS: 20.0000 mg | ORAL_TABLET | Freq: Every day | ORAL | 0 refills | Status: DC

## 2019-08-14 NOTE — Progress Notes (Signed)
Acute Office Visit  Subjective:    Patient ID: [name], female    DOB: [birth year], 84 y.o.   MRN: [id number]  Chief Complaint  Patient presents with  . Edema    bilateral lower extremties     HPI Patient is in today for for bilateral +3 pitting edema with left leg greater than right. Patient reports that edema is not new but the left leg has never been this swollen. This has been ongoing in the last 3-5 days. Pain is 4 out of 10. Patient is unable to wear her shoes and is walking with a limp. The leg is not warm to touch or discolored.  She is not reporting elevated blood pressures at home, fatigue or any cardiac symptoms. No recent changes to diet or new medication. Patient has not used any medication to alleviate symptoms.  Past Medical History:  Diagnosis Date  . Chronic headaches   . Fibromyalgia   . GERD (gastroesophageal reflux disease)   . Hemorrhoids    INTERNAL--  POST BANDING 02/ 2015  . History of kidney stones   . Hyperlipidemia   . Hypertension   . Moderate aortic stenosis   . OSA (obstructive sleep apnea) CPAP INTOLERANT   . Osteoporosis   . S/P TAVR (transcatheter aortic valve replacement) 10/19/2017   20 mm Edwards Sapien 3 transcatheter heart valve placed via percutaneous right transfemoral approach   . Severe aortic stenosis   . Spinal stenosis, multilevel     Past Surgical History:  Procedure Laterality Date  . ANTERIOR CERVICAL DECOMP/DISCECTOMY FUSION  11-11-1999   C5 - C6  . APPENDECTOMY  AGE 80  . CARDIAC CATHETERIZATION  2008  DR [facility]   ESSENTIALLY NORMAL  . CATARACT EXTRACTION Bilateral   . CATARACT EXTRACTION W/ INTRAOCULAR LENS  IMPLANT, BILATERAL  2012  . EYE SURGERY    . FLEXIBLE SIGMOIDOSCOPY N/A 06/01/2013   Procedure: FLEXIBLE SIGMOIDOSCOPY;  Surgeon: Inda Castle, MD;  Location: [facility];  Service: Endoscopy;  Laterality: N/A;  may need hemorrhoidal banding  . HEMORRHOIDECTOMY WITH HEMORRHOID BANDING  08-07-2010  . IR  RADIOLOGY PERIPHERAL GUIDED IV START  06/27/2019  . IR US GUIDE VASC ACCESS RIGHT  06/27/2019  . LAPAROSCOPIC CHOLECYSTECTOMY  1990  . RIGHT/LEFT HEART CATH AND CORONARY ANGIOGRAPHY N/A 09/29/2017   Procedure: RIGHT/LEFT HEART CATH AND CORONARY ANGIOGRAPHY;  Surgeon: Sherren Mocha, MD;  Location: [place] [facility];  Service: Cardiovascular;  Laterality: N/A;  . SHOULDER ARTHROSCOPY WITH OPEN ROTATOR CUFF REPAIR AND DISTAL CLAVICLE ACROMINECTOMY Right 05/25/2012   Procedure: SHOULDER ARTHROSCOPY WITH OPEN ROTATOR CUFF REPAIR AND DISTAL CLAVICLE ACROMINECTOMY;  Surgeon: Magnus Sinning, MD;  Location: [place];  Service: Orthopedics;  Laterality: Right;  RIGHT SHOULDER ARTHROSCOPY WITH DERBRIDEMENTOF LABRAL/BICEP TENDON, OPEN DISTAL CLAVICLE RESECTION, ANTERIOR ACROMINECTOMY ROTATOR CUFF REPAIR ANESTHESIA: GENERAL/SCALENE NERVE BLOCK  . TEE WITHOUT CARDIOVERSION Bilateral 10/19/2017   Procedure: TRANSESOPHAGEAL ECHOCARDIOGRAM (TEE);  Surgeon: Sherren Mocha, MD;  Location: [place];  Service: Open Heart Surgery;  Laterality: Bilateral;  . TENOSYNOVECTOMY Left 01/04/2014   Procedure: LEFT WRIST EXTENSOR TENOSYNOVECTOMY;  Surgeon: Linna Hoff, MD;  Location: [facility];  Service: Orthopedics;  Laterality: Left;  . THYROIDECTOMY  AGE 76   GOITER  . TONSILLECTOMY AND ADENOIDECTOMY  AGE 76  . TRANSCATHETER AORTIC VALVE REPLACEMENT, TRANSFEMORAL  10/19/2017  . TRANSCATHETER AORTIC VALVE REPLACEMENT, TRANSFEMORAL Bilateral 10/19/2017   Procedure: TRANSCATHETER AORTIC VALVE REPLACEMENT, TRANSFEMORAL;  Surgeon: Sherren Mocha, MD;  Location: [facility];  Service: Open Heart Surgery;  Laterality: Bilateral;  . TRANSTHORACIC ECHOCARDIOGRAM  last one 04-20-2013  DR [facility]    NORMAL LVSF/ EF 60-65%/ MODERATE  AV  STENOSIS WITH NO AR /  MILD LAE  . UMBILICAL HERNIA REPAIR  JUNE 2006  . VAGINAL HYSTERECTOMY  01-31-2001   ANTERIOR & POSTERIOR REPAIR/ TRANSVAGINAL BLADDER SLING     Family History  Problem Relation Age of Onset  . Cirrhosis Mother        drinking  . Other Brother        duodenal ulcer  . Heart disease Sister   . Cancer Son 69       colon  . Gallbladder disease Maternal Grandmother   . Stomach cancer Paternal Grandfather     Social History   Socioeconomic History  . Marital status: Married    Spouse name: Shanon Brow  . Number of children: 6  . Years of education: 6  . Highest education level: 6th grade  Occupational History  . Occupation: Retired  Tobacco Use  . Smoking status: Former Smoker    Packs/day: 0.25    Years: 5.00    Pack years: 1.25    Types: Cigarettes    Quit date: 04/14/2003    Years since quitting: 16.3  . Smokeless tobacco: Never Used  Substance and Sexual Activity  . Alcohol use: Yes    Comment: once in a while-social  . Drug use: Never    Comment: hemp oil   . Sexual activity: Not Currently  Other Topics Concern  . Not on file  Social History Narrative  . Not on file   Social Determinants of Health   Financial Resource Strain: Low Risk   . Difficulty of Paying Living Expenses: Not hard at all  Food Insecurity: No Food Insecurity  . Worried About Charity fundraiser in the Last Year: Never true  . Ran Out of Food in the Last Year: Never true  Transportation Needs: No Transportation Needs  . Lack of Transportation (Medical): No  . Lack of Transportation (Non-Medical): No  Physical Activity: Inactive  . Days of Exercise per Week: 0 days  . Minutes of Exercise per Session: 0 min  Stress: No Stress Concern Present  . Feeling of Stress : Not at all  Social Connections: Not Isolated  . Frequency of Communication with Friends and Family: More than three times a week  . Frequency of Social Gatherings with Friends and Family: Once a week  . Attends Religious Services: More than 4 times per year  . Active Member of Clubs or Organizations: Yes  . Attends Archivist Meetings: More than 4 times per year   . Marital Status: Married  Human resources officer Violence: Not At Risk  . Fear of Current or Ex-Partner: No  . Emotionally Abused: No  . Physically Abused: No  . Sexually Abused: No    Outpatient Medications Prior to Visit  Medication Sig Dispense Refill  . acetaminophen (TYLENOL) 500 MG tablet Take 1,000 mg by mouth daily as needed for moderate pain or headache.    . ALOE VERA PO Take 1 capsule by mouth daily.    Marland Kitchen amLODipine (NORVASC) 10 MG tablet Take 1 tablet (10 mg total) by mouth daily. 90 tablet 3  . Ascorbic Acid (VITAMIN C) 1000 MG tablet Take 1,000 mg by mouth daily.    Marland Kitchen aspirin EC 81 MG tablet Take 1 tablet (81 mg total) by mouth daily. 90 tablet  3  . BEE POLLEN PO Take 400 mg by mouth daily.     . clobetasol cream (TEMOVATE) [id number] % APPLY 1 APPLICATION TOPICALLY 2 (TWO) TIMES DAILY AS NEEDED. 30 g 3  . Coenzyme Q10 (COQ-10) 100 MG CAPS Take 100 mg by mouth daily.    . meclizine (ANTIVERT) 25 MG tablet Take 0.5-1 tablets (12.5-25 mg total) by mouth 3 (three) times daily as needed for dizziness. 30 tablet 0  . metoprolol tartrate (LOPRESSOR) 50 MG tablet TAKE 1 TABLET BY MOUTH TWICE A DAY 180 tablet 1  . mupirocin ointment (BACTROBAN) 2 % Apply to the affected area twice daily for 7-10 days 22 g 0  . OVER THE COUNTER MEDICATION Take 1 capsule by mouth 2 (two) times daily. Hemp Oil    . prednisoLONE acetate (PRED FORTE) 1 % ophthalmic suspension Place 1 drop into the right eye 4 (four) times daily. (Patient taking differently: Place 1 drop into the right eye 2 (two) times a day. ) 10 mL 0  . rosuvastatin (CRESTOR) 5 MG tablet Take 1 tablet (5 mg total) by mouth daily. (Needs to be seen before next refill) 30 tablet 0  . UNABLE TO FIND Med Name: HEMP- Tumeric cream / RUB    . alendronate (FOSAMAX) 70 MG tablet TAKE 1 TABLET (70 MG TOTAL) BY MOUTH EVERY 7 (SEVEN) DAYS. TAKE WITH A FULL GLASS OF WATER ON AN EMPTY STOMACH. 12 tablet 3   No facility-administered medications prior to  visit.    Allergies  Allergen Reactions  . Tape Other (See Comments)    Dermatitis rash "with extended exposure"  . Prolia [Denosumab] Other (See Comments)    Arthralgia/ myalgia/ jaw pain/ headache    Review of Systems  Constitutional: Positive for activity change. Negative for appetite change and fatigue.  Respiratory: Negative for chest tightness and shortness of breath.   Cardiovascular: Positive for leg swelling. Negative for chest pain and palpitations.  Gastrointestinal: Negative.   Genitourinary: Negative for difficulty urinating.  Musculoskeletal: Positive for joint swelling and myalgias.  Skin: Negative for color change and rash.  Neurological: Negative.   Psychiatric/Behavioral: Negative.        Objective:    Physical Exam Constitutional:      Appearance: Normal appearance. She is obese.  HENT:     Head: Normocephalic.     Nose: Nose normal.  Eyes:     Conjunctiva/sclera: Conjunctivae normal.  Cardiovascular:     Rate and Rhythm: Normal rate.     Heart sounds: Murmur present.  Pulmonary:     Effort: Pulmonary effort is normal.     Breath sounds: Normal breath sounds. No wheezing.  Abdominal:     General: Bowel sounds are normal.  Musculoskeletal:        General: Swelling and tenderness present.     Cervical back: Neck supple.     Left lower leg: Edema present.  Skin:    General: Skin is warm.     Findings: No rash.  Neurological:     Mental Status: She is alert and oriented to person, place, and time.  Psychiatric:        Mood and Affect: Mood normal.        Behavior: Behavior normal.     BP (!) 136/52   Pulse 69   Temp 97.9 F (36.6 C)   Resp 20   Ht 5\' 2"  (1.575 m)   Wt 163 lb (73.9 kg)   SpO2 97%   BMI  29.81 kg/m  Wt Readings from Last 3 Encounters:  08/14/19 163 lb (73.9 kg)  07/03/19 161 lb (73 kg)  05/10/19 164 lb 6.4 oz (74.6 kg)    Health Maintenance Due  Topic Date Due  . COVID-19 Vaccine (1) Never done  . DEXA SCAN   12/11/2018    There are no preventive care reminders to display for this patient.   Lab Results  Component Value Date   TSH 2.570 05/08/2019   Lab Results  Component Value Date   WBC 7.1 10/21/2017   HGB 11.2 (L) 10/21/2017   HCT 35.2 (L) 10/21/2017   MCV 95.7 10/21/2017   PLT 121 (L) 10/21/2017   Lab Results  Component Value Date   NA 140 06/26/2019   K 4.6 06/26/2019   CO2 28 06/26/2019   GLUCOSE 104 (H) 06/26/2019   BUN 13 06/26/2019   CREATININE 0.82 06/26/2019   BILITOT 0.7 10/15/2017   ALKPHOS 56 10/15/2017   AST 20 10/15/2017   ALT 18 10/15/2017   PROT 6.7 10/15/2017   ALBUMIN 3.7 10/15/2017   CALCIUM 9.7 06/26/2019   ANIONGAP 11 10/21/2017   Lab Results  Component Value Date   CHOL 145 05/25/2017   Lab Results  Component Value Date   HDL 47 05/25/2017   Lab Results  Component Value Date   LDLCALC 71 05/25/2017   Lab Results  Component Value Date   TRIG 136 05/25/2017   Lab Results  Component Value Date   CHOLHDL 3.1 05/25/2017   Lab Results  Component Value Date   HGBA1C 5.7 (H) 10/15/2017       Assessment & Plan:   Problem List Items Addressed This Visit      Other   Localized edema - Primary    Bilateral +3 pitting edema, left foot greater than right. Edema is sever and causing patient pain on entire leg. Lasix 20 mg started once a day, provided extensive education, care and prevention. Patient to keep feet elevated above the heart. And follow up in 2 weeks.       Relevant Medications   furosemide (LASIX) 20 MG tablet       Meds ordered this encounter  Medications  . furosemide (LASIX) 20 MG tablet    Sig: Take 1 tablet (20 mg total) by mouth daily.    Dispense:  30 tablet    Refill:  0    Order Specific Question:   Supervising Provider    Answer:   Caryl Pina A [id number]     Ivy Lynn, NP

## 2019-08-14 NOTE — Patient Instructions (Addendum)
Edema  Edema is when you have too much fluid in your body or under your skin. Edema may make your legs, feet, and ankles swell up. Swelling is also common in looser tissues, like around your eyes. This is a common condition. It gets more common as you get older. There are many possible causes of edema. Eating too much salt (sodium) and being on your feet or sitting for a long time can cause edema in your legs, feet, and ankles. Hot weather may make edema worse. Edema is usually painless. Your skin may look swollen or shiny. Follow these instructions at home:  Keep the swollen body part raised (elevated) above the level of your heart when you are sitting or lying down.  Do not sit still or stand for a long time.  Do not wear tight clothes. Do not wear garters on your upper legs.  Exercise your legs. This can help the swelling go down.  Wear elastic bandages or support stockings as told by your doctor.  Eat a low-salt (low-sodium) diet to reduce fluid as told by your doctor.  Depending on the cause of your swelling, you may need to limit how much fluid you drink (fluid restriction).  Take over-the-counter and prescription medicines only as told by your doctor. Contact a doctor if:  Treatment is not working.  You have heart, liver, or kidney disease and have symptoms of edema.  You have sudden and unexplained weight gain. Get help right away if:  You have shortness of breath or chest pain.  You cannot breathe when you lie down.  You have pain, redness, or warmth in the swollen areas.  You have heart, liver, or kidney disease and get edema all of a sudden.  You have a fever and your symptoms get worse all of a sudden. Summary  Edema is when you have too much fluid in your body or under your skin.  Edema may make your legs, feet, and ankles swell up. Swelling is also common in looser tissues, like around your eyes.  Raise (elevate) the swollen body part above the level of your  heart when you are sitting or lying down.  Follow your doctor's instructions about diet and how much fluid you can drink (fluid restriction). This information is not intended to replace advice given to you by your health care provider. Make sure you discuss any questions you have with your health care provider. Document Revised: 04/02/2017 Document Reviewed: 04/17/2016 Elsevier Patient Education  2020 Elsevier Inc.  

## 2019-08-14 NOTE — Assessment & Plan Note (Signed)
Bilateral +3 pitting edema, left foot greater than right. Edema is sever and causing patient pain on entire leg. Lasix 20 mg started once a day, provided extensive education, care and prevention. Patient to keep feet elevated above the heart. And follow up in 2 weeks.

## 2019-08-28 ENCOUNTER — Encounter: Payer: Self-pay | Admitting: Nurse Practitioner

## 2019-08-28 ENCOUNTER — Ambulatory Visit (INDEPENDENT_AMBULATORY_CARE_PROVIDER_SITE_OTHER): Payer: Medicare Other | Admitting: Nurse Practitioner

## 2019-08-28 ENCOUNTER — Other Ambulatory Visit: Payer: Self-pay

## 2019-08-28 VITALS — BP 155/60 | HR 96 | Temp 95.0°F | Ht 62.0 in | Wt 162.4 lb

## 2019-08-28 DIAGNOSIS — R600 Localized edema: Secondary | ICD-10-CM

## 2019-08-28 MED ORDER — ROSUVASTATIN CALCIUM 5 MG PO TABS: 5.0000 mg | ORAL_TABLET | Freq: Every day | ORAL | 1 refills | Status: DC

## 2019-08-28 NOTE — Assessment & Plan Note (Addendum)
Bilateral lower extremity edema is improving, no changes to patients current medication dose. Lasix 20 mg daily. Provided education, low sodium diet. Exercise, and f/u as needed

## 2019-08-28 NOTE — Progress Notes (Signed)
Established Patient Office Visit  Subjective:  Patient ID: [name], female    DOB: September 11, 1935  Age: 84 y.o. MRN: [id number]  CC:  Chief Complaint  Patient presents with  . Edema    2 week follow up    HPI [name] presents for follow up bilateral edema. Patient started lasix  20 mg daily in the  last 2 weeks and reports swelling and tightness in her feet has improved. She is able to wear regular shoes without  discomfort, ankles are moderately swollen. No current side effects form medication. She is compliant with her medication and is keeping up with her appointments.  Past Medical History:  Diagnosis Date  . Chronic headaches   . Fibromyalgia   . GERD (gastroesophageal reflux disease)   . Hemorrhoids    INTERNAL--  POST BANDING 02/ 2015  . History of kidney stones   . Hyperlipidemia   . Hypertension   . Moderate aortic stenosis   . OSA (obstructive sleep apnea) CPAP INTOLERANT   . Osteoporosis   . S/P TAVR (transcatheter aortic valve replacement) 10/19/2017   20 mm Edwards Sapien 3 transcatheter heart valve placed via percutaneous right transfemoral approach   . Severe aortic stenosis   . Spinal stenosis, multilevel     Past Surgical History:  Procedure Laterality Date  . ANTERIOR CERVICAL DECOMP/DISCECTOMY FUSION  11-11-1999   C5 - C6  . APPENDECTOMY  AGE 48  . CARDIAC CATHETERIZATION  2008  DR [facility]   ESSENTIALLY NORMAL  . CATARACT EXTRACTION Bilateral   . CATARACT EXTRACTION W/ INTRAOCULAR LENS  IMPLANT, BILATERAL  2012  . EYE SURGERY    . FLEXIBLE SIGMOIDOSCOPY N/A 06/01/2013   Procedure: FLEXIBLE SIGMOIDOSCOPY;  Surgeon: Inda Castle, MD;  Location: [facility];  Service: Endoscopy;  Laterality: N/A;  may need hemorrhoidal banding  . HEMORRHOIDECTOMY WITH HEMORRHOID BANDING  08-07-2010  . IR RADIOLOGY PERIPHERAL GUIDED IV START  06/27/2019  . IR US GUIDE VASC ACCESS RIGHT  06/27/2019  . LAPAROSCOPIC CHOLECYSTECTOMY  1990  . RIGHT/LEFT HEART CATH AND  CORONARY ANGIOGRAPHY N/A 09/29/2017   Procedure: RIGHT/LEFT HEART CATH AND CORONARY ANGIOGRAPHY;  Surgeon: Sherren Mocha, MD;  Location: [place] [facility];  Service: Cardiovascular;  Laterality: N/A;  . SHOULDER ARTHROSCOPY WITH OPEN ROTATOR CUFF REPAIR AND DISTAL CLAVICLE ACROMINECTOMY Right 05/25/2012   Procedure: SHOULDER ARTHROSCOPY WITH OPEN ROTATOR CUFF REPAIR AND DISTAL CLAVICLE ACROMINECTOMY;  Surgeon: Magnus Sinning, MD;  Location: [place];  Service: Orthopedics;  Laterality: Right;  RIGHT SHOULDER ARTHROSCOPY WITH DERBRIDEMENTOF LABRAL/BICEP TENDON, OPEN DISTAL CLAVICLE RESECTION, ANTERIOR ACROMINECTOMY ROTATOR CUFF REPAIR ANESTHESIA: GENERAL/SCALENE NERVE BLOCK  . TEE WITHOUT CARDIOVERSION Bilateral 10/19/2017   Procedure: TRANSESOPHAGEAL ECHOCARDIOGRAM (TEE);  Surgeon: Sherren Mocha, MD;  Location: [place];  Service: Open Heart Surgery;  Laterality: Bilateral;  . TENOSYNOVECTOMY Left 01/04/2014   Procedure: LEFT WRIST EXTENSOR TENOSYNOVECTOMY;  Surgeon: Linna Hoff, MD;  Location: [facility];  Service: Orthopedics;  Laterality: Left;  . THYROIDECTOMY  AGE 9   GOITER  . TONSILLECTOMY AND ADENOIDECTOMY  AGE 59  . TRANSCATHETER AORTIC VALVE REPLACEMENT, TRANSFEMORAL  10/19/2017  . TRANSCATHETER AORTIC VALVE REPLACEMENT, TRANSFEMORAL Bilateral 10/19/2017   Procedure: TRANSCATHETER AORTIC VALVE REPLACEMENT, TRANSFEMORAL;  Surgeon: Sherren Mocha, MD;  Location: [place];  Service: Open Heart Surgery;  Laterality: Bilateral;  . TRANSTHORACIC ECHOCARDIOGRAM  last one 04-20-2013  DR [facility]    NORMAL LVSF/ EF 60-65%/ MODERATE  AV  STENOSIS  WITH NO AR /  MILD LAE  . UMBILICAL HERNIA REPAIR  JUNE 2006  . VAGINAL HYSTERECTOMY  01-31-2001   ANTERIOR & POSTERIOR REPAIR/ TRANSVAGINAL BLADDER SLING    Family History  Problem Relation Age of Onset  . Cirrhosis Mother        drinking  . Other Brother        duodenal ulcer  . Heart disease Sister   . Cancer  Son 93       colon  . Gallbladder disease Maternal Grandmother   . Stomach cancer Paternal Grandfather     Social History   Socioeconomic History  . Marital status: Married    Spouse name: Shanon Brow  . Number of children: 6  . Years of education: 6  . Highest education level: 6th grade  Occupational History  . Occupation: Retired  Tobacco Use  . Smoking status: Former Smoker    Packs/day: 0.25    Years: 5.00    Pack years: 1.25    Types: Cigarettes    Quit date: 04/14/2003    Years since quitting: 16.3  . Smokeless tobacco: Never Used  Substance and Sexual Activity  . Alcohol use: Yes    Comment: once in a while-social  . Drug use: Never    Comment: hemp oil   . Sexual activity: Not Currently  Other Topics Concern  . Not on file  Social History Narrative  . Not on file   Social Determinants of Health   Financial Resource Strain: Low Risk   . Difficulty of Paying Living Expenses: Not hard at all  Food Insecurity: No Food Insecurity  . Worried About Charity fundraiser in the Last Year: Never true  . Ran Out of Food in the Last Year: Never true  Transportation Needs: No Transportation Needs  . Lack of Transportation (Medical): No  . Lack of Transportation (Non-Medical): No  Physical Activity: Inactive  . Days of Exercise per Week: 0 days  . Minutes of Exercise per Session: 0 min  Stress: No Stress Concern Present  . Feeling of Stress : Not at all  Social Connections: Not Isolated  . Frequency of Communication with Friends and Family: More than three times a week  . Frequency of Social Gatherings with Friends and Family: Once a week  . Attends Religious Services: More than 4 times per year  . Active Member of Clubs or Organizations: Yes  . Attends Archivist Meetings: More than 4 times per year  . Marital Status: Married  Human resources officer Violence: Not At Risk  . Fear of Current or Ex-Partner: No  . Emotionally Abused: No  . Physically Abused: No  .  Sexually Abused: No    Outpatient Medications Prior to Visit  Medication Sig Dispense Refill  . acetaminophen (TYLENOL) 500 MG tablet Take 1,000 mg by mouth daily as needed for moderate pain or headache.    . ALOE VERA PO Take 1 capsule by mouth daily.    Marland Kitchen amLODipine (NORVASC) 10 MG tablet Take 1 tablet (10 mg total) by mouth daily. 90 tablet 3  . Ascorbic Acid (VITAMIN C) 1000 MG tablet Take 1,000 mg by mouth daily.    Marland Kitchen aspirin EC 81 MG tablet Take 1 tablet (81 mg total) by mouth daily. 90 tablet 3  . BEE POLLEN PO Take 400 mg by mouth daily.     . clobetasol cream (TEMOVATE) [id number] % APPLY 1 APPLICATION TOPICALLY 2 (TWO) TIMES DAILY AS NEEDED. 30 g  3  . Coenzyme Q10 (COQ-10) 100 MG CAPS Take 100 mg by mouth daily.    . furosemide (LASIX) 20 MG tablet Take 1 tablet (20 mg total) by mouth daily. 30 tablet 0  . meclizine (ANTIVERT) 25 MG tablet Take 0.5-1 tablets (12.5-25 mg total) by mouth 3 (three) times daily as needed for dizziness. 30 tablet 0  . metoprolol tartrate (LOPRESSOR) 50 MG tablet TAKE 1 TABLET BY MOUTH TWICE A DAY 180 tablet 1  . mupirocin ointment (BACTROBAN) 2 % Apply to the affected area twice daily for 7-10 days 22 g 0  . OVER THE COUNTER MEDICATION Take 1 capsule by mouth 2 (two) times daily. Hemp Oil    . prednisoLONE acetate (PRED FORTE) 1 % ophthalmic suspension Place 1 drop into the right eye 4 (four) times daily. (Patient taking differently: Place 1 drop into the right eye 2 (two) times a day. ) 10 mL 0  . UNABLE TO FIND Med Name: HEMP- Tumeric cream / RUB    . rosuvastatin (CRESTOR) 5 MG tablet Take 1 tablet (5 mg total) by mouth daily. (Needs to be seen before next refill) 30 tablet 0   No facility-administered medications prior to visit.    Allergies  Allergen Reactions  . Tape Other (See Comments)    Dermatitis rash "with extended exposure"  . Prolia [Denosumab] Other (See Comments)    Arthralgia/ myalgia/ jaw pain/ headache    ROS Review of Systems    Constitutional: Negative for activity change, appetite change and fatigue.  Eyes: Negative.   Respiratory: Negative for cough, chest tightness and shortness of breath.   Cardiovascular: Positive for leg swelling. Negative for chest pain and palpitations.  Endocrine: Negative.   Genitourinary: Negative for difficulty urinating.  Musculoskeletal: Positive for joint swelling and myalgias. Negative for arthralgias.  Neurological: Negative for syncope.  Psychiatric/Behavioral: The patient is not nervous/anxious.       Objective:    Physical Exam  Constitutional: She is oriented to person, place, and time. She appears well-developed.  HENT:  Head: Normocephalic.  Eyes: Conjunctivae are normal.  Cardiovascular: Normal rate and regular rhythm.  Pulmonary/Chest: Breath sounds normal.  Abdominal: Bowel sounds are normal.  Musculoskeletal:        General: Tenderness and edema present.     Cervical back: Normal range of motion.  Neurological: She is alert and oriented to person, place, and time.  Skin: Skin is warm. No erythema.  Psychiatric: She has a normal mood and affect. Her behavior is normal.    BP (!) 155/60   Pulse 96   Temp (!) 95 F (35 C) (Temporal)   Ht 5\' 2"  (1.575 m)   Wt 162 lb 6.4 oz (73.7 kg)   SpO2 95%   BMI 29.70 kg/m  Wt Readings from Last 3 Encounters:  08/28/19 162 lb 6.4 oz (73.7 kg)  08/14/19 163 lb (73.9 kg)  07/03/19 161 lb (73 kg)     Health Maintenance Due  Topic Date Due  . COVID-19 Vaccine (1) Never done  . DEXA SCAN  12/11/2018    There are no preventive care reminders to display for this patient.  Lab Results  Component Value Date   TSH 2.570 05/08/2019   Lab Results  Component Value Date   WBC 7.1 10/21/2017   HGB 11.2 (L) 10/21/2017   HCT 35.2 (L) 10/21/2017   MCV 95.7 10/21/2017   PLT 121 (L) 10/21/2017   Lab Results  Component Value Date  NA 140 06/26/2019   K 4.6 06/26/2019   CO2 28 06/26/2019   GLUCOSE 104 (H)  06/26/2019   BUN 13 06/26/2019   CREATININE 0.82 06/26/2019   BILITOT 0.7 10/15/2017   ALKPHOS 56 10/15/2017   AST 20 10/15/2017   ALT 18 10/15/2017   PROT 6.7 10/15/2017   ALBUMIN 3.7 10/15/2017   CALCIUM 9.7 06/26/2019   ANIONGAP 11 10/21/2017   Lab Results  Component Value Date   CHOL 145 05/25/2017   Lab Results  Component Value Date   HDL 47 05/25/2017   Lab Results  Component Value Date   LDLCALC 71 05/25/2017   Lab Results  Component Value Date   TRIG 136 05/25/2017   Lab Results  Component Value Date   CHOLHDL 3.1 05/25/2017   Lab Results  Component Value Date   HGBA1C 5.7 (H) 10/15/2017      Assessment & Plan:   Problem List Items Addressed This Visit    None      Meds ordered this encounter  Medications  . rosuvastatin (CRESTOR) 5 MG tablet    Sig: Take 1 tablet (5 mg total) by mouth daily.    Dispense:  90 tablet    Refill:  1    Follow-up: No follow-ups on file.    Ivy Lynn, NP

## 2019-08-28 NOTE — Patient Instructions (Addendum)
Bilateral lower extremity edema is improving, no changes to patients current medication dose. Lasix 20 mg daily. Provided education, low sodium diet. Exercise, and f/u as needed   Edema  Edema is when you have too much fluid in your body or under your skin. Edema may make your legs, feet, and ankles swell up. Swelling is also common in looser tissues, like around your eyes. This is a common condition. It gets more common as you get older. There are many possible causes of edema. Eating too much salt (sodium) and being on your feet or sitting for a long time can cause edema in your legs, feet, and ankles. Hot weather may make edema worse. Edema is usually painless. Your skin may look swollen or shiny. Follow these instructions at home:  Keep the swollen body part raised (elevated) above the level of your heart when you are sitting or lying down.  Do not sit still or stand for a long time.  Do not wear tight clothes. Do not wear garters on your upper legs.  Exercise your legs. This can help the swelling go down.  Wear elastic bandages or support stockings as told by your doctor.  Eat a low-salt (low-sodium) diet to reduce fluid as told by your doctor.  Depending on the cause of your swelling, you may need to limit how much fluid you drink (fluid restriction).  Take over-the-counter and prescription medicines only as told by your doctor. Contact a doctor if:  Treatment is not working.  You have heart, liver, or kidney disease and have symptoms of edema.  You have sudden and unexplained weight gain. Get help right away if:  You have shortness of breath or chest pain.  You cannot breathe when you lie down.  You have pain, redness, or warmth in the swollen areas.  You have heart, liver, or kidney disease and get edema all of a sudden.  You have a fever and your symptoms get worse all of a sudden. Summary  Edema is when you have too much fluid in your body or under your  skin.  Edema may make your legs, feet, and ankles swell up. Swelling is also common in looser tissues, like around your eyes.  Raise (elevate) the swollen body part above the level of your heart when you are sitting or lying down.  Follow your doctor's instructions about diet and how much fluid you can drink (fluid restriction). This information is not intended to replace advice given to you by your health care provider. Make sure you discuss any questions you have with your health care provider. Document Revised: 04/02/2017 Document Reviewed: 04/17/2016 Elsevier Patient Education  2020 [employer].

## 2019-09-05 ENCOUNTER — Ambulatory Visit (INDEPENDENT_AMBULATORY_CARE_PROVIDER_SITE_OTHER): Payer: Medicare Other | Admitting: *Deleted

## 2019-09-05 ENCOUNTER — Telehealth: Payer: Self-pay | Admitting: *Deleted

## 2019-09-05 DIAGNOSIS — I10 Essential (primary) hypertension: Secondary | ICD-10-CM | POA: Diagnosis not present

## 2019-09-05 DIAGNOSIS — R600 Localized edema: Secondary | ICD-10-CM

## 2019-09-05 NOTE — Telephone Encounter (Signed)
Last OV note said it was improving with Lasix?  Have her increase dose to 40mg  daily.  I'd like her to be seen in office sometime this week for repeat BMP and evaluation of edema.  May needs to follow up with cardiology as well

## 2019-09-05 NOTE — Chronic Care Management (AMB) (Signed)
Chronic Care Management   Follow Up Note   09/05/2019 Name: [name] MRN: [id number] DOB: 1935/08/20  Referred by: Megan Norlander, DO Reason for referral : Chronic Care Management (RN follow up)   [name] is a 84 y.o. year old female who is a primary care patient of Megan Norlander, DO. The CCM team was consulted for assistance with chronic disease management and care coordination needs.    Review of patient status, including review of consultants reports, relevant laboratory and other test results, and collaboration with appropriate care team members and the patient's provider was performed as part of comprehensive patient evaluation and provision of chronic care management services.    I spoke with [name] by telephone today regarding management of her chronic medical conditions.   SDOH (Social Determinants of Health) assessments performed: No See Care Plan activities for detailed interventions related to [facility])     Outpatient Encounter Medications as of 09/05/2019  Medication Sig  . acetaminophen (TYLENOL) 500 MG tablet Take 1,000 mg by mouth daily as needed for moderate pain or headache.  . ALOE VERA PO Take 1 capsule by mouth daily.  Marland Kitchen amLODipine (NORVASC) 10 MG tablet Take 1 tablet (10 mg total) by mouth daily.  . Ascorbic Acid (VITAMIN C) 1000 MG tablet Take 1,000 mg by mouth daily.  Marland Kitchen aspirin EC 81 MG tablet Take 1 tablet (81 mg total) by mouth daily.  Marland Kitchen BEE POLLEN PO Take 400 mg by mouth daily.   . clobetasol cream (TEMOVATE) [id number] % APPLY 1 APPLICATION TOPICALLY 2 (TWO) TIMES DAILY AS NEEDED.  Marland Kitchen Coenzyme Q10 (COQ-10) 100 MG CAPS Take 100 mg by mouth daily.  . furosemide (LASIX) 20 MG tablet Take 1 tablet (20 mg total) by mouth daily.  . meclizine (ANTIVERT) 25 MG tablet Take 0.5-1 tablets (12.5-25 mg total) by mouth 3 (three) times daily as needed for dizziness.  . metoprolol tartrate (LOPRESSOR) 50 MG tablet TAKE 1 TABLET BY MOUTH TWICE A DAY  . mupirocin  ointment (BACTROBAN) 2 % Apply to the affected area twice daily for 7-10 days  . OVER THE COUNTER MEDICATION Take 1 capsule by mouth 2 (two) times daily. Hemp Oil  . prednisoLONE acetate (PRED FORTE) 1 % ophthalmic suspension Place 1 drop into the right eye 4 (four) times daily. (Patient taking differently: Place 1 drop into the right eye 2 (two) times a day. )  . rosuvastatin (CRESTOR) 5 MG tablet Take 1 tablet (5 mg total) by mouth daily.  Marland Kitchen UNABLE TO FIND Med Name: HEMP- Tumeric cream / RUB   No facility-administered encounter medications on file as of 09/05/2019.    RN Care Plan   . "I want the swelling in my legs to get better"       CARE PLAN ENTRY (see longitudinal plan of care for additional care plan information)  Current Barriers:  . Care Coordination needs related to lower extremity edema in a patient with hypertension (disease states)  Nurse Case Manager Clinical Goal(s):  Marland Kitchen Over the next 3 days, patient will work with PCP regarding lower extremity edema . Over the next 15 days, patient will talk with RN Care Manager regarding lower extremity edema  Interventions:  . Inter-disciplinary care team collaboration (see longitudinal plan of care) . Chart reviewed including recent office notes and lab results . Talked with patient by telephone . Discussed bilateral lower extremity edema with L > R o Worse after prolonged standing o Improves some  with rest and elevation but does not resolve . Reviewed medications and discussed furosemide 20mg  o Taking daily as directed o She does not feel that she is urinating more than normal . Sent message to PCP regarding continued swelling and requesting recommendation for management  Patient Self Care Activities:  . Performs ADL's independently . Performs IADL's independently  Initial goal documentation         Plan:   The care management team will reach out to the patient again over the next 15 days.   [name], BSN, RN-BC  Embedded Chronic Care Manager [facility] [place] [facility] Direct Dial: [phone]

## 2019-09-05 NOTE — Telephone Encounter (Signed)
09/05/2019  I spoke with Ms Temple by telephone today regarding CCM. She reports that she is still experiencing swelling in both of her lower legs with the left greater than the right. Swelling is worse after standing and she is propping her feet up when sitting/lying. She is taking lasix 20mg  as directed but doesn't feel that she is urinating any more than usual.   Forwarding to PCP for review and recommendation.   Chong Sicilian, BSN, RN-BC Embedded Chronic Care Manager [facility] [place] [facility] Direct Dial: [phone]

## 2019-09-05 NOTE — Patient Instructions (Signed)
Visit Information  Goals Addressed            This Visit's Progress   . "I want the swelling in my legs to get better"       CARE PLAN ENTRY (see longitudinal plan of care for additional care plan information)  Current Barriers:  . Care Coordination needs related to lower extremity edema in a patient with hypertension (disease states)  Nurse Case Manager Clinical Goal(s):  Marland Kitchen Over the next 3 days, patient will work with PCP regarding lower extremity edema . Over the next 15 days, patient will talk with RN Care Manager regarding lower extremity edema  Interventions:  . Inter-disciplinary care team collaboration (see longitudinal plan of care) . Chart reviewed including recent office notes and lab results . Talked with patient by telephone . Discussed bilateral lower extremity edema with L > R o Worse after prolonged standing o Improves some with rest and elevation but does not resolve . Reviewed medications and discussed furosemide 20mg  o Taking daily as directed o She does not feel that she is urinating more than normal . Sent message to PCP regarding continued swelling and requesting recommendation for management  Patient Self Care Activities:  . Performs ADL's independently . Performs IADL's independently  Initial goal documentation        The patient verbalized understanding of instructions provided today and declined a print copy of patient instruction materials.   Follow-up Plan The care management team will reach out to the patient again over the next 15 days.   Chong Sicilian, BSN, RN-BC Embedded Chronic Care Manager [facility] [place] [facility] Direct Dial: [phone]

## 2019-09-06 NOTE — Telephone Encounter (Signed)
Patient notified to increase meds and appt made for recheck

## 2019-09-07 ENCOUNTER — Encounter: Payer: Self-pay | Admitting: Family Medicine

## 2019-09-07 ENCOUNTER — Ambulatory Visit (INDEPENDENT_AMBULATORY_CARE_PROVIDER_SITE_OTHER): Payer: Medicare Other | Admitting: Family Medicine

## 2019-09-07 ENCOUNTER — Other Ambulatory Visit: Payer: Self-pay

## 2019-09-07 VITALS — BP 138/44 | HR 58 | Temp 97.9°F | Ht 62.0 in | Wt 159.2 lb

## 2019-09-07 DIAGNOSIS — R600 Localized edema: Secondary | ICD-10-CM | POA: Diagnosis not present

## 2019-09-07 DIAGNOSIS — E162 Hypoglycemia, unspecified: Secondary | ICD-10-CM

## 2019-09-07 LAB — GLUCOSE HEMOCUE WAIVED: Glu Hemocue Waived: 104 mg/dL — ABNORMAL HIGH (ref 65–99)

## 2019-09-07 NOTE — Patient Instructions (Signed)
You had labs performed today.  You will be contacted with the results of the labs once they are available, usually in the next 3 business days for routine lab work.  If you have an active my chart account, they will be released to your MyChart.  If you prefer to have these labs released to you via telephone, please let us know.  If you had a pap smear or biopsy performed, expect to be contacted in about 7-10 days.  Use the Furosemide 40mg  for the next 2 days then stop and only use AS needed for swelling.  Use the compression hose to help with swelling.  Keep your legs up.  Edema  Edema is an abnormal buildup of fluids in the body tissues and under the skin. Swelling of the legs, feet, and ankles is a common symptom that becomes more likely as you get older. Swelling is also common in looser tissues, like around the eyes. When the affected area is squeezed, the fluid may move out of that spot and leave a dent for a few moments. This dent is called pitting edema. There are many possible causes of edema. Eating too much salt (sodium) and being on your feet or sitting for a long time can cause edema in your legs, feet, and ankles. Hot weather may make edema worse. Common causes of edema include:  Heart failure.  Liver or kidney disease.  Weak leg blood vessels.  Cancer.  An injury.  Pregnancy.  Medicines.  Being obese.  Low protein levels in the blood. Edema is usually painless. Your skin may look swollen or shiny. Follow these instructions at home:  Keep the affected body part raised (elevated) above the level of your heart when you are sitting or lying down.  Do not sit still or stand for long periods of time.  Do not wear tight clothing. Do not wear garters on your upper legs.  Exercise your legs to get your circulation going. This helps to move the fluid back into your blood vessels, and it may help the swelling go down.  Wear elastic bandages or support stockings to reduce  swelling as told by your health care provider.  Eat a low-salt (low-sodium) diet to reduce fluid as told by your health care provider.  Depending on the cause of your swelling, you may need to limit how much fluid you drink (fluid restriction).  Take over-the-counter and prescription medicines only as told by your health care provider. Contact a health care provider if:  Your edema does not get better with treatment.  You have heart, liver, or kidney disease and have symptoms of edema.  You have sudden and unexplained weight gain. Get help right away if:  You develop shortness of breath or chest pain.  You cannot breathe when you lie down.  You develop pain, redness, or warmth in the swollen areas.  You have heart, liver, or kidney disease and suddenly get edema.  You have a fever and your symptoms suddenly get worse. Summary  Edema is an abnormal buildup of fluids in the body tissues and under the skin.  Eating too much salt (sodium) and being on your feet or sitting for a long time can cause edema in your legs, feet, and ankles.  Keep the affected body part raised (elevated) above the level of your heart when you are sitting or lying down. This information is not intended to replace advice given to you by your health care provider. Make sure you  discuss any questions you have with your health care provider. Document Revised: 08/17/2018 Document Reviewed: 05/02/2016 Elsevier Patient Education  [employer].

## 2019-09-07 NOTE — Progress Notes (Signed)
Subjective: CC: Lower extremity edema PCP: [name], DO HPI:[name] is a 84 y.o. female presenting to clinic today for:  1.  Lower extremity edema Patient was seen by a colleague 10 days ago for lower extremity edema.  She was placed on Lasix 20 mg daily but patient did not really feel that the edema was improving.  She contacted our office a couple of days ago and I instructed her to increase to 40 mg.  Almost instantaneously she lays she noticed increased urine production and improvement in the lower extremity edema.  She continues to have bilateral lower extremity edema but it is better than previous.  Left is more swollen than right.  She does note some cramping in the lower extremities but this is mild.  Denies any increased warmth, tenderness to palpation to the calves or discoloration.  No change in breathing or tolerance of physical activity.  No orthopnea.  No chest pain.   ROS: Per HPI  Allergies  Allergen Reactions  . Tape Other (See Comments)    Dermatitis rash "with extended exposure"  . Prolia [Denosumab] Other (See Comments)    Arthralgia/ myalgia/ jaw pain/ headache   Past Medical History:  Diagnosis Date  . Chronic headaches   . Fibromyalgia   . GERD (gastroesophageal reflux disease)   . Hemorrhoids    INTERNAL--  POST BANDING 02/ 2015  . History of kidney stones   . Hyperlipidemia   . Hypertension   . Moderate aortic stenosis   . OSA (obstructive sleep apnea) CPAP INTOLERANT   . Osteoporosis   . S/P TAVR (transcatheter aortic valve replacement) 10/19/2017   20 mm Edwards Sapien 3 transcatheter heart valve placed via percutaneous right transfemoral approach   . Severe aortic stenosis   . Spinal stenosis, multilevel     Current Outpatient Medications:  .  acetaminophen (TYLENOL) 500 MG tablet, Take 1,000 mg by mouth daily as needed for moderate pain or headache., Disp: , Rfl:  .  ALOE VERA PO, Take 1 capsule by mouth daily., Disp: , Rfl:  .   amLODipine (NORVASC) 10 MG tablet, Take 1 tablet (10 mg total) by mouth daily., Disp: 90 tablet, Rfl: 3 .  Ascorbic Acid (VITAMIN C) 1000 MG tablet, Take 1,000 mg by mouth daily., Disp: , Rfl:  .  aspirin EC 81 MG tablet, Take 1 tablet (81 mg total) by mouth daily., Disp: 90 tablet, Rfl: 3 .  BEE POLLEN PO, Take 400 mg by mouth daily. , Disp: , Rfl:  .  clobetasol cream (TEMOVATE) 2.19 %, APPLY 1 APPLICATION TOPICALLY 2 (TWO) TIMES DAILY AS NEEDED., Disp: 30 g, Rfl: 3 .  Coenzyme Q10 (COQ-10) 100 MG CAPS, Take 100 mg by mouth daily., Disp: , Rfl:  .  furosemide (LASIX) 20 MG tablet, Take 1 tablet (20 mg total) by mouth daily., Disp: 30 tablet, Rfl: 0 .  meclizine (ANTIVERT) 25 MG tablet, Take 0.5-1 tablets (12.5-25 mg total) by mouth 3 (three) times daily as needed for dizziness., Disp: 30 tablet, Rfl: 0 .  metoprolol tartrate (LOPRESSOR) 50 MG tablet, TAKE 1 TABLET BY MOUTH TWICE A DAY, Disp: 180 tablet, Rfl: 1 .  mupirocin ointment (BACTROBAN) 2 %, Apply to the affected area twice daily for 7-10 days, Disp: 22 g, Rfl: 0 .  OVER THE COUNTER MEDICATION, Take 1 capsule by mouth 2 (two) times daily. Hemp Oil, Disp: , Rfl:  .  prednisoLONE acetate (PRED FORTE) 1 % ophthalmic suspension, Place  1 drop into the right eye 4 (four) times daily. (Patient taking differently: Place 1 drop into the right eye 2 (two) times a day. ), Disp: 10 mL, Rfl: 0 .  rosuvastatin (CRESTOR) 5 MG tablet, Take 1 tablet (5 mg total) by mouth daily., Disp: 90 tablet, Rfl: 1 .  UNABLE TO FIND, Med Name: HEMP- Tumeric cream / RUB, Disp: , Rfl:  Social History   Socioeconomic History  . Marital status: Married    Spouse name: Shanon Brow  . Number of children: 6  . Years of education: 6  . Highest education level: 6th grade  Occupational History  . Occupation: Retired  Tobacco Use  . Smoking status: Former Smoker    Packs/day: 0.25    Years: 5.00    Pack years: 1.25    Types: Cigarettes    Quit date: 04/14/2003    Years since  quitting: 16.4  . Smokeless tobacco: Never Used  Substance and Sexual Activity  . Alcohol use: Yes    Comment: once in a while-social  . Drug use: Never    Comment: hemp oil   . Sexual activity: Not Currently  Other Topics Concern  . Not on file  Social History Narrative  . Not on file   Social Determinants of Health   Financial Resource Strain: Low Risk   . Difficulty of Paying Living Expenses: Not hard at all  Food Insecurity: No Food Insecurity  . Worried About Charity fundraiser in the Last Year: Never true  . Ran Out of Food in the Last Year: Never true  Transportation Needs: No Transportation Needs  . Lack of Transportation (Medical): No  . Lack of Transportation (Non-Medical): No  Physical Activity: Inactive  . Days of Exercise per Week: 0 days  . Minutes of Exercise per Session: 0 min  Stress: No Stress Concern Present  . Feeling of Stress : Not at all  Social Connections: Not Isolated  . Frequency of Communication with Friends and Family: More than three times a week  . Frequency of Social Gatherings with Friends and Family: Once a week  . Attends Religious Services: More than 4 times per year  . Active Member of Clubs or Organizations: Yes  . Attends Archivist Meetings: More than 4 times per year  . Marital Status: Married  Human resources officer Violence: Not At Risk  . Fear of Current or Ex-Partner: No  . Emotionally Abused: No  . Physically Abused: No  . Sexually Abused: No   Family History  Problem Relation Age of Onset  . Cirrhosis Mother        drinking  . Other Brother        duodenal ulcer  . Heart disease Sister   . Cancer Son 88       colon  . Gallbladder disease Maternal Grandmother   . Stomach cancer Paternal Grandfather     Objective: Office vital signs reviewed. BP (!) 138/44   Pulse (!) 58   Temp 97.9 F (36.6 C)   Ht 5' 2"  (1.575 m)   Wt 159 lb 3.2 oz (72.2 kg)   SpO2 97%   BMI 29.12 kg/m   Physical Examination:   General: Awake, alert, No acute distress HEENT: Normal; no JVD Cardio: regular rate and rhythm, Z6X0 heard, systolic murmur appreciated Pulm: clear to auscultation bilaterally, no wheezes, rhonchi or rales; normal work of breathing on room air Extremities: warm, well perfused, 1+ pitting edema on LLE, trace edema on  RLE, No cyanosis or clubbing; +2 pulses bilaterally.  Negative Homans.  No palpable cords.  No tenderness palpation to the calves. MSK: normal gait and station  Assessment/ Plan: 84 y.o. female   1. Bilateral lower extremity edema Uncertain etiology.  Last echocardiogram showed ejection fraction of greater than 60%.  Will evaluate for possible metabolic etiology.  Will at least follow-up on potassium and renal level given use of Lasix for the last several weeks.  Her diastolic blood pressure was slightly low today.  We will need to keep a close eye on that and for that reason I would like to see her back in 2 weeks.  For now, okay to continue the Lasix 40 mg daily for 2 additional days then use only as needed for lower extremity edema.  I encourage compression hose, elevation of lower extremities.  I am going to go ahead and check a D-dimer despite well score of 0 given significant size discrepancy of the left lower extremity compared to the right. - CMP14+EGFR - TSH - Brain natriuretic peptide - CBC - D-dimer, quantitative (not at [facility])  2. Hypoglycemia BG was 104 - Glucose Hemocue Waived   Orders Placed This Encounter  Procedures  . Glucose Hemocue Waived  . CMP14+EGFR  . TSH  . Brain natriuretic peptide  . CBC  . D-dimer, quantitative (not at [facility])   No orders of the defined types were placed in this encounter.    [name], DO [place] [phone]

## 2019-09-08 LAB — CMP14+EGFR
ALT: 16 IU/L (ref 0–32)
AST: 21 IU/L (ref 0–40)
Albumin/Globulin Ratio: 1.7 (ref 1.2–2.2)
Albumin: 4.5 g/dL (ref 3.6–4.6)
Alkaline Phosphatase: 63 IU/L (ref 48–121)
BUN/Creatinine Ratio: 18 (ref 12–28)
BUN: 16 mg/dL (ref 8–27)
Bilirubin Total: 0.4 mg/dL (ref 0.0–1.2)
CO2: 25 mmol/L (ref 20–29)
Calcium: 10 mg/dL (ref 8.7–10.3)
Chloride: 97 mmol/L (ref 96–106)
Creatinine, Ser: 0.88 mg/dL (ref 0.57–1.00)
GFR calc Af Amer: 71 mL/min/{1.73_m2} (ref >59)
GFR calc non Af Amer: 61 mL/min/{1.73_m2} (ref >59)
Globulin, Total: 2.6 g/dL (ref 1.5–4.5)
Glucose: 100 mg/dL — ABNORMAL HIGH (ref 65–99)
Potassium: 4.4 mmol/L (ref 3.5–5.2)
Sodium: 139 mmol/L (ref 134–144)
Total Protein: 7.1 g/dL (ref 6.0–8.5)

## 2019-09-08 LAB — CBC
Hematocrit: 41.1 % (ref 34.0–46.6)
Hemoglobin: 13.6 g/dL (ref 11.1–15.9)
MCH: 29.8 pg (ref 26.6–33.0)
MCHC: 33.1 g/dL (ref 31.5–35.7)
MCV: 90 fL (ref 79–97)
Platelets: 218 10*3/uL (ref 150–450)
RBC: 4.57 x10E6/uL (ref 3.77–5.28)
RDW: 12.4 % (ref 11.7–15.4)
WBC: 6.5 10*3/uL (ref 3.4–10.8)

## 2019-09-08 LAB — D-DIMER, QUANTITATIVE: D-DIMER: 0.63 mg/L FEU — ABNORMAL HIGH (ref 0.00–0.49)

## 2019-09-08 LAB — TSH: TSH: 2.73 u[IU]/mL (ref 0.450–4.500)

## 2019-09-08 LAB — BRAIN NATRIURETIC PEPTIDE: BNP: 41.6 pg/mL (ref 0.0–100.0)

## 2019-09-18 ENCOUNTER — Ambulatory Visit: Payer: Medicare Other | Admitting: Family Medicine

## 2019-09-19 ENCOUNTER — Ambulatory Visit (INDEPENDENT_AMBULATORY_CARE_PROVIDER_SITE_OTHER): Payer: Medicare Other | Admitting: Nurse Practitioner

## 2019-09-19 ENCOUNTER — Other Ambulatory Visit: Payer: Self-pay

## 2019-09-19 ENCOUNTER — Encounter: Payer: Self-pay | Admitting: Nurse Practitioner

## 2019-09-19 VITALS — BP 126/47 | HR 68 | Temp 98.1°F | Resp 20 | Ht 62.0 in | Wt 162.0 lb

## 2019-09-19 DIAGNOSIS — R600 Localized edema: Secondary | ICD-10-CM | POA: Diagnosis not present

## 2019-09-19 DIAGNOSIS — M5440 Lumbago with sciatica, unspecified side: Secondary | ICD-10-CM

## 2019-09-19 MED ORDER — FUROSEMIDE 20 MG PO TABS: 20.0000 mg | ORAL_TABLET | Freq: Every day | ORAL | 0 refills | Status: DC

## 2019-09-19 MED ORDER — METHYLPREDNISOLONE ACETATE 40 MG/ML IJ SUSP
40.0000 mg | Freq: Once | INTRAMUSCULAR | Status: AC
  Administered 2019-09-19: 40 mg via INTRAMUSCULAR

## 2019-09-19 NOTE — Assessment & Plan Note (Signed)
Patient is a 84 year old female presenting today with bilateral lower back pain with sciatica, this is new for patient in the last few weeks.  Prior to today patient has had back pain but not radiating pain down to her legs.  Educated patient to use Tylenol as she is unable to use anti-inflammatory i.e. ibuprofen or naproxen.  Patient provided education to use ice, rest her joint, proper postural, lifting, back strengthening exercises recommended.  Patient given 40 mg Depo-Medrol shot in office.  Patient knows to follow-up with worsening or unresolved symptoms.

## 2019-09-19 NOTE — Progress Notes (Signed)
Established Patient Office Visit  Subjective:  Patient ID: [name], female    DOB: Dec 21, 1935  Age: 84 y.o. MRN: [id number]  CC:  Chief Complaint  Patient presents with  . Edema    follow up     HPI [name] presents for  follow up bilateral lower extremity edema, this is not new for patient.  Patient is compliant with current medication.  Lasix 40 mg daily as needed.  Patient is compliant with current medication and has no side effects.  Patient is not reporting any shortness of breath or fatigue.  Skin is not erythematous or warm to touch, no signs or symptoms of infection.  Bilateral lower extremity edema is +2 with left leg greater than right.    Concerning lower back pain.  Patient is reporting new pain bilateral lower back, pain is a 7 out of 10, on a pain scale of 0-10.  Pain started about 2 weeks ago and is worse today.  Patient has used Tylenol 500 mg as needed for pain with mild resolution.  Patient reports pain is worse when she rises from a sitting position.  Nothing worsens or relieves symptoms at the moment.  No changes to patient's regular activity patient reports slower movements due to pain.  Past Medical History:  Diagnosis Date  . Chronic headaches   . Fibromyalgia   . GERD (gastroesophageal reflux disease)   . Hemorrhoids    INTERNAL--  POST BANDING 02/ 2015  . History of kidney stones   . Hyperlipidemia   . Hypertension   . Moderate aortic stenosis   . OSA (obstructive sleep apnea) CPAP INTOLERANT   . Osteoporosis   . S/P TAVR (transcatheter aortic valve replacement) 10/19/2017   20 mm Edwards Sapien 3 transcatheter heart valve placed via percutaneous right transfemoral approach   . Severe aortic stenosis   . Spinal stenosis, multilevel     Past Surgical History:  Procedure Laterality Date  . ANTERIOR CERVICAL DECOMP/DISCECTOMY FUSION  11-11-1999   C5 - C6  . APPENDECTOMY  AGE 66  . CARDIAC CATHETERIZATION  2008  DR [facility]   ESSENTIALLY NORMAL   . CATARACT EXTRACTION Bilateral   . CATARACT EXTRACTION W/ INTRAOCULAR LENS  IMPLANT, BILATERAL  2012  . EYE SURGERY    . FLEXIBLE SIGMOIDOSCOPY N/A 06/01/2013   Procedure: FLEXIBLE SIGMOIDOSCOPY;  Surgeon: Inda Castle, MD;  Location: [facility];  Service: Endoscopy;  Laterality: N/A;  may need hemorrhoidal banding  . HEMORRHOIDECTOMY WITH HEMORRHOID BANDING  08-07-2010  . IR RADIOLOGY PERIPHERAL GUIDED IV START  06/27/2019  . IR US GUIDE VASC ACCESS RIGHT  06/27/2019  . LAPAROSCOPIC CHOLECYSTECTOMY  1990  . RIGHT/LEFT HEART CATH AND CORONARY ANGIOGRAPHY N/A 09/29/2017   Procedure: RIGHT/LEFT HEART CATH AND CORONARY ANGIOGRAPHY;  Surgeon: Sherren Mocha, MD;  Location: [place] [facility];  Service: Cardiovascular;  Laterality: N/A;  . SHOULDER ARTHROSCOPY WITH OPEN ROTATOR CUFF REPAIR AND DISTAL CLAVICLE ACROMINECTOMY Right 05/25/2012   Procedure: SHOULDER ARTHROSCOPY WITH OPEN ROTATOR CUFF REPAIR AND DISTAL CLAVICLE ACROMINECTOMY;  Surgeon: Magnus Sinning, MD;  Location: [place];  Service: Orthopedics;  Laterality: Right;  RIGHT SHOULDER ARTHROSCOPY WITH DERBRIDEMENTOF LABRAL/BICEP TENDON, OPEN DISTAL CLAVICLE RESECTION, ANTERIOR ACROMINECTOMY ROTATOR CUFF REPAIR ANESTHESIA: GENERAL/SCALENE NERVE BLOCK  . TEE WITHOUT CARDIOVERSION Bilateral 10/19/2017   Procedure: TRANSESOPHAGEAL ECHOCARDIOGRAM (TEE);  Surgeon: Sherren Mocha, MD;  Location: [place];  Service: Open Heart Surgery;  Laterality: Bilateral;  . TENOSYNOVECTOMY Left 01/04/2014  Procedure: LEFT WRIST EXTENSOR TENOSYNOVECTOMY;  Surgeon: Linna Hoff, MD;  Location: [facility];  Service: Orthopedics;  Laterality: Left;  . THYROIDECTOMY  AGE 89   GOITER  . TONSILLECTOMY AND ADENOIDECTOMY  AGE 11  . TRANSCATHETER AORTIC VALVE REPLACEMENT, TRANSFEMORAL  10/19/2017  . TRANSCATHETER AORTIC VALVE REPLACEMENT, TRANSFEMORAL Bilateral 10/19/2017   Procedure: TRANSCATHETER AORTIC VALVE REPLACEMENT,  TRANSFEMORAL;  Surgeon: Sherren Mocha, MD;  Location: [place];  Service: Open Heart Surgery;  Laterality: Bilateral;  . TRANSTHORACIC ECHOCARDIOGRAM  last one 04-20-2013  DR [facility]    NORMAL LVSF/ EF 60-65%/ MODERATE  AV  STENOSIS WITH NO AR /  MILD LAE  . UMBILICAL HERNIA REPAIR  JUNE 2006  . VAGINAL HYSTERECTOMY  01-31-2001   ANTERIOR & POSTERIOR REPAIR/ TRANSVAGINAL BLADDER SLING    Family History  Problem Relation Age of Onset  . Cirrhosis Mother        drinking  . Other Brother        duodenal ulcer  . Heart disease Sister   . Cancer Son 15       colon  . Gallbladder disease Maternal Grandmother   . Stomach cancer Paternal Grandfather     Social History   Socioeconomic History  . Marital status: Married    Spouse name: Shanon Brow  . Number of children: 6  . Years of education: 6  . Highest education level: 6th grade  Occupational History  . Occupation: Retired  Tobacco Use  . Smoking status: Former Smoker    Packs/day: 0.25    Years: 5.00    Pack years: 1.25    Types: Cigarettes    Quit date: 04/14/2003    Years since quitting: 16.4  . Smokeless tobacco: Never Used  Substance and Sexual Activity  . Alcohol use: Yes    Comment: once in a while-social  . Drug use: Never    Comment: hemp oil   . Sexual activity: Not Currently  Other Topics Concern  . Not on file  Social History Narrative  . Not on file   Social Determinants of Health   Financial Resource Strain: Low Risk   . Difficulty of Paying Living Expenses: Not hard at all  Food Insecurity: No Food Insecurity  . Worried About Charity fundraiser in the Last Year: Never true  . Ran Out of Food in the Last Year: Never true  Transportation Needs: No Transportation Needs  . Lack of Transportation (Medical): No  . Lack of Transportation (Non-Medical): No  Physical Activity: Inactive  . Days of Exercise per Week: 0 days  . Minutes of Exercise per Session: 0 min  Stress: No Stress Concern Present  . Feeling  of Stress : Not at all  Social Connections: Not Isolated  . Frequency of Communication with Friends and Family: More than three times a week  . Frequency of Social Gatherings with Friends and Family: Once a week  . Attends Religious Services: More than 4 times per year  . Active Member of Clubs or Organizations: Yes  . Attends Archivist Meetings: More than 4 times per year  . Marital Status: Married  Human resources officer Violence: Not At Risk  . Fear of Current or Ex-Partner: No  . Emotionally Abused: No  . Physically Abused: No  . Sexually Abused: No    Outpatient Medications Prior to Visit  Medication Sig Dispense Refill  . acetaminophen (TYLENOL) 500 MG tablet Take 1,000 mg by mouth daily as needed for moderate  pain or headache.    . ALOE VERA PO Take 1 capsule by mouth daily.    Marland Kitchen amLODipine (NORVASC) 10 MG tablet Take 1 tablet (10 mg total) by mouth daily. 90 tablet 3  . Ascorbic Acid (VITAMIN C) 1000 MG tablet Take 1,000 mg by mouth daily.    Marland Kitchen aspirin EC 81 MG tablet Take 1 tablet (81 mg total) by mouth daily. 90 tablet 3  . BEE POLLEN PO Take 400 mg by mouth daily.     . clobetasol cream (TEMOVATE) 3.81 % APPLY 1 APPLICATION TOPICALLY 2 (TWO) TIMES DAILY AS NEEDED. 30 g 3  . Coenzyme Q10 (COQ-10) 100 MG CAPS Take 100 mg by mouth daily.    . furosemide (LASIX) 20 MG tablet Take 1 tablet (20 mg total) by mouth daily. 30 tablet 0  . meclizine (ANTIVERT) 25 MG tablet Take 0.5-1 tablets (12.5-25 mg total) by mouth 3 (three) times daily as needed for dizziness. 30 tablet 0  . metoprolol tartrate (LOPRESSOR) 50 MG tablet TAKE 1 TABLET BY MOUTH TWICE A DAY 180 tablet 1  . mupirocin ointment (BACTROBAN) 2 % Apply to the affected area twice daily for 7-10 days 22 g 0  . OVER THE COUNTER MEDICATION Take 1 capsule by mouth 2 (two) times daily. Hemp Oil    . prednisoLONE acetate (PRED FORTE) 1 % ophthalmic suspension Place 1 drop into the right eye 4 (four) times daily. (Patient  taking differently: Place 1 drop into the right eye 2 (two) times a day. ) 10 mL 0  . rosuvastatin (CRESTOR) 5 MG tablet Take 1 tablet (5 mg total) by mouth daily. 90 tablet 1  . UNABLE TO FIND Med Name: HEMP- Tumeric cream / RUB     No facility-administered medications prior to visit.    Allergies  Allergen Reactions  . Tape Other (See Comments)    Dermatitis rash "with extended exposure"  . Prolia [Denosumab] Other (See Comments)    Arthralgia/ myalgia/ jaw pain/ headache    ROS Review of Systems  Constitutional: Negative.   HENT: Negative.   Eyes: Negative.   Respiratory: Negative.   Cardiovascular: Positive for leg swelling.  Gastrointestinal: Negative.   Genitourinary: Negative.   Musculoskeletal: Negative.   Skin: Negative for color change and rash.  Neurological: Negative for light-headedness and headaches.  Psychiatric/Behavioral: The patient is not nervous/anxious.       Objective:    Physical Exam  Constitutional: She is oriented to person, place, and time. She appears well-developed and well-nourished.  HENT:  Head: Normocephalic.  Right Ear: External ear normal.  Mouth/Throat: Oropharynx is clear and moist.  Cardiovascular: Normal rate, regular rhythm and normal heart sounds.  Pulmonary/Chest: Breath sounds normal. No respiratory distress.  Abdominal: Bowel sounds are normal. She exhibits no distension.  Musculoskeletal:        General: Edema present.     Cervical back: Neck supple.  Neurological: She is alert and oriented to person, place, and time.  Skin: Skin is warm.  Psychiatric: She has a normal mood and affect.    There were no vitals taken for this visit. Wt Readings from Last 3 Encounters:  09/07/19 159 lb 3.2 oz (72.2 kg)  08/28/19 162 lb 6.4 oz (73.7 kg)  08/14/19 163 lb (73.9 kg)     Health Maintenance Due  Topic Date Due  . COVID-19 Vaccine (1) Never done  . DEXA SCAN  12/11/2018      Lab Results  Component Value Date  TSH  2.730 09/07/2019   Lab Results  Component Value Date   WBC 6.5 09/07/2019   HGB 13.6 09/07/2019   HCT 41.1 09/07/2019   MCV 90 09/07/2019   PLT 218 09/07/2019   Lab Results  Component Value Date   NA 139 09/07/2019   K 4.4 09/07/2019   CO2 25 09/07/2019   GLUCOSE 100 (H) 09/07/2019   BUN 16 09/07/2019   CREATININE 0.88 09/07/2019   BILITOT 0.4 09/07/2019   ALKPHOS 63 09/07/2019   AST 21 09/07/2019   ALT 16 09/07/2019   PROT 7.1 09/07/2019   ALBUMIN 4.5 09/07/2019   CALCIUM 10.0 09/07/2019   ANIONGAP 11 10/21/2017   Lab Results  Component Value Date   CHOL 145 05/25/2017   Lab Results  Component Value Date   HDL 47 05/25/2017   Lab Results  Component Value Date   LDLCALC 71 05/25/2017   Lab Results  Component Value Date   TRIG 136 05/25/2017   Lab Results  Component Value Date   CHOLHDL 3.1 05/25/2017   Lab Results  Component Value Date   HGBA1C 5.7 (H) 10/15/2017      Assessment & Plan:   Problem List Items Addressed This Visit      Nervous and Auditory   Bilateral low back pain with sciatica    Patient is a 84 year old female presenting today with bilateral lower back pain with sciatica, this is new for patient in the last few weeks.  Prior to today patient has had back pain but not radiating pain down to her legs.  Educated patient to use Tylenol as she is unable to use anti-inflammatory i.e. ibuprofen or naproxen.  Patient provided education to use ice, rest her joint, proper postural, lifting, back strengthening exercises recommended.  Patient given 40 mg Depo-Medrol shot in office.  Patient knows to follow-up with worsening or unresolved symptoms.        Other   Localized edema - Primary    Patient following up for bilateral lower extremity edema.  Edema is gradually getting better but not completely resolved.  On patient's last visit Lasix dose changed from 20 mg daily to 40 mg Lasix once a day as needed.  Patient is not currently wearing  compression socks because it is difficult to put on.  Provided education to patient, printed handouts given. advised patient to continue Lasix 40 mg daily as needed, elevate feet above heart. orders for compression socks with Velcro completed.  Refill Rx (Lasix) sent to pharmacy      Relevant Medications   furosemide (LASIX) 20 MG tablet   Other Relevant Orders   For home use only DME Other see comment       Follow-up: Return if symptoms worsen or fail to improve.    Ivy Lynn, NP

## 2019-09-19 NOTE — Assessment & Plan Note (Signed)
Patient following up for bilateral lower extremity edema.  Edema is gradually getting better but not completely resolved.  On patient's last visit Lasix dose changed from 20 mg daily to 40 mg Lasix once a day as needed.  Patient is not currently wearing compression socks because it is difficult to put on.  Provided education to patient, printed handouts given. advised patient to continue Lasix 40 mg daily as needed, elevate feet above heart. orders for compression socks with Velcro completed.  Refill Rx (Lasix) sent to pharmacy

## 2019-09-19 NOTE — Patient Instructions (Addendum)
Localized edema Patient following up for bilateral lower extremity edema.  Edema is gradually getting better but not completely resolved.  On patient's last visit Lasix dose changed from 20 mg daily to 40 mg Lasix once a day as needed.  Patient is not currently wearing compression socks because it is difficult to put on.  Provided education to patient, printed handouts given. advised patient to continue Lasix 40 mg daily as needed, elevate feet above heart. orders for compression socks with Velcro completed.  Refill Rx (Lasix) sent to pharmacy  Bilateral low back pain with sciatica Patient is a 84 year old female presenting today with bilateral lower back pain with sciatica, this is new for patient in the last few weeks.  Prior to today patient has had back pain but not radiating pain down to her legs.  Educated patient to use Tylenol as she is unable to use anti-inflammatory i.e. ibuprofen or naproxen.  Patient provided education to use ice, rest her joint, proper postural, lifting, back strengthening exercises recommended.  Patient given 40 mg Depo-Medrol shot in office.  Patient knows to follow-up with worsening or unresolved symptoms.    Edema  Edema is when you have too much fluid in your body or under your skin. Edema may make your legs, feet, and ankles swell up. Swelling is also common in looser tissues, like around your eyes. This is a common condition. It gets more common as you get older. There are many possible causes of edema. Eating too much salt (sodium) and being on your feet or sitting for a long time can cause edema in your legs, feet, and ankles. Hot weather may make edema worse. Edema is usually painless. Your skin may look swollen or shiny. Follow these instructions at home:  Keep the swollen body part raised (elevated) above the level of your heart when you are sitting or lying down.  Do not sit still or stand for a long time.  Do not wear tight clothes. Do not wear garters  on your upper legs.  Exercise your legs. This can help the swelling go down.  Wear elastic bandages or support stockings as told by your doctor.  Eat a low-salt (low-sodium) diet to reduce fluid as told by your doctor.  Depending on the cause of your swelling, you may need to limit how much fluid you drink (fluid restriction).  Take over-the-counter and prescription medicines only as told by your doctor. Contact a doctor if:  Treatment is not working.  You have heart, liver, or kidney disease and have symptoms of edema.  You have sudden and unexplained weight gain. Get help right away if:  You have shortness of breath or chest pain.  You cannot breathe when you lie down.  You have pain, redness, or warmth in the swollen areas.  You have heart, liver, or kidney disease and get edema all of a sudden.  You have a fever and your symptoms get worse all of a sudden. Summary  Edema is when you have too much fluid in your body or under your skin.  Edema may make your legs, feet, and ankles swell up. Swelling is also common in looser tissues, like around your eyes.  Raise (elevate) the swollen body part above the level of your heart when you are sitting or lying down.  Follow your doctor's instructions about diet and how much fluid you can drink (fluid restriction). This information is not intended to replace advice given to you by your health care provider.  Make sure you discuss any questions you have with your health care provider. Document Revised: 04/02/2017 Document Reviewed: 04/17/2016 Elsevier Patient Education  [place].  Acute Back Pain, Adult Acute back pain is sudden and usually short-lived. It is often caused by an injury to the muscles and tissues in the back. The injury may result from:  A muscle or ligament getting overstretched or torn (strained). Ligaments are tissues that connect bones to each other. Lifting something improperly can cause a back  strain.  Wear and tear (degeneration) of the spinal disks. Spinal disks are circular tissue that provides cushioning between the bones of the spine (vertebrae).  Twisting motions, such as while playing sports or doing yard work.  A hit to the back.  Arthritis. You may have a physical exam, lab tests, and imaging tests to find the cause of your pain. Acute back pain usually goes away with rest and home care. Follow these instructions at home: Managing pain, stiffness, and swelling  Take over-the-counter and prescription medicines only as told by your health care provider.  Your health care provider may recommend applying ice during the first 24-48 hours after your pain starts. To do this: ? Put ice in a plastic bag. ? Place a towel between your skin and the bag. ? Leave the ice on for 20 minutes, 2-3 times a day.  If directed, apply heat to the affected area as often as told by your health care provider. Use the heat source that your health care provider recommends, such as a moist heat pack or a heating pad. ? Place a towel between your skin and the heat source. ? Leave the heat on for 20-30 minutes. ? Remove the heat if your skin turns bright red. This is especially important if you are unable to feel pain, heat, or cold. You have a greater risk of getting burned. Activity   Do not stay in bed. Staying in bed for more than 1-2 days can delay your recovery.  Sit up and stand up straight. Avoid leaning forward when you sit, or hunching over when you stand. ? If you work at a desk, sit close to it so you do not need to lean over. Keep your chin tucked in. Keep your neck drawn back, and keep your elbows bent at a right angle. Your arms should look like the letter "L." ? Sit high and close to the steering wheel when you drive. Add lower back (lumbar) support to your car seat, if needed.  Take short walks on even surfaces as soon as you are able. Try to increase the length of time you  walk each day.  Do not sit, drive, or stand in one place for more than 30 minutes at a time. Sitting or standing for long periods of time can put stress on your back.  Do not drive or use heavy machinery while taking prescription pain medicine.  Use proper lifting techniques. When you bend and lift, use positions that put less stress on your back: ? [place] your knees. ? Keep the load close to your body. ? Avoid twisting.  Exercise regularly as told by your health care provider. Exercising helps your back heal faster and helps prevent back injuries by keeping muscles strong and flexible.  Work with a physical therapist to make a safe exercise program, as recommended by your health care provider. Do any exercises as told by your physical therapist. Lifestyle  Maintain a healthy weight. Extra weight puts stress  on your back and makes it difficult to have good posture.  Avoid activities or situations that make you feel anxious or stressed. Stress and anxiety increase muscle tension and can make back pain worse. Learn ways to manage anxiety and stress, such as through exercise. General instructions  Sleep on a firm mattress in a comfortable position. Try lying on your side with your knees slightly bent. If you lie on your back, put a pillow under your knees.  Follow your treatment plan as told by your health care provider. This may include: ? Cognitive or behavioral therapy. ? Acupuncture or massage therapy. ? Meditation or yoga. Contact a health care provider if:  You have pain that is not relieved with rest or medicine.  You have increasing pain going down into your legs or buttocks.  Your pain does not improve after 2 weeks.  You have pain at night.  You lose weight without trying.  You have a fever or chills. Get help right away if:  You develop new bowel or bladder control problems.  You have unusual weakness or numbness in your arms or legs.  You develop nausea or  vomiting.  You develop abdominal pain.  You feel faint. Summary  Acute back pain is sudden and usually short-lived.  Use proper lifting techniques. When you bend and lift, use positions that put less stress on your back.  Take over-the-counter and prescription medicines and apply heat or ice as directed by your health care provider. This information is not intended to replace advice given to you by your health care provider. Make sure you discuss any questions you have with your health care provider. Document Revised: 07/19/2018 Document Reviewed: 11/11/2016 Elsevier Patient Education  [place].  Back Exercises These exercises help to make your trunk and back strong. They also help to keep the lower back flexible. Doing these exercises can help to prevent back pain or lessen existing pain.  If you have back pain, try to do these exercises 2-3 times each day or as told by your doctor.  As you get better, do the exercises once each day. Repeat the exercises more often as told by your doctor.  To stop back pain from coming back, do the exercises once each day, or as told by your doctor. Exercises Single knee to chest Do these steps 3-5 times in a row for each leg: 1. Lie on your back on a firm bed or the floor with your legs stretched out. 2. Bring one knee to your chest. 3. Grab your knee or thigh with both hands and hold them it in place. 4. Pull on your knee until you feel a gentle stretch in your lower back or buttocks. 5. Keep doing the stretch for 10-30 seconds. 6. Slowly let go of your leg and straighten it. Pelvic tilt Do these steps 5-10 times in a row: 1. Lie on your back on a firm bed or the floor with your legs stretched out. 2. Bend your knees so they point up to the ceiling. Your feet should be flat on the floor. 3. Tighten your lower belly (abdomen) muscles to press your lower back against the floor. This will make your tailbone point up to the ceiling  instead of pointing down to your feet or the floor. 4. Stay in this position for 5-10 seconds while you gently tighten your muscles and breathe evenly. Cat-cow Do these steps until your lower back bends more easily: 1. Get on your hands  and knees on a firm surface. Keep your hands under your shoulders, and keep your knees under your hips. You may put padding under your knees. 2. Let your head hang down toward your chest. Tighten (contract) the muscles in your belly. Point your tailbone toward the floor so your lower back becomes rounded like the back of a cat. 3. Stay in this position for 5 seconds. 4. Slowly lift your head. Let the muscles of your belly relax. Point your tailbone up toward the ceiling so your back forms a sagging arch like the back of a cow. 5. Stay in this position for 5 seconds.  Press-ups Do these steps 5-10 times in a row: 1. Lie on your belly (face-down) on the floor. 2. Place your hands near your head, about shoulder-width apart. 3. While you keep your back relaxed and keep your hips on the floor, slowly straighten your arms to raise the top half of your body and lift your shoulders. Do not use your back muscles. You may change where you place your hands in order to make yourself more comfortable. 4. Stay in this position for 5 seconds. 5. Slowly return to lying flat on the floor.  Bridges Do these steps 10 times in a row: 1. Lie on your back on a firm surface. 2. Bend your knees so they point up to the ceiling. Your feet should be flat on the floor. Your arms should be flat at your sides, next to your body. 3. Tighten your butt muscles and lift your butt off the floor until your waist is almost as high as your knees. If you do not feel the muscles working in your butt and the back of your thighs, slide your feet 1-2 inches farther away from your butt. 4. Stay in this position for 3-5 seconds. 5. Slowly lower your butt to the floor, and let your butt muscles relax. If  this exercise is too easy, try doing it with your arms crossed over your chest. Belly crunches Do these steps 5-10 times in a row: 1. Lie on your back on a firm bed or the floor with your legs stretched out. 2. Bend your knees so they point up to the ceiling. Your feet should be flat on the floor. 3. Cross your arms over your chest. 4. Tip your chin a little bit toward your chest but do not bend your neck. 5. Tighten your belly muscles and slowly raise your chest just enough to lift your shoulder blades a tiny bit off of the floor. Avoid raising your body higher than that, because it can put too much stress on your low back. 6. Slowly lower your chest and your head to the floor. Back lifts Do these steps 5-10 times in a row: 1. Lie on your belly (face-down) with your arms at your sides, and rest your forehead on the floor. 2. Tighten the muscles in your legs and your butt. 3. Slowly lift your chest off of the floor while you keep your hips on the floor. Keep the back of your head in line with the curve in your back. Look at the floor while you do this. 4. Stay in this position for 3-5 seconds. 5. Slowly lower your chest and your face to the floor. Contact a doctor if:  Your back pain gets a lot worse when you do an exercise.  Your back pain does not get better 2 hours after you exercise. If you have any of these problems, stop  doing the exercises. Do not do them again unless your doctor says it is okay. Get help right away if:  You have sudden, very bad back pain. If this happens, stop doing the exercises. Do not do them again unless your doctor says it is okay. This information is not intended to replace advice given to you by your health care provider. Make sure you discuss any questions you have with your health care provider. Document Revised: 12/23/2017 Document Reviewed: 12/23/2017 Elsevier Patient Education  2020 [employer].

## 2019-09-25 ENCOUNTER — Ambulatory Visit (INDEPENDENT_AMBULATORY_CARE_PROVIDER_SITE_OTHER): Payer: Medicare Other | Admitting: *Deleted

## 2019-09-25 DIAGNOSIS — R600 Localized edema: Secondary | ICD-10-CM

## 2019-09-25 DIAGNOSIS — I10 Essential (primary) hypertension: Secondary | ICD-10-CM

## 2019-09-25 NOTE — Patient Instructions (Signed)
Visit Information  Goals Addressed            This Visit's Progress   . "I want the swelling in my legs to get better"       CARE PLAN ENTRY (see longitudinal plan of care for additional care plan information)  Current Barriers:  . Care Coordination needs related to lower extremity edema in a patient with hypertension (disease states)  Nurse Case Manager Clinical Goal(s):  Marland Kitchen Over the next 30 days, patient will f/u with PCP if edema worsens . Over the next 45 days, patient will talk with [facility] regarding edema  Interventions:  . Inter-disciplinary care team collaboration (see longitudinal plan of care) . Chart reviewed including recent office notes and lab results . Talked with patient by telephone . Reviewed and discussed medications . Discussed that patient has started taking dark cherry juice and her swelling has all but resolved. She has lost 5 lbs of fluid.  . She has stopped the lasix since the swelling is better . RN to update PCP . Encouraged patient to reach out to PCP with any new or worsening symptoms . Encouraged to reach out to [facility] as needed  Patient Self Care Activities:  . Performs ADL's independently . Performs IADL's independently  Please see past updates related to this goal by clicking on the "Past Updates" button in the selected goal         The patient verbalized understanding of instructions provided today and declined a print copy of patient instruction materials.   Follow-up Plan The care management team will reach out to the patient again over the next 45 days.   Chong Sicilian, BSN, RN-BC Embedded Chronic Care Manager [facility] [place] [facility] Direct Dial: [phone]

## 2019-09-25 NOTE — Chronic Care Management (AMB) (Signed)
Chronic Care Management   Follow Up Note   09/25/2019 Name: [name] MRN: [id number] DOB: October 13, 1935  Referred by: [name], DO Reason for referral : Chronic Care Management (RN follow up)   [name] is a 84 y.o. year old female who is a primary care patient of [name], DO. The CCM team was consulted for assistance with chronic disease management and care coordination needs.    Review of patient status, including review of consultants reports, relevant laboratory and other test results, and collaboration with appropriate care team members and the patient's provider was performed as part of comprehensive patient evaluation and provision of chronic care management services.    SDOH (Social Determinants of Health) assessments performed: No See Care Plan activities for detailed interventions related to [facility])     Outpatient Encounter Medications as of 09/25/2019  Medication Sig  . acetaminophen (TYLENOL) 500 MG tablet Take 1,000 mg by mouth daily as needed for moderate pain or headache.  . ALOE VERA PO Take 1 capsule by mouth daily.  Marland Kitchen amLODipine (NORVASC) 10 MG tablet Take 1 tablet (10 mg total) by mouth daily.  . Ascorbic Acid (VITAMIN C) 1000 MG tablet Take 1,000 mg by mouth daily.  Marland Kitchen aspirin EC 81 MG tablet Take 1 tablet (81 mg total) by mouth daily.  Marland Kitchen BEE POLLEN PO Take 400 mg by mouth daily.   . clobetasol cream (TEMOVATE) 0.16 % APPLY 1 APPLICATION TOPICALLY 2 (TWO) TIMES DAILY AS NEEDED.  Marland Kitchen Coenzyme Q10 (COQ-10) 100 MG CAPS Take 100 mg by mouth daily.  . furosemide (LASIX) 20 MG tablet Take 1 tablet (20 mg total) by mouth daily. Stopped taking  . meclizine (ANTIVERT) 25 MG tablet Take 0.5-1 tablets (12.5-25 mg total) by mouth 3 (three) times daily as needed for dizziness.  . metoprolol tartrate (LOPRESSOR) 50 MG tablet TAKE 1 TABLET BY MOUTH TWICE A DAY  . mupirocin ointment (BACTROBAN) 2 % Apply to the affected area twice daily for 7-10 days  . OVER  THE COUNTER MEDICATION Take 1 capsule by mouth 2 (two) times daily. Hemp Oil  . prednisoLONE acetate (PRED FORTE) 1 % ophthalmic suspension Place 1 drop into the right eye 4 (four) times daily. (Patient taking differently: Place 1 drop into the right eye 2 (two) times a day. )  . rosuvastatin (CRESTOR) 5 MG tablet Take 1 tablet (5 mg total) by mouth daily.  Marland Kitchen UNABLE TO FIND Med Name: HEMP- Tumeric cream / RUB   No facility-administered encounter medications on file as of 09/25/2019.     RN Care Plan           This Visit's Progress   . "I want the swelling in my legs to get better"       CARE PLAN ENTRY (see longitudinal plan of care for additional care plan information)  Current Barriers:  . Care Coordination needs related to lower extremity edema in a patient with hypertension (disease states)  Nurse Case Manager Clinical Goal(s):  Marland Kitchen Over the next 30 days, patient will f/u with PCP if edema worsens . Over the next 45 days, patient will talk with [facility] regarding edema  Interventions:  . Inter-disciplinary care team collaboration (see longitudinal plan of care) . Chart reviewed including recent office notes and lab results . Talked with patient by telephone . Reviewed and discussed medications . Discussed that patient has started taking dark cherry juice and her swelling has all but resolved. She has  lost 5 lbs of fluid.  . She has stopped the lasix since the swelling is better . RN to update PCP . Encouraged patient to reach out to PCP with any new or worsening symptoms . Encouraged to reach out to [facility] as needed  Patient Self Care Activities:  . Performs ADL's independently . Performs IADL's independently  Please see past updates related to this goal by clicking on the "Past Updates" button in the selected goal          Plan:   The care management team will reach out to the patient again over the next 45 days.    Chong Sicilian, BSN, RN-BC Embedded Chronic Care  Manager [facility] [place] [facility] Direct Dial: [phone]

## 2019-09-29 ENCOUNTER — Ambulatory Visit: Payer: Medicare Other | Admitting: Family Medicine

## 2019-10-05 ENCOUNTER — Other Ambulatory Visit: Payer: Self-pay | Admitting: Cardiovascular Disease

## 2019-10-12 IMAGING — CT CT HEAD WITHOUT CONTRAST
3 of 14 series · 16 of 47 positions shown, 18 images · non-contrast
Comparison: [HOSPITAL] [facility] and cervical spine CT
07/17/2018

CLINICAL DATA: 83-year-old female status post trip and fall in
parking lot yesterday. Headache and dizziness.

EXAM:
CT HEAD WITHOUT CONTRAST
CT MAXILLOFACIAL WITHOUT CONTRAST
CT CERVICAL SPINE WITHOUT CONTRAST
TECHNIQUE: Multidetector CT imaging of the head, cervical spine, and
maxillofacial structures were performed using the standard protocol
without intravenous contrast. Multiplanar CT image reconstructions
of the cervical spine and maxillofacial structures were also
generated.

[Series 7: max soft · axial · 0.37mm/px · z∈[+88,+206]mm · 6 of 83 slices shown]
[im 12/83  brain]
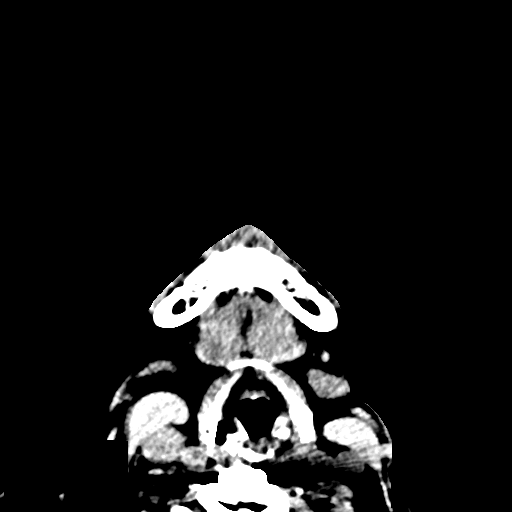
[im 24/83  brain]
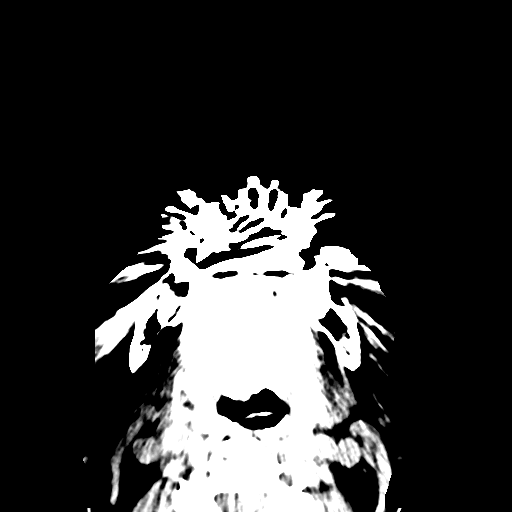
[im 36/83  brain]
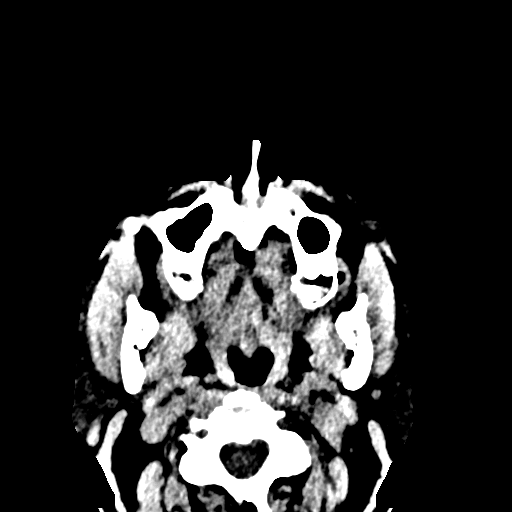
[im 47/83  brain]
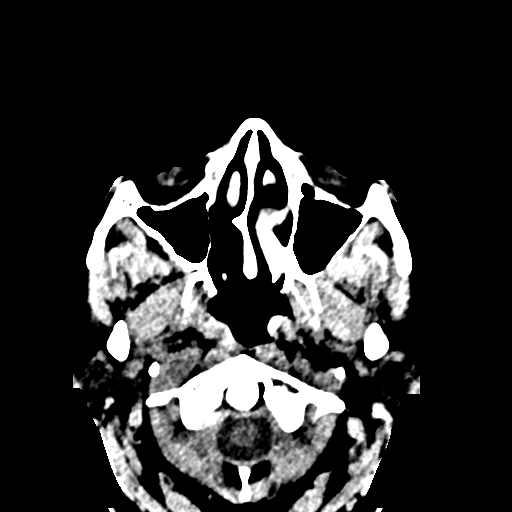
[im 59/83  brain]
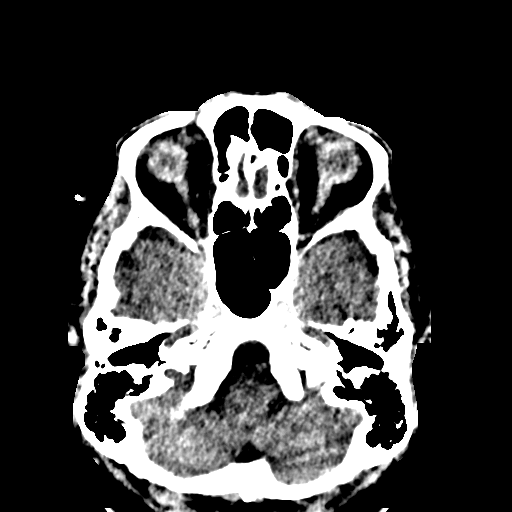
[im 71/83  brain]
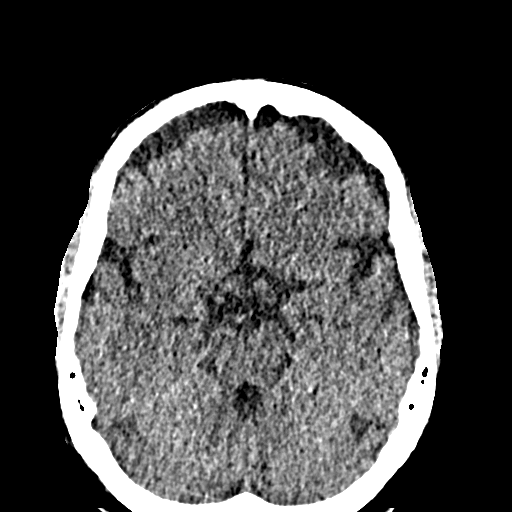

[Series 24: c spine soft · axial · 0.33mm/px · z∈[+50,+98]mm · 3 of 87 slices shown]
[im 13/87  brain]
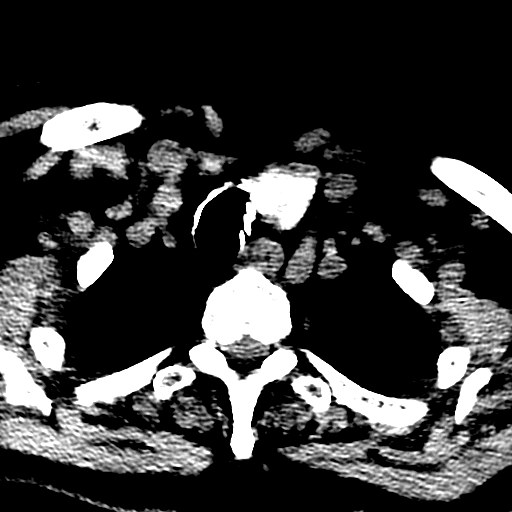
[im 25/87  brain]
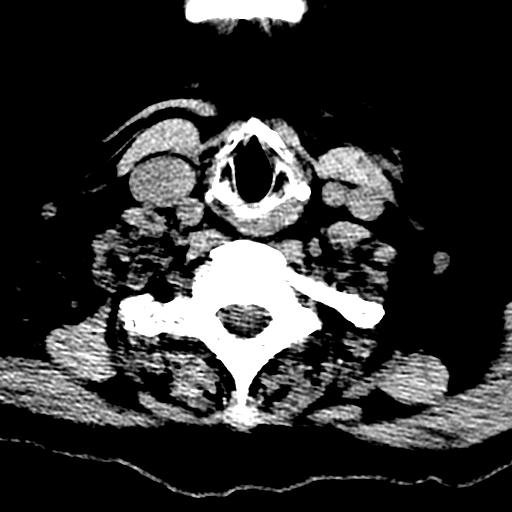
[im 37/87  brain]
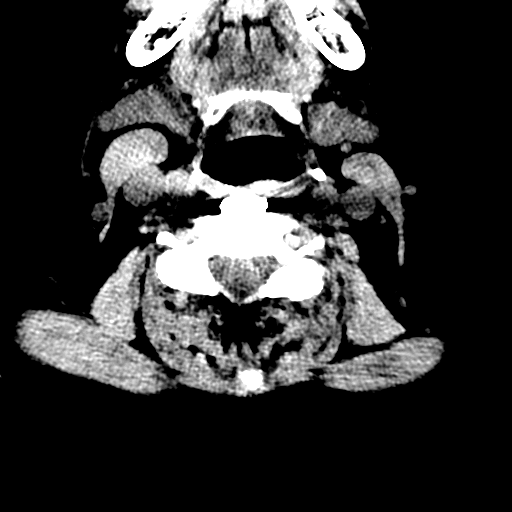

[Series 27: orthogonal axials · axial · 0.21mm/px · z∈[+37,+164]mm · 7 of 93 slices shown, 9 images]
[im 12/93  brain]
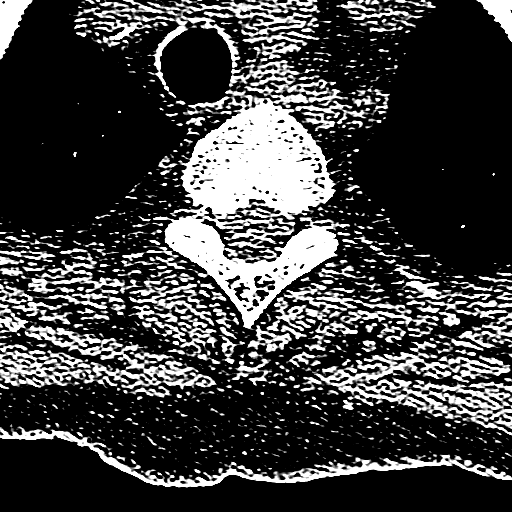
[im 12/93  bone]
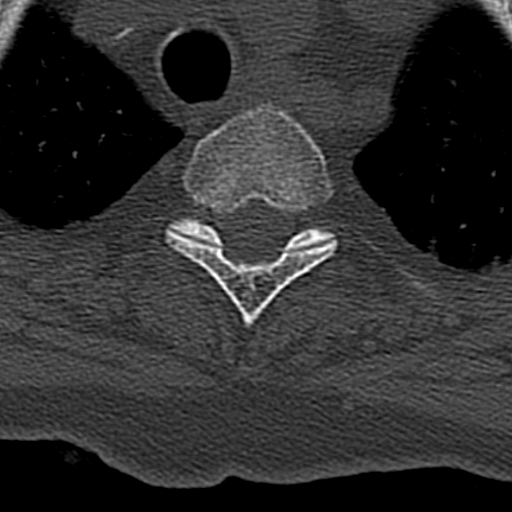
[im 24/93  brain]
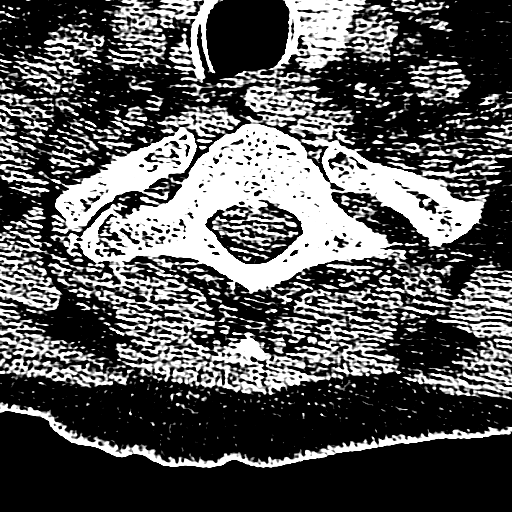
[im 35/93  brain]
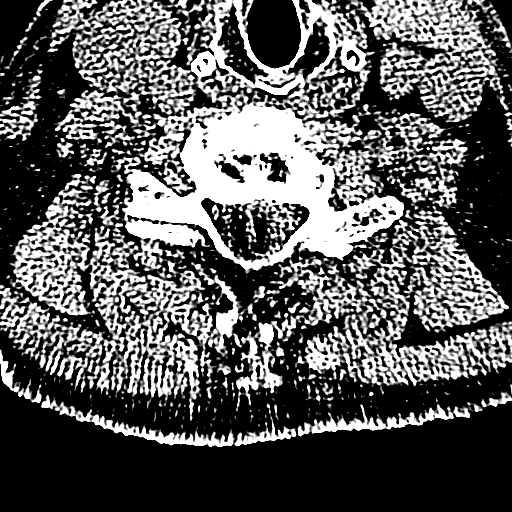
[im 47/93  brain]
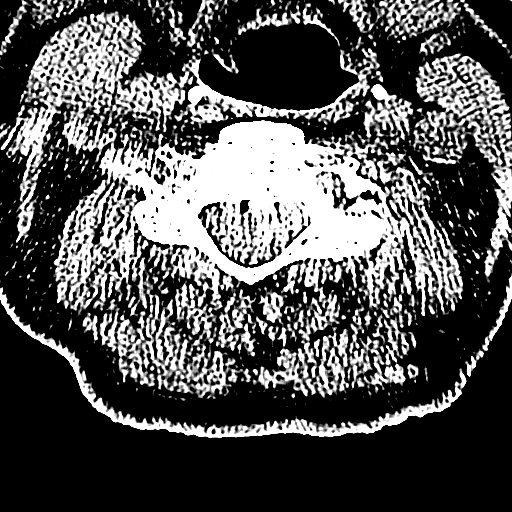
[im 58/93  brain]
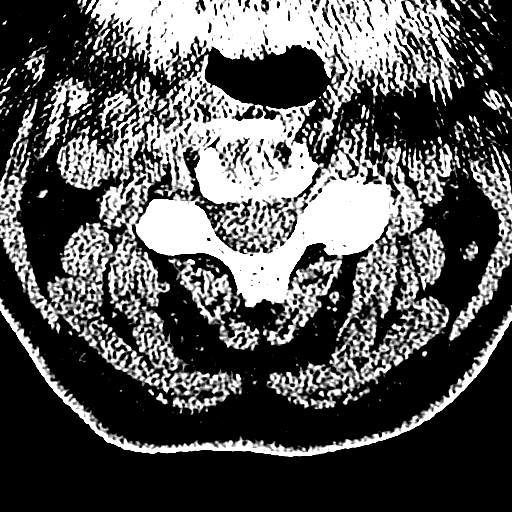
[im 58/93  bone]
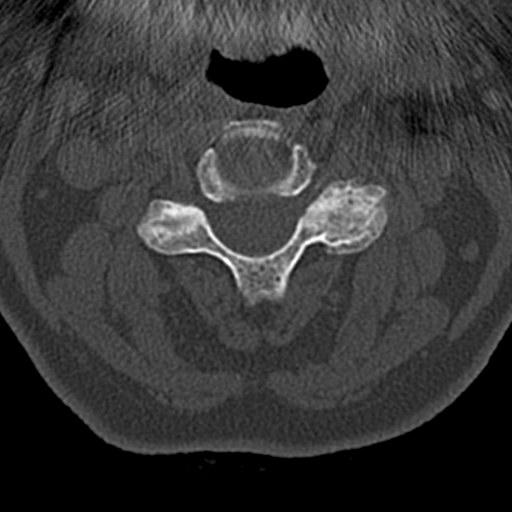
[im 70/93  brain]
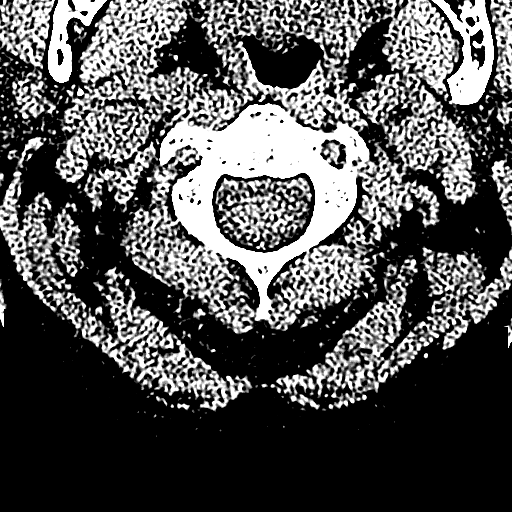
[im 81/93  brain]
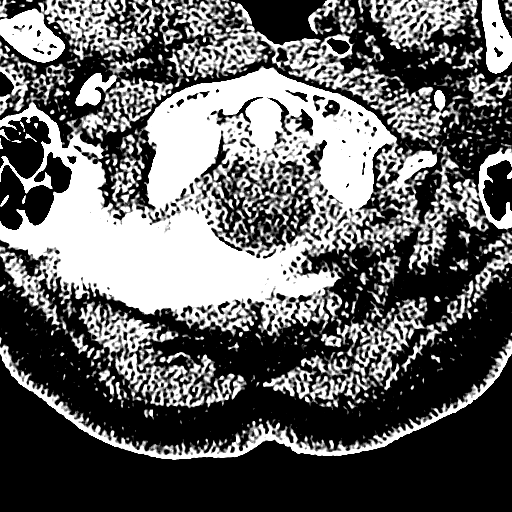

[16 of 47 positions shown; findings below may reference images not displayed]

FINDINGS: CT HEAD FINDINGS

Brain: Stable cerebral volume. No midline shift, mass effect, or
evidence of intracranial mass lesion. No ventriculomegaly. Stable
gray-white matter differentiation throughout the brain, minimal to
mild for age white matter hypodensity. No cortically based acute
infarct identified.

No acute intracranial hemorrhage identified.

Vascular: Calcified atherosclerosis at the skull base. Tortuous
intracranial arteries. No suspicious intracranial vascular
hyperdensity.

Skull: Stable, intact.

Other: No acute scalp soft tissue findings.

CT MAXILLOFACIAL FINDINGS

Osseous: Mandible intact. No maxilla or zygoma fracture. Central
skull base intact.

Orbits: Orbital walls intact. Symmetric and negative orbits soft
tissues.

Sinuses: Clear.  Tympanic cavities and mastoids are clear.

Soft tissues: Negative visible noncontrast larynx, pharynx,
parapharyngeal spaces, retropharyngeal space, sublingual space,
submandibular spaces, parotid and masticator spaces. No definite
superficial soft tissue injury.

CT CERVICAL SPINE FINDINGS

Alignment: Stable straightening of cervical lordosis.
Cervicothoracic junction alignment is within normal limits.
Bilateral posterior element alignment is within normal limits.

Skull base and vertebrae: Visualized skull base is intact. No
atlanto-occipital dissociation. No acute osseous abnormality
identified.

Soft tissues and spinal canal: No prevertebral fluid or swelling. No
visible canal hematoma. Negative noncontrast neck soft tissues.

Disc levels: Prior C5-C6 ACDF with solid arthrodesis. Stable
generally mild for age degenerative changes elsewhere. Advanced
chronic T1-T2 disc and endplate degeneration.

Upper chest: Visible upper thoracic levels appear stable and intact.
Negative lung apices. Negative noncontrast thoracic inlet.
IMPRESSION: 1. No acute traumatic injury identified in the head, face, or
cervical spine.
2. Stable and largely negative for age noncontrast CT appearance of
the brain.
3. Stable cervical spine, prior C5-C6 ACDF with solid arthrodesis.

## 2019-11-05 NOTE — Progress Notes (Addendum)
Virtual Visit via Video Note   This visit type was conducted due to national recommendations for restrictions regarding the COVID-19 Pandemic (e.g. social distancing) in an effort to limit this patient's exposure and mitigate transmission in our community.  Due to her co-morbid illnesses, this patient is at least at moderate risk for complications without adequate follow up.  This format is felt to be most appropriate for this patient at this time.  All issues noted in this document were discussed and addressed.  No physical exam was performed with this format.  Please refer to the patient's chart for her consent to telehealth for [facility].   Patient Location: Home Physician Location:  Office  Date:  11/05/2019   ID:  [name], DOB 1936/03/20, MRN [id number]  PCP:  [name], DO  Cardiologist:  Dr. Ala Bent: Dr. Stephannie Peters   Chief Complaint: BP follow up  History of Present Illness:   [name] is a 84 y.o. female with a history of HTN, HLD, fibromyalgia, GERD and severe ASs/p TAVR (10/19/17) who presents to clinic for  follow up.TAVR 10/19/17 with 20 mm Sapien no CAD at cath post implant mean gradient 12 mmHg despite Small size of valve. Moderate PVL BP improved with cozaar She called office 05/04/19 with mailaise, fatigue and poor appetite Not thought to be related to heart.   TTE 03/07/19 EF 60-65%  Trace MR TAVR 20 mm Sapien mean gradient 19 mmhg  Peak 36 mmHg moderate PVL  Labs 05/08/19 TSH normal  K 5.3 Cr 0.94 Hb 13.4   Seen by primary 05/08/19 for dizziness going on for couple of months Room spinning Not postural no frank syncope  She was given antivert ECG in office was normal with no arrhythmia reviewed   Cardiac CTA 06/27/19 reviewed HALT but no HAM no thrombus Small gap outside the right and non coronary cusp likely responsible for PvL   Some functional fatigue no CHF signs / or dyspnea Taking Cherry extract for LE edema ? She says it  works  No dyspnea palpitations or chest pain Told her she could stop her Plavix No CAD at cath  Needs f/u TTE in July   Past Medical History:  Diagnosis Date  . Chronic headaches   . Fibromyalgia   . GERD (gastroesophageal reflux disease)   . Hemorrhoids    INTERNAL--  POST BANDING 02/ 2015  . History of kidney stones   . Hyperlipidemia   . Hypertension   . Moderate aortic stenosis   . OSA (obstructive sleep apnea) CPAP INTOLERANT   . Osteoporosis   . S/P TAVR (transcatheter aortic valve replacement) 10/19/2017   20 mm Edwards Sapien 3 transcatheter heart valve placed via percutaneous right transfemoral approach   . Severe aortic stenosis   . Spinal stenosis, multilevel     Past Surgical History:  Procedure Laterality Date  . ANTERIOR CERVICAL DECOMP/DISCECTOMY FUSION  11-11-1999   C5 - C6  . APPENDECTOMY  AGE 89  . CARDIAC CATHETERIZATION  2008  DR [facility]   ESSENTIALLY NORMAL  . CATARACT EXTRACTION Bilateral   . CATARACT EXTRACTION W/ INTRAOCULAR LENS  IMPLANT, BILATERAL  2012  . EYE SURGERY    . FLEXIBLE SIGMOIDOSCOPY N/A 06/01/2013   Procedure: FLEXIBLE SIGMOIDOSCOPY;  Surgeon: Inda Castle, MD;  Location: [facility];  Service: Endoscopy;  Laterality: N/A;  may need hemorrhoidal banding  . HEMORRHOIDECTOMY WITH HEMORRHOID BANDING  08-07-2010  . IR RADIOLOGY PERIPHERAL GUIDED IV  START  06/27/2019  . IR US GUIDE VASC ACCESS RIGHT  06/27/2019  . LAPAROSCOPIC CHOLECYSTECTOMY  1990  . RIGHT/LEFT HEART CATH AND CORONARY ANGIOGRAPHY N/A 09/29/2017   Procedure: RIGHT/LEFT HEART CATH AND CORONARY ANGIOGRAPHY;  Surgeon: Sherren Mocha, MD;  Location: [place];  Service: Cardiovascular;  Laterality: N/A;  . SHOULDER ARTHROSCOPY WITH OPEN ROTATOR CUFF REPAIR AND DISTAL CLAVICLE ACROMINECTOMY Right 05/25/2012   Procedure: SHOULDER ARTHROSCOPY WITH OPEN ROTATOR CUFF REPAIR AND DISTAL CLAVICLE ACROMINECTOMY;  Surgeon: Magnus Sinning, MD;  Location: [place];  Service: Orthopedics;  Laterality: Right;  RIGHT SHOULDER ARTHROSCOPY WITH DERBRIDEMENTOF LABRAL/BICEP TENDON, OPEN DISTAL CLAVICLE RESECTION, ANTERIOR ACROMINECTOMY ROTATOR CUFF REPAIR ANESTHESIA: GENERAL/SCALENE NERVE BLOCK  . TEE WITHOUT CARDIOVERSION Bilateral 10/19/2017   Procedure: TRANSESOPHAGEAL ECHOCARDIOGRAM (TEE);  Surgeon: Sherren Mocha, MD;  Location: [place];  Service: Open Heart Surgery;  Laterality: Bilateral;  . TENOSYNOVECTOMY Left 01/04/2014   Procedure: LEFT WRIST EXTENSOR TENOSYNOVECTOMY;  Surgeon: Linna Hoff, MD;  Location: [facility];  Service: Orthopedics;  Laterality: Left;  . THYROIDECTOMY  AGE 46   GOITER  . TONSILLECTOMY AND ADENOIDECTOMY  AGE 49  . TRANSCATHETER AORTIC VALVE REPLACEMENT, TRANSFEMORAL  10/19/2017  . TRANSCATHETER AORTIC VALVE REPLACEMENT, TRANSFEMORAL Bilateral 10/19/2017   Procedure: TRANSCATHETER AORTIC VALVE REPLACEMENT, TRANSFEMORAL;  Surgeon: Sherren Mocha, MD;  Location: [place];  Service: Open Heart Surgery;  Laterality: Bilateral;  . TRANSTHORACIC ECHOCARDIOGRAM  last one 04-20-2013  DR [facility]    NORMAL LVSF/ EF 60-65%/ MODERATE  AV  STENOSIS WITH NO AR /  MILD LAE  . UMBILICAL HERNIA REPAIR  JUNE 2006  . VAGINAL HYSTERECTOMY  01-31-2001   ANTERIOR & POSTERIOR REPAIR/ TRANSVAGINAL BLADDER SLING    Current Medications: Prior to Admission medications   Medication Sig Start Date End Date Taking? Authorizing Provider  acetaminophen (TYLENOL) 500 MG tablet Take 1,000 mg by mouth daily as needed for moderate pain or headache.    [provider]  ALOE VERA PO Take 1 capsule by mouth daily.    [provider]  aspirin EC 81 MG tablet Take 1 tablet (81 mg total) by mouth daily. 01/12/18   [name] M, DO  BEE POLLEN PO Take 400 mg by mouth daily.     [provider]  clobetasol cream (TEMOVATE) 2.44 % Apply 1 application topically 2 (two) times daily as needed. Patient taking differently:  Apply 1 application topically 2 (two) times daily as needed (for rash).  03/19/17   [name], MD  clopidogrel (PLAVIX) 75 MG tablet Take 1 tablet (75 mg total) by mouth daily with breakfast. 01/12/18   [name] M, DO  Coenzyme Q10 (COQ-10) 100 MG CAPS Take 100 mg by mouth daily.    [provider]  diclofenac sodium (VOLTAREN) 1 % GEL APPLY 3 GRAMS TO 3 LARGE JOINTS UP TO 3 TIMES DIALY 12/14/17   Bo Merino, MD  losartan (COZAAR) 25 MG tablet Take 1 tablet (25 mg total) by mouth daily. 01/17/18   [name], MD  metoprolol tartrate (LOPRESSOR) 50 MG tablet Take 1 tablet (50 mg total) by mouth 2 (two) times daily. 01/17/18   [name], MD  OVER THE COUNTER MEDICATION Take 1 capsule by mouth 2 (two) times daily. Hemp Oil    [provider]  polyethylene glycol (MIRALAX / GLYCOLAX) packet Take 17 g by mouth daily as needed for moderate constipation.    [provider]  Polyethylene  Glycol 400 (BLINK TEARS) 0.25 % SOLN Place 1-2 drops into both eyes 3 (three) times daily.     [provider]  rosuvastatin (CRESTOR) 5 MG tablet Take 1 tablet (5 mg total) by mouth daily. 01/12/18   [name], DO  TURMERIC PO Take 1 capsule by mouth daily.    [provider]  vitamin B-12 (CYANOCOBALAMIN) 1000 MCG tablet Take 1,000 mcg by mouth daily.    [provider]    Allergies:   Tape and Prolia [denosumab]   Social History   Socioeconomic History  . Marital status: Married    Spouse name: Shanon Brow  . Number of children: 6  . Years of education: 6  . Highest education level: 6th grade  Occupational History  . Occupation: Retired  Tobacco Use  . Smoking status: Former Smoker    Packs/day: 0.25    Years: 5.00    Pack years: 1.25    Types: Cigarettes    Quit date: 04/14/2003    Years since quitting: 16.5  . Smokeless tobacco: Never Used  Vaping Use  . Vaping Use: Never used  Substance and Sexual Activity  . Alcohol  use: Yes    Comment: once in a while-social  . Drug use: Never    Comment: hemp oil   . Sexual activity: Not Currently  Other Topics Concern  . Not on file  Social History Narrative  . Not on file   Social Determinants of Health   Financial Resource Strain: Low Risk   . Difficulty of Paying Living Expenses: Not hard at all  Food Insecurity: No Food Insecurity  . Worried About Charity fundraiser in the Last Year: Never true  . Ran Out of Food in the Last Year: Never true  Transportation Needs: No Transportation Needs  . Lack of Transportation (Medical): No  . Lack of Transportation (Non-Medical): No  Physical Activity: Inactive  . Days of Exercise per Week: 0 days  . Minutes of Exercise per Session: 0 min  Stress: No Stress Concern Present  . Feeling of Stress : Not at all  Social Connections: Socially Integrated  . Frequency of Communication with Friends and Family: More than three times a week  . Frequency of Social Gatherings with Friends and Family: Once a week  . Attends Religious Services: More than 4 times per year  . Active Member of Clubs or Organizations: Yes  . Attends Archivist Meetings: More than 4 times per year  . Marital Status: Married     Family History:  The patient's family history includes Cancer (age of onset: 60) in her son; Cirrhosis in her mother; Gallbladder disease in her maternal grandmother; Heart disease in her sister; Other in her brother; Stomach cancer in her paternal grandfather.   ROS:   Please see the history of present illness.    ROS All other systems reviewed and are negative.   PHYSICAL EXAM:   VS:  There were no vitals taken for this visit.    Phone interview no exam     Wt Readings from Last 3 Encounters:  09/19/19 162 lb (73.5 kg)  09/07/19 159 lb 3.2 oz (72.2 kg)  08/28/19 162 lb 6.4 oz (73.7 kg)      Studies/Labs Reviewed:   EKG:   05/10/19  SR rate 82 normal   Recent Labs: 09/07/2019: ALT 16; BNP 41.6;  BUN 16; Creatinine, Ser 0.88; Hemoglobin 13.6; Platelets 218; Potassium 4.4; Sodium 139; TSH 2.730  Lipid Panel    Component Value Date/Time   CHOL 145 05/25/2017 0844   CHOL 219 (H) 09/07/2012 0935   TRIG 136 05/25/2017 0844   TRIG 180 (H) 02/17/2013 1007   TRIG 326 (H) 09/07/2012 0935   HDL 47 05/25/2017 0844   HDL 51 02/17/2013 1007   HDL 47 09/07/2012 0935   CHOLHDL 3.1 05/25/2017 0844   CHOLHDL 5.2 10/09/2006 0435   VLDL 58 (H) 10/09/2006 0435   LDLCALC 71 05/25/2017 0844   LDLCALC 89 02/17/2013 1007   LDLCALC 107 (H) 09/07/2012 0935    Additional studies/ records that were reviewed today include:    2D ECHO 11/25/17 ( 1 month s/p TAVR)  Study Conclusion - Left ventricle: The cavity size was normal. There was mild focal basal hypertrophy of the septum. Systolic function was normal. The estimated ejection fraction was in the range of 60% to 65%. Wall motion was normal; there were no regional wall motion abnormalities. Doppler parameters are consistent with abnormal left ventricular relaxation (grade 1 diastolic dysfunction). - Aortic valve: A TAVR bioprosthesis was present and functioning normally. There was mild perivalvular regurgitation. Peak velocity (S): 244 cm/s. Mean gradient (S): 12 mm Hg. - Mitral valve: Severely calcified annulus. - Left atrium: The atrium was mildly dilated Impressions - Compared to the prior study, there has been no significant interval change.   RIGHT/LEFT HEART CATH AND CORONARY ANGIOGRAPHY  09/29/17  Conclusion   1.  Known severe aortic stenosis by noninvasive assessment with heavily calcified and restricted aortic valve leaflets biplane fluoroscopy 2.  Heavy mitral annular calcium 3.  Widely patent coronary arteries with minor luminal irregularities 4.  Mildly elevated right heart pressures   Recommendation: Continued multidisciplinary heart team evaluation for treatment of severe aortic stenosis      ASSESSMENT & PLAN:    1. HTN -improved on higher dose ARB some white coat component and anxiety  2. Severe AS s/p TVAR - suboptimal result with small 20 mm Sapien valve and moderate PVL mean gradient increasing form 12->19 mmHg Discussed SBE prophylaxis f/u echo November 2021   not clear she would be a candidate for "plugging" procedure should PVL get worse CTA with no thrombus on valve 06/27/19  3. Palpitations -  Reviewed Zio monitor done 02/2018 no significant arrhythmia continue beta blocker ECG normal   4. Vertigo:  Meclizine as needed f/u primary   5. Fatigue/Mailaise:  Non cardiac EF is normal 60-65% ECG normal no arrhythmia or heart block labs seem ok   6. HLD:  Continue statin labs with primary    Medication Adjustments/Labs and Tests Ordered: Current medicines are reviewed at length with the patient today.  Concerns regarding medicines are outlined above.  Medication changes, Labs and Tests ordered today are listed in the Patient Instructions below. There are no Patient Instructions on file for this visit.   F/U with me in November with echo same day post TAVR   Time:  Reviewing chart, echo's , cardiac CTA, direct patient interview and composing note 30 minutes   Signed, Jenkins Rouge, MD  11/05/2019 12:59 PM    [facility] [place], [place], [place]  [address] Phone: [phone]; Fax: [phone]

## 2019-11-08 ENCOUNTER — Other Ambulatory Visit: Payer: Self-pay

## 2019-11-08 ENCOUNTER — Telehealth (INDEPENDENT_AMBULATORY_CARE_PROVIDER_SITE_OTHER): Payer: Medicare Other | Admitting: Cardiovascular Disease

## 2019-11-08 VITALS — BP 146/53 | HR 58 | Ht 62.0 in | Wt 160.0 lb

## 2019-11-08 DIAGNOSIS — Z952 Presence of prosthetic heart valve: Secondary | ICD-10-CM

## 2019-11-08 DIAGNOSIS — I350 Nonrheumatic aortic (valve) stenosis: Secondary | ICD-10-CM

## 2019-11-08 NOTE — Patient Instructions (Addendum)
Medication Instructions:  *If you need a refill on your cardiac medications before your next appointment, please call your pharmacy*  Lab Work: If you have labs (blood work) drawn today and your tests are completely normal, you will receive your results only by: Marland Kitchen MyChart Message (if you have MyChart) OR . A paper copy in the mail If you have any lab test that is abnormal or we need to change your treatment, we will call you to review the results.  Testing/Procedures: Your physician has requested that you have an echocardiogram in 4 months. Echocardiography is a painless test that uses sound waves to create images of your heart. It provides your doctor with information about the size and shape of your heart and how well your heart's chambers and valves are working. This procedure takes approximately one hour. There are no restrictions for this procedure.  Follow-Up: At [facility], you and your health needs are our priority.  As part of our continuing mission to provide you with exceptional heart care, we have created designated Provider Care Teams.  These Care Teams include your primary Cardiologist (physician) and Advanced Practice Providers (APPs -  Physician Assistants and Nurse Practitioners) who all work together to provide you with the care you need, when you need it.  We recommend signing up for the patient portal called "MyChart".  Sign up information is provided on this After Visit Summary.  MyChart is used to connect with patients for Virtual Visits (Telemedicine).  Patients are able to view lab/test results, encounter notes, upcoming appointments, etc.  Non-urgent messages can be sent to your provider as well.   To learn more about what you can do with MyChart, go to [email or web address].    Your next appointment:   4 months  The format for your next appointment:   In Person  Provider:   You may see Jenkins Rouge, MD or one of the following Advanced Practice Providers on  your designated Care Team:    Truitt Merle, NP  Cecilie Kicks, NP  Kathyrn Drown, NP

## 2019-11-10 ENCOUNTER — Other Ambulatory Visit: Payer: Self-pay | Admitting: Family Medicine

## 2019-11-10 DIAGNOSIS — R600 Localized edema: Secondary | ICD-10-CM

## 2019-12-06 ENCOUNTER — Other Ambulatory Visit: Payer: Self-pay | Admitting: Cardiovascular Disease

## 2019-12-08 ENCOUNTER — Ambulatory Visit (INDEPENDENT_AMBULATORY_CARE_PROVIDER_SITE_OTHER): Payer: Medicare Other | Admitting: Family Medicine

## 2019-12-08 ENCOUNTER — Ambulatory Visit (INDEPENDENT_AMBULATORY_CARE_PROVIDER_SITE_OTHER): Payer: Medicare Other

## 2019-12-08 ENCOUNTER — Other Ambulatory Visit: Payer: Self-pay

## 2019-12-08 DIAGNOSIS — R1032 Left lower quadrant pain: Secondary | ICD-10-CM

## 2019-12-08 NOTE — Progress Notes (Signed)
Telephone visit  Subjective: CC: abdominal pain PCP: [name], DO HPI:[name] is a 84 y.o. female calls for telephone consult today. Patient provides verbal consent for consult held via phone.  Due to COVID-19 pandemic this visit was conducted virtually. This visit type was conducted due to national recommendations for restrictions regarding the COVID-19 Pandemic (e.g. social distancing, sheltering in place) in an effort to limit this patient's exposure and mitigate transmission in our community. All issues noted in this document were discussed and addressed.  A physical exam was not performed with this format.   Location of patient: home Location of provider: [facility] Others present for call: none  1. Abdominal pain Intermittent and seems to be exacerbated by constipation.  She had recurrence 2-3 days ago.  She has been taking a clean out supplement which is working ok. She reports pain is worse on the left side.  She has been using suppository and she passed ball stool.  Today symptoms are improving some but she still not able to take much in.  She is only eaten an egg today.  She admits that she does not drink much water but she is trying to improve this.  She does have a history of diverticulitis but does not report any vomiting or fevers.  No blood in stool.   ROS: Per HPI  Allergies  Allergen Reactions  . Tape Other (See Comments)    Dermatitis rash "with extended exposure"  . Prolia [Denosumab] Other (See Comments)    Arthralgia/ myalgia/ jaw pain/ headache   Past Medical History:  Diagnosis Date  . Chronic headaches   . Fibromyalgia   . GERD (gastroesophageal reflux disease)   . Hemorrhoids    INTERNAL--  POST BANDING 02/ 2015  . History of kidney stones   . Hyperlipidemia   . Hypertension   . Moderate aortic stenosis   . OSA (obstructive sleep apnea) CPAP INTOLERANT   . Osteoporosis   . S/P TAVR (transcatheter aortic valve replacement) 10/19/2017   20 mm  Edwards Sapien 3 transcatheter heart valve placed via percutaneous right transfemoral approach   . Severe aortic stenosis   . Spinal stenosis, multilevel     Current Outpatient Medications:  .  acetaminophen (TYLENOL) 500 MG tablet, Take 1,000 mg by mouth daily as needed for moderate pain or headache., Disp: , Rfl:  .  ALOE VERA PO, Take 1 capsule by mouth daily., Disp: , Rfl:  .  amLODipine (NORVASC) 10 MG tablet, Take 1 tablet (10 mg total) by mouth daily., Disp: 90 tablet, Rfl: 3 .  Ascorbic Acid (VITAMIN C) 1000 MG tablet, Take 1,000 mg by mouth daily., Disp: , Rfl:  .  aspirin EC 81 MG tablet, Take 1 tablet (81 mg total) by mouth daily., Disp: 90 tablet, Rfl: 3 .  BEE POLLEN PO, Take 400 mg by mouth daily. , Disp: , Rfl:  .  clobetasol cream (TEMOVATE) 2.62 %, APPLY 1 APPLICATION TOPICALLY 2 (TWO) TIMES DAILY AS NEEDED., Disp: 30 g, Rfl: 3 .  Coenzyme Q10 (COQ-10) 100 MG CAPS, Take 100 mg by mouth daily., Disp: , Rfl:  .  metoprolol tartrate (LOPRESSOR) 50 MG tablet, TAKE 1 TABLET BY MOUTH TWICE A DAY, Disp: 180 tablet, Rfl: 3 .  Misc Natural Products (TART CHERRY ADVANCED PO), Take by mouth daily., Disp: , Rfl:  .  mupirocin ointment (BACTROBAN) 2 %, Apply to the affected area twice daily for 7-10 days, Disp: 22 g, Rfl: 0 .  ofloxacin (OCUFLOX) 0.3 % ophthalmic solution, INSTILL 1 DROP 4 TIMES DAILY INTO LEFT EYE FOR 2 DAYS, Disp: , Rfl:  .  OVER THE COUNTER MEDICATION, Take 1 capsule by mouth 2 (two) times daily. Hemp Oil, Disp: , Rfl:  .  prednisoLONE acetate (PRED FORTE) 1 % ophthalmic suspension, Place 1 drop into the right eye 4 (four) times daily. (Patient taking differently: Place 1 drop into the right eye 2 (two) times a day. ), Disp: 10 mL, Rfl: 0 .  rosuvastatin (CRESTOR) 5 MG tablet, Take 1 tablet (5 mg total) by mouth daily., Disp: 90 tablet, Rfl: 1 .  UNABLE TO FIND, Med Name: HEMP- Tumeric cream / RUB, Disp: , Rfl:   DG Abd 1 View  Result Date: 12/08/2019 CLINICAL DATA:   Left lower quadrant pain EXAM: ABDOMEN - 1 VIEW COMPARISON:  04/24/2016 FINDINGS: Five lumbar type vertebral bodies are well visualized. Vertebral body height is well maintained. Mild scoliosis concave to the right is noted stable from the prior study. No compression deformity is seen. Scattered large and small bowel gas is noted. No renal calculi are noted. IMPRESSION: No acute abnormality noted. Electronically Signed   By: Inez Catalina M.D.   On: 12/08/2019 16:05    Assessment/ Plan: 84 y.o. female   1. Left lower quadrant abdominal pain No evidence of obstruction.  Push fluids.  Red flags discussed.  She voiced good understanding - DG Abd 1 View; Future   Start time: 1:36pm; called back with results at 5:07pm End time: 1:42pm; 5:08pm  Total time spent on patient care (including telephone call/ virtual visit): 16 minutes  Gates, Fate [phone]

## 2019-12-11 ENCOUNTER — Ambulatory Visit: Payer: Medicare Other

## 2019-12-11 ENCOUNTER — Other Ambulatory Visit: Payer: Self-pay

## 2019-12-14 ENCOUNTER — Other Ambulatory Visit: Payer: Self-pay | Admitting: Cardiovascular Disease

## 2020-01-10 NOTE — Progress Notes (Signed)
This encounter was created in error - please disregard.

## 2020-01-12 ENCOUNTER — Other Ambulatory Visit: Payer: Self-pay | Admitting: Cardiovascular Disease

## 2020-01-28 ENCOUNTER — Other Ambulatory Visit: Payer: Self-pay | Admitting: Cardiovascular Disease

## 2020-02-12 ENCOUNTER — Ambulatory Visit (INDEPENDENT_AMBULATORY_CARE_PROVIDER_SITE_OTHER): Payer: Medicare Other | Admitting: Nurse Practitioner

## 2020-02-12 ENCOUNTER — Other Ambulatory Visit: Payer: Self-pay

## 2020-02-12 ENCOUNTER — Encounter: Payer: Self-pay | Admitting: Nurse Practitioner

## 2020-02-12 VITALS — BP 157/44 | HR 60 | Temp 96.8°F | Resp 20 | Ht 62.0 in | Wt 159.0 lb

## 2020-02-12 DIAGNOSIS — R600 Localized edema: Secondary | ICD-10-CM | POA: Diagnosis not present

## 2020-02-12 DIAGNOSIS — R5382 Chronic fatigue, unspecified: Secondary | ICD-10-CM | POA: Diagnosis not present

## 2020-02-12 NOTE — Progress Notes (Signed)
Established Patient Office Visit  Subjective:  Patient ID: [name], female    DOB: November 30, 1935  Age: 84 y.o. MRN: [id number]  CC:  Chief Complaint  Patient presents with  . Edema    HPI [name] is a 84 year old female who presents to clinic for bilateral lower extremity edema.  This is not new for patient but progressively getting worse in the last couple of weeks.  Patient reports stopping using Lasix 20 mg daily due to dizziness and weakness.   Patient is reporting increased fatigue, and increased somnolence, lack of energy, change in appetite.  She denies fever, nausea, chills any signs or symptoms of infection.  Fatigue is not new for patient but increasingly getting worse.  Patient reports lately she has not been able to do her regular activity due to weakness and generalized fatigue. Past Medical History:  Diagnosis Date  . Chronic headaches   . Fibromyalgia   . GERD (gastroesophageal reflux disease)   . Hemorrhoids    INTERNAL--  POST BANDING 02/ 2015  . History of kidney stones   . Hyperlipidemia   . Hypertension   . Moderate aortic stenosis   . OSA (obstructive sleep apnea) CPAP INTOLERANT   . Osteoporosis   . S/P TAVR (transcatheter aortic valve replacement) 10/19/2017   20 mm Edwards Sapien 3 transcatheter heart valve placed via percutaneous right transfemoral approach   . Severe aortic stenosis   . Spinal stenosis, multilevel     Past Surgical History:  Procedure Laterality Date  . ANTERIOR CERVICAL DECOMP/DISCECTOMY FUSION  11-11-1999   C5 - C6  . APPENDECTOMY  AGE 55  . CARDIAC CATHETERIZATION  2008  [facility]   ESSENTIALLY NORMAL  . CATARACT EXTRACTION Bilateral   . CATARACT EXTRACTION W/ INTRAOCULAR LENS  IMPLANT, BILATERAL  2012  . EYE SURGERY    . FLEXIBLE SIGMOIDOSCOPY N/A 06/01/2013   Procedure: FLEXIBLE SIGMOIDOSCOPY;  Surgeon: Inda Castle, MD;  Location: [facility];  Service: Endoscopy;  Laterality: N/A;  may need hemorrhoidal banding    . HEMORRHOIDECTOMY WITH HEMORRHOID BANDING  08-07-2010  . IR RADIOLOGY PERIPHERAL GUIDED IV START  06/27/2019  . IR US GUIDE VASC ACCESS RIGHT  06/27/2019  . LAPAROSCOPIC CHOLECYSTECTOMY  1990  . RIGHT/LEFT HEART CATH AND CORONARY ANGIOGRAPHY N/A 09/29/2017   Procedure: RIGHT/LEFT HEART CATH AND CORONARY ANGIOGRAPHY;  Surgeon: Sherren Mocha, MD;  Location: [facility];  Service: Cardiovascular;  Laterality: N/A;  . SHOULDER ARTHROSCOPY WITH OPEN ROTATOR CUFF REPAIR AND DISTAL CLAVICLE ACROMINECTOMY Right 05/25/2012   Procedure: SHOULDER ARTHROSCOPY WITH OPEN ROTATOR CUFF REPAIR AND DISTAL CLAVICLE ACROMINECTOMY;  Surgeon: Magnus Sinning, MD;  Location: [place];  Service: Orthopedics;  Laterality: Right;  RIGHT SHOULDER ARTHROSCOPY WITH DERBRIDEMENTOF LABRAL/BICEP TENDON, OPEN DISTAL CLAVICLE RESECTION, ANTERIOR ACROMINECTOMY ROTATOR CUFF REPAIR ANESTHESIA: GENERAL/SCALENE NERVE BLOCK  . TEE WITHOUT CARDIOVERSION Bilateral 10/19/2017   Procedure: TRANSESOPHAGEAL ECHOCARDIOGRAM (TEE);  Surgeon: Sherren Mocha, MD;  Location: [place];  Service: Open Heart Surgery;  Laterality: Bilateral;  . TENOSYNOVECTOMY Left 01/04/2014   Procedure: LEFT WRIST EXTENSOR TENOSYNOVECTOMY;  Surgeon: Linna Hoff, MD;  Location: [facility];  Service: Orthopedics;  Laterality: Left;  . THYROIDECTOMY  AGE 14   GOITER  . TONSILLECTOMY AND ADENOIDECTOMY  AGE 65  . TRANSCATHETER AORTIC VALVE REPLACEMENT, TRANSFEMORAL  10/19/2017  . TRANSCATHETER AORTIC VALVE REPLACEMENT, TRANSFEMORAL Bilateral 10/19/2017   Procedure: TRANSCATHETER AORTIC VALVE REPLACEMENT, TRANSFEMORAL;  Surgeon: Sherren Mocha, MD;  Location: [facility];  Service: Open Heart Surgery;  Laterality: Bilateral;  . TRANSTHORACIC ECHOCARDIOGRAM  last one 04-20-2013  [facility]    NORMAL LVSF/ EF 60-65%/ MODERATE  AV  STENOSIS WITH NO AR /  MILD LAE  . UMBILICAL HERNIA REPAIR  JUNE 2006  . VAGINAL HYSTERECTOMY  01-31-2001    ANTERIOR & POSTERIOR REPAIR/ TRANSVAGINAL BLADDER SLING    Family History  Problem Relation Age of Onset  . Cirrhosis Mother        drinking  . Other Brother        duodenal ulcer  . Heart disease Sister   . Cancer Son 55       colon  . Gallbladder disease Maternal Grandmother   . Stomach cancer Paternal Grandfather     Social History   Socioeconomic History  . Marital status: Married    Spouse name: Shanon Brow  . Number of children: 6  . Years of education: 6  . Highest education level: 6th grade  Occupational History  . Occupation: Retired  Tobacco Use  . Smoking status: Former Smoker    Packs/day: 0.25    Years: 5.00    Pack years: 1.25    Types: Cigarettes    Quit date: 04/14/2003    Years since quitting: 16.8  . Smokeless tobacco: Never Used  Vaping Use  . Vaping Use: Never used  Substance and Sexual Activity  . Alcohol use: Yes    Comment: once in a while-social  . Drug use: Never    Comment: hemp oil   . Sexual activity: Not Currently  Other Topics Concern  . Not on file  Social History Narrative  . Not on file   Social Determinants of Health   Financial Resource Strain:   . Difficulty of Paying Living Expenses: Not on file  Food Insecurity:   . Worried About Charity fundraiser in the Last Year: Not on file  . Ran Out of Food in the Last Year: Not on file  Transportation Needs:   . Lack of Transportation (Medical): Not on file  . Lack of Transportation (Non-Medical): Not on file  Physical Activity:   . Days of Exercise per Week: Not on file  . Minutes of Exercise per Session: Not on file  Stress:   . Feeling of Stress : Not on file  Social Connections:   . Frequency of Communication with Friends and Family: Not on file  . Frequency of Social Gatherings with Friends and Family: Not on file  . Attends Religious Services: Not on file  . Active Member of Clubs or Organizations: Not on file  . Attends Archivist Meetings: Not on file  .  Marital Status: Not on file  Intimate Partner Violence:   . Fear of Current or Ex-Partner: Not on file  . Emotionally Abused: Not on file  . Physically Abused: Not on file  . Sexually Abused: Not on file    Outpatient Medications Prior to Visit  Medication Sig Dispense Refill  . acetaminophen (TYLENOL) 500 MG tablet Take 1,000 mg by mouth daily as needed for moderate pain or headache.    . ALOE VERA PO Take 1 capsule by mouth daily.    Marland Kitchen amLODipine (NORVASC) 10 MG tablet Take 1 tablet (10 mg total) by mouth daily. 90 tablet 3  . Ascorbic Acid (VITAMIN C) 1000 MG tablet Take 1,000 mg by mouth daily.    Marland Kitchen aspirin EC 81 MG tablet Take 1 tablet (81  mg total) by mouth daily. 90 tablet 3  . BEE POLLEN PO Take 400 mg by mouth daily.     . clobetasol cream (TEMOVATE) 6.76 % APPLY 1 APPLICATION TOPICALLY 2 (TWO) TIMES DAILY AS NEEDED. 30 g 3  . Coenzyme Q10 (COQ-10) 100 MG CAPS Take 100 mg by mouth daily.    . furosemide (LASIX) 20 MG tablet Take 20 mg by mouth daily as needed.    . metoprolol tartrate (LOPRESSOR) 50 MG tablet TAKE 1 TABLET BY MOUTH TWICE A DAY 180 tablet 3  . Misc Natural Products (TART CHERRY ADVANCED PO) Take by mouth daily.    . mupirocin ointment (BACTROBAN) 2 % Apply to the affected area twice daily for 7-10 days 22 g 0  . OVER THE COUNTER MEDICATION Take 1 capsule by mouth 2 (two) times daily. Hemp Oil    . prednisoLONE acetate (PRED FORTE) 1 % ophthalmic suspension Place 1 drop into the right eye 4 (four) times daily. (Patient taking differently: Place 1 drop into the right eye 2 (two) times a day. ) 10 mL 0  . rosuvastatin (CRESTOR) 5 MG tablet Take 1 tablet (5 mg total) by mouth daily. 90 tablet 1  . UNABLE TO FIND Med Name: HEMP- Tumeric cream / RUB    . ofloxacin (OCUFLOX) 0.3 % ophthalmic solution INSTILL 1 DROP 4 TIMES DAILY INTO LEFT EYE FOR 2 DAYS     No facility-administered medications prior to visit.    Allergies  Allergen Reactions  . Tape Other (See  Comments)    Dermatitis rash "with extended exposure"  . Prolia [Denosumab] Other (See Comments)    Arthralgia/ myalgia/ jaw pain/ headache    ROS Review of Systems  Musculoskeletal: Positive for joint swelling.  Skin: Positive for color change.  All other systems reviewed and are negative.     Objective:    Physical Exam Vitals reviewed.  HENT:     Head: Normocephalic.     Right Ear: External ear normal. There is no impacted cerumen.     Left Ear: External ear normal. There is no impacted cerumen.     Mouth/Throat:     Mouth: Mucous membranes are moist.     Pharynx: Oropharynx is clear.  Eyes:     Conjunctiva/sclera: Conjunctivae normal.  Cardiovascular:     Rate and Rhythm: Normal rate and regular rhythm.     Pulses: Normal pulses.     Heart sounds: Normal heart sounds.  Pulmonary:     Effort: Pulmonary effort is normal.     Breath sounds: Normal breath sounds.  Abdominal:     General: Bowel sounds are normal.  Musculoskeletal:        General: Swelling and tenderness present.     Right lower leg: Edema present.     Left lower leg: Edema present.  Skin:    General: Skin is warm.  Neurological:     Mental Status: She is alert and oriented to person, place, and time.     BP (!) 157/44   Pulse 60   Temp (!) 96.8 F (36 C)   Resp 20   Ht 5\' 2"  (1.575 m)   Wt 159 lb (72.1 kg)   SpO2 99%   BMI 29.08 kg/m  Wt Readings from Last 3 Encounters:  02/12/20 159 lb (72.1 kg)  11/08/19 160 lb (72.6 kg)  09/19/19 162 lb (73.5 kg)     Health Maintenance Due  Topic Date Due  . COVID-19 Vaccine (  1) Never done  . TETANUS/TDAP  Never done  . PNA vac Low Risk Adult (1 of 2 - PCV13) Never done  . DEXA SCAN  12/11/2018  . MAMMOGRAM  01/24/2020    There are no preventive care reminders to display for this patient.  Lab Results  Component Value Date   TSH 2.730 09/07/2019   Lab Results  Component Value Date   WBC 6.5 09/07/2019   HGB 13.6 09/07/2019   HCT  41.1 09/07/2019   MCV 90 09/07/2019   PLT 218 09/07/2019   Lab Results  Component Value Date   NA 139 09/07/2019   K 4.4 09/07/2019   CO2 25 09/07/2019   GLUCOSE 100 (H) 09/07/2019   BUN 16 09/07/2019   CREATININE 0.88 09/07/2019   BILITOT 0.4 09/07/2019   ALKPHOS 63 09/07/2019   AST 21 09/07/2019   ALT 16 09/07/2019   PROT 7.1 09/07/2019   ALBUMIN 4.5 09/07/2019   CALCIUM 10.0 09/07/2019   ANIONGAP 11 10/21/2017   Lab Results  Component Value Date   CHOL 145 05/25/2017   Lab Results  Component Value Date   HDL 47 05/25/2017   Lab Results  Component Value Date   LDLCALC 71 05/25/2017   Lab Results  Component Value Date   TRIG 136 05/25/2017   Lab Results  Component Value Date   CHOLHDL 3.1 05/25/2017   Lab Results  Component Value Date   HGBA1C 5.7 (H) 10/15/2017      Assessment & Plan:   Problem List Items Addressed This Visit      Other   Chronic fatigue    Not well managed.  Patient is not looking well in clinic today.  Skin: Is gray.  Patient's nail bed is gray in color.  Peripheral refill less than 3 seconds, oxygen saturation 99%.  Labs completed-TSH, anemia panel.  Results pending. Provided education to patient with printed handouts given.       Relevant Orders   Anemia Profile B   Comprehensive metabolic panel   TSH   Bilateral lower extremity edema - Primary    Bilateral lower extremity edema not well managed.  Patient stopped Lasix 20 mg daily.  Advised patient to continue Lasix 20 mg daily, elevate feet, compression socks as tolerated.  Follow-up with worsening or unresolved symptoms.           Follow-up: Return if symptoms worsen or fail to improve.    Ivy Lynn, NP

## 2020-02-12 NOTE — Patient Instructions (Signed)
Fatigue If you have fatigue, you feel tired all the time and have a lack of energy or a lack of motivation. Fatigue may make it difficult to start or complete tasks because of exhaustion. In general, occasional or mild fatigue is often a normal response to activity or life. However, long-lasting (chronic) or extreme fatigue may be a symptom of a medical condition. Follow these instructions at home: General instructions  Watch your fatigue for any changes.  Go to bed and get up at the same time every day.  Avoid fatigue by pacing yourself during the day and getting enough sleep at night.  Maintain a healthy weight. Medicines  Take over-the-counter and prescription medicines only as told by your health care provider.  Take a multivitamin, if told by your health care provider.  Do not use herbal or dietary supplements unless they are approved by your health care provider. Activity   Exercise regularly, as told by your health care provider.  Use or practice techniques to help you relax, such as yoga, tai chi, meditation, or massage therapy. Eating and drinking   Avoid heavy meals in the evening.  Eat a well-balanced diet, which includes lean proteins, whole grains, plenty of fruits and vegetables, and low-fat dairy products.  Avoid consuming too much caffeine.  Avoid the use of alcohol.  Drink enough fluid to keep your urine pale yellow. Lifestyle  Change situations that cause you stress. Try to keep your work and personal schedule in balance.  Do not use any products that contain nicotine or tobacco, such as cigarettes and e-cigarettes. If you need help quitting, ask your health care provider.  Do not use drugs. Contact a health care provider if:  Your fatigue does not get better.  You have a fever.  You suddenly lose or gain weight.  You have headaches.  You have trouble falling asleep or sleeping through the night.  You feel angry, guilty, anxious, or  sad.  You are unable to have a bowel movement (constipation).  Your skin is dry.  You have swelling in your legs or another part of your body. Get help right away if:  You feel confused.  Your vision is blurry.  You feel faint or you pass out.  You have a severe headache.  You have severe pain in your abdomen, your back, or the area between your waist and hips (pelvis).  You have chest pain, shortness of breath, or an irregular or fast heartbeat.  You are unable to urinate, or you urinate less than normal.  You have abnormal bleeding, such as bleeding from the rectum, vagina, nose, lungs, or nipples.  You vomit blood.  You have thoughts about hurting yourself or others. If you ever feel like you may hurt yourself or others, or have thoughts about taking your own life, get help right away. You can go to your nearest emergency department or call:  Your local emergency services (911 in the [place]  A suicide crisis helpline, such as the [place] at 9164775271. This is open 24 hours a day. Summary  If you have fatigue, you feel tired all the time and have a lack of energy or a lack of motivation.  Fatigue may make it difficult to start or complete tasks because of exhaustion.  Long-lasting (chronic) or extreme fatigue may be a symptom of a medical condition.  Exercise regularly, as told by your health care provider.  Change situations that cause you stress. Try to keep your  work and personal schedule in balance. This information is not intended to replace advice given to you by your health care provider. Make sure you discuss any questions you have with your health care provider. Document Revised: 10/19/2018 Document Reviewed: 12/23/2016 Elsevier Patient Education  [place]. Edema  Edema is when you have too much fluid in your body or under your skin. Edema may make your legs, feet, and ankles swell up. Swelling is also common in  looser tissues, like around your eyes. This is a common condition. It gets more common as you get older. There are many possible causes of edema. Eating too much salt (sodium) and being on your feet or sitting for a long time can cause edema in your legs, feet, and ankles. Hot weather may make edema worse. Edema is usually painless. Your skin may look swollen or shiny. Follow these instructions at home:  Keep the swollen body part raised (elevated) above the level of your heart when you are sitting or lying down.  Do not sit still or stand for a long time.  Do not wear tight clothes. Do not wear garters on your upper legs.  Exercise your legs. This can help the swelling go down.  Wear elastic bandages or support stockings as told by your doctor.  Eat a low-salt (low-sodium) diet to reduce fluid as told by your doctor.  Depending on the cause of your swelling, you may need to limit how much fluid you drink (fluid restriction).  Take over-the-counter and prescription medicines only as told by your doctor. Contact a doctor if:  Treatment is not working.  You have heart, liver, or kidney disease and have symptoms of edema.  You have sudden and unexplained weight gain. Get help right away if:  You have shortness of breath or chest pain.  You cannot breathe when you lie down.  You have pain, redness, or warmth in the swollen areas.  You have heart, liver, or kidney disease and get edema all of a sudden.  You have a fever and your symptoms get worse all of a sudden. Summary  Edema is when you have too much fluid in your body or under your skin.  Edema may make your legs, feet, and ankles swell up. Swelling is also common in looser tissues, like around your eyes.  Raise (elevate) the swollen body part above the level of your heart when you are sitting or lying down.  Follow your doctor's instructions about diet and how much fluid you can drink (fluid restriction). This  information is not intended to replace advice given to you by your health care provider. Make sure you discuss any questions you have with your health care provider. Document Revised: 04/02/2017 Document Reviewed: 04/17/2016 Elsevier Patient Education  2020 [employer].

## 2020-02-12 NOTE — Assessment & Plan Note (Signed)
Not well managed.  Patient is not looking well in clinic today.  Skin: Is gray.  Patient's nail bed is gray in color.  Peripheral refill less than 3 seconds, oxygen saturation 99%.  Labs completed-TSH, anemia panel.  Results pending. Provided education to patient with printed handouts given.

## 2020-02-12 NOTE — Assessment & Plan Note (Signed)
Bilateral lower extremity edema not well managed.  Patient stopped Lasix 20 mg daily.  Advised patient to continue Lasix 20 mg daily, elevate feet, compression socks as tolerated.  Follow-up with worsening or unresolved symptoms.

## 2020-02-13 LAB — ANEMIA PROFILE B
Basophils Absolute: 0.1 10*3/uL (ref 0.0–0.2)
Basos: 1 %
EOS (ABSOLUTE): 0.3 10*3/uL (ref 0.0–0.4)
Eos: 4 %
Ferritin: 158 ng/mL — ABNORMAL HIGH (ref 15–150)
Folate: >20 ng/mL (ref >3.0)
Hematocrit: 40.2 % (ref 34.0–46.6)
Hemoglobin: 13.7 g/dL (ref 11.1–15.9)
Immature Grans (Abs): 0 10*3/uL (ref 0.0–0.1)
Immature Granulocytes: 0 %
Iron Saturation: 41 % (ref 15–55)
Iron: 107 ug/dL (ref 27–139)
Lymphocytes Absolute: 2.6 10*3/uL (ref 0.7–3.1)
Lymphs: 33 %
MCH: 30.5 pg (ref 26.6–33.0)
MCHC: 34.1 g/dL (ref 31.5–35.7)
MCV: 90 fL (ref 79–97)
Monocytes Absolute: 0.7 10*3/uL (ref 0.1–0.9)
Monocytes: 9 %
Neutrophils Absolute: 4.2 10*3/uL (ref 1.4–7.0)
Neutrophils: 53 %
Platelets: 182 10*3/uL (ref 150–450)
RBC: 4.49 x10E6/uL (ref 3.77–5.28)
RDW: 12.5 % (ref 11.7–15.4)
Retic Ct Pct: 1.2 % (ref 0.6–2.6)
Total Iron Binding Capacity: 263 ug/dL (ref 250–450)
UIBC: 156 ug/dL (ref 118–369)
Vitamin B-12: 952 pg/mL (ref 232–1245)
WBC: 7.8 10*3/uL (ref 3.4–10.8)

## 2020-02-13 LAB — COMPREHENSIVE METABOLIC PANEL
ALT: 15 IU/L (ref 0–32)
AST: 21 IU/L (ref 0–40)
Albumin/Globulin Ratio: 1.6 (ref 1.2–2.2)
Albumin: 4.2 g/dL (ref 3.6–4.6)
Alkaline Phosphatase: 76 IU/L (ref 44–121)
BUN/Creatinine Ratio: 13 (ref 12–28)
BUN: 11 mg/dL (ref 8–27)
Bilirubin Total: 0.3 mg/dL (ref 0.0–1.2)
CO2: 27 mmol/L (ref 20–29)
Calcium: 9.8 mg/dL (ref 8.7–10.3)
Chloride: 102 mmol/L (ref 96–106)
Creatinine, Ser: 0.85 mg/dL (ref 0.57–1.00)
GFR calc Af Amer: 74 mL/min/{1.73_m2} (ref >59)
GFR calc non Af Amer: 64 mL/min/{1.73_m2} (ref >59)
Globulin, Total: 2.7 g/dL (ref 1.5–4.5)
Glucose: 92 mg/dL (ref 65–99)
Potassium: 5 mmol/L (ref 3.5–5.2)
Sodium: 140 mmol/L (ref 134–144)
Total Protein: 6.9 g/dL (ref 6.0–8.5)

## 2020-02-13 LAB — TSH: TSH: 2.84 u[IU]/mL (ref 0.450–4.500)

## 2020-02-18 NOTE — Progress Notes (Signed)
Date:  02/20/2020   ID:  [name], DOB 22-Apr-1935, MRN [id number]  PCP:  [name], DO  Cardiologist:  Dr. Ala Bent: Dr. Stephannie Peters   Chief Complaint: BP follow up  History of Present Illness:   [name] is a 84 y.o. female with a history of HTN, HLD, fibromyalgia, GERD and severe ASs/p TAVR (10/19/17) who presents to clinic for  follow up.TAVR 10/19/17 with 20 mm Sapien no CAD at cath post implant mean gradient 12 mmHg despite Small size of valve. Moderate PVL BP improved with cozaar She called office 05/04/19 with mailaise, fatigue and poor appetite Not thought to be related to heart.   TTE 03/07/19 EF 60-65%  Trace MR TAVR 20 mm Sapien mean gradient 19 mmhg  Peak 36 mmHg moderate PVL  Labs 05/08/19 TSH normal  K 5.3 Cr 0.94 Hb 13.4   Seen by primary 05/08/19 for dizziness going on for couple of months Room spinning Not postural no frank syncope  She was given antivert ECG in office was normal with no arrhythmia reviewed   Cardiac CTA 06/27/19 reviewed HALT but no HAM no thrombus Small gap outside the right and non coronary cusp likely responsible for PvL   Seen by primary with more edema 02/12/20 She does not like taking her lasix regularly due to dizziness Chronic fatigue TSH and Hct normal 02/12/20   Discussed stopping norvasc to make sure not contributing to edema Start ARB for BP  Past Medical History:  Diagnosis Date  . Chronic headaches   . Fibromyalgia   . GERD (gastroesophageal reflux disease)   . Hemorrhoids    INTERNAL--  POST BANDING 02/ 2015  . History of kidney stones   . Hyperlipidemia   . Hypertension   . Moderate aortic stenosis   . OSA (obstructive sleep apnea) CPAP INTOLERANT   . Osteoporosis   . S/P TAVR (transcatheter aortic valve replacement) 10/19/2017   20 mm Edwards Sapien 3 transcatheter heart valve placed via percutaneous right transfemoral approach   . Severe aortic stenosis   . Spinal stenosis, multilevel     Past  Surgical History:  Procedure Laterality Date  . ANTERIOR CERVICAL DECOMP/DISCECTOMY FUSION  11-11-1999   C5 - C6  . APPENDECTOMY  AGE 4  . CARDIAC CATHETERIZATION  2008  DR [facility]   ESSENTIALLY NORMAL  . CATARACT EXTRACTION Bilateral   . CATARACT EXTRACTION W/ INTRAOCULAR LENS  IMPLANT, BILATERAL  2012  . EYE SURGERY    . FLEXIBLE SIGMOIDOSCOPY N/A 06/01/2013   Procedure: FLEXIBLE SIGMOIDOSCOPY;  Surgeon: Inda Castle, MD;  Location: [facility];  Service: Endoscopy;  Laterality: N/A;  may need hemorrhoidal banding  . HEMORRHOIDECTOMY WITH HEMORRHOID BANDING  08-07-2010  . IR RADIOLOGY PERIPHERAL GUIDED IV START  06/27/2019  . IR US GUIDE VASC ACCESS RIGHT  06/27/2019  . LAPAROSCOPIC CHOLECYSTECTOMY  1990  . RIGHT/LEFT HEART CATH AND CORONARY ANGIOGRAPHY N/A 09/29/2017   Procedure: RIGHT/LEFT HEART CATH AND CORONARY ANGIOGRAPHY;  Surgeon: Sherren Mocha, MD;  Location: [facility];  Service: Cardiovascular;  Laterality: N/A;  . SHOULDER ARTHROSCOPY WITH OPEN ROTATOR CUFF REPAIR AND DISTAL CLAVICLE ACROMINECTOMY Right 05/25/2012   Procedure: SHOULDER ARTHROSCOPY WITH OPEN ROTATOR CUFF REPAIR AND DISTAL CLAVICLE ACROMINECTOMY;  Surgeon: Magnus Sinning, MD;  Location: [place];  Service: Orthopedics;  Laterality: Right;  RIGHT SHOULDER ARTHROSCOPY WITH DERBRIDEMENTOF LABRAL/BICEP TENDON, OPEN DISTAL CLAVICLE RESECTION, ANTERIOR ACROMINECTOMY ROTATOR CUFF REPAIR ANESTHESIA: GENERAL/SCALENE NERVE  BLOCK  . TEE WITHOUT CARDIOVERSION Bilateral 10/19/2017   Procedure: TRANSESOPHAGEAL ECHOCARDIOGRAM (TEE);  Surgeon: Sherren Mocha, MD;  Location: [place];  Service: Open Heart Surgery;  Laterality: Bilateral;  . TENOSYNOVECTOMY Left 01/04/2014   Procedure: LEFT WRIST EXTENSOR TENOSYNOVECTOMY;  Surgeon: Linna Hoff, MD;  Location: [facility];  Service: Orthopedics;  Laterality: Left;  . THYROIDECTOMY  AGE 60   GOITER  . TONSILLECTOMY AND ADENOIDECTOMY  AGE  3  . TRANSCATHETER AORTIC VALVE REPLACEMENT, TRANSFEMORAL  10/19/2017  . TRANSCATHETER AORTIC VALVE REPLACEMENT, TRANSFEMORAL Bilateral 10/19/2017   Procedure: TRANSCATHETER AORTIC VALVE REPLACEMENT, TRANSFEMORAL;  Surgeon: Sherren Mocha, MD;  Location: [place];  Service: Open Heart Surgery;  Laterality: Bilateral;  . TRANSTHORACIC ECHOCARDIOGRAM  last one 04-20-2013  DR [facility]    NORMAL LVSF/ EF 60-65%/ MODERATE  AV  STENOSIS WITH NO AR /  MILD LAE  . UMBILICAL HERNIA REPAIR  JUNE 2006  . VAGINAL HYSTERECTOMY  01-31-2001   ANTERIOR & POSTERIOR REPAIR/ TRANSVAGINAL BLADDER SLING    Current Medications: Prior to Admission medications   Medication Sig Start Date End Date Taking? Authorizing Provider  acetaminophen (TYLENOL) 500 MG tablet Take 1,000 mg by mouth daily as needed for moderate pain or headache.    [provider]  ALOE VERA PO Take 1 capsule by mouth daily.    [provider]  aspirin EC 81 MG tablet Take 1 tablet (81 mg total) by mouth daily. 01/12/18   [name], DO  BEE POLLEN PO Take 400 mg by mouth daily.     [provider]  clobetasol cream (TEMOVATE) 3.66 % Apply 1 application topically 2 (two) times daily as needed. Patient taking differently: Apply 1 application topically 2 (two) times daily as needed (for rash).  03/19/17   [name], MD  clopidogrel (PLAVIX) 75 MG tablet Take 1 tablet (75 mg total) by mouth daily with breakfast. 01/12/18   [name], DO  Coenzyme Q10 (COQ-10) 100 MG CAPS Take 100 mg by mouth daily.    [provider]  diclofenac sodium (VOLTAREN) 1 % GEL APPLY 3 GRAMS TO 3 LARGE JOINTS UP TO 3 TIMES DIALY 12/14/17   Bo Merino, MD  losartan (COZAAR) 25 MG tablet Take 1 tablet (25 mg total) by mouth daily. 01/17/18   [name], MD  metoprolol tartrate (LOPRESSOR) 50 MG tablet Take 1 tablet (50 mg total) by mouth 2 (two) times daily. 01/17/18   [name], MD  OVER THE COUNTER  MEDICATION Take 1 capsule by mouth 2 (two) times daily. Hemp Oil    [provider]  polyethylene glycol (MIRALAX / GLYCOLAX) packet Take 17 g by mouth daily as needed for moderate constipation.    [provider]  Polyethylene Glycol 400 (BLINK TEARS) 0.25 % SOLN Place 1-2 drops into both eyes 3 (three) times daily.     [provider]  rosuvastatin (CRESTOR) 5 MG tablet Take 1 tablet (5 mg total) by mouth daily. 01/12/18   [name], DO  TURMERIC PO Take 1 capsule by mouth daily.    [provider]  vitamin B-12 (CYANOCOBALAMIN) 1000 MCG tablet Take 1,000 mcg by mouth daily.    [provider]    Allergies:   Tape and Prolia [denosumab]   Social History   Socioeconomic History  . Marital status: Married    Spouse name: Shanon Brow  . Number of children: 6  . Years of education: 6  .  Highest education level: 6th grade  Occupational History  . Occupation: Retired  Tobacco Use  . Smoking status: Former Smoker    Packs/day: 0.25    Years: 5.00    Pack years: 1.25    Types: Cigarettes    Quit date: 04/14/2003    Years since quitting: 16.8  . Smokeless tobacco: Never Used  Vaping Use  . Vaping Use: Never used  Substance and Sexual Activity  . Alcohol use: Yes    Comment: once in a while-social  . Drug use: Never    Comment: hemp oil   . Sexual activity: Not Currently  Other Topics Concern  . Not on file  Social History Narrative  . Not on file   Social Determinants of Health   Financial Resource Strain:   . Difficulty of Paying Living Expenses: Not on file  Food Insecurity:   . Worried About Charity fundraiser in the Last Year: Not on file  . Ran Out of Food in the Last Year: Not on file  Transportation Needs:   . Lack of Transportation (Medical): Not on file  . Lack of Transportation (Non-Medical): Not on file  Physical Activity:   . Days of Exercise per Week: Not on file  . Minutes of Exercise per Session: Not on file   Stress:   . Feeling of Stress : Not on file  Social Connections:   . Frequency of Communication with Friends and Family: Not on file  . Frequency of Social Gatherings with Friends and Family: Not on file  . Attends Religious Services: Not on file  . Active Member of Clubs or Organizations: Not on file  . Attends Archivist Meetings: Not on file  . Marital Status: Not on file     Family History:  The patient's family history includes Cancer (age of onset: 70) in her son; Cirrhosis in her mother; Gallbladder disease in her maternal grandmother; Heart disease in her sister; Other in her brother; Stomach cancer in her paternal grandfather.   ROS:   Please see the history of present illness.    ROS All other systems reviewed and are negative.   PHYSICAL EXAM:   VS:  BP (!) 144/58   Pulse 73   Ht 5\' 2"  (1.575 m)   Wt 71.3 kg   SpO2 95%   BMI 28.75 kg/m     Affect appropriate Healthy:  appears stated age 75: normal Neck supple with no adenopathy JVP normal no bruits no thyromegaly Lungs clear with no wheezing and good diaphragmatic motion Heart:  S1/S2 SEM through TAVR valve AR murmur, no rub, gallop or click PMI normal Abdomen: benighn, BS positve, no tenderness, no AAA no bruit.  No HSM or HJR Distal pulses intact with no bruits No edema Neuro non-focal Skin warm and dry No muscular weakness     Wt Readings from Last 3 Encounters:  02/20/20 71.3 kg  02/12/20 72.1 kg  11/08/19 72.6 kg      Studies/Labs Reviewed:   EKG:   05/10/19  SR rate 82 normal   Recent Labs: 09/07/2019: BNP 41.6 02/12/2020: ALT 15; BUN 11; Creatinine, Ser 0.85; Hemoglobin 13.7; Platelets 182; Potassium 5.0; Sodium 140; TSH 2.840   Lipid Panel    Component Value Date/Time   CHOL 145 05/25/2017 0844   CHOL 219 (H) 09/07/2012 0935   TRIG 136 05/25/2017 0844   TRIG 180 (H) 02/17/2013 1007   TRIG 326 (H) 09/07/2012 0935   HDL 47 05/25/2017  0844   HDL 51 02/17/2013 1007   HDL  47 09/07/2012 0935   CHOLHDL 3.1 05/25/2017 0844   CHOLHDL 5.2 10/09/2006 0435   VLDL 58 (H) 10/09/2006 0435   LDLCALC 71 05/25/2017 0844   LDLCALC 89 02/17/2013 1007   LDLCALC 107 (H) 09/07/2012 0935    Additional studies/ records that were reviewed today include:    2D ECHO 11/25/17 ( 1 month s/p TAVR)  Study Conclusion - Left ventricle: The cavity size was normal. There was mild focal basal hypertrophy of the septum. Systolic function was normal. The estimated ejection fraction was in the range of 60% to 65%. Wall motion was normal; there were no regional wall motion abnormalities. Doppler parameters are consistent with abnormal left ventricular relaxation (grade 1 diastolic dysfunction). - Aortic valve: A TAVR bioprosthesis was present and functioning normally. There was mild perivalvular regurgitation. Peak velocity (S): 244 cm/s. Mean gradient (S): 12 mm Hg. - Mitral valve: Severely calcified annulus. - Left atrium: The atrium was mildly dilated Impressions - Compared to the prior study, there has been no significant interval change.   RIGHT/LEFT HEART CATH AND CORONARY ANGIOGRAPHY  09/29/17  Conclusion   1.  Known severe aortic stenosis by noninvasive assessment with heavily calcified and restricted aortic valve leaflets biplane fluoroscopy 2.  Heavy mitral annular calcium 3.  Widely patent coronary arteries with minor luminal irregularities 4.  Mildly elevated right heart pressures   Recommendation: Continued multidisciplinary heart team evaluation for treatment of severe aortic stenosis     ASSESSMENT & PLAN:    1. HTN -see below d/c norvasc add cozaar 25 mg daily   2. Severe AS s/p TVAR - suboptimal result with small 20 mm Sapien valve and moderate PVL mean gradient increasing form 12->19 mmHg Given age and mixed disease not much to do Echo in a year   3. Palpitations -  Reviewed Zio monitor done 02/2018 no significant arrhythmia  continue beta blocker ECG normal   4. Vertigo:  Meclizine as needed f/u primary   5. Fatigue/Mailaise:  Non cardiac EF is normal 60-65% ECG normal no arrhythmia or heart block labs seem ok   6. HLD:  Continue statin labs with primary   7. Edema:  Dependant d/c norvasc encouraged her to take lasix daily as prescribed Add Cozaar for BP   Medication Adjustments/Labs and Tests Ordered: Current medicines are reviewed at length with the patient today.  Concerns regarding medicines are outlined above.  Medication changes, Labs and Tests ordered today are listed in the Patient Instructions below. There are no Patient Instructions on file for this visit.   F/U with me in 6 months     Signed, Jenkins Rouge, MD  02/20/2020 11:05 AM    [address], [place], [place]  [address] Phone: [phone]; Fax: [phone]

## 2020-02-20 ENCOUNTER — Other Ambulatory Visit: Payer: Self-pay

## 2020-02-20 ENCOUNTER — Encounter: Payer: Self-pay | Admitting: Cardiovascular Disease

## 2020-02-20 ENCOUNTER — Ambulatory Visit (HOSPITAL_COMMUNITY): Payer: Medicare Other | Attending: Cardiology

## 2020-02-20 ENCOUNTER — Ambulatory Visit (INDEPENDENT_AMBULATORY_CARE_PROVIDER_SITE_OTHER): Payer: Medicare Other | Admitting: Cardiovascular Disease

## 2020-02-20 VITALS — BP 144/58 | HR 73 | Ht 62.0 in | Wt 157.2 lb

## 2020-02-20 DIAGNOSIS — I351 Nonrheumatic aortic (valve) insufficiency: Secondary | ICD-10-CM

## 2020-02-20 DIAGNOSIS — R609 Edema, unspecified: Secondary | ICD-10-CM

## 2020-02-20 DIAGNOSIS — Z952 Presence of prosthetic heart valve: Secondary | ICD-10-CM | POA: Diagnosis not present

## 2020-02-20 DIAGNOSIS — I10 Essential (primary) hypertension: Secondary | ICD-10-CM | POA: Diagnosis not present

## 2020-02-20 LAB — ECHOCARDIOGRAM COMPLETE
AR max vel: 1.57 cm2
AV Area VTI: 1.74 cm2
AV Area mean vel: 1.6 cm2
AV Mean grad: 16 mmHg
AV Peak grad: 27.6 mmHg
Ao pk vel: 2.63 m/s
Area-P 1/2: 2.11 cm2
P 1/2 time: 269 msec
S' Lateral: 2.6 cm

## 2020-02-20 MED ORDER — LOSARTAN POTASSIUM 25 MG PO TABS: 25.0000 mg | ORAL_TABLET | Freq: Every day | ORAL | 3 refills | Status: DC

## 2020-02-20 NOTE — Patient Instructions (Signed)
Medication Instructions:   Your physician has recommended you make the following change in your medication:  1-STOP amlodipine 2-START Losartan 25 mg by mouth daily.  *If you need a refill on your cardiac medications before your next appointment, please call your pharmacy*   Lab Work: If you have labs (blood work) drawn today and your tests are completely normal, you will receive your results only by: Marland Kitchen MyChart Message (if you have MyChart) OR . A paper copy in the mail If you have any lab test that is abnormal or we need to change your treatment, we will call you to review the results.   Follow-Up: At [facility], you and your health needs are our priority.  As part of our continuing mission to provide you with exceptional heart care, we have created designated Provider Care Teams.  These Care Teams include your primary Cardiologist (physician) and Advanced Practice Providers (APPs -  Physician Assistants and Nurse Practitioners) who all work together to provide you with the care you need, when you need it.  We recommend signing up for the patient portal called "MyChart".  Sign up information is provided on this After Visit Summary.  MyChart is used to connect with patients for Virtual Visits (Telemedicine).  Patients are able to view lab/test results, encounter notes, upcoming appointments, etc.  Non-urgent messages can be sent to your provider as well.   To learn more about what you can do with MyChart, go to [email or web address].    Your next appointment:   6 month(s)  The format for your next appointment:   In Person  Provider:   You may see Jenkins Rouge, MD or one of the following Advanced Practice Providers on your designated Care Team:    Truitt Merle, NP  Cecilie Kicks, NP  Kathyrn Drown, NP

## 2020-03-09 ENCOUNTER — Other Ambulatory Visit: Payer: Self-pay | Admitting: Family Medicine

## 2020-04-03 ENCOUNTER — Other Ambulatory Visit: Payer: Self-pay | Admitting: Family Medicine

## 2020-05-09 ENCOUNTER — Other Ambulatory Visit: Payer: Self-pay | Admitting: Cardiovascular Disease

## 2020-05-29 ENCOUNTER — Other Ambulatory Visit: Payer: Self-pay | Admitting: Family Medicine

## 2020-05-29 ENCOUNTER — Other Ambulatory Visit: Payer: Self-pay | Admitting: Cardiovascular Disease

## 2020-07-15 ENCOUNTER — Other Ambulatory Visit: Payer: Self-pay

## 2020-07-15 ENCOUNTER — Encounter: Payer: Self-pay | Admitting: Family Medicine

## 2020-07-15 ENCOUNTER — Ambulatory Visit (INDEPENDENT_AMBULATORY_CARE_PROVIDER_SITE_OTHER): Payer: Medicare Other | Admitting: Family Medicine

## 2020-07-15 VITALS — BP 155/38 | HR 58 | Temp 97.2°F | Ht 62.0 in | Wt 154.8 lb

## 2020-07-15 DIAGNOSIS — R140 Abdominal distension (gaseous): Secondary | ICD-10-CM | POA: Diagnosis not present

## 2020-07-15 DIAGNOSIS — R1013 Epigastric pain: Secondary | ICD-10-CM | POA: Diagnosis not present

## 2020-07-15 DIAGNOSIS — Z952 Presence of prosthetic heart valve: Secondary | ICD-10-CM | POA: Diagnosis not present

## 2020-07-15 DIAGNOSIS — R230 Cyanosis: Secondary | ICD-10-CM | POA: Diagnosis not present

## 2020-07-15 MED ORDER — ROSUVASTATIN CALCIUM 5 MG PO TABS: 5.0000 mg | ORAL_TABLET | Freq: Every day | ORAL | 3 refills | Status: DC

## 2020-07-15 NOTE — Progress Notes (Signed)
Subjective: CC: Right lung pain PCP: [name], DO HPI:[name] is a 85 y.o. female presenting to clinic today for:  1.  Right lung pain Patient reports that she has discomfort in the right side.  She points to her mid back on the right.  Denies any hemoptysis, shortness of breath.  Heat and over-the-counter creams seem to help this.  No preceding injury.  She has a surgically absent gallbladder.  2.  Hyperlipidemia Patient needs refills on her Crestor.   ROS: Per HPI  Allergies  Allergen Reactions  . Tape Other (See Comments)    Dermatitis rash "with extended exposure"  . Prolia [Denosumab] Other (See Comments)    Arthralgia/ myalgia/ jaw pain/ headache   Past Medical History:  Diagnosis Date  . Chronic headaches   . Fibromyalgia   . GERD (gastroesophageal reflux disease)   . Hemorrhoids    INTERNAL--  POST BANDING 02/ 2015  . History of kidney stones   . Hyperlipidemia   . Hypertension   . Moderate aortic stenosis   . OSA (obstructive sleep apnea) CPAP INTOLERANT   . Osteoporosis   . S/P TAVR (transcatheter aortic valve replacement) 10/19/2017   20 mm Edwards Sapien 3 transcatheter heart valve placed via percutaneous right transfemoral approach   . Severe aortic stenosis   . Spinal stenosis, multilevel     Current Outpatient Medications:  .  acetaminophen (TYLENOL) 500 MG tablet, Take 1,000 mg by mouth daily as needed for moderate pain or headache., Disp: , Rfl:  .  ALOE VERA PO, Take 1 capsule by mouth daily., Disp: , Rfl:  .  Ascorbic Acid (VITAMIN C) 1000 MG tablet, Take 1,000 mg by mouth daily., Disp: , Rfl:  .  aspirin EC 81 MG tablet, Take 1 tablet (81 mg total) by mouth daily., Disp: 90 tablet, Rfl: 3 .  BEE POLLEN PO, Take 400 mg by mouth daily. , Disp: , Rfl:  .  clobetasol cream (TEMOVATE) 3.54 %, APPLY 1 APPLICATION TOPICALLY 2 (TWO) TIMES DAILY AS NEEDED., Disp: 30 g, Rfl: 3 .  Coenzyme Q10 (COQ-10) 100 MG CAPS, Take 100 mg by mouth  daily., Disp: , Rfl:  .  furosemide (LASIX) 20 MG tablet, Take 20 mg by mouth daily as needed., Disp: , Rfl:  .  losartan (COZAAR) 25 MG tablet, Take 1 tablet (25 mg total) by mouth daily., Disp: 90 tablet, Rfl: 3 .  losartan (COZAAR) 50 MG tablet, TAKE 1 TABLET BY MOUTH EVERY DAY, Disp: 90 tablet, Rfl: 3 .  metoprolol tartrate (LOPRESSOR) 50 MG tablet, TAKE 1 TABLET BY MOUTH TWICE A DAY, Disp: 180 tablet, Rfl: 3 .  Misc Natural Products (TART CHERRY ADVANCED PO), Take by mouth daily., Disp: , Rfl:  .  mupirocin ointment (BACTROBAN) 2 %, Apply to the affected area twice daily for 7-10 days, Disp: 22 g, Rfl: 0 .  OVER THE COUNTER MEDICATION, Take 1 capsule by mouth 2 (two) times daily. Hemp Oil, Disp: , Rfl:  .  prednisoLONE acetate (PRED FORTE) 1 % ophthalmic suspension, Place 1 drop into the right eye 4 (four) times daily. (Patient taking differently: Place 1 drop into the right eye 2 (two) times a day. ), Disp: 10 mL, Rfl: 0 .  rosuvastatin (CRESTOR) 5 MG tablet, Take 1 tablet (5 mg total) by mouth daily. (Needs to be seen before next refill), Disp: 30 tablet, Rfl: 0 .  UNABLE TO FIND, Med Name: HEMP- Tumeric cream / RUB, Disp: ,  Rfl:  Social History   Socioeconomic History  . Marital status: Married    Spouse name: Shanon Brow  . Number of children: 6  . Years of education: 6  . Highest education level: 6th grade  Occupational History  . Occupation: Retired  Tobacco Use  . Smoking status: Former Smoker    Packs/day: 0.25    Years: 5.00    Pack years: 1.25    Types: Cigarettes    Quit date: 04/14/2003    Years since quitting: 17.2  . Smokeless tobacco: Never Used  Vaping Use  . Vaping Use: Never used  Substance and Sexual Activity  . Alcohol use: Yes    Comment: once in a while-social  . Drug use: Never    Comment: hemp oil   . Sexual activity: Not Currently  Other Topics Concern  . Not on file  Social History Narrative  . Not on file   Social Determinants of Health   Financial  Resource Strain: Not on file  Food Insecurity: Not on file  Transportation Needs: Not on file  Physical Activity: Not on file  Stress: Not on file  Social Connections: Not on file  Intimate Partner Violence: Not on file   Family History  Problem Relation Age of Onset  . Cirrhosis Mother        drinking  . Other Brother        duodenal ulcer  . Heart disease Sister   . Cancer Son 106       colon  . Gallbladder disease Maternal Grandmother   . Stomach cancer Paternal Grandfather     Objective: Office vital signs reviewed. BP (!) 155/38   Pulse (!) 58   Temp (!) 97.2 F (36.2 C) (Temporal)   Ht 5\' 2"  (1.575 m)   Wt 154 lb 12.8 oz (70.2 kg)   SpO2 99%   BMI 28.31 kg/m   Physical Examination:  General: Awake, alert, blue hue to skin, No acute distress HEENT: MMM; EOMI Cardio: regular rate and rhythm, S1S2 heard, no murmurs appreciated Pulm: clear to auscultation bilaterally, no wheezes, rhonchi or rales; normal work of breathing on room air GI: Soft, nondistended.  She has epigastric tenderness palpation present.  No guarding or rebound. Extremities: warm, well perfused, No edema, cyanosis or clubbing; +2 pulses bilaterally Skin: Silver blue hue appreciated in the face and along the nailbeds of the hands bilaterally  Assessment/ Plan: 85 y.o. female   Blue color skin  S/P TAVR (transcatheter aortic valve replacement)  Epigastric abdominal pain - Plan: Ambulatory referral to Gastroenterology  Abdominal bloating - Plan: Ambulatory referral to Gastroenterology  I suspect that the blue color of her skin and nails are related to that her use of colloidal silver.  I did reach out to her cardiologist, [name], given low diastolic blood pressure and this was felt to be secondary to her leaky TAVR.  No medication changes were recommended.  She had normal oxygenation on room air.  She was not dizzy or symptomatic otherwise.  Her epigastric pain is concerning for gastritis.  I  offered a PPI but apparently this caused some issues in the past and she preferred to see gastroenterology.  The referral has been placed.  No orders of the defined types were placed in this encounter.  No orders of the defined types were placed in this encounter.    [name], DO [place] [phone]

## 2020-08-23 NOTE — Progress Notes (Signed)
Date:  08/30/2020   ID:  [name], DOB June 18, 1935, MRN [id number]  PCP:  [name], DO  Cardiologist:  Dr. Ala Bent: Dr. Stephannie Peters   Chief Complaint: BP follow up  History of Present Illness:   85 y.o. f/u for TAVR done 10/19/17 with 20 mm Sapien valve stable moderate PVL by last TTE 02/20/20 mean gradient 16 mmHg. Cath 09/29/17 prior to TAVR with no CAD   Seen by primary 05/08/19 for vetigo Rx meclizine  Cardiac CTA 06/27/19 reviewed HALT but no HAM no thrombus Small gap outside the right and non coronary cusp likely responsible for PvL   LE edema better off Norvasc and taking Lasix   Seen by primary with some gatritis 07/15/20 and referred to GI  She has taken colloidal silver for 15 years to boost her immune system This has resulted in some discoloration of her skin/nails   No dyspnea, CHF, palpitations or syncope   Past Medical History:  Diagnosis Date  . Chronic headaches   . Fibromyalgia   . GERD (gastroesophageal reflux disease)   . Hemorrhoids    INTERNAL--  POST BANDING 02/ 2015  . History of kidney stones   . Hyperlipidemia   . Hypertension   . Moderate aortic stenosis   . OSA (obstructive sleep apnea) CPAP INTOLERANT   . Osteoporosis   . S/P TAVR (transcatheter aortic valve replacement) 10/19/2017   20 mm Edwards Sapien 3 transcatheter heart valve placed via percutaneous right transfemoral approach   . Severe aortic stenosis   . Spinal stenosis, multilevel     Past Surgical History:  Procedure Laterality Date  . ANTERIOR CERVICAL DECOMP/DISCECTOMY FUSION  11-11-1999   C5 - C6  . APPENDECTOMY  AGE 26  . CARDIAC CATHETERIZATION  2008  DR [facility]   ESSENTIALLY NORMAL  . CATARACT EXTRACTION Bilateral   . CATARACT EXTRACTION W/ INTRAOCULAR LENS  IMPLANT, BILATERAL  2012  . EYE SURGERY    . FLEXIBLE SIGMOIDOSCOPY N/A 06/01/2013   Procedure: FLEXIBLE SIGMOIDOSCOPY;  Surgeon: Inda Castle, MD;  Location: [facility];  Service:  Endoscopy;  Laterality: N/A;  may need hemorrhoidal banding  . HEMORRHOIDECTOMY WITH HEMORRHOID BANDING  08-07-2010  . IR RADIOLOGY PERIPHERAL GUIDED IV START  06/27/2019  . IR US GUIDE VASC ACCESS RIGHT  06/27/2019  . LAPAROSCOPIC CHOLECYSTECTOMY  1990  . RIGHT/LEFT HEART CATH AND CORONARY ANGIOGRAPHY N/A 09/29/2017   Procedure: RIGHT/LEFT HEART CATH AND CORONARY ANGIOGRAPHY;  Surgeon: Sherren Mocha, MD;  Location: [place] [facility];  Service: Cardiovascular;  Laterality: N/A;  . SHOULDER ARTHROSCOPY WITH OPEN ROTATOR CUFF REPAIR AND DISTAL CLAVICLE ACROMINECTOMY Right 05/25/2012   Procedure: SHOULDER ARTHROSCOPY WITH OPEN ROTATOR CUFF REPAIR AND DISTAL CLAVICLE ACROMINECTOMY;  Surgeon: Magnus Sinning, MD;  Location: [place];  Service: Orthopedics;  Laterality: Right;  RIGHT SHOULDER ARTHROSCOPY WITH DERBRIDEMENTOF LABRAL/BICEP TENDON, OPEN DISTAL CLAVICLE RESECTION, ANTERIOR ACROMINECTOMY ROTATOR CUFF REPAIR ANESTHESIA: GENERAL/SCALENE NERVE BLOCK  . TEE WITHOUT CARDIOVERSION Bilateral 10/19/2017   Procedure: TRANSESOPHAGEAL ECHOCARDIOGRAM (TEE);  Surgeon: Sherren Mocha, MD;  Location: [place];  Service: Open Heart Surgery;  Laterality: Bilateral;  . TENOSYNOVECTOMY Left 01/04/2014   Procedure: LEFT WRIST EXTENSOR TENOSYNOVECTOMY;  Surgeon: Linna Hoff, MD;  Location: [facility];  Service: Orthopedics;  Laterality: Left;  . THYROIDECTOMY  AGE 55   GOITER  . TONSILLECTOMY AND ADENOIDECTOMY  AGE 25  . TRANSCATHETER AORTIC VALVE REPLACEMENT, TRANSFEMORAL  10/19/2017  . TRANSCATHETER  AORTIC VALVE REPLACEMENT, TRANSFEMORAL Bilateral 10/19/2017   Procedure: TRANSCATHETER AORTIC VALVE REPLACEMENT, TRANSFEMORAL;  Surgeon: Sherren Mocha, MD;  Location: [place];  Service: Open Heart Surgery;  Laterality: Bilateral;  . TRANSTHORACIC ECHOCARDIOGRAM  last one 04-20-2013  DR [facility]    NORMAL LVSF/ EF 60-65%/ MODERATE  AV  STENOSIS WITH NO AR /  MILD LAE  . UMBILICAL  HERNIA REPAIR  JUNE 2006  . VAGINAL HYSTERECTOMY  01-31-2001   ANTERIOR & POSTERIOR REPAIR/ TRANSVAGINAL BLADDER SLING    Current Medications: Prior to Admission medications   Medication Sig Start Date End Date Taking? Authorizing Provider  acetaminophen (TYLENOL) 500 MG tablet Take 1,000 mg by mouth daily as needed for moderate pain or headache.    [provider]  ALOE VERA PO Take 1 capsule by mouth daily.    [provider]  aspirin EC 81 MG tablet Take 1 tablet (81 mg total) by mouth daily. 01/12/18   [name], DO  BEE POLLEN PO Take 400 mg by mouth daily.     [provider]  clobetasol cream (TEMOVATE) 8.34 % Apply 1 application topically 2 (two) times daily as needed. Patient taking differently: Apply 1 application topically 2 (two) times daily as needed (for rash).  03/19/17   [name], MD  clopidogrel (PLAVIX) 75 MG tablet Take 1 tablet (75 mg total) by mouth daily with breakfast. 01/12/18   [name], DO  Coenzyme Q10 (COQ-10) 100 MG CAPS Take 100 mg by mouth daily.    [provider]  diclofenac sodium (VOLTAREN) 1 % GEL APPLY 3 GRAMS TO 3 LARGE JOINTS UP TO 3 TIMES DIALY 12/14/17   Bo Merino, MD  losartan (COZAAR) 25 MG tablet Take 1 tablet (25 mg total) by mouth daily. 01/17/18   [name], MD  metoprolol tartrate (LOPRESSOR) 50 MG tablet Take 1 tablet (50 mg total) by mouth 2 (two) times daily. 01/17/18   [name], MD  OVER THE COUNTER MEDICATION Take 1 capsule by mouth 2 (two) times daily. Hemp Oil    [provider]  polyethylene glycol (MIRALAX / GLYCOLAX) packet Take 17 g by mouth daily as needed for moderate constipation.    [provider]  Polyethylene Glycol 400 (BLINK TEARS) 0.25 % SOLN Place 1-2 drops into both eyes 3 (three) times daily.     [provider]  rosuvastatin (CRESTOR) 5 MG tablet Take 1 tablet (5 mg total) by mouth daily. 01/12/18   [name],  DO  TURMERIC PO Take 1 capsule by mouth daily.    [provider]  vitamin B-12 (CYANOCOBALAMIN) 1000 MCG tablet Take 1,000 mcg by mouth daily.    [provider]    Allergies:   Tape and Prolia [denosumab]   Social History   Socioeconomic History  . Marital status: Married    Spouse name: Shanon Brow  . Number of children: 6  . Years of education: 6  . Highest education level: 6th grade  Occupational History  . Occupation: Retired  Tobacco Use  . Smoking status: Former Smoker    Packs/day: 0.25    Years: 5.00    Pack years: 1.25    Types: Cigarettes    Quit date: 04/14/2003    Years since quitting: 17.3  . Smokeless tobacco: Never Used  Vaping Use  . Vaping Use: Never used  Substance and Sexual Activity  . Alcohol use: Yes    Comment: once in a while-social  .  Drug use: Never    Comment: hemp oil   . Sexual activity: Not Currently  Other Topics Concern  . Not on file  Social History Narrative  . Not on file   Social Determinants of Health   Financial Resource Strain: Not on file  Food Insecurity: Not on file  Transportation Needs: Not on file  Physical Activity: Not on file  Stress: Not on file  Social Connections: Not on file     Family History:  The patient's family history includes Cancer (age of onset: 49) in her son; Cirrhosis in her mother; Gallbladder disease in her maternal grandmother; Heart disease in her sister; Other in her brother; Stomach cancer in her paternal grandfather.   ROS:   Please see the history of present illness.    ROS All other systems reviewed and are negative.   PHYSICAL EXAM:   VS:  BP (!) 150/52   Pulse (!) 58   Ht 5\' 2"  (1.575 m)   Wt 70.3 kg   SpO2 99%   BMI 28.35 kg/m     Affect appropriate Healthy:  appears stated age 70: normal Neck supple with no adenopathy JVP normal no bruits no thyromegaly Lungs clear with no wheezing and good diaphragmatic motion Heart:  S1/S2 SEM through TAVR valve AR  murmur, no rub, gallop or click PMI normal Abdomen: benighn, BS positve, no tenderness, no AAA no bruit.  No HSM or HJR Distal pulses intact with no bruits No edema Neuro non-focal Skin blue discoloration for colloidal silver exposure  No muscular weakness    Wt Readings from Last 3 Encounters:  08/30/20 70.3 kg  07/15/20 70.2 kg  02/20/20 71.3 kg      Studies/Labs Reviewed:   EKG:   08/30/2020   SR rate 58  normal   Recent Labs: 09/07/2019: BNP 41.6 02/12/2020: ALT 15; BUN 11; Creatinine, Ser 0.85; Hemoglobin 13.7; Platelets 182; Potassium 5.0; Sodium 140; TSH 2.840   Lipid Panel    Component Value Date/Time   CHOL 145 05/25/2017 0844   CHOL 219 (H) 09/07/2012 0935   TRIG 136 05/25/2017 0844   TRIG 180 (H) 02/17/2013 1007   TRIG 326 (H) 09/07/2012 0935   HDL 47 05/25/2017 0844   HDL 51 02/17/2013 1007   HDL 47 09/07/2012 0935   CHOLHDL 3.1 05/25/2017 0844   CHOLHDL 5.2 10/09/2006 0435   VLDL 58 (H) 10/09/2006 0435   LDLCALC 71 05/25/2017 0844   LDLCALC 89 02/17/2013 1007   LDLCALC 107 (H) 09/07/2012 0935    Additional studies/ records that were reviewed today include:    2D ECHO  02/20/20   Study Conclusions   - Left ventricle: The cavity size was normal. There was mild focal  basal hypertrophy of the septum. Systolic function was vigorous.  The estimated ejection fraction was in the range of 65% to 70%.  Wall motion was normal; there were no regional wall motion  abnormalities. Doppler parameters are consistent with abnormal  left ventricular relaxation (grade 1 diastolic dysfunction).  - Aortic valve: A new bioprosthesis was present and functioning  normally. The prosthesis had a normal range of motion. The device  appeared normal, had no rocking motion, and showed no evidence of  dehiscence. There was trivial regurgitation. Mean gradient (S):  12 mm Hg. Valve area (VTI): 1.95 cm^2. Indexed valve area (VTI):  1.08 cm^2/m^2. Valve area  (Vmax): 1.67 cm^2. Valve area (Vmean):  1.7 cm^2.  - Mitral valve: Calcified annulus.   Impressions:   -  Post-TAVR, the new bioprosthesis appears well seated without  significant stenosis. Very trivial AR. LV EF is normal.    RIGHT/LEFT HEART CATH AND CORONARY ANGIOGRAPHY  09/29/17  Conclusion   1.  Known severe aortic stenosis by noninvasive assessment with heavily calcified and restricted aortic valve leaflets biplane fluoroscopy 2.  Heavy mitral annular calcium 3.  Widely patent coronary arteries with minor luminal irregularities 4.  Mildly elevated right heart pressures   Recommendation: Continued multidisciplinary heart team evaluation for treatment of severe aortic stenosis     ASSESSMENT & PLAN:    1. HTN -improved with cozaar norvasc d/c   2. Severe AS s/p TVAR - suboptimal result with small 20 mm Sapien valve and moderate PVL TTE 02/20/20 with mean gradient stable 16 mmHg and DVI ok 0.55 She is asymptomatic Low diastolic pressure from AR She is not interested in added procedures To "plug" leaky valve   3. Palpitations -  Reviewed Zio monitor done 02/2018 no arrhythmia continue lopressor   4. Vertigo:  Meclizine as needed f/u primary   5. Fatigue/Mailaise:  Non cardiac EF is normal 60-65% ECG normal no arrhythmia or heart block labs seem ok   6. HLD:  Continue statin labs with primary   7. Edema:  Better off norvasc lasix as needed   8. [place]: bluish-gray skin discoloration Primary physicians keep suggesting Patient is hypoxic but likely from colloidal silver exposure    Medication Adjustments/Labs and Tests Ordered: Current medicines are reviewed at length with the patient today.  Concerns regarding medicines are outlined above.  Medication changes, Labs and Tests ordered today are listed in the Patient Instructions below. There are no Patient Instructions on file for this visit.   F/U with me in 6 months     Signed, Jenkins Rouge, MD  08/30/2020  9:50 AM    [facility] [address], [place], [place]  [address] Phone: [phone]; Fax: [phone]

## 2020-08-30 ENCOUNTER — Other Ambulatory Visit: Payer: Self-pay

## 2020-08-30 ENCOUNTER — Ambulatory Visit: Payer: Medicare Other | Admitting: Cardiovascular Disease

## 2020-08-30 ENCOUNTER — Encounter: Payer: Self-pay | Admitting: Cardiovascular Disease

## 2020-08-30 VITALS — BP 150/52 | HR 58 | Ht 62.0 in | Wt 155.0 lb

## 2020-08-30 DIAGNOSIS — I351 Nonrheumatic aortic (valve) insufficiency: Secondary | ICD-10-CM | POA: Diagnosis not present

## 2020-08-30 DIAGNOSIS — Z952 Presence of prosthetic heart valve: Secondary | ICD-10-CM

## 2020-08-30 DIAGNOSIS — I10 Essential (primary) hypertension: Secondary | ICD-10-CM | POA: Diagnosis not present

## 2020-08-30 DIAGNOSIS — R002 Palpitations: Secondary | ICD-10-CM | POA: Diagnosis not present

## 2020-08-30 NOTE — Patient Instructions (Signed)

## 2020-09-12 DIAGNOSIS — Z1231 Encounter for screening mammogram for malignant neoplasm of breast: Secondary | ICD-10-CM | POA: Diagnosis not present

## 2020-09-13 DIAGNOSIS — H4311 Vitreous hemorrhage, right eye: Secondary | ICD-10-CM | POA: Diagnosis not present

## 2020-09-13 DIAGNOSIS — H35373 Puckering of macula, bilateral: Secondary | ICD-10-CM | POA: Diagnosis not present

## 2020-09-13 DIAGNOSIS — H43813 Vitreous degeneration, bilateral: Secondary | ICD-10-CM | POA: Diagnosis not present

## 2020-10-11 ENCOUNTER — Other Ambulatory Visit: Payer: Self-pay | Admitting: Cardiovascular Disease

## 2020-10-11 DIAGNOSIS — H4311 Vitreous hemorrhage, right eye: Secondary | ICD-10-CM | POA: Diagnosis not present

## 2020-10-11 DIAGNOSIS — H35373 Puckering of macula, bilateral: Secondary | ICD-10-CM | POA: Diagnosis not present

## 2020-10-11 DIAGNOSIS — H43813 Vitreous degeneration, bilateral: Secondary | ICD-10-CM | POA: Diagnosis not present

## 2020-10-15 NOTE — Telephone Encounter (Signed)
Rx(s) sent to pharmacy electronically.  

## 2020-11-19 ENCOUNTER — Other Ambulatory Visit: Payer: Self-pay | Admitting: Cardiovascular Disease

## 2020-11-22 DIAGNOSIS — H1789 Other corneal scars and opacities: Secondary | ICD-10-CM | POA: Diagnosis not present

## 2020-11-22 DIAGNOSIS — H04123 Dry eye syndrome of bilateral lacrimal glands: Secondary | ICD-10-CM | POA: Diagnosis not present

## 2021-01-13 ENCOUNTER — Telehealth: Payer: Self-pay | Admitting: Family Medicine

## 2021-01-13 NOTE — Telephone Encounter (Signed)
Left message for patient to call back and schedule Medicare Annual Wellness Visit (AWV) to be completed by video or phone.   Last AWV: 12/08/2018  Please schedule at anytime with [facility] Health Advisor.  45 minute appointment  Any questions, please contact me at [phone]

## 2021-02-14 ENCOUNTER — Other Ambulatory Visit: Payer: Self-pay | Admitting: Cardiovascular Disease

## 2021-02-25 DIAGNOSIS — H1789 Other corneal scars and opacities: Secondary | ICD-10-CM | POA: Diagnosis not present

## 2021-02-25 DIAGNOSIS — H04123 Dry eye syndrome of bilateral lacrimal glands: Secondary | ICD-10-CM | POA: Diagnosis not present

## 2021-02-26 ENCOUNTER — Telehealth: Payer: Self-pay | Admitting: Family Medicine

## 2021-02-26 NOTE — Telephone Encounter (Signed)
Left message for patient to call back and schedule Medicare Annual Wellness Visit (AWV) to be completed by video or phone.   Last AWV: 12/08/2018  Please schedule at anytime with [facility] Health Advisor.  45 minute appointment  Any questions, please contact me at [phone]

## 2021-03-05 ENCOUNTER — Telehealth: Payer: Self-pay | Admitting: Family Medicine

## 2021-03-05 NOTE — Telephone Encounter (Signed)
Called to make AWV, no voicemail set up. Please make appt if pt calls back.

## 2021-05-08 ENCOUNTER — Ambulatory Visit (INDEPENDENT_AMBULATORY_CARE_PROVIDER_SITE_OTHER): Payer: Medicare Other | Admitting: Family Medicine

## 2021-05-08 ENCOUNTER — Emergency Department (HOSPITAL_COMMUNITY)
Admission: EM | Admit: 2021-05-08 | Discharge: 2021-05-08 | Disposition: A | Discharge status: Home / Self Care | Payer: Medicare Other | Attending: Emergency Medicine | Admitting: Emergency Medicine

## 2021-05-08 ENCOUNTER — Emergency Department (HOSPITAL_COMMUNITY): Payer: Medicare Other

## 2021-05-08 ENCOUNTER — Other Ambulatory Visit: Payer: Self-pay

## 2021-05-08 ENCOUNTER — Encounter: Payer: Self-pay | Admitting: Family Medicine

## 2021-05-08 ENCOUNTER — Encounter (HOSPITAL_COMMUNITY): Payer: Self-pay | Admitting: *Deleted

## 2021-05-08 VITALS — BP 218/67 | HR 62 | Ht 62.0 in | Wt 157.0 lb

## 2021-05-08 DIAGNOSIS — R079 Chest pain, unspecified: Secondary | ICD-10-CM

## 2021-05-08 DIAGNOSIS — I700 Atherosclerosis of aorta: Secondary | ICD-10-CM | POA: Diagnosis not present

## 2021-05-08 DIAGNOSIS — K859 Acute pancreatitis without necrosis or infection, unspecified: Secondary | ICD-10-CM | POA: Diagnosis not present

## 2021-05-08 DIAGNOSIS — I160 Hypertensive urgency: Secondary | ICD-10-CM

## 2021-05-08 DIAGNOSIS — Z7982 Long term (current) use of aspirin: Secondary | ICD-10-CM | POA: Insufficient documentation

## 2021-05-08 DIAGNOSIS — R109 Unspecified abdominal pain: Secondary | ICD-10-CM | POA: Diagnosis not present

## 2021-05-08 DIAGNOSIS — R5381 Other malaise: Secondary | ICD-10-CM

## 2021-05-08 DIAGNOSIS — R519 Headache, unspecified: Secondary | ICD-10-CM

## 2021-05-08 DIAGNOSIS — K5900 Constipation, unspecified: Secondary | ICD-10-CM | POA: Insufficient documentation

## 2021-05-08 DIAGNOSIS — R600 Localized edema: Secondary | ICD-10-CM

## 2021-05-08 DIAGNOSIS — Z79899 Other long term (current) drug therapy: Secondary | ICD-10-CM | POA: Diagnosis not present

## 2021-05-08 DIAGNOSIS — I10 Essential (primary) hypertension: Secondary | ICD-10-CM | POA: Diagnosis not present

## 2021-05-08 DIAGNOSIS — R231 Pallor: Secondary | ICD-10-CM

## 2021-05-08 DIAGNOSIS — R5383 Other fatigue: Secondary | ICD-10-CM | POA: Diagnosis not present

## 2021-05-08 DIAGNOSIS — Z952 Presence of prosthetic heart valve: Secondary | ICD-10-CM

## 2021-05-08 DIAGNOSIS — R9431 Abnormal electrocardiogram [ECG] [EKG]: Secondary | ICD-10-CM | POA: Diagnosis not present

## 2021-05-08 LAB — CBC WITH DIFFERENTIAL/PLATELET
Abs Immature Granulocytes: 0.03 10*3/uL (ref 0.00–0.07)
Basophils Absolute: 0.1 10*3/uL (ref 0.0–0.1)
Basophils Relative: 1 %
Eosinophils Absolute: 0.3 10*3/uL (ref 0.0–0.5)
Eosinophils Relative: 4 %
HCT: 36.8 % (ref 36.0–46.0)
Hemoglobin: 12 g/dL (ref 12.0–15.0)
Immature Granulocytes: 0 %
Lymphocytes Relative: 33 %
Lymphs Abs: 2.6 10*3/uL (ref 0.7–4.0)
MCH: 30.6 pg (ref 26.0–34.0)
MCHC: 32.6 g/dL (ref 30.0–36.0)
MCV: 93.9 fL (ref 80.0–100.0)
Monocytes Absolute: 0.7 10*3/uL (ref 0.1–1.0)
Monocytes Relative: 8 %
Neutro Abs: 4.3 10*3/uL (ref 1.7–7.7)
Neutrophils Relative %: 54 %
Platelets: 208 10*3/uL (ref 150–400)
RBC: 3.92 MIL/uL (ref 3.87–5.11)
RDW: 12.6 % (ref 11.5–15.5)
WBC: 8 10*3/uL (ref 4.0–10.5)
nRBC: 0 % (ref 0.0–0.2)

## 2021-05-08 LAB — COMPREHENSIVE METABOLIC PANEL
ALT: 19 U/L (ref 0–44)
AST: 18 U/L (ref 15–41)
Albumin: 3.7 g/dL (ref 3.5–5.0)
Alkaline Phosphatase: 54 U/L (ref 38–126)
Anion gap: 9 (ref 5–15)
BUN: 12 mg/dL (ref 8–23)
CO2: 26 mmol/L (ref 22–32)
Calcium: 9.6 mg/dL (ref 8.9–10.3)
Chloride: 104 mmol/L (ref 98–111)
Creatinine, Ser: 0.82 mg/dL (ref 0.44–1.00)
GFR, Estimated: >60 mL/min (ref >60)
Glucose, Bld: 101 mg/dL — ABNORMAL HIGH (ref 70–99)
Potassium: 4.7 mmol/L (ref 3.5–5.1)
Sodium: 139 mmol/L (ref 135–145)
Total Bilirubin: 0.4 mg/dL (ref 0.3–1.2)
Total Protein: 6.5 g/dL (ref 6.5–8.1)

## 2021-05-08 LAB — TROPONIN I (HIGH SENSITIVITY)
Troponin I (High Sensitivity): 8 ng/L (ref <18)
Troponin I (High Sensitivity): 9 ng/L (ref <18)

## 2021-05-08 LAB — LIPASE, BLOOD: Lipase: 102 U/L — ABNORMAL HIGH (ref 11–51)

## 2021-05-08 MED ORDER — METOPROLOL TARTRATE 25 MG PO TABS
50.0000 mg | ORAL_TABLET | Freq: Once | ORAL | Status: AC
  Administered 2021-05-08: 50 mg via ORAL
  Filled 2021-05-08: qty 2

## 2021-05-08 MED ORDER — IOHEXOL 300 MG/ML  SOLN
80.0000 mL | Freq: Once | INTRAMUSCULAR | Status: AC | PRN
  Administered 2021-05-08: 80 mL via INTRAVENOUS

## 2021-05-08 NOTE — ED Triage Notes (Signed)
Pt reports for two weeks she has been having right flank pain that radiates to the right side of her abdomen. "Pressure" in the upper right back. Also c/o constipation, left sided headache and ear pain. Went to her doctor today and her BP was elevated at 200 SBP, taking her medications as prescribed.

## 2021-05-08 NOTE — ED Provider Triage Note (Signed)
Emergency Medicine Provider Triage Evaluation Note  [name] , a 86 y.o. female  was evaluated in triage.  Pt complains of right flank pain x 2 weeks.  Has associated headache localized to the left side of the head, constipation.  Has not tried medication for symptoms.  Denies fever, chills, nausea, vomiting. She notes he has a history of kidney stones however each time she has been evaluated she is informed that they have passed.  She was seen her primary care provider office today and told to come into the ED due to elevated blood pressure.  She is taking her medications as prescribed.  Denies   Review of Systems  Positive: As per HPI above Negative: Fever, chills, nausea, vomiting  Physical Exam  BP (!) 201/40    Pulse 60    Temp 98.7 F (37.1 C) (Oral)    Resp (!) 22    Ht 5\' 2"  (1.575 m)    Wt 71.7 kg    SpO2 98%    BMI 28.90 kg/m  Gen:   Awake, no distress   Resp:  Normal effort  MSK:   Moves extremities without difficulty  Other:  No abdominal tenderness to palpation.  No CVA tenderness noted bilaterally.  Medical Decision Making  Medically screening exam initiated at 7:32 PM.  Appropriate orders placed.  ROSHUNDA KEIR was informed that the remainder of the evaluation will be completed by another provider, this initial triage assessment does not replace that evaluation, and the importance of remaining in the ED until their evaluation is complete.  7:42 PM - Discussed with RN that patient is in need of a room immediately. RN aware and working on room placement.    Leeon Makar A, PA-C 05/08/21 1954

## 2021-05-08 NOTE — Discharge Instructions (Signed)
Please call the gastroenterologist schedule follow-up appointment for your abdominal pain and constipation.  I recommend you take a Dulcolax pill by mouth, as well as a suppository at the same time, drink plenty of water.  This should produce a bowel movement.  Please keep drinking plenty of water this week.

## 2021-05-08 NOTE — ED Provider Notes (Signed)
[facility] EMERGENCY DEPARTMENT Provider Note   CSN: [id number] Arrival date & time: 05/08/21  1842     History  Chief Complaint  Patient presents with   Flank Pain    [name] is a 86 y.o. female present emerged department with abdominal pain.  Patient reports has intermittent episodes of abdominal pain for the past 5 days.  She has felt some mild nausea, no vomiting of this.  She does suffer from constipation, had to take a laxative earlier this week, and felt that she had some relief of her pain with a bowel movement.  The pain tends to come and go.  Is not worse with eating.  She has had similar episodes in the past that resolved but never for this long.  She is currently asymptomatic.  She did note her blood pressure was higher than normal today, thinks it may be pain related.  She is due for her evening metoprolol 50 mg, but is taken her other blood pressure medicines.  She denies chest pain or pressure headache or blurred vision.  She is here with her husband at the bedside.  She does report a surgical history of cholecystectomy (open) and possible hysterectomy, although she is not sure.  HPI     Home Medications Prior to Admission medications   Medication Sig Start Date End Date Taking? Authorizing Provider  acetaminophen (TYLENOL) 500 MG tablet Take 1,000 mg by mouth daily as needed for moderate pain or headache.    [provider]  ALOE VERA PO Take 1 capsule by mouth daily.    [provider]  Ascorbic Acid (VITAMIN C) 1000 MG tablet Take 1,000 mg by mouth daily.    [provider]  aspirin EC 81 MG tablet Take 1 tablet (81 mg total) by mouth daily. 01/12/18   [name], DO  BEE POLLEN PO Take 400 mg by mouth daily.    [provider]  clobetasol cream (TEMOVATE) 3.78 % APPLY 1 APPLICATION TOPICALLY 2 (TWO) TIMES DAILY AS NEEDED. 07/01/18   [name], DO  Coenzyme Q10 (COQ-10) 100 MG CAPS Take 100  mg by mouth daily.    [provider]  furosemide (LASIX) 20 MG tablet Take 20 mg by mouth daily as needed.    [provider]  losartan (COZAAR) 50 MG tablet TAKE 1 TABLET BY MOUTH EVERY DAY 05/29/20   [name], MD  metoprolol tartrate (LOPRESSOR) 50 MG tablet TAKE 1 TABLET BY MOUTH TWICE A DAY 02/14/21   [name], MD  Misc Natural Products (TART CHERRY ADVANCED PO) Take by mouth daily.    [provider]  mupirocin ointment (BACTROBAN) 2 % Apply to the affected area twice daily for 7-10 days 12/27/18   [name], DO  OVER THE COUNTER MEDICATION Take 1 capsule by mouth 2 (two) times daily. Hemp Oil    [provider]  rosuvastatin (CRESTOR) 5 MG tablet Take 1 tablet (5 mg total) by mouth daily. 07/15/20   [name], DO  UNABLE TO FIND Med Name: HEMP- Tumeric cream / RUB    [provider]      Allergies    Tape and Prolia [denosumab]    Review of Systems   Review of Systems  Physical Exam Updated Vital Signs BP (!) 168/58 (BP Location: Right Arm)    Pulse (!) 54    Temp 98.2 F (36.8 C) (Temporal)    Resp  16    Ht 5\' 2"  (1.575 m)    Wt 71.7 kg    SpO2 99%    BMI 28.90 kg/m  Physical Exam Constitutional:      General: She is not in acute distress. HENT:     Head: Normocephalic and atraumatic.  Eyes:     Conjunctiva/sclera: Conjunctivae normal.     Pupils: Pupils are equal, round, and reactive to light.  Cardiovascular:     Rate and Rhythm: Normal rate and regular rhythm.  Pulmonary:     Effort: Pulmonary effort is normal. No respiratory distress.  Abdominal:     General: There is no distension.     Tenderness: There is no abdominal tenderness. There is no right CVA tenderness, left CVA tenderness or guarding.  Skin:    General: Skin is warm and dry.  Neurological:     General: No focal deficit present.     Mental Status: She is alert and oriented to person, place, and time. Mental status is at baseline.   Psychiatric:        Mood and Affect: Mood normal.        Behavior: Behavior normal.    ED Results / Procedures / Treatments   Labs (all labs ordered are listed, but only abnormal results are displayed) Labs Reviewed  COMPREHENSIVE METABOLIC PANEL - Abnormal; Notable for the following components:      Result Value   Glucose, Bld 101 (*)    All other components within normal limits  LIPASE, BLOOD - Abnormal; Notable for the following components:   Lipase 102 (*)    All other components within normal limits  CBC WITH DIFFERENTIAL/PLATELET  TROPONIN I (HIGH SENSITIVITY)  TROPONIN I (HIGH SENSITIVITY)    EKG EKG Interpretation  Date/Time:  Thursday May 08 2021 19:13:17 EST Ventricular Rate:  60 PR Interval:  180 QRS Duration: 74 QT Interval:  414 QTC Calculation: 414 R Axis:   82 Text Interpretation: Normal sinus rhythm Normal ECG When compared with ECG of 21-Oct-2017 12:50, PREVIOUS ECG IS PRESENT Confirmed by Octaviano Glow [id number] on 05/08/2021 9:15:50 PM  Radiology CT ABDOMEN PELVIS W CONTRAST  Result Date: 05/08/2021 CLINICAL DATA:  Acute pancreatitis, right flank pain. EXAM: CT ABDOMEN AND PELVIS WITH CONTRAST TECHNIQUE: Multidetector CT imaging of the abdomen and pelvis was performed using the standard protocol following bolus administration of intravenous contrast. RADIATION DOSE REDUCTION: This exam was performed according to the departmental dose-optimization program which includes automated exposure control, adjustment of the mA and/or kV according to patient size and/or use of iterative reconstruction technique. CONTRAST:  61mL OMNIPAQUE IOHEXOL 300 MG/ML  SOLN COMPARISON:  CT abdomen and pelvis 06/11/2015. FINDINGS: Lower chest: No acute abnormality. Hepatobiliary: No focal liver abnormality is seen. Status post cholecystectomy. No biliary dilatation. Pancreas: Unremarkable. No pancreatic ductal dilatation or surrounding inflammatory changes. Spleen: Normal in size  without focal abnormality. Adrenals/Urinary Tract: There are rounded hypodensities in both kidneys which are too small to characterize, most likely cysts. At least 1 of these is complex with punctate peripheral calcification image 8/7. There is no hydronephrosis. Adrenal glands and bladder are within normal limits. Stomach/Bowel: Stomach is within normal limits. No evidence of bowel wall thickening, distention, or inflammatory changes. There is sigmoid and descending colon diverticulosis without evidence for acute diverticulitis. The appendix is not seen. Vascular/Lymphatic: Aortic atherosclerosis. No enlarged abdominal or pelvic lymph nodes. Reproductive: Status post hysterectomy. No adnexal masses. Other: There is a small fat containing right inguinal hernia. There  is no free fluid or free air. Musculoskeletal: Degenerative changes affect the spine and hips. IMPRESSION: 1. No acute localizing process in the abdomen or pelvis. 2. Colonic diverticulosis without evidence for acute diverticulitis. 3. Bilateral renal cysts and complex cysts. 4.  Aortic Atherosclerosis (ICD10-I70.0). Electronically Signed   By: Ronney Asters M.D.   On: 05/08/2021 22:22    Procedures Procedures    Medications Ordered in ED Medications  metoprolol tartrate (LOPRESSOR) tablet 50 mg (50 mg Oral Given 05/08/21 2138)  iohexol (OMNIPAQUE) 300 MG/ML solution 80 mL (80 mLs Intravenous Contrast Given 05/08/21 2210)    ED Course/ Medical Decision Making/ A&P Clinical Course as of 05/09/21 1025  Thu May 08, 2021  2303 Patient remains asymptomatic, daughter not present at bedside.  I discussed the CT findings.  This is highly consistent with mild acute uncomplicated pancreatitis.  I suspect that she may also have some component of constipation, she does complain of this issue.  We just talked about a laxative regimen at home, she has a GI doctor will need to follow-up with them.  Otherwise she is feeling well and wants to go home, which  I think is reasonable.  Doubt UTI at this time [MT]    Clinical Course User Index [MT] Savahanna Almendariz, Carola Rhine, MD                           Medical Decision Making Amount and/or Complexity of Data Reviewed Radiology: ordered.  Risk Prescription drug management.   This patient presents to the Emergency Department with complaint of abdominal pain. This involves an extensive number of treatment options, and is a complaint that carries with it a high risk of complications and morbidity.  The differential diagnosis includes, but is not limited to, gastritis vs biliary disease vs peptic ulcer vs constipation vs colitis vs UTI vs other  I ordered, reviewed, and interpreted labs, including BMP and CBC.  There were no immediate, life-threatening emergencies found in this labwork.  Patient's lipase does show elevation at 102.   I ordered medication oral metoprolol for evening blood pressure medication. I ordered imaging studies which included CT abdomen pelvis I independently visualized and interpreted imaging which showed no focal findings to explain patient's symptoms; and the monitor tracing which showed NSR Additional history was obtained from patient's husband at bedside; patient's daughter at bedside I personally reviewed the patients ECG  which showed sinus rhythm with no acute ischemic findings  I do not see evidence of hypertensive emergency otherwise, specifically no headache, blurred vision, chest pain, or AKI.  After the interventions stated above, I reevaluated the patient and found that they remained clinically stable - asymptomatic.  Based on the patient's clinical exam, vital signs, risk factors, and ED testing, I felt that the patient's overall risk of life-threatening emergency such as bowel perforation, surgical emergency, or sepsis was quite low.  I suspect this clinical presentation is most consistent with nonspecific abdominal pain, but explained to the patient that this evaluation  was not a definitive diagnostic workup.  I discussed outpatient follow up with primary care provider, and provided specialist office number on the patient's discharge paper if a referral was deemed necessary.  I discussed return precautions with the patient. I felt the patient was clinically stable for discharge.         Final Clinical Impression(s) / ED Diagnoses Final diagnoses:  Acute pancreatitis without infection or necrosis, unspecified pancreatitis type  Constipation,  unspecified constipation type    Rx / DC Orders ED Discharge Orders     None         Wyvonnia Dusky, MD 05/09/21 1025

## 2021-05-08 NOTE — Progress Notes (Signed)
Subjective:  Patient ID: [name], female    DOB: 1936-03-09, 86 y.o.   MRN: [id number]  Patient Care Team: [name], DO as PCP - General (Family Medicine) [name], MD as PCP - Cardiology (Cardiology) Chipper Herb, MD (Inactive) as Attending Physician (Family Medicine) [name], MD as Consulting Physician (Cardiology) Inda Castle, MD (Inactive) as Consulting Physician (Gastroenterology) Aplington, Laurice Record, MD (Inactive) as Consulting Physician (Orthopedic Surgery) Clearnce Sorrel, MD as Referring Physician (Neurology) Rutherford Guys, MD as Consulting Physician (Ophthalmology) Ilean China, RN as Registered Nurse   Chief Complaint:  Abdominal Pain   HPI: [name] is a 86 y.o. female presenting on 05/08/2021 for Abdominal Pain   Pt presents today for evaluation of right mid abdominal pain which radiates to her right mid back. She has a history of moderate aortic stenosis s/p TAVR, hypertension, hyperlipidemia, OSA, GERD, and fibromyalgia. She states the abdominal and back pain has been intermittent for several weeks but worse over the last few days. She also endorses headaches, fatigue and malaise, substernal chest pain, and bilateral lower extremity edema. Slight nausea without vomiting.     Relevant past medical, surgical, family, and social history reviewed and updated as indicated.  Allergies and medications reviewed and updated. Data reviewed: Chart in Epic.   Past Medical History:  Diagnosis Date   Chronic headaches    Fibromyalgia    GERD (gastroesophageal reflux disease)    Hemorrhoids    INTERNAL--  POST BANDING 02/ 2015   History of kidney stones    Hyperlipidemia    Hypertension    Moderate aortic stenosis    OSA (obstructive sleep apnea) CPAP INTOLERANT    Osteoporosis    S/P TAVR (transcatheter aortic valve replacement) 10/19/2017   20 mm Edwards Sapien 3 transcatheter heart valve placed via percutaneous right  transfemoral approach    Severe aortic stenosis    Spinal stenosis, multilevel     Past Surgical History:  Procedure Laterality Date   ANTERIOR CERVICAL DECOMP/DISCECTOMY FUSION  11-11-1999   C5 - C6   APPENDECTOMY  AGE 72   CARDIAC CATHETERIZATION  2008  DR NISHAN   ESSENTIALLY NORMAL   CATARACT EXTRACTION Bilateral    CATARACT EXTRACTION W/ INTRAOCULAR LENS  IMPLANT, BILATERAL  2012   EYE SURGERY     FLEXIBLE SIGMOIDOSCOPY N/A 06/01/2013   Procedure: FLEXIBLE SIGMOIDOSCOPY;  Surgeon: Inda Castle, MD;  Location: [facility];  Service: Endoscopy;  Laterality: N/A;  may need hemorrhoidal banding   HEMORRHOIDECTOMY WITH HEMORRHOID BANDING  08-07-2010   IR RADIOLOGY PERIPHERAL GUIDED IV START  06/27/2019   IR US GUIDE VASC ACCESS RIGHT  06/27/2019   LAPAROSCOPIC CHOLECYSTECTOMY  1990   RIGHT/LEFT HEART CATH AND CORONARY ANGIOGRAPHY N/A 09/29/2017   Procedure: RIGHT/LEFT HEART CATH AND CORONARY ANGIOGRAPHY;  Surgeon: Sherren Mocha, MD;  Location: [facility];  Service: Cardiovascular;  Laterality: N/A;   SHOULDER ARTHROSCOPY WITH OPEN ROTATOR CUFF REPAIR AND DISTAL CLAVICLE ACROMINECTOMY Right 05/25/2012   Procedure: SHOULDER ARTHROSCOPY WITH OPEN ROTATOR CUFF REPAIR AND DISTAL CLAVICLE ACROMINECTOMY;  Surgeon: Magnus Sinning, MD;  Location: [facility];  Service: Orthopedics;  Laterality: Right;  RIGHT SHOULDER ARTHROSCOPY WITH DERBRIDEMENTOF LABRAL/BICEP TENDON, OPEN DISTAL CLAVICLE RESECTION, ANTERIOR ACROMINECTOMY ROTATOR CUFF REPAIR ANESTHESIA: GENERAL/SCALENE NERVE BLOCK   TEE WITHOUT CARDIOVERSION Bilateral 10/19/2017   Procedure: TRANSESOPHAGEAL ECHOCARDIOGRAM (TEE);  Surgeon: Sherren Mocha, MD;  Location: [facility];  Service:  Open Heart Surgery;  Laterality: Bilateral;   TENOSYNOVECTOMY Left 01/04/2014   Procedure: LEFT WRIST EXTENSOR TENOSYNOVECTOMY;  Surgeon: Linna Hoff, MD;  Location: [facility];  Service: Orthopedics;  Laterality: Left;    THYROIDECTOMY  AGE 67   GOITER   TONSILLECTOMY AND ADENOIDECTOMY  AGE 31   TRANSCATHETER AORTIC VALVE REPLACEMENT, TRANSFEMORAL  10/19/2017   TRANSCATHETER AORTIC VALVE REPLACEMENT, TRANSFEMORAL Bilateral 10/19/2017   Procedure: TRANSCATHETER AORTIC VALVE REPLACEMENT, TRANSFEMORAL;  Surgeon: Sherren Mocha, MD;  Location: [facility];  Service: Open Heart Surgery;  Laterality: Bilateral;   TRANSTHORACIC ECHOCARDIOGRAM  last one 04-20-2013  DR NISHAN    NORMAL LVSF/ EF 60-65%/ MODERATE  AV  STENOSIS WITH NO AR /  MILD LAE   UMBILICAL HERNIA REPAIR  JUNE 2006   VAGINAL HYSTERECTOMY  01-31-2001   ANTERIOR & POSTERIOR REPAIR/ TRANSVAGINAL BLADDER SLING    Social History   Socioeconomic History   Marital status: Married    Spouse name: Shanon Brow   Number of children: 6   Years of education: 6   Highest education level: 6th grade  Occupational History   Occupation: Retired  Tobacco Use   Smoking status: Former    Packs/day: 0.25    Years: 5.00    Pack years: 1.25    Types: Cigarettes    Quit date: 04/14/2003    Years since quitting: 18.0   Smokeless tobacco: Never  Vaping Use   Vaping Use: Never used  Substance and Sexual Activity   Alcohol use: Yes    Comment: once in a while-social   Drug use: Never    Comment: hemp oil    Sexual activity: Not Currently  Other Topics Concern   Not on file  Social History Narrative   Not on file   Social Determinants of Health   Financial Resource Strain: Not on file  Food Insecurity: Not on file  Transportation Needs: Not on file  Physical Activity: Not on file  Stress: Not on file  Social Connections: Not on file  Intimate Partner Violence: Not on file    Outpatient Encounter Medications as of 05/08/2021  Medication Sig   acetaminophen (TYLENOL) 500 MG tablet Take 1,000 mg by mouth daily as needed for moderate pain or headache.   ALOE VERA PO Take 1 capsule by mouth daily.   Ascorbic Acid (VITAMIN C) 1000 MG tablet Take 1,000 mg by mouth  daily.   aspirin EC 81 MG tablet Take 1 tablet (81 mg total) by mouth daily.   BEE POLLEN PO Take 400 mg by mouth daily.   clobetasol cream (TEMOVATE) 1.02 % APPLY 1 APPLICATION TOPICALLY 2 (TWO) TIMES DAILY AS NEEDED.   Coenzyme Q10 (COQ-10) 100 MG CAPS Take 100 mg by mouth daily.   furosemide (LASIX) 20 MG tablet Take 20 mg by mouth daily as needed.   losartan (COZAAR) 50 MG tablet TAKE 1 TABLET BY MOUTH EVERY DAY   metoprolol tartrate (LOPRESSOR) 50 MG tablet TAKE 1 TABLET BY MOUTH TWICE A DAY   Misc Natural Products (TART CHERRY ADVANCED PO) Take by mouth daily.   mupirocin ointment (BACTROBAN) 2 % Apply to the affected area twice daily for 7-10 days   OVER THE COUNTER MEDICATION Take 1 capsule by mouth 2 (two) times daily. Hemp Oil   rosuvastatin (CRESTOR) 5 MG tablet Take 1 tablet (5 mg total) by mouth daily.   UNABLE TO FIND Med Name: HEMP- Tumeric cream / RUB   [DISCONTINUED] prednisoLONE acetate (PRED FORTE)  1 % ophthalmic suspension Place 1 drop into the right eye 4 (four) times daily. (Patient taking differently: Place 1 drop into the right eye 2 (two) times a day.)   No facility-administered encounter medications on file as of 05/08/2021.    Allergies  Allergen Reactions   Tape Other (See Comments)    Dermatitis rash "with extended exposure"   Prolia [Denosumab] Other (See Comments)    Arthralgia/ myalgia/ jaw pain/ headache    Review of Systems  Constitutional:  Positive for activity change, appetite change and fatigue. Negative for chills, diaphoresis, fever and unexpected weight change.  Eyes:  Negative for photophobia and visual disturbance.  Respiratory:  Positive for cough. Negative for apnea, choking, chest tightness, shortness of breath, wheezing and stridor.   Cardiovascular:  Positive for chest pain and leg swelling. Negative for palpitations.  Gastrointestinal:  Positive for abdominal pain and nausea. Negative for abdominal distention, anal bleeding, blood in  stool, constipation, diarrhea, rectal pain and vomiting.  Genitourinary:  Negative for decreased urine volume and difficulty urinating.  Musculoskeletal:  Positive for back pain.  Skin:  Positive for color change.  Neurological:  Positive for headaches. Negative for dizziness, tremors, seizures, syncope, facial asymmetry, speech difficulty, weakness, light-headedness and numbness.  Psychiatric/Behavioral:  Negative for confusion.   All other systems reviewed and are negative.      Objective:  BP (!) 218/67 (BP Location: Right Arm, Cuff Size: Normal)    Pulse 62    Ht 5\' 2"  (1.575 m)    Wt 157 lb (71.2 kg)    SpO2 97%    BMI 28.72 kg/m    Wt Readings from Last 3 Encounters:  05/08/21 157 lb (71.2 kg)  08/30/20 155 lb (70.3 kg)  07/15/20 154 lb 12.8 oz (70.2 kg)    Physical Exam Vitals and nursing note reviewed.  Constitutional:      General: She is not in acute distress.    Appearance: She is obese. She is ill-appearing. She is not toxic-appearing or diaphoretic.  HENT:     Head: Normocephalic and atraumatic.     Mouth/Throat:     Mouth: Mucous membranes are moist.  Eyes:     Conjunctiva/sclera: Conjunctivae normal.     Pupils: Pupils are equal, round, and reactive to light.  Cardiovascular:     Rate and Rhythm: Normal rate and regular rhythm.     Heart sounds: Murmur heard.    No friction rub. No gallop.  Pulmonary:     Effort: Pulmonary effort is normal.     Breath sounds: Normal breath sounds.  Abdominal:     General: Bowel sounds are normal. There is no distension.     Palpations: Abdomen is soft. There is no mass.     Tenderness: There is no abdominal tenderness. There is no right CVA tenderness, left CVA tenderness, guarding or rebound.     Hernia: No hernia is present.  Musculoskeletal:     Cervical back: Neck supple.     Right lower leg: 1+ Edema present.     Left lower leg: 1+ Edema present.  Skin:    General: Skin is warm.     Capillary Refill: Capillary  refill takes less than 2 seconds.     Comments: Grayish-blue hue to face when sitting or standing, ruddy hue to face when supine  Neurological:     General: No focal deficit present.     Mental Status: She is alert and oriented to person, place, and time.  Psychiatric:        Mood and Affect: Mood normal.        Thought Content: Thought content normal.    Results for orders placed or performed in visit on 02/20/20  ECHOCARDIOGRAM COMPLETE  Result Value Ref Range   Area-P 1/2 2.11 cm2   S' Lateral 2.60 cm   AV Area mean vel 1.60 cm2   AR max vel 1.57 cm2   AV Area VTI 1.74 cm2   P 1/2 time 269 msec   Ao pk vel 2.63 m/s   AV Mean grad 16.0 mmHg   AV Peak grad 27.6 mmHg     EKG in office: SR 60, PR 176 ms, QT 434 ms, no acute ST-T changes or ectopy, borderline LVH pattern. Monia Pouch, FNP-C.  FS Hgb in office 13.1   Pertinent labs & imaging results that were available during my care of the patient were reviewed by me and considered in my medical decision making.  Assessment & Plan:  [name] was seen today for abdominal pain.  Diagnoses and all orders for this visit:  Chest pain in adult Hypertensive urgency Malaise and fatigue Worsening headaches Bilateral lower extremity edema S/P TAVR (transcatheter aortic valve replacement) Pallor Hypertensive with noted discoloration and wide pulse pressure. Differentials considered: aortic dissection, hypertensive emergency, acute pancreatitis, failing TAVR / worsening aortic stenosis, NSTEMI, and heart failure along with others. Hgb normal in office, EKG without acute changes. Plan is to send pt to ED for evaluation. Spoke with Dr. Warrick Parisian and he agrees with plan. Pt declined ambulance as she does not want to go to [facility], husband is taking her to [facility] ED.  -     Hemoglobin, fingerstick - 13.1 in office.        -     EKG - no acute changes.     Follow up plan: To ED for evaluation and treatment. Declined ambulance   The above  assessment and management plan was discussed with the patient. The patient verbalized understanding of and has agreed to the management plan. Patient is aware to call the clinic if they develop any new symptoms or if symptoms persist or worsen. Patient is aware when to return to the clinic for a follow-up visit. Patient educated on when it is appropriate to go to the emergency department.   Monia Pouch, FNP-C [facility] [phone]

## 2021-05-09 ENCOUNTER — Telehealth: Payer: Self-pay | Admitting: Cardiovascular Disease

## 2021-05-09 ENCOUNTER — Encounter: Payer: Self-pay | Admitting: Nurse Practitioner

## 2021-05-09 ENCOUNTER — Ambulatory Visit (INDEPENDENT_AMBULATORY_CARE_PROVIDER_SITE_OTHER): Payer: Medicare Other | Admitting: Nurse Practitioner

## 2021-05-09 VITALS — BP 152/38 | HR 55 | Temp 98.3°F | Ht 62.0 in | Wt 159.0 lb

## 2021-05-09 DIAGNOSIS — Z09 Encounter for follow-up examination after completed treatment for conditions other than malignant neoplasm: Secondary | ICD-10-CM | POA: Diagnosis not present

## 2021-05-09 DIAGNOSIS — I10 Essential (primary) hypertension: Secondary | ICD-10-CM

## 2021-05-09 LAB — HEMOGLOBIN, FINGERSTICK: Hemoglobin: 13.1 g/dL (ref 11.1–15.9)

## 2021-05-09 NOTE — Assessment & Plan Note (Signed)
Patient follow up after ED visit for uncontrolled blood pressure. Symptoms are resolving gradually, completed ED discharge instructions, follow up visits and medication reconciliation.

## 2021-05-09 NOTE — Telephone Encounter (Signed)
Called patient back. Patient stated her PCP and the hospital wanted her to see Cardiology right away. Will put patient on schedule for Monday, first available. Patient stated she feels fine right now. Encouraged patient to go to ED if her symptoms come back. Patient verbalized understanding.

## 2021-05-09 NOTE — Telephone Encounter (Signed)
Pt c/o BP issue: STAT if pt c/o blurred vision, one-sided weakness or slurred speech  1. What are your last 5 BP readings? *all taken today* 152/48 191/86 167/85 174/54  2. Are you having any other symptoms (ex. Dizziness, headache, blurred vision, passed out)? Not anything abnormal. She does have arthritis and fibromyalgia so she is in pain all the time  3. What is your BP issue? Elevated BP. Patient said she feels fine though  Patient went to her MD because she was having stomach pains. She thought something was wrong with her kidneys. When she was at the MD, her BP was high (218/67) and she was sent to the ED as they were worried about her having a stroke.She was treated in the ED and was told to follow up with Dr. Johnsie Cancel after being in the ED.

## 2021-05-09 NOTE — Patient Instructions (Signed)

## 2021-05-09 NOTE — Progress Notes (Signed)
Date:  05/12/2021   ID:  [name], DOB Mar 28, 1936, MRN [id number]  PCP:  [name], DO  Cardiologist:  Dr. Ala Bent: Dr. Stephannie Peters   Chief Complaint: BP follow up  History of Present Illness:   86 y.o. f/u for TAVR done 10/19/17 with 20 mm Sapien valve stable moderate PVL by last TTE 02/20/20 mean gradient 16 mmHg. Cath 09/29/17 prior to TAVR with no CAD   Seen by primary 05/08/19 for vetigo Rx meclizine  Cardiac CTA 06/27/19 reviewed HALT but no HAM no thrombus Small gap outside the right and non coronary cusp likely responsible for PvL   LE edema better off Norvasc and taking Lasix   Seen by primary with some gatritis 07/15/20 and referred to GI  She has taken colloidal silver for 15 years to boost her immune system This has resulted in some discoloration of her skin/nails   No dyspnea, CHF, palpitations or syncope   Seen in ED 05/08/21 for flank pain for 5 days mild nausea chronic constipation BP elevated related to pain BP 168/58 mmHg CT with no acute findings diverticulosis no acute diverticulitis   Normally on losartan 50 mg, lopressor 50 mg bid and lasix for BP   She is teary eyed. Has lost brother to lung cancer, and two son's ( one suicide) in the last 6 months    Past Medical History:  Diagnosis Date   Chronic headaches    Fibromyalgia    GERD (gastroesophageal reflux disease)    Hemorrhoids    INTERNAL--  POST BANDING 02/ 2015   History of kidney stones    Hyperlipidemia    Hypertension    Moderate aortic stenosis    OSA (obstructive sleep apnea) CPAP INTOLERANT    Osteoporosis    S/P TAVR (transcatheter aortic valve replacement) 10/19/2017   20 mm Edwards Sapien 3 transcatheter heart valve placed via percutaneous right transfemoral approach    Severe aortic stenosis    Spinal stenosis, multilevel     Past Surgical History:  Procedure Laterality Date   ANTERIOR CERVICAL DECOMP/DISCECTOMY FUSION  11-11-1999   C5 - C6   APPENDECTOMY   AGE 47   CARDIAC CATHETERIZATION  2008  DR Lemonte Al   ESSENTIALLY NORMAL   CATARACT EXTRACTION Bilateral    CATARACT EXTRACTION W/ INTRAOCULAR LENS  IMPLANT, BILATERAL  2012   EYE SURGERY     FLEXIBLE SIGMOIDOSCOPY N/A 06/01/2013   Procedure: FLEXIBLE SIGMOIDOSCOPY;  Surgeon: Inda Castle, MD;  Location: [facility];  Service: Endoscopy;  Laterality: N/A;  may need hemorrhoidal banding   HEMORRHOIDECTOMY WITH HEMORRHOID BANDING  08-07-2010   IR RADIOLOGY PERIPHERAL GUIDED IV START  06/27/2019   IR US GUIDE VASC ACCESS RIGHT  06/27/2019   LAPAROSCOPIC CHOLECYSTECTOMY  1990   RIGHT/LEFT HEART CATH AND CORONARY ANGIOGRAPHY N/A 09/29/2017   Procedure: RIGHT/LEFT HEART CATH AND CORONARY ANGIOGRAPHY;  Surgeon: Sherren Mocha, MD;  Location: [facility];  Service: Cardiovascular;  Laterality: N/A;   SHOULDER ARTHROSCOPY WITH OPEN ROTATOR CUFF REPAIR AND DISTAL CLAVICLE ACROMINECTOMY Right 05/25/2012   Procedure: SHOULDER ARTHROSCOPY WITH OPEN ROTATOR CUFF REPAIR AND DISTAL CLAVICLE ACROMINECTOMY;  Surgeon: Magnus Sinning, MD;  Location: [facility];  Service: Orthopedics;  Laterality: Right;  RIGHT SHOULDER ARTHROSCOPY WITH DERBRIDEMENTOF LABRAL/BICEP TENDON, OPEN DISTAL CLAVICLE RESECTION, ANTERIOR ACROMINECTOMY ROTATOR CUFF REPAIR ANESTHESIA: GENERAL/SCALENE NERVE BLOCK   TEE WITHOUT CARDIOVERSION Bilateral 10/19/2017   Procedure: TRANSESOPHAGEAL ECHOCARDIOGRAM (TEE);  Surgeon: Sherren Mocha,  MD;  Location: [facility];  Service: Open Heart Surgery;  Laterality: Bilateral;   TENOSYNOVECTOMY Left 01/04/2014   Procedure: LEFT WRIST EXTENSOR TENOSYNOVECTOMY;  Surgeon: Linna Hoff, MD;  Location: [facility];  Service: Orthopedics;  Laterality: Left;   THYROIDECTOMY  AGE 21   GOITER   TONSILLECTOMY AND ADENOIDECTOMY  AGE 73   TRANSCATHETER AORTIC VALVE REPLACEMENT, TRANSFEMORAL  10/19/2017   TRANSCATHETER AORTIC VALVE REPLACEMENT, TRANSFEMORAL Bilateral 10/19/2017    Procedure: TRANSCATHETER AORTIC VALVE REPLACEMENT, TRANSFEMORAL;  Surgeon: Sherren Mocha, MD;  Location: [place];  Service: Open Heart Surgery;  Laterality: Bilateral;   TRANSTHORACIC ECHOCARDIOGRAM  last one 04-20-2013  DR [facility]    NORMAL LVSF/ EF 60-65%/ MODERATE  AV  STENOSIS WITH NO AR /  MILD LAE   UMBILICAL HERNIA REPAIR  JUNE 2006   VAGINAL HYSTERECTOMY  01-31-2001   ANTERIOR & POSTERIOR REPAIR/ TRANSVAGINAL BLADDER SLING    Current Medications: Prior to Admission medications   Medication Sig Start Date End Date Taking? Authorizing Provider  acetaminophen (TYLENOL) 500 MG tablet Take 1,000 mg by mouth daily as needed for moderate pain or headache.    [provider]  ALOE VERA PO Take 1 capsule by mouth daily.    [provider]  aspirin EC 81 MG tablet Take 1 tablet (81 mg total) by mouth daily. 01/12/18   [name], DO  BEE POLLEN PO Take 400 mg by mouth daily.     [provider]  clobetasol cream (TEMOVATE) 6.78 % Apply 1 application topically 2 (two) times daily as needed. Patient taking differently: Apply 1 application topically 2 (two) times daily as needed (for rash).  03/19/17   [name], MD  clopidogrel (PLAVIX) 75 MG tablet Take 1 tablet (75 mg total) by mouth daily with breakfast. 01/12/18   [name], DO  Coenzyme Q10 (COQ-10) 100 MG CAPS Take 100 mg by mouth daily.    [provider]  diclofenac sodium (VOLTAREN) 1 % GEL APPLY 3 GRAMS TO 3 LARGE JOINTS UP TO 3 TIMES DIALY 12/14/17   Bo Merino, MD  losartan (COZAAR) 25 MG tablet Take 1 tablet (25 mg total) by mouth daily. 01/17/18   [name], MD  metoprolol tartrate (LOPRESSOR) 50 MG tablet Take 1 tablet (50 mg total) by mouth 2 (two) times daily. 01/17/18   [name], MD  OVER THE COUNTER MEDICATION Take 1 capsule by mouth 2 (two) times daily. Hemp Oil    [provider]  polyethylene glycol (MIRALAX / GLYCOLAX) packet Take 17 g by mouth  daily as needed for moderate constipation.    [provider]  Polyethylene Glycol 400 (BLINK TEARS) 0.25 % SOLN Place 1-2 drops into both eyes 3 (three) times daily.     [provider]  rosuvastatin (CRESTOR) 5 MG tablet Take 1 tablet (5 mg total) by mouth daily. 01/12/18   [name], DO  TURMERIC PO Take 1 capsule by mouth daily.    [provider]  vitamin B-12 (CYANOCOBALAMIN) 1000 MCG tablet Take 1,000 mcg by mouth daily.    [provider]    Allergies:   Tape and Prolia [denosumab]   Social History   Socioeconomic History   Marital status: Married    Spouse name: Shanon Brow   Number of children: 6   Years of education: 6   Highest education level: 6th grade  Occupational History   Occupation: Retired  Tobacco Use  Smoking status: Former    Packs/day: 0.25    Years: 5.00    Pack years: 1.25    Types: Cigarettes    Quit date: 04/14/2003    Years since quitting: 18.0   Smokeless tobacco: Never  Vaping Use   Vaping Use: Never used  Substance and Sexual Activity   Alcohol use: Yes    Comment: once in a while-social   Drug use: Never    Comment: hemp oil    Sexual activity: Not Currently  Other Topics Concern   Not on file  Social History Narrative   Not on file   Social Determinants of Health   Financial Resource Strain: Not on file  Food Insecurity: Not on file  Transportation Needs: Not on file  Physical Activity: Not on file  Stress: Not on file  Social Connections: Not on file     Family History:  The patient's family history includes Cancer (age of onset: 54) in her son; Cirrhosis in her mother; Gallbladder disease in her maternal grandmother; Heart disease in her sister; Other in her brother; Stomach cancer in her paternal grandfather.   ROS:   Please see the history of present illness.    ROS All other systems reviewed and are negative.   PHYSICAL EXAM:   VS:  BP (!) 156/54    Pulse 64    Ht 5\' 2"  (1.575 m)     Wt 158 lb (71.7 kg)    BMI 28.90 kg/m     Affect appropriate Healthy:  appears stated age 17: normal Neck supple with no adenopathy JVP normal no bruits no thyromegaly Lungs clear with no wheezing and good diaphragmatic motion Heart:  S1/S2 SEM through TAVR valve AR murmur, no rub, gallop or click PMI normal Abdomen: benighn, BS positve, no tenderness, no AAA no bruit.  No HSM or HJR Distal pulses intact with no bruits No edema Neuro non-focal Skin blue discoloration for colloidal silver exposure  No muscular weakness    Wt Readings from Last 3 Encounters:  05/12/21 158 lb (71.7 kg)  05/09/21 159 lb (72.1 kg)  05/08/21 158 lb (71.7 kg)      Studies/Labs Reviewed:   EKG:   05/12/2021   SR rate 58  normal   Recent Labs: 05/08/2021: ALT 19; BUN 12; Creatinine, Ser 0.82; Hemoglobin 12.0; Platelets 208; Potassium 4.7; Sodium 139   Lipid Panel    Component Value Date/Time   CHOL 145 05/25/2017 0844   CHOL 219 (H) 09/07/2012 0935   TRIG 136 05/25/2017 0844   TRIG 180 (H) 02/17/2013 1007   TRIG 326 (H) 09/07/2012 0935   HDL 47 05/25/2017 0844   HDL 51 02/17/2013 1007   HDL 47 09/07/2012 0935   CHOLHDL 3.1 05/25/2017 0844   CHOLHDL 5.2 10/09/2006 0435   VLDL 58 (H) 10/09/2006 0435   LDLCALC 71 05/25/2017 0844   LDLCALC 89 02/17/2013 1007   LDLCALC 107 (H) 09/07/2012 0935    Additional studies/ records that were reviewed today include:     2D ECHO  02/20/20   Study Conclusions   - Left ventricle: The cavity size was normal. There was mild focal    basal hypertrophy of the septum. Systolic function was vigorous.    The estimated ejection fraction was in the range of 65% to 70%.    Wall motion was normal; there were no regional wall motion    abnormalities. Doppler parameters are consistent with abnormal    left ventricular relaxation (grade  1 diastolic dysfunction).  - Aortic valve: A new bioprosthesis was present and functioning    normally. The prosthesis had  a normal range of motion. The device    appeared normal, had no rocking motion, and showed no evidence of    dehiscence. There was trivial regurgitation. Mean gradient (S):    12 mm Hg. Valve area (VTI): 1.95 cm^2. Indexed valve area (VTI):    1.08 cm^2/m^2. Valve area (Vmax): 1.67 cm^2. Valve area (Vmean):    1.7 cm^2.  - Mitral valve: Calcified annulus.   Impressions:   - Post-TAVR, the new bioprosthesis appears well seated without    significant stenosis. Very trivial AR. LV EF is normal.     RIGHT/LEFT HEART CATH AND CORONARY ANGIOGRAPHY  09/29/17  Conclusion   1.  Known severe aortic stenosis by noninvasive assessment with heavily calcified and restricted aortic valve leaflets biplane fluoroscopy 2.  Heavy mitral annular calcium 3.  Widely patent coronary arteries with minor luminal irregularities 4.  Mildly elevated right heart pressures    Recommendation: Continued multidisciplinary heart team evaluation for treatment of severe aortic stenosis     ASSESSMENT & PLAN:    HTN -Recent elevation due to abdominal pain Increase losartan to 100 mg daily due to AR/TAVR Wrote schedule out for her to take lopressor/cozaar in am lasix lunch and 2nd lopressor in pm   2. Severe AS s/p TVAR - suboptimal result with small 20 mm Sapien valve and moderate PVL TTE 02/20/20 with mean gradient stable 16 mmHg and DVI ok 0.55 She is asymptomatic Low diastolic pressure from AR She is not interested in added procedures To "plug" leaky valve   3. Palpitations -  Reviewed Zio monitor done 02/2018 no arrhythmia continue lopressor   4. Vertigo:  Meclizine as needed f/u primary   5. Fatigue/Mailaise:  Non cardiac EF is normal 60-65% ECG normal no arrhythmia or heart block labs seem ok   6. HLD:  Continue statin labs with primary   7. Edema:  Better off norvasc lasix as needed   8. [place]: bluish-gray skin discoloration Primary physicians keep suggesting Patient is hypoxic but likely from  colloidal silver exposure    Increase Cozaar to 100 mg daily     F/U with me in 6 months     Signed, Jenkins Rouge, MD  05/12/2021 11:27 AM    [facility] [place], [address] [place], [place]  [address] Phone: [phone]; Fax: [phone]

## 2021-05-09 NOTE — Assessment & Plan Note (Signed)
Blood pressure is not at goal with current medication. Patient blood pressure values are different from values at home. Patient will schedule cardiology appointment in the next week. Patient was evaluated in the ED 24 hours for elevated blood pressure and diagnosed with acute pancreatitis.. patient is scheduled  Next week with GI.    Patient knows to follow up with worsening unresolved symptoms.

## 2021-05-09 NOTE — Progress Notes (Signed)
Established Patient Office Visit  Subjective:  Patient ID: [name], female    DOB: May 29, 1935  Age: 86 y.o. MRN: [id number]  CC:  Chief Complaint  Patient presents with   Hypertension    HPI [name] presents for Hypertension: Patient here for follow-up of elevated blood pressure. She is not exercising and is not adherent to low salt diet.  Blood pressure is not well controlled at home. Cardiac symptoms chest pain and fatigue. Patient denies syncope.  Cardiovascular risk factors: advanced age (older than 80 for men, 57 for women), dyslipidemia, and hypertension. Use of agents associated with hypertension: none. History of target organ damage: .   Past Medical History:  Diagnosis Date   Chronic headaches    Fibromyalgia    GERD (gastroesophageal reflux disease)    Hemorrhoids    INTERNAL--  POST BANDING 02/ 2015   History of kidney stones    Hyperlipidemia    Hypertension    Moderate aortic stenosis    OSA (obstructive sleep apnea) CPAP INTOLERANT    Osteoporosis    S/P TAVR (transcatheter aortic valve replacement) 10/19/2017   20 mm Edwards Sapien 3 transcatheter heart valve placed via percutaneous right transfemoral approach    Severe aortic stenosis    Spinal stenosis, multilevel     Past Surgical History:  Procedure Laterality Date   ANTERIOR CERVICAL DECOMP/DISCECTOMY FUSION  11-11-1999   C5 - C6   APPENDECTOMY  AGE 10   CARDIAC CATHETERIZATION  2008  DR NISHAN   ESSENTIALLY NORMAL   CATARACT EXTRACTION Bilateral    CATARACT EXTRACTION W/ INTRAOCULAR LENS  IMPLANT, BILATERAL  2012   EYE SURGERY     FLEXIBLE SIGMOIDOSCOPY N/A 06/01/2013   Procedure: FLEXIBLE SIGMOIDOSCOPY;  Surgeon: Inda Castle, MD;  Location: [facility];  Service: Endoscopy;  Laterality: N/A;  may need hemorrhoidal banding   HEMORRHOIDECTOMY WITH HEMORRHOID BANDING  08-07-2010   IR RADIOLOGY PERIPHERAL GUIDED IV START  06/27/2019   IR US GUIDE VASC ACCESS RIGHT  06/27/2019    LAPAROSCOPIC CHOLECYSTECTOMY  1990   RIGHT/LEFT HEART CATH AND CORONARY ANGIOGRAPHY N/A 09/29/2017   Procedure: RIGHT/LEFT HEART CATH AND CORONARY ANGIOGRAPHY;  Surgeon: Sherren Mocha, MD;  Location: [facility];  Service: Cardiovascular;  Laterality: N/A;   SHOULDER ARTHROSCOPY WITH OPEN ROTATOR CUFF REPAIR AND DISTAL CLAVICLE ACROMINECTOMY Right 05/25/2012   Procedure: SHOULDER ARTHROSCOPY WITH OPEN ROTATOR CUFF REPAIR AND DISTAL CLAVICLE ACROMINECTOMY;  Surgeon: Magnus Sinning, MD;  Location: [facility];  Service: Orthopedics;  Laterality: Right;  RIGHT SHOULDER ARTHROSCOPY WITH DERBRIDEMENTOF LABRAL/BICEP TENDON, OPEN DISTAL CLAVICLE RESECTION, ANTERIOR ACROMINECTOMY ROTATOR CUFF REPAIR ANESTHESIA: GENERAL/SCALENE NERVE BLOCK   TEE WITHOUT CARDIOVERSION Bilateral 10/19/2017   Procedure: TRANSESOPHAGEAL ECHOCARDIOGRAM (TEE);  Surgeon: Sherren Mocha, MD;  Location: [facility];  Service: Open Heart Surgery;  Laterality: Bilateral;   TENOSYNOVECTOMY Left 01/04/2014   Procedure: LEFT WRIST EXTENSOR TENOSYNOVECTOMY;  Surgeon: Linna Hoff, MD;  Location: [facility];  Service: Orthopedics;  Laterality: Left;   THYROIDECTOMY  AGE 89   GOITER   TONSILLECTOMY AND ADENOIDECTOMY  AGE 14   TRANSCATHETER AORTIC VALVE REPLACEMENT, TRANSFEMORAL  10/19/2017   TRANSCATHETER AORTIC VALVE REPLACEMENT, TRANSFEMORAL Bilateral 10/19/2017   Procedure: TRANSCATHETER AORTIC VALVE REPLACEMENT, TRANSFEMORAL;  Surgeon: Sherren Mocha, MD;  Location: [place] [place];  Service: Open Heart Surgery;  Laterality: Bilateral;   TRANSTHORACIC ECHOCARDIOGRAM  last one 04-20-2013  DR [facility]    NORMAL LVSF/ EF 60-65%/ MODERATE  AV  STENOSIS WITH NO AR /  MILD LAE   UMBILICAL HERNIA REPAIR  JUNE 2006   VAGINAL HYSTERECTOMY  01-31-2001   ANTERIOR & POSTERIOR REPAIR/ TRANSVAGINAL BLADDER SLING    Family History  Problem Relation Age of Onset   Cirrhosis Mother        drinking   Other Brother         duodenal ulcer   Heart disease Sister    Cancer Son 73       colon   Gallbladder disease Maternal Grandmother    Stomach cancer Paternal Grandfather     Social History   Socioeconomic History   Marital status: Married    Spouse name: Shanon Brow   Number of children: 6   Years of education: 6   Highest education level: 6th grade  Occupational History   Occupation: Retired  Tobacco Use   Smoking status: Former    Packs/day: 0.25    Years: 5.00    Pack years: 1.25    Types: Cigarettes    Quit date: 04/14/2003    Years since quitting: 18.0   Smokeless tobacco: Never  Vaping Use   Vaping Use: Never used  Substance and Sexual Activity   Alcohol use: Yes    Comment: once in a while-social   Drug use: Never    Comment: hemp oil    Sexual activity: Not Currently  Other Topics Concern   Not on file  Social History Narrative   Not on file   Social Determinants of Health   Financial Resource Strain: Not on file  Food Insecurity: Not on file  Transportation Needs: Not on file  Physical Activity: Not on file  Stress: Not on file  Social Connections: Not on file  Intimate Partner Violence: Not on file    Outpatient Medications Prior to Visit  Medication Sig Dispense Refill   acetaminophen (TYLENOL) 500 MG tablet Take 1,000 mg by mouth daily as needed for moderate pain or headache.     ALOE VERA PO Take 1 capsule by mouth daily.     Ascorbic Acid (VITAMIN C) 1000 MG tablet Take 1,000 mg by mouth daily.     aspirin EC 81 MG tablet Take 1 tablet (81 mg total) by mouth daily. 90 tablet 3   BEE POLLEN PO Take 400 mg by mouth daily.     clobetasol cream (TEMOVATE) 2.22 % APPLY 1 APPLICATION TOPICALLY 2 (TWO) TIMES DAILY AS NEEDED. 30 g 3   Coenzyme Q10 (COQ-10) 100 MG CAPS Take 100 mg by mouth daily.     furosemide (LASIX) 20 MG tablet Take 20 mg by mouth daily as needed.     losartan (COZAAR) 50 MG tablet TAKE 1 TABLET BY MOUTH EVERY DAY 90 tablet 3   metoprolol tartrate  (LOPRESSOR) 50 MG tablet TAKE 1 TABLET BY MOUTH TWICE A DAY 180 tablet 2   Misc Natural Products (TART CHERRY ADVANCED PO) Take by mouth daily.     mupirocin ointment (BACTROBAN) 2 % Apply to the affected area twice daily for 7-10 days 22 g 0   OVER THE COUNTER MEDICATION Take 1 capsule by mouth 2 (two) times daily. Hemp Oil     rosuvastatin (CRESTOR) 5 MG tablet Take 1 tablet (5 mg total) by mouth daily. 90 tablet 3   UNABLE TO FIND Med Name: HEMP- Tumeric cream / RUB     No facility-administered medications prior to visit.    Allergies  Allergen Reactions   Tape  Other (See Comments)    Dermatitis rash "with extended exposure"   Prolia [Denosumab] Other (See Comments)    Arthralgia/ myalgia/ jaw pain/ headache    ROS Review of Systems  Constitutional: Negative.   HENT: Negative.    Eyes: Negative.   Respiratory: Negative.    Cardiovascular:  Negative for chest pain, palpitations and leg swelling.  Genitourinary: Negative.   Skin:  Negative for rash.  All other systems reviewed and are negative.    Objective:    Physical Exam Vitals and nursing note reviewed.  Constitutional:      General: She is awake.     Appearance: She is normal weight. She is ill-appearing.  HENT:     Head: Normocephalic.     Right Ear: External ear normal.     Left Ear: External ear normal.     Nose: Nose normal.     Mouth/Throat:     Pharynx: Oropharynx is clear.  Eyes:     Conjunctiva/sclera: Conjunctivae normal.  Cardiovascular:     Rate and Rhythm: Normal rate and regular rhythm.  Pulmonary:     Effort: Pulmonary effort is normal.     Breath sounds: Normal breath sounds.  Abdominal:     General: Bowel sounds are normal.  Musculoskeletal:        General: Normal range of motion.  Skin:    General: Skin is warm.     Findings: No rash.  Neurological:     Mental Status: She is alert and oriented to person, place, and time.  Psychiatric:        Behavior: Behavior is cooperative.     BP (!) 152/38    Pulse (!) 55    Temp 98.3 F (36.8 C) (Temporal)    Ht 5\' 2"  (1.575 m)    Wt 159 lb (72.1 kg)    BMI 29.08 kg/m  Wt Readings from Last 3 Encounters:  05/09/21 159 lb (72.1 kg)  05/08/21 158 lb (71.7 kg)  05/08/21 157 lb (71.2 kg)     Health Maintenance Due  Topic Date Due   Pneumonia Vaccine 38+ Years old (1 - PCV) Never done   Zoster Vaccines- Shingrix (1 of 2) Never done   DEXA SCAN  12/11/2018    There are no preventive care reminders to display for this patient.  Lab Results  Component Value Date   TSH 2.840 02/12/2020   Lab Results  Component Value Date   WBC 8.0 05/08/2021   HGB 12.0 05/08/2021   HCT 36.8 05/08/2021   MCV 93.9 05/08/2021   PLT 208 05/08/2021   Lab Results  Component Value Date   NA 139 05/08/2021   K 4.7 05/08/2021   CO2 26 05/08/2021   GLUCOSE 101 (H) 05/08/2021   BUN 12 05/08/2021   CREATININE 0.82 05/08/2021   BILITOT 0.4 05/08/2021   ALKPHOS 54 05/08/2021   AST 18 05/08/2021   ALT 19 05/08/2021   PROT 6.5 05/08/2021   ALBUMIN 3.7 05/08/2021   CALCIUM 9.6 05/08/2021   ANIONGAP 9 05/08/2021   Lab Results  Component Value Date   CHOL 145 05/25/2017   Lab Results  Component Value Date   HDL 47 05/25/2017   Lab Results  Component Value Date   LDLCALC 71 05/25/2017   Lab Results  Component Value Date   TRIG 136 05/25/2017   Lab Results  Component Value Date   CHOLHDL 3.1 05/25/2017   Lab Results  Component Value Date   HGBA1C  5.7 (H) 10/15/2017      Assessment & Plan:   Problem List Items Addressed This Visit       Cardiovascular and Mediastinum   Essential hypertension    Blood pressure is not at goal with current medication. Patient blood pressure values are different from values at home. Patient will schedule cardiology appointment in the next week. Patient was evaluated in the ED 24 hours for elevated blood pressure and diagnosed with acute pancreatitis.. patient is scheduled  Next week  with GI.    Patient knows to follow up with worsening unresolved symptoms.        Other   Hospital discharge follow-up - Primary    Patient follow up after ED visit for uncontrolled blood pressure. Symptoms are resolving gradually, completed ED discharge instructions, follow up visits and medication reconciliation.         No orders of the defined types were placed in this encounter.   Follow-up: Return if symptoms worsen or fail to improve.    Ivy Lynn, NP

## 2021-05-12 ENCOUNTER — Encounter: Payer: Self-pay | Admitting: Cardiovascular Disease

## 2021-05-12 ENCOUNTER — Ambulatory Visit: Payer: Medicare Other | Admitting: Cardiovascular Disease

## 2021-05-12 ENCOUNTER — Other Ambulatory Visit: Payer: Self-pay

## 2021-05-12 VITALS — BP 156/54 | HR 64 | Ht 62.0 in | Wt 158.0 lb

## 2021-05-12 DIAGNOSIS — I10 Essential (primary) hypertension: Secondary | ICD-10-CM | POA: Diagnosis not present

## 2021-05-12 DIAGNOSIS — E782 Mixed hyperlipidemia: Secondary | ICD-10-CM

## 2021-05-12 DIAGNOSIS — Z952 Presence of prosthetic heart valve: Secondary | ICD-10-CM | POA: Diagnosis not present

## 2021-05-12 MED ORDER — FUROSEMIDE 20 MG PO TABS: 20.0000 mg | ORAL_TABLET | Freq: Every day | ORAL | 3 refills | Status: DC

## 2021-05-12 MED ORDER — LOSARTAN POTASSIUM 100 MG PO TABS: 100.0000 mg | ORAL_TABLET | Freq: Every day | ORAL | 3 refills | Status: DC

## 2021-05-12 NOTE — Patient Instructions (Signed)
Medication Instructions:  Your physician has recommended you make the following change in your medication:  1- INCREASE Losartan (Cozaar) 100 mg by mouth daily.  Please take your Metoprolol and Losartan in the AM                             Furosemide at Lunch                             Metoprolol in the PM  *If you need a refill on your cardiac medications before your next appointment, please call your pharmacy*  Lab Work: If you have labs (blood work) drawn today and your tests are completely normal, you will receive your results only by: [place] (if you have MyChart) OR A paper copy in the mail If you have any lab test that is abnormal or we need to change your treatment, we will call you to review the results.  Testing/Procedures: None ordered today.  Follow-Up: At [facility], you and your health needs are our priority.  As part of our continuing mission to provide you with exceptional heart care, we have created designated Provider Care Teams.  These Care Teams include your primary Cardiologist (physician) and Advanced Practice Providers (APPs -  Physician Assistants and Nurse Practitioners) who all work together to provide you with the care you need, when you need it.  We recommend signing up for the patient portal called "MyChart".  Sign up information is provided on this After Visit Summary.  MyChart is used to connect with patients for Virtual Visits (Telemedicine).  Patients are able to view lab/test results, encounter notes, upcoming appointments, etc.  Non-urgent messages can be sent to your provider as well.   To learn more about what you can do with MyChart, go to [email or web address].    Your next appointment:   6 month(s)  The format for your next appointment:   In Person  Provider:   Jenkins Rouge, MD {

## 2021-05-16 ENCOUNTER — Encounter: Payer: Self-pay | Admitting: Nurse Practitioner

## 2021-05-16 ENCOUNTER — Ambulatory Visit (INDEPENDENT_AMBULATORY_CARE_PROVIDER_SITE_OTHER): Payer: Medicare Other | Admitting: Nurse Practitioner

## 2021-05-16 VITALS — BP 151/44 | HR 56 | Temp 98.8°F | Ht 62.0 in | Wt 157.0 lb

## 2021-05-16 DIAGNOSIS — M5441 Lumbago with sciatica, right side: Secondary | ICD-10-CM | POA: Diagnosis not present

## 2021-05-16 DIAGNOSIS — M5442 Lumbago with sciatica, left side: Secondary | ICD-10-CM

## 2021-05-16 NOTE — Patient Instructions (Signed)

## 2021-05-16 NOTE — Progress Notes (Signed)
Acute Office Visit  Subjective:    Patient ID: [name], female    DOB: 07-Nov-1935, 86 y.o.   MRN: [id number]  Chief Complaint  Patient presents with   hip/back pain    Needs referral    HPI Patient is in today for follow-up on bilateral lower back pain.  Symptoms are chronic and getting worse managed with current medication.  Patient is requesting a referral to orthopedic.  Education provided to patient.  And referral completed.  Past Medical History:  Diagnosis Date   Chronic headaches    Fibromyalgia    GERD (gastroesophageal reflux disease)    Hemorrhoids    INTERNAL--  POST BANDING 02/ 2015   History of kidney stones    Hyperlipidemia    Hypertension    Moderate aortic stenosis    OSA (obstructive sleep apnea) CPAP INTOLERANT    Osteoporosis    S/P TAVR (transcatheter aortic valve replacement) 10/19/2017   20 mm Edwards Sapien 3 transcatheter heart valve placed via percutaneous right transfemoral approach    Severe aortic stenosis    Spinal stenosis, multilevel     Past Surgical History:  Procedure Laterality Date   ANTERIOR CERVICAL DECOMP/DISCECTOMY FUSION  11-11-1999   C5 - C6   APPENDECTOMY  AGE 55   CARDIAC CATHETERIZATION  2008  DR NISHAN   ESSENTIALLY NORMAL   CATARACT EXTRACTION Bilateral    CATARACT EXTRACTION W/ INTRAOCULAR LENS  IMPLANT, BILATERAL  2012   EYE SURGERY     FLEXIBLE SIGMOIDOSCOPY N/A 06/01/2013   Procedure: FLEXIBLE SIGMOIDOSCOPY;  Surgeon: Inda Castle, MD;  Location: [facility];  Service: Endoscopy;  Laterality: N/A;  may need hemorrhoidal banding   HEMORRHOIDECTOMY WITH HEMORRHOID BANDING  08-07-2010   IR RADIOLOGY PERIPHERAL GUIDED IV START  06/27/2019   IR US GUIDE VASC ACCESS RIGHT  06/27/2019   LAPAROSCOPIC CHOLECYSTECTOMY  1990   RIGHT/LEFT HEART CATH AND CORONARY ANGIOGRAPHY N/A 09/29/2017   Procedure: RIGHT/LEFT HEART CATH AND CORONARY ANGIOGRAPHY;  Surgeon: Sherren Mocha, MD;  Location: [place] [facility];  Service:  Cardiovascular;  Laterality: N/A;   SHOULDER ARTHROSCOPY WITH OPEN ROTATOR CUFF REPAIR AND DISTAL CLAVICLE ACROMINECTOMY Right 05/25/2012   Procedure: SHOULDER ARTHROSCOPY WITH OPEN ROTATOR CUFF REPAIR AND DISTAL CLAVICLE ACROMINECTOMY;  Surgeon: Magnus Sinning, MD;  Location: [place];  Service: Orthopedics;  Laterality: Right;  RIGHT SHOULDER ARTHROSCOPY WITH DERBRIDEMENTOF LABRAL/BICEP TENDON, OPEN DISTAL CLAVICLE RESECTION, ANTERIOR ACROMINECTOMY ROTATOR CUFF REPAIR ANESTHESIA: GENERAL/SCALENE NERVE BLOCK   TEE WITHOUT CARDIOVERSION Bilateral 10/19/2017   Procedure: TRANSESOPHAGEAL ECHOCARDIOGRAM (TEE);  Surgeon: Sherren Mocha, MD;  Location: [place];  Service: Open Heart Surgery;  Laterality: Bilateral;   TENOSYNOVECTOMY Left 01/04/2014   Procedure: LEFT WRIST EXTENSOR TENOSYNOVECTOMY;  Surgeon: Linna Hoff, MD;  Location: [facility];  Service: Orthopedics;  Laterality: Left;   THYROIDECTOMY  AGE 73   GOITER   TONSILLECTOMY AND ADENOIDECTOMY  AGE 29   TRANSCATHETER AORTIC VALVE REPLACEMENT, TRANSFEMORAL  10/19/2017   TRANSCATHETER AORTIC VALVE REPLACEMENT, TRANSFEMORAL Bilateral 10/19/2017   Procedure: TRANSCATHETER AORTIC VALVE REPLACEMENT, TRANSFEMORAL;  Surgeon: Sherren Mocha, MD;  Location: [place];  Service: Open Heart Surgery;  Laterality: Bilateral;   TRANSTHORACIC ECHOCARDIOGRAM  last one 04-20-2013  DR [facility]    NORMAL LVSF/ EF 60-65%/ MODERATE  AV  STENOSIS WITH NO AR /  MILD LAE   UMBILICAL HERNIA REPAIR  JUNE 2006   VAGINAL HYSTERECTOMY  01-31-2001   ANTERIOR & POSTERIOR REPAIR/ TRANSVAGINAL  BLADDER SLING    Family History  Problem Relation Age of Onset   Cirrhosis Mother        drinking   Other Brother        duodenal ulcer   Heart disease Sister    Cancer Son 26       colon   Gallbladder disease Maternal Grandmother    Stomach cancer Paternal Grandfather     Social History   Socioeconomic History   Marital status: Married     Spouse name: Shanon Brow   Number of children: 6   Years of education: 6   Highest education level: 6th grade  Occupational History   Occupation: Retired  Tobacco Use   Smoking status: Former    Packs/day: 0.25    Years: 5.00    Pack years: 1.25    Types: Cigarettes    Quit date: 04/14/2003    Years since quitting: 18.1   Smokeless tobacco: Never  Vaping Use   Vaping Use: Never used  Substance and Sexual Activity   Alcohol use: Yes    Comment: once in a while-social   Drug use: Never    Comment: hemp oil    Sexual activity: Not Currently  Other Topics Concern   Not on file  Social History Narrative   Not on file   Social Determinants of Health   Financial Resource Strain: Not on file  Food Insecurity: Not on file  Transportation Needs: Not on file  Physical Activity: Not on file  Stress: Not on file  Social Connections: Not on file  Intimate Partner Violence: Not on file    Outpatient Medications Prior to Visit  Medication Sig Dispense Refill   acetaminophen (TYLENOL) 500 MG tablet Take 1,000 mg by mouth daily as needed for moderate pain or headache.     ALOE VERA PO Take 1 capsule by mouth daily.     Ascorbic Acid (VITAMIN C) 1000 MG tablet Take 1,000 mg by mouth daily.     aspirin EC 81 MG tablet Take 1 tablet (81 mg total) by mouth daily. 90 tablet 3   BEE POLLEN PO Take 400 mg by mouth daily.     clobetasol cream (TEMOVATE) 0.93 % APPLY 1 APPLICATION TOPICALLY 2 (TWO) TIMES DAILY AS NEEDED. 30 g 3   Coenzyme Q10 (COQ-10) 100 MG CAPS Take 100 mg by mouth daily.     furosemide (LASIX) 20 MG tablet Take 1 tablet (20 mg total) by mouth daily. 90 tablet 3   losartan (COZAAR) 100 MG tablet Take 1 tablet (100 mg total) by mouth daily. 90 tablet 3   metoprolol tartrate (LOPRESSOR) 50 MG tablet TAKE 1 TABLET BY MOUTH TWICE A DAY 180 tablet 2   Misc Natural Products (TART CHERRY ADVANCED PO) Take by mouth daily.     mupirocin ointment (BACTROBAN) 2 % Apply to the affected area  twice daily for 7-10 days 22 g 0   OVER THE COUNTER MEDICATION Take 1 capsule by mouth 2 (two) times daily. Hemp Oil     rosuvastatin (CRESTOR) 5 MG tablet Take 1 tablet (5 mg total) by mouth daily. 90 tablet 3   UNABLE TO FIND Med Name: HEMP- Tumeric cream / RUB     No facility-administered medications prior to visit.    Allergies  Allergen Reactions   Tape Other (See Comments)    Dermatitis rash "with extended exposure"   Prolia [Denosumab] Other (See Comments)    Arthralgia/ myalgia/ jaw pain/ headache  Review of Systems  Constitutional: Negative.   HENT: Negative.    Gastrointestinal: Negative.   Genitourinary: Negative.   Musculoskeletal:  Positive for back pain.  All other systems reviewed and are negative.     Objective:    Physical Exam Vitals reviewed.  Constitutional:      Appearance: Normal appearance.  HENT:     Right Ear: External ear normal.     Left Ear: External ear normal.     Mouth/Throat:     Mouth: Mucous membranes are moist.     Pharynx: Oropharynx is clear.  Eyes:     Conjunctiva/sclera: Conjunctivae normal.  Cardiovascular:     Pulses: Normal pulses.  Abdominal:     General: Bowel sounds are normal.  Musculoskeletal:     Lumbar back: Tenderness present. Decreased range of motion.  Skin:    Coloration: Skin is pale.     Findings: No rash.  Neurological:     Mental Status: She is alert and oriented to person, place, and time.    BP (!) 151/44    Pulse (!) 56    Temp 98.8 F (37.1 C)    Ht 5\' 2"  (1.575 m)    Wt 157 lb (71.2 kg)    SpO2 97%    BMI 28.72 kg/m  Wt Readings from Last 3 Encounters:  05/16/21 157 lb (71.2 kg)  05/12/21 158 lb (71.7 kg)  05/09/21 159 lb (72.1 kg)    Health Maintenance Due  Topic Date Due   Pneumonia Vaccine 25+ Years old (1 - PCV) Never done   Zoster Vaccines- Shingrix (1 of 2) Never done   DEXA SCAN  12/11/2018    There are no preventive care reminders to display for this patient.   Lab Results   Component Value Date   TSH 2.840 02/12/2020   Lab Results  Component Value Date   WBC 8.0 05/08/2021   HGB 12.0 05/08/2021   HCT 36.8 05/08/2021   MCV 93.9 05/08/2021   PLT 208 05/08/2021   Lab Results  Component Value Date   NA 139 05/08/2021   K 4.7 05/08/2021   CO2 26 05/08/2021   GLUCOSE 101 (H) 05/08/2021   BUN 12 05/08/2021   CREATININE 0.82 05/08/2021   BILITOT 0.4 05/08/2021   ALKPHOS 54 05/08/2021   AST 18 05/08/2021   ALT 19 05/08/2021   PROT 6.5 05/08/2021   ALBUMIN 3.7 05/08/2021   CALCIUM 9.6 05/08/2021   ANIONGAP 9 05/08/2021   Lab Results  Component Value Date   CHOL 145 05/25/2017   Lab Results  Component Value Date   HDL 47 05/25/2017   Lab Results  Component Value Date   LDLCALC 71 05/25/2017   Lab Results  Component Value Date   TRIG 136 05/25/2017   Lab Results  Component Value Date   CHOLHDL 3.1 05/25/2017   Lab Results  Component Value Date   HGBA1C 5.7 (H) 10/15/2017       Assessment & Plan:  Symptoms not well controlled.  Completed patient request for referral to orthopedic.  Continue current symptom management.  Follow-up with worsening unresolved symptoms. Problem List Items Addressed This Visit       Nervous and Auditory   Bilateral low back pain with sciatica - Primary     No orders of the defined types were placed in this encounter.    Ivy Lynn, NP

## 2021-05-18 ENCOUNTER — Other Ambulatory Visit: Payer: Self-pay | Admitting: Family Medicine

## 2021-05-26 ENCOUNTER — Other Ambulatory Visit (INDEPENDENT_AMBULATORY_CARE_PROVIDER_SITE_OTHER): Payer: Medicare Other

## 2021-05-26 ENCOUNTER — Encounter: Payer: Self-pay | Admitting: Gastroenterology

## 2021-05-26 ENCOUNTER — Ambulatory Visit: Payer: Medicare Other | Admitting: Gastroenterology

## 2021-05-26 VITALS — BP 140/62 | HR 69 | Ht 62.0 in | Wt 158.0 lb

## 2021-05-26 DIAGNOSIS — R748 Abnormal levels of other serum enzymes: Secondary | ICD-10-CM

## 2021-05-26 DIAGNOSIS — R1310 Dysphagia, unspecified: Secondary | ICD-10-CM | POA: Diagnosis not present

## 2021-05-26 NOTE — Patient Instructions (Addendum)
Your provider has requested that you go to the basement level for lab work before leaving today. Press "B" on the elevator. The lab is located at the first door on the left as you exit the elevator.  Continue regimen to keep bowels moving.   You have been scheduled for a Barium Esophogram at [facility] (1st floor of the hospital) on Friday 06/06/21 at 9:30 am. Please arrive 15 minutes prior to your appointment for registration. Make certain not to have anything to eat or drink 3 hours prior to your test. If you need to reschedule for any reason, please contact radiology at [phone] to do so. __________________________________________________________________ A barium swallow is an examination that concentrates on views of the esophagus. This tends to be a double contrast exam (barium and two liquids which, when combined, create a gas to distend the wall of the oesophagus) or single contrast (non-ionic iodine based). The study is usually tailored to your symptoms so a good history is essential. Attention is paid during the study to the form, structure and configuration of the esophagus, looking for functional disorders (such as aspiration, dysphagia, achalasia, motility and reflux) EXAMINATION You may be asked to change into a gown, depending on the type of swallow being performed. A radiologist and radiographer will perform the procedure. The radiologist will advise you of the type of contrast selected for your procedure and direct you during the exam. You will be asked to stand, sit or lie in several different positions and to hold a small amount of fluid in your mouth before being asked to swallow while the imaging is performed .In some instances you may be asked to swallow barium coated marshmallows to assess the motility of a solid food bolus. The exam can be recorded as a digital or video fluoroscopy procedure. POST PROCEDURE It will take 1-2 days for the barium to pass through your  system. To facilitate this, it is important, unless otherwise directed, to increase your fluids for the next 24-48hrs and to resume your normal diet.  This test typically takes about 30 minutes to perform. ___________________________________________________________________

## 2021-05-26 NOTE — Progress Notes (Signed)
05/26/2021 [name] [id number] Apr 28, 1935   HISTORY OF PRESENT ILLNESS: This is an 86 year old female who is here for follow-up from an ED visit back in January.  She went to the ED with complaints of right-sided abdominal pain that radiated around to the right side of her back.  Labs were unremarkable except for an elevated lipase at 102.  CT scan of the abdomen and pelvis with contrast:  IMPRESSION: 1. No   acute localizing process in the abdomen or pelvis. 2. Colonic diverticulosis without evidence for acute diverticulitis. 3. Bilateral renal cysts and complex cysts. 4.  Aortic Atherosclerosis (ICD10-I70.0).  Specifically the pancreas was unremarkable.  She says that the pain really has been about resolved since the ED visit.  She tends to struggle with constipation and notes that if she has any type of abdominal discomfort that it does seem to improve after having a bowel movement.  She likes to take natural things to treat herself and her constipation and uses occasional Dulcolax.  She does not have a gallbladder.  While she is here she also mentions that on a rare occasion here and there she will have issues with solid food getting stuck when she swallows.  The only EGD that I see was in 2003 which time she had gastritis.  She says that she does not get much heartburn, but if she eats something that does not seem to give her heartburn then she will take some baking soda and that works well.   Past Medical History:  Diagnosis Date   Chronic headaches    Fibromyalgia    GERD (gastroesophageal reflux disease)    Hemorrhoids    INTERNAL--  POST BANDING 02/ 2015   History of kidney stones    Hyperlipidemia    Hypertension    Moderate aortic stenosis    OSA (obstructive sleep apnea) CPAP INTOLERANT    Osteoporosis    S/P TAVR (transcatheter aortic valve replacement) 10/19/2017   20 mm Edwards Sapien 3 transcatheter heart valve placed via percutaneous right transfemoral  approach    Severe aortic stenosis    Spinal stenosis, multilevel    Past Surgical History:  Procedure Laterality Date   ANTERIOR CERVICAL DECOMP/DISCECTOMY FUSION  11-11-1999   C5 - C6   APPENDECTOMY  AGE 48   CARDIAC CATHETERIZATION  2008  DR NISHAN   ESSENTIALLY NORMAL   CATARACT EXTRACTION Bilateral    CATARACT EXTRACTION W/ INTRAOCULAR LENS  IMPLANT, BILATERAL  2012   EYE SURGERY     FLEXIBLE SIGMOIDOSCOPY N/A 06/01/2013   Procedure: FLEXIBLE SIGMOIDOSCOPY;  Surgeon: Inda Castle, MD;  Location: [facility];  Service: Endoscopy;  Laterality: N/A;  may need hemorrhoidal banding   HEMORRHOIDECTOMY WITH HEMORRHOID BANDING  08-07-2010   IR RADIOLOGY PERIPHERAL GUIDED IV START  06/27/2019   IR US GUIDE VASC ACCESS RIGHT  06/27/2019   LAPAROSCOPIC CHOLECYSTECTOMY  1990   RIGHT/LEFT HEART CATH AND CORONARY ANGIOGRAPHY N/A 09/29/2017   Procedure: RIGHT/LEFT HEART CATH AND CORONARY ANGIOGRAPHY;  Surgeon: Sherren Mocha, MD;  Location: [place] [facility];  Service: Cardiovascular;  Laterality: N/A;   SHOULDER ARTHROSCOPY WITH OPEN ROTATOR CUFF REPAIR AND DISTAL CLAVICLE ACROMINECTOMY Right 05/25/2012   Procedure: SHOULDER ARTHROSCOPY WITH OPEN ROTATOR CUFF REPAIR AND DISTAL CLAVICLE ACROMINECTOMY;  Surgeon: Magnus Sinning, MD;  Location: [place];  Service: Orthopedics;  Laterality: Right;  RIGHT SHOULDER ARTHROSCOPY WITH DERBRIDEMENTOF LABRAL/BICEP TENDON, OPEN DISTAL CLAVICLE RESECTION, ANTERIOR ACROMINECTOMY ROTATOR CUFF REPAIR  ANESTHESIA: GENERAL/SCALENE NERVE BLOCK   TEE WITHOUT CARDIOVERSION Bilateral 10/19/2017   Procedure: TRANSESOPHAGEAL ECHOCARDIOGRAM (TEE);  Surgeon: Sherren Mocha, MD;  Location: [place];  Service: Open Heart Surgery;  Laterality: Bilateral;   TENOSYNOVECTOMY Left 01/04/2014   Procedure: LEFT WRIST EXTENSOR TENOSYNOVECTOMY;  Surgeon: Linna Hoff, MD;  Location: [facility];  Service: Orthopedics;  Laterality: Left;    THYROIDECTOMY  AGE 4   GOITER   TONSILLECTOMY AND ADENOIDECTOMY  AGE 32   TRANSCATHETER AORTIC VALVE REPLACEMENT, TRANSFEMORAL  10/19/2017   TRANSCATHETER AORTIC VALVE REPLACEMENT, TRANSFEMORAL Bilateral 10/19/2017   Procedure: TRANSCATHETER AORTIC VALVE REPLACEMENT, TRANSFEMORAL;  Surgeon: Sherren Mocha, MD;  Location: [place];  Service: Open Heart Surgery;  Laterality: Bilateral;   TRANSTHORACIC ECHOCARDIOGRAM  last one 04-20-2013  DR [facility]    NORMAL LVSF/ EF 60-65%/ MODERATE  AV  STENOSIS WITH NO AR /  MILD LAE   UMBILICAL HERNIA REPAIR  JUNE 2006   VAGINAL HYSTERECTOMY  01-31-2001   ANTERIOR & POSTERIOR REPAIR/ TRANSVAGINAL BLADDER SLING    reports that she quit smoking about 18 years ago. Her smoking use included cigarettes. She has a 1.25 pack-year smoking history. She has never used smokeless tobacco. She reports current alcohol use. She reports that she does not use drugs. family history includes Cancer (age of onset: 87) in her son; Cirrhosis in her mother; Gallbladder disease in her maternal grandmother; Heart disease in her sister; Other in her brother; Stomach cancer in her paternal grandfather. Allergies  Allergen Reactions   Tape Other (See Comments)    Dermatitis rash "with extended exposure"   Prolia [Denosumab] Other (See Comments)    Arthralgia/ myalgia/ jaw pain/ headache      Outpatient Encounter Medications as of 05/26/2021  Medication Sig   acetaminophen (TYLENOL) 500 MG tablet Take 1,000 mg by mouth daily as needed for moderate pain or headache.   ALOE VERA PO Take 1 capsule by mouth daily.   Ascorbic Acid (VITAMIN C) 1000 MG tablet Take 1,000 mg by mouth daily.   aspirin EC 81 MG tablet Take 1 tablet (81 mg total) by mouth daily.   BEE POLLEN PO Take 400 mg by mouth daily.   clobetasol cream (TEMOVATE) 0.45 % APPLY 1 APPLICATION TOPICALLY 2 (TWO) TIMES DAILY AS NEEDED.   Coenzyme Q10 (COQ-10) 100 MG CAPS Take 100 mg by mouth daily.   furosemide (LASIX) 20 MG  tablet Take 1 tablet (20 mg total) by mouth daily.   losartan (COZAAR) 100 MG tablet Take 1 tablet (100 mg total) by mouth daily.   metoprolol tartrate (LOPRESSOR) 50 MG tablet TAKE 1 TABLET BY MOUTH TWICE A DAY   Misc Natural Products (TART CHERRY ADVANCED PO) Take by mouth daily.   mupirocin ointment (BACTROBAN) 2 % Apply to the affected area twice daily for 7-10 days   OVER THE COUNTER MEDICATION Take 1 capsule by mouth 2 (two) times daily. Hemp Oil   rosuvastatin (CRESTOR) 5 MG tablet Take 1 tablet (5 mg total) by mouth daily. (NEEDS TO BE SEEN BEFORE NEXT REFILL)   UNABLE TO FIND Med Name: HEMP- Tumeric cream / RUB   No facility-administered encounter medications on file as of 05/26/2021.     REVIEW OF SYSTEMS  : All other systems reviewed and negative except where noted in the History of Present Illness.   PHYSICAL EXAM: BP 140/62    Pulse 69    Ht 5\' 2"  (1.575 m)    Wt 158 lb (71.7  kg)    BMI 28.90 kg/m  General: Well developed female in no acute distress Head: Normocephalic and atraumatic Eyes:  Sclerae anicteric, conjunctiva pink. Ears: Normal auditory acuity Lungs: Clear throughout to auscultation; no W/R/R. Heart: Regular rate and rhythm; SEM noted. Abdomen: Soft, non-distended.  BS present.  Non-tender. Musculoskeletal: Symmetrical with no gross deformities  Skin: No lesions on visible extremities Extremities: No edema  Neurological: Alert oriented x 4, grossly non-focal Psychological:  Alert and cooperative. Normal mood and affect  ASSESSMENT AND PLAN: *Right upper quadrant abdominal pain: Went to the ED for this in January.  About resolved at this point, and really resolved since the ED visit.  Lipase was slightly elevated at 102, but pancreas looked normal on CT scan.  CT scan unremarkable for any other causes of her pain.  And other labs looked fine.  She struggles with constipation.  Wonder if this could have been related to some constipation.  She was advised to keep  her bowels moving well.  We will recheck a lipase level today. *Dysphagia: Occasional dysphagia to solid food.  She says it happens rarely here and there.  We will start with an esophagram to rule out stricture versus motility issue, etc.   CC:  [name], DO

## 2021-05-27 LAB — LIPASE: Lipase: 58 U/L (ref 11.0–59.0)

## 2021-06-02 ENCOUNTER — Ambulatory Visit: Payer: Self-pay

## 2021-06-02 ENCOUNTER — Other Ambulatory Visit: Payer: Self-pay

## 2021-06-02 ENCOUNTER — Encounter: Payer: Self-pay | Admitting: Orthopedic Surgery

## 2021-06-02 ENCOUNTER — Telehealth: Payer: Self-pay

## 2021-06-02 ENCOUNTER — Ambulatory Visit: Payer: Medicare Other | Admitting: Orthopedic Surgery

## 2021-06-02 DIAGNOSIS — M79605 Pain in left leg: Secondary | ICD-10-CM | POA: Diagnosis not present

## 2021-06-02 DIAGNOSIS — M25551 Pain in right hip: Secondary | ICD-10-CM | POA: Diagnosis not present

## 2021-06-02 DIAGNOSIS — M1711 Unilateral primary osteoarthritis, right knee: Secondary | ICD-10-CM

## 2021-06-02 DIAGNOSIS — M545 Low back pain, unspecified: Secondary | ICD-10-CM

## 2021-06-02 DIAGNOSIS — M79604 Pain in right leg: Secondary | ICD-10-CM

## 2021-06-02 DIAGNOSIS — M25552 Pain in left hip: Secondary | ICD-10-CM | POA: Diagnosis not present

## 2021-06-02 MED ORDER — LIDOCAINE HCL 1 % IJ SOLN
5.0000 mL | INTRAMUSCULAR | Status: AC | PRN
  Administered 2021-06-02: 5 mL

## 2021-06-02 MED ORDER — METHYLPREDNISOLONE ACETATE 40 MG/ML IJ SUSP
40.0000 mg | INTRAMUSCULAR | Status: AC | PRN
  Administered 2021-06-02: 40 mg via INTRA_ARTICULAR

## 2021-06-02 MED ORDER — BUPIVACAINE HCL 0.25 % IJ SOLN
4.0000 mL | INTRAMUSCULAR | Status: AC | PRN
  Administered 2021-06-02: 4 mL via INTRA_ARTICULAR

## 2021-06-02 NOTE — Telephone Encounter (Signed)
Need auth for right knee gel injection °

## 2021-06-02 NOTE — Progress Notes (Signed)
Office Visit Note   Patient: [name]           Date of Birth: 16-Apr-1935           MRN: [id number] Visit Date: 06/02/2021 Requested by: Ivy Lynn, NP [place],  [place] [address] PCP: [name], DO  Subjective: Chief Complaint  Patient presents with   Other    Bilateral hip/knee/leg pain    HPI: Patient presents for evaluation of back and hip pain.  She states "I have been hurting forever".  She has a history of fibromyalgia.  Reports pain after sitting.  Some difficulty with walking.  At times it is hard for her to get around.  Reports pain in bilateral legs hips and knees.  Has had a history of cervical spine surgery.  Never had lumbar spine surgery.  She states at times her legs give way.  The pain does not wake her from sleep.  Has started taking magnesium with some relief over the past 2 weeks.  She has a history of heart issues.  She also takes calcium and vitamin D.              ROS: All systems reviewed are negative as they relate to the chief complaint within the history of present illness.  Patient denies  fevers or chills.   Assessment & Plan: Visit Diagnoses:  1. Bilateral leg pain     Plan: Impression is lumbar spine scoliosis with significant degenerative disc disease throughout the lumbar spine which could be causing some of her leg symptoms.  Does not really sound classically like spinal stenosis symptoms.  This is something should get worse we could consider MRI scanning and injections for in her back.  She has mild hip pain but that is more trochanteric and back related.  Denies much in the way of groin pain.  Radiographs on this front look pretty reasonable and her exam shows excellent range of motion as well.  Regarding her knees she has significant end-stage arthritis in the right knee but does not really want to think about knee replacement.  She feels like she is to hold and has too many medical risk factors which I think is  a fairly accurate assessment.  We will try injection into the right knee today and preapproved her for gel injection to be done in 8 weeks.  MRI scan from 2013 reviewed and it did show similar but not as advanced degenerative changes in the lumbar spine.  Follow-Up Instructions: Return in about 8 weeks (around 07/28/2021).   Orders:  Orders Placed This Encounter  Procedures   XR Lumbar Spine 2-3 Views   XR HIPS BILAT W OR W/O PELVIS 3-4 VIEWS   XR Knee 1-2 Views Right   XR Knee 1-2 Views Left   No orders of the defined types were placed in this encounter.     Procedures: Large Joint Inj: R knee on 06/02/2021 8:34 PM Indications: diagnostic evaluation, joint swelling and pain Details: 18 G 1.5 in needle, superolateral approach  Arthrogram: No  Medications: 5 mL lidocaine 1 %; 40 mg methylPREDNISolone acetate 40 MG/ML; 4 mL bupivacaine 0.25 % Outcome: tolerated well, no immediate complications Procedure, treatment alternatives, risks and benefits explained, specific risks discussed. Consent was given by the patient. Immediately prior to procedure a time out was called to verify the correct patient, procedure, equipment, support staff and site/side marked as required. Patient was prepped and draped in the  usual sterile fashion.      Clinical Data: No additional findings.  Objective: Vital Signs: There were no vitals taken for this visit.  Physical Exam:   Constitutional: Patient appears well-developed HEENT:  Head: Normocephalic Eyes:EOM are normal Neck: Normal range of motion Cardiovascular: Normal rate Pulmonary/chest: Effort normal Neurologic: Patient is alert Skin: Skin is warm Psychiatric: Patient has normal mood and affect   Ortho Exam: Ortho exam demonstrates palpable pedal pulses with good ankle dorsiflexion plantarflexion quad and hamstring strength.  No effusion in either knee.  No muscle atrophy in either leg.  No definite paresthesias L1 S1 bilaterally.   Negative clonus.  No nerve root tension signs.  No trochanteric tenderness and no groin pain with internal/external rotation of either leg.  Bilateral knees are examined.  Much more tenderness to palpation around the right knee compared to the left.  Extensor mechanism is intact.  No other masses lymphadenopathy or skin changes noted in the knee or back or hip region.  Specialty Comments:  No specialty comments available.  Imaging: XR HIPS BILAT W OR W/O PELVIS 3-4 VIEWS  Result Date: 06/02/2021 AP pelvis bilateral lateral hip radiographs reviewed.  No acute fracture.  Mild degenerative changes noted without significant joint space narrowing.  Some pelvic tilt is present associated with lumbar scoliosis.  XR Knee 1-2 Views Left  Result Date: 06/02/2021 AP lateral radiographs left knee reviewed.  Alignment intact.  No acute fracture.  Minimal degenerative joint changes present primarily in the medial compartment but no bone-on-bone changes present.  No spurring or osteophyte formation.  XR Knee 1-2 Views Right  Result Date: 06/02/2021 AP lateral radiographs right knee reviewed.  Slight valgus alignment noted.  End-stage lateral compartment arthritis is present with lesser changes in the medial and patellofemoral compartments.  No acute fracture.  Patella height normal relative to distal femur  XR Lumbar Spine 2-3 Views  Result Date: 06/02/2021 AP lateral radiographs lumbar spine reviewed.  Mild scoliosis is present in the lumbar spine.  Significant degenerative disc disease with joint space narrowing is present diffusely and symmetrically throughout the lumbar spine.  Accompanying facet arthritis also present.  No spondylolisthesis or compression fractures.  Some calcification of the aorta is present.    PMFS History: Patient Active Problem List   Diagnosis Date Noted   Dysphagia 05/26/2021   Elevated lipase 05/26/2021   Hospital discharge follow-up 05/09/2021   Bilateral low back pain  with sciatica 09/19/2019   Bilateral lower extremity edema 08/14/2019   S/P TAVR (transcatheter aortic valve replacement) 10/19/2017   Chronic fatigue 08/25/2017   DDD (degenerative disc disease), cervical 07/08/2017   Bursitis of left shoulder 07/08/2017   Fibromyalgia 07/08/2017   Atherosclerosis of abdominal aorta ([place]) 06/29/2017   Mixed hyperlipidemia 03/19/2017   Osteoporosis 04/21/2016   Internal hemorrhoids with other complication 26/94/8546   GI bleed 05/30/2013   NSAID long-term use 05/30/2013   Severe aortic stenosis 05/29/2013   DIVERTICULOSIS-COLON 01/13/2010   CONSTIPATION 01/13/2010   PERSONAL HISTORY OF COLONIC POLYPS 01/13/2010   Essential hypertension 08/28/2008   GERD 08/28/2008   PALPITATIONS 08/28/2008   Past Medical History:  Diagnosis Date   Chronic headaches    Fibromyalgia    GERD (gastroesophageal reflux disease)    Hemorrhoids    INTERNAL--  POST BANDING 02/ 2015   History of kidney stones    Hyperlipidemia    Hypertension    Moderate aortic stenosis    OSA (obstructive sleep apnea) CPAP INTOLERANT  Osteoporosis    S/P TAVR (transcatheter aortic valve replacement) 10/19/2017   20 mm Edwards Sapien 3 transcatheter heart valve placed via percutaneous right transfemoral approach    Severe aortic stenosis    Spinal stenosis, multilevel     Family History  Problem Relation Age of Onset   Cirrhosis Mother        drinking   Other Brother        duodenal ulcer   Heart disease Sister    Cancer Son 83       colon   Gallbladder disease Maternal Grandmother    Stomach cancer Paternal Grandfather     Past Surgical History:  Procedure Laterality Date   ANTERIOR CERVICAL DECOMP/DISCECTOMY FUSION  11-11-1999   C5 - C6   APPENDECTOMY  AGE 69   CARDIAC CATHETERIZATION  2008  DR [place]   ESSENTIALLY NORMAL   CATARACT EXTRACTION Bilateral    CATARACT EXTRACTION W/ INTRAOCULAR LENS  IMPLANT, BILATERAL  2012   EYE SURGERY     FLEXIBLE SIGMOIDOSCOPY  N/A 06/01/2013   Procedure: FLEXIBLE SIGMOIDOSCOPY;  Surgeon: Inda Castle, MD;  Location: [facility];  Service: Endoscopy;  Laterality: N/A;  may need hemorrhoidal banding   HEMORRHOIDECTOMY WITH HEMORRHOID BANDING  08-07-2010   IR RADIOLOGY PERIPHERAL GUIDED IV START  06/27/2019   IR US GUIDE VASC ACCESS RIGHT  06/27/2019   LAPAROSCOPIC CHOLECYSTECTOMY  1990   RIGHT/LEFT HEART CATH AND CORONARY ANGIOGRAPHY N/A 09/29/2017   Procedure: RIGHT/LEFT HEART CATH AND CORONARY ANGIOGRAPHY;  Surgeon: Sherren Mocha, MD;  Location: [facility];  Service: Cardiovascular;  Laterality: N/A;   SHOULDER ARTHROSCOPY WITH OPEN ROTATOR CUFF REPAIR AND DISTAL CLAVICLE ACROMINECTOMY Right 05/25/2012   Procedure: SHOULDER ARTHROSCOPY WITH OPEN ROTATOR CUFF REPAIR AND DISTAL CLAVICLE ACROMINECTOMY;  Surgeon: Magnus Sinning, MD;  Location: [facility];  Service: Orthopedics;  Laterality: Right;  RIGHT SHOULDER ARTHROSCOPY WITH DERBRIDEMENTOF LABRAL/BICEP TENDON, OPEN DISTAL CLAVICLE RESECTION, ANTERIOR ACROMINECTOMY ROTATOR CUFF REPAIR ANESTHESIA: GENERAL/SCALENE NERVE BLOCK   TEE WITHOUT CARDIOVERSION Bilateral 10/19/2017   Procedure: TRANSESOPHAGEAL ECHOCARDIOGRAM (TEE);  Surgeon: Sherren Mocha, MD;  Location: [facility];  Service: Open Heart Surgery;  Laterality: Bilateral;   TENOSYNOVECTOMY Left 01/04/2014   Procedure: LEFT WRIST EXTENSOR TENOSYNOVECTOMY;  Surgeon: Linna Hoff, MD;  Location: [facility];  Service: Orthopedics;  Laterality: Left;   THYROIDECTOMY  AGE 76   GOITER   TONSILLECTOMY AND ADENOIDECTOMY  AGE 82   TRANSCATHETER AORTIC VALVE REPLACEMENT, TRANSFEMORAL  10/19/2017   TRANSCATHETER AORTIC VALVE REPLACEMENT, TRANSFEMORAL Bilateral 10/19/2017   Procedure: TRANSCATHETER AORTIC VALVE REPLACEMENT, TRANSFEMORAL;  Surgeon: Sherren Mocha, MD;  Location: [facility];  Service: Open Heart Surgery;  Laterality: Bilateral;   TRANSTHORACIC ECHOCARDIOGRAM  last one 04-20-2013   DR [facility]    NORMAL LVSF/ EF 60-65%/ MODERATE  AV  STENOSIS WITH NO AR /  MILD LAE   UMBILICAL HERNIA REPAIR  JUNE 2006   VAGINAL HYSTERECTOMY  01-31-2001   ANTERIOR & POSTERIOR REPAIR/ TRANSVAGINAL BLADDER SLING   Social History   Occupational History   Occupation: Retired  Tobacco Use   Smoking status: Former    Packs/day: 0.25    Years: 5.00    Pack years: 1.25    Types: Cigarettes    Quit date: 04/14/2003    Years since quitting: 18.1   Smokeless tobacco: Never  Vaping Use   Vaping Use: Never used  Substance and Sexual Activity   Alcohol use: Yes  Comment: once in a while-social   Drug use: Never    Comment: hemp oil    Sexual activity: Not Currently

## 2021-06-03 NOTE — Telephone Encounter (Signed)
Noted  

## 2021-06-06 ENCOUNTER — Other Ambulatory Visit: Payer: Self-pay

## 2021-06-06 ENCOUNTER — Ambulatory Visit (HOSPITAL_COMMUNITY)
Admission: RE | Admit: 2021-06-06 | Discharge: 2021-06-06 | Disposition: A | Discharge status: Home / Self Care | Payer: Medicare Other | Source: Ambulatory Visit | Attending: Gastroenterology | Admitting: Gastroenterology

## 2021-06-06 DIAGNOSIS — K224 Dyskinesia of esophagus: Secondary | ICD-10-CM | POA: Diagnosis not present

## 2021-06-06 DIAGNOSIS — R1310 Dysphagia, unspecified: Secondary | ICD-10-CM | POA: Insufficient documentation

## 2021-06-06 DIAGNOSIS — R748 Abnormal levels of other serum enzymes: Secondary | ICD-10-CM | POA: Insufficient documentation

## 2021-06-18 ENCOUNTER — Telehealth: Payer: Self-pay

## 2021-06-18 NOTE — Telephone Encounter (Signed)
VOB submitted for  Durolane, right knee. °BV pending. °

## 2021-06-19 ENCOUNTER — Telehealth: Payer: Self-pay

## 2021-06-19 NOTE — Telephone Encounter (Signed)
Approved for Durolane, right knee. ?[place] ?Patient will be responsible for 20% OOP. ?Co-pay of $20.00 ?No PA required ? ?Appt. 07/02/2021 with Dr. Marlou Sa ?

## 2021-07-02 ENCOUNTER — Encounter: Payer: Self-pay | Admitting: Orthopedic Surgery

## 2021-07-02 ENCOUNTER — Ambulatory Visit (INDEPENDENT_AMBULATORY_CARE_PROVIDER_SITE_OTHER): Payer: Medicare Other | Admitting: Surgical

## 2021-07-02 ENCOUNTER — Other Ambulatory Visit: Payer: Self-pay

## 2021-07-02 DIAGNOSIS — M1711 Unilateral primary osteoarthritis, right knee: Secondary | ICD-10-CM

## 2021-07-02 MED ORDER — LIDOCAINE HCL 1 % IJ SOLN
5.0000 mL | INTRAMUSCULAR | Status: AC | PRN
  Administered 2021-07-02: 5 mL

## 2021-07-02 MED ORDER — SODIUM HYALURONATE 60 MG/3ML IX PRSY
60.0000 mg | PREFILLED_SYRINGE | INTRA_ARTICULAR | Status: AC | PRN
  Administered 2021-07-02: 60 mg via INTRA_ARTICULAR

## 2021-07-02 NOTE — Progress Notes (Signed)
? ?  Procedure Note ? ?Patient: [name]             ?Date of Birth: 02/09/1936           ?MRN: [id number]             ?Visit Date: 07/02/2021 ? ?Procedures: ?Visit Diagnoses:  ?1. Arthritis of right knee   ? ? ?Large Joint Inj: R knee on 07/02/2021 9:19 PM ?Indications: diagnostic evaluation, joint swelling and pain ?Details: 18 G 1.5 in needle, superolateral approach ? ?Arthrogram: No ? ?Medications: 5 mL lidocaine 1 %; 60 mg Sodium Hyaluronate 60 MG/3ML ?Outcome: tolerated well, no immediate complications ?Procedure, treatment alternatives, risks and benefits explained, specific risks discussed. Consent was given by the patient. Immediately prior to procedure a time out was called to verify the correct patient, procedure, equipment, support staff and site/side marked as required. Patient was prepped and draped in the usual sterile fashion.  ? ? ? ? ? ?

## 2021-07-11 ENCOUNTER — Encounter: Payer: Self-pay | Admitting: Orthopedic Surgery

## 2021-07-11 ENCOUNTER — Ambulatory Visit: Payer: Medicare Other | Admitting: Orthopedic Surgery

## 2021-07-11 ENCOUNTER — Ambulatory Visit (INDEPENDENT_AMBULATORY_CARE_PROVIDER_SITE_OTHER): Payer: Medicare Other

## 2021-07-11 DIAGNOSIS — M542 Cervicalgia: Secondary | ICD-10-CM

## 2021-07-11 NOTE — Progress Notes (Signed)
? ?Office Visit Note ?  ?Patient: [name]           ?Date of Birth: [birth year]           ?MRN: [id number] ?Visit Date: 07/11/2021 ?Requested by: [name], DO ?[address] [place],  [place] [address] ?PCP: [name], DO ? ?Subjective: ?Chief Complaint  ?Patient presents with  ? Neck - Pain  ? ? ?HPI: Patient presents for evaluation of neck pain.  Patient describes pain which radiates down into her left greater than right shoulders with burning and needle type symptoms.  Does not radiate down the arms very much.  She had prior right knee gel injection several weeks ago which did not help her knee very much.  Describes sharp pains in the back of the knee.  Has tried heat Tylenol rest.  Symptoms of the neck and been going on for 2 years.  Helps her to lay down.  Her C5-6 fusion occurred 15 years ago. ?             ?ROS: All systems reviewed are negative as they relate to the chief complaint within the history of present illness.  Patient denies  fevers or chills. ? ? ?Assessment & Plan: ?Visit Diagnoses:  ?1. Neck pain   ?2. Cervicalgia   ? ? ?Plan: Impression is possible adjacent segment disease at C6-7.  Shoulder examination normal.  Patient also has significant right knee pain with no real superb response from cortisone in February and gel injection recently.  She would like to try another cortisone injection given by me.  We will reevaluate her knee when she comes back for follow-up of C-spine MRI.  C-spine MRI indicated for long duration of neck symptoms and high likelihood of adjacent segment disease. ? ?Follow-Up Instructions: Return in about 2 weeks (around 07/25/2021).  ? ?Orders:  ?Orders Placed This Encounter  ?Procedures  ? XR Cervical Spine 2 or 3 views  ? MR Cervical Spine w/o contrast  ? ?No orders of the defined types were placed in this encounter. ? ? ? ? Procedures: ?No procedures performed ? ? ?Clinical Data: ?No additional findings. ? ?Objective: ?Vital Signs: There were no vitals  taken for this visit. ? ?Physical Exam:  ? ?Constitutional: Patient appears well-developed ?HEENT:  ?Head: Normocephalic ?Eyes:EOM are normal ?Neck: Normal range of motion ?Cardiovascular: Normal rate ?Pulmonary/chest: Effort normal ?Neurologic: Patient is alert ?Skin: Skin is warm ?Psychiatric: Patient has normal mood and affect ? ? ?Ortho Exam: Ortho exam demonstrates full active and passive range of motion of the cervical spine.  5 out of 5 grip EPL FPL interosseous wrist flexion extension bicep triceps and deltoid strength.  Radial pulse intact bilaterally.  Has good rotator cuff strength as well and no muscle atrophy in the arms.  No definite paresthesias C5-T1.  Right knee has good range of motion and no effusion with mild patellofemoral crepitus present and.  Retinacular tenderness noted to diffuse palpation. ? ?Specialty Comments:  ?No specialty comments available. ? ?Imaging: ?XR Cervical Spine 2 or 3 views ? ?Result Date: 07/11/2021 ?AP lateral radiographs cervical spine reviewed.  Cervical plate fixation at S3-4 in good position alignment with no hardware complications.  Significant adjacent segment disease present at C6-7 with minimal degenerative changes at C4-5.  Normal lordosis is present.  ? ? ?PMFS History: ?Patient Active Problem List  ? Diagnosis Date Noted  ? Dysphagia 05/26/2021  ? Elevated lipase 05/26/2021  ? Hospital discharge follow-up 05/09/2021  ?  Bilateral low back pain with sciatica 09/19/2019  ? Bilateral lower extremity edema 08/14/2019  ? S/P TAVR (transcatheter aortic valve replacement) 10/19/2017  ? Chronic fatigue 08/25/2017  ? DDD (degenerative disc disease), cervical 07/08/2017  ? Bursitis of left shoulder 07/08/2017  ? Fibromyalgia 07/08/2017  ? Atherosclerosis of abdominal aorta ([place]) 06/29/2017  ? Mixed hyperlipidemia 03/19/2017  ? Osteoporosis 04/21/2016  ? Internal hemorrhoids with other complication 27/25/3664  ? GI bleed 05/30/2013  ? NSAID long-term use 05/30/2013  ?  Severe aortic stenosis 05/29/2013  ? DIVERTICULOSIS-COLON 01/13/2010  ? CONSTIPATION 01/13/2010  ? PERSONAL HISTORY OF COLONIC POLYPS 01/13/2010  ? Essential hypertension 08/28/2008  ? GERD 08/28/2008  ? PALPITATIONS 08/28/2008  ? ?Past Medical History:  ?Diagnosis Date  ? Chronic headaches   ? Fibromyalgia   ? GERD (gastroesophageal reflux disease)   ? Hemorrhoids   ? INTERNAL--  POST BANDING 02/ 2015  ? History of kidney stones   ? Hyperlipidemia   ? Hypertension   ? Moderate aortic stenosis   ? OSA (obstructive sleep apnea) CPAP INTOLERANT   ? Osteoporosis   ? S/P TAVR (transcatheter aortic valve replacement) 10/19/2017  ? 20 mm Edwards Sapien 3 transcatheter heart valve placed via percutaneous right transfemoral approach   ? Severe aortic stenosis   ? Spinal stenosis, multilevel   ?  ?Family History  ?Problem Relation Age of Onset  ? Cirrhosis Mother   ?     drinking  ? Other Brother   ?     duodenal ulcer  ? Heart disease Sister   ? Cancer Son 90  ?     colon  ? Gallbladder disease Maternal Grandmother   ? Stomach cancer Paternal Grandfather   ?  ?Past Surgical History:  ?Procedure Laterality Date  ? ANTERIOR CERVICAL DECOMP/DISCECTOMY FUSION  11-11-1999  ? C5 - C6  ? APPENDECTOMY  AGE 48  ? CARDIAC CATHETERIZATION  2008  [name]  ? ESSENTIALLY NORMAL  ? CATARACT EXTRACTION Bilateral   ? CATARACT EXTRACTION W/ INTRAOCULAR LENS  IMPLANT, BILATERAL  2012  ? EYE SURGERY    ? FLEXIBLE SIGMOIDOSCOPY N/A 06/01/2013  ? Procedure: FLEXIBLE SIGMOIDOSCOPY;  Surgeon: Inda Castle, MD;  Location: [facility];  Service: Endoscopy;  Laterality: N/A;  may need hemorrhoidal banding  ? HEMORRHOIDECTOMY WITH HEMORRHOID BANDING  08-07-2010  ? IR RADIOLOGY PERIPHERAL GUIDED IV START  06/27/2019  ? IR US GUIDE VASC ACCESS RIGHT  06/27/2019  ? LAPAROSCOPIC CHOLECYSTECTOMY  1990  ? RIGHT/LEFT HEART CATH AND CORONARY ANGIOGRAPHY N/A 09/29/2017  ? Procedure: RIGHT/LEFT HEART CATH AND CORONARY ANGIOGRAPHY;  Surgeon: Sherren Mocha,  MD;  Location: [facility];  Service: Cardiovascular;  Laterality: N/A;  ? SHOULDER ARTHROSCOPY WITH OPEN ROTATOR CUFF REPAIR AND DISTAL CLAVICLE ACROMINECTOMY Right 05/25/2012  ? Procedure: SHOULDER ARTHROSCOPY WITH OPEN ROTATOR CUFF REPAIR AND DISTAL CLAVICLE ACROMINECTOMY;  Surgeon: Magnus Sinning, MD;  Location: [facility];  Service: Orthopedics;  Laterality: Right;  RIGHT SHOULDER ARTHROSCOPY WITH DERBRIDEMENTOF LABRAL/BICEP TENDON, OPEN DISTAL CLAVICLE RESECTION, ANTERIOR ACROMINECTOMY ROTATOR CUFF REPAIR ?ANESTHESIA: GENERAL/SCALENE NERVE BLOCK  ? TEE WITHOUT CARDIOVERSION Bilateral 10/19/2017  ? Procedure: TRANSESOPHAGEAL ECHOCARDIOGRAM (TEE);  Surgeon: Sherren Mocha, MD;  Location: [facility];  Service: Open Heart Surgery;  Laterality: Bilateral;  ? TENOSYNOVECTOMY Left 01/04/2014  ? Procedure: LEFT WRIST EXTENSOR TENOSYNOVECTOMY;  Surgeon: Linna Hoff, MD;  Location: [facility];  Service: Orthopedics;  Laterality: Left;  ? THYROIDECTOMY  AGE 30  ?  GOITER  ? TONSILLECTOMY AND ADENOIDECTOMY  AGE 89  ? TRANSCATHETER AORTIC VALVE REPLACEMENT, TRANSFEMORAL  10/19/2017  ? TRANSCATHETER AORTIC VALVE REPLACEMENT, TRANSFEMORAL Bilateral 10/19/2017  ? Procedure: TRANSCATHETER AORTIC VALVE REPLACEMENT, TRANSFEMORAL;  Surgeon: Sherren Mocha, MD;  Location: [facility];  Service: Open Heart Surgery;  Laterality: Bilateral;  ? TRANSTHORACIC ECHOCARDIOGRAM  last one 04-20-2013  DR [facility]  ?  NORMAL LVSF/ EF 60-65%/ MODERATE  AV  STENOSIS WITH NO AR /  MILD LAE  ? UMBILICAL HERNIA REPAIR  JUNE 2006  ? VAGINAL HYSTERECTOMY  01-31-2001  ? ANTERIOR & POSTERIOR REPAIR/ TRANSVAGINAL BLADDER SLING  ? ?Social History  ? ?Occupational History  ? Occupation: Retired  ?Tobacco Use  ? Smoking status: Former  ?  Packs/day: 0.25  ?  Years: 5.00  ?  Pack years: 1.25  ?  Types: Cigarettes  ?  Quit date: 04/14/2003  ?  Years since quitting: 18.2  ? Smokeless tobacco: Never  ?Vaping Use  ? Vaping Use: Never  used  ?Substance and Sexual Activity  ? Alcohol use: Yes  ?  Comment: once in a while-social  ? Drug use: Never  ?  Comment: hemp oil   ? Sexual activity: Not Currently  ? ? ? ? ? ?

## 2021-07-16 ENCOUNTER — Other Ambulatory Visit: Payer: Self-pay | Admitting: Family Medicine

## 2021-07-16 NOTE — Telephone Encounter (Signed)
[place]. NTBS 30 days given 05/19/21 ?

## 2021-07-16 NOTE — Telephone Encounter (Signed)
Called patient and made her aware that she will not receive any refills until she is seen. She did not want to make an appt at this time and said she would call back to schedule.  ?

## 2021-07-21 ENCOUNTER — Telehealth: Payer: Self-pay | Admitting: Emergency Medicine

## 2021-07-21 NOTE — Telephone Encounter (Signed)
Called pt 1X to set an MRI review appt with Dr. Marlou Sa after 4/18 ?

## 2021-07-29 ENCOUNTER — Encounter: Payer: Self-pay | Admitting: Family

## 2021-07-29 ENCOUNTER — Ambulatory Visit (INDEPENDENT_AMBULATORY_CARE_PROVIDER_SITE_OTHER): Payer: Medicare Other | Admitting: Family

## 2021-07-29 ENCOUNTER — Ambulatory Visit
Admission: RE | Admit: 2021-07-29 | Discharge: 2021-07-29 | Disposition: A | Discharge status: Home / Self Care | Payer: Medicare Other | Source: Ambulatory Visit | Attending: Orthopedic Surgery | Admitting: Orthopedic Surgery

## 2021-07-29 VITALS — BP 146/45 | HR 56 | Temp 97.9°F | Ht 62.0 in | Wt 161.0 lb

## 2021-07-29 DIAGNOSIS — W57XXXA Bitten or stung by nonvenomous insect and other nonvenomous arthropods, initial encounter: Secondary | ICD-10-CM | POA: Diagnosis not present

## 2021-07-29 DIAGNOSIS — S1086XA Insect bite of other specified part of neck, initial encounter: Secondary | ICD-10-CM | POA: Diagnosis not present

## 2021-07-29 DIAGNOSIS — M4802 Spinal stenosis, cervical region: Secondary | ICD-10-CM | POA: Diagnosis not present

## 2021-07-29 DIAGNOSIS — M50223 Other cervical disc displacement at C6-C7 level: Secondary | ICD-10-CM | POA: Diagnosis not present

## 2021-07-29 DIAGNOSIS — M542 Cervicalgia: Secondary | ICD-10-CM

## 2021-07-29 MED ORDER — DOXYCYCLINE HYCLATE 100 MG PO TABS: 100.0000 mg | ORAL_TABLET | Freq: Two times a day (BID) | ORAL | 0 refills | Status: DC

## 2021-07-29 NOTE — Patient Instructions (Signed)
Tick Bite Information, Adult Ticks are insects that draw blood for food. Most ticks live in shrubs and grassy and wooded areas. They climb onto people and animals that brush against the leaves and grasses that they rest on. Then they bite, attaching themselves to the skin. Most ticks are harmless, but some ticks may carry germs that can spread to a person through a bite and cause a disease. To reduce your risk of getting a disease from a tick bite, make sure you: Take steps to prevent tick bites. Check for ticks after being outdoors where ticks live. Watch for symptoms of disease if a tick attached to you or if you suspect a tick bite. How can I prevent tick bites? Take these steps to help prevent tick bites when you go outdoors in an area where ticks live: Use insect repellent Use insect repellent that has DEET (20% or higher), picaridin, or IR3535 in it. Follow the instructions on the label. Use these products on: Bare skin. The top of your boots. Your pant legs. Your sleeve cuffs. For insect repellent that contains permethrin, follow the instructions on the label. Use these products on: Clothing. Boots. Outdoor gear. Tents. When you are outside Wear protective clothing. Long sleeves and long pants offer the best protection from ticks. Wear light-colored clothing so you can see ticks more easily. Tuck your pant legs into your socks. If you go walking on a trail, stay in the middle of the trail so your skin, hair, and clothing do not touch the bushes. Avoid walking through areas with long grass. Check for ticks on your clothing, hair, and skin often while you are outside, and check again before you go inside. Make sure to check the scalp, neck, armpits, waist, groin, and joint areas. These are the spots where ticks attach themselves most often. When you go indoors Check your clothing for ticks. Tumble dry clothes in a dryer on high heat for at least 10 minutes. If clothes are damp,  additional time may be needed. If clothes require washing, use hot water. Examine gear and pets. Shower soon after being outdoors. Check your body for ticks. Conduct a full body check using a mirror. What is the proper way to remove a tick? If you find a tick on your body, remove it as soon as possible. Removing a tick sooner can prevent germs from passing to your body. Do not remove the tick with your bare fingers. To remove a tick that is crawling on your skin but has not bitten, use either of these methods: Go outdoors and brush the tick off. Remove the tick with tape or a lint roller. To remove a tick that is attached to your skin: Wash your hands. If you have latex gloves, put them on. Use fine-tipped tweezers, curved forceps, or a tick-removal tool to gently grasp the tick as close to your skin and the tick's head as possible. Gently pull with a steady, upward, even pressure until the tick lets go. When removing the tick: Take care to keep the tick's head attached to its body. Do not twist or jerk the tick. This can make the tick's head or mouth parts break off and remain in the skin. Do not squeeze or crush the tick's body. This could force disease-carrying fluids from the tick into your body. Do not try to remove a tick with heat, alcohol, petroleum jelly, or fingernail polish. Using these methods can cause the tick to salivate and regurgitate into your bloodstream,   increasing your risk of getting a disease. What should I do after removing a tick? Dispose of the tick. Do not crush a tick with your fingers. Clean the bite area and your hands with soap and water, rubbing alcohol, or an iodine scrub. If an antiseptic cream or ointment is available, apply a small amount to the bite site. Wash and disinfect any instruments that you used to remove the tick. How should I dispose of a tick? To dispose of a live tick, use one of these methods: Place it in rubbing alcohol. Place it in a sealed  bag or container. Wrap it tightly in tape. Flush it down the toilet. Contact a health care provider if: You have symptoms of a disease after a tick bite. Symptoms of a tick-borne disease can occur from moments after the tick bites to 30 days after a tick is removed. Symptoms include: Fever or chills. Any of these signs in the bite area: A red rash that makes a circle (bull's-eye rash) in the bite area. Redness and swelling. Headache. Muscle, joint, or bone pain. Abnormal tiredness. Numbness in your legs or difficulty walking or moving your legs. Tender, swollen lymph glands. A part of a tick breaks off and gets stuck in your skin. Get help right away if: You are not able to remove a tick. You experience muscle weakness or paralysis. Your symptoms get worse or you experience new symptoms. You find an engorged tick on your skin and you are in an area where disease from ticks is a high risk. Summary Ticks may carry germs that can spread to a person through a bite and cause a disease. Wear protective clothing and use insect repellent to prevent tick bites. Follow the instructions on the label. If you find a tick on your body, remove it as soon as possible. If the tick is attached, do not try to remove with heat, alcohol, petroleum jelly, or fingernail polish. Remove the attached tick using fine-tipped tweezers, curved forceps, or a tick-removal tool. Gently pull with steady, upward, even pressure until the tick lets go. Do not twist or jerk the tick. Do not squeeze or crush the tick's body. If you have symptoms of a disease after being bitten by a tick, contact a health care provider. This information is not intended to replace advice given to you by your health care provider. Make sure you discuss any questions you have with your health care provider. Document Revised: 03/27/2019 Document Reviewed: 03/27/2019 Elsevier Patient Education  2023 Elsevier Inc.  

## 2021-07-29 NOTE — Progress Notes (Signed)
? ?  Subjective:  ? ? Patient ID: [name], female    DOB: [birth year], 86 y.o.   MRN: [id number] ? ?Chief Complaint  ?Patient presents with  ? Tick Removal  ? ? ?HPI ?Pt presents to the office today with a tick bite on her left neck that she noticed yesterday. Unsure how long it was attached, but thinks less than a day.  ? ?Reports the area is red and very itching. Denies any joint pain, headaches, or fevers.  ? ? ?Review of Systems  ?All other systems reviewed and are negative. ? ?   ?Objective:  ? Physical Exam ?Vitals reviewed.  ?Constitutional:   ?   General: She is not in acute distress. ?   Appearance: She is well-developed.  ?HENT:  ?   Head: Normocephalic and atraumatic.  ?Eyes:  ?   Pupils: Pupils are equal, round, and reactive to light.  ?Neck:  ?   Thyroid: No thyromegaly.  ? ?   Comments: 3X2.5 cm hard, erythemas area on left neck ?Cardiovascular:  ?   Rate and Rhythm: Normal rate and regular rhythm.  ?   Heart sounds: Normal heart sounds. No murmur heard. ?Pulmonary:  ?   Effort: Pulmonary effort is normal. No respiratory distress.  ?   Breath sounds: Normal breath sounds. No wheezing.  ?Abdominal:  ?   General: Bowel sounds are normal. There is no distension.  ?   Palpations: Abdomen is soft.  ?   Tenderness: There is no abdominal tenderness.  ?Musculoskeletal:     ?   General: No tenderness. Normal range of motion.  ?   Cervical back: Normal range of motion and neck supple.  ?Skin: ?   General: Skin is warm and dry.  ?Neurological:  ?   Mental Status: She is alert and oriented to person, place, and time.  ?   Cranial Nerves: No cranial nerve deficit.  ?   Deep Tendon Reflexes: Reflexes are normal and symmetric.  ?Psychiatric:     ?   Behavior: Behavior normal.     ?   Thought Content: Thought content normal.     ?   Judgment: Judgment normal.  ? ? ? ? ? ?  BP (!) 146/45   Pulse (!) 56   Temp 97.9 ?F (36.6 ?C) (Oral)   Ht '5\' 2"'$  (1.575 m)   Wt 161 lb (73 kg)   SpO2 97%   BMI 29.45 kg/m?   ? ?Assessment & Plan:  ? ?[name] comes in today with chief complaint of Tick Removal ? ? ?Diagnosis and orders addressed: ? ?1. Tick bite, unspecified site, initial encounter ?-Pt to report any new fever, joint pain, or rash ?-Wear protective clothing while outside- Long sleeves and long pants ?-Put insect repellent on all exposed skin and along clothing ?-Take a shower as soon as possible after being outside ?-Follow up if symptoms worsen or do not improve  ?- doxycycline (VIBRA-TABS) 100 MG tablet; Take 1 tablet (100 mg total) by mouth 2 (two) times daily.  Dispense: 42 tablet; Refill: 0 ? ? ?Evelina Dun, FNP ? ?

## 2021-08-01 ENCOUNTER — Other Ambulatory Visit: Payer: Self-pay | Admitting: Family Medicine

## 2021-08-01 MED ORDER — ROSUVASTATIN CALCIUM 5 MG PO TABS: 5.0000 mg | ORAL_TABLET | Freq: Every day | ORAL | 0 refills | Status: DC

## 2021-08-01 NOTE — Telephone Encounter (Signed)
Apt 08/15/2021 ?

## 2021-08-01 NOTE — Addendum Note (Signed)
Addended by: Antonietta Barcelona D on: 08/01/2021 12:08 PM ? ? Modules accepted: Orders ? ?

## 2021-08-01 NOTE — Telephone Encounter (Signed)
[place]. NTBS 30 days given 06/24/21 ?

## 2021-08-13 ENCOUNTER — Encounter: Payer: Self-pay | Admitting: Orthopedic Surgery

## 2021-08-13 ENCOUNTER — Ambulatory Visit: Payer: Medicare Other | Admitting: Orthopedic Surgery

## 2021-08-13 DIAGNOSIS — M48061 Spinal stenosis, lumbar region without neurogenic claudication: Secondary | ICD-10-CM

## 2021-08-13 MED ORDER — TRAMADOL HCL 50 MG PO TABS: ORAL_TABLET | ORAL | 0 refills | Status: DC

## 2021-08-15 ENCOUNTER — Encounter: Payer: Self-pay | Admitting: Family Medicine

## 2021-08-15 ENCOUNTER — Ambulatory Visit (INDEPENDENT_AMBULATORY_CARE_PROVIDER_SITE_OTHER): Payer: Medicare Other | Admitting: Family Medicine

## 2021-08-15 VITALS — BP 158/50 | HR 69 | Temp 98.1°F | Ht 62.0 in | Wt 157.0 lb

## 2021-08-15 DIAGNOSIS — M130 Polyarthritis, unspecified: Secondary | ICD-10-CM | POA: Diagnosis not present

## 2021-08-15 DIAGNOSIS — Z7409 Other reduced mobility: Secondary | ICD-10-CM

## 2021-08-15 NOTE — Progress Notes (Signed)
? ?Subjective: ?ZY:SAYT ?PCP: [name], DO ?HPI:[name] is a 86 y.o. female presenting to clinic today for: ? ?1. Arthralgia ?Patient reports polyarthralgia.  She has had this issue chronically and has tried various over-the-counter homeopathic remedies without much success.  In fact she reports that she tried using CBD at 1 point and had quite a negative experience with that, experiencing paranoia and out of body experience.  She reports has not gone back to that.  She has spoken to her orthopedist who have tried injection therapy of her knees without any success.  She sounds like she needs a knee replacement but given her advanced cardiovascular disease there is hesitation to do so.  They are going to be evaluating her low back to see if perhaps this may be the etiology of her pain soon.  She was prescribed tramadol but has been extremely reluctant to use it after reading the side effects.  She wanted to discuss this with me before proceeding. ? ? ?ROS: Per HPI ? ?Allergies  ?Allergen Reactions  ? Tape Other (See Comments)  ?  Dermatitis rash "with extended exposure"  ? Prolia [Denosumab] Other (See Comments)  ?  Arthralgia/ myalgia/ jaw pain/ headache  ? ?Past Medical History:  ?Diagnosis Date  ? Chronic headaches   ? Fibromyalgia   ? GERD (gastroesophageal reflux disease)   ? Hemorrhoids   ? INTERNAL--  POST BANDING 02/ 2015  ? History of kidney stones   ? Hyperlipidemia   ? Hypertension   ? Moderate aortic stenosis   ? OSA (obstructive sleep apnea) CPAP INTOLERANT   ? Osteoporosis   ? S/P TAVR (transcatheter aortic valve replacement) 10/19/2017  ? 20 mm Edwards Sapien 3 transcatheter heart valve placed via percutaneous right transfemoral approach   ? Severe aortic stenosis   ? Spinal stenosis, multilevel   ? ? ?Current Outpatient Medications:  ?  acetaminophen (TYLENOL) 500 MG tablet, Take 1,000 mg by mouth daily as needed for moderate pain or headache., Disp: , Rfl:  ?  ALOE VERA PO, Take 1  capsule by mouth daily., Disp: , Rfl:  ?  Ascorbic Acid (VITAMIN C) 1000 MG tablet, Take 1,000 mg by mouth daily., Disp: , Rfl:  ?  aspirin EC 81 MG tablet, Take 1 tablet (81 mg total) by mouth daily., Disp: 90 tablet, Rfl: 3 ?  BEE POLLEN PO, Take 400 mg by mouth daily., Disp: , Rfl:  ?  clobetasol cream (TEMOVATE) 0.16 %, APPLY 1 APPLICATION TOPICALLY 2 (TWO) TIMES DAILY AS NEEDED., Disp: 30 g, Rfl: 3 ?  Coenzyme Q10 (COQ-10) 100 MG CAPS, Take 100 mg by mouth daily., Disp: , Rfl:  ?  doxycycline (VIBRA-TABS) 100 MG tablet, Take 1 tablet (100 mg total) by mouth 2 (two) times daily., Disp: 42 tablet, Rfl: 0 ?  furosemide (LASIX) 20 MG tablet, Take 1 tablet (20 mg total) by mouth daily., Disp: 90 tablet, Rfl: 3 ?  losartan (COZAAR) 100 MG tablet, Take 1 tablet (100 mg total) by mouth daily., Disp: 90 tablet, Rfl: 3 ?  metoprolol tartrate (LOPRESSOR) 50 MG tablet, TAKE 1 TABLET BY MOUTH TWICE A DAY, Disp: 180 tablet, Rfl: 2 ?  Misc Natural Products (TART CHERRY ADVANCED PO), Take by mouth daily., Disp: , Rfl:  ?  mupirocin ointment (BACTROBAN) 2 %, Apply to the affected area twice daily for 7-10 days, Disp: 22 g, Rfl: 0 ?  OVER THE COUNTER MEDICATION, Take 1 capsule by mouth 2 (two) times  daily. Hemp Oil, Disp: , Rfl:  ?  rosuvastatin (CRESTOR) 5 MG tablet, Take 1 tablet (5 mg total) by mouth daily., Disp: 30 tablet, Rfl: 0 ?  traMADol (ULTRAM) 50 MG tablet, 1 po q 12 prn pain, Disp: 30 tablet, Rfl: 0 ?  UNABLE TO FIND, Med Name: HEMP- Tumeric cream / RUB, Disp: , Rfl:  ?Social History  ? ?Socioeconomic History  ? Marital status: Married  ?  Spouse name: Shanon Brow  ? Number of children: 6  ? Years of education: 6  ? Highest education level: 6th grade  ?Occupational History  ? Occupation: Retired  ?Tobacco Use  ? Smoking status: Former  ?  Packs/day: 0.25  ?  Years: 5.00  ?  Pack years: 1.25  ?  Types: Cigarettes  ?  Quit date: 04/14/2003  ?  Years since quitting: 18.3  ? Smokeless tobacco: Never  ?Vaping Use  ? Vaping Use:  Never used  ?Substance and Sexual Activity  ? Alcohol use: Yes  ?  Comment: once in a while-social  ? Drug use: Never  ?  Comment: hemp oil   ? Sexual activity: Not Currently  ?Other Topics Concern  ? Not on file  ?Social History Narrative  ? Not on file  ? ?Social Determinants of Health  ? ?Financial Resource Strain: Not on file  ?Food Insecurity: Not on file  ?Transportation Needs: Not on file  ?Physical Activity: Not on file  ?Stress: Not on file  ?Social Connections: Not on file  ?Intimate Partner Violence: Not on file  ? ?Family History  ?Problem Relation Age of Onset  ? Cirrhosis Mother   ?     drinking  ? Other Brother   ?     duodenal ulcer  ? Heart disease Sister   ? Cancer Son 11  ?     colon  ? Gallbladder disease Maternal Grandmother   ? Stomach cancer Paternal Grandfather   ? ? ?Objective: ?Office vital signs reviewed. ?BP (!) 158/50   Pulse 69   Temp 98.1 ?F (36.7 ?C)   Ht '5\' 2"'$  (1.575 m)   Wt 157 lb (71.2 kg)   SpO2 96%   BMI 28.72 kg/m?  ? ?Physical Examination:  ?General: Awake, alert, nontoxic-appearing female, No acute distress ?MSK: Gait very antalgic.  She utilizes the wall for stability when ambulating.  She has osteoarthritic changes throughout the joints ? ?Assessment/ Plan: ?86 y.o. female  ? ?Polyarthritis ? ?Impaired mobility ? ?I think that proceeding with the tramadol is fine for this patient.  In fact I would be glad to take over prescribing this if needed going forward and encouraged her to follow-up with me if she needs refills.  I do worry about her gait today as this is the most antalgic I have seen her.  Discussed turmeric as a natural anti-inflammatory.  We discussed naturally anti-inflammatory foods like garlic and onions as well.  Continue to follow-up with orthopedics and will await their recommendations. ? ? ?No orders of the defined types were placed in this encounter. ? ?No orders of the defined types were placed in this encounter. ? ? ? ?[name],  DO ?[facility] ?[phone] ? ? ?

## 2021-08-16 ENCOUNTER — Other Ambulatory Visit: Payer: Self-pay | Admitting: Family

## 2021-08-16 DIAGNOSIS — W57XXXA Bitten or stung by nonvenomous insect and other nonvenomous arthropods, initial encounter: Secondary | ICD-10-CM

## 2021-08-18 ENCOUNTER — Encounter: Payer: Self-pay | Admitting: Orthopedic Surgery

## 2021-08-18 NOTE — Progress Notes (Signed)
? ?Office Visit Note ?  ?Patient: [name]           ?Date of Birth: 1935-10-12           ?MRN: [id number] ?Visit Date: 08/13/2021 ?Requested by: [name], DO ?[address] [place],  [place] [address] ?PCP: [name], DO ? ?Subjective: ?Chief Complaint  ?Patient presents with  ? Lower Back - Pain  ? ? ?HPI: Patient presents for evaluation of cervical spine pain and now low back pain.  Since she was last seen she has had an MRI scan of her neck.  States her neck is now better but she reports legs and lower back pain.  Also left knee pain.  Over-the-counter medication helps some.  MRI scan of her neck shows mild degenerative disc disease of bulge at C4-5 as well as C6-7.  Does not look operative.  She does describe stenosis type symptoms with walking affecting both legs.  Pain is quite severe by her report.  Denies a history of injury to her back. ?             ?ROS: All systems reviewed are negative as they relate to the chief complaint within the history of present illness.  Patient denies  fevers or chills. ? ? ?Assessment & Plan: ?Visit Diagnoses:  ?1. Spinal stenosis of lumbar region, unspecified whether neurogenic claudication present   ? ? ?Plan: Impression is neck which is improved with some degenerative changes but not too bad for an 86 year old.  She is having some more severe type stenosis symptoms in her back.  Prior radiographs do show degenerative changes.  MRI scan lumbar spine indicated with possible ESI to follow.  Follow-up after that study. ? ?Follow-Up Instructions: Return for after MRI.  ? ?Orders:  ?Orders Placed This Encounter  ?Procedures  ? MR Lumbar Spine w/o contrast  ? ?Meds ordered this encounter  ?Medications  ? traMADol (ULTRAM) 50 MG tablet  ?  Sig: 1 po q 12 prn pain  ?  Dispense:  30 tablet  ?  Refill:  0  ? ? ? ? Procedures: ?No procedures performed ? ? ?Clinical Data: ?No additional findings. ? ?Objective: ?Vital Signs: There were no vitals taken for this  visit. ? ?Physical Exam:  ? ?Constitutional: Patient appears well-developed ?HEENT:  ?Head: Normocephalic ?Eyes:EOM are normal ?Neck: Normal range of motion ?Cardiovascular: Normal rate ?Pulmonary/chest: Effort normal ?Neurologic: Patient is alert ?Skin: Skin is warm ?Psychiatric: Patient has normal mood and affect ? ? ?Ortho Exam: Ortho exam demonstrates pretty reasonable cervical spine range of motion.  5 out of 5 grip EPL FPL interosseous wrist flexion extension bicep triceps and deltoid strength.  No definite paresthesias C5-T1 with symmetric reflexes 0 to 1+ out of 4 bilateral biceps and triceps.  No groin pain with internal/external rotation of the leg.  Pedal pulses palpable.  No other masses lymphadenopathy or skin changes noted in the back or leg region with no trochanteric tenderness is present.  Reflexes symmetric also bilateral Achilles and patella 0 to 1+ out of 4.  Has good ankle dorsiflexion plantarflexion quad and hamstring strength.  Mild pain with forward and lateral bending. ? ?Specialty Comments:  ?No specialty comments available. ? ?Imaging: ?No results found. ? ? ?PMFS History: ?Patient Active Problem List  ? Diagnosis Date Noted  ? Dysphagia 05/26/2021  ? Elevated lipase 05/26/2021  ? Hospital discharge follow-up 05/09/2021  ? Bilateral low back pain with sciatica 09/19/2019  ? Bilateral  lower extremity edema 08/14/2019  ? S/P TAVR (transcatheter aortic valve replacement) 10/19/2017  ? Chronic fatigue 08/25/2017  ? DDD (degenerative disc disease), cervical 07/08/2017  ? Bursitis of left shoulder 07/08/2017  ? Fibromyalgia 07/08/2017  ? Atherosclerosis of abdominal aorta ([place]) 06/29/2017  ? Mixed hyperlipidemia 03/19/2017  ? Osteoporosis 04/21/2016  ? Internal hemorrhoids with other complication 97/58/8325  ? GI bleed 05/30/2013  ? NSAID long-term use 05/30/2013  ? Severe aortic stenosis 05/29/2013  ? DIVERTICULOSIS-COLON 01/13/2010  ? CONSTIPATION 01/13/2010  ? PERSONAL HISTORY OF COLONIC  POLYPS 01/13/2010  ? Essential hypertension 08/28/2008  ? GERD 08/28/2008  ? PALPITATIONS 08/28/2008  ? ?Past Medical History:  ?Diagnosis Date  ? Chronic headaches   ? Fibromyalgia   ? GERD (gastroesophageal reflux disease)   ? Hemorrhoids   ? INTERNAL--  POST BANDING 02/ 2015  ? History of kidney stones   ? Hyperlipidemia   ? Hypertension   ? Moderate aortic stenosis   ? OSA (obstructive sleep apnea) CPAP INTOLERANT   ? Osteoporosis   ? S/P TAVR (transcatheter aortic valve replacement) 10/19/2017  ? 20 mm Edwards Sapien 3 transcatheter heart valve placed via percutaneous right transfemoral approach   ? Severe aortic stenosis   ? Spinal stenosis, multilevel   ?  ?Family History  ?Problem Relation Age of Onset  ? Cirrhosis Mother   ?     drinking  ? Other Brother   ?     duodenal ulcer  ? Heart disease Sister   ? Cancer Son 42  ?     colon  ? Gallbladder disease Maternal Grandmother   ? Stomach cancer Paternal Grandfather   ?  ?Past Surgical History:  ?Procedure Laterality Date  ? ANTERIOR CERVICAL DECOMP/DISCECTOMY FUSION  11-11-1999  ? C5 - C6  ? APPENDECTOMY  AGE 74  ? CARDIAC CATHETERIZATION  2008  [name]  ? ESSENTIALLY NORMAL  ? CATARACT EXTRACTION Bilateral   ? CATARACT EXTRACTION W/ INTRAOCULAR LENS  IMPLANT, BILATERAL  2012  ? EYE SURGERY    ? FLEXIBLE SIGMOIDOSCOPY N/A 06/01/2013  ? Procedure: FLEXIBLE SIGMOIDOSCOPY;  Surgeon: Inda Castle, MD;  Location: [facility];  Service: Endoscopy;  Laterality: N/A;  may need hemorrhoidal banding  ? HEMORRHOIDECTOMY WITH HEMORRHOID BANDING  08-07-2010  ? IR RADIOLOGY PERIPHERAL GUIDED IV START  06/27/2019  ? IR US GUIDE VASC ACCESS RIGHT  06/27/2019  ? LAPAROSCOPIC CHOLECYSTECTOMY  1990  ? RIGHT/LEFT HEART CATH AND CORONARY ANGIOGRAPHY N/A 09/29/2017  ? Procedure: RIGHT/LEFT HEART CATH AND CORONARY ANGIOGRAPHY;  Surgeon: Sherren Mocha, MD;  Location: [facility];  Service: Cardiovascular;  Laterality: N/A;  ? SHOULDER ARTHROSCOPY WITH OPEN ROTATOR CUFF  REPAIR AND DISTAL CLAVICLE ACROMINECTOMY Right 05/25/2012  ? Procedure: SHOULDER ARTHROSCOPY WITH OPEN ROTATOR CUFF REPAIR AND DISTAL CLAVICLE ACROMINECTOMY;  Surgeon: Magnus Sinning, MD;  Location: [facility];  Service: Orthopedics;  Laterality: Right;  RIGHT SHOULDER ARTHROSCOPY WITH DERBRIDEMENTOF LABRAL/BICEP TENDON, OPEN DISTAL CLAVICLE RESECTION, ANTERIOR ACROMINECTOMY ROTATOR CUFF REPAIR ?ANESTHESIA: GENERAL/SCALENE NERVE BLOCK  ? TEE WITHOUT CARDIOVERSION Bilateral 10/19/2017  ? Procedure: TRANSESOPHAGEAL ECHOCARDIOGRAM (TEE);  Surgeon: Sherren Mocha, MD;  Location: [facility];  Service: Open Heart Surgery;  Laterality: Bilateral;  ? TENOSYNOVECTOMY Left 01/04/2014  ? Procedure: LEFT WRIST EXTENSOR TENOSYNOVECTOMY;  Surgeon: Linna Hoff, MD;  Location: [facility];  Service: Orthopedics;  Laterality: Left;  ? THYROIDECTOMY  AGE 29  ? GOITER  ? TONSILLECTOMY AND ADENOIDECTOMY  AGE 32  ?  TRANSCATHETER AORTIC VALVE REPLACEMENT, TRANSFEMORAL  10/19/2017  ? TRANSCATHETER AORTIC VALVE REPLACEMENT, TRANSFEMORAL Bilateral 10/19/2017  ? Procedure: TRANSCATHETER AORTIC VALVE REPLACEMENT, TRANSFEMORAL;  Surgeon: Sherren Mocha, MD;  Location: [facility];  Service: Open Heart Surgery;  Laterality: Bilateral;  ? TRANSTHORACIC ECHOCARDIOGRAM  last one 04-20-2013  DR NISHAN  ?  NORMAL LVSF/ EF 60-65%/ MODERATE  AV  STENOSIS WITH NO AR /  MILD LAE  ? UMBILICAL HERNIA REPAIR  JUNE 2006  ? VAGINAL HYSTERECTOMY  01-31-2001  ? ANTERIOR & POSTERIOR REPAIR/ TRANSVAGINAL BLADDER SLING  ? ?Social History  ? ?Occupational History  ? Occupation: Retired  ?Tobacco Use  ? Smoking status: Former  ?  Packs/day: 0.25  ?  Years: 5.00  ?  Pack years: 1.25  ?  Types: Cigarettes  ?  Quit date: 04/14/2003  ?  Years since quitting: 18.3  ? Smokeless tobacco: Never  ?Vaping Use  ? Vaping Use: Never used  ?Substance and Sexual Activity  ? Alcohol use: Yes  ?  Comment: once in a while-social  ? Drug use: Never  ?  Comment:  hemp oil   ? Sexual activity: Not Currently  ? ? ? ? ? ?

## 2021-08-26 ENCOUNTER — Other Ambulatory Visit: Payer: Self-pay | Admitting: Family Medicine

## 2021-08-29 ENCOUNTER — Ambulatory Visit
Admission: RE | Admit: 2021-08-29 | Discharge: 2021-08-29 | Disposition: A | Discharge status: Home / Self Care | Payer: Medicare Other | Source: Ambulatory Visit | Attending: Orthopedic Surgery | Admitting: Orthopedic Surgery

## 2021-08-29 DIAGNOSIS — M47816 Spondylosis without myelopathy or radiculopathy, lumbar region: Secondary | ICD-10-CM | POA: Diagnosis not present

## 2021-08-29 DIAGNOSIS — M545 Low back pain, unspecified: Secondary | ICD-10-CM | POA: Diagnosis not present

## 2021-08-29 DIAGNOSIS — M48061 Spinal stenosis, lumbar region without neurogenic claudication: Secondary | ICD-10-CM

## 2021-08-29 DIAGNOSIS — M4316 Spondylolisthesis, lumbar region: Secondary | ICD-10-CM | POA: Diagnosis not present

## 2021-09-11 ENCOUNTER — Ambulatory Visit: Payer: Medicare Other | Admitting: Orthopedic Surgery

## 2021-09-11 DIAGNOSIS — M170 Bilateral primary osteoarthritis of knee: Secondary | ICD-10-CM

## 2021-09-11 DIAGNOSIS — M1711 Unilateral primary osteoarthritis, right knee: Secondary | ICD-10-CM | POA: Diagnosis not present

## 2021-09-11 DIAGNOSIS — M48061 Spinal stenosis, lumbar region without neurogenic claudication: Secondary | ICD-10-CM

## 2021-09-11 DIAGNOSIS — M1712 Unilateral primary osteoarthritis, left knee: Secondary | ICD-10-CM

## 2021-09-11 MED ORDER — TRAMADOL HCL 50 MG PO TABS
50.0000 mg | ORAL_TABLET | Freq: Three times a day (TID) | ORAL | 0 refills | Status: DC | PRN
Start: 2021-09-11 — End: 2022-05-05

## 2021-09-14 ENCOUNTER — Encounter: Payer: Self-pay | Admitting: Orthopedic Surgery

## 2021-09-14 MED ORDER — LIDOCAINE HCL 1 % IJ SOLN
5.0000 mL | INTRAMUSCULAR | Status: AC | PRN
  Administered 2021-09-11: 5 mL

## 2021-09-14 MED ORDER — METHYLPREDNISOLONE ACETATE 40 MG/ML IJ SUSP
40.0000 mg | INTRAMUSCULAR | Status: AC | PRN
  Administered 2021-09-11: 40 mg via INTRA_ARTICULAR

## 2021-09-14 MED ORDER — BUPIVACAINE HCL 0.25 % IJ SOLN
4.0000 mL | INTRAMUSCULAR | Status: AC | PRN
  Administered 2021-09-11: 4 mL via INTRA_ARTICULAR

## 2021-09-14 NOTE — Progress Notes (Signed)
Office Visit Note   Patient: [name]           Date of Birth: Sep 09, 1935           MRN: [id number] Visit Date: 09/11/2021 Requested by: Megan Norlander, DO [place],  [place] [address] PCP: Megan Norlander, DO  Subjective: Chief Complaint  Patient presents with   Other     Scan review    HPI: [name] is a 86 year old patient here for MRI review of her lumbar spine.  This study shows diffuse lumbar spondylosis and no acute osseous injury.  She also reports bilateral knee pain.  Hard for her to walk.  Medications have not been helpful.  She also has not helpful.  Would like to try cortisone.  Also takes tramadol but not every day and she states that helps.  I did talk with her about the addictive nature of some of these medications including tramadol.              ROS: All systems reviewed are negative as they relate to the chief complaint within the history of present illness.  Patient denies  fevers or chills.   Assessment & Plan: Visit Diagnoses:  1. Arthritis of right knee   2. Primary osteoarthritis of both knees     Plan: Impression is bilateral knee arthritis.  Plan is bilateral knee injections today.  Nonweightbearing quad training exercises encouraged.  Refill tramadol x1 only.  Follow-up with [place] as needed.  She does have a lot of orthopedic complaints which do seem to improve once we get them imaged.  I think the injections will help her knee.  Follow-up with [place] as needed.  Follow-Up Instructions: No follow-ups on file.   Orders:  No orders of the defined types were placed in this encounter.  Meds ordered this encounter  Medications   traMADol (ULTRAM) 50 MG tablet    Sig: Take 1 tablet (50 mg total) by mouth every 8 (eight) hours as needed.    Dispense:  30 tablet    Refill:  0      Procedures: Large Joint Inj: R knee on 09/11/2021 10:45 PM Indications: diagnostic evaluation, joint swelling and pain Details: 18 G 1.5 in needle, superolateral  approach  Arthrogram: No  Medications: 5 mL lidocaine 1 %; 40 mg methylPREDNISolone acetate 40 MG/ML; 4 mL bupivacaine 0.25 % Outcome: tolerated well, no immediate complications Procedure, treatment alternatives, risks and benefits explained, specific risks discussed. Consent was given by the patient. Immediately prior to procedure a time out was called to verify the correct patient, procedure, equipment, support staff and site/side marked as required. Patient was prepped and draped in the usual sterile fashion.    Large Joint Inj: L knee on 09/11/2021 10:45 PM Indications: diagnostic evaluation, joint swelling and pain Details: 18 G 1.5 in needle, superolateral approach  Arthrogram: No  Medications: 5 mL lidocaine 1 %; 40 mg methylPREDNISolone acetate 40 MG/ML; 4 mL bupivacaine 0.25 % Outcome: tolerated well, no immediate complications Procedure, treatment alternatives, risks and benefits explained, specific risks discussed. Consent was given by the patient. Immediately prior to procedure a time out was called to verify the correct patient, procedure, equipment, support staff and site/side marked as required. Patient was prepped and draped in the usual sterile fashion.      Clinical Data: No additional findings.  Objective: Vital Signs: There were no vitals taken for this visit.  Physical Exam:   Constitutional: Patient appears  well-developed HEENT:  Head: Normocephalic Eyes:EOM are normal Neck: Normal range of motion Cardiovascular: Normal rate Pulmonary/chest: Effort normal Neurologic: Patient is alert Skin: Skin is warm Psychiatric: Patient has normal mood and affect   Ortho Exam: Ortho exam demonstrates slight flexion contracture right knee and left knee about 5 degrees.  Extensor mechanism intact.  No groin pain with internal/external Tatian of leg.  No masses lymphadenopathy or skin changes noted in the leg regions.  Pedal pulses palpable.  Ankle dorsiflexion intact.   Mild patellofemoral crepitus is present bilaterally.  Specialty Comments:  No specialty comments available.  Imaging: No results found.   PMFS History: Patient Active Problem List   Diagnosis Date Noted   Dysphagia 05/26/2021   Elevated lipase 05/26/2021   Hospital discharge follow-up 05/09/2021   Bilateral low back pain with sciatica 09/19/2019   Bilateral lower extremity edema 08/14/2019   S/P TAVR (transcatheter aortic valve replacement) 10/19/2017   Chronic fatigue 08/25/2017   DDD (degenerative disc disease), cervical 07/08/2017   Bursitis of left shoulder 07/08/2017   Fibromyalgia 07/08/2017   Atherosclerosis of abdominal aorta ([place]) 06/29/2017   Mixed hyperlipidemia 03/19/2017   Osteoporosis 04/21/2016   Internal hemorrhoids with other complication 66/29/4765   GI bleed 05/30/2013   NSAID long-term use 05/30/2013   Severe aortic stenosis 05/29/2013   DIVERTICULOSIS-COLON 01/13/2010   CONSTIPATION 01/13/2010   PERSONAL HISTORY OF COLONIC POLYPS 01/13/2010   Essential hypertension 08/28/2008   GERD 08/28/2008   PALPITATIONS 08/28/2008   Past Medical History:  Diagnosis Date   Chronic headaches    Fibromyalgia    GERD (gastroesophageal reflux disease)    Hemorrhoids    INTERNAL--  POST BANDING 02/ 2015   History of kidney stones    Hyperlipidemia    Hypertension    Moderate aortic stenosis    OSA (obstructive sleep apnea) CPAP INTOLERANT    Osteoporosis    S/P TAVR (transcatheter aortic valve replacement) 10/19/2017   20 mm Edwards Sapien 3 transcatheter heart valve placed via percutaneous right transfemoral approach    Severe aortic stenosis    Spinal stenosis, multilevel     Family History  Problem Relation Age of Onset   Cirrhosis Mother        drinking   Other Brother        duodenal ulcer   Heart disease Sister    Cancer Son 62       colon   Gallbladder disease Maternal Grandmother    Stomach cancer Paternal Grandfather     Past Surgical  History:  Procedure Laterality Date   ANTERIOR CERVICAL DECOMP/DISCECTOMY FUSION  11-11-1999   C5 - C6   APPENDECTOMY  AGE 33   CARDIAC CATHETERIZATION  2008  DR [place]   ESSENTIALLY NORMAL   CATARACT EXTRACTION Bilateral    CATARACT EXTRACTION W/ INTRAOCULAR LENS  IMPLANT, BILATERAL  2012   EYE SURGERY     FLEXIBLE SIGMOIDOSCOPY N/A 06/01/2013   Procedure: FLEXIBLE SIGMOIDOSCOPY;  Surgeon: Inda Castle, MD;  Location: [facility];  Service: Endoscopy;  Laterality: N/A;  may need hemorrhoidal banding   HEMORRHOIDECTOMY WITH HEMORRHOID BANDING  08-07-2010   IR RADIOLOGY PERIPHERAL GUIDED IV START  06/27/2019   IR US GUIDE VASC ACCESS RIGHT  06/27/2019   LAPAROSCOPIC CHOLECYSTECTOMY  1990   RIGHT/LEFT HEART CATH AND CORONARY ANGIOGRAPHY N/A 09/29/2017   Procedure: RIGHT/LEFT HEART CATH AND CORONARY ANGIOGRAPHY;  Surgeon: Sherren Mocha, MD;  Location: [facility];  Service: Cardiovascular;  Laterality: N/A;  SHOULDER ARTHROSCOPY WITH OPEN ROTATOR CUFF REPAIR AND DISTAL CLAVICLE ACROMINECTOMY Right 05/25/2012   Procedure: SHOULDER ARTHROSCOPY WITH OPEN ROTATOR CUFF REPAIR AND DISTAL CLAVICLE ACROMINECTOMY;  Surgeon: Magnus Sinning, MD;  Location: [facility];  Service: Orthopedics;  Laterality: Right;  RIGHT SHOULDER ARTHROSCOPY WITH DERBRIDEMENTOF LABRAL/BICEP TENDON, OPEN DISTAL CLAVICLE RESECTION, ANTERIOR ACROMINECTOMY ROTATOR CUFF REPAIR ANESTHESIA: GENERAL/SCALENE NERVE BLOCK   TEE WITHOUT CARDIOVERSION Bilateral 10/19/2017   Procedure: TRANSESOPHAGEAL ECHOCARDIOGRAM (TEE);  Surgeon: Sherren Mocha, MD;  Location: [facility];  Service: Open Heart Surgery;  Laterality: Bilateral;   TENOSYNOVECTOMY Left 01/04/2014   Procedure: LEFT WRIST EXTENSOR TENOSYNOVECTOMY;  Surgeon: Linna Hoff, MD;  Location: [facility];  Service: Orthopedics;  Laterality: Left;   THYROIDECTOMY  AGE 10   GOITER   TONSILLECTOMY AND ADENOIDECTOMY  AGE 29   TRANSCATHETER AORTIC  VALVE REPLACEMENT, TRANSFEMORAL  10/19/2017   TRANSCATHETER AORTIC VALVE REPLACEMENT, TRANSFEMORAL Bilateral 10/19/2017   Procedure: TRANSCATHETER AORTIC VALVE REPLACEMENT, TRANSFEMORAL;  Surgeon: Sherren Mocha, MD;  Location: [facility];  Service: Open Heart Surgery;  Laterality: Bilateral;   TRANSTHORACIC ECHOCARDIOGRAM  last one 04-20-2013  DR [facility]    NORMAL LVSF/ EF 60-65%/ MODERATE  AV  STENOSIS WITH NO AR /  MILD LAE   UMBILICAL HERNIA REPAIR  JUNE 2006   VAGINAL HYSTERECTOMY  01-31-2001   ANTERIOR & POSTERIOR REPAIR/ TRANSVAGINAL BLADDER SLING   Social History   Occupational History   Occupation: Retired  Tobacco Use   Smoking status: Former    Packs/day: 0.25    Years: 5.00    Pack years: 1.25    Types: Cigarettes    Quit date: 04/14/2003    Years since quitting: 18.4   Smokeless tobacco: Never  Vaping Use   Vaping Use: Never used  Substance and Sexual Activity   Alcohol use: Yes    Comment: once in a while-social   Drug use: Never    Comment: hemp oil    Sexual activity: Not Currently

## 2021-09-18 ENCOUNTER — Other Ambulatory Visit: Payer: Self-pay | Admitting: Family Medicine

## 2021-09-22 ENCOUNTER — Encounter: Payer: Self-pay | Admitting: Family Medicine

## 2021-09-22 ENCOUNTER — Telehealth: Payer: Self-pay | Admitting: Family Medicine

## 2021-09-22 ENCOUNTER — Ambulatory Visit (INDEPENDENT_AMBULATORY_CARE_PROVIDER_SITE_OTHER): Payer: Medicare Other | Admitting: Family Medicine

## 2021-09-22 VITALS — BP 130/46 | HR 63 | Temp 97.6°F | Ht 62.0 in | Wt 157.4 lb

## 2021-09-22 DIAGNOSIS — R300 Dysuria: Secondary | ICD-10-CM | POA: Diagnosis not present

## 2021-09-22 DIAGNOSIS — N3001 Acute cystitis with hematuria: Secondary | ICD-10-CM | POA: Diagnosis not present

## 2021-09-22 LAB — URINALYSIS, ROUTINE W REFLEX MICROSCOPIC
Bilirubin, UA: NEGATIVE
Glucose, UA: NEGATIVE
Nitrite, UA: NEGATIVE
Specific Gravity, UA: 1.02 (ref 1.005–1.030)
Urobilinogen, Ur: 0.2 mg/dL (ref 0.2–1.0)
pH, UA: 7 (ref 5.0–7.5)

## 2021-09-22 LAB — MICROSCOPIC EXAMINATION
Epithelial Cells (non renal): NONE SEEN /hpf (ref 0–10)
RBC, Urine: >30 /hpf — AB (ref 0–2)
Renal Epithel, UA: NONE SEEN /hpf
WBC, UA: >30 /hpf — AB (ref 0–5)

## 2021-09-22 MED ORDER — CEPHALEXIN 500 MG PO CAPS: 500.0000 mg | ORAL_CAPSULE | Freq: Two times a day (BID) | ORAL | 0 refills | Status: AC

## 2021-09-22 MED ORDER — PHENAZOPYRIDINE HCL 200 MG PO TABS: 200.0000 mg | ORAL_TABLET | Freq: Three times a day (TID) | ORAL | 0 refills | Status: AC | PRN

## 2021-09-22 NOTE — Telephone Encounter (Signed)
Pt called stating that she needs appt with Dr Lajuana Ripple for UTI.   Explained to pt that Dr Lajuana Ripple does not have any openings until June 26th. Offered her an appt to see a different provider today. Pt declined. Said she wanted to speak with Dr Alver Sorrow nurse because that's what she was told to do if she ever called and no appts were available and nurse would work her in to be seen.

## 2021-09-22 NOTE — Progress Notes (Signed)
Subjective: CC: UTI PCP: [name], DO HPI:[name] is a 86 y.o. female presenting to clinic today for:  1.  UTI Patient reports that she has been having increased urinary frequency, dysuria and some pelvic pain.  When she wiped she saw some blood.  She does not report any nausea, vomiting, flank pain or fevers.  Symptoms of been ongoing for about a week now.  This feels very similar to when she had a urinary tract infection a few years ago   ROS: Per HPI  Allergies  Allergen Reactions   Tape Other (See Comments)    Dermatitis rash "with extended exposure"   Prolia [Denosumab] Other (See Comments)    Arthralgia/ myalgia/ jaw pain/ headache   Past Medical History:  Diagnosis Date   Chronic headaches    Fibromyalgia    GERD (gastroesophageal reflux disease)    Hemorrhoids    INTERNAL--  POST BANDING 02/ 2015   History of kidney stones    Hyperlipidemia    Hypertension    Moderate aortic stenosis    OSA (obstructive sleep apnea) CPAP INTOLERANT    Osteoporosis    S/P TAVR (transcatheter aortic valve replacement) 10/19/2017   20 mm Edwards Sapien 3 transcatheter heart valve placed via percutaneous right transfemoral approach    Severe aortic stenosis    Spinal stenosis, multilevel     Current Outpatient Medications:    acetaminophen (TYLENOL) 500 MG tablet, Take 1,000 mg by mouth daily as needed for moderate pain or headache., Disp: , Rfl:    ALOE VERA PO, Take 1 capsule by mouth daily., Disp: , Rfl:    Ascorbic Acid (VITAMIN C) 1000 MG tablet, Take 1,000 mg by mouth daily., Disp: , Rfl:    aspirin EC 81 MG tablet, Take 1 tablet (81 mg total) by mouth daily., Disp: 90 tablet, Rfl: 3   BEE POLLEN PO, Take 400 mg by mouth daily., Disp: , Rfl:    clobetasol cream (TEMOVATE) 1.96 %, APPLY 1 APPLICATION TOPICALLY 2 (TWO) TIMES DAILY AS NEEDED., Disp: 30 g, Rfl: 3   Coenzyme Q10 (COQ-10) 100 MG CAPS, Take 100 mg by mouth daily., Disp: , Rfl:    doxycycline  (VIBRA-TABS) 100 MG tablet, Take 1 tablet (100 mg total) by mouth 2 (two) times daily., Disp: 42 tablet, Rfl: 0   furosemide (LASIX) 20 MG tablet, Take 1 tablet (20 mg total) by mouth daily., Disp: 90 tablet, Rfl: 3   losartan (COZAAR) 100 MG tablet, Take 1 tablet (100 mg total) by mouth daily., Disp: 90 tablet, Rfl: 3   metoprolol tartrate (LOPRESSOR) 50 MG tablet, TAKE 1 TABLET BY MOUTH TWICE A DAY, Disp: 180 tablet, Rfl: 2   Misc Natural Products (TART CHERRY ADVANCED PO), Take by mouth daily., Disp: , Rfl:    mupirocin ointment (BACTROBAN) 2 %, Apply to the affected area twice daily for 7-10 days, Disp: 22 g, Rfl: 0   OVER THE COUNTER MEDICATION, Take 1 capsule by mouth 2 (two) times daily. Hemp Oil, Disp: , Rfl:    rosuvastatin (CRESTOR) 5 MG tablet, TAKE 1 TABLET (5 MG TOTAL) BY MOUTH DAILY., Disp: 90 tablet, Rfl: 3   traMADol (ULTRAM) 50 MG tablet, 1 po q 12 prn pain, Disp: 30 tablet, Rfl: 0   traMADol (ULTRAM) 50 MG tablet, Take 1 tablet (50 mg total) by mouth every 8 (eight) hours as needed., Disp: 30 tablet, Rfl: 0   UNABLE TO FIND, Med Name: HEMP- Tumeric cream /  RUB, Disp: , Rfl:  Social History   Socioeconomic History   Marital status: Married    Spouse name: Shanon Brow   Number of children: 6   Years of education: 6   Highest education level: 6th grade  Occupational History   Occupation: Retired  Tobacco Use   Smoking status: Former    Packs/day: 0.25    Years: 5.00    Total pack years: 1.25    Types: Cigarettes    Quit date: 04/14/2003    Years since quitting: 18.4   Smokeless tobacco: Never  Vaping Use   Vaping Use: Never used  Substance and Sexual Activity   Alcohol use: Yes    Comment: once in a while-social   Drug use: Never    Comment: hemp oil    Sexual activity: Not Currently  Other Topics Concern   Not on file  Social History Narrative   Not on file   Social Determinants of Health   Financial Resource Strain: Not on file  Food Insecurity: Not on file   Transportation Needs: Not on file  Physical Activity: Inactive (12/08/2018)   Exercise Vital Sign    Days of Exercise per Week: 0 days    Minutes of Exercise per Session: 0 min  Stress: Not on file  Social Connections: Not on file  Intimate Partner Violence: Not on file   Family History  Problem Relation Age of Onset   Cirrhosis Mother        drinking   Other Brother        duodenal ulcer   Heart disease Sister    Cancer Son 11       colon   Gallbladder disease Maternal Grandmother    Stomach cancer Paternal Grandfather     Objective: Office vital signs reviewed. BP (!) 130/46   Pulse 63   Temp 97.6 F (36.4 C)   Ht '5\' 2"'$  (1.575 m)   Wt 157 lb 6.4 oz (71.4 kg)   SpO2 95%   BMI 28.79 kg/m   Physical Examination:  General: Awake, alert, well nourished, nontoxic.  No acute distress GU: No CVA tenderness  Assessment/ Plan: 86 y.o. female   Acute cystitis with hematuria - Plan: Urinalysis, Routine w reflex microscopic, cephALEXin (KEFLEX) 500 MG capsule, phenazopyridine (PYRIDIUM) 200 MG tablet, Urine Culture  UTI present on urinalysis.  Keflex twice daily sent.  Pyridium sent for pain.  Home care instructions reviewed and reasons for reevaluation discussed.  Follow-up as needed  Orders Placed This Encounter  Procedures   Urine Culture   Microscopic Examination   Urinalysis, Routine w reflex microscopic   No orders of the defined types were placed in this encounter.    [name], DO [place] [phone]

## 2021-09-22 NOTE — Telephone Encounter (Signed)
scheduled

## 2021-09-23 LAB — URINE CULTURE

## 2021-10-21 ENCOUNTER — Ambulatory Visit: Payer: Self-pay | Admitting: *Deleted

## 2021-10-21 NOTE — Chronic Care Management (AMB) (Signed)
  Chronic Care Management   Note  10/21/2021 Name: [name] MRN: [id number] DOB: May 05, 1935   Patient has not recently engaged with the Chronic Care Management RN Care Manager. Removing RN Care Manager from Care Team and closing [place]. If patient is currently engaged with another CCM team member I will forward this encounter to inform them of my case closure. Patient may be eligible for re-engagement with RN Care Manager in the future if necessary and can discuss this with their PCP.  Chong Sicilian, BSN, RN-BC Embedded Chronic Care Manager [facility] Direct Dial: [phone]

## 2021-11-21 ENCOUNTER — Other Ambulatory Visit: Payer: Self-pay | Admitting: Cardiovascular Disease

## 2021-11-28 ENCOUNTER — Encounter: Payer: Self-pay | Admitting: *Deleted

## 2021-11-28 ENCOUNTER — Ambulatory Visit: Payer: Self-pay | Admitting: *Deleted

## 2021-11-28 NOTE — Patient Outreach (Addendum)
  Care Coordination   Initial Visit Note   11/28/2021  Name: ANGLIA BLAKLEY MRN: [id number] DOB: [birth year]  [name] is a 86 y.o. year old female who sees [name], DO for primary care. I spoke with [name] by phone today.  What matters to the patients health and wellness today?  No Interventions Indicated.  CSW collaboration with scheduler at [facility] to schedule follow-up appointment for patient with her Primary Care Physician, Dr. Ronnie Doss.  Patient also reported extreme fluctuations in Blood Pressure readings.        Goals Addressed   None     SDOH assessments and interventions completed:  Yes.    SDOH Interventions Today    Flowsheet Row Most Recent Value  SDOH Interventions   Food Insecurity Interventions Intervention Not Indicated  Financial Strain Interventions Intervention Not Indicated  Housing Interventions Intervention Not Indicated  Physical Activity Interventions Patient Refused  Stress Interventions Intervention Not Indicated  Social Connections Interventions Intervention Not Indicated  Transportation Interventions Intervention Not Indicated        Care Coordination Interventions Activated:  Yes.   Care Coordination Interventions:  Yes, provided.   Follow up plan: No further intervention required.   Encounter Outcome:  Pt. Visit Completed.    Nat Christen, BSW, MSW, LCSW  Licensed Education officer, [facility]  Mailing [place] [address], [place], [place] [address] Physical Address-[address] [place], [place] [address] Toll Free Main # [phone] Fax # [phone] Cell # [phone] [email or web address]

## 2021-11-28 NOTE — Patient Instructions (Signed)
Visit Information  Thank you for taking time to visit with me today. Please don't hesitate to contact me if I can be of assistance to you.   Please call the care guide team at [phone] if you need to cancel or reschedule your appointment.   If you are experiencing a Mental Health or Behavioral Health Crisis or need someone to talk to, please call the Suicide and Crisis Lifeline: 988 call the [place] National Suicide Prevention Lifeline: 1-800-273-8255 or TTY: 1-800-799-4 TTY (1-800-799-4889) to talk to a trained counselor call 1-800-273-TALK (toll free, 24 hour hotline) go to [facility] [address], [place] ([phone]) call the [place] Crisis Line: 800-939-9988 call 911  Patient verbalizes understanding of instructions and care plan provided today and agrees to view in MyChart. Active MyChart status and patient understanding of how to access instructions and care plan via MyChart confirmed with patient.     No further follow up required.  Tammara Massing, BSW, MSW, LCSW  Licensed Clinical Social Worker  [facility]  Mailing Address-[address], [place], [place] [address] Physical Address-[address], [place], [place] [address] Toll Free Main # [phone] Fax # [phone] Cell # [phone] [email or web address]            

## 2021-12-02 ENCOUNTER — Encounter: Payer: Self-pay | Admitting: Family Medicine

## 2021-12-02 ENCOUNTER — Ambulatory Visit (INDEPENDENT_AMBULATORY_CARE_PROVIDER_SITE_OTHER): Payer: Medicare Other | Admitting: Family Medicine

## 2021-12-02 VITALS — BP 164/49 | HR 64 | Temp 96.4°F | Ht 62.0 in | Wt 159.0 lb

## 2021-12-02 DIAGNOSIS — Z952 Presence of prosthetic heart valve: Secondary | ICD-10-CM

## 2021-12-02 DIAGNOSIS — I10 Essential (primary) hypertension: Secondary | ICD-10-CM | POA: Diagnosis not present

## 2021-12-02 MED ORDER — AMOXICILLIN 500 MG PO CAPS: 2000.0000 mg | ORAL_CAPSULE | Freq: Once | ORAL | 1 refills | Status: DC

## 2021-12-02 MED ORDER — LOSARTAN POTASSIUM-HCTZ 100-25 MG PO TABS: 1.0000 | ORAL_TABLET | Freq: Every day | ORAL | 3 refills | Status: DC

## 2021-12-02 NOTE — Progress Notes (Signed)
Subjective: CC: Hypertension PCP: [name], DO HPI:[name] is a 86 y.o. female presenting to clinic today for:  1.  Uncontrolled hypertension Patient reports that her blood pressures have been running high for the last couple of weeks.  On average blood pressures are running 150s to 160s.  Sometimes she feels a little dizzy but overall she is asymptomatic.  She is worried about her heart given the blood pressure elevation.  She denies any changes in diet.  She tries to stay active but does admit to chronic fatigue.  Compliant with losartan 100 mg daily, Lopressor 50 mg twice daily and she has Lasix as well if needed.  No chest pain, shortness of breath.  She does have a dental procedure coming up and asks for some prophylactic antibiotics given history of TAVR   ROS: Per HPI  Allergies  Allergen Reactions   Tape Other (See Comments)    Dermatitis rash "with extended exposure"   Prolia [Denosumab] Other (See Comments)    Arthralgia/ myalgia/ jaw pain/ headache   Past Medical History:  Diagnosis Date   Chronic headaches    Fibromyalgia    GERD (gastroesophageal reflux disease)    Hemorrhoids    INTERNAL--  POST BANDING 02/ 2015   History of kidney stones    Hyperlipidemia    Hypertension    Moderate aortic stenosis    OSA (obstructive sleep apnea) CPAP INTOLERANT    Osteoporosis    S/P TAVR (transcatheter aortic valve replacement) 10/19/2017   20 mm Edwards Sapien 3 transcatheter heart valve placed via percutaneous right transfemoral approach    Severe aortic stenosis    Spinal stenosis, multilevel     Current Outpatient Medications:    acetaminophen (TYLENOL) 500 MG tablet, Take 1,000 mg by mouth daily as needed for moderate pain or headache., Disp: , Rfl:    ALOE VERA PO, Take 1 capsule by mouth daily., Disp: , Rfl:    Ascorbic Acid (VITAMIN C) 1000 MG tablet, Take 1,000 mg by mouth daily., Disp: , Rfl:    aspirin EC 81 MG tablet, Take 1 tablet (81 mg  total) by mouth daily., Disp: 90 tablet, Rfl: 3   BEE POLLEN PO, Take 400 mg by mouth daily., Disp: , Rfl:    clobetasol cream (TEMOVATE) 7.62 %, APPLY 1 APPLICATION TOPICALLY 2 (TWO) TIMES DAILY AS NEEDED., Disp: 30 g, Rfl: 3   Coenzyme Q10 (COQ-10) 100 MG CAPS, Take 100 mg by mouth daily., Disp: , Rfl:    furosemide (LASIX) 20 MG tablet, Take 1 tablet (20 mg total) by mouth daily., Disp: 90 tablet, Rfl: 3   losartan (COZAAR) 100 MG tablet, Take 1 tablet (100 mg total) by mouth daily., Disp: 90 tablet, Rfl: 3   metoprolol tartrate (LOPRESSOR) 50 MG tablet, TAKE 1 TABLET BY MOUTH TWICE A DAY, Disp: 180 tablet, Rfl: 1   Misc Natural Products (TART CHERRY ADVANCED PO), Take by mouth daily., Disp: , Rfl:    mupirocin ointment (BACTROBAN) 2 %, Apply to the affected area twice daily for 7-10 days, Disp: 22 g, Rfl: 0   OVER THE COUNTER MEDICATION, Take 1 capsule by mouth 2 (two) times daily. Hemp Oil, Disp: , Rfl:    rosuvastatin (CRESTOR) 5 MG tablet, TAKE 1 TABLET (5 MG TOTAL) BY MOUTH DAILY., Disp: 90 tablet, Rfl: 3   UNABLE TO FIND, Med Name: HEMP- Tumeric cream / RUB, Disp: , Rfl:    traMADol (ULTRAM) 50 MG tablet, 1 po  q 12 prn pain (Patient not taking: Reported on 12/02/2021), Disp: 30 tablet, Rfl: 0   traMADol (ULTRAM) 50 MG tablet, Take 1 tablet (50 mg total) by mouth every 8 (eight) hours as needed. (Patient not taking: Reported on 12/02/2021), Disp: 30 tablet, Rfl: 0 Social History   Socioeconomic History   Marital status: Married    Spouse name: Jayln Branscom   Number of children: 6   Years of education: 6   Highest education level: 6th grade  Occupational History   Occupation: Retired  Tobacco Use   Smoking status: Former    Packs/day: 0.25    Years: 5.00    Total pack years: 1.25    Types: Cigarettes    Quit date: 04/14/2003    Years since quitting: 18.6    Passive exposure: Past   Smokeless tobacco: Never  Vaping Use   Vaping Use: Never used  Substance and Sexual Activity    Alcohol use: Yes    Comment: once in a while-social   Drug use: Never    Comment: hemp oil    Sexual activity: Not Currently    Partners: Male  Other Topics Concern   Not on file  Social History Narrative   Not on file   Social Determinants of Health   Financial Resource Strain: Low Risk  (11/28/2021)   Overall Financial Resource Strain (CARDIA)    Difficulty of Paying Living Expenses: Not hard at all  Food Insecurity: No Food Insecurity (11/28/2021)   Hunger Vital Sign    Worried About Running Out of Food in the Last Year: Never true    [place] in the Last Year: Never true  Transportation Needs: No Transportation Needs (11/28/2021)   PRAPARE - Hydrologist (Medical): No    Lack of Transportation (Non-Medical): No  Physical Activity: Inactive (11/28/2021)   Exercise Vital Sign    Days of Exercise per Week: 0 days    Minutes of Exercise per Session: 0 min  Stress: No Stress Concern Present (11/28/2021)   [place]    Feeling of Stress : Only a little  Social Connections: Socially Integrated (11/28/2021)   Social Connection and Isolation Panel [NHANES]    Frequency of Communication with Friends and Family: More than three times a week    Frequency of Social Gatherings with Friends and Family: More than three times a week    Attends Religious Services: More than 4 times per year    Active Member of [employer] or Organizations: Yes    Attends Music therapist: More than 4 times per year    Marital Status: Married  Human resources officer Violence: Not At Risk (11/28/2021)   Humiliation, Afraid, Rape, and Kick questionnaire    Fear of Current or Ex-Partner: No    Emotionally Abused: No    Physically Abused: No    Sexually Abused: No   Family History  Problem Relation Age of Onset   Cirrhosis Mother        drinking   Other Brother        duodenal ulcer   Heart disease Sister     Cancer Son 48       colon   Gallbladder disease Maternal Grandmother    Stomach cancer Paternal Grandfather     Objective: Office vital signs reviewed. BP (!) 164/49   Pulse 64   Temp (!) 96.4 F (35.8 C) (Temporal)  Ht '5\' 2"'$  (1.575 m)   Wt 159 lb (72.1 kg)   SpO2 98%   BMI 29.08 kg/m   Physical Examination:  General: Awake, alert, nontoxic female, No acute distress HEENT: Sclera white Cardio: regular rate and rhythm, S1S2 heard, murmur cruciated Pulm: clear to auscultation bilaterally, no wheezes, rhonchi or rales; normal work of breathing on room air Extremities: warm, well perfused, No edema, cyanosis or clubbing; +2 pulses bilaterally MSK: Leading independently  Assessment/ Plan: 86 y.o. female   Essential hypertension - Plan: losartan-hydrochlorothiazide (HYZAAR) 100-25 MG tablet, Basic metabolic panel  S/P TAVR (transcatheter aortic valve replacement) - Plan: amoxicillin (AMOXIL) 500 MG capsule  Blood pressure is not well controlled.  I am going to add a diuretic to her losartan.  We discussed goal blood pressure less than 150/90.  Discussed potential for dizziness, hypokalemia.  Last potassium and renal function was normal.  Plan for BMP in 2 weeks to assess renal function given dual diuretics as well as potassium level.  Repeat blood pressure with nurse in 2 weeks as well and I will see her in the next 6 to 8 weeks for checkup here  Amoxicillin prophylaxis provided to the patient today for her upcoming dental procedure  No orders of the defined types were placed in this encounter.  No orders of the defined types were placed in this encounter.    [name], DO [place] [phone]

## 2021-12-16 ENCOUNTER — Ambulatory Visit (INDEPENDENT_AMBULATORY_CARE_PROVIDER_SITE_OTHER): Payer: Medicare Other | Admitting: *Deleted

## 2021-12-16 ENCOUNTER — Ambulatory Visit: Payer: Medicare Other

## 2021-12-16 DIAGNOSIS — I10 Essential (primary) hypertension: Secondary | ICD-10-CM

## 2021-12-16 NOTE — Progress Notes (Signed)
She is at her baseline.  Unlikely that her BP is contributing the her chronic fatigue but rather advanced cardiac disease and physical deconditioning.

## 2021-12-17 LAB — BASIC METABOLIC PANEL
BUN/Creatinine Ratio: 18 (ref 12–28)
BUN: 18 mg/dL (ref 8–27)
CO2: 25 mmol/L (ref 20–29)
Calcium: 9.6 mg/dL (ref 8.7–10.3)
Chloride: 103 mmol/L (ref 96–106)
Creatinine, Ser: 1 mg/dL (ref 0.57–1.00)
Glucose: 103 mg/dL — ABNORMAL HIGH (ref 70–99)
Potassium: 5.4 mmol/L — ABNORMAL HIGH (ref 3.5–5.2)
Sodium: 141 mmol/L (ref 134–144)
eGFR: 56 mL/min/{1.73_m2} — ABNORMAL LOW (ref >59)

## 2021-12-22 ENCOUNTER — Telehealth: Payer: Self-pay | Admitting: Cardiovascular Disease

## 2021-12-22 NOTE — Telephone Encounter (Signed)
Pt c/o BP issue: STAT if pt c/o blurred vision, one-sided weakness or slurred speech  1. What are your last 5 BP readings?  Patient states she will have readings available when the nurse calls back  2. Are you having any other symptoms (ex. Dizziness, headache, blurred vision, passed out)?  Headache, nausea, weakness in legs  3. What is your BP issue?  Patient states her BP has been fluctuating causing extreme fatigue.

## 2021-12-22 NOTE — Telephone Encounter (Signed)
Called patient back. Patient complaining of elevated BP and feeling extremely tired. Patient's medications were recently changed. Patient was on Losartan, then her BP changed her to Losartan/HCTZ 100 mg / 25 mg. Medication change was made on 12/02/21.This change has not improved patient's BP, per patient her BP 172/56 HR 55 and BP 157/57 HR 58. Since patient has history of Aortic stenosis with s/p TAVR made patient an appointment to see someone tomorrow.

## 2021-12-23 ENCOUNTER — Encounter: Payer: Self-pay | Admitting: Physician Assistant

## 2021-12-23 ENCOUNTER — Ambulatory Visit: Payer: Medicare Other | Attending: Physician Assistant | Admitting: Physician Assistant

## 2021-12-23 VITALS — BP 148/58 | HR 67 | Ht 62.0 in | Wt 157.0 lb

## 2021-12-23 DIAGNOSIS — I10 Essential (primary) hypertension: Secondary | ICD-10-CM | POA: Diagnosis not present

## 2021-12-23 DIAGNOSIS — Z7189 Other specified counseling: Secondary | ICD-10-CM | POA: Diagnosis not present

## 2021-12-23 DIAGNOSIS — R5382 Chronic fatigue, unspecified: Secondary | ICD-10-CM | POA: Diagnosis not present

## 2021-12-23 DIAGNOSIS — R002 Palpitations: Secondary | ICD-10-CM

## 2021-12-23 DIAGNOSIS — E785 Hyperlipidemia, unspecified: Secondary | ICD-10-CM | POA: Diagnosis not present

## 2021-12-23 DIAGNOSIS — Z952 Presence of prosthetic heart valve: Secondary | ICD-10-CM

## 2021-12-23 DIAGNOSIS — R609 Edema, unspecified: Secondary | ICD-10-CM

## 2021-12-23 MED ORDER — HYDRALAZINE HCL 25 MG PO TABS: 25.0000 mg | ORAL_TABLET | Freq: Three times a day (TID) | ORAL | 3 refills | Status: DC

## 2021-12-23 NOTE — Patient Instructions (Addendum)
Medication Instructions:  1.Start hydralazine 25 mg, three times daily *If you need a refill on your cardiac medications before your next appointment, please call your pharmacy*   Lab Work: None If you have labs (blood work) drawn today and your tests are completely normal, you will receive your results only by: [place] (if you have MyChart) OR A paper copy in the mail If you have any lab test that is abnormal or we need to change your treatment, we will call you to review the results.   Testing/Procedures: Your physician has requested that you have an echocardiogram prior to 01/01/22 office visit. Echocardiography is a painless test that uses sound waves to create images of your heart. It provides your doctor with information about the size and shape of your heart and how well your heart's chambers and valves are working. This procedure takes approximately one hour. There are no restrictions for this procedure.    Follow-Up: At [facility], you and your health needs are our priority.  As part of our continuing mission to provide you with exceptional heart care, we have created designated Provider Care Teams.  These Care Teams include your primary Cardiologist (physician) and Advanced Practice Providers (APPs -  Physician Assistants and Nurse Practitioners) who all work together to provide you with the care you need, when you need it.  Your next appointment:   01/01/22 at 11:30 AM  The format for your next appointment:   In Person  Provider:   Jenkins Rouge, MD    Other Instructions You have been referred to see Dr Apolonio Schneiders at [facility]  Two Gram Sodium Diet 2000 mg  What is Sodium? Sodium is a mineral found naturally in many foods. The most significant source of sodium in the diet is table salt, which is about 40% sodium.  Processed, convenience, and preserved foods also contain a large amount of sodium.  The body needs only 500 mg of sodium  daily to function,  A normal diet provides more than enough sodium even if you do not use salt.  Why Limit Sodium? A build up of sodium in the body can cause thirst, increased blood pressure, shortness of breath, and water retention.  Decreasing sodium in the diet can reduce edema and risk of heart attack or stroke associated with high blood pressure.  Keep in mind that there are many other factors involved in these health problems.  Heredity, obesity, lack of exercise, cigarette smoking, stress and what you eat all play a role.  General Guidelines: Do not add salt at the table or in cooking.  One teaspoon of salt contains over 2 grams of sodium. Read food labels Avoid processed and convenience foods Ask your dietitian before eating any foods not dicussed in the menu planning guidelines Consult your physician if you wish to use a salt substitute or a sodium containing medication such as antacids.  Limit milk and milk products to 16 oz (2 cups) per day.  Shopping Hints: READ LABELS!! "Dietetic" does not necessarily mean low sodium. Salt and other sodium ingredients are often added to foods during processing.    Menu Planning Guidelines Food Group Choose More Often Avoid  Beverages (see also the milk group All fruit juices, low-sodium, salt-free vegetables juices, low-sodium carbonated beverages Regular vegetable or tomato juices, commercially softened water used for drinking or cooking  Breads and Cereals Enriched white, wheat, rye and pumpernickel bread, hard rolls and dinner rolls; muffins, cornbread and waffles; most  dry cereals, cooked cereal without added salt; unsalted crackers and breadsticks; low sodium or homemade bread crumbs Bread, rolls and crackers with salted tops; quick breads; instant hot cereals; pancakes; commercial bread stuffing; self-rising flower and biscuit mixes; regular bread crumbs or cracker crumbs  Desserts and Sweets Desserts and sweets mad with mild should be within  allowance Instant pudding mixes and cake mixes  Fats Butter or margarine; vegetable oils; unsalted salad dressings, regular salad dressings limited to 1 Tbs; light, sour and heavy cream Regular salad dressings containing bacon fat, bacon bits, and salt pork; snack dips made with instant soup mixes or processed cheese; salted nuts  Fruits Most fresh, frozen and canned fruits Fruits processed with salt or sodium-containing ingredient (some dried fruits are processed with sodium sulfites        Vegetables Fresh, frozen vegetables and low- sodium canned vegetables Regular canned vegetables, sauerkraut, pickled vegetables, and others prepared in brine; frozen vegetables in sauces; vegetables seasoned with ham, bacon or salt pork  Condiments, Sauces, Miscellaneous  Salt substitute with physician's approval; pepper, herbs, spices; vinegar, lemon or lime juice; hot pepper sauce; garlic powder, onion powder, low sodium soy sauce (1 Tbs.); low sodium condiments (ketchup, chili sauce, mustard) in limited amounts (1 tsp.) fresh ground horseradish; unsalted tortilla chips, pretzels, potato chips, popcorn, salsa (1/4 cup) Any seasoning made with salt including garlic salt, celery salt, onion salt, and seasoned salt; sea salt, rock salt, kosher salt; meat tenderizers; monosodium glutamate; mustard, regular soy sauce, barbecue, sauce, chili sauce, teriyaki sauce, steak sauce, Worcestershire sauce, and most flavored vinegars; canned gravy and mixes; regular condiments; salted snack foods, olives, picles, relish, horseradish sauce, catsup   Food preparation: Try these seasonings Meats:    Pork Sage, onion Serve with applesauce  Chicken Poultry seasoning, thyme, parsley Serve with cranberry sauce  Lamb Curry powder, rosemary, garlic, thyme Serve with mint sauce or jelly  Veal Marjoram, basil Serve with current jelly, cranberry sauce  Beef Pepper, bay leaf Serve with dry mustard, unsalted chive butter  [place]  leaf, dill Serve with unsalted lemon butter, unsalted parsley butter  Vegetables:    Asparagus Lemon juice   Broccoli Lemon juice   Carrots Mustard dressing parsley, mint, nutmeg, glazed with unsalted butter and sugar   Green beans Marjoram, lemon juice, nutmeg,dill seed   Tomatoes Basil, marjoram, onion   Spice /blend for [employer]" 4 tsp ground thyme 1 tsp ground sage 3 tsp ground rosemary 4 tsp ground marjoram   Test your knowledge A product that says "Salt Free" may still contain sodium. True or False Garlic Powder and Hot Pepper Sauce an be used as alternative seasonings.True or False Processed foods have more sodium than fresh foods.  True or False Canned Vegetables have less sodium than froze True or False   WAYS TO DECREASE YOUR SODIUM INTAKE Avoid the use of added salt in cooking and at the table.  Table salt (and other prepared seasonings which contain salt) is probably one of the greatest sources of sodium in the diet.  Unsalted foods can gain flavor from the sweet, sour, and butter taste sensations of herbs and spices.  Instead of using salt for seasoning, try the following seasonings with the foods listed.  Remember: how you use them to enhance natural food flavors is limited only by your creativity... Allspice-Meat, fish, eggs, fruit, peas, red and yellow vegetables Almond Extract-Fruit baked goods Anise Seed-Sweet breads, fruit, carrots, beets, cottage cheese, cookies (tastes like licorice) Basil-Meat, fish,  eggs, vegetables, rice, vegetables salads, soups, sauces Bay Leaf-Meat, fish, stews, poultry Burnet-Salad, vegetables (cucumber-like flavor) Caraway Seed-Bread, cookies, cottage cheese, meat, vegetables, cheese, rice Cardamon-Baked goods, fruit, soups Celery Powder or seed-Salads, salad dressings, sauces, meatloaf, soup, bread.Do not use  celery salt Chervil-Meats, salads, fish, eggs, vegetables, cottage cheese (parsley-like flavor) Chili Power-Meatloaf, chicken  cheese, corn, eggplant, egg dishes Chives-Salads cottage cheese, egg dishes, soups, vegetables, sauces Cilantro-Salsa, casseroles Cinnamon-Baked goods, fruit, pork, lamb, chicken, carrots Cloves-Fruit, baked goods, fish, pot roast, green beans, beets, carrots Coriander-Pastry, cookies, meat, salads, cheese (lemon-orange flavor) Cumin-Meatloaf, fish,cheese, eggs, cabbage,fruit pie (caraway flavor) [employer], fruit, eggs, fish, poultry, cottage cheese, vegetables Dill Seed-Meat, cottage cheese, poultry, vegetables, fish, salads, bread Fennel Seed-Bread, cookies, apples, pork, eggs, fish, beets, cabbage, cheese, Licorice-like flavor Garlic-(buds or powder) Salads, meat, poultry, fish, bread, butter, vegetables, potatoes.Do not  use garlic salt Ginger-Fruit, vegetables, baked goods, meat, fish, poultry Horseradish Root-Meet, vegetables, butter Lemon Juice or Extract-Vegetables, fruit, tea, baked goods, fish salads Mace-Baked goods fruit, vegetables, fish, poultry (taste like nutmeg) Maple Extract-Syrups Marjoram-Meat, chicken, fish, vegetables, breads, green salads (taste like Sage) Mint-Tea, lamb, sherbet, vegetables, desserts, carrots, cabbage Mustard, Dry or Seed-Cheese, eggs, meats, vegetables, poultry Nutmeg-Baked goods, fruit, chicken, eggs, vegetables, desserts Onion Powder-Meat, fish, poultry, vegetables, cheese, eggs, bread, rice salads (Do not use   Onion salt) Orange Extract-Desserts, baked goods Oregano-Pasta, eggs, cheese, onions, pork, lamb, fish, chicken, vegetables, green salads Paprika-Meat, fish, poultry, eggs, cheese, vegetables Parsley Flakes-Butter, vegetables, meat fish, poultry, eggs, bread, salads (certain forms may   Contain sodium Pepper-Meat fish, poultry, vegetables, eggs Peppermint Extract-Desserts, baked goods Poppy Seed-Eggs, bread, cheese, fruit dressings, baked goods, noodles, vegetables, cottage  [employer],  poultry, meat, fish, cauliflower, turnips,eggs bread Saffron-Rice, bread, veal, chicken, fish, eggs Sage-Meat, fish, poultry, onions, eggplant, tomateos, pork, stews Savory-Eggs, salads, poultry, meat, rice, vegetables, soups, pork Tarragon-Meat, poultry, fish, eggs, butter, vegetables (licorice-like flavor)  Thyme-Meat, poultry, fish, eggs, vegetables, (clover-like flavor), sauces, soups Tumeric-Salads, butter, eggs, fish, rice, vegetables (saffron-like flavor) Vanilla Extract-Baked goods, candy Vinegar-Salads, vegetables, meat marinades Walnut Extract-baked goods, candy   2. Choose your Foods Wisely   The following is a list of foods to avoid which are high in sodium:  Meats-Avoid all smoked, canned, salt cured, dried and kosher meat and fish as well as Anchovies   [employer] meats:Bologna, Liverwurst, Pastrami Canned meat or fish  Marinated herring Caviar    Pepperoni Corned Beef   Pizza Dried chipped beef  Salami Frozen breaded fish or meat Salt pork Frankfurters or hot dogs  Sardines Gefilte fish   Sausage Ham (boiled ham, Proscuitto Smoked butt    spiced ham)   Spam      TV Dinners Vegetables Canned vegetables (Regular) Relish Canned mushrooms  Sauerkraut Olives    Tomato juice Pickles  Bakery and Dessert Products Canned puddings  Cream pies Cheesecake   Decorated cakes Cookies  Beverages/Juices Tomato juice, regular  Gatorade   V-8 vegetable juice, regular  Breads and Cereals Biscuit mixes   Salted potato chips, corn chips, pretzels Bread stuffing mixes  Salted crackers and rolls Pancake and waffle mixes Self-rising flour  Seasonings Accent    Meat sauces Barbecue sauce  Meat tenderizer Catsup    Monosodium glutamate (MSG) Celery salt   Onion salt Chili sauce   Prepared mustard Garlic salt   Salt, seasoned salt, sea salt Gravy mixes   Soy sauce Horseradish   Steak sauce Ketchup   Tartar  sauce Lite salt    Teriyaki sauce Marinade  mixes   Worcestershire sauce  Others Baking powder   Cocoa and cocoa mixes Baking soda   Commercial casserole mixes Candy-caramels, chocolate  Dehydrated soups    Bars, fudge,nougats  Instant rice and pasta mixes Canned broth or soup  Maraschino cherries Cheese, aged and processed cheese and cheese spreads  Learning Assessment Quiz  Indicated T (for True) or F (for False) for each of the following statements:  _____ Fresh fruits and vegetables and unprocessed grains are generally low in sodium _____ Water may contain a considerable amount of sodium, depending on the source _____ You can always tell if a food is high in sodium by tasting it _____ Certain laxatives my be high in sodium and should be avoided unless prescribed   by a physician or pharmacist _____ Salt substitutes may be used freely by anyone on a sodium restricted diet _____ Sodium is present in table salt, food additives and as a natural component of   most foods _____ Table salt is approximately 90% sodium _____ Limiting sodium intake may help prevent excess fluid accumulation in the body _____ On a sodium-restricted diet, seasonings such as bouillon soy sauce, and    cooking wine should be used in place of table salt _____ On an ingredient list, a product which lists monosodium glutamate as the first   ingredient is an appropriate food to include on a low sodium diet  Circle the best answer(s) to the following statements (Hint: there may be more than one correct answer)  11. On a low-sodium diet, some acceptable snack items are:    A. Olives  F. Bean dip   K. Grapefruit juice    B. Salted Pretzels G. Commercial Popcorn   L. Canned peaches    C. Carrot Sticks  H. Bouillon   M. Unsalted nuts   D. [place] fries  I. Peanut butter crackers N. Salami   E. Sweet pickles J. Tomato Juice   O. Pizza  12.  Seasonings that may be used freely on a reduced - sodium diet include   A. Lemon wedges F.Monosodium glutamate K.  Celery seed    B.Soysauce   G. Pepper   L. Mustard powder   C. Sea salt  H. Cooking wine  M. Onion flakes   D. Vinegar  E. Prepared horseradish N. Salsa   E. Sage   J. Worcestershire sauce  O. Chutney   Important Information About Sugar

## 2021-12-23 NOTE — Progress Notes (Signed)
Cardiology Office Note:    Date:  12/23/2021   ID:  [name], DOB 1935/08/05, MRN [id number]  PCP:  Megan Norlander, DO  [facility] Prov[facility] Cardiologist:  Jenkins Rouge, MD     Referring MD: Megan Norlander, DO   Chief Complaint:  Hypertension     History of Present Illness:   [name] is a 86 y.o. female with  history of HTN, Severe AS TAVR done 10/19/17 with 20 mm Sapien valve stable moderate PVL by last TTE 02/20/20 mean gradient 16 mmHg. Cath 09/29/17 prior to TAVR with no CAD, palpitations, HLD, fatigue.  Patient saw Dr. Johnsie [name] 05/12/21 and cozaar increased 100 mg daily.   PCP added HCTZ to losartan 12/02/21 for elevated BP's  Patient added onto my schedule for elevated BP's and extreme fatigue. Patient has a lot of arthritis and having a lot of pain and taking magnesium that helps. She lost 2 sons and a brother and his wife. Her son committed suicide. Tearful and gets upset and wants to lay down. Says she's not depressed and is very strong. She's surrounded by grandchildren.  Has lost about 10 lbs.       Past Medical History:  Diagnosis Date   Chronic headaches    Fibromyalgia    GERD (gastroesophageal reflux disease)    Hemorrhoids    INTERNAL--  POST BANDING 02/ 2015   History of kidney stones    Hyperlipidemia    Hypertension    Moderate aortic stenosis    OSA (obstructive sleep apnea) CPAP INTOLERANT    Osteoporosis    S/P TAVR (transcatheter aortic valve replacement) 10/19/2017   20 mm Edwards Sapien 3 transcatheter heart valve placed via percutaneous right transfemoral approach    Severe aortic stenosis    Spinal stenosis, multilevel    Current Medications: Current Meds  Medication Sig   acetaminophen (TYLENOL) 500 MG tablet Take 1,000 mg by mouth daily as needed for moderate pain or headache.   ALOE VERA PO Take 1 capsule by mouth daily.   Ascorbic Acid (VITAMIN C) 1000 MG tablet Take 1,000 mg by mouth daily.   aspirin EC 81 MG tablet  Take 1 tablet (81 mg total) by mouth daily.   BEE POLLEN PO Take 400 mg by mouth daily.   clobetasol cream (TEMOVATE) 9.52 % APPLY 1 APPLICATION TOPICALLY 2 (TWO) TIMES DAILY AS NEEDED.   Coenzyme Q10 (COQ-10) 100 MG CAPS Take 100 mg by mouth daily.   furosemide (LASIX) 20 MG tablet Take 1 tablet (20 mg total) by mouth daily.   Glucosamine-Chondroit-Vit C-Mn (GLUCOSAMINE 1500 COMPLEX PO) Take by mouth.   hydrALAZINE (APRESOLINE) 25 MG tablet Take 1 tablet (25 mg total) by mouth 3 (three) times daily.   losartan-hydrochlorothiazide (HYZAAR) 100-25 MG tablet Take 1 tablet by mouth daily. CHANGE in therapy   Magnesium 500 MG CAPS Take by mouth.   metoprolol tartrate (LOPRESSOR) 50 MG tablet TAKE 1 TABLET BY MOUTH TWICE A DAY   Misc Natural Products (TART CHERRY ADVANCED PO) Take by mouth daily.   mupirocin ointment (BACTROBAN) 2 % Apply to the affected area twice daily for 7-10 days   OVER THE COUNTER MEDICATION Take 1 capsule by mouth 2 (two) times daily. Hemp Oil   rosuvastatin (CRESTOR) 5 MG tablet TAKE 1 TABLET (5 MG TOTAL) BY MOUTH DAILY.   traMADol (ULTRAM) 50 MG tablet 1 po q 12 prn pain   traMADol (ULTRAM) 50 MG tablet Take 1 tablet (  50 mg total) by mouth every 8 (eight) hours as needed.   UNABLE TO FIND Med Name: HEMP- Tumeric cream / RUB    Allergies:   Tape and Prolia [denosumab]   Social History   Tobacco Use   Smoking status: Former    Packs/day: 0.25    Years: 5.00    Total pack years: 1.25    Types: Cigarettes    Quit date: 04/14/2003    Years since quitting: 18.7    Passive exposure: Past   Smokeless tobacco: Never  Vaping Use   Vaping Use: Never used  Substance Use Topics   Alcohol use: Yes    Comment: once in a while-social   Drug use: Never    Comment: hemp oil     Family Hx: The patient's family history includes Cancer (age of onset: 58) in her son; Cirrhosis in her mother; Gallbladder disease in her maternal grandmother; Heart disease in her sister; Other in  her brother; Stomach cancer in her paternal grandfather.  ROS   EKGs/Labs/Other Test Reviewed:    EKG:  EKG is  not ordered today.     Recent Labs: 05/08/2021: ALT 19; Hemoglobin 12.0; Platelets 208 12/16/2021: BUN 18; Creatinine, Ser 1.00; Potassium 5.4; Sodium 141   Recent Lipid Panel No results for input(s): "CHOL", "TRIG", "HDL", "VLDL", "LDLCALC", "LDLDIRECT" in the last 8760 hours.   Prior CV Studies:   2D ECHO  02/20/20    Study Conclusions   - Left ventricle: The cavity size was normal. There was mild focal    basal hypertrophy of the septum. Systolic function was vigorous.    The estimated ejection fraction was in the range of 65% to 70%.    Wall motion was normal; there were no regional wall motion    abnormalities. Doppler parameters are consistent with abnormal    left ventricular relaxation (grade 1 diastolic dysfunction).  - Aortic valve: A new bioprosthesis was present and functioning    normally. The prosthesis had a normal range of motion. The device    appeared normal, had no rocking motion, and showed no evidence of    dehiscence. There was trivial regurgitation. Mean gradient (S):    12 mm Hg. Valve area (VTI): 1.95 cm^2. Indexed valve area (VTI):    1.08 cm^2/m^2. Valve area (Vmax): 1.67 cm^2. Valve area (Vmean):    1.7 cm^2.  - Mitral valve: Calcified annulus.   Impressions:   - Post-TAVR, the new bioprosthesis appears well seated without    significant stenosis. Very trivial AR. LV EF is normal.      RIGHT/LEFT HEART CATH AND CORONARY ANGIOGRAPHY  09/29/17  Conclusion    1.  Known severe aortic stenosis by noninvasive assessment with heavily calcified and restricted aortic valve leaflets biplane fluoroscopy 2.  Heavy mitral annular calcium 3.  Widely patent coronary arteries with minor luminal irregularities 4.  Mildly elevated right heart pressures    Recommendation: Continued multidisciplinary heart team evaluation for treatment of severe aortic  stenosis         Risk Assessment/Calculations/Metrics:         HYPERTENSION CONTROL Vitals:   12/23/21 1306 12/23/21 1335  BP: (!) 182/62 (!) 148/58    The patient's blood pressure is elevated above target today.  In order to address the patient's elevated BP: A new medication was prescribed today.       Physical Exam:    VS:  BP (!) 148/58   Pulse 67   Ht 5'  2" (1.575 m)   Wt 157 lb (71.2 kg)   SpO2 97%   BMI 28.72 kg/m     Wt Readings from Last 3 Encounters:  12/23/21 157 lb (71.2 kg)  12/02/21 159 lb (72.1 kg)  09/22/21 157 lb 6.4 oz (71.4 kg)    Physical Exam  GEN: Well nourished, well developed, in no acute distress  Neck: no JVD, carotid bruits, or masses Cardiac:RRR; 2/6 systolic & diastolic murmur LSB, Respiratory:  clear to auscultation bilaterally, normal work of breathing GI: soft, nontender, nondistended, + BS Ext: without cyanosis, clubbing, or edema, Good distal pulses bilaterally Neuro:  Alert and Oriented x 3,  Psych: euthymic mood, full affect       ASSESSMENT & PLAN:   No problem-specific Assessment & Plan notes found for this encounter.   HTN - losartan to 100 mg daily and HCTZ added 12/02/21 by PCP due to AR/TAVR -BP running high will add hydralazine 25 mg tid -2 gm sodium diet    Severe AS s/p TVAR - suboptimal result with small 20 mm Sapien valve and moderate PVL TTE 02/20/20 with mean gradient stable 16 mmHg and DVI ok 0.55  -will schedule echo since it's been 2 yrs and she says she can't walk any distance at all. F/u with Dr. Johnsie [name]  Palpitations -   Zio monitor done 02/2018 no arrhythmia continue lopressor     Fatigue/Mailaise:  chronic but patient says getting worse-can't walk in grocery store or exercise. Repeat echo.   HLD:  Continue statin labs with primary    Edema:  Better off norvasc lasix prn but no swelling since on HCTZ   Counseling for bereavement refer to Dr. Cheryln Manly              Dispo:  No follow-ups on  file.   Medication Adjustments/Labs and Tests Ordered: Current medicines are reviewed at length with the patient today.  Concerns regarding medicines are outlined above.  Tests Ordered: Orders Placed This Encounter  Procedures   Ambulatory referral to Psychology   ECHOCARDIOGRAM COMPLETE   Medication Changes: Meds ordered this encounter  Medications   hydrALAZINE (APRESOLINE) 25 MG tablet    Sig: Take 1 tablet (25 mg total) by mouth 3 (three) times daily.    Dispense:  270 tablet    Refill:  3   Signed, Ermalinda Barrios, PA-C  12/23/2021 1:55 PM    [address], [place], [place]  [address] Phone: [phone]; Fax: [phone]

## 2021-12-24 ENCOUNTER — Ambulatory Visit (HOSPITAL_COMMUNITY): Payer: Medicare Other | Attending: Cardiovascular Disease

## 2021-12-24 DIAGNOSIS — Z952 Presence of prosthetic heart valve: Secondary | ICD-10-CM

## 2021-12-24 LAB — ECHOCARDIOGRAM COMPLETE
AR max vel: 0.93 cm2
AV Area VTI: 1.07 cm2
AV Area mean vel: 1.21 cm2
AV Mean grad: 13 mmHg
AV Peak grad: 28.5 mmHg
Ao pk vel: 2.67 m/s
Area-P 1/2: 2.24 cm2
P 1/2 time: 278 msec
S' Lateral: 2.7 cm

## 2021-12-29 NOTE — Progress Notes (Signed)
Cardiology Office Note:    Date:  12/29/2021   ID:  [name], DOB 1935/08/09, MRN [id number]  PCP:  [name], DO  [facility] Cardiologist:  Jenkins Rouge, MD     Referring MD: [name], DO   Chief Complaint:  No chief complaint on file.     History of Present Illness:   [name] is a 86 y.o. female with  history of HTN, Severe AS TAVR done 10/19/17 with 20 mm Sapien valve stable moderate PVL by last TTE 02/20/20 mean gradient 16 mmHg. Cath 09/29/17 prior to TAVR with no CAD, palpitations, HLD, fatigue.  Seen 05/12/21 and cozaar increased 100 mg daily.   PCP added HCTZ to losartan 12/02/21 for elevated BP's  Patient added onto my schedule for elevated BP's and extreme fatigue.  She is depressed and less functional Patient has a lot of arthritis and having a lot of pain and taking magnesium that helps. She lost 2 sons and a brother and his wife. Her son committed suicide. Tearful and gets upset and wants to lay down. Says she's not depressed and is very strong. She's surrounded by grandchildren.  Has lost about 10 lbs.   Seen by PA 12/23/21 and hydralazine added TTE 12/24/21 stable moderate PVL mean gradient 13 mmHg and EF remained normal with only mild MR  ***      Past Medical History:  Diagnosis Date  . Chronic headaches   . Fibromyalgia   . GERD (gastroesophageal reflux disease)   . Hemorrhoids    INTERNAL--  POST BANDING 02/ 2015  . History of kidney stones   . Hyperlipidemia   . Hypertension   . Moderate aortic stenosis   . OSA (obstructive sleep apnea) CPAP INTOLERANT   . Osteoporosis   . S/P TAVR (transcatheter aortic valve replacement) 10/19/2017   20 mm Edwards Sapien 3 transcatheter heart valve placed via percutaneous right transfemoral approach   . Severe aortic stenosis   . Spinal stenosis, multilevel    Current Medications: No outpatient medications have been marked as taking for the 01/01/22 encounter (Appointment) with  [name], MD.    Allergies:   Tape and Prolia [denosumab]   Social History   Tobacco Use  . Smoking status: Former    Packs/day: 0.25    Years: 5.00    Total pack years: 1.25    Types: Cigarettes    Quit date: 04/14/2003    Years since quitting: 18.7    Passive exposure: Past  . Smokeless tobacco: Never  Vaping Use  . Vaping Use: Never used  Substance Use Topics  . Alcohol use: Yes    Comment: once in a while-social  . Drug use: Never    Comment: hemp oil     Family Hx: The patient's family history includes Cancer (age of onset: 31) in her son; Cirrhosis in her mother; Gallbladder disease in her maternal grandmother; Heart disease in her sister; Other in her brother; Stomach cancer in her paternal grandfather.  ROS   EKGs/Labs/Other Test Reviewed:    EKG:  EKG is  not ordered today.     Recent Labs: 05/08/2021: ALT 19; Hemoglobin 12.0; Platelets 208 12/16/2021: BUN 18; Creatinine, Ser 1.00; Potassium 5.4; Sodium 141   Recent Lipid Panel No results for input(s): "CHOL", "TRIG", "HDL", "VLDL", "LDLCALC", "LDLDIRECT" in the last 8760 hours.   Prior CV Studies:   2D ECHO  02/20/20    Study Conclusions   -  Left ventricle: The cavity size was normal. There was mild focal    basal hypertrophy of the septum. Systolic function was vigorous.    The estimated ejection fraction was in the range of 65% to 70%.    Wall motion was normal; there were no regional wall motion    abnormalities. Doppler parameters are consistent with abnormal    left ventricular relaxation (grade 1 diastolic dysfunction).  - Aortic valve: A new bioprosthesis was present and functioning    normally. The prosthesis had a normal range of motion. The device    appeared normal, had no rocking motion, and showed no evidence of    dehiscence. There was trivial regurgitation. Mean gradient (S):    12 mm Hg. Valve area (VTI): 1.95 cm^2. Indexed valve area (VTI):    1.08 cm^2/m^2. Valve area (Vmax): 1.67  cm^2. Valve area (Vmean):    1.7 cm^2.  - Mitral valve: Calcified annulus.   Impressions:   - Post-TAVR, the new bioprosthesis appears well seated without    significant stenosis. Very trivial AR. LV EF is normal.      RIGHT/LEFT HEART CATH AND CORONARY ANGIOGRAPHY  09/29/17  Conclusion    1.  Known severe aortic stenosis by noninvasive assessment with heavily calcified and restricted aortic valve leaflets biplane fluoroscopy 2.  Heavy mitral annular calcium 3.  Widely patent coronary arteries with minor luminal irregularities 4.  Mildly elevated right heart pressures    Recommendation: Continued multidisciplinary heart team evaluation for treatment of severe aortic stenosis         Risk Assessment/Calculations/Metrics:         No BP recorded.  {Refresh Note OR Click here to enter BP  :1}***     Physical Exam:    VS:  There were no vitals taken for this visit.    Wt Readings from Last 3 Encounters:  12/23/21 157 lb (71.2 kg)  12/02/21 159 lb (72.1 kg)  09/22/21 157 lb 6.4 oz (71.4 kg)    Physical Exam   Depressed/Anxious  Healthy:  appears stated age 82: normal Neck supple with no adenopathy JVP normal no bruits no thyromegaly Lungs clear with no wheezing and good diaphragmatic motion Heart:  S1/S2 no murmur, no rub, gallop or click PMI normal Abdomen: benighn, BS positve, no tenderness, no AAA no bruit.  No HSM or HJR Distal pulses intact with no bruits No edema Neuro non-focal Skin warm and dry No muscular weakness       ASSESSMENT & PLAN:   No problem-specific Assessment & Plan notes found for this encounter.   HTN - losartan to 100 mg daily and HCTZ added 12/02/21 by PCP due to AR/TAVR -Hydralazine added PA 12/23/21  -2 gm sodium diet    Severe AS s/p TVAR - suboptimal result with small 20 mm Sapien valve and moderate PVL TTE 12/24/21 stable -  mean gradient 13 mmHg   Palpitations -   Zio monitor done 02/2018 no arrhythmia continue lopressor      Fatigue/Mailaise:  chronic related to age and depression f/u primary    Edema:  Better off norvasc and on diuretic    Counseling for bereavement refer to Dr. Cheryln Manly      Dispo:  No follow-ups on file.   Medication Adjustments/Labs and Tests Ordered: Current medicines are reviewed at length with the patient today.  Concerns regarding medicines are outlined above.  Tests Ordered: No orders of the defined types were placed in this encounter.  Medication Changes:  No orders of the defined types were placed in this encounter.  Signed, Jenkins Rouge, MD  12/29/2021 7:43 AM    [facility] [address], [place], [place]  [address] Phone: [phone]; Fax: [phone]

## 2022-01-01 ENCOUNTER — Encounter: Payer: Self-pay | Admitting: Cardiovascular Disease

## 2022-01-01 ENCOUNTER — Ambulatory Visit: Payer: Medicare Other | Attending: Cardiovascular Disease | Admitting: Cardiovascular Disease

## 2022-01-01 VITALS — BP 154/60 | HR 62 | Ht 62.0 in | Wt 156.0 lb

## 2022-01-01 DIAGNOSIS — I10 Essential (primary) hypertension: Secondary | ICD-10-CM

## 2022-01-01 DIAGNOSIS — R5382 Chronic fatigue, unspecified: Secondary | ICD-10-CM

## 2022-01-01 DIAGNOSIS — Z952 Presence of prosthetic heart valve: Secondary | ICD-10-CM

## 2022-01-01 DIAGNOSIS — E782 Mixed hyperlipidemia: Secondary | ICD-10-CM | POA: Diagnosis not present

## 2022-01-01 NOTE — Patient Instructions (Signed)
Medication Instructions:  Your physician recommends that you continue on your current medications as directed. Please refer to the Current Medication list given to you today.  *If you need a refill on your cardiac medications before your next appointment, please call your pharmacy*  Lab Work: If you have labs (blood work) drawn today and your tests are completely normal, you will receive your results only by: MyChart Message (if you have MyChart) OR A paper copy in the mail If you have any lab test that is abnormal or we need to change your treatment, we will call you to review the results.  Testing/Procedures: None ordered today.  Follow-Up: At [facility], you and your health needs are our priority.  As part of our continuing mission to provide you with exceptional heart care, we have created designated Provider Care Teams.  These Care Teams include your primary Cardiologist (physician) and Advanced Practice Providers (APPs -  Physician Assistants and Nurse Practitioners) who all work together to provide you with the care you need, when you need it.  We recommend signing up for the patient portal called "MyChart".  Sign up information is provided on this After Visit Summary.  MyChart is used to connect with patients for Virtual Visits (Telemedicine).  Patients are able to view lab/test results, encounter notes, upcoming appointments, etc.  Non-urgent messages can be sent to your provider as well.   To learn more about what you can do with MyChart, go to [email or web address].    Your next appointment:   12 month(s)  The format for your next appointment:   In Person  Provider:   Peter Nishan, MD      Important Information About Sugar       

## 2022-01-07 ENCOUNTER — Encounter: Payer: Self-pay | Admitting: Family Medicine

## 2022-01-07 ENCOUNTER — Ambulatory Visit (INDEPENDENT_AMBULATORY_CARE_PROVIDER_SITE_OTHER): Payer: Medicare Other | Admitting: Family Medicine

## 2022-01-07 VITALS — BP 141/57 | HR 57 | Temp 97.6°F | Ht 62.0 in | Wt 159.6 lb

## 2022-01-07 DIAGNOSIS — I10 Essential (primary) hypertension: Secondary | ICD-10-CM

## 2022-01-07 DIAGNOSIS — Z952 Presence of prosthetic heart valve: Secondary | ICD-10-CM

## 2022-01-07 DIAGNOSIS — R0989 Other specified symptoms and signs involving the circulatory and respiratory systems: Secondary | ICD-10-CM | POA: Diagnosis not present

## 2022-01-07 NOTE — Patient Instructions (Signed)
Hydralazine was the last medication added.  If it is making you feel bad, ok to use AS NEEDED for Blood pressure over 150/90. Referral to Blood pressure clinic ordered today.

## 2022-01-07 NOTE — Progress Notes (Signed)
Subjective: CC: Follow-up hypertension PCP: [name], DO HPI:[name] is a 86 y.o. female presenting to clinic today for:  1.  Hypertension Patient reports that blood pressure continues to be labile at home.  She has seen systolic blood pressures anywhere from 121-179.  When it is on the higher end, she often will have headaches and sometimes will have morning nausea.  No neurologic changes reported.  She does give out easily but denies any shortness of breath or loss of consciousness.  She tries to stay physically active but is not able to do the same things that she used to.   ROS: Per HPI  Allergies  Allergen Reactions   Tape Other (See Comments)    Dermatitis rash "with extended exposure"   Prolia [Denosumab] Other (See Comments)    Arthralgia/ myalgia/ jaw pain/ headache   Past Medical History:  Diagnosis Date   Chronic headaches    Fibromyalgia    GERD (gastroesophageal reflux disease)    Hemorrhoids    INTERNAL--  POST BANDING 02/ 2015   History of kidney stones    Hyperlipidemia    Hypertension    Moderate aortic stenosis    OSA (obstructive sleep apnea) CPAP INTOLERANT    Osteoporosis    S/P TAVR (transcatheter aortic valve replacement) 10/19/2017   20 mm Edwards Sapien 3 transcatheter heart valve placed via percutaneous right transfemoral approach    Severe aortic stenosis    Spinal stenosis, multilevel     Current Outpatient Medications:    acetaminophen (TYLENOL) 500 MG tablet, Take 1,000 mg by mouth daily as needed for moderate pain or headache., Disp: , Rfl:    ALOE VERA PO, Take 1 capsule by mouth daily., Disp: , Rfl:    Ascorbic Acid (VITAMIN C) 1000 MG tablet, Take 1,000 mg by mouth daily., Disp: , Rfl:    aspirin EC 81 MG tablet, Take 1 tablet (81 mg total) by mouth daily., Disp: 90 tablet, Rfl: 3   BEE POLLEN PO, Take 400 mg by mouth daily., Disp: , Rfl:    clobetasol cream (TEMOVATE) 5.17 %, APPLY 1 APPLICATION TOPICALLY 2 (TWO) TIMES  DAILY AS NEEDED., Disp: 30 g, Rfl: 3   Coenzyme Q10 (COQ-10) 100 MG CAPS, Take 100 mg by mouth daily., Disp: , Rfl:    Glucosamine-Chondroit-Vit C-Mn (GLUCOSAMINE 1500 COMPLEX PO), Take by mouth., Disp: , Rfl:    hydrALAZINE (APRESOLINE) 25 MG tablet, Take 1 tablet (25 mg total) by mouth 3 (three) times daily., Disp: 270 tablet, Rfl: 3   losartan-hydrochlorothiazide (HYZAAR) 100-25 MG tablet, Take 1 tablet by mouth daily. CHANGE in therapy, Disp: 90 tablet, Rfl: 3   Magnesium 500 MG CAPS, Take by mouth., Disp: , Rfl:    metoprolol tartrate (LOPRESSOR) 50 MG tablet, TAKE 1 TABLET BY MOUTH TWICE A DAY, Disp: 180 tablet, Rfl: 1   Misc Natural Products (TART CHERRY ADVANCED PO), Take by mouth daily., Disp: , Rfl:    mupirocin ointment (BACTROBAN) 2 %, Apply to the affected area twice daily for 7-10 days, Disp: 22 g, Rfl: 0   OVER THE COUNTER MEDICATION, Take 1 capsule by mouth 2 (two) times daily. Hemp Oil, Disp: , Rfl:    rosuvastatin (CRESTOR) 5 MG tablet, TAKE 1 TABLET (5 MG TOTAL) BY MOUTH DAILY., Disp: 90 tablet, Rfl: 3   traMADol (ULTRAM) 50 MG tablet, 1 po q 12 prn pain, Disp: 30 tablet, Rfl: 0   traMADol (ULTRAM) 50 MG tablet, Take 1 tablet (  50 mg total) by mouth every 8 (eight) hours as needed., Disp: 30 tablet, Rfl: 0   UNABLE TO FIND, Med Name: HEMP- Tumeric cream / RUB, Disp: , Rfl:    furosemide (LASIX) 20 MG tablet, Take 1 tablet (20 mg total) by mouth daily. (Patient not taking: Reported on 01/01/2022), Disp: 90 tablet, Rfl: 3 Social History   Socioeconomic History   Marital status: Married    Spouse name: Zuly Belkin   Number of children: 6   Years of education: 6   Highest education level: 6th grade  Occupational History   Occupation: Retired  Tobacco Use   Smoking status: Former    Packs/day: 0.25    Years: 5.00    Total pack years: 1.25    Types: Cigarettes    Quit date: 04/14/2003    Years since quitting: 18.7    Passive exposure: Past   Smokeless tobacco: Never   Vaping Use   Vaping Use: Never used  Substance and Sexual Activity   Alcohol use: Yes    Comment: once in a while-social   Drug use: Never    Comment: hemp oil    Sexual activity: Not Currently    Partners: Male  Other Topics Concern   Not on file  Social History Narrative   Not on file   Social Determinants of Health   Financial Resource Strain: Low Risk  (11/28/2021)   Overall Financial Resource Strain (CARDIA)    Difficulty of Paying Living Expenses: Not hard at all  Food Insecurity: No Food Insecurity (11/28/2021)   Hunger Vital Sign    Worried About Running Out of Food in the Last Year: Never true    [place] in the Last Year: Never true  Transportation Needs: No Transportation Needs (11/28/2021)   PRAPARE - Hydrologist (Medical): No    Lack of Transportation (Non-Medical): No  Physical Activity: Inactive (11/28/2021)   Exercise Vital Sign    Days of Exercise per Week: 0 days    Minutes of Exercise per Session: 0 min  Stress: No Stress Concern Present (11/28/2021)   [place]    Feeling of Stress : Only a little  Social Connections: Socially Integrated (11/28/2021)   Social Connection and Isolation Panel [NHANES]    Frequency of Communication with Friends and Family: More than three times a week    Frequency of Social Gatherings with Friends and Family: More than three times a week    Attends Religious Services: More than 4 times per year    Active Member of [employer] or Organizations: Yes    Attends Music therapist: More than 4 times per year    Marital Status: Married  Human resources officer Violence: Not At Risk (11/28/2021)   Humiliation, Afraid, Rape, and Kick questionnaire    Fear of Current or Ex-Partner: No    Emotionally Abused: No    Physically Abused: No    Sexually Abused: No   Family History  Problem Relation Age of Onset   Cirrhosis Mother         drinking   Other Brother        duodenal ulcer   Heart disease Sister    Cancer Son 96       colon   Gallbladder disease Maternal Grandmother    Stomach cancer Paternal Grandfather     Objective: Office vital signs reviewed. BP (!) 141/57   Pulse Marland Kitchen)  57   Temp 97.6 F (36.4 C)   Ht '5\' 2"'$  (1.575 m)   Wt 159 lb 9.6 oz (72.4 kg)   SpO2 98%   BMI 29.19 kg/m   Physical Examination:  General: Awake, alert, nontoxic elderly female, No acute distress HEENT: Sclera white.  Moist mucous membranes Cardio: regular rate and rhythm, D5D8 heard, systolic murmur appreciated Pulm: clear to auscultation bilaterally, no wheezes, rhonchi or rales; normal work of breathing on room air    Assessment/ Plan: 86 y.o. female   Labile blood pressure  Essential hypertension - Plan: Basic Metabolic Panel, Ambulatory referral to Advanced Hypertension Clinic - [facility]  S/P TAVR (transcatheter aortic valve replacement)  Continues to have quite labile blood pressures.  Since she has been feeling so poorly on the hydralazine I have advised her to use that as needed BP above 150/90.  I will recheck her potassium and renal function levels as there was a slight abnormality noted a couple of weeks ago.  Her blood pressure upon recheck is within acceptable range during today's visit.  Continue all other meds as directed.  We will refer to advanced hypertension clinic for opinions on medication management given her heart history and symptomology  No orders of the defined types were placed in this encounter.  No orders of the defined types were placed in this encounter.    [name], DO [place] [phone]

## 2022-01-08 LAB — BASIC METABOLIC PANEL
BUN/Creatinine Ratio: 18 (ref 12–28)
BUN: 20 mg/dL (ref 8–27)
CO2: 23 mmol/L (ref 20–29)
Calcium: 9.7 mg/dL (ref 8.7–10.3)
Chloride: 100 mmol/L (ref 96–106)
Creatinine, Ser: 1.11 mg/dL — ABNORMAL HIGH (ref 0.57–1.00)
Glucose: 107 mg/dL — ABNORMAL HIGH (ref 70–99)
Potassium: 5.2 mmol/L (ref 3.5–5.2)
Sodium: 136 mmol/L (ref 134–144)
eGFR: 49 mL/min/{1.73_m2} — ABNORMAL LOW (ref >59)

## 2022-03-02 ENCOUNTER — Ambulatory Visit: Payer: Medicare Other | Admitting: Cardiovascular Disease

## 2022-03-11 NOTE — Progress Notes (Incomplete)
Advanced Hypertension Clinic Initial Assessment:    Date:  03/11/2022   ID:  [name], DOB 02-03-1936, MRN [id number]  PCP:  [name], DO  Cardiologist:  Jenkins Rouge, MD  Nephrologist:  Referring MD: [name], DO   CC: Hypertension  History of Present Illness:    [name] is a 86 y.o. female with a hx of hypertension, *** here to establish care in the [facility].   Today,  *** denies any palpitations, chest pain, shortness of breath, or peripheral edema. No lightheadedness, headaches, syncope, orthopnea, or PND.  (+)  Previous antihypertensives:   Secondary Causes of Hypertension  Medications/Herbal: OCP, steroids, stimulants, antidepressants, weight loss medication, immune suppressants, NSAIDs, sympathomimetics, alcohol, caffeine, licorice, ginseng, St. John's wort, chemo  Sleep Apnea Renal artery stenosis Hyperaldosteronism Hyper/hypothyroidism Pheochromocytoma: palpitations, tachycardia, headache, diaphoresis (plasma metanephrines) Cushing's syndrome: Cushingoid facies, central obesity, proximal muscle weakness, and ecchymoses, adrenal incidentaloma (cortisol) Coarctation of the aorta  Past Medical History:  Diagnosis Date   Chronic headaches    Fibromyalgia    GERD (gastroesophageal reflux disease)    Hemorrhoids    INTERNAL--  POST BANDING 02/ 2015   History of kidney stones    Hyperlipidemia    Hypertension    Moderate aortic stenosis    OSA (obstructive sleep apnea) CPAP INTOLERANT    Osteoporosis    S/P TAVR (transcatheter aortic valve replacement) 10/19/2017   20 mm Edwards Sapien 3 transcatheter heart valve placed via percutaneous right transfemoral approach    Severe aortic stenosis    Spinal stenosis, multilevel     Past Surgical History:  Procedure Laterality Date   ANTERIOR CERVICAL DECOMP/DISCECTOMY FUSION  11-11-1999   C5 - C6   APPENDECTOMY  AGE 59   CARDIAC CATHETERIZATION  2008  DR  NISHAN   ESSENTIALLY NORMAL   CATARACT EXTRACTION Bilateral    CATARACT EXTRACTION W/ INTRAOCULAR LENS  IMPLANT, BILATERAL  2012   EYE SURGERY     FLEXIBLE SIGMOIDOSCOPY N/A 06/01/2013   Procedure: FLEXIBLE SIGMOIDOSCOPY;  Surgeon: Inda Castle, MD;  Location: [facility];  Service: Endoscopy;  Laterality: N/A;  may need hemorrhoidal banding   HEMORRHOIDECTOMY WITH HEMORRHOID BANDING  08-07-2010   IR RADIOLOGY PERIPHERAL GUIDED IV START  06/27/2019   IR US GUIDE VASC ACCESS RIGHT  06/27/2019   LAPAROSCOPIC CHOLECYSTECTOMY  1990   RIGHT/LEFT HEART CATH AND CORONARY ANGIOGRAPHY N/A 09/29/2017   Procedure: RIGHT/LEFT HEART CATH AND CORONARY ANGIOGRAPHY;  Surgeon: Sherren Mocha, MD;  Location: [facility];  Service: Cardiovascular;  Laterality: N/A;   SHOULDER ARTHROSCOPY WITH OPEN ROTATOR CUFF REPAIR AND DISTAL CLAVICLE ACROMINECTOMY Right 05/25/2012   Procedure: SHOULDER ARTHROSCOPY WITH OPEN ROTATOR CUFF REPAIR AND DISTAL CLAVICLE ACROMINECTOMY;  Surgeon: Magnus Sinning, MD;  Location: [place];  Service: Orthopedics;  Laterality: Right;  RIGHT SHOULDER ARTHROSCOPY WITH DERBRIDEMENTOF LABRAL/BICEP TENDON, OPEN DISTAL CLAVICLE RESECTION, ANTERIOR ACROMINECTOMY ROTATOR CUFF REPAIR ANESTHESIA: GENERAL/SCALENE NERVE BLOCK   TEE WITHOUT CARDIOVERSION Bilateral 10/19/2017   Procedure: TRANSESOPHAGEAL ECHOCARDIOGRAM (TEE);  Surgeon: Sherren Mocha, MD;  Location: [place];  Service: Open Heart Surgery;  Laterality: Bilateral;   TENOSYNOVECTOMY Left 01/04/2014   Procedure: LEFT WRIST EXTENSOR TENOSYNOVECTOMY;  Surgeon: Linna Hoff, MD;  Location: [facility];  Service: Orthopedics;  Laterality: Left;   THYROIDECTOMY  AGE 34   GOITER   TONSILLECTOMY AND ADENOIDECTOMY  AGE 3   TRANSCATHETER AORTIC VALVE REPLACEMENT, TRANSFEMORAL  10/19/2017  TRANSCATHETER AORTIC VALVE REPLACEMENT, TRANSFEMORAL Bilateral 10/19/2017   Procedure: TRANSCATHETER AORTIC VALVE  REPLACEMENT, TRANSFEMORAL;  Surgeon: Sherren Mocha, MD;  Location: [place];  Service: Open Heart Surgery;  Laterality: Bilateral;   TRANSTHORACIC ECHOCARDIOGRAM  last one 04-20-2013  DR [facility]    NORMAL LVSF/ EF 60-65%/ MODERATE  AV  STENOSIS WITH NO AR /  MILD LAE   UMBILICAL HERNIA REPAIR  JUNE 2006   VAGINAL HYSTERECTOMY  01-31-2001   ANTERIOR & POSTERIOR REPAIR/ TRANSVAGINAL BLADDER SLING    Current Medications: No outpatient medications have been marked as taking for the 03/12/22 encounter (Appointment) with Skeet Latch, MD.     Allergies:   Tape and Prolia [denosumab]   Social History   Socioeconomic History   Marital status: Married    Spouse name: Zemira Zehring   Number of children: 6   Years of education: 6   Highest education level: 6th grade  Occupational History   Occupation: Retired  Tobacco Use   Smoking status: Former    Packs/day: 0.25    Years: 5.00    Total pack years: 1.25    Types: Cigarettes    Quit date: 04/14/2003    Years since quitting: 18.9    Passive exposure: Past   Smokeless tobacco: Never  Vaping Use   Vaping Use: Never used  Substance and Sexual Activity   Alcohol use: Yes    Comment: once in a while-social   Drug use: Never    Comment: hemp oil    Sexual activity: Not Currently    Partners: Male  Other Topics Concern   Not on file  Social History Narrative   Not on file   Social Determinants of Health   Financial Resource Strain: Low Risk  (11/28/2021)   Overall Financial Resource Strain (CARDIA)    Difficulty of Paying Living Expenses: Not hard at all  Food Insecurity: No Food Insecurity (11/28/2021)   Hunger Vital Sign    Worried About Running Out of Food in the Last Year: Never true    [place] in the Last Year: Never true  Transportation Needs: No Transportation Needs (11/28/2021)   PRAPARE - Hydrologist (Medical): No    Lack of Transportation (Non-Medical): No  Physical Activity:  Inactive (11/28/2021)   Exercise Vital Sign    Days of Exercise per Week: 0 days    Minutes of Exercise per Session: 0 min  Stress: No Stress Concern Present (11/28/2021)   [place]    Feeling of Stress : Only a little  Social Connections: Socially Integrated (11/28/2021)   Social Connection and Isolation Panel [NHANES]    Frequency of Communication with Friends and Family: More than three times a week    Frequency of Social Gatherings with Friends and Family: More than three times a week    Attends Religious Services: More than 4 times per year    Active Member of [employer] or Organizations: Yes    Attends Music therapist: More than 4 times per year    Marital Status: Married     Family History: The patient's family history includes Cancer (age of onset: 11) in her son; Cirrhosis in her mother; Gallbladder disease in her maternal grandmother; Heart disease in her sister; Other in her brother; Stomach cancer in her paternal grandfather.  ROS:   Please see the history of present illness.     All other systems reviewed  and are negative.  EKGs/Labs/Other Studies Reviewed:    ***  EKG:   : Sinus ***. Rate *** bpm.  Recent Labs: 05/08/2021: ALT 19; Hemoglobin 12.0; Platelets 208 01/07/2022: BUN 20; Creatinine, Ser 1.11; Potassium 5.2; Sodium 136   Recent Lipid Panel    Component Value Date/Time   CHOL 145 05/25/2017 0844   CHOL 219 (H) 09/07/2012 0935   TRIG 136 05/25/2017 0844   TRIG 180 (H) 02/17/2013 1007   TRIG 326 (H) 09/07/2012 0935   HDL 47 05/25/2017 0844   HDL 51 02/17/2013 1007   HDL 47 09/07/2012 0935   CHOLHDL 3.1 05/25/2017 0844   CHOLHDL 5.2 10/09/2006 0435   VLDL 58 (H) 10/09/2006 0435   LDLCALC 71 05/25/2017 0844   LDLCALC 89 02/17/2013 1007   LDLCALC 107 (H) 09/07/2012 0935    Physical Exam:    VS:  There were no vitals taken for this visit. , BMI There is no height or weight  on file to calculate BMI. GENERAL:  Well appearing HEENT: Pupils equal round and reactive, fundi not visualized, oral mucosa unremarkable NECK:  No jugular venous distention, waveform within normal limits, carotid upstroke brisk and symmetric, no bruits, no thyromegaly LYMPHATICS:  No cervical adenopathy LUNGS:  Clear to auscultation bilaterally HEART:  RRR.  PMI not displaced or sustained,S1 and S2 within normal limits, no S3, no S4, no clicks, no rubs, *** murmurs ABD:  Flat, positive bowel sounds normal in frequency in pitch, no bruits, no rebound, no guarding, no midline pulsatile mass, no hepatomegaly, no splenomegaly EXT:  2 plus pulses throughout, no edema, no cyanosis no clubbing SKIN:  No rashes no nodules NEURO:  Cranial nerves II through XII grossly intact, motor grossly intact throughout PSYCH:  Cognitively intact, oriented to person place and time   ASSESSMENT/PLAN:    No problem-specific Assessment & Plan notes found for this encounter.   Screening for Secondary Hypertension: { Click here to document screening for secondary causes of HTN  :[id number]}    Relevant Labs/Studies:    Latest Ref Rng & Units 01/07/2022    2:12 PM 12/16/2021    2:56 PM 05/08/2021    7:57 PM  Basic Labs  Sodium 134 - 144 mmol/L 136  141  139   Potassium 3.5 - 5.2 mmol/L 5.2  5.4  4.7   Creatinine 0.57 - 1.00 mg/dL 1.11  1.00  0.82        Latest Ref Rng & Units 02/12/2020    4:22 PM 09/07/2019   11:00 AM  Thyroid   TSH 0.450 - 4.500 uIU/mL 2.840  2.730                     she consents to be monitored in our remote patient monitoring program through [facility].  she will track his blood pressure twice daily and understands that these trends will help [place] to adjust her medications as needed prior to his next appointment.  she *** interested in enrolling in the PREP exercise and nutrition program through the [facility].     Disposition:   *** FU with APP/PharmD in 1 month for the next 3 months.    FU with Tiffany C. Oval Linsey, MD, [facility] in 4 months.   Medication Adjustments/Labs and Tests Ordered: Current medicines are reviewed at length with the patient today.  Concerns regarding medicines are outlined above.   No orders of the defined types were placed in this encounter.  No orders of the defined  types were placed in this encounter.   I,Mathew Stumpf,acting as a Education administrator for Skeet Latch, MD.,have documented all relevant documentation on the behalf of Skeet Latch, MD,as directed by  Skeet Latch, MD while in the presence of Skeet Latch, MD.  ***  Signed, Madelin Rear  03/11/2022 2:28 PM    [place]

## 2022-03-12 ENCOUNTER — Ambulatory Visit (HOSPITAL_BASED_OUTPATIENT_CLINIC_OR_DEPARTMENT_OTHER): Payer: Medicare Other | Admitting: Cardiovascular Disease

## 2022-03-12 ENCOUNTER — Encounter (HOSPITAL_BASED_OUTPATIENT_CLINIC_OR_DEPARTMENT_OTHER): Payer: Self-pay

## 2022-04-19 ENCOUNTER — Other Ambulatory Visit: Payer: Self-pay | Admitting: Cardiovascular Disease

## 2022-04-19 ENCOUNTER — Other Ambulatory Visit: Payer: Self-pay | Admitting: Family

## 2022-04-19 DIAGNOSIS — W57XXXA Bitten or stung by nonvenomous insect and other nonvenomous arthropods, initial encounter: Secondary | ICD-10-CM

## 2022-05-05 ENCOUNTER — Encounter (HOSPITAL_BASED_OUTPATIENT_CLINIC_OR_DEPARTMENT_OTHER): Payer: Self-pay | Admitting: Cardiovascular Disease

## 2022-05-05 ENCOUNTER — Ambulatory Visit (HOSPITAL_BASED_OUTPATIENT_CLINIC_OR_DEPARTMENT_OTHER): Payer: Medicare Other | Admitting: Cardiovascular Disease

## 2022-05-05 VITALS — BP 138/52 | HR 65 | Ht 62.0 in | Wt 153.2 lb

## 2022-05-05 DIAGNOSIS — E7800 Pure hypercholesterolemia, unspecified: Secondary | ICD-10-CM

## 2022-05-05 DIAGNOSIS — R0602 Shortness of breath: Secondary | ICD-10-CM

## 2022-05-05 DIAGNOSIS — I10 Essential (primary) hypertension: Secondary | ICD-10-CM

## 2022-05-05 DIAGNOSIS — Z952 Presence of prosthetic heart valve: Secondary | ICD-10-CM

## 2022-05-05 DIAGNOSIS — I700 Atherosclerosis of aorta: Secondary | ICD-10-CM | POA: Diagnosis not present

## 2022-05-05 DIAGNOSIS — R002 Palpitations: Secondary | ICD-10-CM | POA: Diagnosis not present

## 2022-05-05 MED ORDER — SPIRONOLACTONE 25 MG PO TABS: 25.0000 mg | ORAL_TABLET | Freq: Every day | ORAL | 3 refills | Status: DC

## 2022-05-05 MED ORDER — AMLODIPINE BESYLATE 5 MG PO TABS: 5.0000 mg | ORAL_TABLET | Freq: Every day | ORAL | 3 refills | Status: DC

## 2022-05-05 MED ORDER — HYDRALAZINE HCL 25 MG PO TABS: 25.0000 mg | ORAL_TABLET | Freq: Two times a day (BID) | ORAL | 3 refills | Status: DC

## 2022-05-05 NOTE — Assessment & Plan Note (Signed)
LDL goal is less than 70.  She will come back for fasting lipids and a CMP.  Continue rosuvastatin.  Continue aspirin.

## 2022-05-05 NOTE — Patient Instructions (Addendum)
Medication Instructions:  START AMLODIPINE 5 MG DAILY   CHANGE YOUR HYDRALAZINE TO 1 TABLET IN THE MORNING AND 1 TABLET IN THE EVENING (12 HOURS APART)    Labwork: FASTING LP/CMET/TSH/CBC/CATACHOLAMINES/METANEPHRINES IN 1 WEEK     Testing/Procedures: NONE   Follow-Up: 06/04/2022 10:00 AM WITH PHARM D    Special Instructions:  MONITOR YOUR BLOOD PRESSURE TWICE A DAY, LOG IN THE BOOK PROVIDED. BRING THE BOOK AND YOUR BLOOD PRESSURE MACHINE TO YOUR FOLLOW UP IN 1 MONTH  DASH Eating Plan DASH stands for "Dietary Approaches to Stop Hypertension." The DASH eating plan is a healthy eating plan that has been shown to reduce high blood pressure (hypertension). It may also reduce your risk for type 2 diabetes, heart disease, and stroke. The DASH eating plan may also help with weight loss. What are tips for following this plan?  General guidelines Avoid eating more than 2,300 mg (milligrams) of salt (sodium) a day. If you have hypertension, you may need to reduce your sodium intake to 1,500 mg a day. Limit alcohol intake to no more than 1 drink a day for nonpregnant women and 2 drinks a day for men. One drink equals 12 oz of beer, 5 oz of wine, or 1 oz of hard liquor. Work with your health care provider to maintain a healthy body weight or to lose weight. Ask what an ideal weight is for you. Get at least 30 minutes of exercise that causes your heart to beat faster (aerobic exercise) most days of the week. Activities may include walking, swimming, or biking. Work with your health care provider or diet and nutrition specialist (dietitian) to adjust your eating plan to your individual calorie needs. Reading food labels  Check food labels for the amount of sodium per serving. Choose foods with less than 5 percent of the Daily Value of sodium. Generally, foods with less than 300 mg of sodium per serving fit into this eating plan. To find whole grains, look for the word "whole" as the first word in the  ingredient list. Shopping Buy products labeled as "low-sodium" or "no salt added." Buy fresh foods. Avoid canned foods and premade or frozen meals. Cooking Avoid adding salt when cooking. Use salt-free seasonings or herbs instead of table salt or sea salt. Check with your health care provider or pharmacist before using salt substitutes. Do not fry foods. Cook foods using healthy methods such as baking, boiling, grilling, and broiling instead. Cook with heart-healthy oils, such as olive, canola, soybean, or sunflower oil. Meal planning Eat a balanced diet that includes: 5 or more servings of fruits and vegetables each day. At each meal, try to fill half of your plate with fruits and vegetables. Up to 6-8 servings of whole grains each day. Less than 6 oz of lean meat, poultry, or fish each day. A 3-oz serving of meat is about the same size as a deck of cards. One egg equals 1 oz. 2 servings of low-fat dairy each day. A serving of nuts, seeds, or beans 5 times each week. Heart-healthy fats. Healthy fats called Omega-3 fatty acids are found in foods such as flaxseeds and coldwater fish, like sardines, salmon, and mackerel. Limit how much you eat of the following: Canned or prepackaged foods. Food that is high in trans fat, such as fried foods. Food that is high in saturated fat, such as fatty meat. Sweets, desserts, sugary drinks, and other foods with added sugar. Full-fat dairy products. Do not salt foods before eating. Try  to eat at least 2 vegetarian meals each week. Eat more home-cooked food and less restaurant, buffet, and fast food. When eating at a restaurant, ask that your food be prepared with less salt or no salt, if possible. What foods are recommended? The items listed may not be a complete list. Talk with your dietitian about what dietary choices are best for you. Grains Whole-grain or whole-wheat bread. Whole-grain or whole-wheat pasta. Brown rice. [place]. Bulgur.  Whole-grain and low-sodium cereals. Pita bread. Low-fat, low-sodium crackers. Whole-wheat flour tortillas. Vegetables Fresh or frozen vegetables (raw, steamed, roasted, or grilled). Low-sodium or reduced-sodium tomato and vegetable juice. Low-sodium or reduced-sodium tomato sauce and tomato paste. Low-sodium or reduced-sodium canned vegetables. Fruits All fresh, dried, or frozen fruit. Canned fruit in natural juice (without added sugar). Meat and other protein foods Skinless chicken or [place]. Ground chicken or [place]. Pork with fat trimmed off. Fish and seafood. Egg whites. Dried beans, peas, or lentils. Unsalted nuts, nut butters, and seeds. Unsalted canned beans. Lean cuts of beef with fat trimmed off. Low-sodium, lean deli meat. Dairy Low-fat (1%) or fat-free (skim) milk. Fat-free, low-fat, or reduced-fat cheeses. Nonfat, low-sodium ricotta or cottage cheese. Low-fat or nonfat yogurt. Low-fat, low-sodium cheese. Fats and oils Soft margarine without trans fats. Vegetable oil. Low-fat, reduced-fat, or light mayonnaise and salad dressings (reduced-sodium). Canola, safflower, olive, soybean, and sunflower oils. Avocado. Seasoning and other foods Herbs. Spices. Seasoning mixes without salt. Unsalted popcorn and pretzels. Fat-free sweets. What foods are not recommended? The items listed may not be a complete list. Talk with your dietitian about what dietary choices are best for you. Grains Baked goods made with fat, such as croissants, muffins, or some breads. Dry pasta or rice meal packs. Vegetables Creamed or fried vegetables. Vegetables in a cheese sauce. Regular canned vegetables (not low-sodium or reduced-sodium). Regular canned tomato sauce and paste (not low-sodium or reduced-sodium). Regular tomato and vegetable juice (not low-sodium or reduced-sodium). Angie Fava. Olives. Fruits Canned fruit in a light or heavy syrup. Fried fruit. Fruit in cream or butter sauce. Meat and other protein  foods Fatty cuts of meat. Ribs. Fried meat. Berniece Salines. Sausage. Bologna and other processed lunch meats. Salami. Fatback. Hotdogs. Bratwurst. Salted nuts and seeds. Canned beans with added salt. Canned or smoked fish. Whole eggs or egg yolks. Chicken or [place] with skin. Dairy Whole or 2% milk, cream, and half-and-half. Whole or full-fat cream cheese. Whole-fat or sweetened yogurt. Full-fat cheese. Nondairy creamers. Whipped toppings. Processed cheese and cheese spreads. Fats and oils Butter. Stick margarine. Lard. Shortening. Ghee. Bacon fat. Tropical oils, such as coconut, palm kernel, or palm oil. Seasoning and other foods Salted popcorn and pretzels. Onion salt, garlic salt, seasoned salt, table salt, and sea salt. Worcestershire sauce. Tartar sauce. Barbecue sauce. Teriyaki sauce. Soy sauce, including reduced-sodium. Steak sauce. Canned and packaged gravies. Fish sauce. Oyster sauce. Cocktail sauce. Horseradish that you find on the shelf. Ketchup. Mustard. Meat flavorings and tenderizers. Bouillon cubes. Hot sauce and Tabasco sauce. Premade or packaged marinades. Premade or packaged taco seasonings. Relishes. Regular salad dressings. Where to find more information: National Heart, Lung, and [place]: https://wilson-eaton.com/ American Heart Association: www.heart.org Summary The DASH eating plan is a healthy eating plan that has been shown to reduce high blood pressure (hypertension). It may also reduce your risk for type 2 diabetes, heart disease, and stroke. With the DASH eating plan, you should limit salt (sodium) intake to 2,300 mg a day. If you have hypertension, you may need to  reduce your sodium intake to 1,500 mg a day. When on the DASH eating plan, aim to eat more fresh fruits and vegetables, whole grains, lean proteins, low-fat dairy, and heart-healthy fats. Work with your health care provider or diet and nutrition specialist (dietitian) to adjust your eating plan to your individual calorie  needs. This information is not intended to replace advice given to you by your health care provider. Make sure you discuss any questions you have with your health care provider. Document Released: 03/19/2011 Document Revised: 03/12/2017 Document Reviewed: 03/23/2016 Elsevier Patient Education  2020 [employer].

## 2022-05-05 NOTE — Assessment & Plan Note (Addendum)
Blood pressure is uncontrolled.  She isn't able to get much exercise due to fibromyalgia and arthritis pain.  Continue losartan/HCTZ.  Will add amlodipine 5 mg daily.  Remove the afternoon dose of hydralazine so she will just take it twice a day.  Considered adding spironolactone She was given an advance hypertension clinic book and asked to check her blood pressures twice daily.  Continue working to limit sodium intake and increase exercise as tolerated.  Check a TSH, catecholamines, and metanephrines due to labile hypertension.  She will track her BP twice daily and bring to follow up. She was given an advanced HTN clinic book as an Wellsite geologist.

## 2022-05-05 NOTE — Progress Notes (Signed)
[facility] Initial Assessment:    Date:  05/05/2022   ID:  [name], DOB Aug 10, 1935, MRN [id number]  PCP:  Megan Norlander, DO  Cardiologist:  Jenkins Rouge, MD  Nephrologist:  Referring MD: Megan Norlander, DO   CC: Hypertension  History of Present Illness:    [name] is a 87 y.o. female with a hx of hypertension, severe aortic stenosis s/p TAVR, aortic atherosclerosis, hyperlipidemia, here to establish care in the [facility]. She last saw Dr. Johnsie [name] 12/2021. Prior to that, losartan was increased and HCTZ was added, however her blood pressures remained elevated and she complained of extreme fatigue. She had been under a lot of stress and feeling depressed after her son committed suicide. She also struggles with pain from arthritis. Hydralazine was added 12/2021. She was referred to counseling and the [facility].  Today, she is accompanied by her daughter. She confirms struggling with labile blood pressures at home. It may be as high as 016 systolic, then much lower 5 minutes later. Usually she checks her BP when she feels worse and her readings are often erratic; she states it is difficult for her to confirm an average blood pressure. About 1 week ago she had an event where she was feeling ill, and began to develop muscle cramps, diaphoresis, and lightheadedness. Her blood pressure at the time was around 010 systolic. She has not had any recurring episodes. Sometimes she feels very fatigued while walking, preventing her from going further. Usually she wants to be more active. However, it is typically difficult for her to walk due to bilateral knee pain, fibromyalgia, and arthritis. She continues to drive and cook her own meals. She has lost some weight, which she attributes to eating smaller portions. In the mornings she drinks 1 cup of coffee. Occasionally she may have an iced tea. She no longer consumes alcohol. About 1-2 times a  month she may take a tylenol for pain. Additionally she endorses sleep apnea and snoring. About 3 months ago she was struggling with more severe PND. This occurs once in a while now, and she will focus on slow, deep breathing. She also complains of an intermittent nocturnal cough. She may take naps during the day. She denies any palpitations, chest pain, peripheral edema, lightheadedness, headaches, or syncope.  Previous antihypertensives:   Past Medical History:  Diagnosis Date   Chronic headaches    Fibromyalgia    GERD (gastroesophageal reflux disease)    Hemorrhoids    INTERNAL--  POST BANDING 02/ 2015   History of kidney stones    Hyperlipidemia    Hypertension    Moderate aortic stenosis    NSAID long-term use 05/30/2013   OSA (obstructive sleep apnea) CPAP INTOLERANT    Osteoporosis    S/P TAVR (transcatheter aortic valve replacement) 10/19/2017   20 mm Edwards Sapien 3 transcatheter heart valve placed via percutaneous right transfemoral approach    Severe aortic stenosis    Spinal stenosis, multilevel     Past Surgical History:  Procedure Laterality Date   ANTERIOR CERVICAL DECOMP/DISCECTOMY FUSION  11-11-1999   C5 - C6   APPENDECTOMY  AGE 28   CARDIAC CATHETERIZATION  2008  DR NISHAN   ESSENTIALLY NORMAL   CATARACT EXTRACTION Bilateral    CATARACT EXTRACTION W/ INTRAOCULAR LENS  IMPLANT, BILATERAL  2012   EYE SURGERY     FLEXIBLE SIGMOIDOSCOPY N/A 06/01/2013   Procedure: FLEXIBLE SIGMOIDOSCOPY;  Surgeon: Inda Castle,  MD;  Location: [facility];  Service: Endoscopy;  Laterality: N/A;  may need hemorrhoidal banding   HEMORRHOIDECTOMY WITH HEMORRHOID BANDING  08-07-2010   IR RADIOLOGY PERIPHERAL GUIDED IV START  06/27/2019   IR US GUIDE VASC ACCESS RIGHT  06/27/2019   LAPAROSCOPIC CHOLECYSTECTOMY  1990   RIGHT/LEFT HEART CATH AND CORONARY ANGIOGRAPHY N/A 09/29/2017   Procedure: RIGHT/LEFT HEART CATH AND CORONARY ANGIOGRAPHY;  Surgeon: Sherren Mocha, MD;  Location:  [facility];  Service: Cardiovascular;  Laterality: N/A;   SHOULDER ARTHROSCOPY WITH OPEN ROTATOR CUFF REPAIR AND DISTAL CLAVICLE ACROMINECTOMY Right 05/25/2012   Procedure: SHOULDER ARTHROSCOPY WITH OPEN ROTATOR CUFF REPAIR AND DISTAL CLAVICLE ACROMINECTOMY;  Surgeon: Magnus Sinning, MD;  Location: [place];  Service: Orthopedics;  Laterality: Right;  RIGHT SHOULDER ARTHROSCOPY WITH DERBRIDEMENTOF LABRAL/BICEP TENDON, OPEN DISTAL CLAVICLE RESECTION, ANTERIOR ACROMINECTOMY ROTATOR CUFF REPAIR ANESTHESIA: GENERAL/SCALENE NERVE BLOCK   TEE WITHOUT CARDIOVERSION Bilateral 10/19/2017   Procedure: TRANSESOPHAGEAL ECHOCARDIOGRAM (TEE);  Surgeon: Sherren Mocha, MD;  Location: [place];  Service: Open Heart Surgery;  Laterality: Bilateral;   TENOSYNOVECTOMY Left 01/04/2014   Procedure: LEFT WRIST EXTENSOR TENOSYNOVECTOMY;  Surgeon: Linna Hoff, MD;  Location: [facility];  Service: Orthopedics;  Laterality: Left;   THYROIDECTOMY  AGE 8   GOITER   TONSILLECTOMY AND ADENOIDECTOMY  AGE 43   TRANSCATHETER AORTIC VALVE REPLACEMENT, TRANSFEMORAL  10/19/2017   TRANSCATHETER AORTIC VALVE REPLACEMENT, TRANSFEMORAL Bilateral 10/19/2017   Procedure: TRANSCATHETER AORTIC VALVE REPLACEMENT, TRANSFEMORAL;  Surgeon: Sherren Mocha, MD;  Location: [place];  Service: Open Heart Surgery;  Laterality: Bilateral;   TRANSTHORACIC ECHOCARDIOGRAM  last one 04-20-2013  DR [facility]    NORMAL LVSF/ EF 60-65%/ MODERATE  AV  STENOSIS WITH NO AR /  MILD LAE   UMBILICAL HERNIA REPAIR  JUNE 2006   VAGINAL HYSTERECTOMY  01-31-2001   ANTERIOR & POSTERIOR REPAIR/ TRANSVAGINAL BLADDER SLING    Current Medications: Current Meds  Medication Sig   acetaminophen (TYLENOL) 500 MG tablet Take 1,000 mg by mouth daily as needed for moderate pain or headache.   ALOE VERA PO Take 1 capsule by mouth daily.   amLODipine (NORVASC) 5 MG tablet Take 1 tablet (5 mg total) by mouth daily.   Ascorbic Acid  (VITAMIN C) 1000 MG tablet Take 1,000 mg by mouth daily.   aspirin EC 81 MG tablet Take 1 tablet (81 mg total) by mouth daily.   BEE POLLEN PO Take 400 mg by mouth daily.   clobetasol cream (TEMOVATE) 5.40 % APPLY 1 APPLICATION TOPICALLY 2 (TWO) TIMES DAILY AS NEEDED.   Coenzyme Q10 (COQ-10) 100 MG CAPS Take 100 mg by mouth daily.   furosemide (LASIX) 20 MG tablet Take 20 mg by mouth as needed.   Glucosamine-Chondroit-Vit C-Mn (GLUCOSAMINE 1500 COMPLEX PO) Take by mouth.   losartan-hydrochlorothiazide (HYZAAR) 100-25 MG tablet Take 1 tablet by mouth daily. CHANGE in therapy   Magnesium 500 MG CAPS Take by mouth.   metoprolol tartrate (LOPRESSOR) 50 MG tablet Take 50 mg by mouth as needed.   Misc Natural Products (TART CHERRY ADVANCED PO) Take by mouth daily.   mupirocin ointment (BACTROBAN) 2 % Apply to the affected area twice daily for 7-10 days   OVER THE COUNTER MEDICATION Take 1 capsule by mouth 2 (two) times daily. Hemp Oil   rosuvastatin (CRESTOR) 5 MG tablet TAKE 1 TABLET (5 MG TOTAL) BY MOUTH DAILY.   spironolactone (ALDACTONE) 25 MG tablet Take 1 tablet (25 mg total)  by mouth daily.   UNABLE TO FIND Med Name: HEMP- Tumeric cream / RUB   [DISCONTINUED] hydrALAZINE (APRESOLINE) 25 MG tablet Take 1 tablet (25 mg total) by mouth 3 (three) times daily.     Allergies:   Tape and Prolia [denosumab]   Social History   Socioeconomic History   Marital status: Married    Spouse name: Laylaa Guevarra   Number of children: 6   Years of education: 6   Highest education level: 6th grade  Occupational History   Occupation: Retired  Tobacco Use   Smoking status: Former    Packs/day: 0.25    Years: 5.00    Total pack years: 1.25    Types: Cigarettes    Quit date: 04/14/2003    Years since quitting: 19.0    Passive exposure: Past   Smokeless tobacco: Never  Vaping Use   Vaping Use: Never used  Substance and Sexual Activity   Alcohol use: Yes    Comment: once in a while-social   Drug  use: Never    Comment: hemp oil    Sexual activity: Not Currently    Partners: Male  Other Topics Concern   Not on file  Social History Narrative   Not on file   Social Determinants of Health   Financial Resource Strain: Low Risk  (11/28/2021)   Overall Financial Resource Strain (CARDIA)    Difficulty of Paying Living Expenses: Not hard at all  Food Insecurity: No Food Insecurity (11/28/2021)   Hunger Vital Sign    Worried About Running Out of Food in the Last Year: Never true    [place] in the Last Year: Never true  Transportation Needs: No Transportation Needs (11/28/2021)   PRAPARE - Hydrologist (Medical): No    Lack of Transportation (Non-Medical): No  Physical Activity: Inactive (11/28/2021)   Exercise Vital Sign    Days of Exercise per Week: 0 days    Minutes of Exercise per Session: 0 min  Stress: No Stress Concern Present (11/28/2021)   [place]    Feeling of Stress : Only a little  Social Connections: Socially Integrated (11/28/2021)   Social Connection and Isolation Panel [NHANES]    Frequency of Communication with Friends and Family: More than three times a week    Frequency of Social Gatherings with Friends and Family: More than three times a week    Attends Religious Services: More than 4 times per year    Active Member of [employer] or Organizations: Yes    Attends Music therapist: More than 4 times per year    Marital Status: Married     Family History: The patient's family history includes Cancer (age of onset: 64) in her son; Cirrhosis in her mother; Gallbladder disease in her maternal grandmother; Heart disease in her sister; Heart failure in her maternal grandmother; Other in her brother; Stomach cancer in her paternal grandfather.  ROS:   Please see the history of present illness.    (+) Fatigue (+) Bilateral knee pain (+) Myalgias (+)  Arthralgias (+) Shortness of breath (+) Orthopnea (+) Cough (+) Constant tinnitus All other systems reviewed and are negative.  EKGs/Labs/Other Studies Reviewed:    Echo 12/24/2021:  1. Left ventricular ejection fraction, by estimation, is 65 to 70%. The  left ventricle has normal function. The left ventricle has no regional  wall motion abnormalities. Left ventricular diastolic parameters are  consistent with Grade I diastolic  dysfunction (impaired relaxation). Elevated left atrial pressure.   2. Right ventricular systolic function is normal. The right ventricular  size is normal. There is normal pulmonary artery systolic pressure. The  estimated right ventricular systolic pressure is 37.8 mmHg.   3. Left atrial size was mild to moderately dilated.   4. The mitral valve is degenerative. Mild mitral valve regurgitation. No  evidence of mitral stenosis. Moderate to severe mitral annular  calcification.   5. There is probably moderate perivalvular leak at the anteromedial  aspect of the TAVR prosthesis (10 o'clock). The aortic valve has been  repaired/replaced. Aortic valve regurgitation is moderate. There is a 20  mm Sapien prosthetic (TAVR) valve present  in the aortic position. Procedure Date: 10/19/2017. Echo findings are  consistent with perivalvular leak of the aortic prosthesis. Aortic valve  mean gradient measures 13.0 mmHg. Aortic valve Vmax measures 2.67 m/s.   Comparison(s): No significant change from prior study. Prior images  reviewed side by side.   Cardiac TAVR CT  06/27/2019: IMPRESSION: 1. A 20 mm Edwards-SAPIEN TAVR valve is seen in the aortic position. Left coronary leaflet has hypoattenuated leaflet thickening (HALT) but otherwise full range of motion. All leaflets open well and don't have any evidence for a thrombus. There is a small gap outside of the bioprosthetic valve in between the right and non-coronary cusp that can be a culprit for the paravalvular  leak.   2. No thrombus in the left atrial appendage.   3. Normal size of the thoracic aorta with no dissection, mild diffuse atherosclerotic plaque and calcifications.   EKG:  EKG is personally reviewed. 05/05/2022: Sinus rhythm. Rate 65 bpm.  Recent Labs: 05/08/2021: ALT 19; Hemoglobin 12.0; Platelets 208 01/07/2022: BUN 20; Creatinine, Ser 1.11; Potassium 5.2; Sodium 136   Recent Lipid Panel    Component Value Date/Time   CHOL 145 05/25/2017 0844   CHOL 219 (H) 09/07/2012 0935   TRIG 136 05/25/2017 0844   TRIG 180 (H) 02/17/2013 1007   TRIG 326 (H) 09/07/2012 0935   HDL 47 05/25/2017 0844   HDL 51 02/17/2013 1007   HDL 47 09/07/2012 0935   CHOLHDL 3.1 05/25/2017 0844   CHOLHDL 5.2 10/09/2006 0435   VLDL 58 (H) 10/09/2006 0435   LDLCALC 71 05/25/2017 0844   LDLCALC 89 02/17/2013 1007   LDLCALC 107 (H) 09/07/2012 0935    Physical Exam:    VS:  BP (!) 138/52 (BP Location: Left Arm, Patient Position: Sitting, Cuff Size: Normal)   Pulse 65   Ht '5\' 2"'$  (1.575 m)   Wt 153 lb 3.2 oz (69.5 kg)   BMI 28.02 kg/m  , BMI Body mass index is 28.02 kg/m. GENERAL:  Well appearing HEENT: Pupils equal round and reactive, fundi not visualized, oral mucosa unremarkable NECK:  No jugular venous distention, waveform within normal limits, carotid upstroke brisk and symmetric, no bruits, no thyromegaly LUNGS:  Clear to auscultation bilaterally HEART:  RRR.  PMI not displaced or sustained,S1 and S2 within normal limits, no S3, no S4, no clicks, no rubs, III/VI systolic murmur at the LUSB ABD:  Flat, positive bowel sounds normal in frequency in pitch, no bruits, no rebound, no guarding, no midline pulsatile mass, no hepatomegaly, no splenomegaly EXT:  2 plus pulses throughout, no edema, no cyanosis, no clubbing SKIN:  No rashes, no nodules NEURO:  Cranial nerves II through XII grossly intact, motor grossly intact throughout PSYCH:  Cognitively intact, oriented to person  place and  time   ASSESSMENT/PLAN:    Essential hypertension Blood pressure is uncontrolled.  She isn't able to get much exercise due to fibromyalgia and arthritis pain.  Continue losartan/HCTZ.  Will add amlodipine 5 mg daily.  Remove the afternoon dose of hydralazine so she will just take it twice a day.  Considered adding spironolactone She was given an advance hypertension clinic book and asked to check her blood pressures twice daily.  Continue working to limit sodium intake and increase exercise as tolerated.  Check a TSH, catecholamines, and metanephrines due to labile hypertension.  She will track her BP twice daily and bring to follow up. She was given an advanced HTN clinic book as an Wellsite geologist.   Atherosclerosis of abdominal aorta (HCC) LDL goal is less than 70.  She will come back for fasting lipids and a CMP.  Continue rosuvastatin.  Continue aspirin.  S/P TAVR (transcatheter aortic valve replacement) Moderate aortic regurgitation post TAVR.  Mean TAVR gradient is 13 mmHg and stable.  This does not explain her dyspnea.  Check a CBC.  Palpitations She takes metoprolol as needed.    Screening for Secondary Hypertension:     05/05/2022    2:19 PM  Causes  Drugs/Herbals Screened     - Comments One coffee per day.  No NSAIDS.  No EtOH  Renovascular HTN N/A  Sleep Apnea Screened     - Comments Snores.  Daytime somnolence.  Thyroid Disease Screened     - Comments check TSH  Hyperaldosteronism N/A  Pheochromocytoma Screened     - Comments Check catecholamines and metanephrines given labile HTN  Cushing's Syndrome N/A  Hyperparathyroidism Screened  Coarctation of the Aorta Screened     - Comments BP symmetric  Compliance Screened    Relevant Labs/Studies:    Latest Ref Rng & Units 01/07/2022    2:12 PM 12/16/2021    2:56 PM 05/08/2021    7:57 PM  Basic Labs  Sodium 134 - 144 mmol/L 136  141  139   Potassium 3.5 - 5.2 mmol/L 5.2  5.4  4.7   Creatinine 0.57 - 1.00 mg/dL  1.11  1.00  0.82        Latest Ref Rng & Units 02/12/2020    4:22 PM 09/07/2019   11:00 AM  Thyroid   TSH 0.450 - 4.500 uIU/mL 2.840  2.730     Disposition:    FU with APP/PharmD in 1 month for the next 3 months.   FU with Bobette Leyh C. Oval Linsey, MD, [facility] in 4 months.   Medication Adjustments/Labs and Tests Ordered: Current medicines are reviewed at length with the patient today.  Concerns regarding medicines are outlined above.   Orders Placed This Encounter  Procedures   CBC with Differential/Platelet   Metanephrines, plasma   Catecholamines, fractionated, plasma   Lipid panel   Comprehensive metabolic panel   TSH   EKG 12-Lead   Meds ordered this encounter  Medications   spironolactone (ALDACTONE) 25 MG tablet    Sig: Take 1 tablet (25 mg total) by mouth daily.    Dispense:  90 tablet    Refill:  3   amLODipine (NORVASC) 5 MG tablet    Sig: Take 1 tablet (5 mg total) by mouth daily.    Dispense:  90 tablet    Refill:  3    D/C SPIRONOLACTONE RX   hydrALAZINE (APRESOLINE) 25 MG tablet    Sig: Take 1 tablet (25 mg total)  by mouth in the morning and at bedtime.    Dispense:  270 tablet    Refill:  3    NEW DOSE, D/C TID RX   I,Mathew Stumpf,acting as a scribe for Skeet Latch, MD.,have documented all relevant documentation on the behalf of Skeet Latch, MD,as directed by  Skeet Latch, MD while in the presence of Skeet Latch, MD.  I, [place] Oval Linsey, MD have reviewed all documentation for this visit.  The documentation of the exam, diagnosis, procedures, and orders on 05/05/2022 are all accurate and complete.   Signed, Skeet Latch, MD  05/05/2022 2:49 PM    [place]

## 2022-05-05 NOTE — Assessment & Plan Note (Signed)
She takes metoprolol as needed.

## 2022-05-05 NOTE — Assessment & Plan Note (Signed)
Moderate aortic regurgitation post TAVR.  Mean TAVR gradient is 13 mmHg and stable.  This does not explain her dyspnea.  Check a CBC.

## 2022-05-18 DIAGNOSIS — E7800 Pure hypercholesterolemia, unspecified: Secondary | ICD-10-CM | POA: Diagnosis not present

## 2022-05-18 DIAGNOSIS — I10 Essential (primary) hypertension: Secondary | ICD-10-CM | POA: Diagnosis not present

## 2022-05-18 DIAGNOSIS — R002 Palpitations: Secondary | ICD-10-CM | POA: Diagnosis not present

## 2022-05-18 DIAGNOSIS — R0602 Shortness of breath: Secondary | ICD-10-CM | POA: Diagnosis not present

## 2022-05-22 DIAGNOSIS — H2513 Age-related nuclear cataract, bilateral: Secondary | ICD-10-CM | POA: Diagnosis not present

## 2022-05-22 DIAGNOSIS — H40033 Anatomical narrow angle, bilateral: Secondary | ICD-10-CM | POA: Diagnosis not present

## 2022-06-01 ENCOUNTER — Other Ambulatory Visit: Payer: Self-pay | Admitting: Family

## 2022-06-01 DIAGNOSIS — W57XXXA Bitten or stung by nonvenomous insect and other nonvenomous arthropods, initial encounter: Secondary | ICD-10-CM

## 2022-06-01 LAB — CBC WITH DIFFERENTIAL/PLATELET
Basophils Absolute: 0.1 10*3/uL (ref 0.0–0.2)
Basos: 1 %
EOS (ABSOLUTE): 0.3 10*3/uL (ref 0.0–0.4)
Eos: 6 %
Hematocrit: 33.2 % — ABNORMAL LOW (ref 34.0–46.6)
Hemoglobin: 11.5 g/dL (ref 11.1–15.9)
Immature Grans (Abs): 0 10*3/uL (ref 0.0–0.1)
Immature Granulocytes: 0 %
Lymphocytes Absolute: 1.7 10*3/uL (ref 0.7–3.1)
Lymphs: 33 %
MCH: 30.8 pg (ref 26.6–33.0)
MCHC: 34.6 g/dL (ref 31.5–35.7)
MCV: 89 fL (ref 79–97)
Monocytes Absolute: 0.5 10*3/uL (ref 0.1–0.9)
Monocytes: 9 %
Neutrophils Absolute: 2.7 10*3/uL (ref 1.4–7.0)
Neutrophils: 51 %
Platelets: 222 10*3/uL (ref 150–450)
RBC: 3.73 x10E6/uL — ABNORMAL LOW (ref 3.77–5.28)
RDW: 12.1 % (ref 11.7–15.4)
WBC: 5.3 10*3/uL (ref 3.4–10.8)

## 2022-06-01 LAB — COMPREHENSIVE METABOLIC PANEL
ALT: 21 IU/L (ref 0–32)
AST: 26 IU/L (ref 0–40)
Albumin/Globulin Ratio: 1.7 (ref 1.2–2.2)
Albumin: 4 g/dL (ref 3.7–4.7)
Alkaline Phosphatase: 62 IU/L (ref 44–121)
BUN/Creatinine Ratio: 12 (ref 12–28)
BUN: 11 mg/dL (ref 8–27)
Bilirubin Total: 0.3 mg/dL (ref 0.0–1.2)
CO2: 25 mmol/L (ref 20–29)
Calcium: 9.2 mg/dL (ref 8.7–10.3)
Chloride: 104 mmol/L (ref 96–106)
Creatinine, Ser: 0.91 mg/dL (ref 0.57–1.00)
Globulin, Total: 2.3 g/dL (ref 1.5–4.5)
Glucose: 96 mg/dL (ref 70–99)
Potassium: 5 mmol/L (ref 3.5–5.2)
Sodium: 140 mmol/L (ref 134–144)
Total Protein: 6.3 g/dL (ref 6.0–8.5)
eGFR: 62 mL/min/{1.73_m2} (ref >59)

## 2022-06-01 LAB — LIPID PANEL
Chol/HDL Ratio: 3.8 ratio (ref 0.0–4.4)
Cholesterol, Total: 229 mg/dL — ABNORMAL HIGH (ref 100–199)
HDL: 61 mg/dL (ref >39)
LDL Chol Calc (NIH): 146 mg/dL — ABNORMAL HIGH (ref 0–99)
Triglycerides: 126 mg/dL (ref 0–149)
VLDL Cholesterol Cal: 22 mg/dL (ref 5–40)

## 2022-06-01 LAB — TSH: TSH: 3.46 u[IU]/mL (ref 0.450–4.500)

## 2022-06-01 LAB — METANEPHRINES, PLASMA
Metanephrine, Free: <25 pg/mL (ref 0.0–88.0)
Normetanephrine, Free: 139.9 pg/mL (ref 0.0–297.2)

## 2022-06-01 LAB — CATECHOLAMINES, FRACTIONATED, PLASMA
Dopamine: <30 pg/mL (ref 0–48)
Epinephrine: 36 pg/mL (ref 0–62)
Norepinephrine: 1062 pg/mL — ABNORMAL HIGH (ref 0–874)

## 2022-06-04 ENCOUNTER — Encounter (HOSPITAL_BASED_OUTPATIENT_CLINIC_OR_DEPARTMENT_OTHER): Payer: Self-pay | Admitting: Pharmacist Clinician (PhC)/ Clinical Pharmacy Specialist

## 2022-06-04 ENCOUNTER — Ambulatory Visit (HOSPITAL_BASED_OUTPATIENT_CLINIC_OR_DEPARTMENT_OTHER): Payer: Medicare Other | Admitting: Pharmacist Clinician (PhC)/ Clinical Pharmacy Specialist

## 2022-06-04 VITALS — BP 150/73 | HR 84 | Ht 62.0 in | Wt 150.7 lb

## 2022-06-04 DIAGNOSIS — I10 Essential (primary) hypertension: Secondary | ICD-10-CM | POA: Diagnosis not present

## 2022-06-04 MED ORDER — OLMESARTAN MEDOXOMIL-HCTZ 20-12.5 MG PO TABS: 1.0000 | ORAL_TABLET | Freq: Every day | ORAL | 6 refills | Status: DC

## 2022-06-04 NOTE — Patient Instructions (Signed)
Follow up appointment: April 25 at 10 am  Go to the lab in 2 weeks to check potassium  Take your BP meds as follows:  Olmesartan hctz 20/12.5 mg once daily  Amlodipine 5 mg once daily  Hydralazine 25 mg twice daily  Check your blood pressure at home daily (if able) and keep record of the readings.  Hypertension "High blood pressure"  Hypertension is often called "The Silent Killer." It rarely causes symptoms until it is extremely  high or has done damage to other organs in the body. For this reason, you should have your  blood pressure checked regularly by your physician. We will check your blood pressure  every time you see a provider at one of our offices.   Your blood pressure reading consists of two numbers. Ideally, blood pressure should be  below 120/80. The first ("top") number is called the systolic pressure. It measures the  pressure in your arteries as your heart beats. The second ("bottom") number is called the diastolic pressure. It measures the pressure in your arteries as the heart relaxes between beats.  The benefits of getting your blood pressure under control are enormous. A 10-point  reduction in systolic blood pressure can reduce your risk of stroke by 27% and heart failure by 28%  Your blood pressure goal is < 130/80  To check your pressure at home you will need to:  1. Sit up in a chair, with feet flat on the floor and back supported. Do not cross your ankles or legs. 2. Rest your left arm so that the cuff is about heart level. If the cuff goes on your upper arm,  then just relax the arm on the table, arm of the chair or your lap. If you have a wrist cuff, we  suggest relaxing your wrist against your chest (think of it as Pledging the Flag with the  wrong arm).  3. Place the cuff snugly around your arm, about 1 inch above the crook of your elbow. The  cords should be inside the groove of your elbow.  4. Sit quietly, with the cuff in place, for about 5  minutes. After that 5 minutes press the power  button to start a reading. 5. Do not talk or move while the reading is taking place.  6. Record your readings on a sheet of paper. Although most cuffs have a memory, it is often  easier to see a pattern developing when the numbers are all in front of you.  7. You can repeat the reading after 1-3 minutes if it is recommended  Make sure your bladder is empty and you have not had caffeine or tobacco within the last 30 min  Always bring your blood pressure log with you to your appointments. If you have not brought your monitor in to be double checked for accuracy, please bring it to your next appointment.  You can find a list of quality blood pressure cuffs at [email or web address]

## 2022-06-04 NOTE — Progress Notes (Signed)
Office Visit    Patient Name: [name] Date of Encounter: 06/04/2022  Primary Care Provider:  [name], DO Primary Cardiologist:  Jenkins Rouge, MD  Chief Complaint    Hypertension - Advanced hypertension clinic  Past Medical History   Hyperlipidemia 2/24 LDL 146; on rosuvastatin 5 mg, abdominal aortic atherosclerosis noted, goal < 70  Aortic stenosis TAVR 10/2017  OSA CPAP intolerant  palpitations Uses prn metoprolol (? She isn't sure if she has this)  fibromyalgia     Allergies  Allergen Reactions   Tape Other (See Comments)    Dermatitis rash "with extended exposure"   Prolia [Denosumab] Other (See Comments)    Arthralgia/ myalgia/ jaw pain/ headache    History of Present Illness    [name] is a 87 y.o. female patient who was referred to the [facility] by Dr. Lajuana Ripple.  Patient reported having labile blood pressures at home, as high as [id number] systolic, but dropping rapidly just a few minutes later.  She admitted to being under a lot of stress after the death of her son (suicide) and chronic pain.  When she saw Dr. Oval Linsey last month her pressure was 138/52.  Amlodipine 5 mg daily was added to her regimen.  Today she is in the office with her daughter for follow up.  She complains of getting weak easily and will often take a nap or two during the day.  Notes that even talking makes her tired.  She brings a bag of home medications this morning, goes back and forth between this being all of her meds or just her heart meds.  Unfortunately the bag only contains amlodipine, hydralazine and aspirin.  Patient tells [place] that her husband (age 40) gets her meds out for her daily and she takes whatever he gives her.    Blood Pressure Goal:  130/80  Current Medications: amlodipine 5 mg qd,, hydralazine 25 mg bid,  Adherence Assessment  Do you ever forget to take your medication? '[]'$ Yes '[x]'$ No - but unknown what she is taking  Do you ever skip doses due  to side effects? '[]'$ Yes '[x]'$ No  Do you have trouble affording your medicines? '[]'$ Yes '[x]'$ No  Are you ever unable to pick up your medication due to transportation difficulties? '[]'$ Yes '[x]'$ No  Do you ever stop taking your medications because you don't believe they are helping? '[]'$ Yes '[x]'$ No   Adherence strategy: husband doles out meds  Social Hx:      Tobacco:no  Alcohol: only margarita aobut twcie per year  Caffeine:  coffee in the morning, no soda or tea;   Diet:    eats breakfast and dinner; often chicken or beef for dinner, had sandwich last night; breakfast is cereal (Muesli) or eggs; did cut back on cookies after breakfast; no chips or crackers, no sweet tooth  Exercise: limited 2/2 fibromyalgia, arthritis   Home BP readings:  29 readings average 169/99 HR 92  (range    Second reading00150/73   Accessory Clinical Findings    Lab Results  Component Value Date   CREATININE 0.91 05/18/2022   BUN 11 05/18/2022   NA 140 05/18/2022   K 5.0 05/18/2022   CL 104 05/18/2022   CO2 25 05/18/2022   Lab Results  Component Value Date   ALT 21 05/18/2022   AST 26 05/18/2022   ALKPHOS 62 05/18/2022   BILITOT 0.3 05/18/2022   Lab Results  Component Value Date   HGBA1C 5.7 (H) 10/15/2017  Screening for Secondary Hypertension:      05/05/2022    2:19 PM  Causes  Drugs/Herbals Screened     - Comments One coffee per day.  No NSAIDS.  No EtOH  Renovascular HTN N/A  Sleep Apnea Screened     - Comments Snores.  Daytime somnolence.  Thyroid Disease Screened     - Comments check TSH  Hyperaldosteronism N/A  Pheochromocytoma Screened     - Comments Check catecholamines and metanephrines given labile HTN  Cushing's Syndrome N/A  Hyperparathyroidism Screened  Coarctation of the Aorta Screened     - Comments BP symmetric  Compliance Screened    Relevant Labs/Studies:    Latest Ref Rng & Units 05/18/2022    9:55 AM 01/07/2022    2:12 PM 12/16/2021    2:56 PM  Basic Labs  Sodium  134 - 144 mmol/L 140  136  141   Potassium 3.5 - 5.2 mmol/L 5.0  5.2  5.4   Creatinine 0.57 - 1.00 mg/dL 0.91  1.11  1.00        Latest Ref Rng & Units 05/18/2022    9:55 AM 02/12/2020    4:22 PM  Thyroid   TSH 0.450 - 4.500 uIU/mL 3.460  2.840           Latest Ref Rng & Units 05/18/2022    9:55 AM  Metanephrines/Catecholamines   Epinephrine 0 - 62 pg/mL 36   Norepinephrine 0 - 874 pg/mL 1,062   Dopamine 0 - 48 pg/mL <30   Metanephrines 0.0 - 88.0 pg/mL <25.0   Normetanephrines  0.0 - 297.2 pg/mL 139.9             Home Medications    Current Outpatient Medications  Medication Sig Dispense Refill   amLODipine (NORVASC) 5 MG tablet Take 1 tablet (5 mg total) by mouth daily. 90 tablet 3   aspirin EC 81 MG tablet Take 1 tablet (81 mg total) by mouth daily. 90 tablet 3   hydrALAZINE (APRESOLINE) 25 MG tablet Take 1 tablet (25 mg total) by mouth in the morning and at bedtime. 270 tablet 3   olmesartan-hydrochlorothiazide (BENICAR HCT) 20-12.5 MG tablet Take 1 tablet by mouth daily. 30 tablet 6   acetaminophen (TYLENOL) 500 MG tablet Take 1,000 mg by mouth daily as needed for moderate pain or headache.     ALOE VERA PO Take 1 capsule by mouth daily.     Ascorbic Acid (VITAMIN C) 1000 MG tablet Take 1,000 mg by mouth daily.     BEE POLLEN PO Take 400 mg by mouth daily.     clobetasol cream (TEMOVATE) [id number] % APPLY 1 APPLICATION TOPICALLY 2 (TWO) TIMES DAILY AS NEEDED. 30 g 3   Coenzyme Q10 (COQ-10) 100 MG CAPS Take 100 mg by mouth daily.     furosemide (LASIX) 20 MG tablet Take 20 mg by mouth as needed.     Glucosamine-Chondroit-Vit C-Mn (GLUCOSAMINE 1500 COMPLEX PO) Take by mouth.     Magnesium 500 MG CAPS Take by mouth.     metoprolol tartrate (LOPRESSOR) 50 MG tablet Take 50 mg by mouth as needed.     Misc Natural Products (TART CHERRY ADVANCED PO) Take by mouth daily.     mupirocin ointment (BACTROBAN) 2 % Apply to the affected area twice daily for 7-10 days 22 g 0   OVER THE  COUNTER MEDICATION Take 1 capsule by mouth 2 (two) times daily. Hemp Oil     rosuvastatin (CRESTOR)  5 MG tablet TAKE 1 TABLET (5 MG TOTAL) BY MOUTH DAILY. 90 tablet 3   UNABLE TO FIND Med Name: HEMP- Tumeric cream / RUB     No current facility-administered medications for this visit.     Assessment & Plan   HYPERTENSION CONTROL Vitals:   06/04/22 1022 06/04/22 1030  BP: (!) 171/61 (!) 150/73    The patient's blood pressure is elevated above target today.  In order to address the patient's elevated BP:       Essential hypertension Assessment: BP is uncontrolled in office BP 150/73 mmHg;  above the goal (<130/80). Brought home meds, but we are unclear as to whether this is all of them or just her "heart meds" Tolerates current medications well without any side effects Denies SOB, palpitation, chest pain, headaches,or swelling Reiterated the importance of regular exercise and low salt diet   Plan:  Stop taking losartan hctz (if you are) Start taking olmesartan hctz 20/12.5 mg once daily Continue taking amlodipine 5 mg daily, hydralazine 25 mg bid Be sure you have the metoprolol tart at home to use if HR becomes elevated Patient to keep record of BP readings with heart rate and report to [place] at the next visit Patient to follow up with PharmD in 2 months for follow up  Labs ordered today:  BMET - 2 weeks (pt has hx of high normal potassium)   Tommy Medal PharmD CPP [facility]  [address] [place], [place] [address] [phone]

## 2022-06-04 NOTE — Assessment & Plan Note (Signed)
Assessment: BP is uncontrolled in office BP 150/73 mmHg;  above the goal (<130/80). Brought home meds, but we are unclear as to whether this is all of them or just her "heart meds" Tolerates current medications well without any side effects Denies SOB, palpitation, chest pain, headaches,or swelling Reiterated the importance of regular exercise and low salt diet   Plan:  Stop taking losartan hctz (if you are) Start taking olmesartan hctz 20/12.5 mg once daily Continue taking amlodipine 5 mg daily, hydralazine 25 mg bid Be sure you have the metoprolol tart at home to use if HR becomes elevated Patient to keep record of BP readings with heart rate and report to [place] at the next visit Patient to follow up with PharmD in 2 months for follow up  Labs ordered today:  BMET - 2 weeks (pt has hx of high normal potassium)

## 2022-06-05 ENCOUNTER — Telehealth (HOSPITAL_BASED_OUTPATIENT_CLINIC_OR_DEPARTMENT_OTHER): Payer: Self-pay | Admitting: *Deleted

## 2022-06-05 DIAGNOSIS — I10 Essential (primary) hypertension: Secondary | ICD-10-CM

## 2022-06-05 DIAGNOSIS — Z5181 Encounter for therapeutic drug level monitoring: Secondary | ICD-10-CM

## 2022-06-05 DIAGNOSIS — E782 Mixed hyperlipidemia: Secondary | ICD-10-CM

## 2022-06-05 MED ORDER — ROSUVASTATIN CALCIUM 10 MG PO TABS: 10.0000 mg | ORAL_TABLET | Freq: Every day | ORAL | 3 refills | Status: DC

## 2022-06-05 NOTE — Telephone Encounter (Signed)
-----   Message from Skeet Latch, MD sent at 06/05/2022 10:11 AM EST ----- Normal thyroid function.  Norepinephrine is elevated.  Please collect 24 hour urine catecholamines on ice.  Cholesterol levels are too high.  Increase rosuvastatin to 76m.,  Repeat lipids/CMP in 2-3 months

## 2022-06-05 NOTE — Telephone Encounter (Signed)
Advised patient and sent mychart message

## 2022-06-11 DIAGNOSIS — I10 Essential (primary) hypertension: Secondary | ICD-10-CM | POA: Diagnosis not present

## 2022-06-18 LAB — CATECHOLAMINES, FRACTIONATED, URINE, 24 HOUR
Dopamine , 24H Ur: 130 ug/24 hr (ref 0–510)
Dopamine, Rand Ur: 96 ug/L
Epinephrine, 24H Ur: <4 ug/24 hr (ref 0–20)
Epinephrine, Rand Ur: <3 ug/L
Norepinephrine, 24H Ur: 49 ug/24 hr (ref 0–135)
Norepinephrine, Rand Ur: 36 ug/L

## 2022-06-27 ENCOUNTER — Other Ambulatory Visit: Payer: Self-pay | Admitting: Family Medicine

## 2022-06-27 DIAGNOSIS — Z952 Presence of prosthetic heart valve: Secondary | ICD-10-CM

## 2022-06-29 NOTE — Telephone Encounter (Signed)
Last OV 01/07/22. Last RF 12/02/21. Next OV no appt scheduled Patient was to follow up in 3 months (around 04/08/2022) for Blood pressure

## 2022-07-13 ENCOUNTER — Ambulatory Visit: Payer: Medicare Other | Admitting: Orthopedic Surgery

## 2022-07-13 DIAGNOSIS — M170 Bilateral primary osteoarthritis of knee: Secondary | ICD-10-CM | POA: Diagnosis not present

## 2022-07-13 DIAGNOSIS — M1711 Unilateral primary osteoarthritis, right knee: Secondary | ICD-10-CM

## 2022-07-13 DIAGNOSIS — M1712 Unilateral primary osteoarthritis, left knee: Secondary | ICD-10-CM | POA: Diagnosis not present

## 2022-07-13 MED ORDER — TRAMADOL HCL 50 MG PO TABS: 50.0000 mg | ORAL_TABLET | Freq: Two times a day (BID) | ORAL | 0 refills | Status: DC | PRN

## 2022-07-14 ENCOUNTER — Encounter: Payer: Self-pay | Admitting: Orthopedic Surgery

## 2022-07-14 NOTE — Progress Notes (Unsigned)
Office Visit Note   Patient: [name]           Date of Birth: [birth year]           MRN: [id number] Visit Date: 07/13/2022 Requested by: [name], DO [place],  [place] [address] PCP: [name], DO  Subjective: Chief Complaint  Patient presents with   Right Knee - Pain   Left Knee - Pain    HPI: [name] is a 87 y.o. female who presents to the office reporting bilateral knee pain.  She has a lot of pain at night.  She would like both knees injected today.  Right knee was injected last year and she did well with that.  Denies much in the way of back pain.  Denies any groin pain.  Prior radiographs do demonstrate significant arthritis more in the right knee than the left..                ROS: All systems reviewed are negative as they relate to the chief complaint within the history of present illness.  Patient denies fevers or chills.  Assessment & Plan: Visit Diagnoses:  1. Primary osteoarthritis of both knees     Plan: Impression is bilateral knee arthritis.  Plan is bilateral knee injections performed today and we will try Ultram for symptoms.  She will follow-up with [place] as needed.  She wants to avoid surgery if at all possible.  She did do well for a year with the right knee injected.  Hopefully that will be the case for these injections as well.  Follow-Up Instructions: No follow-ups on file.   Orders:  No orders of the defined types were placed in this encounter.  Meds ordered this encounter  Medications   traMADol (ULTRAM) 50 MG tablet    Sig: Take 1 tablet (50 mg total) by mouth every 12 (twelve) hours as needed.    Dispense:  35 tablet    Refill:  0      Procedures: Large Joint Inj: bilateral knee on 07/13/2022 7:38 PM Indications: diagnostic evaluation, joint swelling and pain Details: 18 G 1.5 in needle, superolateral approach  Arthrogram: No  Medications (Right): 5 mL lidocaine 1 %; 4 mL bupivacaine 0.25 %; 40 mg  methylPREDNISolone acetate 40 MG/ML Medications (Left): 5 mL lidocaine 1 %; 4 mL bupivacaine 0.25 %; 40 mg methylPREDNISolone acetate 40 MG/ML Outcome: tolerated well, no immediate complications Procedure, treatment alternatives, risks and benefits explained, specific risks discussed. Consent was given by the patient. Immediately prior to procedure a time out was called to verify the correct patient, procedure, equipment, support staff and site/side marked as required. Patient was prepped and draped in the usual sterile fashion.       Clinical Data: No additional findings.  Objective: Vital Signs: There were no vitals taken for this visit.  Physical Exam:  Constitutional: Patient appears well-developed HEENT:  Head: Normocephalic Eyes:EOM are normal Neck: Normal range of motion Cardiovascular: Normal rate Pulmonary/chest: Effort normal Neurologic: Patient is alert Skin: Skin is warm Psychiatric: Patient has normal mood and affect  Ortho Exam: Ortho exam demonstrates intact extensor mechanism bilaterally.  She has pretty reasonable range of motion from full extension to about 110 of flexion in both knees.  Collateral and cruciate ligaments are stable.  Pedal pulses palpable.  Ankle dorsiflexion intact.  No groin pain with internal or external rotation in either knee.  Has medial and lateral joint line tenderness  in both knees.  Negative patellar apprehension.  Moderate patellofemoral crepitus more on the right than the left.  Specialty Comments:  No specialty comments available.  Imaging: No results found.   PMFS History: Patient Active Problem List   Diagnosis Date Noted   Dysphagia 05/26/2021   Elevated lipase 05/26/2021   Hospital discharge follow-up 05/09/2021   Bilateral low back pain with sciatica 09/19/2019   Bilateral lower extremity edema 08/14/2019   S/P TAVR (transcatheter aortic valve replacement) 10/19/2017   Chronic fatigue 08/25/2017   DDD (degenerative  disc disease), cervical 07/08/2017   Bursitis of left shoulder 07/08/2017   Fibromyalgia 07/08/2017   Atherosclerosis of abdominal aorta 06/29/2017   Mixed hyperlipidemia 03/19/2017   Osteoporosis 04/21/2016   Internal hemorrhoids with other complication [id number]   GI bleed 05/30/2013   NSAID long-term use 05/30/2013   Severe aortic stenosis 05/29/2013   DIVERTICULOSIS-COLON 01/13/2010   CONSTIPATION 01/13/2010   PERSONAL HISTORY OF COLONIC POLYPS 01/13/2010   Essential hypertension 08/28/2008   GERD 08/28/2008   Palpitations 08/28/2008   Past Medical History:  Diagnosis Date   Chronic headaches    Fibromyalgia    GERD (gastroesophageal reflux disease)    Hemorrhoids    INTERNAL--  POST BANDING 02/ 2015   History of kidney stones    Hyperlipidemia    Hypertension    Moderate aortic stenosis    NSAID long-term use 05/30/2013   OSA (obstructive sleep apnea) CPAP INTOLERANT    Osteoporosis    S/P TAVR (transcatheter aortic valve replacement) 10/19/2017   20 mm Edwards Sapien 3 transcatheter heart valve placed via percutaneous right transfemoral approach    Severe aortic stenosis    Spinal stenosis, multilevel     Family History  Problem Relation Age of Onset   Cirrhosis Mother        drinking   Heart disease Sister    Other Brother        duodenal ulcer   Heart failure Maternal Grandmother    Gallbladder disease Maternal Grandmother    Stomach cancer Paternal Grandfather    Cancer Son 41       colon    Past Surgical History:  Procedure Laterality Date   ANTERIOR CERVICAL DECOMP/DISCECTOMY FUSION  11-11-1999   C5 - C6   APPENDECTOMY  AGE 42   CARDIAC CATHETERIZATION  2008  DR NISHAN   ESSENTIALLY NORMAL   CATARACT EXTRACTION Bilateral    CATARACT EXTRACTION W/ INTRAOCULAR LENS  IMPLANT, BILATERAL  2012   EYE SURGERY     FLEXIBLE SIGMOIDOSCOPY N/A 06/01/2013   Procedure: FLEXIBLE SIGMOIDOSCOPY;  Surgeon: Inda Castle, MD;  Location: [facility];  Service:  Endoscopy;  Laterality: N/A;  may need hemorrhoidal banding   HEMORRHOIDECTOMY WITH HEMORRHOID BANDING  08-07-2010   IR RADIOLOGY PERIPHERAL GUIDED IV START  06/27/2019   IR US GUIDE VASC ACCESS RIGHT  06/27/2019   LAPAROSCOPIC CHOLECYSTECTOMY  1990   RIGHT/LEFT HEART CATH AND CORONARY ANGIOGRAPHY N/A 09/29/2017   Procedure: RIGHT/LEFT HEART CATH AND CORONARY ANGIOGRAPHY;  Surgeon: Sherren Mocha, MD;  Location: [facility];  Service: Cardiovascular;  Laterality: N/A;   SHOULDER ARTHROSCOPY WITH OPEN ROTATOR CUFF REPAIR AND DISTAL CLAVICLE ACROMINECTOMY Right 05/25/2012   Procedure: SHOULDER ARTHROSCOPY WITH OPEN ROTATOR CUFF REPAIR AND DISTAL CLAVICLE ACROMINECTOMY;  Surgeon: Magnus Sinning, MD;  Location: [facility];  Service: Orthopedics;  Laterality: Right;  RIGHT SHOULDER ARTHROSCOPY WITH DERBRIDEMENTOF LABRAL/BICEP TENDON, OPEN DISTAL CLAVICLE RESECTION, ANTERIOR ACROMINECTOMY ROTATOR CUFF  REPAIR ANESTHESIA: GENERAL/SCALENE NERVE BLOCK   TEE WITHOUT CARDIOVERSION Bilateral 10/19/2017   Procedure: TRANSESOPHAGEAL ECHOCARDIOGRAM (TEE);  Surgeon: Sherren Mocha, MD;  Location: [facility];  Service: Open Heart Surgery;  Laterality: Bilateral;   TENOSYNOVECTOMY Left 01/04/2014   Procedure: LEFT WRIST EXTENSOR TENOSYNOVECTOMY;  Surgeon: Linna Hoff, MD;  Location: [facility];  Service: Orthopedics;  Laterality: Left;   THYROIDECTOMY  AGE 85   GOITER   TONSILLECTOMY AND ADENOIDECTOMY  AGE 57   TRANSCATHETER AORTIC VALVE REPLACEMENT, TRANSFEMORAL  10/19/2017   TRANSCATHETER AORTIC VALVE REPLACEMENT, TRANSFEMORAL Bilateral 10/19/2017   Procedure: TRANSCATHETER AORTIC VALVE REPLACEMENT, TRANSFEMORAL;  Surgeon: Sherren Mocha, MD;  Location: [facility];  Service: Open Heart Surgery;  Laterality: Bilateral;   TRANSTHORACIC ECHOCARDIOGRAM  last one 04-20-2013  DR [facility]    NORMAL LVSF/ EF 60-65%/ MODERATE  AV  STENOSIS WITH NO AR /  MILD LAE   UMBILICAL HERNIA REPAIR  JUNE  2006   VAGINAL HYSTERECTOMY  01-31-2001   ANTERIOR & POSTERIOR REPAIR/ TRANSVAGINAL BLADDER SLING   Social History   Occupational History   Occupation: Retired  Tobacco Use   Smoking status: Former    Packs/day: 0.25    Years: 5.00    Additional pack years: 0.00    Total pack years: 1.25    Types: Cigarettes    Quit date: 04/14/2003    Years since quitting: 19.2    Passive exposure: Past   Smokeless tobacco: Never  Vaping Use   Vaping Use: Never used  Substance and Sexual Activity   Alcohol use: Yes    Comment: once in a while-social   Drug use: Never    Comment: hemp oil    Sexual activity: Not Currently    Partners: Male

## 2022-07-15 MED ORDER — LIDOCAINE HCL 1 % IJ SOLN
5.0000 mL | INTRAMUSCULAR | Status: AC | PRN
  Administered 2022-07-13: 5 mL

## 2022-07-15 MED ORDER — LIDOCAINE HCL 1 % IJ SOLN
5.0000 mL | INTRAMUSCULAR | Status: AC | PRN
Start: 2022-07-13 — End: 2022-07-13
  Administered 2022-07-13: 5 mL

## 2022-07-15 MED ORDER — BUPIVACAINE HCL 0.25 % IJ SOLN
4.0000 mL | INTRAMUSCULAR | Status: AC | PRN
  Administered 2022-07-13: 4 mL via INTRA_ARTICULAR

## 2022-07-15 MED ORDER — METHYLPREDNISOLONE ACETATE 40 MG/ML IJ SUSP
40.0000 mg | INTRAMUSCULAR | Status: AC | PRN
Start: 2022-07-13 — End: 2022-07-13
  Administered 2022-07-13: 40 mg via INTRA_ARTICULAR

## 2022-07-20 ENCOUNTER — Other Ambulatory Visit: Payer: Self-pay | Admitting: Family Medicine

## 2022-07-20 DIAGNOSIS — L989 Disorder of the skin and subcutaneous tissue, unspecified: Secondary | ICD-10-CM

## 2022-08-06 ENCOUNTER — Ambulatory Visit (HOSPITAL_BASED_OUTPATIENT_CLINIC_OR_DEPARTMENT_OTHER): Payer: Medicare Other | Admitting: Pharmacist Clinician (PhC)/ Clinical Pharmacy Specialist

## 2022-08-06 ENCOUNTER — Encounter (HOSPITAL_BASED_OUTPATIENT_CLINIC_OR_DEPARTMENT_OTHER): Payer: Self-pay | Admitting: Pharmacist Clinician (PhC)/ Clinical Pharmacy Specialist

## 2022-08-06 VITALS — BP 122/51 | Ht 62.0 in | Wt 151.2 lb

## 2022-08-06 DIAGNOSIS — I10 Essential (primary) hypertension: Secondary | ICD-10-CM

## 2022-08-06 MED ORDER — AMLODIPINE BESYLATE 2.5 MG PO TABS: 2.5000 mg | ORAL_TABLET | Freq: Every day | ORAL | 1 refills | Status: DC

## 2022-08-06 NOTE — Progress Notes (Signed)
Office Visit    Patient Name: [name] Date of Encounter: 08/06/2022  Primary Care Provider:  Raliegh Ip, DO Primary Cardiologist:  Charlton Haws, MD  Chief Complaint    Hypertension - Advanced hypertension clinic  Past Medical History   Hyperlipidemia 2/24 LDL 146; on rosuvastatin 5 mg, abdominal aortic atherosclerosis noted, goal < 70  Aortic stenosis TAVR 10/2017  OSA CPAP intolerant  palpitations Uses prn metoprolol (? She isn't sure if she has this)  fibromyalgia     Allergies  Allergen Reactions   Tape Other (See Comments)    Dermatitis rash "with extended exposure"   Prolia [Denosumab] Other (See Comments)    Arthralgia/ myalgia/ jaw pain/ headache    History of Present Illness    [name] is a 87 y.o. female patient who was referred to the [facility] by Dr. Nadine Counts.  Patient reported having labile blood pressures at home, as high as 180 systolic, but dropping rapidly just a few minutes later.  She admitted to being under a lot of stress after the death of her son (suicide) and chronic pain.  When she saw Dr. Duke Salvia last month her pressure was 138/52.  Amlodipine 5 mg daily was added to her regimen.  At her first office follow up she complained of getting weak easily and taking a nap or two during the day.  Noted that even talking makes her tired.  She brought a bag of home medications but went back and forth between this being all of her meds or just her heart meds.  Unfortunately the bag only contained amlodipine, hydralazine and aspirin.  Patient tells [place] that her husband (age 33) gets her meds out for her daily and she takes whatever he gives her.  She was asked to stop losartan hctz (if she had it at home) and start olmesartan hctz 20/12.5 mg once daily, and continue amlodipine and hydralazine.    Today she returns for follow up.  States she has been doing well but has had a few occasions where she felt lightheaded and might pass  out.  No syncopal episodes occurred.  Her home meter shows some diastolic readings down to 50, systolic still somewhat elevated.    Blood Pressure Goal:  130/80  Current Medications: amlodipine 5 mg qd, olmesartan hctz 20/12.5 mg qd,  hydralazine 25 mg bid, metoprolol tart 50 mg prn  Adherence Assessment  Do you ever forget to take your medication? Yes No - but unknown what she is taking  Do you ever skip doses due to side effects? Yes No  Do you have troublRAJAH LAMBAing your medicines? Yes No  Are you ever unable to pick up your medication due to transportation difficulties? Yes No  Do you ever stop taking your medications because you don't believe they are helping? Yes No   Adherence strategy: husband doles out meds  Social Hx:      Tobacco:no  Alcohol: only margarita aobut twcie per year  Caffeine:  coffee in the morning, no soda or tea;   Diet:    eats breakfast and dinner; often chicken or beef for dinner, had sandwich last night; breakfast is cereal (Muesli) or eggs; did cut back on cookies after breakfast; no chips or crackers, no sweet tooth  Exercise: limited 2/2 fibromyalgia, arthritis   Home BP readings:  no written readings with her today.  Did bring home cuff, which read within 5 points of the office cuff today.  Machine  noted average of last 3 readings was 156/57.  She had multiple readings showing diastolic to be < 55.     Accessory Clinical Findings    Lab Results  Component Value Date   CREATININE 0.91 05/18/2022   BUN 11 05/18/2022   NA 140 05/18/2022   K 5.0 05/18/2022   CL 104 05/18/2022   CO2 25 05/18/2022   Lab Results  Component Value Date   ALT 21 05/18/2022   AST 26 05/18/2022   ALKPHOS 62 05/18/2022   BILITOT 0.3 05/18/2022   Lab Results  Component Value Date   HGBA1C 5.7 (H) 10/15/2017    Screening for Secondary Hypertension:      05/05/2022    2:19 PM  Causes  Drugs/Herbals Screened     - Comments One  coffee per day.  No NSAIDS.  No EtOH  Renovascular HTN N/A  Sleep Apnea Screened     - Comments Snores.  Daytime somnolence.  Thyroid Disease Screened     - Comments check TSH  Hyperaldosteronism N/A  Pheochromocytoma Screened     - Comments Check catecholamines and metanephrines given labile HTN  Cushing's Syndrome N/A  Hyperparathyroidism Screened  Coarctation of the Aorta Screened     - Comments BP symmetric  Compliance Screened    Relevant Labs/Studies:    Latest Ref Rng & Units 05/18/2022    9:55 AM 01/07/2022    2:12 PM 12/16/2021    2:56 PM  Basic Labs  Sodium 134 - 144 mmol/L 140  136  141   Potassium 3.5 - 5.2 mmol/L 5.0  5.2  5.4   Creatinine 0.57 - 1.00 mg/dL 1.61  0.96  0.45        Latest Ref Rng & Units 05/18/2022    9:55 AM 02/12/2020    4:22 PM  Thyroid   TSH 0.450 - 4.500 uIU/mL 3.460  2.840           Latest Ref Rng & Units 05/18/2022    9:55 AM  Metanephrines/Catecholamines   Epinephrine 0 - 62 pg/mL 36   Norepinephrine 0 - 874 pg/mL 1,062   Dopamine 0 - 48 pg/mL <30   Metanephrines 0.0 - 88.0 pg/mL <25.0   Normetanephrines  0.0 - 297.2 pg/mL 139.9             Home Medications    Current Outpatient Medications  Medication Sig Dispense Refill   amLODipine (NORVASC) 2.5 MG tablet Take 1 tablet (2.5 mg total) by mouth daily. 90 tablet 1   Ascorbic Acid (VITAMIN C) 1000 MG tablet Take 1,000 mg by mouth daily.     aspirin EC 81 MG tablet Take 1 tablet (81 mg total) by mouth daily. 90 tablet 3   furosemide (LASIX) 20 MG tablet Take 20 mg by mouth as needed.     hydrALAZINE (APRESOLINE) 25 MG tablet Take 1 tablet (25 mg total) by mouth in the morning and at bedtime. 270 tablet 3   Magnesium 500 MG CAPS Take by mouth.     metoprolol tartrate (LOPRESSOR) 50 MG tablet Take 50 mg by mouth as needed.     Misc Natural Products (TART CHERRY ADVANCED PO) Take by mouth daily.     mupirocin ointment (BACTROBAN) 2 % Apply to the affected area twice daily for 7-10 days  22 g 0   olmesartan-hydrochlorothiazide (BENICAR HCT) 20-12.5 MG tablet Take 1 tablet by mouth daily. 30 tablet 6   OVER THE COUNTER MEDICATION Take 1 capsule by  mouth 2 (two) times daily. Hemp Oil     rosuvastatin (CRESTOR) 10 MG tablet Take 1 tablet (10 mg total) by mouth daily. 90 tablet 3   traMADol (ULTRAM) 50 MG tablet Take 1 tablet (50 mg total) by mouth every 12 (twelve) hours as needed. 35 tablet 0   UNABLE TO FIND Med Name: HEMP- Tumeric cream / RUB     acetaminophen (TYLENOL) 500 MG tablet Take 1,000 mg by mouth daily as needed for moderate pain or headache.     ALOE VERA PO Take 1 capsule by mouth daily. (Patient not taking: Reported on 08/06/2022)     BEE POLLEN PO Take 400 mg by mouth daily.     clobetasol cream (TEMOVATE) 0.05 % APPLY 1 APPLICATION TOPICALLY 2 (TWO) TIMES DAILY AS NEEDED. 30 g 3   Coenzyme Q10 (COQ-10) 100 MG CAPS Take 100 mg by mouth daily. (Patient not taking: Reported on 08/06/2022)     Glucosamine-Chondroit-Vit C-Mn (GLUCOSAMINE 1500 COMPLEX PO) Take by mouth. (Patient not taking: Reported on 08/06/2022)     No current facility-administered medications for this visit.     Assessment & Plan       Essential hypertension Assessment: BP is controlled in office BP 122/51 mmHg;   Diastolic readings are on low end Tolerates current medications well without any side effects Denies SOB, palpitation, chest pain, headaches,or swelling Reiterated the importance of regular exercise and low salt diet   Plan:  Cut amlodipine from 5 mg to 2.5 mg daily Continue taking all other medications Patient to keep record of BP readings with heart rate and report to [place] at the next visit Patient to follow up with Belenda Cruise in 2 months  Labs ordered today:  none   Phillips Hay PharmD CPP [facility]  [address] [place], [place] [address] [phone]

## 2022-08-06 NOTE — Patient Instructions (Signed)
Follow up appointment: Friday June 28 at 11 am  Take your BP meds as follows: CUT AMLODIPINE TO 2.5 MG (1/2 OF THE 5 MG TABLET)  CONTINUE WITH ALL OTHER MEDICATIONS    Check your blood pressure at home daily (if able) and keep record of the readings.  Hypertension "High blood pressure"  Hypertension is often called "The Silent Killer." It rarely causes symptoms until it is extremely  high or has done damage to other organs in the body. For this reason, you should have your  blood pressure checked regularly by your physician. We will check your blood pressure  every time you see a provider at one of our offices.   Your blood pressure reading consists of two numbers. Ideally, blood pressure should be  below 120/80. The first ("top") number is called the systolic pressure. It measures the  pressure in your arteries as your heart beats. The second ("bottom") number is called the diastolic pressure. It measures the pressure in your arteries as the heart relaxes between beats.  The benefits of getting your blood pressure under control are enormous. A 10-point  reduction in systolic blood pressure can reduce your risk of stroke by 27% and heart failure by 28%  Your blood pressure goal is < 130/80  (BOTTOM NUMBER IDEALLY > 60)  To check your pressure at home you will need to:  1. Sit up in a chair, with feet flat on the floor and back supported. Do not cross your ankles or legs. 2. Rest your left arm so that the cuff is about heart level. If the cuff goes on your upper arm,  then just relax the arm on the table, arm of the chair or your lap. If you have a wrist cuff, we  suggest relaxing your wrist against your chest (think of it as Pledging the Flag with the  wrong arm).  3. Place the cuff snugly around your arm, about 1 inch above the crook of your elbow. The  cords should be inside the groove of your elbow.  4. Sit quietly, with the cuff in place, for about 5 minutes. After that 5  minutes press the power  button to start a reading. 5. Do not talk or move while the reading is taking place.  6. Record your readings on a sheet of paper. Although most cuffs have a memory, it is often  easier to see a pattern developing when the numbers are all in front of you.  7. You can repeat the reading after 1-3 minutes if it is recommended  Make sure your bladder is empty and you have not had caffeine or tobacco within the last 30 min  Always bring your blood pressure log with you to your appointments. If you have not brought your monitor in to be double checked for accuracy, please bring it to your next appointment.  You can find a list of quality blood pressure cuffs at validatebp.org

## 2022-08-06 NOTE — Assessment & Plan Note (Signed)
Assessment: BP is controlled in office BP 122/51 mmHg;   Diastolic readings are on low end Tolerates current medications well without any side effects Denies SOB, palpitation, chest pain, headaches,or swelling Reiterated the importance of regular exercise and low salt diet   Plan:  Cut amlodipine from 5 mg to 2.5 mg daily Continue taking all other medications Patient to keep record of BP readings with heart rate and report to [place] at the next visit Patient to follow up with Belenda Cruise in 2 months  Labs ordered today:  none

## 2022-08-13 ENCOUNTER — Telehealth: Payer: Self-pay | Admitting: Family Medicine

## 2022-08-13 NOTE — Telephone Encounter (Signed)
Pt checking on her dermatology referral

## 2022-08-13 NOTE — Telephone Encounter (Signed)
Contacted [name] to schedule their annual wellness visit. Appointment made for 08/18/2022.  [facility] Care Guide [facility] AWV TEAM Direct Dial: [phone]

## 2022-08-17 NOTE — Telephone Encounter (Signed)
Patient aware and verbalized understanding. °

## 2022-08-17 NOTE — Telephone Encounter (Signed)
Please let patient know.

## 2022-08-18 ENCOUNTER — Ambulatory Visit (INDEPENDENT_AMBULATORY_CARE_PROVIDER_SITE_OTHER): Payer: Medicare Other

## 2022-08-18 VITALS — Ht 62.0 in | Wt 152.0 lb

## 2022-08-18 DIAGNOSIS — Z0000 Encounter for general adult medical examination without abnormal findings: Secondary | ICD-10-CM

## 2022-08-18 NOTE — Patient Instructions (Signed)
[name] , Thank you for taking time to come for your Medicare Wellness Visit. I appreciate your ongoing commitment to your health goals. Please review the following plan we discussed and let me know if I can assist you in the future.   These are the goals we discussed:  Goals      DIET - INCREASE WATER INTAKE     Exercise 3x per week (30 min per time)        This is a list of the screening recommended for you and due dates:  Health Maintenance  Topic Date Due   COVID-19 Vaccine (1) Never done   Zoster (Shingles) Vaccine (1 of 2) Never done   Pneumonia Vaccine (1 of 2 - PCV) 12/03/2022*   Mammogram  01/08/2023*   Flu Shot  11/12/2022   Medicare Annual Wellness Visit  08/18/2023   DTaP/Tdap/Td vaccine (2 - Td or Tdap) 07/16/2028   DEXA scan (bone density measurement)  Completed   HPV Vaccine  Aged Out  *Topic was postponed. The date shown is not the original due date.    Advanced directives: Advance directive discussed with you today. I have provided a copy for you to complete at home and have notarized. Once this is complete please bring a copy in to our office so we can scan it into your chart.   Conditions/risks identified: Aim for 30 minutes of exercise or brisk walking, 6-8 glasses of water, and 5 servings of fruits and vegetables each day.   Next appointment: Follow up in one year for your annual wellness visit    Preventive Care 65 Years and Older, Female Preventive care refers to lifestyle choices and visits with your health care provider that can promote health and wellness. What does preventive care include? A yearly physical exam. This is also called an annual well check. Dental exams once or twice a year. Routine eye exams. Ask your health care provider how often you should have your eyes checked. Personal lifestyle choices, including: Daily care of your teeth and gums. Regular physical activity. Eating a healthy diet. Avoiding tobacco and drug use. Limiting  alcohol use. Practicing safe sex. Taking low-dose aspirin every day. Taking vitamin and mineral supplements as recommended by your health care provider. What happens during an annual well check? The services and screenings done by your health care provider during your annual well check will depend on your age, overall health, lifestyle risk factors, and family history of disease. Counseling  Your health care provider may ask you questions about your: Alcohol use. Tobacco use. Drug use. Emotional well-being. Home and relationship well-being. Sexual activity. Eating habits. History of falls. Memory and ability to understand (cognition). Work and work Astronomer. Reproductive health. Screening  You may have the following tests or measurements: Height, weight, and BMI. Blood pressure. Lipid and cholesterol levels. These may be checked every 5 years, or more frequently if you are over 22 years old. Skin check. Lung cancer screening. You may have this screening every year starting at age 25 if you have a 30-pack-year history of smoking and currently smoke or have quit within the past 15 years. Fecal occult blood test (FOBT) of the stool. You may have this test every year starting at age 63. Flexible sigmoidoscopy or colonoscopy. You may have a sigmoidoscopy every 5 years or a colonoscopy every 10 years starting at age 42. Hepatitis C blood test. Hepatitis B blood test. Sexually transmitted disease (STD) testing. Diabetes screening. This is done by  checking your blood sugar (glucose) after you have not eaten for a while (fasting). You may have this done every 1-3 years. Bone density scan. This is done to screen for osteoporosis. You may have this done starting at age 53. Mammogram. This may be done every 1-2 years. Talk to your health care provider about how often you should have regular mammograms. Talk with your health care provider about your test results, treatment options, and if  necessary, the need for more tests. Vaccines  Your health care provider may recommend certain vaccines, such as: Influenza vaccine. This is recommended every year. Tetanus, diphtheria, and acellular pertussis (Tdap, Td) vaccine. You may need a Td booster every 10 years. Zoster vaccine. You may need this after age 68. Pneumococcal 13-valent conjugate (PCV13) vaccine. One dose is recommended after age 80. Pneumococcal polysaccharide (PPSV23) vaccine. One dose is recommended after age 78. Talk to your health care provider about which screenings and vaccines you need and how often you need them. This information is not intended to replace advice given to you by your health care provider. Make sure you discuss any questions you have with your health care provider. Document Released: 04/26/2015 Document Revised: 12/18/2015 Document Reviewed: 01/29/2015 Elsevier Interactive Patient Education  2017 [place] Prevention in the Home Falls can cause injuries. They can happen to people of all ages. There are many things you can do to make your home safe and to help prevent falls. What can I do on the outside of my home? Regularly fix the edges of walkways and driveways and fix any cracks. Remove anything that might make you trip as you walk through a door, such as a raised step or threshold. Trim any bushes or trees on the path to your home. Use bright outdoor lighting. Clear any walking paths of anything that might make someone trip, such as rocks or tools. Regularly check to see if handrails are loose or broken. Make sure that both sides of any steps have handrails. Any raised decks and porches should have guardrails on the edges. Have any leaves, snow, or ice cleared regularly. Use sand or salt on walking paths during winter. Clean up any spills in your garage right away. This includes oil or grease spills. What can I do in the bathroom? Use night lights. Install grab bars by the toilet  and in the tub and shower. Do not use towel bars as grab bars. Use non-skid mats or decals in the tub or shower. If you need to sit down in the shower, use a plastic, non-slip stool. Keep the floor dry. Clean up any water that spills on the floor as soon as it happens. Remove soap buildup in the tub or shower regularly. Attach bath mats securely with double-sided non-slip rug tape. Do not have throw rugs and other things on the floor that can make you trip. What can I do in the bedroom? Use night lights. Make sure that you have a light by your bed that is easy to reach. Do not use any sheets or blankets that are too big for your bed. They should not hang down onto the floor. Have a firm chair that has side arms. You can use this for support while you get dressed. Do not have throw rugs and other things on the floor that can make you trip. What can I do in the kitchen? Clean up any spills right away. Avoid walking on wet floors. Keep items that you use a  lot in easy-to-reach places. If you need to reach something above you, use a strong step stool that has a grab bar. Keep electrical cords out of the way. Do not use floor polish or wax that makes floors slippery. If you must use wax, use non-skid floor wax. Do not have throw rugs and other things on the floor that can make you trip. What can I do with my stairs? Do not leave any items on the stairs. Make sure that there are handrails on both sides of the stairs and use them. Fix handrails that are broken or loose. Make sure that handrails are as long as the stairways. Check any carpeting to make sure that it is firmly attached to the stairs. Fix any carpet that is loose or worn. Avoid having throw rugs at the top or bottom of the stairs. If you do have throw rugs, attach them to the floor with carpet tape. Make sure that you have a light switch at the top of the stairs and the bottom of the stairs. If you do not have them, ask someone to add  them for you. What else can I do to help prevent falls? Wear shoes that: Do not have high heels. Have rubber bottoms. Are comfortable and fit you well. Are closed at the toe. Do not wear sandals. If you use a stepladder: Make sure that it is fully opened. Do not climb a closed stepladder. Make sure that both sides of the stepladder are locked into place. Ask someone to hold it for you, if possible. Clearly mark and make sure that you can see: Any grab bars or handrails. First and last steps. Where the edge of each step is. Use tools that help you move around (mobility aids) if they are needed. These include: Canes. Walkers. Scooters. Crutches. Turn on the lights when you go into a dark area. Replace any light bulbs as soon as they burn out. Set up your furniture so you have a clear path. Avoid moving your furniture around. If any of your floors are uneven, fix them. If there are any pets around you, be aware of where they are. Review your medicines with your doctor. Some medicines can make you feel dizzy. This can increase your chance of falling. Ask your doctor what other things that you can do to help prevent falls. This information is not intended to replace advice given to you by your health care provider. Make sure you discuss any questions you have with your health care provider. Document Released: 01/24/2009 Document Revised: 09/05/2015 Document Reviewed: 05/04/2014 Elsevier Interactive Patient Education  2017 [employer].

## 2022-08-18 NOTE — Progress Notes (Signed)
Subjective:   [name] is a 87 y.o. female who presents for Medicare Annual (Subsequent) preventive examination. I connected with  [name] on 08/18/22 by a audio enabled telemedicine application and verified that I am speaking with the correct person using two identifiers.  Patient Location: Home  Provider Location: Home Office  I discussed the limitations of evaluation and management by telemedicine. The patient expressed understanding and agreed to proceed.  Review of Systems     Cardiac Risk Factors include: advanced age (>18men, >35 women);hypertension;dyslipidemia     Objective:    Today's Vitals   08/18/22 1454  Weight: 152 lb (68.9 kg)  Height: 5\' 2"  (1.575 m)   Body mass index is 27.8 kg/m.     08/18/2022    2:59 PM 11/28/2021    3:38 PM 05/08/2021    7:10 PM 12/08/2018    3:43 PM 10/19/2017    5:06 PM 10/15/2017    9:03 AM 10/11/2017    1:08 PM  Advanced Directives  Does Patient Have a Medical Advance Directive? No Yes No Yes Yes Yes Yes  Type of Furniture conservator/restorer;Living will  Healthcare Power of [place];Living will Healthcare Power of [place];Living will Healthcare Power of [place];Living will Healthcare Power of [place];Living will  Does patient want to make changes to medical advance directive?  No - Patient declined  No - Patient declined No - Patient declined No - Patient declined No - Patient declined  Copy of Healthcare Power of Attorney in Chart?  No - copy requested  No - copy requested No - copy requested No - copy requested No - copy requested  Would patient like information on creating a medical advance directive? No - Patient declined  Yes (ED - Information included in AVS)        Current Medications (verified) Outpatient Encounter Medications as of 08/18/2022  Medication Sig   acetaminophen (TYLENOL) 500 MG tablet Take 1,000 mg by mouth daily as needed for moderate pain or headache.   ALOE VERA PO Take 1 capsule by  mouth daily.   amLODipine (NORVASC) 2.5 MG tablet Take 1 tablet (2.5 mg total) by mouth daily.   Ascorbic Acid (VITAMIN C) 1000 MG tablet Take 1,000 mg by mouth daily.   aspirin EC 81 MG tablet Take 1 tablet (81 mg total) by mouth daily.   BEE POLLEN PO Take 400 mg by mouth daily.   clobetasol cream (TEMOVATE) 0.05 % APPLY 1 APPLICATION TOPICALLY 2 (TWO) TIMES DAILY AS NEEDED.   Coenzyme Q10 (COQ-10) 100 MG CAPS Take 100 mg by mouth daily.   furosemide (LASIX) 20 MG tablet Take 20 mg by mouth as needed.   Glucosamine-Chondroit-Vit C-Mn (GLUCOSAMINE 1500 COMPLEX PO) Take by mouth.   hydrALAZINE (APRESOLINE) 25 MG tablet Take 1 tablet (25 mg total) by mouth in the morning and at bedtime.   Magnesium 500 MG CAPS Take by mouth.   metoprolol tartrate (LOPRESSOR) 50 MG tablet Take 50 mg by mouth as needed.   Misc Natural Products (TART CHERRY ADVANCED PO) Take by mouth daily.   mupirocin ointment (BACTROBAN) 2 % Apply to the affected area twice daily for 7-10 days   olmesartan-hydrochlorothiazide (BENICAR HCT) 20-12.5 MG tablet Take 1 tablet by mouth daily.   OVER THE COUNTER MEDICATION Take 1 capsule by mouth 2 (two) times daily. Hemp Oil   rosuvastatin (CRESTOR) 10 MG tablet Take 1 tablet (10 mg total) by mouth daily.   traMADol (  ULTRAM) 50 MG tablet Take 1 tablet (50 mg total) by mouth every 12 (twelve) hours as needed.   UNABLE TO FIND Med Name: HEMP- Tumeric cream / RUB   No facility-administered encounter medications on file as of 08/18/2022.    Allergies (verified) Tape and Prolia [denosumab]   History: Past Medical History:  Diagnosis Date   Chronic headaches    Fibromyalgia    GERD (gastroesophageal reflux disease)    Hemorrhoids    INTERNAL--  POST BANDING 02/ 2015   History of kidney stones    Hyperlipidemia    Hypertension    Moderate aortic stenosis    NSAID long-term use 05/30/2013   OSA (obstructive sleep apnea) CPAP INTOLERANT    Osteoporosis    S/P TAVR  (transcatheter aortic valve replacement) 10/19/2017   20 mm Edwards Sapien 3 transcatheter heart valve placed via percutaneous right transfemoral approach    Severe aortic stenosis    Spinal stenosis, multilevel    Past Surgical History:  Procedure Laterality Date   ANTERIOR CERVICAL DECOMP/DISCECTOMY FUSION  11-11-1999   C5 - C6   APPENDECTOMY  AGE 49   CARDIAC CATHETERIZATION  2008  DR NISHAN   ESSENTIALLY NORMAL   CATARACT EXTRACTION Bilateral    CATARACT EXTRACTION W/ INTRAOCULAR LENS  IMPLANT, BILATERAL  2012   EYE SURGERY     FLEXIBLE SIGMOIDOSCOPY N/A 06/01/2013   Procedure: FLEXIBLE SIGMOIDOSCOPY;  Surgeon: Louis Meckel, MD;  Location: [facility];  Service: Endoscopy;  Laterality: N/A;  may need hemorrhoidal banding   HEMORRHOIDECTOMY WITH HEMORRHOID BANDING  08-07-2010   IR RADIOLOGY PERIPHERAL GUIDED IV START  06/27/2019   IR US GUIDE VASC ACCESS RIGHT  06/27/2019   LAPAROSCOPIC CHOLECYSTECTOMY  1990   RIGHT/LEFT HEART CATH AND CORONARY ANGIOGRAPHY N/A 09/29/2017   Procedure: RIGHT/LEFT HEART CATH AND CORONARY ANGIOGRAPHY;  Surgeon: Tonny Bollman, MD;  Location[facility]VASIVE CV LAB;  Service: Cardiovascular;  Laterality: N/A;   SHOULDER ARTHROSCOPY WITH OPEN ROTATOR CUFF REPAIR AND DISTAL CLAVICLE ACROMINECTOMY Right 05/25/2012   Procedure: SHOULDER ARTHROSCOPY WITH OPEN ROTATOR CUFF REPAIR AND DISTAL CLAVICLE ACROMINECTOMY;  Surgeon: Drucilla Schmidt, MD;  Location: [facility];  Service: Orthopedics;  Laterality: Right;  RIGHT SHOULDER ARTHROSCOPY WITH DERBRIDEMENTOF LABRAL/BICEP TENDON, OPEN DISTAL CLAVICLE RESECTION, ANTERIOR ACROMINECTOMY ROTATOR CUFF REPAIR ANESTHESIA: GENERAL/SCALENE NERVE BLOCK   TEE WITHOUT CARDIOVERSION Bilateral 10/19/2017   Procedure: TRANSESOPHAGEAL ECHOCARDIOGRAM (TEE);  Surgeon: Tonny Bollman, MD;  Location: [facility] OR;  Service: Open Heart Surgery;  Laterality: Bilateral;   TENOSYNOVECTOMY Left 01/04/2014   Procedure: LEFT WRIST  EXTENSOR TENOSYNOVECTOMY;  Surgeon: Sharma Covert, MD;  Location: [facility];  Service: Orthopedics;  Laterality: Left;   THYROIDECTOMY  AGE 29   GOITER   TONSILLECTOMY AND ADENOIDECTOMY  AGE 29   TRANSCATHETER AORTIC VALVE REPLACEMENT, TRANSFEMORAL  10/19/2017   TRANSCATHETER AORTIC VALVE REPLACEMENT, TRANSFEMORAL Bilateral 10/19/2017   Procedure: TRANSCATHETER AORTIC VALVE REPLACEMENT, TRANSFEMORAL;  Surgeon: Tonny Bollman, MD;  Location: [facility] OR;  Service: Open Heart Surgery;  Laterality: Bilateral;   TRANSTHORACIC ECHOCARDIOGRAM  last one 04-20-2013  DR [facility]    NORMAL LVSF/ EF 60-65%/ MODERATE  AV  STENOSIS WITH NO AR /  MILD LAE   UMBILICAL HERNIA REPAIR  JUNE 2006   VAGINAL HYSTERECTOMY  01-31-2001   ANTERIOR & POSTERIOR REPAIR/ TRANSVAGINAL BLADDER SLING   Family History  Problem Relation Age of Onset   Cirrhosis Mother        drinking   Heart  disease Sister    Other Brother        duodenal ulcer   Heart failure Maternal Grandmother    Gallbladder disease Maternal Grandmother    Stomach cancer Paternal Grandfather    Cancer Son 65       colon   Social History   Socioeconomic History   Marital status: Married    Spouse name: Aliyannah Corrie   Number of children: 6   Years of education: 6   Highest education level: 6th grade  Occupational History   Occupation: Retired  Tobacco Use   Smoking status: Former    Packs/day: 0.25    Years: 5.00    Additional pack years: 0.00    Total pack years: 1.25    Types: Cigarettes    Quit date: 04/14/2003    Years since quitting: 19.3    Passive exposure: Past   Smokeless tobacco: Never  Vaping Use   Vaping Use: Never used  Substance and Sexual Activity   Alcohol use: Yes    Comment: once in a while-social   Drug use: Never    Comment: hemp oil    Sexual activity: Not Currently    Partners: Male  Other Topics Concern   Not on file  Social History Narrative   Not on file   Social Determinants of Health    Financial Resource Strain: Low Risk  (08/18/2022)   Overall Financial Resource Strain (CARDIA)    Difficulty of Paying Living Expenses: Not hard at all  Food Insecurity: No Food Insecurity (08/18/2022)   Hunger Vital Sign    Worried About Running Out of Food in the Last Year: Never true    Ran Out of Food in the Last Year: Never true  Transportation Needs: No Transportation Needs (08/18/2022)   PRAPARE - Administrator, Civil Service (Medical): No    Lack of Transportation (Non-Medical): No  Physical Activity: Inactive (08/18/2022)   Exercise Vital Sign    Days of Exercise per Week: 0 days    Minutes of Exercise per Session: 0 min  Stress: No Stress Concern Present (08/18/2022)   [employer] of Occupational Health - Occupational Stress Questionnaire    Feeling of Stress : Not at all  Social Connections: Moderately Integrated (08/18/2022)   Social Connection and Isolation Panel [NHANES]    Frequency of Communication with Friends and Family: More than three times a week    Frequency of Social Gatherings with Friends and Family: More than three times a week    Attends Religious Services: More than 4 times per year    Active Member of [employer] or Organizations: No    Attends Engineer, structural: Never    Marital Status: Married    Tobacco Counseling Counseling given: Not Answered   Clinical Intake:  Pre-visit preparation completed: Yes  Pain : No/denies pain     Nutritional Risks: None Diabetes: No  How often do you need to have someone help you when you read instructions, pamphlets, or other written materials from your doctor or pharmacy?: 1 - Never  Diabetic?no   Interpreter Needed?: No  Information entered by :: Renie Ora, LPN   Activities of Daily Living    08/18/2022    2:59 PM 08/14/2022    1:38 PM  In your present state of health, do you have any difficulty performing the following activities:  Hearing? 0 0  Vision? 0 0  Difficulty  concentrating or making decisions? 0 0  Walking  or climbing stairs? 0 1  Dressing or bathing? 0 0  Doing errands, shopping? 0 0  Preparing Food and eating ? N N  Using the Toilet? N N  In the past six months, have you accidently leaked urine? N N  Do you have problems with loss of bowel control? N N  Managing your Medications? N N  Managing your Finances? N N  Housekeeping or managing your Housekeeping? N N    Patient Care Team: Raliegh Ip, DO as PCP - General (Family Medicine) Wendall Stade, MD as PCP - Cardiology (Cardiology) Wendall Stade, MD as Consulting Physician (Cardiology) Louis Meckel, MD (Inactive) as Consulting Physician (Gastroenterology) [place], Illene Labrador, MD (Inactive) as Consulting Physician (Orthopedic Surgery) Milas Gain, MD as Referring Physician (Neurology) Jethro Bolus, MD as Consulting Physician (Ophthalmology)  Indicate any recent Medical Services you may have received from other than [facility] providers in the past year (date may be approximate).     Assessment:   This is a routine wellness examination for Disa.  Hearing/Vision screen Vision Screening - Comments:: Wears rx glasses - up to date with routine eye exams with  Dr.Shipiro  Dietary issues and exercise activities discussed: Current Exercise Habits: The patient does not participate in regular exercise at present, Exercise limited by: cardiac condition(s)   Goals Addressed             This Visit's Progress    DIET - INCREASE WATER INTAKE         Depression Screen    08/18/2022    2:58 PM 01/07/2022    1:19 PM 12/02/2021    3:26 PM 11/28/2021    3:34 PM 08/15/2021   12:59 PM 05/16/2021    9:37 AM 07/15/2020    1:06 PM  PHQ 2/9 Scores  PHQ - 2 Score 0 0 0 0  0 0  PHQ- 9 Score  0 0   3   Exception Documentation     Patient refusal      Fall Risk    08/18/2022    2:56 PM 08/14/2022    1:38 PM 01/07/2022    1:19 PM 12/02/2021    3:26 PM 11/28/2021    3:37 PM  Fall Risk    Falls in the past year? 0 0 0 0 0  Number falls in past yr: 0    0  Injury with Fall? 0 0   0  Risk for fall due to : No Fall Risks    No Fall Risks  Follow up Falls prevention discussed    Falls evaluation completed;Education provided;Falls prevention discussed    FALL RISK PREVENTION PERTAINING TO THE HOME:  Any stairs in or around the home? Yes  If so, are there any without handrails? No  Home free of loose throw rugs in walkways, pet beds, electrical cords, etc? Yes  Adequate lighting in your home to reduce risk of falls? Yes   ASSISTIVE DEVICES UTILIZED TO PREVENT FALLS:  Life alert? No  Use of a cane, walker or w/c? No  Grab bars in the bathroom? Yes  Shower chair or bench in shower? Yes  Elevated toilet seat or a handicapped toilet? Yes       04/21/2016    3:36 PM 11/08/2014    9:36 AM 07/26/2014   10:37 AM  MMSE - Mini Mental State Exam  Orientation to time 5 5 5   Orientation to Place 5 5 5   Registration 3 3 3  Attention/ Calculation 5 5 5   Recall 3 3 3   Language- name 2 objects 2 2 2   Language- repeat 1 1 1   Language- follow 3 step command 3 3 3   Language- read & follow direction 1 1 1   Write a sentence 1 1 1   Copy design 1 1 1   Total score 30 30 30         08/18/2022    2:59 PM 12/08/2018    3:44 PM  6CIT Screen  What Year? 0 points 0 points  What month? 0 points 0 points  What time? 0 points 0 points  Count back from 20 0 points 0 points  Months in reverse 0 points 0 points  Repeat phrase 0 points 0 points  Total Score 0 points 0 points    Immunizations Immunization History  Administered Date(s) Administered   Influenza,inj,Quad PF,6+ Mos 02/01/2013   Tdap 07/17/2018    TDAP status: Up to date  Flu Vaccine status: Declined, Education has been provided regarding the importance of this vaccine but patient still declined. Advised may receive this vaccine at local pharmacy or Health Dept. Aware to provide a copy of the vaccination record if obtained  from local pharmacy or Health Dept. Verbalized acceptance and understanding.  Pneumococcal vaccine status: Due, Education has been provided regarding the importance of this vaccine. Advised may receive this vaccine at local pharmacy or Health Dept. Aware to provide a copy of the vaccination record if obtained from local pharmacy or Health Dept. Verbalized acceptance and understanding.  Covid-19 vaccine status: Declined, Education has been provided regarding the importance of this vaccine but patient still declined. Advised may receive this vaccine at local pharmacy or Health Dept.or vaccine clinic. Aware to provide a copy of the vaccination record if obtained from local pharmacy or Health Dept. Verbalized acceptance and understanding.  Qualifies for Shingles Vaccine? Yes   Zostavax completed No   Shingrix Completed?: No.    Education has been provided regarding the importance of this vaccine. Patient has been advised to call insurance company to determine out of pocket expense if they have not yet received this vaccine. Advised may also receive vaccine at local pharmacy or Health Dept. Verbalized acceptance and understanding.  Screening Tests Health Maintenance  Topic Date Due   COVID-19 Vaccine (1) Never done   Zoster Vaccines- Shingrix (1 of 2) Never done   Pneumonia Vaccine 40+ Years old (1 of 2 - PCV) 12/03/2022 (Originally 09/04/1941)   MAMMOGRAM  01/08/2023 (Originally 09/12/2021)   INFLUENZA VACCINE  11/12/2022   Medicare Annual Wellness (AWV)  08/18/2023   DTaP/Tdap/Td (2 - Td or Tdap) 07/16/2028   DEXA SCAN  Completed   HPV VACCINES  Aged Out    Health Maintenance  Health Maintenance Due  Topic Date Due   COVID-19 Vaccine (1) Never done   Zoster Vaccines- Shingrix (1 of 2) Never done    Colorectal cancer screening: No longer required.   Mammogram status: No longer required due to age.  Bone Density status: Completed 12/10/2016. Results reflect: Bone density results:  OSTEOPOROSIS. Repeat every 2 years.  Lung Cancer Screening: (Low Dose CT Chest recommended if Age 71-80 years, 30 pack-year currently smoking OR have quit w/in 15years.) does not qualify.   Lung Cancer Screening Referral: n/a  Additional Screening:  Hepatitis C Screening: does not qualify;   Vision Screening: Recommended annual ophthalmology exams for early detection of glaucoma and other disorders of the eye. Is the patient up to date with their  annual eye exam?  Yes  Who is the provider or what is the name of the office in which the patient attends annual eye exams? Dr.Shipiro  If pt is not established with a provider, would they like to be referred to a provider to establish care? No .   Dental Screening: Recommended annual dental exams for proper oral hygiene  Community Resource Referral / Chronic Care Management: CRR required this visit?  No   CCM required this visit?  No      Plan:     I have personally reviewed and noted the following in the patient's chart:   Medical and social history Use of alcohol, tobacco or illicit drugs  Current medications and supplements including opioid prescriptions. Patient is not currently taking opioid prescriptions. Functional ability and status Nutritional status Physical activity Advanced directives List of other physicians Hospitalizations, surgeries, and ER visits in previous 12 months Vitals Screenings to include cognitive, depression, and falls Referrals and appointments  In addition, I have reviewed and discussed with patient certain preventive protocols, quality metrics, and best practice recommendations. A written personalized care plan for preventive services as well as general preventive health recommendations were provided to patient.     Lorrene Reid, LPN   12/12/4780   Nurse Notes: Due Pneumonia vaccine

## 2022-10-01 ENCOUNTER — Encounter: Payer: Self-pay | Admitting: Family

## 2022-10-01 ENCOUNTER — Ambulatory Visit (INDEPENDENT_AMBULATORY_CARE_PROVIDER_SITE_OTHER): Payer: Medicare Other

## 2022-10-01 ENCOUNTER — Ambulatory Visit (INDEPENDENT_AMBULATORY_CARE_PROVIDER_SITE_OTHER): Payer: Medicare Other | Admitting: Family

## 2022-10-01 VITALS — BP 107/54 | HR 104 | Temp 97.8°F | Ht 62.0 in | Wt 147.2 lb

## 2022-10-01 DIAGNOSIS — I10 Essential (primary) hypertension: Secondary | ICD-10-CM

## 2022-10-01 DIAGNOSIS — J208 Acute bronchitis due to other specified organisms: Secondary | ICD-10-CM | POA: Diagnosis not present

## 2022-10-01 DIAGNOSIS — R051 Acute cough: Secondary | ICD-10-CM | POA: Diagnosis not present

## 2022-10-01 DIAGNOSIS — R231 Pallor: Secondary | ICD-10-CM

## 2022-10-01 DIAGNOSIS — B9689 Other specified bacterial agents as the cause of diseases classified elsewhere: Secondary | ICD-10-CM | POA: Diagnosis not present

## 2022-10-01 DIAGNOSIS — R059 Cough, unspecified: Secondary | ICD-10-CM | POA: Diagnosis not present

## 2022-10-01 MED ORDER — DOXYCYCLINE HYCLATE 100 MG PO TABS
100.0000 mg | ORAL_TABLET | Freq: Two times a day (BID) | ORAL | 0 refills | Status: DC
Start: 2022-10-01 — End: 2022-10-13

## 2022-10-01 NOTE — Addendum Note (Signed)
Addended by: Jannifer Rodney A on: 10/01/2022 04:13 PM   Modules accepted: Orders

## 2022-10-01 NOTE — Progress Notes (Signed)
Subjective:    Patient ID: [name], female    DOB: 01/23/1936, 87 y.o.   MRN: [id number]  Chief Complaint  Patient presents with   Cough    sinus    Cough This is a new problem. The current episode started in the past 7 days. The problem has been gradually worsening. The problem occurs every few minutes. The cough is Productive of sputum. Associated symptoms include ear pain, headaches, myalgias, nasal congestion, a sore throat and wheezing. Pertinent negatives include no chills, ear congestion, fever or shortness of breath. She has tried rest (tylenol) for the symptoms. The treatment provided mild relief.      Review of Systems  Constitutional:  Negative for chills and fever.  HENT:  Positive for ear pain and sore throat.   Respiratory:  Positive for cough and wheezing. Negative for shortness of breath.   Musculoskeletal:  Positive for myalgias.  Neurological:  Positive for headaches.  All other systems reviewed and are negative.      Objective:   Physical Exam Vitals reviewed.  Constitutional:      General: She is not in acute distress.    Appearance: She is well-developed.  HENT:     Head: Normocephalic and atraumatic.  Eyes:     Pupils: Pupils are equal, round, and reactive to light.  Neck:     Thyroid: No thyromegaly.  Cardiovascular:     Rate and Rhythm: Normal rate and regular rhythm.     Heart sounds: Normal heart sounds. No murmur heard. Pulmonary:     Effort: Pulmonary effort is normal. No respiratory distress.     Breath sounds: Wheezing and rhonchi present.  Abdominal:     General: Bowel sounds are normal. There is no distension.     Palpations: Abdomen is soft.     Tenderness: There is no abdominal tenderness.  Musculoskeletal:        General: No tenderness. Normal range of motion.     Cervical back: Normal range of motion and neck supple.  Skin:    General: Skin is warm and dry.     Coloration: Skin is pale.  Neurological:     Mental Status:  She is alert and oriented to person, place, and time.     Cranial Nerves: No cranial nerve deficit.     Deep Tendon Reflexes: Reflexes are normal and symmetric.  Psychiatric:        Behavior: Behavior normal.        Thought Content: Thought content normal.        Judgment: Judgment normal.      BP (!) 107/54   Pulse (!) 104   Temp 97.8 F (36.6 C) (Temporal)   Ht 5\' 2"  (1.575 m)   Wt 147 lb 3.2 oz (66.8 kg)   SpO2 95%   BMI 26.92 kg/m       Assessment & Plan:  [name] comes in today with chief complaint of Cough (sinus)   Diagnosis and orders addressed:  1. Essential hypertension - DG Chest 2 View  2. Pale - CBC with Differential/Platelet  3. Acute bacterial bronchitis - Take meds as prescribed - Use a cool mist humidifier  -Use saline nose sprays frequently -Force fluids -For any cough or congestion  Use plain Mucinex- regular strength or max strength is fine -For fever or aces or pains- take tylenol or ibuprofen. -Throat lozenges if help -Follow up if symptoms worse or do not improve  - CBC  with Differential/Platelet - DG Chest 2 View  4. Acute cough - DG Chest 2 View   Labs pending Health Maintenance reviewed Diet and exercise encouraged  Follow up plan: Keep follow up with PCP  Jannifer Rodney, FNP

## 2022-10-01 NOTE — Patient Instructions (Signed)

## 2022-10-02 ENCOUNTER — Telehealth: Payer: Self-pay | Admitting: Family

## 2022-10-02 LAB — CBC WITH DIFFERENTIAL/PLATELET
Basophils Absolute: 0 10*3/uL (ref 0.0–0.2)
Basos: 1 %
EOS (ABSOLUTE): 0.2 10*3/uL (ref 0.0–0.4)
Eos: 3 %
Hematocrit: 36.6 % (ref 34.0–46.6)
Hemoglobin: 12.4 g/dL (ref 11.1–15.9)
Immature Grans (Abs): 0 10*3/uL (ref 0.0–0.1)
Immature Granulocytes: 0 %
Lymphocytes Absolute: 1.6 10*3/uL (ref 0.7–3.1)
Lymphs: 27 %
MCH: 29.7 pg (ref 26.6–33.0)
MCHC: 33.9 g/dL (ref 31.5–35.7)
MCV: 88 fL (ref 79–97)
Monocytes Absolute: 0.8 10*3/uL (ref 0.1–0.9)
Monocytes: 14 %
Neutrophils Absolute: 3.2 10*3/uL (ref 1.4–7.0)
Neutrophils: 55 %
Platelets: 186 10*3/uL (ref 150–450)
RBC: 4.17 x10E6/uL (ref 3.77–5.28)
RDW: 12.9 % (ref 11.7–15.4)
WBC: 5.8 10*3/uL (ref 3.4–10.8)

## 2022-10-02 MED ORDER — BENZONATATE 200 MG PO CAPS: 200.0000 mg | ORAL_CAPSULE | Freq: Three times a day (TID) | ORAL | 1 refills | Status: DC | PRN

## 2022-10-02 MED ORDER — PROMETHAZINE-DM 6.25-15 MG/5ML PO SYRP: 5.0000 mL | ORAL_SOLUTION | Freq: Three times a day (TID) | ORAL | 0 refills | Status: DC | PRN

## 2022-10-02 NOTE — Telephone Encounter (Signed)
Two cough medications Prescription sent to pharmacy

## 2022-10-02 NOTE — Telephone Encounter (Signed)
Patient is very upset because she hadn't heard back on medication for cough. Has never experienced this here at the office where the provider was supposed to send medication for cough at her appt and never called it in. Has coughed the last three nights with no rest.

## 2022-10-02 NOTE — Telephone Encounter (Signed)
Pt says [name] was supposed to prescribe her a cough medicine yesterday along with the doxycycline but says she only sent in the doxycycline. Pt is requesting that cough medicine be sent too.

## 2022-10-05 ENCOUNTER — Ambulatory Visit: Payer: Medicare Other | Admitting: Dermatology

## 2022-10-09 ENCOUNTER — Ambulatory Visit (HOSPITAL_BASED_OUTPATIENT_CLINIC_OR_DEPARTMENT_OTHER): Payer: Medicare Other

## 2022-10-13 ENCOUNTER — Encounter: Payer: Self-pay | Admitting: Family Medicine

## 2022-10-13 ENCOUNTER — Ambulatory Visit: Payer: Medicare Other | Admitting: Family Medicine

## 2022-10-13 VITALS — BP 136/45 | HR 83 | Temp 97.1°F | Ht 62.0 in | Wt 146.4 lb

## 2022-10-13 DIAGNOSIS — J208 Acute bronchitis due to other specified organisms: Secondary | ICD-10-CM

## 2022-10-13 DIAGNOSIS — B9689 Other specified bacterial agents as the cause of diseases classified elsewhere: Secondary | ICD-10-CM

## 2022-10-13 MED ORDER — HYDROCODONE BIT-HOMATROP MBR 5-1.5 MG/5ML PO SOLN: 5.0000 mL | Freq: Four times a day (QID) | ORAL | 0 refills | Status: AC | PRN

## 2022-10-13 MED ORDER — AMOXICILLIN-POT CLAVULANATE 875-125 MG PO TABS
1.0000 | ORAL_TABLET | Freq: Two times a day (BID) | ORAL | 0 refills | Status: DC
Start: 2022-10-13 — End: 2022-11-25

## 2022-10-13 NOTE — Progress Notes (Signed)
Subjective:  Patient ID: [name], female    DOB: 1935/04/20  Age: 87 y.o. MRN: [id number]  CC: Cough (3 WEEKS), POOR APPETITE, Sore Throat, Nasal Congestion, and Headache   HPI [name] presents for productive cough. SoreThroat. Has had pneumonia 3 times in the past. Was here recently for cough. Got doxycycline. Not better. Denies fever and dyspnea     10/13/2022   10:49 AM 08/18/2022    2:58 PM 01/07/2022    1:19 PM  Depression screen PHQ 2/9  Decreased Interest 0 0 0  Down, Depressed, Hopeless 0 0 0  PHQ - 2 Score 0 0 0  Altered sleeping   0  Tired, decreased energy   0  Change in appetite   0  Feeling bad or failure about yourself    0  Trouble concentrating   0  Moving slowly or fidgety/restless   0  Suicidal thoughts   0  PHQ-9 Score   0  Difficult doing work/chores   Not difficult at all    History [name] has a past medical history of Chronic headaches, Fibromyalgia, GERD (gastroesophageal reflux disease), Hemorrhoids, History of kidney stones, Hyperlipidemia, Hypertension, Moderate aortic stenosis, NSAID long-term use (05/30/2013), OSA (obstructive sleep apnea) (CPAP INTOLERANT ), Osteoporosis, S/P TAVR (transcatheter aortic valve replacement) (10/19/2017), Severe aortic stenosis, and Spinal stenosis, multilevel.   She has a past surgical history that includes Umbilical hernia repair [name] 2006); Vaginal hysterectomy (01-31-2001); Anterior cervical decomp/discectomy fusion (11-11-1999); Thyroidectomy (AGE 48); Hemorrhoidectomy with hemorrhoid banding (08-07-2010); Laparoscopic cholecystectomy (1990); Appendectomy (AGE 72); Tonsillectomy and adenoidectomy (AGE 59); Cataract extraction w/ intraocular lens  implant, bilateral (2012); transthoracic echocardiogram (last one 04-20-2013  DR [facility]); Cardiac catheterization (2008  DR [facility]); Shoulder arthroscopy with open rotator cuff repair and distal clavicle acrominectomy (Right, 05/25/2012); Flexible sigmoidoscopy (N/A,  06/01/2013); Tenosynovectomy (Left, 01/04/2014); RIGHT/LEFT HEART CATH AND CORONARY ANGIOGRAPHY (N/A, 09/29/2017); Transcatheter aortic valve replacement, transfemoral (10/19/2017); Transcatheter aortic valve replacement, transfemoral (Bilateral, 10/19/2017); TEE without cardioversion (Bilateral, 10/19/2017); Eye surgery; Cataract extraction (Bilateral); IR US Guide Vasc Access Right (06/27/2019); and IR RADIOLOGY PERIPHERAL GUIDED IV START (06/27/2019).   Her family history includes Cancer (age of onset: 80) in her son; Cirrhosis in her mother; Gallbladder disease in her maternal grandmother; Heart disease in her sister; Heart failure in her maternal grandmother; Other in her brother; Stomach cancer in her paternal grandfather.She reports that she quit smoking about 19 years ago. Her smoking use included cigarettes. She has a 1.25 pack-year smoking history. She has been exposed to tobacco smoke. She has never used smokeless tobacco. She reports current alcohol use. She reports that she does not use drugs.    ROS Review of Systems  Constitutional:  Positive for activity change, appetite change (diminished) and fatigue. Negative for fever.  HENT: Negative.    Eyes:  Negative for visual disturbance.  Respiratory:  Positive for cough (thick and white, yellow at irst.) and wheezing. Negative for shortness of breath.   Cardiovascular:  Negative for chest pain.  Gastrointestinal:  Negative for abdominal pain.  Musculoskeletal:  Negative for arthralgias.    Objective:  BP (!) 136/45   Pulse 83   Temp (!) 97.1 F (36.2 C)   Ht 5\' 2"  (1.575 m)   Wt 146 lb 6.4 oz (66.4 kg)   SpO2 98%   BMI 26.78 kg/m   BP Readings from Last 3 Encounters:  10/13/22 (!) 136/45  10/01/22 (!) 107/54  08/06/22 (!) 122/51    Wt  Readings from Last 3 Encounters:  10/13/22 146 lb 6.4 oz (66.4 kg)  10/01/22 147 lb 3.2 oz (66.8 kg)  08/18/22 152 lb (68.9 kg)     Physical Exam Constitutional:      General: She is not in  acute distress.    Appearance: She is well-developed.  Cardiovascular:     Rate and Rhythm: Normal rate and regular rhythm.  Pulmonary:     Breath sounds: Normal breath sounds.  Musculoskeletal:        General: Normal range of motion.  Skin:    General: Skin is warm and dry.  Neurological:     Mental Status: She is alert and oriented to person, place, and time.       Assessment & Plan:   [name] was seen today for cough, poor appetite, sore throat, nasal congestion and headache.  Diagnoses and all orders for this visit:  Acute bacterial bronchitis  Other orders -     amoxicillin-clavulanate (AUGMENTIN) 875-125 MG tablet; Take 1 tablet by mouth 2 (two) times daily. Take all of this medication -     HYDROcodone bit-homatropine (HYCODAN) 5-1.5 MG/5ML syrup; Take 5 mLs by mouth every 6 (six) hours as needed for up to 5 days for cough.       I have discontinued [name]'s traMADol, doxycycline, and promethazine-dextromethorphan. I am also having her start on amoxicillin-clavulanate and HYDROcodone bit-homatropine. Additionally, I am having her maintain her BEE POLLEN PO, OVER THE COUNTER MEDICATION, ALOE VERA PO, CoQ-10, acetaminophen, aspirin EC, vitamin C, clobetasol cream, mupirocin ointment, UNABLE TO FIND, Misc Natural Products (TART CHERRY ADVANCED PO), Magnesium, Glucosamine-Chondroit-Vit C-Mn (GLUCOSAMINE 1500 COMPLEX PO), furosemide, metoprolol tartrate, hydrALAZINE, olmesartan-hydrochlorothiazide, rosuvastatin, amLODipine, and benzonatate.  Allergies as of 10/13/2022       Reactions   Tape Other (See Comments)   Dermatitis rash "with extended exposure"   Prolia [denosumab] Other (See Comments)   Arthralgia/ myalgia/ jaw pain/ headache        Medication List        Accurate as of October 13, 2022 10:36 PM. If you have any questions, ask your nurse or doctor.          STOP taking these medications    doxycycline 100 MG tablet Commonly known as:  VIBRA-TABS Stopped by: Mechele Claude, MD   promethazine-dextromethorphan 6.25-15 MG/5ML syrup Commonly known as: PROMETHAZINE-DM Stopped by: Mechele Claude, MD   traMADol 50 MG tablet Commonly known as: ULTRAM Stopped by: Mechele Claude, MD       TAKE these medications    acetaminophen 500 MG tablet Commonly known as: TYLENOL Take 1,000 mg by mouth daily as needed for moderate pain or headache.   ALOE VERA PO Take 1 capsule by mouth daily.   amLODipine 2.5 MG tablet Commonly known as: NORVASC Take 1 tablet (2.5 mg total) by mouth daily.   amoxicillin-clavulanate 875-125 MG tablet Commonly known as: AUGMENTIN Take 1 tablet by mouth 2 (two) times daily. Take all of this medication Started by: Mechele Claude, MD   aspirin EC 81 MG tablet Take 1 tablet (81 mg total) by mouth daily.   BEE POLLEN PO Take 400 mg by mouth daily.   benzonatate 200 MG capsule Commonly known as: TESSALON Take 1 capsule (200 mg total) by mouth 3 (three) times daily as needed.   clobetasol cream 0.05 % Commonly known as: TEMOVATE APPLY 1 APPLICATION TOPICALLY 2 (TWO) TIMES DAILY AS NEEDED.   CoQ-10 100 MG Caps Take 100 mg  by mouth daily.   furosemide 20 MG tablet Commonly known as: LASIX Take 20 mg by mouth as needed.   GLUCOSAMINE 1500 COMPLEX PO Take by mouth.   hydrALAZINE 25 MG tablet Commonly known as: APRESOLINE Take 1 tablet (25 mg total) by mouth in the morning and at bedtime.   HYDROcodone bit-homatropine 5-1.5 MG/5ML syrup Commonly known as: HYCODAN Take 5 mLs by mouth every 6 (six) hours as needed for up to 5 days for cough. Started by: Mechele Claude, MD   Magnesium 500 MG Caps Take by mouth.   metoprolol tartrate 50 MG tablet Commonly known as: LOPRESSOR Take 50 mg by mouth as needed.   mupirocin ointment 2 % Commonly known as: Bactroban Apply to the affected area twice daily for 7-10 days   olmesartan-hydrochlorothiazide 20-12.5 MG tablet Commonly known as:  BENICAR HCT Take 1 tablet by mouth daily.   OVER THE COUNTER MEDICATION Take 1 capsule by mouth 2 (two) times daily. Hemp Oil   rosuvastatin 10 MG tablet Commonly known as: CRESTOR Take 1 tablet (10 mg total) by mouth daily.   TART CHERRY ADVANCED PO Take by mouth daily.   UNABLE TO FIND Med Name: HEMP- Tumeric cream / RUB   vitamin C 1000 MG tablet Take 1,000 mg by mouth daily.         Follow-up: Return if symptoms worsen or fail to improve.  Mechele Claude, M.D.

## 2022-10-23 ENCOUNTER — Telehealth: Payer: Self-pay | Admitting: Family Medicine

## 2022-10-23 NOTE — Telephone Encounter (Signed)
Spoke with pt, appt made for next Tuesday 10/27/22

## 2022-10-27 ENCOUNTER — Ambulatory Visit: Payer: Medicare Other | Admitting: Family Medicine

## 2022-10-27 ENCOUNTER — Encounter: Payer: Self-pay | Admitting: Family Medicine

## 2022-10-27 VITALS — BP 147/62 | HR 90 | Temp 98.8°F | Ht 62.0 in | Wt 145.0 lb

## 2022-10-27 DIAGNOSIS — J329 Chronic sinusitis, unspecified: Secondary | ICD-10-CM | POA: Diagnosis not present

## 2022-10-27 MED ORDER — AZELASTINE HCL 0.1 % NA SOLN
1.0000 | Freq: Two times a day (BID) | NASAL | 99 refills | Status: DC | PRN
Start: 2022-10-27 — End: 2022-11-25

## 2022-10-27 MED ORDER — GUAIFENESIN ER 600 MG PO TB12
600.0000 mg | ORAL_TABLET | Freq: Two times a day (BID) | ORAL | 0 refills | Status: DC | PRN
Start: 2022-10-27 — End: 2022-11-25

## 2022-10-27 MED ORDER — DESLORATADINE 5 MG PO TABS
5.0000 mg | ORAL_TABLET | Freq: Every day | ORAL | 99 refills | Status: DC | PRN
Start: 2022-10-27 — End: 2022-11-25

## 2022-10-27 NOTE — Progress Notes (Signed)
Subjective: CC: Sick PCP: Raliegh Ip, DO HPI:[name] is a 87 y.o. female presenting to clinic today for:  1.  Chronic sinusitis Patient reports that she has been experiencing nasal drainage, cough.  Sometimes the cough is dry and she finds it difficult to get mucus up.  She has been seen x 2 for bronchitis and URI, both treated with oral antibiotics and wants treated with cough medication.  These really did not improve or alter symptoms at all.  She notes that prior to onset of illness she had been exposed posed to somebody that had a cold virus.  She self tested for COVID at home and this was negative.  She denies any hemoptysis, shortness of breath but feels generalized malaise and just overall not well.  She typically pursues a naturopathic remedies for treatment.  She has been on colloidal silver for several years now and this typically keeps her symptoms of allergies at bay.  She also utilizes honey for cough suppressant and as a naturopathic remedy for allergy relief.  She is not currently treated with any nasal sprays or oral antihistamines   ROS: Per HPI  Allergies  Allergen Reactions   Tape Other (See Comments)    Dermatitis rash "with extended exposure"   Prolia [Denosumab] Other (See Comments)    Arthralgia/ myalgia/ jaw pain/ headache   Past Medical History:  Diagnosis Date   Chronic headaches    Fibromyalgia    GERD (gastroesophageal reflux disease)    Hemorrhoids    INTERNAL--  POST BANDING 02/ 2015   History of kidney stones    Hyperlipidemia    Hypertension    Moderate aortic stenosis    NSAID long-term use 05/30/2013   OSA (obstructive sleep apnea) CPAP INTOLERANT    Osteoporosis    S/P TAVR (transcatheter aortic valve replacement) 10/19/2017   20 mm Edwards Sapien 3 transcatheter heart valve placed via percutaneous right transfemoral approach    Severe aortic stenosis    Spinal stenosis, multilevel     Current Outpatient Medications:     acetaminophen (TYLENOL) 500 MG tablet, Take 1,000 mg by mouth daily as needed for moderate pain or headache., Disp: , Rfl:    ALOE VERA PO, Take 1 capsule by mouth daily., Disp: , Rfl:    amLODipine (NORVASC) 2.5 MG tablet, Take 1 tablet (2.5 mg total) by mouth daily., Disp: 90 tablet, Rfl: 1   amoxicillin-clavulanate (AUGMENTIN) 875-125 MG tablet, Take 1 tablet by mouth 2 (two) times daily. Take all of this medication, Disp: 20 tablet, Rfl: 0   Ascorbic Acid (VITAMIN C) 1000 MG tablet, Take 1,000 mg by mouth daily., Disp: , Rfl:    aspirin EC 81 MG tablet, Take 1 tablet (81 mg total) by mouth daily., Disp: 90 tablet, Rfl: 3   BEE POLLEN PO, Take 400 mg by mouth daily., Disp: , Rfl:    benzonatate (TESSALON) 200 MG capsule, Take 1 capsule (200 mg total) by mouth 3 (three) times daily as needed., Disp: 30 capsule, Rfl: 1   clobetasol cream (TEMOVATE) 0.05 %, APPLY 1 APPLICATION TOPICALLY 2 (TWO) TIMES DAILY AS NEEDED., Disp: 30 g, Rfl: 3   Coenzyme Q10 (COQ-10) 100 MG CAPS, Take 100 mg by mouth daily., Disp: , Rfl:    furosemide (LASIX) 20 MG tablet, Take 20 mg by mouth as needed., Disp: , Rfl:    Glucosamine-Chondroit-Vit C-Mn (GLUCOSAMINE 1500 COMPLEX PO), Take by mouth., Disp: , Rfl:    hydrALAZINE (APRESOLINE) 25  MG tablet, Take 1 tablet (25 mg total) by mouth in the morning and at bedtime., Disp: 270 tablet, Rfl: 3   Magnesium 500 MG CAPS, Take by mouth., Disp: , Rfl:    metoprolol tartrate (LOPRESSOR) 50 MG tablet, Take 50 mg by mouth as needed., Disp: , Rfl:    Misc Natural Products (TART CHERRY ADVANCED PO), Take by mouth daily., Disp: , Rfl:    mupirocin ointment (BACTROBAN) 2 %, Apply to the affected area twice daily for 7-10 days, Disp: 22 g, Rfl: 0   olmesartan-hydrochlorothiazide (BENICAR HCT) 20-12.5 MG tablet, Take 1 tablet by mouth daily., Disp: 30 tablet, Rfl: 6   OVER THE COUNTER MEDICATION, Take 1 capsule by mouth 2 (two) times daily. Hemp Oil, Disp: , Rfl:    rosuvastatin  (CRESTOR) 10 MG tablet, Take 1 tablet (10 mg total) by mouth daily., Disp: 90 tablet, Rfl: 3   UNABLE TO FIND, Med Name: HEMP- Tumeric cream / RUB, Disp: , Rfl:  Social History   Socioeconomic History   Marital status: Married    Spouse name: Judeen Geralds   Number of children: 6   Years of education: 6   Highest education level: 6th grade  Occupational History   Occupation: Retired  Tobacco Use   Smoking status: Former    Current packs/day: 0.00    Average packs/day: 0.3 packs/day for 5.0 years (1.3 ttl pk-yrs)    Types: Cigarettes    Start date: 04/13/1998    Quit date: 04/14/2003    Years since quitting: 19.5    Passive exposure: Past   Smokeless tobacco: Never  Vaping Use   Vaping status: Never Used  Substance and Sexual Activity   Alcohol use: Yes    Comment: once in a while-social   Drug use: Never    Comment: hemp oil    Sexual activity: Not Currently    Partners: Male  Other Topics Concern   Not on file  Social History Narrative   Not on file   Social Determinants of Health   Financial Resource Strain: Low Risk  (08/18/2022)   Overall Financial Resource Strain (CARDIA)    Difficulty of Paying Living Expenses: Not hard at all  Food Insecurity: No Food Insecurity (08/18/2022)   Hunger Vital Sign    Worried About Running Out of Food in the Last Year: Never true    Ran Out of Food in the Last Year: Never true  Transportation Needs: No Transportation Needs (08/18/2022)   PRAPARE - Administrator, Civil Service (Medical): No    Lack of Transportation (Non-Medical): No  Physical Activity: Inactive (08/18/2022)   Exercise Vital Sign    Days of Exercise per Week: 0 days    Minutes of Exercise per Session: 0 min  Stress: No Stress Concern Present (08/18/2022)   [employer] of Occupational Health - Occupational Stress Questionnaire    Feeling of Stress : Not at all  Social Connections: Moderately Integrated (08/18/2022)   Social Connection and Isolation Panel  [NHANES]    Frequency of Communication with Friends and Family: More than three times a week    Frequency of Social Gatherings with Friends and Family: More than three times a week    Attends Religious Services: More than 4 times per year    Active Member of [employer] or Organizations: No    Attends Banker Meetings: Never    Marital Status: Married  Catering manager Violence: Not At Risk (08/18/2022)   Humiliation,  Afraid, Rape, and Kick questionnaire    Fear of Current or Ex-Partner: No    Emotionally Abused: No    Physically Abused: No    Sexually Abused: No   Family History  Problem Relation Age of Onset   Cirrhosis Mother        drinking   Heart disease Sister    Other Brother        duodenal ulcer   Heart failure Maternal Grandmother    Gallbladder disease Maternal Grandmother    Stomach cancer Paternal Grandfather    Cancer Son 65       colon    Objective: Office vital signs reviewed. BP (!) 147/62   Pulse 90   Temp 98.8 F (37.1 C)   Ht 5\' 2"  (1.575 m)   Wt 145 lb (65.8 kg)   SpO2 94%   BMI 26.52 kg/m   Physical Examination:  General: Awake, alert, nontoxic-appearing female HEENT: Normal    Neck: No masses palpated. No lymphadenopathy    Ears: Tympanic membranes intact, normal light reflex, no erythema, no bulging    Eyes: PERRLA, extraocular membranes intact, sclera white    Nose: nasal turbinates moist, clear nasal discharge.  She does have have slight erythema of the nasal turbinates but no significant edema    Throat: moist mucus membranes, cobblestone appearance of the oropharynx without tonsillar exudates or enlargement.  Airway is patent Cardio: regular rate and rhythm, S1S2 heard, + murmurs appreciated Pulm: clear to auscultation bilaterally, no wheezes, rhonchi or rales; normal work of breathing on room air    Assessment/ Plan: 87 y.o. female   Rhinosinusitis - Plan: azelastine (ASTELIN) 0.1 % nasal spray, desloratadine (CLARINEX) 5 MG  tablet, guaiFENesin (MUCINEX) 600 MG 12 hr tablet  I question if this is perhaps postviral versus allergic in nature.  I am going to trial her on Astelin nasal spray as well as Clarinex and Mucinex for the next couple of weeks.  I will see her closely back if symptoms or not improving, we may need to consider alternative treatment.  Perhaps a steroid.  No orders of the defined types were placed in this encounter.  No orders of the defined types were placed in this encounter.    Raliegh Ip, DO [facility] [phone]

## 2022-11-10 ENCOUNTER — Ambulatory Visit: Payer: Medicare Other | Admitting: Family Medicine

## 2022-11-12 ENCOUNTER — Ambulatory Visit (INDEPENDENT_AMBULATORY_CARE_PROVIDER_SITE_OTHER): Payer: Medicare Other

## 2022-11-12 ENCOUNTER — Ambulatory Visit (INDEPENDENT_AMBULATORY_CARE_PROVIDER_SITE_OTHER): Payer: Medicare Other | Admitting: Family Medicine

## 2022-11-12 ENCOUNTER — Other Ambulatory Visit: Payer: Self-pay | Admitting: Family Medicine

## 2022-11-12 ENCOUNTER — Encounter: Payer: Self-pay | Admitting: Family Medicine

## 2022-11-12 DIAGNOSIS — S99921A Unspecified injury of right foot, initial encounter: Secondary | ICD-10-CM | POA: Diagnosis not present

## 2022-11-12 NOTE — Patient Instructions (Signed)
Voltaren Gel

## 2022-11-12 NOTE — Progress Notes (Signed)
Acute Office Visit  Subjective:  Patient ID: [name], female    DOB: 1935-04-29, 87 y.o.   MRN: [id number]  Chief Complaint  Patient presents with   right foot injury   HPI Patient is in today for right foot injury. Reports that the event happened yesterday. She was opening her door and pulled the door back on her toe. Believes that her toe bent backwards.  Reports that she cannot stand too long or pain radiates up her leg.  States that the swelling and bruising did not start right away, but started several hours later.  She was not able to sleep last night due to the pain. She tried to sleep on pillows. She put a homeopathic cream on it, which helped some.   ROS As per HPI  Objective:  BP (!) 137/52   Pulse 96   Temp 98.2 F (36.8 C)   Ht 5\' 2"  (1.575 m)   Wt 144 lb (65.3 kg)   SpO2 98%   BMI 26.34 kg/m   Physical Exam Constitutional:      Appearance: Normal appearance. She is well-developed and well-groomed. She is not ill-appearing, toxic-appearing or diaphoretic.  Cardiovascular:     Rate and Rhythm: Normal rate and regular rhythm.     Heart sounds: Murmur heard.  Pulmonary:     Effort: Pulmonary effort is normal. No tachypnea, bradypnea, accessory muscle usage or prolonged expiration.     Breath sounds: Normal breath sounds. No stridor, decreased air movement or transmitted upper airway sounds. No decreased breath sounds.  Musculoskeletal:     Right foot: Swelling present.     Comments: Great right toe with swelling, purple/red bruising  Skin:    General: Skin is warm.  Neurological:     General: No focal deficit present.     Mental Status: She is alert and oriented to person, place, and time. Mental status is at baseline.  Psychiatric:        Attention and Perception: Attention and perception normal.        Mood and Affect: Mood and affect normal.        Speech: Speech normal.        Behavior: Behavior normal. Behavior is cooperative.        Thought  Content: Thought content normal.        Cognition and Memory: Cognition and memory normal.        Judgment: Judgment normal.        11/12/2022   11:05 AM 10/27/2022    2:56 PM 10/13/2022   10:49 AM  Depression screen PHQ 2/9  Decreased Interest 0 0 0  Down, Depressed, Hopeless 0 0 0  PHQ - 2 Score 0 0 0  Altered sleeping 1 0   Tired, decreased energy 0 0   Change in appetite 0 0   Feeling bad or failure about yourself  0 0   Trouble concentrating 0 0   Moving slowly or fidgety/restless 0 0   Suicidal thoughts 0 0   PHQ-9 Score 1 0   Difficult doing work/chores  Not difficult at all       11/12/2022   11:05 AM 10/13/2022   10:49 AM 01/07/2022    1:19 PM 12/02/2021    3:26 PM  GAD 7 : Generalized Anxiety Score  Nervous, Anxious, on Edge 0 0 0 0  Control/stop worrying 0 0 0 0  Worry too much - different things 0 1 0 0  Trouble  relaxing 1 0 0 0  Restless 0 0 0 0  Easily annoyed or irritable 0 2 1 0  Afraid - awful might happen 0 0 0 0  Total GAD 7 Score 1 3 1  0  Anxiety Difficulty  Very difficult Not difficult at all Not difficult at all   Assessment & Plan:  1. Right foot injury, initial encounter Xray imaging obtained. Stat read without fracture present. Provided boot for patient. Discussed pain management options. Patient declined PO pain management. Provided written instructions for topical.   DG Foot Complete Right  11/12/22 IMPRESSION: 1. No acute fracture or dislocation. 2. If persistent concern for Lisfranc injury, recommend dedicated bilateral weight-bearing views.  The above assessment and management plan was discussed with the patient. The patient verbalized understanding of and has agreed to the management plan using shared-decision making. Patient is aware to call the clinic if they develop any new symptoms or if symptoms fail to improve or worsen. Patient is aware when to return to the clinic for a follow-up visit. Patient educated on when it is appropriate to go to  the emergency department.   Return in about 2 weeks (around 11/26/2022).  Neale Burly, DNP-FNP [facility] [address] [place], [place] [address] [phone]

## 2022-11-12 NOTE — Progress Notes (Signed)
Reviewed with patient in office;

## 2022-11-23 ENCOUNTER — Ambulatory Visit (INDEPENDENT_AMBULATORY_CARE_PROVIDER_SITE_OTHER): Payer: Medicare Other

## 2022-11-23 ENCOUNTER — Ambulatory Visit: Payer: Medicare Other | Admitting: Family Medicine

## 2022-11-23 ENCOUNTER — Encounter: Payer: Self-pay | Admitting: Family Medicine

## 2022-11-23 VITALS — BP 136/59 | HR 87 | Temp 97.8°F | Resp 20 | Ht 62.0 in | Wt 145.1 lb

## 2022-11-23 DIAGNOSIS — S99921D Unspecified injury of right foot, subsequent encounter: Secondary | ICD-10-CM

## 2022-11-23 DIAGNOSIS — M79674 Pain in right toe(s): Secondary | ICD-10-CM | POA: Diagnosis not present

## 2022-11-23 DIAGNOSIS — R0781 Pleurodynia: Secondary | ICD-10-CM

## 2022-11-23 DIAGNOSIS — R7989 Other specified abnormal findings of blood chemistry: Secondary | ICD-10-CM | POA: Diagnosis not present

## 2022-11-23 DIAGNOSIS — Z471 Aftercare following joint replacement surgery: Secondary | ICD-10-CM | POA: Diagnosis not present

## 2022-11-23 LAB — CBC WITH DIFFERENTIAL/PLATELET
Basophils Absolute: 0.1 10*3/uL (ref 0.0–0.2)
Basos: 1 %
EOS (ABSOLUTE): 0.3 10*3/uL (ref 0.0–0.4)
Eos: 4 %
Hematocrit: 36.1 % (ref 34.0–46.6)
Hemoglobin: 12.2 g/dL (ref 11.1–15.9)
Lymphocytes Absolute: 1.5 10*3/uL (ref 0.7–3.1)
Lymphs: 23 %
MCH: 30.4 pg (ref 26.6–33.0)
MCHC: 33.8 g/dL (ref 31.5–35.7)
MCV: 90 fL (ref 79–97)
Monocytes Absolute: 0.4 10*3/uL (ref 0.1–0.9)
Monocytes: 6 %
Neutrophils Absolute: 4.3 10*3/uL (ref 1.4–7.0)
Neutrophils: 66 %
Platelets: 203 10*3/uL (ref 150–450)
RBC: 4.01 x10E6/uL (ref 3.77–5.28)
RDW: 13 % (ref 11.7–15.4)
WBC: 6.6 10*3/uL (ref 3.4–10.8)

## 2022-11-23 LAB — D-DIMER, QUANTITATIVE: D-DIMER: 1.82 mg/L FEU — ABNORMAL HIGH (ref 0.00–0.49)

## 2022-11-23 MED ORDER — RIVAROXABAN 2.5 MG PO TABS
15.0000 mg | ORAL_TABLET | Freq: Two times a day (BID) | ORAL | Status: AC
Start: 2022-11-23 — End: ?

## 2022-11-23 NOTE — Progress Notes (Signed)
Subjective: CC: Follow-up toe pain PCP: Raliegh Ip, DO HPI:[name] is a 87 y.o. female presenting to clinic today for:  1.  Great toe pain Patient reports overall improvement in great toe pain but she still has some discomfort at the tip of the toe.  She has been utilizing her postop shoe which has been helping.  2.  Chest pain Today she complains of a right sided lung pain.  She feels like there is a balloon in her right lung.  Denies overt shortness of breath reports some nausea and a headache.  She reports no hemoptysis.  No cough.  No swelling of the calves.  She worries about blood clot.   ROS: Per HPI  Allergies  Allergen Reactions   Tape Other (See Comments)    Dermatitis rash "with extended exposure"   Prolia [Denosumab] Other (See Comments)    Arthralgia/ myalgia/ jaw pain/ headache   Past Medical History:  Diagnosis Date   Chronic headaches    Fibromyalgia    GERD (gastroesophageal reflux disease)    Hemorrhoids    INTERNAL--  POST BANDING 02/ 2015   History of kidney stones    Hyperlipidemia    Hypertension    Moderate aortic stenosis    NSAID long-term use 05/30/2013   OSA (obstructive sleep apnea) CPAP INTOLERANT    Osteoporosis    S/P TAVR (transcatheter aortic valve replacement) 10/19/2017   20 mm Edwards Sapien 3 transcatheter heart valve placed via percutaneous right transfemoral approach    Severe aortic stenosis    Spinal stenosis, multilevel     Current Outpatient Medications:    acetaminophen (TYLENOL) 500 MG tablet, Take 1,000 mg by mouth daily as needed for moderate pain or headache., Disp: , Rfl:    ALOE VERA PO, Take 1 capsule by mouth daily., Disp: , Rfl:    amLODipine (NORVASC) 2.5 MG tablet, Take 1 tablet (2.5 mg total) by mouth daily., Disp: 90 tablet, Rfl: 1   amoxicillin-clavulanate (AUGMENTIN) 875-125 MG tablet, Take 1 tablet by mouth 2 (two) times daily. Take all of this medication, Disp: 20 tablet, Rfl: 0   Ascorbic Acid  (VITAMIN C) 1000 MG tablet, Take 1,000 mg by mouth daily., Disp: , Rfl:    aspirin EC 81 MG tablet, Take 1 tablet (81 mg total) by mouth daily., Disp: 90 tablet, Rfl: 3   azelastine (ASTELIN) 0.1 % nasal spray, Place 1 spray into both nostrils 2 (two) times daily as needed for rhinitis or allergies (runny nose)., Disp: 30 mL, Rfl: PRN   BEE POLLEN PO, Take 400 mg by mouth daily., Disp: , Rfl:    benzonatate (TESSALON) 200 MG capsule, Take 1 capsule (200 mg total) by mouth 3 (three) times daily as needed., Disp: 30 capsule, Rfl: 1   clobetasol cream (TEMOVATE) 0.05 %, APPLY 1 APPLICATION TOPICALLY 2 (TWO) TIMES DAILY AS NEEDED., Disp: 30 g, Rfl: 3   Coenzyme Q10 (COQ-10) 100 MG CAPS, Take 100 mg by mouth daily., Disp: , Rfl:    desloratadine (CLARINEX) 5 MG tablet, Take 1 tablet (5 mg total) by mouth daily as needed (runny nose/ allergies/ itching)., Disp: 90 tablet, Rfl: PRN   furosemide (LASIX) 20 MG tablet, Take 20 mg by mouth as needed., Disp: , Rfl:    Glucosamine-Chondroit-Vit C-Mn (GLUCOSAMINE 1500 COMPLEX PO), Take by mouth., Disp: , Rfl:    guaiFENesin (MUCINEX) 600 MG 12 hr tablet, Take 1 tablet (600 mg total) by mouth 2 (two) times daily  as needed for to loosen phlegm., Disp: 20 tablet, Rfl: 0   hydrALAZINE (APRESOLINE) 25 MG tablet, Take 1 tablet (25 mg total) by mouth in the morning and at bedtime., Disp: 270 tablet, Rfl: 3   Magnesium 500 MG CAPS, Take by mouth., Disp: , Rfl:    metoprolol tartrate (LOPRESSOR) 50 MG tablet, Take 50 mg by mouth as needed., Disp: , Rfl:    Misc Natural Products (TART CHERRY ADVANCED PO), Take by mouth daily., Disp: , Rfl:    mupirocin ointment (BACTROBAN) 2 %, Apply to the affected area twice daily for 7-10 days, Disp: 22 g, Rfl: 0   olmesartan-hydrochlorothiazide (BENICAR HCT) 20-12.5 MG tablet, Take 1 tablet by mouth daily., Disp: 30 tablet, Rfl: 6   OVER THE COUNTER MEDICATION, Take 1 capsule by mouth 2 (two) times daily. Hemp Oil, Disp: , Rfl:     rivaroxaban (XARELTO) 2.5 MG TABS tablet, Take 6 tablets (15 mg total) by mouth 2 (two) times daily., Disp: , Rfl:    rosuvastatin (CRESTOR) 10 MG tablet, Take 1 tablet (10 mg total) by mouth daily., Disp: 90 tablet, Rfl: 3   UNABLE TO FIND, Med Name: HEMP- Tumeric cream / RUB, Disp: , Rfl:  Social History   Socioeconomic History   Marital status: Married    Spouse name: Krithika Romaniello   Number of children: 6   Years of education: 6   Highest education level: 8th grade  Occupational History   Occupation: Retired  Tobacco Use   Smoking status: Former    Current packs/day: 0.00    Average packs/day: 0.3 packs/day for 5.0 years (1.3 ttl pk-yrs)    Types: Cigarettes    Start date: 04/13/1998    Quit date: 04/14/2003    Years since quitting: 19.6    Passive exposure: Past   Smokeless tobacco: Never  Vaping Use   Vaping status: Never Used  Substance and Sexual Activity   Alcohol use: Yes    Comment: once in a while-social   Drug use: Never    Comment: hemp oil    Sexual activity: Not Currently    Partners: Male  Other Topics Concern   Not on file  Social History Narrative   Not on file   Social Determinants of Health   Financial Resource Strain: Low Risk  (11/22/2022)   Overall Financial Resource Strain (CARDIA)    Difficulty of Paying Living Expenses: Not hard at all  Food Insecurity: No Food Insecurity (11/22/2022)   Hunger Vital Sign    Worried About Running Out of Food in the Last Year: Never true    Ran Out of Food in the Last Year: Never true  Transportation Needs: No Transportation Needs (11/22/2022)   PRAPARE - Administrator, Civil Service (Medical): No    Lack of Transportation (Non-Medical): No  Physical Activity: Inactive (11/22/2022)   Exercise Vital Sign    Days of Exercise per Week: 0 days    Minutes of Exercise per Session: 0 min  Stress: No Stress Concern Present (11/22/2022)   [employer] of Occupational Health - Occupational Stress  Questionnaire    Feeling of Stress : Not at all  Social Connections: Moderately Integrated (11/22/2022)   Social Connection and Isolation Panel [NHANES]    Frequency of Communication with Friends and Family: More than three times a week    Frequency of Social Gatherings with Friends and Family: More than three times a week    Attends Religious Services: 1 to  4 times per year    Active Member of Clubs or Organizations: No    Attends Banker Meetings: Never    Marital Status: Married  Catering manager Violence: Not At Risk (08/18/2022)   Humiliation, Afraid, Rape, and Kick questionnaire    Fear of Current or Ex-Partner: No    Emotionally Abused: No    Physically Abused: No    Sexually Abused: No   Family History  Problem Relation Age of Onset   Cirrhosis Mother        drinking   Heart disease Sister    Other Brother        duodenal ulcer   Heart failure Maternal Grandmother    Gallbladder disease Maternal Grandmother    Stomach cancer Paternal Grandfather    Cancer Son 42       colon    Objective: Office vital signs reviewed. BP (!) 136/59   Pulse 87   Temp 97.8 F (36.6 C) (Oral)   Resp 20   Ht 5\' 2"  (1.575 m)   Wt 145 lb 2 oz (65.8 kg)   SpO2 99%   BMI 26.54 kg/m   Physical Examination:  General: Awake, alert, well nourished, No acute distress HEENT: sclera white, MMM Cardio: regular rate and rhythm, S1S2 heard, systolic murmurs appreciated Pulm: Mild reduction in breath sounds throughout but otherwise clear to auscultation bilaterally, no wheezes, rhonchi or rales; normal work of breathing on room air MSK: Mild tenderness to palpation to the tip of the right great toe.  She has healing ecchymosis noted along the forefoot but no tenderness along the base of the great toe or forefoot anymore.  DG Toe Great Right  Result Date: 11/23/2022 CLINICAL DATA:  ongoing pain at right tip great toe EXAM: RIGHT GREAT TOE COMPARISON:  None Available. FINDINGS: Normal  alignment. No acute fracture. The soft tissues are unremarkable. IMPRESSION: No acute osseous abnormality. Electronically Signed   By: Olive Bass M.D.   On: 11/23/2022 11:37   DG Chest 2 View  Result Date: 11/23/2022 CLINICAL DATA:  pleuritic chest pain EXAM: CHEST - 2 VIEW COMPARISON:  June 2024 FINDINGS: The cardiomediastinal silhouette is within normal limits. Status post heart valve repair/replacement. No pleural effusion. No pneumothorax. No mass or consolidation. No acute osseous abnormality. IMPRESSION: No acute findings in the chest. Electronically Signed   By: Olive Bass M.D.   On: 11/23/2022 11:37     Results for orders placed or performed in visit on 11/23/22 (from the past 24 hour(s))  D-dimer, quantitative     Status: Abnormal   Collection Time: 11/23/22 10:52 AM  Result Value Ref Range   D-DIMER 1.82 (H) 0.00 - 0.49 mg/L FEU   Narrative   Performed at:  01 - [facility] [address]  [address], [place], [place]  [id number] Lab Director: Mila Homer MD, Phone:  [phone]  CBC with Differential     Status: None   Collection Time: 11/23/22 10:52 AM  Result Value Ref Range   WBC 6.6 3.4 - 10.8 x10E3/uL   RBC 4.01 3.77 - 5.28 x10E6/uL   Hemoglobin 12.2 11.1 - 15.9 g/dL   Hematocrit 44.0 34.7 - 46.6 %   MCV 90 79 - 97 fL   MCH 30.4 26.6 - 33.0 pg   MCHC 33.8 31.5 - 35.7 g/dL   RDW 42.5 95.6 - 38.7 %   Platelets 203 150 - 450 x10E3/uL   Neutrophils 66 Not Estab. %  Lymphs 23 Not Estab. %   Monocytes 6 Not Estab. %   Eos 4 Not Estab. %   Basos 1 Not Estab. %   Neutrophils Absolute 4.3 1.4 - 7.0 x10E3/uL   Lymphocytes Absolute 1.5 0.7 - 3.1 x10E3/uL   Monocytes Absolute 0.4 0.1 - 0.9 x10E3/uL   EOS (ABSOLUTE) 0.3 0.0 - 0.4 x10E3/uL   Basophils Absolute 0.1 0.0 - 0.2 x10E3/uL   Narrative   Performed at:  01 - [facility] [address]  [address], [place], [place]  [id number] Lab Director: Mila Homer MD, Phone:  [phone]     Assessment/ Plan: 87 y.o. female   Pleuritic chest pain - Plan: D-dimer, quantitative, CBC with Differential, DG Chest 2 View  Right foot injury, subsequent encounter - Plan: DG Toe Great Right  Positive D-dimer - Plan: CT Angio Chest Pulmonary Embolism (PE) W or WO Contrast, rivaroxaban (XARELTO) 2.5 MG TABS tablet  Cannot rule out pulmonary embolism.  D-dimer was high at 1.82.  This is over 2 times the upper limit of acceptable range for this 87 year old female.  Unfortunately we cannot secure a CT of her lungs until tomorrow morning so I am going to empirically treat her with Xarelto 15 mg twice daily for the next 24 hours.  Her daughter is to come and pick up this medication as we did not receive the D-dimer until after her visit was completed  With regards to her right toe, no evidence of fracture.  Suspect bony contusion and hopefully we will see healing of this as we have seen with her forefoot over the last couple of weeks.   Raliegh Ip, DO [facility] [phone]

## 2022-11-23 NOTE — Patient Instructions (Signed)
I am giving you a sample of Xarelto.  The dose for a clot is 15mg  TWICE daily.  Since I only have 2.5mg  samples, you will take SIX (6) tablets as a SINGLE dose today = 15mg  REPEAT the dose tomorrow morning.   Hopefully, we can get a fast read on the scan and I can let you know if we will be continuing this medication or not. I didn't feel comfortable not treating you while we wait for you to be scanned.

## 2022-11-24 ENCOUNTER — Ambulatory Visit (HOSPITAL_COMMUNITY)
Admission: RE | Admit: 2022-11-24 | Discharge: 2022-11-24 | Disposition: A | Discharge status: Home / Self Care | Payer: Medicare Other | Source: Ambulatory Visit | Attending: Family Medicine | Admitting: Family Medicine

## 2022-11-24 ENCOUNTER — Other Ambulatory Visit: Payer: Self-pay | Admitting: Family Medicine

## 2022-11-24 ENCOUNTER — Telehealth: Payer: Self-pay | Admitting: Family Medicine

## 2022-11-24 DIAGNOSIS — N281 Cyst of kidney, acquired: Secondary | ICD-10-CM | POA: Diagnosis not present

## 2022-11-24 DIAGNOSIS — R7989 Other specified abnormal findings of blood chemistry: Secondary | ICD-10-CM | POA: Diagnosis not present

## 2022-11-24 DIAGNOSIS — R918 Other nonspecific abnormal finding of lung field: Secondary | ICD-10-CM

## 2022-11-24 DIAGNOSIS — I700 Atherosclerosis of aorta: Secondary | ICD-10-CM | POA: Diagnosis not present

## 2022-11-24 MED ORDER — IOHEXOL 350 MG/ML SOLN
75.0000 mL | Freq: Once | INTRAVENOUS | Status: AC | PRN
  Administered 2022-11-24: 75 mL via INTRAVENOUS

## 2022-11-24 NOTE — Telephone Encounter (Signed)
Pts daughter called back to let PCP know that pts covid test result is negative. Also needs appt with PCP asap to get pt some help.

## 2022-11-24 NOTE — Telephone Encounter (Signed)
Called and spoke with pt - she voiced understanding her family is on the way back with a covid test - states she will call back once she test

## 2022-11-24 NOTE — Telephone Encounter (Signed)
Please schedule pt with DOD if she needs to be seen in person. Again, I'm not sure what can be done besides putting her on pain medication, which I suspect she will decline as she doesn't like that type of medication.  Perhaps physical therapy?

## 2022-11-24 NOTE — Telephone Encounter (Signed)
I have talked to the patient about this already.  I recommended covid testing as she was now having new symptoms that were concerning for viral illness. I've also placed a referral to the lung specialist for pt and also already ordered follow up CT.  The pain is UNLIKELY to be related to the nodule.  I think the nodule findings were incidental but should be followed up.  Recommend salonpas patches, tylenol, heat and rest as there was nothing on her CT scan to explain any symptoms she is having.

## 2022-11-25 ENCOUNTER — Encounter: Payer: Self-pay | Admitting: Family Medicine

## 2022-11-25 ENCOUNTER — Ambulatory Visit: Payer: Medicare Other | Admitting: Family Medicine

## 2022-11-25 ENCOUNTER — Telehealth: Payer: Medicare Other

## 2022-11-25 ENCOUNTER — Ambulatory Visit (INDEPENDENT_AMBULATORY_CARE_PROVIDER_SITE_OTHER): Payer: Medicare Other | Admitting: Family Medicine

## 2022-11-25 VITALS — BP 118/50 | HR 92 | Temp 97.3°F | Ht 62.0 in | Wt 144.0 lb

## 2022-11-25 DIAGNOSIS — R0781 Pleurodynia: Secondary | ICD-10-CM | POA: Diagnosis not present

## 2022-11-25 DIAGNOSIS — M549 Dorsalgia, unspecified: Secondary | ICD-10-CM | POA: Diagnosis not present

## 2022-11-25 MED ORDER — PREDNISONE 20 MG PO TABS: ORAL_TABLET | ORAL | 0 refills | Status: DC

## 2022-11-25 MED ORDER — CHLORPROMAZINE HCL 10 MG PO TABS: 10.0000 mg | ORAL_TABLET | Freq: Three times a day (TID) | ORAL | 2 refills | Status: DC | PRN

## 2022-11-25 MED ORDER — CYCLOBENZAPRINE HCL 10 MG PO TABS: 10.0000 mg | ORAL_TABLET | Freq: Every day | ORAL | 2 refills | Status: DC

## 2022-11-25 NOTE — Telephone Encounter (Signed)
Appt scheduled with [facility] this afternoon at 2:55. Pt made aware.  Original schedule with DOD today but DOD virtual today only

## 2022-11-25 NOTE — Progress Notes (Signed)
Subjective:  Patient ID: [name], female    DOB: Aug 13, 1935  Age: 87 y.o. MRN: [id number]  CC: No chief complaint on file.   HPI [name] presents for Still having pain in the lungs. Increasing for a week. Feels like someone is blowing up a balloon in her lungs. . Tired, cold. CTPA yesterday was negative for PE. This AM, feeling good at first. Now tired. A little sensation in lung now. Not as bad Points to right side of back. Thoracic region described as pressure. Very disturbed and convinced the lung nodule is the cause of her pain. Having frequent hiccough - like gasping that is momentary.     11/25/2022    2:57 PM 11/23/2022   10:44 AM 11/12/2022   11:05 AM  Depression screen PHQ 2/9  Decreased Interest 0 0 0  Down, Depressed, Hopeless 0 0 0  PHQ - 2 Score 0 0 0  Altered sleeping  0 1  Tired, decreased energy  0 0  Change in appetite  0 0  Feeling bad or failure about yourself   0 0  Trouble concentrating  0 0  Moving slowly or fidgety/restless  0 0  Suicidal thoughts  0 0  PHQ-9 Score  0 1    History [name] has a past medical history of Chronic headaches, Fibromyalgia, GERD (gastroesophageal reflux disease), Hemorrhoids, History of kidney stones, Hyperlipidemia, Hypertension, Moderate aortic stenosis, NSAID long-term use (05/30/2013), OSA (obstructive sleep apnea) (CPAP INTOLERANT ), Osteoporosis, S/P TAVR (transcatheter aortic valve replacement) (10/19/2017), Severe aortic stenosis, and Spinal stenosis, multilevel.   She has a past surgical history that includes Umbilical hernia repair [name] 2006); Vaginal hysterectomy (01-31-2001); Anterior cervical decomp/discectomy fusion (11-11-1999); Thyroidectomy (AGE 84); Hemorrhoidectomy with hemorrhoid banding (08-07-2010); Laparoscopic cholecystectomy (1990); Appendectomy (AGE 7); Tonsillectomy and adenoidectomy (AGE 83); Cataract extraction w/ intraocular lens  implant, bilateral (2012); transthoracic echocardiogram (last one  04-20-2013  [facility]); Cardiac catheterization (2008  [facility]); Shoulder arthroscopy with open rotator cuff repair and distal clavicle acrominectomy (Right, 05/25/2012); Flexible sigmoidoscopy (N/A, 06/01/2013); Tenosynovectomy (Left, 01/04/2014); RIGHT/LEFT HEART CATH AND CORONARY ANGIOGRAPHY (N/A, 09/29/2017); Transcatheter aortic valve replacement, transfemoral (10/19/2017); Transcatheter aortic valve replacement, transfemoral (Bilateral, 10/19/2017); TEE without cardioversion (Bilateral, 10/19/2017); Eye surgery; Cataract extraction (Bilateral); IR US Guide Vasc Access Right (06/27/2019); and IR RADIOLOGY PERIPHERAL GUIDED IV START (06/27/2019).   Her family history includes Cancer (age of onset: 69) in her son; Cirrhosis in her mother; Gallbladder disease in her maternal grandmother; Heart disease in her sister; Heart failure in her maternal grandmother; Other in her brother; Stomach cancer in her paternal grandfather.She reports that she quit smoking about 19 years ago. Her smoking use included cigarettes. She started smoking about 24 years ago. She has a 1.3 pack-year smoking history. She has been exposed to tobacco smoke. She has never used smokeless tobacco. She reports current alcohol use. She reports that she does not use drugs.    ROS Review of Systems  Constitutional: Negative.   HENT: Negative.    Eyes:  Negative for visual disturbance.  Respiratory:  Negative for shortness of breath.   Cardiovascular:  Negative for chest pain.  Gastrointestinal:  Negative for abdominal pain.  Musculoskeletal:  Negative for arthralgias.    Objective:  BP (!) 118/50   Pulse 92   Temp (!) 97.3 F (36.3 C)   Ht 5\' 2"  (1.575 m)   Wt 144 lb (65.3 kg)   SpO2 98%   BMI 26.34 kg/m  BP Readings from Last 3 Encounters:  11/25/22 (!) 118/50  11/23/22 (!) 136/59  11/12/22 (!) 137/52    Wt Readings from Last 3 Encounters:  11/25/22 144 lb (65.3 kg)  11/23/22 145 lb 2 oz (65.8 kg)  11/12/22 144 lb  (65.3 kg)     Physical Exam Constitutional:      General: She is not in acute distress.    Appearance: She is well-developed.  Cardiovascular:     Rate and Rhythm: Normal rate and regular rhythm.  Pulmonary:     Breath sounds: Normal breath sounds.  Musculoskeletal:        General: Tenderness (right paraspinous musculature in the mid to lower thoracic region has palpable spasm at the area where she refers to her lungs hurting.) present. Normal range of motion.  Skin:    General: Skin is warm and dry.  Neurological:     Mental Status: She is alert and oriented to person, place, and time.       Assessment & Plan:   Diagnoses and all orders for this visit:  Pleuritic chest pain  Trigger point with back pain  Other orders -     chlorproMAZINE (THORAZINE) 10 MG tablet; Take 1 tablet (10 mg total) by mouth 3 (three) times daily as needed for hiccoughs. -     cyclobenzaprine (FLEXERIL) 10 MG tablet; Take 1 tablet (10 mg total) by mouth at bedtime. To relax muscles -     Discontinue: predniSONE (DELTASONE) 20 MG tablet; One twice daily with food for 2 weeks. Then one daily for 2 weeks -     predniSONE (DELTASONE) 20 MG tablet; One twice daily with food for 2 weeks. Then one daily for 2 weeks. Take all of these. For inflammation of lungs and back muscles       I have discontinued [name]'s benzonatate, amoxicillin-clavulanate, azelastine, desloratadine, guaiFENesin, and rivaroxaban. I have also changed her predniSONE. Additionally, I am having her start on chlorproMAZINE and cyclobenzaprine. Lastly, I am having her maintain her BEE POLLEN PO, OVER THE COUNTER MEDICATION, ALOE VERA PO, CoQ-10, acetaminophen, aspirin EC, vitamin C, clobetasol cream, mupirocin ointment, UNABLE TO FIND, Misc Natural Products (TART CHERRY ADVANCED PO), Magnesium, Glucosamine-Chondroit-Vit C-Mn (GLUCOSAMINE 1500 COMPLEX PO), furosemide, metoprolol tartrate, hydrALAZINE, olmesartan-hydrochlorothiazide,  rosuvastatin, and amLODipine.  Allergies as of 11/25/2022       Reactions   Tape Other (See Comments)   Dermatitis rash "with extended exposure"   Prolia [denosumab] Other (See Comments)   Arthralgia/ myalgia/ jaw pain/ headache        Medication List        Accurate as of November 25, 2022  5:23 PM. If you have any questions, ask your nurse or doctor.          STOP taking these medications    amoxicillin-clavulanate 875-125 MG tablet Commonly known as: AUGMENTIN Stopped by:     azelastine 0.1 % nasal spray Commonly known as: ASTELIN Stopped by:     benzonatate 200 MG capsule Commonly known as: TESSALON Stopped by:     desloratadine 5 MG tablet Commonly known as: Clarinex Stopped by:     guaiFENesin 600 MG 12 hr tablet Commonly known as: Mucinex Stopped by:     rivaroxaban 2.5 MG Tabs tablet Commonly known as: XARELTO Stopped by:         TAKE these medications    acetaminophen 500 MG tablet Commonly known as: TYLENOL Take 1,000 mg by mouth daily as needed for  moderate pain or headache.   ALOE VERA PO Take 1 capsule by mouth daily.   amLODipine 2.5 MG tablet Commonly known as: NORVASC Take 1 tablet (2.5 mg total) by mouth daily.   aspirin EC 81 MG tablet Take 1 tablet (81 mg total) by mouth daily.   BEE POLLEN PO Take 400 mg by mouth daily.   chlorproMAZINE 10 MG tablet Commonly known as: THORAZINE Take 1 tablet (10 mg total) by mouth 3 (three) times daily as needed for hiccoughs. Started by:     clobetasol cream 0.05 % Commonly known as: TEMOVATE APPLY 1 APPLICATION TOPICALLY 2 (TWO) TIMES DAILY AS NEEDED.   CoQ-10 100 MG Caps Take 100 mg by mouth daily.   cyclobenzaprine 10 MG tablet Commonly known as: FLEXERIL Take 1 tablet (10 mg total) by mouth at bedtime. To relax muscles Started by:     furosemide 20 MG tablet Commonly known as:  LASIX Take 20 mg by mouth as needed.   GLUCOSAMINE 1500 COMPLEX PO Take by mouth.   hydrALAZINE 25 MG tablet Commonly known as: APRESOLINE Take 1 tablet (25 mg total) by mouth in the morning and at bedtime.   Magnesium 500 MG Caps Take by mouth.   metoprolol tartrate 50 MG tablet Commonly known as: LOPRESSOR Take 50 mg by mouth as needed.   mupirocin ointment 2 % Commonly known as: Bactroban Apply to the affected area twice daily for 7-10 days   olmesartan-hydrochlorothiazide 20-12.5 MG tablet Commonly known as: BENICAR HCT Take 1 tablet by mouth daily.   OVER THE COUNTER MEDICATION Take 1 capsule by mouth 2 (two) times daily. Hemp Oil   predniSONE 20 MG tablet Commonly known as: DELTASONE One twice daily with food for 2 weeks. Then one daily for 2 weeks. Take all of these. For inflammation of lungs and back muscles Started by:     rosuvastatin 10 MG tablet Commonly known as: CRESTOR Take 1 tablet (10 mg total) by mouth daily.   TART CHERRY ADVANCED PO Take by mouth daily.   UNABLE TO FIND Med Name: HEMP- Tumeric cream / RUB   vitamin C 1000 MG tablet Take 1,000 mg by mouth daily.         Follow-up: Return if symptoms worsen or fail to improve.  Mechele Claude, M.D.

## 2022-11-26 ENCOUNTER — Other Ambulatory Visit (HOSPITAL_BASED_OUTPATIENT_CLINIC_OR_DEPARTMENT_OTHER): Payer: Self-pay | Admitting: Cardiovascular Disease

## 2022-11-26 DIAGNOSIS — I10 Essential (primary) hypertension: Secondary | ICD-10-CM

## 2022-12-04 ENCOUNTER — Telehealth: Payer: Self-pay | Admitting: Family Medicine

## 2022-12-04 NOTE — Telephone Encounter (Signed)
I spoke with Patient and made her aware that the result of the CT recommenced a CT F/U in 3-6 months- Patient was feeling "uneasy" and states she would like to speak with you a little more to "put her mind at ease".  I also sent Patient a MyChart message with the Pulmonology Office contact information so she could checked on her Appt scheduling status.

## 2022-12-07 NOTE — Telephone Encounter (Signed)
Spoke to pt. She is feeling good. Passing thanks to Dr Darlyn Read for the prednisone and the hiccough med.

## 2022-12-09 DIAGNOSIS — H6593 Unspecified nonsuppurative otitis media, bilateral: Secondary | ICD-10-CM | POA: Diagnosis not present

## 2022-12-16 ENCOUNTER — Ambulatory Visit: Payer: Medicare Other | Admitting: Orthopedic Surgery

## 2022-12-18 ENCOUNTER — Other Ambulatory Visit: Payer: Self-pay | Admitting: Cardiovascular Disease

## 2022-12-18 ENCOUNTER — Other Ambulatory Visit (HOSPITAL_BASED_OUTPATIENT_CLINIC_OR_DEPARTMENT_OTHER): Payer: Self-pay | Admitting: Cardiovascular Disease

## 2022-12-18 ENCOUNTER — Other Ambulatory Visit: Payer: Self-pay | Admitting: Family Medicine

## 2022-12-18 ENCOUNTER — Other Ambulatory Visit: Payer: Self-pay | Admitting: Family

## 2022-12-24 ENCOUNTER — Ambulatory Visit: Payer: Medicare Other | Admitting: Pulmonary Disease

## 2022-12-24 ENCOUNTER — Encounter: Payer: Self-pay | Admitting: Pulmonary Disease

## 2022-12-24 VITALS — BP 140/60 | HR 111 | Ht 62.0 in | Wt 150.6 lb

## 2022-12-24 DIAGNOSIS — Z87891 Personal history of nicotine dependence: Secondary | ICD-10-CM | POA: Diagnosis not present

## 2022-12-24 DIAGNOSIS — R911 Solitary pulmonary nodule: Secondary | ICD-10-CM | POA: Diagnosis not present

## 2022-12-24 NOTE — Progress Notes (Signed)
Synopsis: Referred in September 2024 for pulmonary nodule by Raliegh Ip, DO  Subjective:   PATIENT ID: [name] GENDER: female DOB: September 19, 1935, MRN: [id number]  Chief Complaint  Patient presents with   Consult    Consult on lung nodules.    This is a very pleasant 87 year old female, past medical history of hypertension, hyperlipidemia, TAVR.  She had CT imaging of the chest that was originally completed in 2019 that showed a small right lower lobe pulmonary nodule.  She had follow-up CT imaging that was complete recently by her primary care provider that also showed this nodule in the right lower lobe now measuring approximately 7 mm.  It was 3 mm 5 years ago had minimal change over time however it has shown some growth.  From respiratory standpoint patient is fine she did quit smoking many years ago she only smoked for approximately 3 to 5 years total per patient's daughter.  She herself has had no malignancies.      Past Medical History:  Diagnosis Date   Chronic headaches    Fibromyalgia    GERD (gastroesophageal reflux disease)    Hemorrhoids    INTERNAL--  POST BANDING 02/ 2015   History of kidney stones    Hyperlipidemia    Hypertension    Moderate aortic stenosis    NSAID long-term use 05/30/2013   OSA (obstructive sleep apnea) CPAP INTOLERANT    Osteoporosis    S/P TAVR (transcatheter aortic valve replacement) 10/19/2017   20 mm Edwards Sapien 3 transcatheter heart valve placed via percutaneous right transfemoral approach    Severe aortic stenosis    Spinal stenosis, multilevel      Family History  Problem Relation Age of Onset   Cirrhosis Mother        drinking   Heart disease Sister    Other Brother        duodenal ulcer   Heart failure Maternal Grandmother    Gallbladder disease Maternal Grandmother    Stomach cancer Paternal Grandfather    Cancer Son 54       colon     Past Surgical History:  Procedure Laterality Date   ANTERIOR  CERVICAL DECOMP/DISCECTOMY FUSION  11-11-1999   C5 - C6   APPENDECTOMY  AGE 51   CARDIAC CATHETERIZATION  2008  DR [facility]   ESSENTIALLY NORMAL   CATARACT EXTRACTION Bilateral    CATARACT EXTRACTION W/ INTRAOCULAR LENS  IMPLANT, BILATERAL  2012   EYE SURGERY     FLEXIBLE SIGMOIDOSCOPY N/A 06/01/2013   Procedure: FLEXIBLE SIGMOIDOSCOPY;  Surgeon: Louis Meckel, MD;  Location: [facility];  Service: Endoscopy;  Laterality: N/A;  may need hemorrhoidal banding   HEMORRHOIDECTOMY WITH HEMORRHOID BANDING  08-07-2010   IR RADIOLOGY PERIPHERAL GUIDED IV START  06/27/2019   IR US GUIDE VASC ACCESS RIGHT  06/27/2019   LAPAROSCOPIC CHOLECYSTECTOMY  1990   RIGHT/LEFT HEART CATH AND CORONARY ANGIOGRAPHY N/A 09/29/2017   Procedure: RIGHT/LEFT HEART CATH AND CORONARY ANGIOGRAPHY;  Surgeon: Tonny Bollman, MD;  Location: [facility] INVASIVE CV LAB;  Service: Cardiovascular;  Laterality: N/A;   SHOULDER ARTHROSCOPY WITH OPEN ROTATOR CUFF REPAIR AND DISTAL CLAVICLE ACROMINECTOMY Right 05/25/2012   Procedure: SHOULDER ARTHROSCOPY WITH OPEN ROTATOR CUFF REPAIR AND DISTAL CLAVICLE ACROMINECTOMY;  Surgeon: Drucilla Schmidt, MD;  Location: [facility];  Service: Orthopedics;  Laterality: Right;  RIGHT SHOULDER ARTHROSCOPY WITH DERBRIDEMENTOF LABRAL/BICEP TENDON, OPEN DISTAL CLAVICLE RESECTION, ANTERIOR ACROMINECTOMY ROTATOR CUFF REPAIR ANESTHESIA: GENERAL/SCALENE NERVE BLOCK  TEE WITHOUT CARDIOVERSION Bilateral 10/19/2017   Procedure: TRANSESOPHAGEAL ECHOCARDIOGRAM (TEE);  Surgeon: Tonny Bollman, MD;  Location: [facility] OR;  Service: Open Heart Surgery;  Laterality: Bilateral;   TENOSYNOVECTOMY Left 01/04/2014   Procedure: LEFT WRIST EXTENSOR TENOSYNOVECTOMY;  Surgeon: Sharma Covert, MD;  Location: [facility];  Service: Orthopedics;  Laterality: Left;   THYROIDECTOMY  AGE 48   GOITER   TONSILLECTOMY AND ADENOIDECTOMY  AGE 69   TRANSCATHETER AORTIC VALVE REPLACEMENT, TRANSFEMORAL  10/19/2017    TRANSCATHETER AORTIC VALVE REPLACEMENT, TRANSFEMORAL Bilateral 10/19/2017   Procedure: TRANSCATHETER AORTIC VALVE REPLACEMENT, TRANSFEMORAL;  Surgeon: Tonny Bollman, MD;  Location: [facility] OR;  Service: Open Heart Surgery;  Laterality: Bilateral;   TRANSTHORACIC ECHOCARDIOGRAM  last one 04-20-2013  DR [facility]    NORMAL LVSF/ EF 60-65%/ MODERATE  AV  STENOSIS WITH NO AR /  MILD LAE   UMBILICAL HERNIA REPAIR  JUNE 2006   VAGINAL HYSTERECTOMY  01-31-2001   ANTERIOR & POSTERIOR REPAIR/ TRANSVAGINAL BLADDER SLING    Social History   Socioeconomic History   Marital status: Married    Spouse name: Khamryn Overfelt   Number of children: 6   Years of education: 6   Highest education level: 8th grade  Occupational History   Occupation: Retired  Tobacco Use   Smoking status: Former    Current packs/day: 0.00    Average packs/day: 0.3 packs/day for 5.0 years (1.3 ttl pk-yrs)    Types: Cigarettes    Start date: 04/13/1998    Quit date: 04/14/2003    Years since quitting: 19.7    Passive exposure: Past   Smokeless tobacco: Never  Vaping Use   Vaping status: Never Used  Substance and Sexual Activity   Alcohol use: Yes    Comment: once in a while-social   Drug use: Never    Comment: hemp oil    Sexual activity: Not Currently    Partners: Male  Other Topics Concern   Not on file  Social History Narrative   Not on file   Social Determinants of Health   Financial Resource Strain: Low Risk  (11/22/2022)   Overall Financial Resource Strain (CARDIA)    Difficulty of Paying Living Expenses: Not hard at all  Food Insecurity: No Food Insecurity (11/22/2022)   Hunger Vital Sign    Worried About Running Out of Food in the Last Year: Never true    Ran Out of Food in the Last Year: Never true  Transportation Needs: No Transportation Needs (11/22/2022)   PRAPARE - Administrator, Civil Service (Medical): No    Lack of Transportation (Non-Medical): No  Physical Activity: Inactive (11/22/2022)    Exercise Vital Sign    Days of Exercise per Week: 0 days    Minutes of Exercise per Session: 0 min  Stress: No Stress Concern Present (11/22/2022)   [employer] of Occupational Health - Occupational Stress Questionnaire    Feeling of Stress : Not at all  Social Connections: Moderately Integrated (11/22/2022)   Social Connection and Isolation Panel [NHANES]    Frequency of Communication with Friends and Family: More than three times a week    Frequency of Social Gatherings with Friends and Family: More than three times a week    Attends Religious Services: 1 to 4 times per year    Active Member of [employer] or Organizations: No    Attends Banker Meetings: Never    Marital Status: Married  Catering manager Violence: Not At  Risk (08/18/2022)   Humiliation, Afraid, Rape, and Kick questionnaire    Fear of Current or Ex-Partner: No    Emotionally Abused: No    Physically Abused: No    Sexually Abused: No     Allergies  Allergen Reactions   Tape Other (See Comments)    Dermatitis rash "with extended exposure"   Prolia [Denosumab] Other (See Comments)    Arthralgia/ myalgia/ jaw pain/ headache     Outpatient Medications Prior to Visit  Medication Sig Dispense Refill   acetaminophen (TYLENOL) 500 MG tablet Take 1,000 mg by mouth daily as needed for moderate pain or headache.     ALOE VERA PO Take 1 capsule by mouth daily.     amLODipine (NORVASC) 2.5 MG tablet TAKE 1 TABLET BY MOUTH EVERY DAY 90 tablet 1   Ascorbic Acid (VITAMIN C) 1000 MG tablet Take 1,000 mg by mouth daily.     aspirin EC 81 MG tablet Take 1 tablet (81 mg total) by mouth daily. 90 tablet 3   BEE POLLEN PO Take 400 mg by mouth daily.     chlorproMAZINE (THORAZINE) 10 MG tablet Take 1 tablet (10 mg total) by mouth 3 (three) times daily as needed for hiccoughs. 30 tablet 2   clobetasol cream (TEMOVATE) 0.05 % APPLY 1 APPLICATION TOPICALLY 2 (TWO) TIMES DAILY AS NEEDED. 30 g 3   Coenzyme Q10 (COQ-10) 100 MG  CAPS Take 100 mg by mouth daily.     cyclobenzaprine (FLEXERIL) 10 MG tablet Take 1 tablet (10 mg total) by mouth at bedtime. To relax muscles 30 tablet 2   furosemide (LASIX) 20 MG tablet TAKE 1 TABLET BY MOUTH EVERY DAY 90 tablet 3   Glucosamine-Chondroit-Vit C-Mn (GLUCOSAMINE 1500 COMPLEX PO) Take by mouth.     hydrALAZINE (APRESOLINE) 25 MG tablet Take 1 tablet (25 mg total) by mouth in the morning and at bedtime. 270 tablet 3   Magnesium 500 MG CAPS Take by mouth.     metoprolol tartrate (LOPRESSOR) 50 MG tablet TAKE 1 TABLET BY MOUTH TWICE A DAY 180 tablet 3   Misc Natural Products (TART CHERRY ADVANCED PO) Take by mouth daily.     mupirocin ointment (BACTROBAN) 2 % Apply to the affected area twice daily for 7-10 days 22 g 0   olmesartan-hydrochlorothiazide (BENICAR HCT) 20-12.5 MG tablet TAKE 1 TABLET BY MOUTH EVERY DAY 90 tablet 1   OVER THE COUNTER MEDICATION Take 1 capsule by mouth 2 (two) times daily. Hemp Oil     promethazine-dextromethorphan (PROMETHAZINE-DM) 6.25-15 MG/5ML syrup TAKE 5 MILLILITERS BY MOUTH 3 TIMES A DAY AS NEEDED FOR COUGH 240 mL 0   rosuvastatin (CRESTOR) 10 MG tablet Take 1 tablet (10 mg total) by mouth daily. 90 tablet 3   UNABLE TO FIND Med Name: HEMP- Tumeric cream / RUB     predniSONE (DELTASONE) 20 MG tablet One twice daily with food for 2 weeks. Then one daily for 2 weeks. Take all of these. For inflammation of lungs and back muscles (Patient not taking: Reported on 12/24/2022) 42 tablet 0   No facility-administered medications prior to visit.    Review of Systems  Constitutional:  Negative for chills, fever, malaise/fatigue and weight loss.  HENT:  Negative for hearing loss, sore throat and tinnitus.   Eyes:  Negative for blurred vision and double vision.  Respiratory:  Positive for shortness of breath. Negative for cough, hemoptysis, sputum production, wheezing and stridor.   Cardiovascular:  Negative for chest pain,  palpitations, orthopnea, leg  swelling and PND.  Gastrointestinal:  Negative for abdominal pain, constipation, diarrhea, heartburn, nausea and vomiting.  Genitourinary:  Negative for dysuria, hematuria and urgency.  Musculoskeletal:  Negative for joint pain and myalgias.  Skin:  Negative for itching and rash.  Neurological:  Negative for dizziness, tingling, weakness and headaches.  Endo/Heme/Allergies:  Negative for environmental allergies. Does not bruise/bleed easily.  Psychiatric/Behavioral:  Negative for depression. The patient is not nervous/anxious and does not have insomnia.   All other systems reviewed and are negative.    Objective:  Physical Exam Vitals reviewed.  Constitutional:      General: She is not in acute distress.    Appearance: She is well-developed.  HENT:     Head: Normocephalic and atraumatic.  Eyes:     General: No scleral icterus.    Conjunctiva/sclera: Conjunctivae normal.     Pupils: Pupils are equal, round, and reactive to light.  Neck:     Vascular: No JVD.     Trachea: No tracheal deviation.  Cardiovascular:     Rate and Rhythm: Normal rate and regular rhythm.     Heart sounds: Normal heart sounds. No murmur heard. Pulmonary:     Effort: Pulmonary effort is normal. No tachypnea, accessory muscle usage or respiratory distress.     Breath sounds: No stridor. No wheezing, rhonchi or rales.  Abdominal:     General: There is no distension.     Palpations: Abdomen is soft.     Tenderness: There is no abdominal tenderness.  Musculoskeletal:        General: No tenderness.     Cervical back: Neck supple.  Lymphadenopathy:     Cervical: No cervical adenopathy.  Skin:    General: Skin is warm and dry.     Capillary Refill: Capillary refill takes less than 2 seconds.     Findings: No rash.  Neurological:     Mental Status: She is alert and oriented to person, place, and time.  Psychiatric:        Behavior: Behavior normal.      Vitals:   12/24/22 1335  BP: (!) 140/60   Pulse: (!) 111  SpO2: 96%  Weight: 150 lb 9.6 oz (68.3 kg)  Height: 5\' 2"  (1.575 m)   96% on RA BMI Readings from Last 3 Encounters:  12/24/22 27.55 kg/m  11/25/22 26.34 kg/m  11/23/22 26.54 kg/m   Wt Readings from Last 3 Encounters:  12/24/22 150 lb 9.6 oz (68.3 kg)  11/25/22 144 lb (65.3 kg)  11/23/22 145 lb 2 oz (65.8 kg)     CBC    Component Value Date/Time   WBC 6.6 11/23/2022 1052   WBC 8.0 05/08/2021 1957   RBC 4.01 11/23/2022 1052   RBC 3.92 05/08/2021 1957   HGB 12.2 11/23/2022 1052   HCT 36.1 11/23/2022 1052   PLT 203 11/23/2022 1052   MCV 90 11/23/2022 1052   MCH 30.4 11/23/2022 1052   MCH 30.6 05/08/2021 1957   MCHC 33.8 11/23/2022 1052   MCHC 32.6 05/08/2021 1957   RDW 13.0 11/23/2022 1052   LYMPHSABS 1.5 11/23/2022 1052   MONOABS 0.7 05/08/2021 1957   EOSABS 0.3 11/23/2022 1052   BASOSABS 0.1 11/23/2022 1052     Chest Imaging:   CT chest August 2024: Small subsolid 5 mm nodule in the right upper lobe as well as a 7 mm right lower lobe pulmonary nodule.  It has grown since 2019. The patient's images have  been independently reviewed by me.    Pulmonary Functions Testing Results:    Latest Ref Rng & Units 10/11/2017   11:54 AM  PFT Results  FVC-Pre L 2.23   FVC-Predicted Pre % 94   FVC-Post L 2.28   FVC-Predicted Post % 96   Pre FEV1/FVC % % 72   Post FEV1/FCV % % 80   FEV1-Pre L 1.60   FEV1-Predicted Pre % 91   FEV1-Post L 1.82   DLCO uncorrected ml/min/mmHg 16.37   DLCO UNC% % 75   DLVA Predicted % 90   TLC L 5.84   TLC % Predicted % 122   RV % Predicted % 149     FeNO:   Pathology:   Echocardiogram:   Heart Catheterization:     Assessment & Plan:     ICD-10-CM   1. Lung nodule  R91.1 CT CHEST HIGH RESOLUTION    2. Former smoker  Z87.891       Discussion:  This is a very pleasant 87 year old female, history of a former smoker only for a few years had incidental pulmonary nodule found in 2019 that has shown some  growth over the past 5 years now only measuring 7 mm in size.  Plan: Since it has shown change I do think she needs continued follow-up. At her age I think is reasonable just to continue conservative image. We will repeat noncontrast CT in 6 months. Also have her do inspiratory and expiratory prone and supine films to help ensure that we can see the base on the right side as clear as possible. Return to clinic in 6 months after HRCT complete.    Current Outpatient Medications:    acetaminophen (TYLENOL) 500 MG tablet, Take 1,000 mg by mouth daily as needed for moderate pain or headache., Disp: , Rfl:    ALOE VERA PO, Take 1 capsule by mouth daily., Disp: , Rfl:    amLODipine (NORVASC) 2.5 MG tablet, TAKE 1 TABLET BY MOUTH EVERY DAY, Disp: 90 tablet, Rfl: 1   Ascorbic Acid (VITAMIN C) 1000 MG tablet, Take 1,000 mg by mouth daily., Disp: , Rfl:    aspirin EC 81 MG tablet, Take 1 tablet (81 mg total) by mouth daily., Disp: 90 tablet, Rfl: 3   BEE POLLEN PO, Take 400 mg by mouth daily., Disp: , Rfl:    chlorproMAZINE (THORAZINE) 10 MG tablet, Take 1 tablet (10 mg total) by mouth 3 (three) times daily as needed for hiccoughs., Disp: 30 tablet, Rfl: 2   clobetasol cream (TEMOVATE) 0.05 %, APPLY 1 APPLICATION TOPICALLY 2 (TWO) TIMES DAILY AS NEEDED., Disp: 30 g, Rfl: 3   Coenzyme Q10 (COQ-10) 100 MG CAPS, Take 100 mg by mouth daily., Disp: , Rfl:    cyclobenzaprine (FLEXERIL) 10 MG tablet, Take 1 tablet (10 mg total) by mouth at bedtime. To relax muscles, Disp: 30 tablet, Rfl: 2   furosemide (LASIX) 20 MG tablet, TAKE 1 TABLET BY MOUTH EVERY DAY, Disp: 90 tablet, Rfl: 3   Glucosamine-Chondroit-Vit C-Mn (GLUCOSAMINE 1500 COMPLEX PO), Take by mouth., Disp: , Rfl:    hydrALAZINE (APRESOLINE) 25 MG tablet, Take 1 tablet (25 mg total) by mouth in the morning and at bedtime., Disp: 270 tablet, Rfl: 3   Magnesium 500 MG CAPS, Take by mouth., Disp: , Rfl:    metoprolol tartrate (LOPRESSOR) 50 MG tablet,  TAKE 1 TABLET BY MOUTH TWICE A DAY, Disp: 180 tablet, Rfl: 3   Misc Natural Products (TART CHERRY ADVANCED PO),  Take by mouth daily., Disp: , Rfl:    mupirocin ointment (BACTROBAN) 2 %, Apply to the affected area twice daily for 7-10 days, Disp: 22 g, Rfl: 0   olmesartan-hydrochlorothiazide (BENICAR HCT) 20-12.5 MG tablet, TAKE 1 TABLET BY MOUTH EVERY DAY, Disp: 90 tablet, Rfl: 1   OVER THE COUNTER MEDICATION, Take 1 capsule by mouth 2 (two) times daily. Hemp Oil, Disp: , Rfl:    promethazine-dextromethorphan (PROMETHAZINE-DM) 6.25-15 MG/5ML syrup, TAKE 5 MILLILITERS BY MOUTH 3 TIMES A DAY AS NEEDED FOR COUGH, Disp: 240 mL, Rfl: 0   rosuvastatin (CRESTOR) 10 MG tablet, Take 1 tablet (10 mg total) by mouth daily., Disp: 90 tablet, Rfl: 3   UNABLE TO FIND, Med Name: HEMP- Tumeric cream / RUB, Disp: , Rfl:    predniSONE (DELTASONE) 20 MG tablet, One twice daily with food for 2 weeks. Then one daily for 2 weeks. Take all of these. For inflammation of lungs and back muscles (Patient not taking: Reported on 12/24/2022), Disp: 42 tablet, Rfl: 0   Josephine Igo, DO [facility] 12/24/2022 3:19 PM

## 2022-12-25 ENCOUNTER — Other Ambulatory Visit: Payer: Self-pay | Admitting: Family Medicine

## 2022-12-28 ENCOUNTER — Ambulatory Visit (INDEPENDENT_AMBULATORY_CARE_PROVIDER_SITE_OTHER): Payer: Medicare Other | Admitting: Family Medicine

## 2022-12-28 ENCOUNTER — Encounter: Payer: Self-pay | Admitting: Family Medicine

## 2022-12-28 VITALS — BP 149/56 | HR 98 | Temp 98.3°F | Ht 62.0 in | Wt 149.6 lb

## 2022-12-28 DIAGNOSIS — M797 Fibromyalgia: Secondary | ICD-10-CM | POA: Diagnosis not present

## 2022-12-28 DIAGNOSIS — Z791 Long term (current) use of non-steroidal anti-inflammatories (NSAID): Secondary | ICD-10-CM | POA: Diagnosis not present

## 2022-12-28 MED ORDER — NABUMETONE 500 MG PO TABS: 1000.0000 mg | ORAL_TABLET | Freq: Two times a day (BID) | ORAL | 1 refills | Status: DC

## 2022-12-28 NOTE — Progress Notes (Signed)
Subjective:  Patient ID: [name], female    DOB: 02-03-1936  Age: 87 y.o. 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 Wants to stay on it. Now is pain free. Wants to stay ahead of the fibromyalgia pain.     12/28/2022    1:35 PM 12/28/2022    1:28 PM 11/25/2022    2:57 PM  Depression screen PHQ 2/9  Decreased Interest 0 0 0  Down, Depressed, Hopeless 0 0 0  PHQ - 2 Score 0 0 0  Altered sleeping 0    Tired, decreased energy 1    Change in appetite 0    Feeling bad or failure about yourself  0    Trouble concentrating 0    Moving slowly or fidgety/restless 3    Suicidal thoughts 0    PHQ-9 Score 4    Difficult doing work/chores Somewhat difficult      History Karrine has a past medical history of Chronic headaches, Fibromyalgia, GERD (gastroesophageal reflux disease), Hemorrhoids, History of kidney stones, Hyperlipidemia, Hypertension, Moderate aortic stenosis, NSAID long-term use (05/30/2013), OSA (obstructive sleep apnea) (CPAP INTOLERANT ), Osteoporosis, S/P TAVR (transcatheter aortic valve replacement) (10/19/2017), Severe aortic stenosis, and Spinal stenosis, multilevel.   She has a past surgical history that includes Umbilical hernia repair [name] 2006); Vaginal hysterectomy (01-31-2001); Anterior cervical decomp/discectomy fusion (11-11-1999); Thyroidectomy (AGE 70); Hemorrhoidectomy with hemorrhoid banding (08-07-2010); Laparoscopic cholecystectomy (1990); Appendectomy (AGE 52); Tonsillectomy and adenoidectomy (AGE 66); Cataract extraction w/ intraocular lens  implant, bilateral (2012); transthoracic echocardiogram (last one 04-20-2013  [facility]); Cardiac catheterization (2008  [facility]); Shoulder arthroscopy with open rotator cuff repair and distal clavicle acrominectomy (Right, 05/25/2012); Flexible sigmoidoscopy (N/A, 06/01/2013); Tenosynovectomy (Left, 01/04/2014); RIGHT/LEFT HEART CATH AND CORONARY ANGIOGRAPHY (N/A,  09/29/2017); Transcatheter aortic valve replacement, transfemoral (10/19/2017); Transcatheter aortic valve replacement, transfemoral (Bilateral, 10/19/2017); TEE without cardioversion (Bilateral, 10/19/2017); Eye surgery; Cataract extraction (Bilateral); IR US Guide Vasc Access Right (06/27/2019); and IR RADIOLOGY PERIPHERAL GUIDED IV START (06/27/2019).   Her family history includes Cancer (age of onset: 91) in her son; Cirrhosis in her mother; Gallbladder disease in her maternal grandmother; Heart disease in her sister; Heart failure in her maternal grandmother; Other in her brother; Stomach cancer in her paternal grandfather.She reports that she quit smoking about 19 years ago. Her smoking use included cigarettes. She started smoking about 24 years ago. She has a 1.3 pack-year smoking history. She has been exposed to tobacco smoke. She has never used smokeless tobacco. She reports current alcohol use. She reports that she does not use drugs.    ROS Review of Systems  Musculoskeletal:  Positive for arthralgias (multiple  - now quiescent).    Objective:  BP (!) 149/56   Pulse 98   Temp 98.3 F (36.8 C)   Ht 5\' 2"  (1.575 m)   Wt 149 lb 9.6 oz (67.9 kg)   SpO2 97%   BMI 27.36 kg/m   BP Readings from Last 3 Encounters:  12/28/22 (!) 149/56  12/24/22 (!) 140/60  11/25/22 (!) 118/50    Wt Readings from Last 3 Encounters:  12/28/22 149 lb 9.6 oz (67.9 kg)  12/24/22 150 lb 9.6 oz (68.3 kg)  11/25/22 144 lb (65.3 kg)     Physical Exam Constitutional:      General: She is not in acute distress.    Appearance: She is well-developed.  Cardiovascular:     Rate and Rhythm:  Normal rate and regular rhythm.  Pulmonary:     Breath sounds: Normal breath sounds.  Musculoskeletal:        General: Normal range of motion.  Skin:    General: Skin is warm and dry.  Neurological:     Mental Status: She is alert and oriented to person, place, and time.       Assessment & Plan:   Janeira was seen  today for arthritis.  Diagnoses and all orders for this visit:  Fibromyalgia -     CMP14+EGFR  NSAID long-term use  Other orders -     nabumetone (RELAFEN) 500 MG tablet; Take 2 tablets (1,000 mg total) by mouth 2 (two) times daily. For muscle and joint pain   I explained that use of prednisone long term is considered a last resort only due to potential side effects. She has a normal BUN & creatinine so, as long as kidney & liver function are monitoered and she reports any gastritis sx, she should be okay with nabumetone until she follows up with Dr. Reece Agar.     I am having [name] start on nabumetone. I am also having her maintain her BEE POLLEN PO, OVER THE COUNTER MEDICATION, ALOE VERA PO, CoQ-10, acetaminophen, aspirin EC, vitamin C, clobetasol cream, mupirocin ointment, UNABLE TO FIND, Misc Natural Products (TART CHERRY ADVANCED PO), Magnesium, Glucosamine-Chondroit-Vit C-Mn (GLUCOSAMINE 1500 COMPLEX PO), hydrALAZINE, rosuvastatin, chlorproMAZINE, cyclobenzaprine, predniSONE, olmesartan-hydrochlorothiazide, metoprolol tartrate, promethazine-dextromethorphan, amLODipine, and furosemide.  Allergies as of 12/28/2022       Reactions   Tape Other (See Comments)   Dermatitis rash "with extended exposure"   Prolia [denosumab] Other (See Comments)   Arthralgia/ myalgia/ jaw pain/ headache        Medication List        Accurate as of December 28, 2022  2:08 PM. If you have any questions, ask your nurse or doctor.          acetaminophen 500 MG tablet Commonly known as: TYLENOL Take 1,000 mg by mouth daily as needed for moderate pain or headache.   ALOE VERA PO Take 1 capsule by mouth daily.   amLODipine 2.5 MG tablet Commonly known as: NORVASC TAKE 1 TABLET BY MOUTH EVERY DAY   aspirin EC 81 MG tablet Take 1 tablet (81 mg total) by mouth daily.   BEE POLLEN PO Take 400 mg by mouth daily.   chlorproMAZINE 10 MG tablet Commonly known as: THORAZINE Take 1 tablet (10  mg total) by mouth 3 (three) times daily as needed for hiccoughs.   clobetasol cream 0.05 % Commonly known as: TEMOVATE APPLY 1 APPLICATION TOPICALLY 2 (TWO) TIMES DAILY AS NEEDED.   CoQ-10 100 MG Caps Take 100 mg by mouth daily.   cyclobenzaprine 10 MG tablet Commonly known as: FLEXERIL Take 1 tablet (10 mg total) by mouth at bedtime. To relax muscles   furosemide 20 MG tablet Commonly known as: LASIX TAKE 1 TABLET BY MOUTH EVERY DAY   GLUCOSAMINE 1500 COMPLEX PO Take by mouth.   hydrALAZINE 25 MG tablet Commonly known as: APRESOLINE Take 1 tablet (25 mg total) by mouth in the morning and at bedtime.   Magnesium 500 MG Caps Take by mouth.   metoprolol tartrate 50 MG tablet Commonly known as: LOPRESSOR TAKE 1 TABLET BY MOUTH TWICE A DAY   mupirocin ointment 2 % Commonly known as: Bactroban Apply to the affected area twice daily for 7-10 days   nabumetone 500 MG tablet Commonly known  as: RELAFEN Take 2 tablets (1,000 mg total) by mouth 2 (two) times daily. For muscle and joint pain Started by: Deacon Gadbois   olmesartan-hydrochlorothiazide 20-12.5 MG tablet Commonly known as: BENICAR HCT TAKE 1 TABLET BY MOUTH EVERY DAY   OVER THE COUNTER MEDICATION Take 1 capsule by mouth 2 (two) times daily. Hemp Oil   predniSONE 20 MG tablet Commonly known as: DELTASONE One twice daily with food for 2 weeks. Then one daily for 2 weeks. Take all of these. For inflammation of lungs and back muscles   promethazine-dextromethorphan 6.25-15 MG/5ML syrup Commonly known as: PROMETHAZINE-DM TAKE 5 MILLILITERS BY MOUTH 3 TIMES A DAY AS NEEDED FOR COUGH   rosuvastatin 10 MG tablet Commonly known as: CRESTOR Take 1 tablet (10 mg total) by mouth daily.   TART CHERRY ADVANCED PO Take by mouth daily.   UNABLE TO FIND Med Name: HEMP- Tumeric cream / RUB   vitamin C 1000 MG tablet Take 1,000 mg by mouth daily.         Follow-up: Return in about 3 months (around 03/29/2023)  for Arthritis, with PCP, Dr. Nadine Counts.  Mechele Claude, M.D.

## 2022-12-29 DIAGNOSIS — M79676 Pain in unspecified toe(s): Secondary | ICD-10-CM | POA: Diagnosis not present

## 2022-12-29 DIAGNOSIS — L03031 Cellulitis of right toe: Secondary | ICD-10-CM | POA: Diagnosis not present

## 2022-12-29 LAB — CMP14+EGFR
ALT: 21 IU/L (ref 0–32)
AST: 21 IU/L (ref 0–40)
Albumin: 3.9 g/dL (ref 3.7–4.7)
Alkaline Phosphatase: 57 IU/L (ref 44–121)
BUN/Creatinine Ratio: 14 (ref 12–28)
BUN: 15 mg/dL (ref 8–27)
Bilirubin Total: 0.3 mg/dL (ref 0.0–1.2)
CO2: 25 mmol/L (ref 20–29)
Calcium: 9.5 mg/dL (ref 8.7–10.3)
Chloride: 104 mmol/L (ref 96–106)
Creatinine, Ser: 1.07 mg/dL — ABNORMAL HIGH (ref 0.57–1.00)
Globulin, Total: 2 g/dL (ref 1.5–4.5)
Glucose: 95 mg/dL (ref 70–99)
Potassium: 5.4 mmol/L — ABNORMAL HIGH (ref 3.5–5.2)
Sodium: 143 mmol/L (ref 134–144)
Total Protein: 5.9 g/dL — ABNORMAL LOW (ref 6.0–8.5)
eGFR: 51 mL/min/{1.73_m2} — ABNORMAL LOW (ref >59)

## 2022-12-29 NOTE — Progress Notes (Signed)
Hello Elisia,  Your lab result is normal and/or stable.Some minor variations that are not significant are commonly marked abnormal, but do not represent any medical problem for you.  Best regards, Mechele Claude, M.D.

## 2023-01-05 ENCOUNTER — Ambulatory Visit (HOSPITAL_BASED_OUTPATIENT_CLINIC_OR_DEPARTMENT_OTHER): Payer: Medicare Other | Admitting: Pharmacist Clinician (PhC)/ Clinical Pharmacy Specialist

## 2023-01-05 ENCOUNTER — Encounter (HOSPITAL_BASED_OUTPATIENT_CLINIC_OR_DEPARTMENT_OTHER): Payer: Self-pay | Admitting: Pharmacist Clinician (PhC)/ Clinical Pharmacy Specialist

## 2023-01-05 VITALS — BP 149/56 | HR 84 | Ht 62.0 in | Wt 148.8 lb

## 2023-01-05 DIAGNOSIS — I10 Essential (primary) hypertension: Secondary | ICD-10-CM

## 2023-01-05 MED ORDER — OLMESARTAN MEDOXOMIL-HCTZ 20-12.5 MG PO TABS: 1.0000 | ORAL_TABLET | Freq: Every day | ORAL | 1 refills | Status: DC

## 2023-01-05 NOTE — Assessment & Plan Note (Signed)
Assessment: BP is uncontrolled in office BP 149/56 mmHg;  above the goal (<130/80). Has been out of olmesartan hctz for at least a month Tolerates current medications well without any side effects Denies SOB, palpitation, chest pain, headaches,or swelling Reiterated the importance of regular exercise and low salt diet   Plan:  Start taking olmesartan hctz 20/12.5 again.   Continue taking all other medications Patient to keep record of BP readings with heart rate and report to [place] at the next visit Patient to follow up with Dr. Eden Emms in January  Labs ordered today:  none

## 2023-01-05 NOTE — Patient Instructions (Signed)
Follow up appointment: schedule with Dr. Eden Emms for January   Take your BP meds as follows:  Continue amlodipine 2.5 mg once daily  Re-start olmesartan hctz 20/12.5 mg once daily  Check your blood pressure at home daily (if able) and keep record of the readings.  Hypertension "High blood pressure"  Hypertension is often called "The Silent Killer." It rarely causes symptoms until it is extremely  high or has done damage to other organs in the body. For this reason, you should have your  blood pressure checked regularly by your physician. We will check your blood pressure  every time you see a provider at one of our offices.   Your blood pressure reading consists of two numbers. Ideally, blood pressure should be  below 120/80. The first ("top") number is called the systolic pressure. It measures the  pressure in your arteries as your heart beats. The second ("bottom") number is called the diastolic pressure. It measures the pressure in your arteries as the heart relaxes between beats.  The benefits of getting your blood pressure under control are enormous. A 10-point  reduction in systolic blood pressure can reduce your risk of stroke by 27% and heart failure by 28%  Your blood pressure goal is < 130/80  To check your pressure at home you will need to:  1. Sit up in a chair, with feet flat on the floor and back supported. Do not cross your ankles or legs. 2. Rest your left arm so that the cuff is about heart level. If the cuff goes on your upper arm,  then just relax the arm on the table, arm of the chair or your lap. If you have a wrist cuff, we  suggest relaxing your wrist against your chest (think of it as Pledging the Flag with the  wrong arm).  3. Place the cuff snugly around your arm, about 1 inch above the crook of your elbow. The  cords should be inside the groove of your elbow.  4. Sit quietly, with the cuff in place, for about 5 minutes. After that 5 minutes press the  power  button to start a reading. 5. Do not talk or move while the reading is taking place.  6. Record your readings on a sheet of paper. Although most cuffs have a memory, it is often  easier to see a pattern developing when the numbers are all in front of you.  7. You can repeat the reading after 1-3 minutes if it is recommended  Make sure your bladder is empty and you have not had caffeine or tobacco within the last 30 min  Always bring your blood pressure log with you to your appointments. If you have not brought your monitor in to be double checked for accuracy, please bring it to your next appointment.  You can find a list of quality blood pressure cuffs at validatebp.org

## 2023-01-05 NOTE — Progress Notes (Signed)
Office Visit    Patient Name: [name] Date of Encounter: 01/05/2023  Primary Care Provider:  Raliegh Ip, DO Primary Cardiologist:  Charlton Haws, MD  Chief Complaint    Hypertension - Advanced hypertension clinic  Past Medical History   Hyperlipidemia 2/24 LDL 146; on rosuvastatin 5 mg, abdominal aortic atherosclerosis noted, goal < 70  Aortic stenosis TAVR 10/2017  OSA CPAP intolerant  palpitations Uses prn metoprolol (? She isn't sure if she has this)  fibromyalgia     Allergies  Allergen Reactions   Tape Other (See Comments)    Dermatitis rash "with extended exposure"   Prolia [Denosumab] Other (See Comments)    Arthralgia/ myalgia/ jaw pain/ headache    History of Present Illness    [name] is a 87 y.o. female patient who was referred to the [facility] by Dr. Nadine Counts.  Patient reported having labile blood pressures at home, as high as 180 systolic, but dropping rapidly just a few minutes later.  She admitted to being under a lot of stress after the death of her son (suicide) and chronic pain.  When she saw Dr. Duke Salvia last month her pressure was 138/52.  Amlodipine 5 mg daily was added to her regimen.  At her first office follow up she complained of getting weak easily and taking a nap or two during the day.  Noted that even talking makes her tired.  She brought a bag of home medications but went back and forth between this being all of her meds or just her heart meds.  Unfortunately the bag only contained amlodipine, hydralazine and aspirin.  Patient tells [place] that her husband (age 90) gets her meds out for her daily and she takes whatever he gives her.  She was asked to stop losartan hctz (if she had it at home) and start olmesartan hctz 20/12.5 mg once daily, and continue amlodipine and hydralazine.  At second follow up we decreased amlodipine from 5 to 2.5 mg daily secondary to low diastolic BP readings  Today she returns for follow up.    She has been out of the olmesartan/hctz for over a month, says the pharmacy told her she would have to be seen for refills.  Not sure what this is about, as there was a refill on the bottle.  She had previously stopped her amlodipine, states home BP was down to 105 systolic.  When she ran out of the olmesartan/hctz she re-started the amlodipine, as her pressure spiked up to 190.   Today we cleaned out her medication list, as she had multiple OTC items that she no longer takes.    Blood Pressure Goal:  130/80  Current Medications: amlodipine 2.5 mg qd,,  hydralazine 25 mg bid, metoprolol tart 50 mg bid  Adherence Assessment  Do you ever forget to take your medication? [] Yes [x] No - but unknown what she is taking  Do you ever skip doses due to side effects? [] Yes [x] No  Do you have trouble affording your medicines? [] Yes [x] No  Are you ever unable to pick up your medication due to transportation difficulties? [] Yes [x] No  Do you ever stop taking your medications because you don't believe they are helping? [] Yes [x] No   Adherence strategy: takes from the bottle daily  Social Hx:      Tobacco:no  Alcohol: only margarita aobut twcie per year  Caffeine:  coffee in the morning, no soda or tea;   Diet:    eats  breakfast and dinner; often chicken or beef for dinner, had sandwich last night; breakfast is cereal (Muesli) or eggs; did cut back on cookies after breakfast; no chips or crackers, no sweet tooth  Exercise: limited 2/2 fibromyalgia, arthritis   Home BP readings:  no written readings with her today.  Did bring home cuff, which read within 5 points of the office cuff today.  Machine noted average of last 3 readings was 156/57.  She had multiple readings showing diastolic to be < 55.     Accessory Clinical Findings    Lab Results  Component Value Date   CREATININE 1.07 (H) 12/28/2022   BUN 15 12/28/2022   NA 143 12/28/2022   K 5.4 (H) 12/28/2022   CL 104 12/28/2022   CO2 25  12/28/2022   Lab Results  Component Value Date   ALT 21 12/28/2022   AST 21 12/28/2022   ALKPHOS 57 12/28/2022   BILITOT 0.3 12/28/2022   Lab Results  Component Value Date   HGBA1C 5.7 (H) 10/15/2017    Screening for Secondary Hypertension:      05/05/2022    2:19 PM  Causes  Drugs/Herbals Screened     - Comments One coffee per day.  No NSAIDS.  No EtOH  Renovascular HTN N/A  Sleep Apnea Screened     - Comments Snores.  Daytime somnolence.  Thyroid Disease Screened     - Comments check TSH  Hyperaldosteronism N/A  Pheochromocytoma Screened     - Comments Check catecholamines and metanephrines given labile HTN  Cushing's Syndrome N/A  Hyperparathyroidism Screened  Coarctation of the Aorta Screened     - Comments BP symmetric  Compliance Screened    Relevant Labs/Studies:    Latest Ref Rng & Units 12/28/2022    2:07 PM 05/18/2022    9:55 AM 01/07/2022    2:12 PM  Basic Labs  Sodium 134 - 144 mmol/L 143  140  136   Potassium 3.5 - 5.2 mmol/L 5.4  5.0  5.2   Creatinine 0.57 - 1.00 mg/dL 1.61  0.96  0.45        Latest Ref Rng & Units 05/18/2022    9:55 AM 02/12/2020    4:22 PM  Thyroid   TSH 0.450 - 4.500 uIU/mL 3.460  2.840           Latest Ref Rng & Units 05/18/2022    9:55 AM  Metanephrines/Catecholamines   Epinephrine 0 - 62 pg/mL 36   Norepinephrine 0 - 874 pg/mL 1,062   Dopamine 0 - 48 pg/mL <30   Metanephrines 0.0 - 88.0 pg/mL <25.0   Normetanephrines  0.0 - 297.2 pg/mL 139.9             Home Medications    Current Outpatient Medications  Medication Sig Dispense Refill   amLODipine (NORVASC) 2.5 MG tablet TAKE 1 TABLET BY MOUTH EVERY DAY 90 tablet 1   Ascorbic Acid (VITAMIN C) 1000 MG tablet Take 1,000 mg by mouth daily.     aspirin EC 81 MG tablet Take 1 tablet (81 mg total) by mouth daily. 90 tablet 3   chlorproMAZINE (THORAZINE) 10 MG tablet Take 1 tablet (10 mg total) by mouth 3 (three) times daily as needed for hiccoughs. 30 tablet 2    clobetasol cream (TEMOVATE) 0.05 % APPLY 1 APPLICATION TOPICALLY 2 (TWO) TIMES DAILY AS NEEDED. 30 g 3   furosemide (LASIX) 20 MG tablet TAKE 1 TABLET BY MOUTH EVERY DAY 90 tablet  3   hydrALAZINE (APRESOLINE) 25 MG tablet Take 1 tablet (25 mg total) by mouth in the morning and at bedtime. 270 tablet 3   Magnesium 500 MG CAPS Take by mouth.     metoprolol tartrate (LOPRESSOR) 50 MG tablet TAKE 1 TABLET BY MOUTH TWICE A DAY 180 tablet 3   nabumetone (RELAFEN) 500 MG tablet Take 2 tablets (1,000 mg total) by mouth 2 (two) times daily. For muscle and joint pain 360 tablet 1   OVER THE COUNTER MEDICATION Take 1 capsule by mouth 2 (two) times daily. Hemp Oil     rosuvastatin (CRESTOR) 10 MG tablet Take 1 tablet (10 mg total) by mouth daily. 90 tablet 3   trolamine salicylate (BLUE-EMU HEMP) 10 % cream Apply 1 Application topically as needed for muscle pain.     acetaminophen (TYLENOL) 500 MG tablet Take 1,000 mg by mouth daily as needed for moderate pain or headache.     mupirocin ointment (BACTROBAN) 2 % Apply to the affected area twice daily for 7-10 days 22 g 0   olmesartan-hydrochlorothiazide (BENICAR HCT) 20-12.5 MG tablet TAKE 1 TABLET BY MOUTH EVERY DAY (Patient not taking: Reported on 01/05/2023) 90 tablet 1   No current facility-administered medications for this visit.     Assessment & Plan   HYPERTENSION CONTROL Vitals:   01/05/23 1101 01/05/23 1104  BP: (!) 158/54 (!) 149/56    The patient's blood pressure is elevated above target today.  In order to address the patient's elevated BP: A new medication was prescribed today.; Blood pressure will be monitored at home to determine if medication changes need to be made.      Essential hypertension Assessment: BP is uncontrolled in office BP 149/56 mmHg;  above the goal (<130/80). Has been out of olmesartan hctz for at least a month Tolerates current medications well without any side effects Denies SOB, palpitation, chest pain,  headaches,or swelling Reiterated the importance of regular exercise and low salt diet   Plan:  Start taking olmesartan hctz 20/12.5 again.   Continue taking all other medications Patient to keep record of BP readings with heart rate and report to [place] at the next visit Patient to follow up with Dr. Eden Emms in January  Labs ordered today:  none   Phillips Hay PharmD CPP [facility]  [address] [place], [place] [address] [phone]

## 2023-01-11 ENCOUNTER — Encounter: Payer: Self-pay | Admitting: Family Medicine

## 2023-01-11 ENCOUNTER — Ambulatory Visit (INDEPENDENT_AMBULATORY_CARE_PROVIDER_SITE_OTHER): Payer: Medicare Other | Admitting: Family Medicine

## 2023-01-11 VITALS — BP 146/51 | HR 98 | Temp 98.1°F | Ht 62.0 in | Wt 146.1 lb

## 2023-01-11 DIAGNOSIS — R519 Headache, unspecified: Secondary | ICD-10-CM | POA: Diagnosis not present

## 2023-01-11 MED ORDER — NURTEC 75 MG PO TBDP
75.0000 mg | ORAL_TABLET | Freq: Every day | ORAL | Status: DC | PRN
Start: 2023-01-11 — End: 2023-06-19

## 2023-01-11 NOTE — Patient Instructions (Signed)

## 2023-01-11 NOTE — Progress Notes (Signed)
Acute Office Visit  Subjective:     Patient ID: [name], female    DOB: 10/15/1935, 87 y.o.   MRN: [id number]  Chief Complaint  Patient presents with   Headache    Headache  This is a new problem. The current episode started in the past 7 days. The problem has been waxing and waning. The pain is located in the Left unilateral region. The pain radiates to the face. Quality: pressure. The pain is at a severity of 8/10. Associated symptoms include nausea (this morning) and neck pain (left sided). Pertinent negatives include no abnormal behavior, blurred vision, coughing, dizziness, drainage, ear pain, eye pain, eye redness, eye watering, facial sweating, fever, hearing loss, loss of balance, muscle aches, numbness, phonophobia, photophobia, rhinorrhea, sinus pressure, sore throat, tingling, tinnitus, visual change, vomiting or weakness. Nothing aggravates the symptoms. She has tried NSAIDs, acetaminophen and cold packs for the symptoms. The treatment provided mild relief. Her past medical history is significant for hypertension and migraine headaches (as a child).   Patient is in today for headache. No injury. Hx of headaches. Stopped taking an NSAID she has been prescribed a few days ago. Her headache has been worse for the last few days.   Review of Systems  Constitutional:  Negative for fever.  HENT:  Negative for ear pain, hearing loss, rhinorrhea, sinus pressure, sore throat and tinnitus.   Eyes:  Negative for blurred vision, photophobia, pain and redness.  Respiratory:  Negative for cough.   Gastrointestinal:  Positive for nausea (this morning). Negative for vomiting.  Musculoskeletal:  Positive for neck pain (left sided).  Neurological:  Positive for headaches. Negative for dizziness, tingling, weakness, numbness and loss of balance.        Objective:    BP (!) 146/51   Pulse 98   Temp 98.1 F (36.7 C) (Temporal)   Ht 5\' 2"  (1.575 m)   Wt 146 lb 2 oz (66.3 kg)   SpO2  98%   BMI 26.73 kg/m    Physical Exam Vitals and nursing note reviewed.  Constitutional:      General: She is not in acute distress.    Appearance: She is not ill-appearing, toxic-appearing or diaphoretic.  HENT:     Head: Normocephalic and atraumatic.     Mouth/Throat:     Mouth: Mucous membranes are moist.     Pharynx: Oropharynx is clear.  Eyes:     General: No visual field deficit or scleral icterus.    Extraocular Movements: Extraocular movements intact.     Right eye: Normal extraocular motion and no nystagmus.     Left eye: Normal extraocular motion.     Pupils: Pupils are equal, round, and reactive to light. Pupils are equal.  Cardiovascular:     Rate and Rhythm: Normal rate and regular rhythm.     Heart sounds: Normal heart sounds. No murmur heard. Pulmonary:     Effort: Pulmonary effort is normal.     Breath sounds: Normal breath sounds.  Abdominal:     General: There is no distension.     Tenderness: There is no abdominal tenderness.  Musculoskeletal:     Cervical back: Normal range of motion and neck supple. No rigidity. Muscular tenderness (left side) present. No spinous process tenderness.  Lymphadenopathy:     Cervical: No cervical adenopathy.  Skin:    General: Skin is warm and dry.  Neurological:     Mental Status: She is alert and oriented to person,  place, and time.     Cranial Nerves: No cranial nerve deficit or facial asymmetry.     Sensory: No sensory deficit.     Motor: No weakness.     Coordination: Coordination normal.     Gait: Gait normal.  Psychiatric:        Mood and Affect: Mood normal.        Speech: Speech normal.        Behavior: Behavior normal.     No results found for any visits on 01/11/23.      Assessment & Plan:   [name] was seen today for headache.  Diagnoses and all orders for this visit:  Acute nonintractable headache, unspecified headache type Consistent with migraine headache, remote hx of migraines. Normal neuro  exam today, no focal weakness. Nurtec samples given. Return to office for new or worsening symptoms, or if symptoms persist.   The patient indicates understanding of these issues and agrees with the plan.  Gabriel Earing, FNP

## 2023-01-12 ENCOUNTER — Telehealth: Payer: Self-pay | Admitting: Family Medicine

## 2023-01-12 NOTE — Telephone Encounter (Signed)
There's not much to offer her outside of an OV visit for migraine cocktail shot.  I don't think we can do these outside of a visit though.

## 2023-01-13 NOTE — Telephone Encounter (Signed)
Left vm for cb

## 2023-01-19 NOTE — Telephone Encounter (Signed)
Na  No call back  Patient needs to be seen  This encounter will be closed.

## 2023-01-27 ENCOUNTER — Other Ambulatory Visit: Payer: Self-pay | Admitting: Family Medicine

## 2023-02-22 ENCOUNTER — Ambulatory Visit (HOSPITAL_COMMUNITY): Payer: Medicare Other

## 2023-02-26 ENCOUNTER — Ambulatory Visit (HOSPITAL_COMMUNITY)
Admission: RE | Admit: 2023-02-26 | Discharge: 2023-02-26 | Disposition: A | Discharge status: Home / Self Care | Payer: Medicare Other | Source: Ambulatory Visit | Attending: Family Medicine

## 2023-02-26 DIAGNOSIS — R918 Other nonspecific abnormal finding of lung field: Secondary | ICD-10-CM | POA: Insufficient documentation

## 2023-03-10 ENCOUNTER — Ambulatory Visit: Payer: Medicare Other | Admitting: Dermatology

## 2023-03-23 ENCOUNTER — Ambulatory Visit: Payer: Medicare Other | Admitting: Dermatology

## 2023-03-23 ENCOUNTER — Encounter: Payer: Self-pay | Admitting: Dermatology

## 2023-03-23 VITALS — BP 108/60

## 2023-03-23 DIAGNOSIS — L720 Epidermal cyst: Secondary | ICD-10-CM

## 2023-03-23 DIAGNOSIS — L820 Inflamed seborrheic keratosis: Secondary | ICD-10-CM

## 2023-03-23 DIAGNOSIS — D485 Neoplasm of uncertain behavior of skin: Secondary | ICD-10-CM

## 2023-03-23 DIAGNOSIS — D492 Neoplasm of unspecified behavior of bone, soft tissue, and skin: Secondary | ICD-10-CM

## 2023-03-23 NOTE — Patient Instructions (Signed)

## 2023-03-23 NOTE — Progress Notes (Signed)
   New Patient Visit   Subjective  [name] is a 87 y.o. female who presents for the following: Spot of right cheek x ~ 3 years. It is getting bigger. No itch or pain.   The following portions of the chart were reviewed this encounter and updated as appropriate: medications, allergies, medical history  Review of Systems:  No other skin or systemic complaints except as noted in HPI or Assessment and Plan.  Objective  Well appearing patient in no apparent distress; mood and affect are within normal limits.    A focused examination was performed of the following areas: Face  Relevant exam findings are noted in the Assessment and Plan.  Right Malar Cheek 7 mm crusted papule on erythematous base  Left Anterior Neck White papule    Assessment & Plan   1. Suspicious Skin Lesion on Cheek - Assessment: Patient presents with a lesion on the cheek that started about 3 years ago, which is now becoming hard and growing outward. The differential diagnosis includes benign lesion versus nonmelanoma skin cancer. - Plan:   - Perform shave biopsy of the cheek lesion.   - Send specimen to lab for microscopic examination.   - Inform patient of results within a week.     - If benign, allow area to heal.     - If malignant, refer to Dr. Caralyn Guile for surgical excision.   - Patient to keep area covered with Aquaphor and a Band-Aid.   - Continue wound care for approximately 2 weeks.  2. Milia on Neck - Assessment: Patient has a small cyst on the neck, clinically diagnosed as benign. - Plan:   - Remove cyst contents via manual expression.   PROCEDURE NOTE Neoplasm of uncertain behavior of skin Right Malar Cheek  Skin / nail biopsy Type of biopsy: tangential   Informed consent: discussed and consent obtained   Timeout: patient name, date of birth, surgical site, and procedure verified   Procedure prep:  Patient was prepped and draped in usual sterile fashion Prep type:  Isopropyl  alcohol Anesthesia: the lesion was anesthetized in a standard fashion   Anesthetic:  1% lidocaine w/ epinephrine 1-100,000 buffered w/ 8.4% NaHCO3 Instrument used: flexible razor blade   Hemostasis achieved with: pressure, aluminum chloride and electrodesiccation   Outcome: patient tolerated procedure well   Post-procedure details: sterile dressing applied and wound care instructions given   Dressing type: bandage and petrolatum    Specimen 1 - Surgical pathology Differential Diagnosis: R/O ISK vs NMSC  Check Margins: No  Milia Left Anterior Neck  Acne/Milia surgery - Left Anterior Neck Procedure risks and benefits were discussed with the patient and verbal consent was obtained. Following prep of the skin on the left neck with an alcohol swab and local anesthesia injection, extraction of milia was performed with a comedone extractor following superficial incision made over their surfaces with an 18g needle. Capillary hemostasis was achieved with 20% aluminum chloride solution. Vaseline ointment was applied to each site. The patient tolerated the procedure well.    Return if symptoms worsen or fail to improve.  I, Joanie Coddington, CMA, am acting as scribe for [employer], DO .   Documentation: I have reviewed the above documentation for accuracy and completeness, and I agree with the above.  Langston Reusing, DO

## 2023-03-25 DIAGNOSIS — M79676 Pain in unspecified toe(s): Secondary | ICD-10-CM | POA: Diagnosis not present

## 2023-03-25 DIAGNOSIS — B351 Tinea unguium: Secondary | ICD-10-CM | POA: Diagnosis not present

## 2023-03-25 LAB — SURGICAL PATHOLOGY

## 2023-03-26 ENCOUNTER — Ambulatory Visit (INDEPENDENT_AMBULATORY_CARE_PROVIDER_SITE_OTHER): Payer: Medicare Other | Admitting: Nurse Practitioner

## 2023-03-26 ENCOUNTER — Encounter: Payer: Self-pay | Admitting: Nurse Practitioner

## 2023-03-26 VITALS — BP 147/54 | HR 89 | Temp 97.7°F | Resp 20 | Ht 62.0 in | Wt 146.0 lb

## 2023-03-26 DIAGNOSIS — J40 Bronchitis, not specified as acute or chronic: Secondary | ICD-10-CM | POA: Diagnosis not present

## 2023-03-26 MED ORDER — AZITHROMYCIN 250 MG PO TABS: ORAL_TABLET | ORAL | 0 refills | Status: DC

## 2023-03-26 MED ORDER — PREDNISONE 20 MG PO TABS: 40.0000 mg | ORAL_TABLET | Freq: Every day | ORAL | 0 refills | Status: AC

## 2023-03-26 MED ORDER — HYDROCODONE BIT-HOMATROP MBR 5-1.5 MG/5ML PO SOLN: 5.0000 mL | Freq: Four times a day (QID) | ORAL | 0 refills | Status: DC | PRN

## 2023-03-26 NOTE — Progress Notes (Signed)
Subjective:    Patient ID: [name], female    DOB: [birth year], 87 y.o.   MRN: [id number]  Cough This is a new problem. The current episode started 1 to 4 weeks ago. The problem has been gradually worsening. The cough is Productive of sputum. Associated symptoms include ear congestion, nasal congestion and rhinorrhea. Pertinent negatives include no chills, fever, sore throat or shortness of breath. Nothing aggravates the symptoms. Treatments tried: promethazine DM. The treatment provided no relief.      Review of Systems  Constitutional:  Negative for chills and fever.  HENT:  Positive for congestion and rhinorrhea. Negative for sore throat.   Respiratory:  Positive for cough. Negative for shortness of breath.        Objective:   Physical Exam Constitutional:      Appearance: Normal appearance.  HENT:     Right Ear: Tympanic membrane normal.     Left Ear: Tympanic membrane normal.     Nose: Congestion and rhinorrhea present.     Mouth/Throat:     Pharynx: No oropharyngeal exudate or posterior oropharyngeal erythema.  Cardiovascular:     Rate and Rhythm: Normal rate and regular rhythm.     Heart sounds: Murmur (2/6 systolic) heard.  Pulmonary:     Effort: Respiratory distress: faint exp wheezes bil lower lobes.     Breath sounds: Wheezing present.  Musculoskeletal:     Cervical back: Normal range of motion and neck supple.  Skin:    General: Skin is warm.  Neurological:     General: No focal deficit present.     Mental Status: She is alert and oriented to person, place, and time.  Psychiatric:        Mood and Affect: Mood normal.        Behavior: Behavior normal.    BP (!) 147/54   Pulse 89   Temp 97.7 F (36.5 C) (Temporal)   Resp 20   Ht 5\' 2"  (1.575 m)   Wt 146 lb (66.2 kg)   SpO2 96%   BMI 26.70 kg/m         Assessment & Plan:   [name] in today with chief complaint of Cough and Nasal Congestion   1. Bronchitis (Primary) 1. Take meds as  prescribed 2. Use a cool mist humidifier especially during the winter months and when heat has been humid. 3. Use saline nose sprays frequently 4. Saline irrigations of the nose can be very helpful if done frequently.  * 4X daily for 1 week*  * Use of a nettie pot can be helpful with this. Follow directions with this* 5. Drink plenty of fluids 6. Keep thermostat turn down low 7.For any cough or congestion- hycodan at night time 8. For fever or aces or pains- take tylenol or ibuprofen appropriate for age and weight.  * for fevers greater than 101 orally you may alternate ibuprofen and tylenol every  3 hours.    - azithromycin (ZITHROMAX Z-PAK) 250 MG tablet; As directed  Dispense: 6 tablet; Refill: 0 - HYDROcodone bit-homatropine (HYCODAN) 5-1.5 MG/5ML syrup; Take 5 mLs by mouth every 6 (six) hours as needed for cough.  Dispense: 120 mL; Refill: 0 - predniSONE (DELTASONE) 20 MG tablet; Take 2 tablets (40 mg total) by mouth daily with breakfast for 5 days. 2 po daily for 5 days  Dispense: 10 tablet; Refill: 0    The above assessment and management plan was discussed with the patient. The patient  verbalized understanding of and has agreed to the management plan. Patient is aware to call the clinic if symptoms persist or worsen. Patient is aware when to return to the clinic for a follow-up visit. Patient educated on when it is appropriate to go to the emergency department.   Mary-Margaret Daphine Deutscher, FNP

## 2023-03-26 NOTE — Patient Instructions (Signed)
1. Take meds as prescribed 2. Use a cool mist humidifier especially during the winter months and when heat has been humid. 3. Use saline nose sprays frequently 4. Saline irrigations of the nose can be very helpful if done frequently.  * 4X daily for 1 week*  * Use of a nettie pot can be helpful with this. Follow directions with this* 5. Drink plenty of fluids 6. Keep thermostat turn down low 7.For any cough or congestion- hycodan at night 8. For fever or aces or pains- take tylenol or ibuprofen appropriate for age and weight.  * for fevers greater than 101 orally you may alternate ibuprofen and tylenol every  3 hours.

## 2023-04-05 ENCOUNTER — Telehealth: Payer: Self-pay | Admitting: Pulmonary Disease

## 2023-04-05 NOTE — Telephone Encounter (Signed)
Patient seen by Dr. Tonia Brooms in 9/202 and advised to have HRCT chest in 06/2023. However, pt had a contrasted CT by PCP in 02/2023. Also, unsure why pt has OV in Jan 2025 to see Icard as HRCT isnt due until 06/2023.   Dr. Tonia Brooms, please advise if you would still like to have HRCT chest in 06/2023 OR push HRCT back since PCP had CT done. Thanks.

## 2023-04-08 NOTE — Telephone Encounter (Signed)
Called and spoke to pt regarding her HRCT per Dr. Tonia Brooms. Per Dr. Tonia Brooms pt does not need CT chest. When speaking with pt the phone line went silent and disconnect. Attempted to call back and received busy signal. Will call back.

## 2023-04-08 NOTE — Telephone Encounter (Signed)
Please advise if you would like to keep the HRCT in 06/2023 as pt had a CT with contrast in 02/2023. Thanks.

## 2023-04-08 NOTE — Telephone Encounter (Signed)
Called patient back. Informed her of the recs per Dr. Tonia Brooms. Patient states she would still like to be seen since she is having an increase in mucus production and cough. Patient states she wants to push the appt back to Feb as she has schedule conflict on 04/19/2023. Appt scheduled with Dr. Tonia Brooms on 05/17/2023. Pt verbalized understanding and denied any further questions or concerns at this time.

## 2023-04-16 NOTE — Progress Notes (Signed)
Cardiology Office Note:    Date:  04/30/2023   ID:  [name], DOB May 22, 1935, MRN [id number]  PCP:  Megan Ip, DO  [facility] Providers Cardiologist:  Megan Haws, MD     Referring MD: Megan Ip, DO    History of Present Illness:   [name] is a 88 y.o. female with  history of HTN, Severe AS TAVR done 10/19/17 with 20 mm Sapien valve stable moderate PVL by last TTE 02/20/20 mean gradient 16 mmHg. Cath 09/29/17 prior to TAVR with no CAD, palpitations, HLD, fatigue.  Seen 05/12/21 and cozaar increased 100 mg daily.   PCP added HCTZ to losartan 12/02/21 for elevated BP's  She is depressed and less functional Patient has a lot of arthritis and having a lot of pain and taking magnesium that helps. She lost 2 sons and a brother and his wife. Her son committed suicide. Tearful and gets upset and wants to lay down. Says she's not depressed and is very strong. She's surrounded by grandchildren.  Has lost about 10 lbs.   Seen by PA 12/23/21 and hydralazine added TTE 12/24/21 stable moderate PVL mean gradient 13 mmHg and EF remained normal with only mild MR   Her 16 yo husband gives her meds Seen in HTN clinic 01/05/23 and had been out of ARB/diuretic Current regimen should be Olmesartan/hydrochlorothiazide 20/12/5 mg, hydralazine 25 mg bid, Lasix 20 mg daily and norvasc 2.5 mg daily  Rx Z pack for bronchitis 03/26/23 CT 02/26/23 with unchanged nodules stable from 2019      Past Medical History:  Diagnosis Date   Chronic headaches    Fibromyalgia    GERD (gastroesophageal reflux disease)    Hemorrhoids    INTERNAL--  POST BANDING 02/ 2015   History of kidney stones    Hyperlipidemia    Hypertension    Moderate aortic stenosis    NSAID long-term use 05/30/2013   OSA (obstructive sleep apnea) CPAP INTOLERANT    Osteoporosis    S/P TAVR (transcatheter aortic valve replacement) 10/19/2017   20 mm Edwards Sapien 3 transcatheter heart valve placed via  percutaneous right transfemoral approach    Severe aortic stenosis    Spinal stenosis, multilevel    Current Medications: No outpatient medications have been marked as taking for the 04/30/23 encounter (Office Visit) with Wendall Stade, MD.    Allergies:   Tape and Prolia [denosumab]   Social History   Tobacco Use   Smoking status: Former    Current packs/day: 0.00    Average packs/day: 0.3 packs/day for 5.0 years (1.3 ttl pk-yrs)    Types: Cigarettes    Start date: 04/13/1998    Quit date: 04/14/2003    Years since quitting: 20.0    Passive exposure: Past   Smokeless tobacco: Never  Vaping Use   Vaping status: Never Used  Substance Use Topics   Alcohol use: Yes    Comment: once in a while-social   Drug use: Never    Comment: hemp oil     Family Hx: The patient's family history includes Cancer (age of onset: 23) in her son; Cirrhosis in her mother; Gallbladder disease in her maternal grandmother; Heart disease in her sister; Heart failure in her maternal grandmother; Other in her brother; Stomach cancer in her paternal grandfather.  ROS   EKGs/Labs/Other Test Reviewed:    EKG:  EKG is  not ordered today.     Recent Labs: 05/18/2022: TSH 3.460  11/23/2022: Hemoglobin 12.2; Platelets 203 12/28/2022: ALT 21; BUN 15; Creatinine, Ser 1.07; Potassium 5.4; Sodium 143   Recent Lipid Panel Recent Labs    05/18/22 0955  CHOL 229*  TRIG 126  HDL 61  LDLCALC 146*     Prior CV Studies:   2D ECHO  02/20/20    Study Conclusions   - Left ventricle: The cavity size was normal. There was mild focal    basal hypertrophy of the septum. Systolic function was vigorous.    The estimated ejection fraction was in the range of 65% to 70%.    Wall motion was normal; there were no regional wall motion    abnormalities. Doppler parameters are consistent with abnormal    left ventricular relaxation (grade 1 diastolic dysfunction).  - Aortic valve: A new bioprosthesis was present and  functioning    normally. The prosthesis had a normal range of motion. The device    appeared normal, had no rocking motion, and showed no evidence of    dehiscence. There was trivial regurgitation. Mean gradient (S):    12 mm Hg. Valve area (VTI): 1.95 cm^2. Indexed valve area (VTI):    1.08 cm^2/m^2. Valve area (Vmax): 1.67 cm^2. Valve area (Vmean):    1.7 cm^2.  - Mitral valve: Calcified annulus.   Impressions:   - Post-TAVR, the new bioprosthesis appears well seated without    significant stenosis. Very trivial AR. LV EF is normal.      RIGHT/LEFT HEART CATH AND CORONARY ANGIOGRAPHY  09/29/17  Conclusion    1.  Known severe aortic stenosis by noninvasive assessment with heavily calcified and restricted aortic valve leaflets biplane fluoroscopy 2.  Heavy mitral annular calcium 3.  Widely patent coronary arteries with minor luminal irregularities 4.  Mildly elevated right heart pressures    Recommendation: Continued multidisciplinary heart team evaluation for treatment of severe aortic stenosis       Physical Exam:    VS:  BP (!) 156/58   Pulse 90   Ht 5\' 2"  (1.575 m)   SpO2 96%   BMI 26.70 kg/m   Repeat BP 148/ 56 mmHg    Wt Readings from Last 3 Encounters:  03/26/23 146 lb (66.2 kg)  01/11/23 146 lb 2 oz (66.3 kg)  01/05/23 148 lb 12.8 oz (67.5 kg)    Physical Exam   Depressed/Anxious  Healthy:  appears stated age HEENT: normal Neck supple with no adenopathy JVP normal no bruits no thyromegaly Lungs clear with no wheezing and good diaphragmatic motion Heart:  S1/S2 no murmur, no rub, gallop or click PMI normal Abdomen: benighn, BS positve, no tenderness, no AAA no bruit.  No HSM or HJR Distal pulses intact with no bruits No edema Neuro non-focal Skin warm and dry No muscular weakness       ASSESSMENT & PLAN:   No problem-specific Assessment & Plan notes found for this encounter.   HTN - See updated list in HPI - labile due to age and depression -   continue low sodium DASH diet    Severe AS s/p TVAR - suboptimal result with small 20 mm Sapien valve and moderate PVL TTE 12/24/21 stable -  mean gradient 13 mmHg   Palpitations -   Zio monitor done 02/2018 no arrhythmia     Fatigue/Mailaise:  - chronic related to age and depression f/u primary    Edema:  - Better off norvasc and on diuretic    Depression: - reactive from family losses  -  F/U BH/primary not on SSRI/MAO      Dispo:  No follow-ups on file.   Medication Adjustments/Labs and Tests Ordered: Current medicines are reviewed at length with the patient today.  Concerns regarding medicines are outlined above.  Tests Ordered: Orders Placed This Encounter  Procedures   EKG 12-Lead   Medication Changes: No orders of the defined types were placed in this encounter.  Signed, Megan Haws, MD  04/30/2023 9:24 AM    [facility] [address] [place], [place], [place]  [address] Phone: [phone]; Fax: [phone]

## 2023-04-19 ENCOUNTER — Ambulatory Visit: Payer: Medicare Other | Admitting: Pulmonary Disease

## 2023-04-22 ENCOUNTER — Other Ambulatory Visit: Payer: Self-pay | Admitting: Family Medicine

## 2023-04-22 ENCOUNTER — Other Ambulatory Visit (HOSPITAL_BASED_OUTPATIENT_CLINIC_OR_DEPARTMENT_OTHER): Payer: Self-pay | Admitting: Cardiovascular Disease

## 2023-04-30 ENCOUNTER — Encounter: Payer: Self-pay | Admitting: Cardiovascular Disease

## 2023-04-30 ENCOUNTER — Ambulatory Visit: Payer: Medicare Other | Attending: Cardiovascular Disease | Admitting: Cardiovascular Disease

## 2023-04-30 VITALS — BP 156/58 | HR 90 | Ht 62.0 in

## 2023-04-30 DIAGNOSIS — I350 Nonrheumatic aortic (valve) stenosis: Secondary | ICD-10-CM

## 2023-04-30 DIAGNOSIS — I10 Essential (primary) hypertension: Secondary | ICD-10-CM

## 2023-04-30 DIAGNOSIS — Z952 Presence of prosthetic heart valve: Secondary | ICD-10-CM

## 2023-04-30 NOTE — Patient Instructions (Addendum)
Medication Instructions:  Your physician recommends that you continue on your current medications as directed. Please refer to the Current Medication list given to you today.  *If you need a refill on your cardiac medications before your next appointment, please call your pharmacy*  Lab Work: If you have labs (blood work) drawn today and your tests are completely normal, you will receive your results only by: MyChart Message (if you have MyChart) OR A paper copy in the mail If you have any lab test that is abnormal or we need to change your treatment, we will call you to review the results.  Testing/Procedures: None ordered today.  Follow-Up: At [facility], you and your health needs are our priority.  As part of our continuing mission to provide you with exceptional heart care, we have created designated Provider Care Teams.  These Care Teams include your primary Cardiologist (physician) and Advanced Practice Providers (APPs -  Physician Assistants and Nurse Practitioners) who all work together to provide you with the care you need, when you need it.  We recommend signing up for the patient portal called "MyChart".  Sign up information is provided on this After Visit Summary.  MyChart is used to connect with patients for Virtual Visits (Telemedicine).  Patients are able to view lab/test results, encounter notes, upcoming appointments, etc.  Non-urgent messages can be sent to your provider as well.   To learn more about what you can do with MyChart, go to [email or web address].    Your next appointment:   6 month(s)  Provider:   Peter Nishan, MD      

## 2023-05-06 ENCOUNTER — Ambulatory Visit: Payer: Medicare Other | Admitting: Pulmonary Disease

## 2023-05-06 ENCOUNTER — Encounter: Payer: Self-pay | Admitting: Pulmonary Disease

## 2023-05-06 VITALS — BP 137/69 | HR 79 | Ht 63.0 in | Wt 150.0 lb

## 2023-05-06 DIAGNOSIS — R911 Solitary pulmonary nodule: Secondary | ICD-10-CM

## 2023-05-06 DIAGNOSIS — I350 Nonrheumatic aortic (valve) stenosis: Secondary | ICD-10-CM

## 2023-05-06 NOTE — Patient Instructions (Signed)
VISIT SUMMARY:  During today's visit, we discussed your persistent cough and phlegm production, which you have been managing with home remedies. We also reviewed your history of lung nodules, recurrent bronchitis, aortic stenosis, and arthritis.  YOUR PLAN:  -LUNG NODULE: A lung nodule is a small growth in the lung that is usually benign. Your lung nodule has been stable since 2019, and no changes were noted on your most recent CT scan in December 2024. Therefore, no further follow-up CT scans are needed at this time.  -RECURRENT BRONCHITIS: Recurrent bronchitis is a condition where the bronchial tubes in the lungs become inflamed repeatedly. You have been managing your symptoms with home remedies such as oregano oil, honey, and turmeric tea, which appear to be effective. We will continue with this management plan and follow up in 6 months to monitor your condition.  -AORTIC STENOSIS: Aortic stenosis is a narrowing of the aortic valve in the heart. You have a history of this condition with surgical intervention and residual leakage, but you have no current complaints. No specific plan is needed at this time, so continue with your current management.  -ARTHRITIS: Arthritis is a condition that causes inflammation and pain in the joints. You have been managing your symptoms by staying active, and you should continue with your current activity level and management strategies.  INSTRUCTIONS:  Please follow up in 6 months to monitor your recurrent bronchitis. Continue with your current home remedies and management strategies for all conditions.

## 2023-05-06 NOTE — Progress Notes (Signed)
[name]    [id number]    21-Sep-1935  Primary Care Physician:Gottschalk, Rozell Searing, DO  Referring Physician: Raliegh Ip, DO [address] [place],  [place] [address]  Chief complaint: Follow-up for lung nodules, chronic bronchitis  HPI: 88 y.o. who  has a past medical history of Chronic headaches, Fibromyalgia, GERD (gastroesophageal reflux disease), Hemorrhoids, History of kidney stones, Hyperlipidemia, Hypertension, Moderate aortic stenosis, NSAID long-term use (05/30/2013), OSA (obstructive sleep apnea) (CPAP INTOLERANT ), Osteoporosis, S/P TAVR (transcatheter aortic valve replacement) (10/19/2017), Severe aortic stenosis, and Spinal stenosis, multilevel.   Discussed the use of AI scribe software for clinical note transcription with the patient, who gave verbal consent to proceed.  The patient, with a history of lung nodules and aortic stenosis, presents with a persistent cough and phlegm production. She reports that these symptoms began late last year, initially diagnosed as bronchitis and treated with Z-Pak, Hycodan, prednisone. Despite this treatment, the symptoms have persisted, coming and going intermittently. She has been managing her symptoms with home remedies, including oregano oil, red onion with honey, and a tea made with turmeric and lemon. She reports that these remedies have been effective in managing her symptoms, particularly the oregano oil, which she takes at the onset of an 'attack.' She has a history of three instances of walking pneumonia and wonders if this could be related to her current symptoms. She also has a history of smoking, although she reports that she did not inhale and quit approximately 30 years ago.   Pets: 2 dogs Occupation: Used to work in Multimedia programmer Exposures: No mold, hot tub, Financial controller.  No feather pillows or comforters Smoking history: 4 to 5 cigarettes/day for about 5 years.  Quit at the age of 22 Travel history: Previously lived  in [place] in [place].  No significant recent travel history Relevant family history: No family history of lung disease   Outpatient Encounter Medications as of 05/06/2023  Medication Sig   acetaminophen (TYLENOL) 500 MG tablet Take 1,000 mg by mouth daily as needed for moderate pain or headache.   amLODipine (NORVASC) 2.5 MG tablet TAKE 1 TABLET BY MOUTH EVERY DAY   Ascorbic Acid (VITAMIN C) 1000 MG tablet Take 1,000 mg by mouth daily.   aspirin EC 81 MG tablet Take 1 tablet (81 mg total) by mouth daily.   azithromycin (ZITHROMAX Z-PAK) 250 MG tablet As directed   chlorproMAZINE (THORAZINE) 10 MG tablet Take 1 tablet (10 mg total) by mouth 3 (three) times daily as needed for hiccoughs.   clobetasol cream (TEMOVATE) 0.05 % APPLY 1 APPLICATION TOPICALLY 2 (TWO) TIMES DAILY AS NEEDED.   furosemide (LASIX) 20 MG tablet TAKE 1 TABLET BY MOUTH EVERY DAY   hydrALAZINE (APRESOLINE) 25 MG tablet Take 1 tablet (25 mg total) by mouth in the morning and at bedtime.   HYDROcodone bit-homatropine (HYCODAN) 5-1.5 MG/5ML syrup Take 5 mLs by mouth every 6 (six) hours as needed for cough.   Magnesium 500 MG CAPS Take by mouth.   metoprolol tartrate (LOPRESSOR) 50 MG tablet TAKE 1 TABLET BY MOUTH TWICE A DAY   mupirocin ointment (BACTROBAN) 2 % Apply to the affected area twice daily for 7-10 days   nabumetone (RELAFEN) 500 MG tablet Take 2 tablets (1,000 mg total) by mouth 2 (two) times daily. For muscle and joint pain   olmesartan-hydrochlorothiazide (BENICAR HCT) 20-12.5 MG tablet Take 1 tablet by mouth daily.   OVER THE COUNTER MEDICATION  Take 1 capsule by mouth 2 (two) times daily. Hemp Oil   Rimegepant Sulfate (NURTEC) 75 MG TBDP Take 1 tablet (75 mg total) by mouth daily as needed (migraine).   rosuvastatin (CRESTOR) 10 MG tablet Take 1 tablet (10 mg total) by mouth daily. Pt needs to keep upcoming appt in Jan, 2025 for additional refills   trolamine salicylate (BLUE-EMU HEMP) 10 % cream Apply 1  Application topically as needed for muscle pain.   No facility-administered encounter medications on file as of 05/06/2023.    Allergies as of 05/06/2023 - Review Complete 05/06/2023  Allergen Reaction Noted   Tape Other (See Comments) 09/07/2012   Prolia [denosumab] Other (See Comments) 08/05/2017   Physical Exam: Blood pressure 137/69, pulse 79, height 5\' 3"  (1.6 m), weight 150 lb (68 kg), SpO2 99%. Gen:      No acute distress HEENT:  EOMI, sclera anicteric Neck:     No masses; no thyromegaly Lungs:    Clear to auscultation bilaterally; normal respiratory effort CV:         Regular rate and rhythm; no murmurs Abd:      + bowel sounds; soft, non-tender; no palpable masses, no distension Ext:    No edema; adequate peripheral perfusion Skin:      Warm and dry; no rash Neuro: alert and oriented x 3 Psych: normal mood and affect  Data Reviewed: Imaging: CT chest 03/16/2023 Right upper lobe and basal pulmonary nodule measuring 7 mm.  Stable since 2019 Images reviewed.  PFTs: 10/11/2017 FVC 2.28 [96%], FEV1 1.82 (103%], F/F80, TLC 5.84 [122%], DLCO 16.37 []  Mild obstructive airway disease  Labs:  Assessment and Plan Lung Nodule Stable since 2019, likely benign and possibly related to past pneumonia. No changes noted on most recent CT scan in December 2024. -No further follow-up CT scans needed due to stability of nodule.  Recurrent Bronchitis Self-reported recurrent episodes of bronchitis treated with antibiotics and steroids. Currently asymptomatic and managing symptoms with home remedies including oregano oil, honey, and turmeric tea. -Continue current management as it appears to be effective. -Follow-up in 6 months to monitor condition.  Aortic Stenosis History of aortic stenosis with surgical intervention and residual leakage. No current complaints related to this condition. -No specific plan needed at this time, continue current management.  Arthritis Chronic condition,  patient remains active to manage symptoms. -Continue current activity level and management strategies.   Recommendations: Follow up in 6 months  Chilton Greathouse MD  Pulmonary and Critical Care 05/06/2023, 11:19 AM  CC: Raliegh Ip, DO

## 2023-05-13 ENCOUNTER — Encounter: Payer: Self-pay | Admitting: Family Medicine

## 2023-05-13 ENCOUNTER — Ambulatory Visit (INDEPENDENT_AMBULATORY_CARE_PROVIDER_SITE_OTHER): Payer: Medicare Other | Admitting: Family Medicine

## 2023-05-13 VITALS — BP 168/54 | HR 91 | Temp 98.5°F | Ht 63.0 in | Wt 150.0 lb

## 2023-05-13 DIAGNOSIS — I10 Essential (primary) hypertension: Secondary | ICD-10-CM | POA: Diagnosis not present

## 2023-05-13 DIAGNOSIS — M25512 Pain in left shoulder: Secondary | ICD-10-CM | POA: Diagnosis not present

## 2023-05-13 MED ORDER — METHYLPREDNISOLONE ACETATE 40 MG/ML IJ SUSP
60.0000 mg | Freq: Once | INTRAMUSCULAR | Status: AC
  Administered 2023-05-13: 60 mg via INTRAMUSCULAR

## 2023-05-13 MED ORDER — PREDNISONE 20 MG PO TABS: 40.0000 mg | ORAL_TABLET | Freq: Every day | ORAL | 0 refills | Status: AC

## 2023-05-13 NOTE — Progress Notes (Signed)
Subjective:  Patient ID: [name], female    DOB: 08/01/1935, 88 y.o.   MRN: [id number]  Patient Care Team: Raliegh Ip, DO as PCP - General (Family Medicine) Wendall Stade, MD as PCP - Cardiology (Cardiology) Wendall Stade, MD as Consulting Physician (Cardiology) Louis Meckel, MD (Inactive) as Consulting Physician (Gastroenterology) Aplington, Illene Labrador, MD (Inactive) as Consulting Physician (Orthopedic Surgery) Milas Gain, MD as Referring Physician (Neurology) Jethro Bolus, MD as Consulting Physician (Ophthalmology)   Chief Complaint:  left shoulder pain  HPI: [name] is a 88 y.o. female presenting on 05/13/2023 for left shoulder pain States that it started months ago and was intermittent. States that sometimes it feels "stuck" and she was previously able to stretch and it would resolve. It has now been getting worse and not going away for ~1 week. She is taking nabumetone and tramadol. She is using heat and ice and it is not helping.  It is getting worse. States that she has popping when she moves it. She is trying to sleep with her arm raised up. Continues to stretch it. States that the pain goes down her arm and up to her neck.   Relevant past medical, surgical, family, and social history reviewed and updated as indicated.  Allergies and medications reviewed and updated. Data reviewed: Chart in Epic.   Past Medical History:  Diagnosis Date   Chronic headaches    Fibromyalgia    GERD (gastroesophageal reflux disease)    Hemorrhoids    INTERNAL--  POST BANDING 02/ 2015   History of kidney stones    Hyperlipidemia    Hypertension    Moderate aortic stenosis    NSAID long-term use 05/30/2013   OSA (obstructive sleep apnea) CPAP INTOLERANT    Osteoporosis    S/P TAVR (transcatheter aortic valve replacement) 10/19/2017   20 mm Edwards Sapien 3 transcatheter heart valve placed via percutaneous right transfemoral approach    Severe aortic stenosis     Spinal stenosis, multilevel     Past Surgical History:  Procedure Laterality Date   ANTERIOR CERVICAL DECOMP/DISCECTOMY FUSION  11-11-1999   C5 - C6   APPENDECTOMY  AGE 57   CARDIAC CATHETERIZATION  2008  DR [facility]   ESSENTIALLY NORMAL   CATARACT EXTRACTION Bilateral    CATARACT EXTRACTION W/ INTRAOCULAR LENS  IMPLANT, BILATERAL  2012   EYE SURGERY     FLEXIBLE SIGMOIDOSCOPY N/A 06/01/2013   Procedure: FLEXIBLE SIGMOIDOSCOPY;  Surgeon: Louis Meckel, MD;  Location: [facility];  Service: Endoscopy;  Laterality: N/A;  may need hemorrhoidal banding   HEMORRHOIDECTOMY WITH HEMORRHOID BANDING  08-07-2010   IR RADIOLOGY PERIPHERAL GUIDED IV START  06/27/2019   IR US GUIDE VASC ACCESS RIGHT  06/27/2019   LAPAROSCOPIC CHOLECYSTECTOMY  1990   RIGHT/LEFT HEART CATH AND CORONARY ANGIOGRAPHY N/A 09/29/2017   Procedure: RIGHT/LEFT HEART CATH AND CORONARY ANGIOGRAPHY;  Surgeon: Tonny Bollman, MD;  Location: [facility] INVASIVE CV LAB;  Service: Cardiovascular;  Laterality: N/A;   SHOULDER ARTHROSCOPY WITH OPEN ROTATOR CUFF REPAIR AND DISTAL CLAVICLE ACROMINECTOMY Right 05/25/2012   Procedure: SHOULDER ARTHROSCOPY WITH OPEN ROTATOR CUFF REPAIR AND DISTAL CLAVICLE ACROMINECTOMY;  Surgeon: Drucilla Schmidt, MD;  Location: [facility];  Service: Orthopedics;  Laterality: Right;  RIGHT SHOULDER ARTHROSCOPY WITH DERBRIDEMENTOF LABRAL/BICEP TENDON, OPEN DISTAL CLAVICLE RESECTION, ANTERIOR ACROMINECTOMY ROTATOR CUFF REPAIR ANESTHESIA: GENERAL/SCALENE NERVE BLOCK   TEE WITHOUT CARDIOVERSION Bilateral 10/19/2017   Procedure: TRANSESOPHAGEAL ECHOCARDIOGRAM (TEE);  Surgeon: Tonny Bollman, MD;  Location: [facility] OR;  Service: Open Heart Surgery;  Laterality: Bilateral;   TENOSYNOVECTOMY Left 01/04/2014   Procedure: LEFT WRIST EXTENSOR TENOSYNOVECTOMY;  Surgeon: Sharma Covert, MD;  Location: [facility];  Service: Orthopedics;  Laterality: Left;   THYROIDECTOMY  AGE 59   GOITER    TONSILLECTOMY AND ADENOIDECTOMY  AGE 25   TRANSCATHETER AORTIC VALVE REPLACEMENT, TRANSFEMORAL  10/19/2017   TRANSCATHETER AORTIC VALVE REPLACEMENT, TRANSFEMORAL Bilateral 10/19/2017   Procedure: TRANSCATHETER AORTIC VALVE REPLACEMENT, TRANSFEMORAL;  Surgeon: Tonny Bollman, MD;  Location: [facility] OR;  Service: Open Heart Surgery;  Laterality: Bilateral;   TRANSTHORACIC ECHOCARDIOGRAM  last one 04-20-2013  [facility]    NORMAL LVSF/ EF 60-65%/ MODERATE  AV  STENOSIS WITH NO AR /  MILD LAE   UMBILICAL HERNIA REPAIR  JUNE 2006   VAGINAL HYSTERECTOMY  01-31-2001   ANTERIOR & POSTERIOR REPAIR/ TRANSVAGINAL BLADDER SLING    Social History   Socioeconomic History   Marital status: Married    Spouse name: Ajiah Mcglinn   Number of children: 6   Years of education: 6   Highest education level: 8th grade  Occupational History   Occupation: Retired  Tobacco Use   Smoking status: Former    Current packs/day: 0.00    Average packs/day: 0.3 packs/day for 5.0 years (1.3 ttl pk-yrs)    Types: Cigarettes    Start date: 04/13/1998    Quit date: 04/14/2003    Years since quitting: 20.0    Passive exposure: Past   Smokeless tobacco: Never  Vaping Use   Vaping status: Never Used  Substance and Sexual Activity   Alcohol use: Yes    Comment: once in a while-social   Drug use: Never    Comment: hemp oil    Sexual activity: Not Currently    Partners: Male  Other Topics Concern   Not on file  Social History Narrative   Not on file   Social Drivers of Health   Financial Resource Strain: Low Risk  (11/22/2022)   Overall Financial Resource Strain (CARDIA)    Difficulty of Paying Living Expenses: Not hard at all  Food Insecurity: No Food Insecurity (11/22/2022)   Hunger Vital Sign    Worried About Running Out of Food in the Last Year: Never true    Ran Out of Food in the Last Year: Never true  Transportation Needs: No Transportation Needs (11/22/2022)   PRAPARE - Administrator, Civil Service  (Medical): No    Lack of Transportation (Non-Medical): No  Physical Activity: Inactive (11/22/2022)   Exercise Vital Sign    Days of Exercise per Week: 0 days    Minutes of Exercise per Session: 0 min  Stress: No Stress Concern Present (11/22/2022)   [employer] of Occupational Health - Occupational Stress Questionnaire    Feeling of Stress : Not at all  Social Connections: Moderately Integrated (11/22/2022)   Social Connection and Isolation Panel [NHANES]    Frequency of Communication with Friends and Family: More than three times a week    Frequency of Social Gatherings with Friends and Family: More than three times a week    Attends Religious Services: 1 to 4 times per year    Active Member of [employer] or Organizations: No    Attends Banker Meetings: Never    Marital Status: Married  Catering manager Violence: Not At Risk (08/18/2022)   Humiliation, Afraid, Rape, and Kick questionnaire  Fear of Current or Ex-Partner: No    Emotionally Abused: No    Physically Abused: No    Sexually Abused: No    Outpatient Encounter Medications as of 05/13/2023  Medication Sig   acetaminophen (TYLENOL) 500 MG tablet Take 1,000 mg by mouth daily as needed for moderate pain or headache.   amLODipine (NORVASC) 2.5 MG tablet TAKE 1 TABLET BY MOUTH EVERY DAY   Ascorbic Acid (VITAMIN C) 1000 MG tablet Take 1,000 mg by mouth daily.   aspirin EC 81 MG tablet Take 1 tablet (81 mg total) by mouth daily.   chlorproMAZINE (THORAZINE) 10 MG tablet Take 1 tablet (10 mg total) by mouth 3 (three) times daily as needed for hiccoughs.   clobetasol cream (TEMOVATE) 0.05 % APPLY 1 APPLICATION TOPICALLY 2 (TWO) TIMES DAILY AS NEEDED.   furosemide (LASIX) 20 MG tablet TAKE 1 TABLET BY MOUTH EVERY DAY   hydrALAZINE (APRESOLINE) 25 MG tablet Take 1 tablet (25 mg total) by mouth in the morning and at bedtime.   HYDROcodone bit-homatropine (HYCODAN) 5-1.5 MG/5ML syrup Take 5 mLs by mouth every 6 (six) hours  as needed for cough.   Magnesium 500 MG CAPS Take by mouth.   metoprolol tartrate (LOPRESSOR) 50 MG tablet TAKE 1 TABLET BY MOUTH TWICE A DAY   mupirocin ointment (BACTROBAN) 2 % Apply to the affected area twice daily for 7-10 days   nabumetone (RELAFEN) 500 MG tablet Take 2 tablets (1,000 mg total) by mouth 2 (two) times daily. For muscle and joint pain   olmesartan-hydrochlorothiazide (BENICAR HCT) 20-12.5 MG tablet Take 1 tablet by mouth daily.   OVER THE COUNTER MEDICATION Take 1 capsule by mouth 2 (two) times daily. Hemp Oil   Rimegepant Sulfate (NURTEC) 75 MG TBDP Take 1 tablet (75 mg total) by mouth daily as needed (migraine).   rosuvastatin (CRESTOR) 10 MG tablet Take 1 tablet (10 mg total) by mouth daily. Pt needs to keep upcoming appt in Jan, 2025 for additional refills   trolamine salicylate (BLUE-EMU HEMP) 10 % cream Apply 1 Application topically as needed for muscle pain.   [DISCONTINUED] azithromycin (ZITHROMAX Z-PAK) 250 MG tablet As directed   No facility-administered encounter medications on file as of 05/13/2023.    Allergies  Allergen Reactions   Tape Other (See Comments)    Dermatitis rash "with extended exposure"   Prolia [Denosumab] Other (See Comments)    Arthralgia/ myalgia/ jaw pain/ headache    Review of Systems As per HPI  Objective:  BP (!) 168/54   Pulse 91   Temp 98.5 F (36.9 C)   Ht 5\' 3"  (1.6 m)   Wt 150 lb (68 kg)   SpO2 97%   BMI 26.57 kg/m    Wt Readings from Last 3 Encounters:  05/13/23 150 lb (68 kg)  05/06/23 150 lb (68 kg)  03/26/23 146 lb (66.2 kg)   Physical Exam Constitutional:      General: She is awake. She is not in acute distress.    Appearance: Normal appearance. She is well-developed and well-groomed. She is not ill-appearing, toxic-appearing or diaphoretic.  Cardiovascular:     Rate and Rhythm: Normal rate and regular rhythm.     Pulses: Normal pulses.          Radial pulses are 2+ on the right side and 2+ on the left  side.       Posterior tibial pulses are 2+ on the right side and 2+ on the left side.  Heart sounds: Normal heart sounds. No murmur heard.    No gallop.  Pulmonary:     Effort: Pulmonary effort is normal. No respiratory distress.     Breath sounds: Normal breath sounds. No stridor. No wheezing, rhonchi or rales.  Musculoskeletal:     Left shoulder: Tenderness and crepitus present. No swelling, deformity, effusion, laceration or bony tenderness. Decreased range of motion. Normal strength. Normal pulse.     Cervical back: Full passive range of motion without pain and neck supple.     Right lower leg: No edema.     Left lower leg: No edema.     Comments: Negative empty can test  Pain on forward flexion and abduction  Skin:    General: Skin is warm.     Capillary Refill: Capillary refill takes less than 2 seconds.  Neurological:     General: No focal deficit present.     Mental Status: She is alert, oriented to person, place, and time and easily aroused. Mental status is at baseline.     GCS: GCS eye subscore is 4. GCS verbal subscore is 5. GCS motor subscore is 6.     Motor: No weakness.  Psychiatric:        Attention and Perception: Attention and perception normal.        Mood and Affect: Mood and affect normal.        Speech: Speech normal.        Behavior: Behavior normal. Behavior is cooperative.        Thought Content: Thought content normal. Thought content does not include homicidal or suicidal ideation. Thought content does not include homicidal or suicidal plan.        Cognition and Memory: Cognition and memory normal.        Judgment: Judgment normal.     Results for orders placed or performed in visit on 03/23/23  Surgical pathology   Collection Time: 03/23/23 12:00 AM  Result Value Ref Range   SURGICAL PATHOLOGY      SURGICAL PATHOLOGY [facility] [address], [address] [place], [place] [address] Telephone [phone] or [phone] Fax  [phone]  REPORT OF DERMATOPATHOLOGY   Accession #: [id number] Patient Name: DEZIRAE, SERVICE Visit # : [id number]  MRN: [id number] Cytotechnologist: Munoz-Bishop Md, Efraim Kaufmann, Dermatopathologist, Electronic Signature DOB/Age [birth year] (Age: 43) Gender: F Collected Date: 03/23/2023 Received Date: 03/23/2023  FINAL DIAGNOSIS       1. Skin, right malar cheek :       SEBORRHEIC KERATOSIS, INFLAMED       DATE SIGNED OUT: 03/25/2023 ELECTRONIC SIGNATURE : Munoz-Bishop Md, Melissa, Dermatopathologist, Electronic Signature  MICROSCOPIC DESCRIPTION 1. There is hyperplasia of the epidermis with a basosquamous proliferation.  There is slight pattern of keratinocyte disorganization within the epidermis associated with a slight degree of atypia.  There is a lichenoid inflammatory reaction pattern.  From this histology  this is a seborrheic keratosis with a lichenoid reaction pattern.  CASE COMMENTS STAINS USED IN DIAGNOSIS: H&E    CLINICAL HISTORY  SPECIMEN(S) OBTAINED 1. Skin, Right Malar Cheek  SPECIMEN COMMENTS: 1. 7 mm crusted papule on erythematous base SPECIMEN CLINICAL INFORMATION: 1. Neoplasm of uncertain behavior of skin, R/O ISK vs NMSC    Gross Description 1. Formalin fixed specimen received:  7 X 5 X 3 MM, TOTO (3 P) (1 B) ( AE )        Report signed out from the following location(s) [place]. [facility]  [address], [place] [address] CLIA #: [id number]  [facility] [address] [place], [place] [address] CLIA #: [id number]        05/13/2023   10:43 AM 01/11/2023    9:40 AM 12/28/2022    1:35 PM 12/28/2022    1:28 PM 11/25/2022    2:57 PM  Depression screen PHQ 2/9  Decreased Interest 0 0 0 0 0  Down, Depressed, Hopeless 0 0 0 0 0  PHQ - 2 Score 0 0 0 0 0  Altered sleeping 1 0 0    Tired, decreased energy 1 0 1    Change in appetite 0 1 0    Feeling bad or failure about yourself  0 0 0    Trouble  concentrating 0 0 0    Moving slowly or fidgety/restless 0 0 3    Suicidal thoughts 0 0 0    PHQ-9 Score 2 1 4     Difficult doing work/chores Somewhat difficult Not difficult at all Somewhat difficult         05/13/2023   10:43 AM 12/28/2022    1:35 PM 11/23/2022   10:44 AM 11/12/2022   11:05 AM  GAD 7 : Generalized Anxiety Score  Nervous, Anxious, on Edge 0 2 0 0  Control/stop worrying 0 1 0 0  Worry too much - different things 0 0 0 0  Trouble relaxing 1 0 0 1  Restless 0 0 0 0  Easily annoyed or irritable 1 1 0 0  Afraid - awful might happen 0 0 0 0  Total GAD 7 Score 2 4 0 1  Anxiety Difficulty  Not difficult at all     Pertinent labs & imaging results that were available during my care of the patient were reviewed by me and considered in my medical decision making.  Assessment & Plan:  Kimarie was seen today for left shoulder pain.  Diagnoses and all orders for this visit:  Acute pain of left shoulder Will provide mediation and injection as below. Discussed with patient to limit use of NSAIDs given cardiac history. Patient to continue stretches and mobility exercises. Continue to use heat and ice as needed for pain.  -     predniSONE (DELTASONE) 20 MG tablet; Take 2 tablets (40 mg total) by mouth daily with breakfast for 5 days. -     methylPREDNISolone acetate (DEPO-MEDROL) injection 60 mg  Essential hypertension Elevated BP in office. Discussed with patient monitoring at home and bringing measurements to follow up appt with PCP and cardiology     Continue all other maintenance medications.  Follow up plan: Return if symptoms worsen or fail to improve.   Continue healthy lifestyle choices, including diet (rich in fruits, vegetables, and lean proteins, and low in salt and simple carbohydrates) and exercise (at least 30 minutes of moderate physical activity daily).  Written and verbal instructions provided   The above assessment and management plan was discussed with the  patient. The patient verbalized understanding of and has agreed to the management plan. Patient is aware to call the clinic if they develop any new symptoms or if symptoms persist or worsen. Patient is aware when to return to the clinic for a follow-up visit. Patient educated on when it is appropriate to go to the emergency department.   Neale Burly, DNP-FNP [facility] [address] [place], [place] [address] [phone]

## 2023-05-17 ENCOUNTER — Ambulatory Visit: Payer: Medicare Other | Admitting: Pulmonary Disease

## 2023-06-19 ENCOUNTER — Encounter (HOSPITAL_COMMUNITY): Payer: Self-pay

## 2023-06-19 ENCOUNTER — Other Ambulatory Visit: Payer: Self-pay

## 2023-06-19 ENCOUNTER — Inpatient Hospital Stay (HOSPITAL_COMMUNITY)
Admission: EM | Admit: 2023-06-19 | Discharge: 2023-06-24 | DRG: 193 | Disposition: A | Discharge status: Home / Self Care | Attending: Family Medicine | Admitting: Family Medicine

## 2023-06-19 ENCOUNTER — Ambulatory Visit
Admission: EM | Admit: 2023-06-19 | Discharge: 2023-06-19 | Disposition: A | Discharge status: Home / Self Care | Attending: Family Medicine | Admitting: Family Medicine

## 2023-06-19 ENCOUNTER — Emergency Department (HOSPITAL_COMMUNITY)

## 2023-06-19 DIAGNOSIS — J180 Bronchopneumonia, unspecified organism: Secondary | ICD-10-CM | POA: Diagnosis present

## 2023-06-19 DIAGNOSIS — K219 Gastro-esophageal reflux disease without esophagitis: Secondary | ICD-10-CM | POA: Diagnosis not present

## 2023-06-19 DIAGNOSIS — Z87891 Personal history of nicotine dependence: Secondary | ICD-10-CM | POA: Diagnosis not present

## 2023-06-19 DIAGNOSIS — Z981 Arthrodesis status: Secondary | ICD-10-CM | POA: Diagnosis not present

## 2023-06-19 DIAGNOSIS — I350 Nonrheumatic aortic (valve) stenosis: Secondary | ICD-10-CM

## 2023-06-19 DIAGNOSIS — R051 Acute cough: Secondary | ICD-10-CM | POA: Diagnosis not present

## 2023-06-19 DIAGNOSIS — Z87442 Personal history of urinary calculi: Secondary | ICD-10-CM

## 2023-06-19 DIAGNOSIS — E222 Syndrome of inappropriate secretion of antidiuretic hormone: Secondary | ICD-10-CM | POA: Diagnosis present

## 2023-06-19 DIAGNOSIS — R0600 Dyspnea, unspecified: Secondary | ICD-10-CM | POA: Diagnosis not present

## 2023-06-19 DIAGNOSIS — Z90710 Acquired absence of both cervix and uterus: Secondary | ICD-10-CM

## 2023-06-19 DIAGNOSIS — M797 Fibromyalgia: Secondary | ICD-10-CM | POA: Diagnosis not present

## 2023-06-19 DIAGNOSIS — Z1152 Encounter for screening for COVID-19: Secondary | ICD-10-CM

## 2023-06-19 DIAGNOSIS — R531 Weakness: Secondary | ICD-10-CM

## 2023-06-19 DIAGNOSIS — Z953 Presence of xenogenic heart valve: Secondary | ICD-10-CM

## 2023-06-19 DIAGNOSIS — M810 Age-related osteoporosis without current pathological fracture: Secondary | ICD-10-CM | POA: Diagnosis not present

## 2023-06-19 DIAGNOSIS — E871 Hypo-osmolality and hyponatremia: Secondary | ICD-10-CM | POA: Diagnosis not present

## 2023-06-19 DIAGNOSIS — I080 Rheumatic disorders of both mitral and aortic valves: Secondary | ICD-10-CM | POA: Diagnosis present

## 2023-06-19 DIAGNOSIS — I130 Hypertensive heart and chronic kidney disease with heart failure and stage 1 through stage 4 chronic kidney disease, or unspecified chronic kidney disease: Secondary | ICD-10-CM | POA: Diagnosis present

## 2023-06-19 DIAGNOSIS — R0902 Hypoxemia: Secondary | ICD-10-CM

## 2023-06-19 DIAGNOSIS — Z888 Allergy status to other drugs, medicaments and biological substances status: Secondary | ICD-10-CM

## 2023-06-19 DIAGNOSIS — Z79899 Other long term (current) drug therapy: Secondary | ICD-10-CM

## 2023-06-19 DIAGNOSIS — D72823 Leukemoid reaction: Secondary | ICD-10-CM | POA: Diagnosis present

## 2023-06-19 DIAGNOSIS — Z91048 Other nonmedicinal substance allergy status: Secondary | ICD-10-CM

## 2023-06-19 DIAGNOSIS — I509 Heart failure, unspecified: Secondary | ICD-10-CM

## 2023-06-19 DIAGNOSIS — R0602 Shortness of breath: Secondary | ICD-10-CM | POA: Diagnosis not present

## 2023-06-19 DIAGNOSIS — J1008 Influenza due to other identified influenza virus with other specified pneumonia: Principal | ICD-10-CM | POA: Diagnosis present

## 2023-06-19 DIAGNOSIS — J129 Viral pneumonia, unspecified: Secondary | ICD-10-CM | POA: Diagnosis present

## 2023-06-19 DIAGNOSIS — E782 Mixed hyperlipidemia: Secondary | ICD-10-CM | POA: Diagnosis present

## 2023-06-19 DIAGNOSIS — Z800 Family history of malignant neoplasm of digestive organs: Secondary | ICD-10-CM

## 2023-06-19 DIAGNOSIS — R918 Other nonspecific abnormal finding of lung field: Secondary | ICD-10-CM | POA: Diagnosis not present

## 2023-06-19 DIAGNOSIS — R7989 Other specified abnormal findings of blood chemistry: Secondary | ICD-10-CM | POA: Insufficient documentation

## 2023-06-19 DIAGNOSIS — J9601 Acute respiratory failure with hypoxia: Secondary | ICD-10-CM | POA: Diagnosis not present

## 2023-06-19 DIAGNOSIS — E890 Postprocedural hypothyroidism: Secondary | ICD-10-CM | POA: Diagnosis present

## 2023-06-19 DIAGNOSIS — I5033 Acute on chronic diastolic (congestive) heart failure: Secondary | ICD-10-CM | POA: Diagnosis not present

## 2023-06-19 DIAGNOSIS — J111 Influenza due to unidentified influenza virus with other respiratory manifestations: Principal | ICD-10-CM

## 2023-06-19 DIAGNOSIS — I10 Essential (primary) hypertension: Secondary | ICD-10-CM | POA: Diagnosis not present

## 2023-06-19 DIAGNOSIS — Z952 Presence of prosthetic heart valve: Secondary | ICD-10-CM

## 2023-06-19 DIAGNOSIS — I2489 Other forms of acute ischemic heart disease: Secondary | ICD-10-CM | POA: Diagnosis present

## 2023-06-19 DIAGNOSIS — Z7982 Long term (current) use of aspirin: Secondary | ICD-10-CM

## 2023-06-19 DIAGNOSIS — N1831 Chronic kidney disease, stage 3a: Secondary | ICD-10-CM | POA: Diagnosis present

## 2023-06-19 DIAGNOSIS — J811 Chronic pulmonary edema: Secondary | ICD-10-CM | POA: Insufficient documentation

## 2023-06-19 DIAGNOSIS — J09X1 Influenza due to identified novel influenza A virus with pneumonia: Secondary | ICD-10-CM | POA: Insufficient documentation

## 2023-06-19 DIAGNOSIS — I110 Hypertensive heart disease with heart failure: Secondary | ICD-10-CM | POA: Diagnosis not present

## 2023-06-19 DIAGNOSIS — Z8249 Family history of ischemic heart disease and other diseases of the circulatory system: Secondary | ICD-10-CM

## 2023-06-19 DIAGNOSIS — R000 Tachycardia, unspecified: Secondary | ICD-10-CM | POA: Diagnosis present

## 2023-06-19 DIAGNOSIS — G4733 Obstructive sleep apnea (adult) (pediatric): Secondary | ICD-10-CM | POA: Diagnosis present

## 2023-06-19 LAB — CBC WITH DIFFERENTIAL/PLATELET
Abs Immature Granulocytes: 0.04 10*3/uL (ref 0.00–0.07)
Basophils Absolute: 0 10*3/uL (ref 0.0–0.1)
Basophils Relative: 0 %
Eosinophils Absolute: 0 10*3/uL (ref 0.0–0.5)
Eosinophils Relative: 0 %
HCT: 35.7 % — ABNORMAL LOW (ref 36.0–46.0)
Hemoglobin: 12 g/dL (ref 12.0–15.0)
Immature Granulocytes: 0 %
Lymphocytes Relative: 16 %
Lymphs Abs: 1.6 10*3/uL (ref 0.7–4.0)
MCH: 30.2 pg (ref 26.0–34.0)
MCHC: 33.6 g/dL (ref 30.0–36.0)
MCV: 89.7 fL (ref 80.0–100.0)
Monocytes Absolute: 1.1 10*3/uL — ABNORMAL HIGH (ref 0.1–1.0)
Monocytes Relative: 10 %
Neutro Abs: 7.5 10*3/uL (ref 1.7–7.7)
Neutrophils Relative %: 74 %
Platelets: 244 10*3/uL (ref 150–400)
RBC: 3.98 MIL/uL (ref 3.87–5.11)
RDW: 12.6 % (ref 11.5–15.5)
WBC: 10.2 10*3/uL (ref 4.0–10.5)
nRBC: 0 % (ref 0.0–0.2)

## 2023-06-19 LAB — RESP PANEL BY RT-PCR (RSV, FLU A&B, COVID)  RVPGX2
Influenza A by PCR: POSITIVE — AB
Influenza B by PCR: NEGATIVE
Resp Syncytial Virus by PCR: NEGATIVE
SARS Coronavirus 2 by RT PCR: NEGATIVE

## 2023-06-19 LAB — COMPREHENSIVE METABOLIC PANEL
ALT: 32 U/L (ref 0–44)
AST: 48 U/L — ABNORMAL HIGH (ref 15–41)
Albumin: 3.1 g/dL — ABNORMAL LOW (ref 3.5–5.0)
Alkaline Phosphatase: 49 U/L (ref 38–126)
Anion gap: 11 (ref 5–15)
BUN: 21 mg/dL (ref 8–23)
CO2: 23 mmol/L (ref 22–32)
Calcium: 8.7 mg/dL — ABNORMAL LOW (ref 8.9–10.3)
Chloride: 94 mmol/L — ABNORMAL LOW (ref 98–111)
Creatinine, Ser: 1 mg/dL (ref 0.44–1.00)
GFR, Estimated: 55 mL/min — ABNORMAL LOW (ref >60)
Glucose, Bld: 109 mg/dL — ABNORMAL HIGH (ref 70–99)
Potassium: 4.4 mmol/L (ref 3.5–5.1)
Sodium: 128 mmol/L — ABNORMAL LOW (ref 135–145)
Total Bilirubin: 0.7 mg/dL (ref 0.0–1.2)
Total Protein: 7 g/dL (ref 6.5–8.1)

## 2023-06-19 LAB — LACTIC ACID, PLASMA: Lactic Acid, Venous: 1.5 mmol/L (ref 0.5–1.9)

## 2023-06-19 LAB — TROPONIN I (HIGH SENSITIVITY)
Troponin I (High Sensitivity): 384 ng/L (ref <18)
Troponin I (High Sensitivity): 333 ng/L (ref <18)
Troponin I (High Sensitivity): 221 ng/L (ref <18)

## 2023-06-19 LAB — BRAIN NATRIURETIC PEPTIDE: B Natriuretic Peptide: 1203 pg/mL — ABNORMAL HIGH (ref 0.0–100.0)

## 2023-06-19 MED ORDER — OSELTAMIVIR PHOSPHATE 75 MG PO CAPS: 75.0000 mg | ORAL_CAPSULE | Freq: Two times a day (BID) | ORAL | Status: DC

## 2023-06-19 MED ORDER — HYDRALAZINE HCL 25 MG PO TABS
25.0000 mg | ORAL_TABLET | Freq: Two times a day (BID) | ORAL | Status: DC
  Administered 2023-06-19: 25 mg via ORAL
  Filled 2023-06-19: qty 1

## 2023-06-19 MED ORDER — FUROSEMIDE 10 MG/ML IJ SOLN
40.0000 mg | Freq: Once | INTRAMUSCULAR | Status: AC
  Administered 2023-06-19: 40 mg via INTRAVENOUS
  Filled 2023-06-19: qty 4

## 2023-06-19 MED ORDER — ALBUTEROL SULFATE (2.5 MG/3ML) 0.083% IN NEBU
2.5000 mg | INHALATION_SOLUTION | Freq: Once | RESPIRATORY_TRACT | Status: AC
Start: 2023-06-19 — End: 2023-06-19
  Administered 2023-06-19: 2.5 mg via RESPIRATORY_TRACT

## 2023-06-19 MED ORDER — NAPROXEN 250 MG PO TABS: 250.0000 mg | ORAL_TABLET | Freq: Three times a day (TID) | ORAL | Status: DC

## 2023-06-19 MED ORDER — ACETAMINOPHEN 500 MG PO TABS
1000.0000 mg | ORAL_TABLET | Freq: Once | ORAL | Status: AC
Start: 2023-06-19 — End: 2023-06-19
  Administered 2023-06-19: 1000 mg via ORAL
  Filled 2023-06-19: qty 2

## 2023-06-19 MED ORDER — ASPIRIN 81 MG PO TBEC
81.0000 mg | DELAYED_RELEASE_TABLET | Freq: Every day | ORAL | Status: DC
  Administered 2023-06-20 – 2023-06-24 (×5): 81 mg via ORAL
  Filled 2023-06-19 (×5): qty 1

## 2023-06-19 MED ORDER — ENOXAPARIN SODIUM 40 MG/0.4ML IJ SOSY
40.0000 mg | PREFILLED_SYRINGE | INTRAMUSCULAR | Status: DC
  Administered 2023-06-19 – 2023-06-20 (×2): 40 mg via SUBCUTANEOUS
  Filled 2023-06-19 (×2): qty 0.4

## 2023-06-19 MED ORDER — FUROSEMIDE 10 MG/ML IJ SOLN
60.0000 mg | Freq: Two times a day (BID) | INTRAMUSCULAR | Status: DC
  Filled 2023-06-19: qty 6

## 2023-06-19 MED ORDER — POTASSIUM CHLORIDE CRYS ER 20 MEQ PO TBCR
40.0000 meq | EXTENDED_RELEASE_TABLET | Freq: Two times a day (BID) | ORAL | Status: DC
  Administered 2023-06-19: 40 meq via ORAL
  Filled 2023-06-19: qty 4

## 2023-06-19 MED ORDER — CHLORHEXIDINE GLUCONATE CLOTH 2 % EX PADS
6.0000 | MEDICATED_PAD | Freq: Every day | CUTANEOUS | Status: DC
  Administered 2023-06-21: 6 via TOPICAL

## 2023-06-19 MED ORDER — IPRATROPIUM-ALBUTEROL 0.5-2.5 (3) MG/3ML IN SOLN
3.0000 mL | Freq: Four times a day (QID) | RESPIRATORY_TRACT | Status: DC
  Administered 2023-06-19 – 2023-06-20 (×5): 3 mL via RESPIRATORY_TRACT
  Filled 2023-06-19 (×5): qty 3

## 2023-06-19 MED ORDER — ROSUVASTATIN CALCIUM 10 MG PO TABS
10.0000 mg | ORAL_TABLET | Freq: Every day | ORAL | Status: DC
  Administered 2023-06-19 – 2023-06-24 (×6): 10 mg via ORAL
  Filled 2023-06-19 (×6): qty 1

## 2023-06-19 MED ORDER — ALBUTEROL SULFATE (2.5 MG/3ML) 0.083% IN NEBU
2.5000 mg | INHALATION_SOLUTION | RESPIRATORY_TRACT | Status: DC | PRN
  Administered 2023-06-19: 2.5 mg via RESPIRATORY_TRACT
  Filled 2023-06-19: qty 3

## 2023-06-19 MED ORDER — OSELTAMIVIR PHOSPHATE 75 MG PO CAPS
75.0000 mg | ORAL_CAPSULE | Freq: Once | ORAL | Status: AC
  Administered 2023-06-19: 75 mg via ORAL
  Filled 2023-06-19: qty 1

## 2023-06-19 MED ORDER — MAGNESIUM SULFATE 2 GM/50ML IV SOLN
2.0000 g | Freq: Once | INTRAVENOUS | Status: AC
  Administered 2023-06-19: 2 g via INTRAVENOUS
  Filled 2023-06-19: qty 50

## 2023-06-19 MED ORDER — OSELTAMIVIR PHOSPHATE 30 MG PO CAPS
30.0000 mg | ORAL_CAPSULE | Freq: Two times a day (BID) | ORAL | Status: DC
  Administered 2023-06-20 – 2023-06-24 (×9): 30 mg via ORAL
  Filled 2023-06-19 (×9): qty 1

## 2023-06-19 MED ORDER — ACETAMINOPHEN 325 MG PO TABS
650.0000 mg | ORAL_TABLET | Freq: Four times a day (QID) | ORAL | Status: DC | PRN
  Administered 2023-06-19 – 2023-06-21 (×4): 650 mg via ORAL
  Filled 2023-06-19 (×5): qty 2

## 2023-06-19 MED ORDER — METOPROLOL TARTRATE 50 MG PO TABS
50.0000 mg | ORAL_TABLET | Freq: Two times a day (BID) | ORAL | Status: DC
  Administered 2023-06-19: 50 mg via ORAL
  Filled 2023-06-19: qty 1

## 2023-06-19 MED ORDER — IPRATROPIUM-ALBUTEROL 0.5-2.5 (3) MG/3ML IN SOLN
3.0000 mL | Freq: Once | RESPIRATORY_TRACT | Status: AC
  Administered 2023-06-19: 3 mL via RESPIRATORY_TRACT
  Filled 2023-06-19: qty 3

## 2023-06-19 MED ORDER — GUAIFENESIN ER 600 MG PO TB12
600.0000 mg | ORAL_TABLET | Freq: Two times a day (BID) | ORAL | Status: DC
  Administered 2023-06-19 – 2023-06-24 (×10): 600 mg via ORAL
  Filled 2023-06-19 (×10): qty 1

## 2023-06-19 NOTE — ED Triage Notes (Signed)
 Pt BIB daughter from home due to SOB x 1week. Pt's daughter stated that she has been feeling under the weather for a week but got much worse yesterday. Went to UC today and was sent here due to hypoxia. Pt sounds very wet with breathing

## 2023-06-19 NOTE — ED Provider Notes (Signed)
 [place] EMERGENCY DEPARTMENT AT [facility] Provider Note   CSN: [id number] Arrival date & time: 06/19/23  1338     History  Chief Complaint  Patient presents with   Shortness of Breath    [name] is a 88 y.o. female.  She has a history of aortic valve replacement hypertension.  She has been sick for about a week.  Headache body aches cough shortness of breath.  Does not think she has had a fever.  Symptoms have been getting progressively worse.  Went to urgent care today who immediately sent her here for further evaluation.  She does not normally have any respiratory issues other than some nodules that they are following.  Non-smoker.  The history is provided by the patient.  Shortness of Breath Severity:  Severe Onset quality:  Gradual Duration:  1 week Timing:  Constant Progression:  Worsening Chronicity:  New Relieved by:  Nothing Worsened by:  Activity and coughing Ineffective treatments:  Rest Associated symptoms: abdominal pain, chest pain, cough and sputum production   Associated symptoms: no fever and no hemoptysis   Risk factors: no tobacco use        Home Medications Prior to Admission medications   Medication Sig Start Date End Date Taking? Authorizing Provider  acetaminophen (TYLENOL) 500 MG tablet Take 1,000 mg by mouth daily as needed for moderate pain or headache.    [provider]  amLODipine (NORVASC) 2.5 MG tablet TAKE 1 TABLET BY MOUTH EVERY DAY 12/18/22   Chilton Si, MD  Ascorbic Acid (VITAMIN C) 1000 MG tablet Take 1,000 mg by mouth daily.    [provider]  aspirin EC 81 MG tablet Take 1 tablet (81 mg total) by mouth daily. 01/12/18   Raliegh Ip, DO  chlorproMAZINE (THORAZINE) 10 MG tablet Take 1 tablet (10 mg total) by mouth 3 (three) times daily as needed for hiccoughs. 11/25/22   Mechele Claude, MD  clobetasol cream (TEMOVATE) 0.05 % APPLY 1 APPLICATION TOPICALLY 2 (TWO) TIMES DAILY AS NEEDED. 07/01/18    Delynn Flavin M, DO  furosemide (LASIX) 20 MG tablet TAKE 1 TABLET BY MOUTH EVERY DAY 12/18/22   Wendall Stade, MD  hydrALAZINE (APRESOLINE) 25 MG tablet Take 1 tablet (25 mg total) by mouth in the morning and at bedtime. 05/05/22   Chilton Si, MD  HYDROcodone bit-homatropine [facility]) 5-1.5 MG/5ML syrup Take 5 mLs by mouth every 6 (six) hours as needed for cough. 03/26/23   Daphine Deutscher Mary-Margaret, FNP  Magnesium 500 MG CAPS Take by mouth.    [provider]  metoprolol tartrate (LOPRESSOR) 50 MG tablet TAKE 1 TABLET BY MOUTH TWICE A DAY 12/18/22   Wendall Stade, MD  mupirocin ointment (BACTROBAN) 2 % Apply to the affected area twice daily for 7-10 days 12/27/18   Raliegh Ip, DO  nabumetone (RELAFEN) 500 MG tablet Take 2 tablets (1,000 mg total) by mouth 2 (two) times daily. For muscle and joint pain 12/28/22   Mechele Claude, MD  olmesartan-hydrochlorothiazide (BENICAR HCT) 20-12.5 MG tablet Take 1 tablet by mouth daily. 01/05/23   Chilton Si, MD  OVER THE COUNTER MEDICATION Take 1 capsule by mouth 2 (two) times daily. Hemp Oil    [provider]  Rimegepant Sulfate (NURTEC) 75 MG TBDP Take 1 tablet (75 mg total) by mouth daily as needed (migraine). 01/11/23   Gabriel Earing, FNP  rosuvastatin (CRESTOR) 10 MG tablet Take 1 tablet (10 mg total) by mouth  daily. Pt needs to keep upcoming appt in Jan, 2025 for additional refills 04/22/23   Chilton Si, MD  trolamine salicylate (BLUE-EMU HEMP) 10 % cream Apply 1 Application topically as needed for muscle pain.    [provider]      Allergies    Tape and Prolia [denosumab]    Review of Systems   Review of Systems  Constitutional:  Negative for fever.  Respiratory:  Positive for cough, sputum production and shortness of breath. Negative for hemoptysis.   Cardiovascular:  Positive for chest pain.  Gastrointestinal:  Positive for abdominal pain.    Physical Exam Updated Vital Signs BP (!)  167/63 (BP Location: Right Arm)   Pulse 93   Temp 98.1 F (36.7 C) (Oral)   Resp (!) 28   Ht 5\' 3"  (1.6 m)   Wt 67.1 kg   SpO2 95%   BMI 26.22 kg/m  Physical Exam Vitals and nursing note reviewed.  Constitutional:      General: She is in acute distress.     Appearance: She is well-developed.  HENT:     Head: Normocephalic and atraumatic.  Eyes:     Conjunctiva/sclera: Conjunctivae normal.  Cardiovascular:     Rate and Rhythm: Normal rate and regular rhythm.     Heart sounds: No murmur heard. Pulmonary:     Effort: Tachypnea and accessory muscle usage present. No respiratory distress.     Breath sounds: Rhonchi present.  Abdominal:     Palpations: Abdomen is soft.     Tenderness: There is no abdominal tenderness. There is no guarding or rebound.  Musculoskeletal:        General: No swelling.     Cervical back: Neck supple.     Right lower leg: No tenderness.     Left lower leg: No tenderness.  Skin:    General: Skin is warm and dry.     Capillary Refill: Capillary refill takes less than 2 seconds.  Neurological:     General: No focal deficit present.     Mental Status: She is alert.     ED Results / Procedures / Treatments   Labs (all labs ordered are listed, but only abnormal results are displayed) Labs Reviewed  RESP PANEL BY RT-PCR (RSV, FLU A&B, COVID)  RVPGX2 - Abnormal; Notable for the following components:      Result Value   Influenza A by PCR POSITIVE (*)    All other components within normal limits  COMPREHENSIVE METABOLIC PANEL - Abnormal; Notable for the following components:   Sodium 128 (*)    Chloride 94 (*)    Glucose, Bld 109 (*)    Calcium 8.7 (*)    Albumin 3.1 (*)    AST 48 (*)    GFR, Estimated 55 (*)    All other components within normal limits  CBC WITH DIFFERENTIAL/PLATELET - Abnormal; Notable for the following components:   HCT 35.7 (*)    Monocytes Absolute 1.1 (*)    All other components within normal limits  BRAIN NATRIURETIC  PEPTIDE - Abnormal; Notable for the following components:   B Natriuretic Peptide 1,203.0 (*)    All other components within normal limits  TROPONIN I (HIGH SENSITIVITY) - Abnormal; Notable for the following components:   Troponin I (High Sensitivity) 384 (*)    All other components within normal limits  TROPONIN I (HIGH SENSITIVITY) - Abnormal; Notable for the following components:   Troponin I (High Sensitivity) 333 (*)  All other components within normal limits  CULTURE, BLOOD (ROUTINE X 2)  CULTURE, BLOOD (ROUTINE X 2)  LACTIC ACID, PLASMA    EKG None  Radiology DG Chest Port 1 View Result Date: 06/19/2023 CLINICAL DATA:  Dyspnea for 1 week EXAM: PORTABLE CHEST 1 VIEW COMPARISON:  11/23/2022 chest radiograph. FINDINGS: TAVR in place. Partially visualized surgical hardware from ACDF. Stable cardiomediastinal silhouette with normal heart size. No pneumothorax. No pleural effusion. Generalized mild prominence of the parahilar interstitial markings. No consolidative airspace disease. IMPRESSION: Generalized mild prominence of the parahilar interstitial markings, which could represent mild pulmonary edema or atypical/viral infection. Electronically Signed   By: Delbert Phenix M.D.   On: 06/19/2023 14:26    Procedures .Critical Care  Performed by: Terrilee Files, MD Authorized by: Terrilee Files, MD   Critical care provider statement:    Critical care time (minutes):  45   Critical care time was exclusive of:  Separately billable procedures and treating other patients   Critical care was necessary to treat or prevent imminent or life-threatening deterioration of the following conditions:  Respiratory failure and cardiac failure   Critical care was time spent personally by me on the following activities:  Development of treatment plan with patient or surrogate, discussions with consultants, evaluation of patient's response to treatment, examination of patient, obtaining history from  patient or surrogate, ordering and performing treatments and interventions, ordering and review of laboratory studies, ordering and review of radiographic studies, pulse oximetry, re-evaluation of patient's condition and review of old charts   I assumed direction of critical care for this patient from another provider in my specialty: no       Medications Ordered in ED Medications  magnesium sulfate IVPB 2 g 50 mL (2 g Intravenous New Bag/Given 06/19/23 1450)  ipratropium-albuterol (DUONEB) 0.5-2.5 (3) MG/3ML nebulizer solution 3 mL (3 mLs Nebulization Given 06/19/23 1529)  acetaminophen (TYLENOL) tablet 1,000 mg (1,000 mg Oral Given 06/19/23 1451)  furosemide (LASIX) injection 40 mg (40 mg Intravenous Given 06/19/23 1518)  oseltamivir (TAMIFLU) capsule 75 mg (75 mg Oral Given 06/19/23 1526)    ED Course/ Medical Decision Making/ A&P Clinical Course as of 06/19/23 1554  Sat Jun 19, 2023  1417 Chest x-ray does not show any gross infiltrate.  Awaiting radiology reading. [MB]  1442 EKG did not cross in epic.  Normal sinus rhythm rate of 74 right axis nonspecific ST use.  No significant change from prior EKG. [MB]  1501 BNP and troponin elevated.  Have asked respiratory to evaluate her for possible BiPAP.  Patient updated on plan.  Cardiology consulted. [MB]  1530 Tested positive for influenza.  Tamiflu ordered. [MB]  1553 Discussed with [facility] cards Dr Elease Hashimoto. Felt medicine admission. Diuresis.  [MB]    Clinical Course User Index [MB] Terrilee Files, MD                                 Medical Decision Making Amount and/or Complexity of Data Reviewed Labs: ordered. Radiology: ordered.  Risk OTC drugs. Prescription drug management. Decision regarding hospitalization.   This patient complains of cough shortness of breath body aches ; this involves an extensive number of treatment Options and is a complaint that carries with it a high risk of complications and morbidity. The differential  includes flu, pneumonia, COVID, heart failure, ACS  I ordered, reviewed and interpreted labs, which included CBC unremarkable, chemistries with low  sodium, influenza positive, troponins elevated, BNP elevated, blood culture sent I ordered medication IV Lasix, IV magnesium, Tamiflu and reviewed PMP when indicated. I ordered imaging studies which included chest x-ray and I independently    visualized and interpreted imaging which showed interstitial edema infection versus fluid Additional history obtained from patient's daughter Previous records obtained and reviewed in epic including cardiology notes I consulted Dr. Melburn Popper cardiology and discussed lab and imaging findings and discussed disposition.  Cardiac monitoring reviewed, normal sinus rhythm Social determinants considered, physical inactivity Critical Interventions: Initiation of BiPAP for respiratory distress  After the interventions stated above, I reevaluated the patient and found patient to still be with significant work of breathing Admission and further testing considered, her care is signed out to Dr. Hyacinth Meeker to follow-up with hospitalist.  She will need admission to the hospital for flu and congestive heart failure.  Consideration for transfer to [facility] if cardiology involvement is indicated.         Final Clinical Impression(s) / ED Diagnoses Final diagnoses:  Influenza  Elevated troponin  Acute on chronic congestive heart failure, unspecified heart failure type (HCC)  Aortic valve stenosis, etiology of cardiac valve disease unspecified    Rx / DC Orders ED Discharge Orders     None         Terrilee Files, MD 06/19/23 1701

## 2023-06-19 NOTE — Discharge Instructions (Signed)
 Go directly to the emergency department for further evaluation and management of symptoms.  We have given you a breathing treatment prior to leaving today to help with your breathing until you can be triaged and treated at the emergency department

## 2023-06-19 NOTE — ED Notes (Signed)
 Pt off bipap. RT at bedside. Doing well at this time. Will continue to monitor.

## 2023-06-19 NOTE — H&P (Signed)
 History and Physical    Patient: [name]:[id number] DOB: 02-12-1936 DOA: 06/19/2023 DOS: the patient was seen and examined on 06/19/2023 PCP: Raliegh Ip, DO  Patient coming from: Home  Chief Complaint:  Chief Complaint  Patient presents with   Shortness of Breath   HPI: [name] is a 88 y.o. female with medical history significant of hypertension, moderate aortic stenosis, hyperlipidemia, hypertension.  Patient seen for shortness of breath, cough, low-grade fever over the last week.  Her symptoms have been worsening and she is experiencing significant dyspnea on exertion.  She does have a cough that is nonproductive.  Due to her symptoms, she was brought to the hospital by her daughter.  Patient on arrival was quite dyspneic and was placed on BiPAP due to her tachypnea.  Review of Systems: As mentioned in the history of present illness. All other systems reviewed and are negative. Past Medical History:  Diagnosis Date   Chronic headaches    Fibromyalgia    GERD (gastroesophageal reflux disease)    Hemorrhoids    INTERNAL--  POST BANDING 02/ 2015   History of kidney stones    Hyperlipidemia    Hypertension    Moderate aortic stenosis    NSAID long-term use 05/30/2013   OSA (obstructive sleep apnea) CPAP INTOLERANT    Osteoporosis    S/P TAVR (transcatheter aortic valve replacement) 10/19/2017   20 mm Edwards Sapien 3 transcatheter heart valve placed via percutaneous right transfemoral approach    Severe aortic stenosis    Spinal stenosis, multilevel    Past Surgical History:  Procedure Laterality Date   ANTERIOR CERVICAL DECOMP/DISCECTOMY FUSION  11-11-1999   C5 - C6   APPENDECTOMY  AGE 41   CARDIAC CATHETERIZATION  2008  DR NISHAN   ESSENTIALLY NORMAL   CATARACT EXTRACTION Bilateral    CATARACT EXTRACTION W/ INTRAOCULAR LENS  IMPLANT, BILATERAL  2012   EYE SURGERY     FLEXIBLE SIGMOIDOSCOPY N/A 06/01/2013   Procedure: FLEXIBLE SIGMOIDOSCOPY;  Surgeon:  Louis Meckel, MD;  Location: [facility];  Service: Endoscopy;  Laterality: N/A;  may need hemorrhoidal banding   HEMORRHOIDECTOMY WITH HEMORRHOID BANDING  08-07-2010   IR RADIOLOGY PERIPHERAL GUIDED IV START  06/27/2019   IR US GUIDE VASC ACCESS RIGHT  06/27/2019   LAPAROSCOPIC CHOLECYSTECTOMY  1990   RIGHT/LEFT HEART CATH AND CORONARY ANGIOGRAPHY N/A 09/29/2017   Procedure: RIGHT/LEFT HEART CATH AND CORONARY ANGIOGRAPHY;  Surgeon: Tonny Bollman, MD;  Location: [facility] INVASIVE CV LAB;  Service: Cardiovascular;  Laterality: N/A;   SHOULDER ARTHROSCOPY WITH OPEN ROTATOR CUFF REPAIR AND DISTAL CLAVICLE ACROMINECTOMY Right 05/25/2012   Procedure: SHOULDER ARTHROSCOPY WITH OPEN ROTATOR CUFF REPAIR AND DISTAL CLAVICLE ACROMINECTOMY;  Surgeon: Drucilla Schmidt, MD;  Location: [facility];  Service: Orthopedics;  Laterality: Right;  RIGHT SHOULDER ARTHROSCOPY WITH DERBRIDEMENTOF LABRAL/BICEP TENDON, OPEN DISTAL CLAVICLE RESECTION, ANTERIOR ACROMINECTOMY ROTATOR CUFF REPAIR ANESTHESIA: GENERAL/SCALENE NERVE BLOCK   TEE WITHOUT CARDIOVERSION Bilateral 10/19/2017   Procedure: TRANSESOPHAGEAL ECHOCARDIOGRAM (TEE);  Surgeon: Tonny Bollman, MD;  Location: [facility] OR;  Service: Open Heart Surgery;  Laterality: Bilateral;   TENOSYNOVECTOMY Left 01/04/2014   Procedure: LEFT WRIST EXTENSOR TENOSYNOVECTOMY;  Surgeon: Sharma Covert, MD;  Location: [facility];  Service: Orthopedics;  Laterality: Left;   THYROIDECTOMY  AGE 29   GOITER   TONSILLECTOMY AND ADENOIDECTOMY  AGE 3   TRANSCATHETER AORTIC VALVE REPLACEMENT, TRANSFEMORAL  10/19/2017   TRANSCATHETER AORTIC VALVE REPLACEMENT, TRANSFEMORAL Bilateral 10/19/2017  Procedure: TRANSCATHETER AORTIC VALVE REPLACEMENT, TRANSFEMORAL;  Surgeon: Tonny Bollman, MD;  Location: [facility] OR;  Service: Open Heart Surgery;  Laterality: Bilateral;   TRANSTHORACIC ECHOCARDIOGRAM  last one 04-20-2013  DR [facility]    NORMAL LVSF/ EF 60-65%/ MODERATE  AV  STENOSIS  WITH NO AR /  MILD LAE   UMBILICAL HERNIA REPAIR  JUNE 2006   VAGINAL HYSTERECTOMY  01-31-2001   ANTERIOR & POSTERIOR REPAIR/ TRANSVAGINAL BLADDER SLING   Social History:  reports that she quit smoking about 20 years ago. Her smoking use included cigarettes. She started smoking about 25 years ago. She has a 1.3 pack-year smoking history. She has been exposed to tobacco smoke. She has never used smokeless tobacco. She reports current alcohol use. She reports that she does not use drugs.  Allergies  Allergen Reactions   Prolia [Denosumab] Other (See Comments)    Arthralgia/myalgia/jaw pain/headache   Tape Rash and Other (See Comments)    Dermatitis rash "with extended exposure"    Family History  Problem Relation Age of Onset   Cirrhosis Mother        drinking   Heart disease Sister    Other Brother        duodenal ulcer   Heart failure Maternal Grandmother    Gallbladder disease Maternal Grandmother    Stomach cancer Paternal Grandfather    Cancer Son 43       colon    Prior to Admission medications   Medication Sig Start Date End Date Taking? Authorizing Provider  acetaminophen (TYLENOL) 500 MG tablet Take 1,000 mg by mouth daily as needed for moderate pain or headache.   Yes [provider]  amLODipine (NORVASC) 2.5 MG tablet TAKE 1 TABLET BY MOUTH EVERY DAY 12/18/22  Yes Chilton Si, MD  Ascorbic Acid (VITAMIN C) 1000 MG tablet Take 1,000 mg by mouth daily.   Yes [provider]  aspirin EC 81 MG tablet Take 1 tablet (81 mg total) by mouth daily. 01/12/18  Yes Gottschalk, Ashly M, DO  furosemide (LASIX) 20 MG tablet TAKE 1 TABLET BY MOUTH EVERY DAY 12/18/22  Yes Wendall Stade, MD  hydrALAZINE (APRESOLINE) 25 MG tablet Take 1 tablet (25 mg total) by mouth in the morning and at bedtime. 05/05/22  Yes Chilton Si, MD  Magnesium 500 MG CAPS Take 1 capsule by mouth daily.   Yes [provider]  metoprolol tartrate (LOPRESSOR) 50 MG tablet TAKE 1  TABLET BY MOUTH TWICE A DAY 12/18/22  Yes Wendall Stade, MD  nabumetone (RELAFEN) 500 MG tablet Take 2 tablets (1,000 mg total) by mouth 2 (two) times daily. For muscle and joint pain 12/28/22  Yes Stacks, Broadus John, MD  rosuvastatin (CRESTOR) 10 MG tablet Take 1 tablet (10 mg total) by mouth daily. Pt needs to keep upcoming appt in Jan, 2025 for additional refills 04/22/23  Yes Chilton Si, MD    Physical Exam: Vitals:   06/19/23 1347 06/19/23 1351 06/19/23 1409 06/19/23 1529  BP:  (!) 167/63 (!) 147/51   Pulse:  93 89   Resp:  (!) 28    Temp:  98.1 F (36.7 C)    TempSrc:  Oral    SpO2:  95% 94% 100%  Weight: 67.1 kg     Height: 5\' 3"  (1.6 m)      General: Elderly female. Awake and alert and oriented x3. No acute cardiopulmonary distress.  HEENT: Normocephalic atraumatic.  Right and left ears normal in appearance.  Pupils equal,  round, reactive to light. Extraocular muscles are intact. Sclerae anicteric and noninjected.  Moist mucosal membranes. No mucosal lesions.  Neck: Neck supple without lymphadenopathy. No carotid bruits. No masses palpated.  Cardiovascular: Regular rate with normal S1-S2 sounds. No murmurs, rubs, gallops auscultated. No JVD.  Respiratory: On BiPAP.  Wheezing and rales diffusely noted..  No accessory muscle use. Abdomen: Soft, nontender, nondistended. Active bowel sounds. No masses or hepatosplenomegaly  Skin: No rashes, lesions, or ulcerations.  Dry, warm to touch. 2+ dorsalis pedis and radial pulses. Musculoskeletal: No calf or leg pain. All major joints not erythematous nontender.  No upper or lower joint deformation.  Good ROM.  No contractures  Psychiatric: Intact judgment and insight. Pleasant and cooperative. Neurologic: No focal neurological deficits. Strength is 5/5 and symmetric in upper and lower extremities.  Cranial nerves II through XII are grossly intact.  Data Reviewed: Results for orders placed or performed during the hospital encounter of  06/19/23 (from the past 24 hours)  Comprehensive metabolic panel     Status: Abnormal   Collection Time: 06/19/23  2:20 PM  Result Value Ref Range   Sodium 128 (L) 135 - 145 mmol/L   Potassium 4.4 3.5 - 5.1 mmol/L   Chloride 94 (L) 98 - 111 mmol/L   CO2 23 22 - 32 mmol/L   Glucose, Bld 109 (H) 70 - 99 mg/dL   BUN 21 8 - 23 mg/dL   Creatinine, Ser 4.09 0.44 - 1.00 mg/dL   Calcium 8.7 (L) 8.9 - 10.3 mg/dL   Total Protein 7.0 6.5 - 8.1 g/dL   Albumin 3.1 (L) 3.5 - 5.0 g/dL   AST 48 (H) 15 - 41 U/L   ALT 32 0 - 44 U/L   Alkaline Phosphatase 49 38 - 126 U/L   Total Bilirubin 0.7 0.0 - 1.2 mg/dL   GFR, Estimated 55 (L) >60 mL/min   Anion gap 11 5 - 15  Lactic acid, plasma     Status: None   Collection Time: 06/19/23  2:20 PM  Result Value Ref Range   Lactic Acid, Venous 1.5 0.5 - 1.9 mmol/L  Culture, blood (routine x 2)     Status: None (Preliminary result)   Collection Time: 06/19/23  2:20 PM   Specimen: BLOOD  Result Value Ref Range   Specimen Description BLOOD RIGHT ANTECUBITAL    Special Requests      BOTTLES DRAWN AEROBIC AND ANAEROBIC Blood Culture adequate volume Performed at [facility], [address] [place], [place] [address]    Culture PENDING    Report Status PENDING   Culture, blood (routine x 2)     Status: None (Preliminary result)   Collection Time: 06/19/23  2:20 PM   Specimen: BLOOD  Result Value Ref Range   Specimen Description BLOOD LEFT ANTECUBITAL    Special Requests      BOTTLES DRAWN AEROBIC AND ANAEROBIC Blood Culture adequate volume Performed at [facility], [address] [place], [place] [address]    Culture PENDING    Report Status PENDING   CBC with Differential     Status: Abnormal   Collection Time: 06/19/23  2:20 PM  Result Value Ref Range   WBC 10.2 4.0 - 10.5 K/uL   RBC 3.98 3.87 - 5.11 MIL/uL   Hemoglobin 12.0 12.0 - 15.0 g/dL   HCT 56.2 (L) 13.0 - 86.5 %   MCV 89.7 80.0 - 100.0 fL   MCH 30.2 26.0 - 34.0 pg   MCHC 33.6  30.0 -  36.0 g/dL   RDW 91.4 78.2 - 95.6 %   Platelets 244 150 - 400 K/uL   nRBC 0.0 0.0 - 0.2 %   Neutrophils Relative % 74 %   Neutro Abs 7.5 1.7 - 7.7 K/uL   Lymphocytes Relative 16 %   Lymphs Abs 1.6 0.7 - 4.0 K/uL   Monocytes Relative 10 %   Monocytes Absolute 1.1 (H) 0.1 - 1.0 K/uL   Eosinophils Relative 0 %   Eosinophils Absolute 0.0 0.0 - 0.5 K/uL   Basophils Relative 0 %   Basophils Absolute 0.0 0.0 - 0.1 K/uL   Immature Granulocytes 0 %   Abs Immature Granulocytes 0.04 0.00 - 0.07 K/uL  Brain natriuretic peptide     Status: Abnormal   Collection Time: 06/19/23  2:20 PM  Result Value Ref Range   B Natriuretic Peptide 1,203.0 (H) 0.0 - 100.0 pg/mL  Troponin I (High Sensitivity)     Status: Abnormal   Collection Time: 06/19/23  2:20 PM  Result Value Ref Range   Troponin I (High Sensitivity) 384 (HH) <18 ng/L  Resp panel by RT-PCR (RSV, Flu A&B, Covid) Anterior Nasal Swab     Status: Abnormal   Collection Time: 06/19/23  2:31 PM   Specimen: Anterior Nasal Swab  Result Value Ref Range   SARS Coronavirus 2 by RT PCR NEGATIVE NEGATIVE   Influenza A by PCR POSITIVE (A) NEGATIVE   Influenza B by PCR NEGATIVE NEGATIVE   Resp Syncytial Virus by PCR NEGATIVE NEGATIVE  Troponin I (High Sensitivity)     Status: Abnormal   Collection Time: 06/19/23  3:57 PM  Result Value Ref Range   Troponin I (High Sensitivity) 333 (HH) <18 ng/L    DG Chest Port 1 View Result Date: 06/19/2023 CLINICAL DATA:  Dyspnea for 1 week EXAM: PORTABLE CHEST 1 VIEW COMPARISON:  11/23/2022 chest radiograph. FINDINGS: TAVR in place. Partially visualized surgical hardware from ACDF. Stable cardiomediastinal silhouette with normal heart size. No pneumothorax. No pleural effusion. Generalized mild prominence of the parahilar interstitial markings. No consolidative airspace disease. IMPRESSION: Generalized mild prominence of the parahilar interstitial markings, which could represent mild pulmonary edema or atypical/viral  infection. Electronically Signed   By: Delbert Phenix M.D.   On: 06/19/2023 14:26     Assessment and Plan: No notes have been filed under this hospital service. Service: Hospitalist  Principal Problem:   Acute respiratory failure with hypoxia (HCC) Active Problems:   Essential hypertension   GERD   Mixed hyperlipidemia   S/P TAVR (transcatheter aortic valve replacement)   Influenza A with pneumonia   Hyponatremia   Pulmonary edema   Elevated troponin  Acute respiratory failure with hypoxia On BiPAP Influenza Will start Tamiflu Pulmonary edema Telemetry monitoring Strict I/O Daily Weights Diuresis: Lasix Potassium: 40 mEq twice a day by mouth Echo cardiac exam tomorrow Repeat BMP tomorrow Elevated troponins Repeat troponin tonight and tomorrow Hyponatremia Likely due to fluid overloaded state GERD Hypertension Continue antihypertensives   Advance Care Planning:   Code Status: Full Code confirmed by patient  Consults: none  Family Communication: daughter present  Severity of Illness: The appropriate patient status for this patient is INPATIENT. Inpatient status is judged to be reasonable and necessary in order to provide the required intensity of service to ensure the patient's safety. The patient's presenting symptoms, physical exam findings, and initial radiographic and laboratory data in the context of their chronic comorbidities is felt to place them at high risk  for further clinical deterioration. Furthermore, it is not anticipated that the patient will be medically stable for discharge from the hospital within 2 midnights of admission.   * I certify that at the point of admission it is my clinical judgment that the patient will require inpatient hospital care spanning beyond 2 midnights from the point of admission due to high intensity of service, high risk for further deterioration and high frequency of surveillance required.*  Author: Levie Heritage,  DO 06/19/2023 5:56 PM  For on call review [email or web address].

## 2023-06-19 NOTE — ED Notes (Signed)
 Patient is being discharged from the Urgent Care and sent to the Emergency Department via POV. Per PA, patient is in need of higher level of care due to hypoxia/abnormal lung sounds. Patient is aware and verbalizes understanding of plan of care.  Vitals:   06/19/23 1312  BP: (!) 153/69  Pulse: (!) 101  Resp: (!) 28  Temp: 98.1 F (36.7 C)  SpO2: 91%

## 2023-06-19 NOTE — ED Triage Notes (Signed)
 Pt reports she feels like phlegm is on her lungs, has wheezing, weak, loss of appetite, left side mid abdominal pain x 1 day. Sxs began x 6 days.

## 2023-06-20 ENCOUNTER — Inpatient Hospital Stay (HOSPITAL_COMMUNITY)

## 2023-06-20 DIAGNOSIS — R7989 Other specified abnormal findings of blood chemistry: Secondary | ICD-10-CM

## 2023-06-20 DIAGNOSIS — I10 Essential (primary) hypertension: Secondary | ICD-10-CM | POA: Diagnosis not present

## 2023-06-20 DIAGNOSIS — J9601 Acute respiratory failure with hypoxia: Secondary | ICD-10-CM

## 2023-06-20 DIAGNOSIS — Z952 Presence of prosthetic heart valve: Secondary | ICD-10-CM

## 2023-06-20 DIAGNOSIS — J09X1 Influenza due to identified novel influenza A virus with pneumonia: Secondary | ICD-10-CM

## 2023-06-20 DIAGNOSIS — I5033 Acute on chronic diastolic (congestive) heart failure: Secondary | ICD-10-CM

## 2023-06-20 LAB — CBC
HCT: 35.6 % — ABNORMAL LOW (ref 36.0–46.0)
Hemoglobin: 11.9 g/dL — ABNORMAL LOW (ref 12.0–15.0)
MCH: 29.8 pg (ref 26.0–34.0)
MCHC: 33.4 g/dL (ref 30.0–36.0)
MCV: 89.2 fL (ref 80.0–100.0)
Platelets: 254 10*3/uL (ref 150–400)
RBC: 3.99 MIL/uL (ref 3.87–5.11)
RDW: 12.4 % (ref 11.5–15.5)
WBC: 16.2 10*3/uL — ABNORMAL HIGH (ref 4.0–10.5)
nRBC: 0 % (ref 0.0–0.2)

## 2023-06-20 LAB — URINALYSIS, W/ REFLEX TO CULTURE (INFECTION SUSPECTED)
Bilirubin Urine: NEGATIVE
Glucose, UA: NEGATIVE mg/dL
Ketones, ur: NEGATIVE mg/dL
Leukocytes,Ua: NEGATIVE
Nitrite: NEGATIVE
Protein, ur: NEGATIVE mg/dL
Specific Gravity, Urine: 1.008 (ref 1.005–1.030)
pH: 5 (ref 5.0–8.0)

## 2023-06-20 LAB — ECHOCARDIOGRAM COMPLETE
AR max vel: 1.85 cm2
AV Area VTI: 1.66 cm2
AV Area mean vel: 1.81 cm2
AV Mean grad: 9.6 mmHg
AV Peak grad: 18 mmHg
Ao pk vel: 2.12 m/s
Area-P 1/2: 4.68 cm2
Calc EF: 58.5 %
Est EF: >75
Height: 63 in
MV VTI: 2.36 cm2
S' Lateral: 2.4 cm
Single Plane A2C EF: 43.7 %
Single Plane A4C EF: 68.9 %
Weight: 2368 [oz_av]

## 2023-06-20 LAB — BLOOD GAS, VENOUS
Acid-Base Excess: 2.8 mmol/L — ABNORMAL HIGH (ref 0.0–2.0)
Bicarbonate: 27.2 mmol/L (ref 20.0–28.0)
Drawn by: 27160
O2 Saturation: 73.3 %
Patient temperature: 37.2
pCO2, Ven: 40 mmHg — ABNORMAL LOW (ref 44–60)
pH, Ven: 7.44 — ABNORMAL HIGH (ref 7.25–7.43)
pO2, Ven: 44 mmHg (ref 32–45)

## 2023-06-20 LAB — BASIC METABOLIC PANEL
Anion gap: 7 (ref 5–15)
BUN: 24 mg/dL — ABNORMAL HIGH (ref 8–23)
CO2: 22 mmol/L (ref 22–32)
Calcium: 8.3 mg/dL — ABNORMAL LOW (ref 8.9–10.3)
Chloride: 96 mmol/L — ABNORMAL LOW (ref 98–111)
Creatinine, Ser: 1.19 mg/dL — ABNORMAL HIGH (ref 0.44–1.00)
GFR, Estimated: 45 mL/min — ABNORMAL LOW (ref >60)
Glucose, Bld: 98 mg/dL (ref 70–99)
Potassium: 4.9 mmol/L (ref 3.5–5.1)
Sodium: 125 mmol/L — ABNORMAL LOW (ref 135–145)

## 2023-06-20 LAB — PROCALCITONIN: Procalcitonin: 0.64 ng/mL

## 2023-06-20 LAB — TROPONIN I (HIGH SENSITIVITY): Troponin I (High Sensitivity): 220 ng/L (ref <18)

## 2023-06-20 MED ORDER — BUDESONIDE 0.5 MG/2ML IN SUSP
0.5000 mg | Freq: Two times a day (BID) | RESPIRATORY_TRACT | Status: DC
  Administered 2023-06-20 – 2023-06-24 (×8): 0.5 mg via RESPIRATORY_TRACT
  Filled 2023-06-20 (×9): qty 2

## 2023-06-20 MED ORDER — ENSURE ENLIVE PO LIQD
237.0000 mL | Freq: Two times a day (BID) | ORAL | Status: DC
  Administered 2023-06-20 – 2023-06-24 (×7): 237 mL via ORAL

## 2023-06-20 MED ORDER — HYDROCODONE BIT-HOMATROP MBR 5-1.5 MG/5ML PO SOLN
5.0000 mL | ORAL | Status: DC | PRN
  Administered 2023-06-20 – 2023-06-22 (×8): 5 mL via ORAL
  Filled 2023-06-20 (×8): qty 5

## 2023-06-20 MED ORDER — METOPROLOL TARTRATE 25 MG PO TABS
25.0000 mg | ORAL_TABLET | Freq: Two times a day (BID) | ORAL | Status: DC
  Administered 2023-06-20 – 2023-06-24 (×9): 25 mg via ORAL
  Filled 2023-06-20 (×9): qty 1

## 2023-06-20 MED ORDER — PROCHLORPERAZINE EDISYLATE 10 MG/2ML IJ SOLN
10.0000 mg | Freq: Four times a day (QID) | INTRAMUSCULAR | Status: DC | PRN
  Administered 2023-06-22: 10 mg via INTRAVENOUS
  Filled 2023-06-20: qty 2

## 2023-06-20 NOTE — Progress Notes (Signed)
  Echocardiogram 2D Echocardiogram has been performed.  [name] 06/20/2023, 9:49 AM

## 2023-06-20 NOTE — TOC Initial Note (Signed)
 Transition of Care [facility]) - Initial/Assessment Note    Patient Details  Name: [name] MRN: [id number] Date of Birth: 1935-06-03  [facility] CM/SW Contact:    Leitha Bleak, RN Phone Number: 06/20/2023, 11:25 AM  Clinical Narrative:           Patient admitted with acute respiratory failure, In ED waiting on a bed. Patient has PCP and cardiologist. Consult for CHF.  Self care CHF education added to AVS. Patient continuing work up and on 3L at present.  DC will be 2-3 days. TOC following for needs.     Expected Discharge Plan: Home/Self Care Barriers to Discharge: Continued Medical Work up   Patient Goals and CMS Choice Patient states their goals for this hospitalization and ongoing recovery are:: return home CMS Medicare.gov Compare Post Acute Care list provided to:: Patient Choice offered to / list presented to : Patient      Expected Discharge Plan and Services       Living arrangements for the past 2 months: Single Family Home                                      Prior Living Arrangements/Services Living arrangements for the past 2 months: Single Family Home Lives with:: Spouse                   Activities of Daily Living   ADL Screening (condition at time of admission) Independently performs ADLs?: Yes (appropriate for developmental age) Is the patient deaf or have difficulty hearing?: No Does the patient have difficulty seeing, even when wearing glasses/contacts?: No Does the patient have difficulty concentrating, remembering, or making decisions?: No  Permission Sought/Granted                  Emotional Assessment       Orientation: : Oriented to Self, Oriented to Place, Oriented to Situation Alcohol / Substance Use: Not Applicable Psych Involvement: No (comment)  Admission diagnosis:  Acute respiratory failure with hypoxia (HCC) [J96.01] Patient Active Problem List   Diagnosis Date Noted   Influenza A with pneumonia  06/19/2023   Hyponatremia 06/19/2023   Pulmonary edema 06/19/2023   Elevated troponin 06/19/2023   Acute respiratory failure with hypoxia (HCC) 06/19/2023   Dysphagia 05/26/2021   Elevated lipase 05/26/2021   Bilateral low back pain with sciatica 09/19/2019   Bilateral lower extremity edema 08/14/2019   S/P TAVR (transcatheter aortic valve replacement) 10/19/2017   Chronic fatigue 08/25/2017   DDD (degenerative disc disease), cervical 07/08/2017   Bursitis of left shoulder 07/08/2017   Fibromyalgia 07/08/2017   Atherosclerosis of abdominal aorta (HCC) 06/29/2017   Mixed hyperlipidemia 03/19/2017   Osteoporosis 04/21/2016   GI bleed 05/30/2013   NSAID long-term use 05/30/2013   Severe aortic stenosis 05/29/2013   Diverticulosis of colon 01/13/2010   Constipation 01/13/2010   History of colonic polyps 01/13/2010   Essential hypertension 08/28/2008   GERD 08/28/2008   Palpitations 08/28/2008   PCP:  Raliegh Ip, DO Pharmacy:   [phone] - [place], [place] - [address] [place] [address] Phone: [phone] Fax: [phone]     Social Drivers of Health (SDOH) Social History: SDOH Screenings   Food Insecurity: No Food Insecurity (06/19/2023)  Housing: Low Risk  (06/19/2023)  Transportation Needs: No Transportation Needs (06/19/2023)  Utilities: Not At Risk (06/19/2023)  Alcohol Screen: Low Risk  (08/18/2022)  Depression (PHQ2-9): Low Risk  (05/13/2023)  Financial Resource Strain: Low Risk  (11/22/2022)  Physical Activity: Inactive (11/22/2022)  Social Connections: Moderately Integrated (06/19/2023)  Stress: No Stress Concern Present (11/22/2022)  Tobacco Use: Medium Risk (06/19/2023)   SDOH Interventions:     Readmission Risk Interventions     No data to display

## 2023-06-20 NOTE — ED Provider Notes (Signed)
 [facility]    CSN: [id number] Arrival date & time: 06/19/23  1245      History   Chief Complaint No chief complaint on file.   HPI [name] is a 88 y.o. female.   Patient presenting today with about 6 days of productive cough, wheezing, weakness, loss of appetite, abdominal discomfort that has significantly worsened over the past day.  Significantly shortness of breath, lethargic, pale and wheezing at this time.  So far trying over-the-counter remedies with minimal relief.  No known history of chronic pulmonary disease.  Does have a history of aortic stenosis, hypertension, hyperlipidemia, fibromyalgia.    Past Medical History:  Diagnosis Date   Chronic headaches    Fibromyalgia    GERD (gastroesophageal reflux disease)    Hemorrhoids    INTERNAL--  POST BANDING 02/ 2015   History of kidney stones    Hyperlipidemia    Hypertension    Moderate aortic stenosis    NSAID long-term use 05/30/2013   OSA (obstructive sleep apnea) CPAP INTOLERANT    Osteoporosis    S/P TAVR (transcatheter aortic valve replacement) 10/19/2017   20 mm Edwards Sapien 3 transcatheter heart valve placed via percutaneous right transfemoral approach    Severe aortic stenosis    Spinal stenosis, multilevel     Patient Active Problem List   Diagnosis Date Noted   Influenza A with pneumonia 06/19/2023   Hyponatremia 06/19/2023   Pulmonary edema 06/19/2023   Elevated troponin 06/19/2023   Acute respiratory failure with hypoxia (HCC) 06/19/2023   Dysphagia 05/26/2021   Elevated lipase 05/26/2021   Bilateral low back pain with sciatica 09/19/2019   Bilateral lower extremity edema 08/14/2019   S/P TAVR (transcatheter aortic valve replacement) 10/19/2017   Chronic fatigue 08/25/2017   DDD (degenerative disc disease), cervical 07/08/2017   Bursitis of left shoulder 07/08/2017   Fibromyalgia 07/08/2017   Atherosclerosis of abdominal aorta (HCC) 06/29/2017   Mixed hyperlipidemia  03/19/2017   Osteoporosis 04/21/2016   GI bleed 05/30/2013   NSAID long-term use 05/30/2013   Severe aortic stenosis 05/29/2013   Diverticulosis of colon 01/13/2010   Constipation 01/13/2010   History of colonic polyps 01/13/2010   Essential hypertension 08/28/2008   GERD 08/28/2008   Palpitations 08/28/2008    Past Surgical History:  Procedure Laterality Date   ANTERIOR CERVICAL DECOMP/DISCECTOMY FUSION  11-11-1999   C5 - C6   APPENDECTOMY  AGE 51   CARDIAC CATHETERIZATION  2008  DR NISHAN   ESSENTIALLY NORMAL   CATARACT EXTRACTION Bilateral    CATARACT EXTRACTION W/ INTRAOCULAR LENS  IMPLANT, BILATERAL  2012   EYE SURGERY     FLEXIBLE SIGMOIDOSCOPY N/A 06/01/2013   Procedure: FLEXIBLE SIGMOIDOSCOPY;  Surgeon: Louis Meckel, MD;  Location: [facility];  Service: Endoscopy;  Laterality: N/A;  may need hemorrhoidal banding   HEMORRHOIDECTOMY WITH HEMORRHOID BANDING  08-07-2010   IR RADIOLOGY PERIPHERAL GUIDED IV START  06/27/2019   IR US GUIDE VASC ACCESS RIGHT  06/27/2019   LAPAROSCOPIC CHOLECYSTECTOMY  1990   RIGHT/LEFT HEART CATH AND CORONARY ANGIOGRAPHY N/A 09/29/2017   Procedure: RIGHT/LEFT HEART CATH AND CORONARY ANGIOGRAPHY;  Surgeon: Tonny Bollman, MD;  Location: [facility] INVASIVE CV LAB;  Service: Cardiovascular;  Laterality: N/A;   SHOULDER ARTHROSCOPY WITH OPEN ROTATOR CUFF REPAIR AND DISTAL CLAVICLE ACROMINECTOMY Right 05/25/2012   Procedure: SHOULDER ARTHROSCOPY WITH OPEN ROTATOR CUFF REPAIR AND DISTAL CLAVICLE ACROMINECTOMY;  Surgeon: Drucilla Schmidt, MD;  Location: [facility];  Service: Orthopedics;  Laterality: Right;  RIGHT SHOULDER ARTHROSCOPY WITH DERBRIDEMENTOF LABRAL/BICEP TENDON, OPEN DISTAL CLAVICLE RESECTION, ANTERIOR ACROMINECTOMY ROTATOR CUFF REPAIR ANESTHESIA: GENERAL/SCALENE NERVE BLOCK   TEE WITHOUT CARDIOVERSION Bilateral 10/19/2017   Procedure: TRANSESOPHAGEAL ECHOCARDIOGRAM (TEE);  Surgeon: Tonny Bollman, MD;  Location: [facility] OR;  Service:  Open Heart Surgery;  Laterality: Bilateral;   TENOSYNOVECTOMY Left 01/04/2014   Procedure: LEFT WRIST EXTENSOR TENOSYNOVECTOMY;  Surgeon: Sharma Covert, MD;  Location: [facility];  Service: Orthopedics;  Laterality: Left;   THYROIDECTOMY  AGE 57   GOITER   TONSILLECTOMY AND ADENOIDECTOMY  AGE 89   TRANSCATHETER AORTIC VALVE REPLACEMENT, TRANSFEMORAL  10/19/2017   TRANSCATHETER AORTIC VALVE REPLACEMENT, TRANSFEMORAL Bilateral 10/19/2017   Procedure: TRANSCATHETER AORTIC VALVE REPLACEMENT, TRANSFEMORAL;  Surgeon: Tonny Bollman, MD;  Location: [facility] OR;  Service: Open Heart Surgery;  Laterality: Bilateral;   TRANSTHORACIC ECHOCARDIOGRAM  last one 04-20-2013  DR [facility]    NORMAL LVSF/ EF 60-65%/ MODERATE  AV  STENOSIS WITH NO AR /  MILD LAE   UMBILICAL HERNIA REPAIR  JUNE 2006   VAGINAL HYSTERECTOMY  01-31-2001   ANTERIOR & POSTERIOR REPAIR/ TRANSVAGINAL BLADDER SLING    OB History     Gravida  4   Para  3   Term  3   Preterm      AB  1   Living  3      SAB  1   IAB      Ectopic      Multiple      Live Births               Home Medications    Prior to Admission medications   Medication Sig Start Date End Date Taking? Authorizing Provider  acetaminophen (TYLENOL) 500 MG tablet Take 1,000 mg by mouth daily as needed for moderate pain or headache.    [provider]  amLODipine (NORVASC) 2.5 MG tablet TAKE 1 TABLET BY MOUTH EVERY DAY 12/18/22   Chilton Si, MD  Ascorbic Acid (VITAMIN C) 1000 MG tablet Take 1,000 mg by mouth daily.    [provider]  aspirin EC 81 MG tablet Take 1 tablet (81 mg total) by mouth daily. 01/12/18   Raliegh Ip, DO  furosemide (LASIX) 20 MG tablet TAKE 1 TABLET BY MOUTH EVERY DAY 12/18/22   Wendall Stade, MD  hydrALAZINE (APRESOLINE) 25 MG tablet Take 1 tablet (25 mg total) by mouth in the morning and at bedtime. 05/05/22   Chilton Si, MD  Magnesium 500 MG CAPS Take 1 capsule by mouth  daily.    [provider]  metoprolol tartrate (LOPRESSOR) 50 MG tablet TAKE 1 TABLET BY MOUTH TWICE A DAY 12/18/22   Wendall Stade, MD  nabumetone (RELAFEN) 500 MG tablet Take 2 tablets (1,000 mg total) by mouth 2 (two) times daily. For muscle and joint pain 12/28/22   Mechele Claude, MD  rosuvastatin (CRESTOR) 10 MG tablet Take 1 tablet (10 mg total) by mouth daily. Pt needs to keep upcoming appt in Jan, 2025 for additional refills 04/22/23   Chilton Si, MD    Family History Family History  Problem Relation Age of Onset   Cirrhosis Mother        drinking   Heart disease Sister    Other Brother        duodenal ulcer   Heart failure Maternal Grandmother    Gallbladder disease Maternal Grandmother    Stomach cancer Paternal Grandfather  Cancer Son 25       colon    Social History Social History   Tobacco Use   Smoking status: Former    Current packs/day: 0.00    Average packs/day: 0.3 packs/day for 5.0 years (1.3 ttl pk-yrs)    Types: Cigarettes    Start date: 04/13/1998    Quit date: 04/14/2003    Years since quitting: 20.1    Passive exposure: Past   Smokeless tobacco: Never  Vaping Use   Vaping status: Never Used  Substance Use Topics   Alcohol use: Yes    Comment: once in a while-social   Drug use: Never    Comment: hemp oil      Allergies   Prolia [denosumab] and Tape   Review of Systems Review of Systems Per HPI  Physical Exam Triage Vital Signs ED Triage Vitals  Encounter Vitals Group     BP 06/19/23 1312 (!) 153/69     Systolic BP Percentile --      Diastolic BP Percentile --      Pulse Rate 06/19/23 1312 (!) 101     Resp 06/19/23 1312 (!) 28     Temp 06/19/23 1312 98.1 F (36.7 C)     Temp Source 06/19/23 1312 Oral     SpO2 06/19/23 1312 91 %     Weight --      Height --      Head Circumference --      Peak Flow --      Pain Score 06/19/23 1314 7     Pain Loc --      Pain Education --      Exclude from Growth Chart --    No  data found.  Updated Vital Signs BP (!) 153/69 (BP Location: Right Arm)   Pulse (!) 101   Temp 98.1 F (36.7 C) (Oral)   Resp (!) 28   SpO2 91%   Visual Acuity Right Eye Distance:   Left Eye Distance:   Bilateral Distance:    Right Eye Near:   Left Eye Near:    Bilateral Near:     Physical Exam Vitals and nursing note reviewed.  Constitutional:      General: She is in acute distress.     Appearance: She is ill-appearing.  HENT:     Head: Atraumatic.     Right Ear: Tympanic membrane and external ear normal.     Left Ear: Tympanic membrane and external ear normal.     Nose: Nose normal.     Mouth/Throat:     Mouth: Mucous membranes are moist.  Eyes:     Extraocular Movements: Extraocular movements intact.     Conjunctiva/sclera: Conjunctivae normal.  Cardiovascular:     Rate and Rhythm: Tachycardia present.     Heart sounds: Normal heart sounds.  Pulmonary:     Effort: Respiratory distress present.     Breath sounds: Wheezing and rales present.  Musculoskeletal:     Cervical back: Normal range of motion and neck supple.  Skin:    General: Skin is warm and dry.     Coloration: Skin is pale.  Neurological:     Mental Status: She is alert. Mental status is at baseline.     Motor: Weakness present.  Psychiatric:        Mood and Affect: Mood normal.        Thought Content: Thought content normal.      UC Treatments / Results  Labs (  all labs ordered are listed, but only abnormal results are displayed) Labs Reviewed - No data to display  EKG   Radiology CT CHEST WO CONTRAST Result Date: 06/20/2023 CLINICAL DATA:  Pneumonia, complication suspected, xray done Respiratory illness, nondiagnostic xray EXAM: CT CHEST WITHOUT CONTRAST TECHNIQUE: Multidetector CT imaging of the chest was performed following the standard protocol without IV contrast. RADIATION DOSE REDUCTION: This exam was performed according to the departmental dose-optimization program which includes  automated exposure control, adjustment of the mA and/or kV according to patient size and/or use of iterative reconstruction technique. COMPARISON:  Chest radiograph from one day prior. 02/26/2023 chest CT FINDINGS: Cardiovascular: Normal heart size. No significant pericardial effusion/thickening. TAVR in place. Three-vessel coronary atherosclerosis. Atherosclerotic nonaneurysmal thoracic aorta. Normal caliber pulmonary arteries. Mediastinum/Nodes: No significant thyroid nodules. Unremarkable esophagus. No pathologically enlarged axillary, mediastinal or hilar lymph nodes, noting limited sensitivity for the detection of hilar adenopathy on this noncontrast study. Lungs/Pleura: No pneumothorax. No pleural effusion. Diffuse bronchial wall thickening. Extensive patchy centrilobular nodularity and tree-in-bud opacity throughout both lungs with scattered mild-to-moderate regions of patchy consolidation in the lower lobes bilaterally, compatible with multilobar bronchopneumonia. No discrete lung masses. No bronchiectasis. Upper abdomen: Nonobstructing 2 mm upper left renal stone. Exophytic 1.3 cm posterior upper right renal cyst with layering calcification, stable size, compatible with a Bosniak category 2 renal cyst for which no follow-up imaging is recommended. Musculoskeletal: No aggressive appearing focal osseous lesions. Moderate thoracic spondylosis. IMPRESSION: 1. Multilobar bronchopneumonia, most severe in the lower lobes. 2. Three-vessel coronary atherosclerosis. 3. Nonobstructing left nephrolithiasis. Electronically Signed   By: Delbert Phenix M.D.   On: 06/20/2023 10:21   DG Chest Port 1 View Result Date: 06/19/2023 CLINICAL DATA:  Dyspnea for 1 week EXAM: PORTABLE CHEST 1 VIEW COMPARISON:  11/23/2022 chest radiograph. FINDINGS: TAVR in place. Partially visualized surgical hardware from ACDF. Stable cardiomediastinal silhouette with normal heart size. No pneumothorax. No pleural effusion. Generalized mild  prominence of the parahilar interstitial markings. No consolidative airspace disease. IMPRESSION: Generalized mild prominence of the parahilar interstitial markings, which could represent mild pulmonary edema or atypical/viral infection. Electronically Signed   By: Delbert Phenix M.D.   On: 06/19/2023 14:26    Procedures Procedures (including critical care time)  Medications Ordered in UC Medications  albuterol (PROVENTIL) (2.5 MG/3ML) 0.083% nebulizer solution 2.5 mg (2.5 mg Nebulization Given 06/19/23 1322)    Initial Impression / Assessment and Plan / UC Course  I have reviewed the triage vital signs and the nursing notes.  Pertinent labs & imaging results that were available during my care of the patient were reviewed by me and considered in my medical decision making (see chart for details).     Hypertensive, tachycardic, tachypneic and hypoxic in triage, appears ill and in respiratory distress.  Oxygen saturations ranging from 87% on room air on ambulation to 91% at rest when focused on deep breathing.  Discussed need for higher level of care in the emergency department given unstable vital signs, severe symptoms.  Albuterol nebulizer treatment given in clinic for immediate relief but agreeable to going to the emergency department as soon as this is over.  Declines EMS transport, patient wishes family member to drive her via private vehicle.  Final Clinical Impressions(s) / UC Diagnoses   Final diagnoses:  Hypoxia  Acute cough  SOB (shortness of breath)  Weakness  Tachycardia     Discharge Instructions      Go directly to the emergency department for further evaluation and  management of symptoms.  We have given you a breathing treatment prior to leaving today to help with your breathing until you can be triaged and treated at the emergency department    ED Prescriptions   None    PDMP not reviewed this encounter.   Particia Nearing, [place] 06/20/23 1208

## 2023-06-20 NOTE — Hospital Course (Signed)
 88 year old female with a history of severe AS s/p TAVR 10/2017, hypertension, hyperlipidemia, fibromyalgia presenting with 1 week history of subjective fevers, chills, coughing, shortness of breath, and myalgias and arthralgias.  The patient had been using over-the-counter essential oils and other homeopathic remedies without much relief.  Her coughing and shortness of breath progressed.  She denied any worsening lower extremity edema.  She went to urgent care on the morning of 06/19/2023.  Apparently the patient was noted to be hypoxic and sent to the emergency department for further evaluation and treatment. The patient denied any chest pain or anginal symptoms in the past week. Her cough has been largely nonproductive.  She denies any hemoptysis.  She has had some nausea without emesis.  She denies any abdominal pain, diarrhea, hematochezia, melena, dysuria, hematuria. She has been having diffuse arthralgias and myalgias. She complained of a frontal headache without any visual disturbance or focal extremity weakness. She states that her daughter recently had influenza.  In the ED, the patient was febrile up to 101.6 F.  She had increased work of breathing and was placed on BiPAP.  She was hemodynamically stable.  She was tachycardic 100-110.  WBC 10.0, hemoglobin 12.0, platelets 254.  Sodium 120, potassium 4.4, bicarbonate 23, serum creatinine 1.00. The patient was given furosemide 40 mg IV x 2.  She was started on bronchodilators.  She was started on oseltamavir. She was weaned off of BiPAP and placed on 3 L.

## 2023-06-20 NOTE — ED Notes (Signed)
 Family updated as to patient's status.

## 2023-06-20 NOTE — ED Notes (Signed)
ECHO being done at this time

## 2023-06-20 NOTE — Progress Notes (Signed)
 PROGRESS NOTE  [name] ZOX:[id number] DOB: 31-Mar-1936 DOA: 06/19/2023 PCP: Raliegh Ip, DO  Brief History:  88 year old female with a history of severe AS s/p TAVR 10/2017, hypertension, hyperlipidemia, fibromyalgia presenting with 1 week history of subjective fevers, chills, coughing, shortness of breath, and myalgias and arthralgias.  The patient had been using over-the-counter essential oils and other homeopathic remedies without much relief.  Her coughing and shortness of breath progressed.  She denied any worsening lower extremity edema.  She went to urgent care on the morning of 06/19/2023.  Apparently the patient was noted to be hypoxic and sent to the emergency department for further evaluation and treatment. The patient denied any chest pain or anginal symptoms in the past week. Her cough has been largely nonproductive.  She denies any hemoptysis.  She has had some nausea without emesis.  She denies any abdominal pain, diarrhea, hematochezia, melena, dysuria, hematuria. She has been having diffuse arthralgias and myalgias. She complained of a frontal headache without any visual disturbance or focal extremity weakness. She states that her daughter recently had influenza.  In the ED, the patient was febrile up to 101.6 F.  She had increased work of breathing and was placed on BiPAP.  She was hemodynamically stable.  She was tachycardic 100-110.  WBC 10.0, hemoglobin 12.0, platelets 254.  Sodium 120, potassium 4.4, bicarbonate 23, serum creatinine 1.00. The patient was given furosemide 40 mg IV x 2.  She was started on bronchodilators.  She was started on oseltamavir. She was weaned off of BiPAP and placed on 3 L.    Assessment/Plan: Acute respiratory failure with hypoxia -Secondary to influenza -Certainly, there may have been a degree of fluid overload -Initially on BiPAP -Currently stable on 3 L -Wean oxygen as tolerated -COVID-19 PCR negative -CT  chest  Influenza pneumonitis -Continue oseltamavir -Continue bronchodilators -Lactic acid 1.5  Acute on chronic HFpEF -Certainly, the patient may have had a degree of fluid overload -BNP 1203 -Given furosemide 40 mg IV x 2 -Weaned off of BiPAP after furosemide -Holding further furosemide dosing and monitor clinically -12/24/2021 echo EF 65 to 70%, no WMA, grade 1 DD, normal RVF -Repeat echo  Elevated troponin -Secondary to demand ischemia -12/24/2021 echo EF 65 to 70%, no WMA, grade 1 DD, normal RVF, mild MR -No chest pain or anginal type symptoms -Troponin 384>> 333>> 221>> 220 -Echocardiogram  Essential hypertension -Holding hydralazine temporarily and monitor BP -Continue metoprolol -Holding amlodipine temporarily and monitor BP  Mixed hyperlipidemia -Continue Crestor  Hyponatremia -Hold further dosing furosemide for now and monitor clinically -Patient had received 2 doses of IV furosemide>> defer urine studies  -Check serum osmolarity  Leukemoid reaction -UA -PCT -lactate 1.5 -follow blood culture        Family Communication:  no Family at bedside  Consultants:  none  Code Status:  FULL   DVT Prophylaxis:  [place] Lovenox   Procedures: As Listed in Progress Note Above  Antibiotics: None       Subjective: Pt denies cp, vomiting, diarrhea, abd pain,  she has nausea.  She c/o gen weak.  Denies headache, visual disturbance, focal weakness.  Objective: Vitals:   06/20/23 0545 06/20/23 0615 06/20/23 0630 06/20/23 0747  BP: (!) 120/51 (!) 112/51 (!) 127/57   Pulse: 94 96 94   Resp: (!) 25 17 18    Temp:      TempSrc:      SpO2: 94% 94% 93%  93%  Weight:      Height:        Intake/Output Summary (Last 24 hours) at 06/20/2023 0840 Last data filed at 06/19/2023 2256 Gross per 24 hour  Intake --  Output 1150 ml  Net -1150 ml   Weight change:  Exam:  General:  Pt is alert, follows commands appropriately, not in acute distress HEENT: No  icterus, No thrush, No neck mass, [place]/AT Cardiovascular: RRR, S1/S2, no rubs, no gallops Respiratory: bilateral rales.  Fine upper airway wheeze Abdomen: Soft/+BS, non tender, non distended, no guarding Extremities: No edema, No lymphangitis, No petechiae, No rashes, no synovitis   Data Reviewed: I have personally reviewed following labs and imaging studies Basic Metabolic Panel: Recent Labs  Lab 06/19/23 1420 06/20/23 0515  NA 128* 125*  K 4.4 4.9  CL 94* 96*  CO2 23 22  GLUCOSE 109* 98  BUN 21 24*  CREATININE 1.00 1.19*  CALCIUM 8.7* 8.3*   Liver Function Tests: Recent Labs  Lab 06/19/23 1420  AST 48*  ALT 32  ALKPHOS 49  BILITOT 0.7  PROT 7.0  ALBUMIN 3.1*   No results for input(s): "LIPASE", "AMYLASE" in the last 168 hours. No results for input(s): "AMMONIA" in the last 168 hours. Coagulation Profile: No results for input(s): "INR", "PROTIME" in the last 168 hours. CBC: Recent Labs  Lab 06/19/23 1420 06/20/23 0515  WBC 10.2 16.2*  NEUTROABS 7.5  --   HGB 12.0 11.9*  HCT 35.7* 35.6*  MCV 89.7 89.2  PLT 244 254   Cardiac Enzymes: No results for input(s): "CKTOTAL", "CKMB", "CKMBINDEX", "TROPONINI" in the last 168 hours. BNP: Invalid input(s): "POCBNP" CBG: No results for input(s): "GLUCAP" in the last 168 hours. HbA1C: No results for input(s): "HGBA1C" in the last 72 hours. Urine analysis:    Component Value Date/Time   COLORURINE YELLOW 10/15/2017 0912   APPEARANCEUR Cloudy (A) 09/22/2021 1542   LABSPEC 1.010 10/15/2017 0912   PHURINE 5.0 10/15/2017 0912   GLUCOSEU Negative 09/22/2021 1542   HGBUR NEGATIVE 10/15/2017 0912   BILIRUBINUR Negative 09/22/2021 1542   KETONESUR NEGATIVE 10/15/2017 0912   PROTEINUR 2+ (A) 09/22/2021 1542   PROTEINUR NEGATIVE 10/15/2017 0912   UROBILINOGEN negative 06/03/2015 1650   UROBILINOGEN 1.0 05/29/2013 1845   NITRITE Negative 09/22/2021 1542   NITRITE NEGATIVE 10/15/2017 0912   LEUKOCYTESUR 1+ (A)  09/22/2021 1542   Sepsis Labs: @LABRCNTIP (procalcitonin:4,lacticidven:4) ) Recent Results (from the past 240 hours)  Culture, blood (routine x 2)     Status: None (Preliminary result)   Collection Time: 06/19/23  2:20 PM   Specimen: Blood  Result Value Ref Range Status   Specimen Description BLOOD RIGHT ANTECUBITAL  Final   Special Requests   Final    BOTTLES DRAWN AEROBIC AND ANAEROBIC Blood Culture adequate volume Performed at [facility], [address] [place], [place] [address]    Culture PENDING  Incomplete   Report Status PENDING  Incomplete  Culture, blood (routine x 2)     Status: None (Preliminary result)   Collection Time: 06/19/23  2:20 PM   Specimen: Blood  Result Value Ref Range Status   Specimen Description BLOOD LEFT ANTECUBITAL  Final   Special Requests   Final    BOTTLES DRAWN AEROBIC AND ANAEROBIC Blood Culture adequate volume Performed at [facility], [address] [place], [place] [address]    Culture PENDING  Incomplete   Report Status PENDING  Incomplete  Resp panel by RT-PCR (RSV, Flu A&B,  Covid) Anterior Nasal Swab     Status: Abnormal   Collection Time: 06/19/23  2:31 PM   Specimen: Anterior Nasal Swab  Result Value Ref Range Status   SARS Coronavirus 2 by RT PCR NEGATIVE NEGATIVE Final    Comment: (NOTE) SARS-CoV-2 target nucleic acids are NOT DETECTED.  The SARS-CoV-2 RNA is generally detectable in upper respiratory specimens during the acute phase of infection. The lowest concentration of SARS-CoV-2 viral copies this assay can detect is 138 copies/mL. A negative result does not preclude SARS-Cov-2 infection and should not be used as the sole basis for treatment or other patient management decisions. A negative result may occur with  improper specimen collection/handling, submission of specimen other than nasopharyngeal swab, presence of viral mutation(s) within the areas targeted by this assay, and inadequate number of viral copies(<138  copies/mL). A negative result must be combined with clinical observations, patient history, and epidemiological information. The expected result is Negative.  Fact Sheet for Patients:  BloggerCourse.com  Fact Sheet for Healthcare Providers:  SeriousBroker.it  This test is no t yet approved or cleared by the [place] FDA and  has been authorized for detection and/or diagnosis of SARS-CoV-2 by FDA under an Emergency Use Authorization (EUA). This EUA will remain  in effect (meaning this test can be used) for the duration of the COVID-19 declaration under Section 564(b)(1) of the Act, 21 U.S.C.section 360bbb-3(b)(1), unless the authorization is terminated  or revoked sooner.       Influenza A by PCR POSITIVE (A) NEGATIVE Final   Influenza B by PCR NEGATIVE NEGATIVE Final    Comment: (NOTE) The Xpert Xpress SARS-CoV-2/FLU/RSV plus assay is intended as an aid in the diagnosis of influenza from Nasopharyngeal swab specimens and should not be used as a sole basis for treatment. Nasal washings and aspirates are unacceptable for Xpert Xpress SARS-CoV-2/FLU/RSV testing.  Fact Sheet for Patients: BloggerCourse.com  Fact Sheet for Healthcare Providers: SeriousBroker.it  This test is not yet approved or cleared by the [place] FDA and has been authorized for detection and/or diagnosis of SARS-CoV-2 by FDA under an Emergency Use Authorization (EUA). This EUA will remain in effect (meaning this test can be used) for the duration of the COVID-19 declaration under Section 564(b)(1) of the Act, 21 U.S.C. section 360bbb-3(b)(1), unless the authorization is terminated or revoked.     Resp Syncytial Virus by PCR NEGATIVE NEGATIVE Final    Comment: (NOTE) Fact Sheet for Patients: BloggerCourse.com  Fact Sheet for Healthcare  Providers: SeriousBroker.it  This test is not yet approved or cleared by the [place] FDA and has been authorized for detection and/or diagnosis of SARS-CoV-2 by FDA under an Emergency Use Authorization (EUA). This EUA will remain in effect (meaning this test can be used) for the duration of the COVID-19 declaration under Section 564(b)(1) of the Act, 21 U.S.C. section 360bbb-3(b)(1), unless the authorization is terminated or revoked.  Performed at [facility], [address], [place], [place] [address]      Scheduled Meds:  aspirin EC  81 mg Oral Daily   Chlorhexidine Gluconate Cloth  6 each Topical Q0600   enoxaparin (LOVENOX) injection  40 mg Subcutaneous Q24H   guaiFENesin  600 mg Oral BID   ipratropium-albuterol  3 mL Nebulization Q6H   metoprolol tartrate  25 mg Oral BID   oseltamivir  30 mg Oral BID   rosuvastatin  10 mg Oral Daily   Continuous Infusions:  Procedures/Studies: DG Chest Port 1 View Result Date:  06/19/2023 CLINICAL DATA:  Dyspnea for 1 week EXAM: PORTABLE CHEST 1 VIEW COMPARISON:  11/23/2022 chest radiograph. FINDINGS: TAVR in place. Partially visualized surgical hardware from ACDF. Stable cardiomediastinal silhouette with normal heart size. No pneumothorax. No pleural effusion. Generalized mild prominence of the parahilar interstitial markings. No consolidative airspace disease. IMPRESSION: Generalized mild prominence of the parahilar interstitial markings, which could represent mild pulmonary edema or atypical/viral infection. Electronically Signed   By: Delbert Phenix M.D.   On: 06/19/2023 14:26    Catarina Hartshorn, DO  [facility]  If 7PM-7AM, please contact night-coverage [email or web address] Password [email or web address] 06/20/2023, 8:40 AM   LOS: 1 day

## 2023-06-21 DIAGNOSIS — J9601 Acute respiratory failure with hypoxia: Secondary | ICD-10-CM | POA: Diagnosis not present

## 2023-06-21 DIAGNOSIS — J09X1 Influenza due to identified novel influenza A virus with pneumonia: Secondary | ICD-10-CM | POA: Diagnosis not present

## 2023-06-21 DIAGNOSIS — Z952 Presence of prosthetic heart valve: Secondary | ICD-10-CM | POA: Diagnosis not present

## 2023-06-21 DIAGNOSIS — R7989 Other specified abnormal findings of blood chemistry: Secondary | ICD-10-CM | POA: Diagnosis not present

## 2023-06-21 LAB — BASIC METABOLIC PANEL
Anion gap: 11 (ref 5–15)
BUN: 30 mg/dL — ABNORMAL HIGH (ref 8–23)
CO2: 21 mmol/L — ABNORMAL LOW (ref 22–32)
Calcium: 8.2 mg/dL — ABNORMAL LOW (ref 8.9–10.3)
Chloride: 90 mmol/L — ABNORMAL LOW (ref 98–111)
Creatinine, Ser: 1.27 mg/dL — ABNORMAL HIGH (ref 0.44–1.00)
GFR, Estimated: 41 mL/min — ABNORMAL LOW (ref >60)
Glucose, Bld: 97 mg/dL (ref 70–99)
Potassium: 4.8 mmol/L (ref 3.5–5.1)
Sodium: 122 mmol/L — ABNORMAL LOW (ref 135–145)

## 2023-06-21 LAB — CBC WITH DIFFERENTIAL/PLATELET
Abs Immature Granulocytes: 0 10*3/uL (ref 0.00–0.07)
Basophils Absolute: 0 10*3/uL (ref 0.0–0.1)
Basophils Relative: 0 %
Eosinophils Absolute: 0 10*3/uL (ref 0.0–0.5)
Eosinophils Relative: 0 %
HCT: 31.5 % — ABNORMAL LOW (ref 36.0–46.0)
Hemoglobin: 10.5 g/dL — ABNORMAL LOW (ref 12.0–15.0)
Lymphocytes Relative: 16 %
Lymphs Abs: 2.4 10*3/uL (ref 0.7–4.0)
MCH: 29.9 pg (ref 26.0–34.0)
MCHC: 33.3 g/dL (ref 30.0–36.0)
MCV: 89.7 fL (ref 80.0–100.0)
Monocytes Absolute: 2.1 10*3/uL — ABNORMAL HIGH (ref 0.1–1.0)
Monocytes Relative: 14 %
Neutro Abs: 10.5 10*3/uL — ABNORMAL HIGH (ref 1.7–7.7)
Neutrophils Relative %: 70 %
Platelets: 262 10*3/uL (ref 150–400)
RBC: 3.51 MIL/uL — ABNORMAL LOW (ref 3.87–5.11)
RDW: 12.9 % (ref 11.5–15.5)
WBC: 15 10*3/uL — ABNORMAL HIGH (ref 4.0–10.5)
nRBC: 0 % (ref 0.0–0.2)

## 2023-06-21 LAB — SODIUM: Sodium: 124 mmol/L — ABNORMAL LOW (ref 135–145)

## 2023-06-21 LAB — PROCALCITONIN: Procalcitonin: 0.67 ng/mL

## 2023-06-21 LAB — OSMOLALITY: Osmolality: 271 mosm/kg — ABNORMAL LOW (ref 275–295)

## 2023-06-21 LAB — MAGNESIUM: Magnesium: 2.3 mg/dL (ref 1.7–2.4)

## 2023-06-21 MED ORDER — AZITHROMYCIN 250 MG PO TABS
500.0000 mg | ORAL_TABLET | Freq: Every day | ORAL | Status: DC
  Administered 2023-06-21 – 2023-06-24 (×4): 500 mg via ORAL
  Filled 2023-06-21 (×4): qty 2

## 2023-06-21 MED ORDER — SODIUM CHLORIDE 0.9 % IV SOLN
2.0000 g | INTRAVENOUS | Status: DC
  Administered 2023-06-21 – 2023-06-23 (×3): 2 g via INTRAVENOUS
  Filled 2023-06-21 (×4): qty 20

## 2023-06-21 MED ORDER — ENOXAPARIN SODIUM 30 MG/0.3ML IJ SOSY
30.0000 mg | PREFILLED_SYRINGE | INTRAMUSCULAR | Status: DC
  Administered 2023-06-21 – 2023-06-23 (×3): 30 mg via SUBCUTANEOUS
  Filled 2023-06-21 (×3): qty 0.3

## 2023-06-21 MED ORDER — ARFORMOTEROL TARTRATE 15 MCG/2ML IN NEBU
15.0000 ug | INHALATION_SOLUTION | Freq: Two times a day (BID) | RESPIRATORY_TRACT | Status: DC
  Administered 2023-06-21 – 2023-06-24 (×6): 15 ug via RESPIRATORY_TRACT
  Filled 2023-06-21 (×6): qty 2

## 2023-06-21 MED ORDER — IPRATROPIUM-ALBUTEROL 0.5-2.5 (3) MG/3ML IN SOLN
3.0000 mL | Freq: Four times a day (QID) | RESPIRATORY_TRACT | Status: DC
  Administered 2023-06-21 – 2023-06-23 (×8): 3 mL via RESPIRATORY_TRACT
  Filled 2023-06-21 (×11): qty 3

## 2023-06-21 NOTE — Progress Notes (Signed)
 Bipap order PRN; not needed at this time, no distress noted.

## 2023-06-21 NOTE — Progress Notes (Addendum)
 PROGRESS NOTE  [name] VWU:[id number] DOB: [birth year] DOA: 06/19/2023 PCP: Raliegh Ip, DO  Brief History:  88 year old female with a history of severe AS s/p TAVR 10/2017, hypertension, hyperlipidemia, fibromyalgia presenting with 1 week history of subjective fevers, chills, coughing, shortness of breath, and myalgias and arthralgias.  The patient had been using over-the-counter essential oils and other homeopathic remedies without much relief.  Her coughing and shortness of breath progressed.  She denied any worsening lower extremity edema.  She went to urgent care on the morning of 06/19/2023.  Apparently the patient was noted to be hypoxic and sent to the emergency department for further evaluation and treatment. The patient denied any chest pain or anginal symptoms in the past week. Her cough has been largely nonproductive.  She denies any hemoptysis.  She has had some nausea without emesis.  She denies any abdominal pain, diarrhea, hematochezia, melena, dysuria, hematuria. She has been having diffuse arthralgias and myalgias. She complained of a frontal headache without any visual disturbance or focal extremity weakness. She states that her daughter recently had influenza.  In the ED, the patient was febrile up to 101.6 F.  She had increased work of breathing and was placed on BiPAP.  She was hemodynamically stable.  She was tachycardic 100-110.  WBC 10.0, hemoglobin 12.0, platelets 254.  Sodium 120, potassium 4.4, bicarbonate 23, serum creatinine 1.00.  BNP was 1203 so, the patient was given furosemide 40 mg IV x 2.  She was started on bronchodilators.  She was started on oseltamavir. She was weaned off of BiPAP and placed on 3 L.    Assessment/Plan: Acute respiratory failure with hypoxia -Secondary to influenza pneumonia -Certainly, there may have been a degree of fluid overload -Initially on BiPAP -Currently stable on 3 L -VBG--7.44/40/44/27 -Wean oxygen as  tolerated -COVID-19 PCR negative -CT chest---diffuse bronchial wall thickening;  patchy tree-in-bud opacities bilateral; patchy consolidation bilateral LL -PCT 0.64>>repeat -start empiric azithro/ceftriaxone   Influenza pneumonitis -Continue oseltamavir -Continue bronchodilators -Lactic acid 1.5   Acute on chronic HFpEF?? -Certainly, the patient may have had a degree of fluid overload -BNP 1203 -Given furosemide 40 mg IV x 2 -Weaned off of BiPAP after furosemide -Holding further furosemide dosing---now clinically euvolemic -12/24/2021 echo EF 65 to 70%, no WMA, grade 1 DD, normal RVF -3/9 echo--EF >75%, no WMA, indeterminant diastology. normal RVF, moderate MS   Elevated troponin -Secondary to demand ischemia -12/24/2021 echo EF 65 to 70%, no WMA, grade 1 DD, normal RVF, mild MR -No chest pain or anginal type symptoms -Troponin 384>> 333>> 221>> 220 -3/9 echo--EF >75%, no WMA, indeterminant diastology. normal RVF, moderate MS   Essential hypertension -Holding hydralazine temporarily and monitor BP -Continue metoprolol -Holding amlodipine temporarily and monitor BP   Mixed hyperlipidemia -Continue Crestor   Hyponatremia -Hold further dosing furosemide for now and monitor clinically -Patient had received 2 doses of IV furosemide>> defer urine studies  -Check serum osmolarity -appreciate nephrology   Leukemoid reaction -UA--bland -PCT--0.64 -lactate 1.5 -follow blood culture--neg to date   CKD 3a -baseline creatinine 0.9-1.1           Family Communication:  no Family at bedside   Consultants:  cardiology, nephrology   Code Status:  FULL    DVT Prophylaxis:  [place] Lovenox     Procedures: As Listed in Progress Note Above   Antibiotics: Ceftriaxone 3/10>> Azithro 3/10>>  Subjective: Pt complains of cough, nausea, myalgia, arthralgia.  Denies emesis, diarrhea, hematochezia, melena  Objective: Vitals:   06/21/23 0600 06/21/23 0755 06/21/23  1338 06/21/23 1409  BP: (!) 136/52   (!) 112/46  Pulse: 81   77  Resp:    18  Temp: 99 F (37.2 C)   98.9 F (37.2 C)  TempSrc: Oral   Oral  SpO2: 96% 96% 94% 96%  Weight: 64.6 kg     Height:        Intake/Output Summary (Last 24 hours) at 06/21/2023 1555 Last data filed at 06/21/2023 0900 Gross per 24 hour  Intake 480 ml  Output --  Net 480 ml   Weight change: -2.532 kg Exam:  General:  Pt is alert, follows commands appropriately, not in acute distress HEENT: No icterus, No thrush, No neck mass, /AT Cardiovascular: RRR, S1/S2, no rubs, no gallops Respiratory: bilateral rhonchi.  No wheeze Abdomen: Soft/+BS, non tender, non distended, no guarding Extremities: No edema, No lymphangitis, No petechiae, No rashes, no synovitis   Data Reviewed: I have personally reviewed following labs and imaging studies Basic Metabolic Panel: Recent Labs  Lab 06/19/23 1420 06/20/23 0515 06/21/23 0321  NA 128* 125* 122*  K 4.4 4.9 4.8  CL 94* 96* 90*  CO2 23 22 21*  GLUCOSE 109* 98 97  BUN 21 24* 30*  CREATININE 1.00 1.19* 1.27*  CALCIUM 8.7* 8.3* 8.2*  MG  --   --  2.3   Liver Function Tests: Recent Labs  Lab 06/19/23 1420  AST 48*  ALT 32  ALKPHOS 49  BILITOT 0.7  PROT 7.0  ALBUMIN 3.1*   No results for input(s): "LIPASE", "AMYLASE" in the last 168 hours. No results for input(s): "AMMONIA" in the last 168 hours. Coagulation Profile: No results for input(s): "INR", "PROTIME" in the last 168 hours. CBC: Recent Labs  Lab 06/19/23 1420 06/20/23 0515 06/21/23 0321  WBC 10.2 16.2* 15.0*  NEUTROABS 7.5  --  10.5*  HGB 12.0 11.9* 10.5*  HCT 35.7* 35.6* 31.5*  MCV 89.7 89.2 89.7  PLT 244 254 262   Cardiac Enzymes: No results for input(s): "CKTOTAL", "CKMB", "CKMBINDEX", "TROPONINI" in the last 168 hours. BNP: Invalid input(s): "POCBNP" CBG: No results for input(s): "GLUCAP" in the last 168 hours. HbA1C: No results for input(s): "HGBA1C" in the last 72  hours. Urine analysis:    Component Value Date/Time   COLORURINE YELLOW 06/20/2023 0856   APPEARANCEUR CLEAR 06/20/2023 0856   APPEARANCEUR Cloudy (A) 09/22/2021 1542   LABSPEC 1.008 06/20/2023 0856   PHURINE 5.0 06/20/2023 0856   GLUCOSEU NEGATIVE 06/20/2023 0856   HGBUR MODERATE (A) 06/20/2023 0856   BILIRUBINUR NEGATIVE 06/20/2023 0856   BILIRUBINUR Negative 09/22/2021 1542   KETONESUR NEGATIVE 06/20/2023 0856   PROTEINUR NEGATIVE 06/20/2023 0856   UROBILINOGEN negative 06/03/2015 1650   UROBILINOGEN 1.0 05/29/2013 1845   NITRITE NEGATIVE 06/20/2023 0856   LEUKOCYTESUR NEGATIVE 06/20/2023 0856   Sepsis Labs: @LABRCNTIP (procalcitonin:4,lacticidven:4) ) Recent Results (from the past 240 hours)  Culture, blood (routine x 2)     Status: None (Preliminary result)   Collection Time: 06/19/23  2:20 PM   Specimen: Blood  Result Value Ref Range Status   Specimen Description BLOOD RIGHT ANTECUBITAL  Final   Special Requests   Final    BOTTLES DRAWN AEROBIC AND ANAEROBIC Blood Culture adequate volume Performed at [facility], [address], [place], [place] [address]    Culture PENDING  Incomplete   Report Status  PENDING  Incomplete  Culture, blood (routine x 2)     Status: None (Preliminary result)   Collection Time: 06/19/23  2:20 PM   Specimen: Blood  Result Value Ref Range Status   Specimen Description BLOOD LEFT ANTECUBITAL  Final   Special Requests   Final    BOTTLES DRAWN AEROBIC AND ANAEROBIC Blood Culture adequate volume Performed at [facility], [address] [place], [place] [address]    Culture PENDING  Incomplete   Report Status PENDING  Incomplete  Resp panel by RT-PCR (RSV, Flu A&B, Covid) Anterior Nasal Swab     Status: Abnormal   Collection Time: 06/19/23  2:31 PM   Specimen: Anterior Nasal Swab  Result Value Ref Range Status   SARS Coronavirus 2 by RT PCR NEGATIVE NEGATIVE Final    Comment: (NOTE) SARS-CoV-2 target nucleic acids are NOT  DETECTED.  The SARS-CoV-2 RNA is generally detectable in upper respiratory specimens during the acute phase of infection. The lowest concentration of SARS-CoV-2 viral copies this assay can detect is 138 copies/mL. A negative result does not preclude SARS-Cov-2 infection and should not be used as the sole basis for treatment or other patient management decisions. A negative result may occur with  improper specimen collection/handling, submission of specimen other than nasopharyngeal swab, presence of viral mutation(s) within the areas targeted by this assay, and inadequate number of viral copies(<138 copies/mL). A negative result must be combined with clinical observations, patient history, and epidemiological information. The expected result is Negative.  Fact Sheet for Patients:  BloggerCourse.com  Fact Sheet for Healthcare Providers:  SeriousBroker.it  This test is no t yet approved or cleared by the [place] FDA and  has been authorized for detection and/or diagnosis of SARS-CoV-2 by FDA under an Emergency Use Authorization (EUA). This EUA will remain  in effect (meaning this test can be used) for the duration of the COVID-19 declaration under Section 564(b)(1) of the Act, 21 U.S.C.section 360bbb-3(b)(1), unless the authorization is terminated  or revoked sooner.       Influenza A by PCR POSITIVE (A) NEGATIVE Final   Influenza B by PCR NEGATIVE NEGATIVE Final    Comment: (NOTE) The Xpert Xpress SARS-CoV-2/FLU/RSV plus assay is intended as an aid in the diagnosis of influenza from Nasopharyngeal swab specimens and should not be used as a sole basis for treatment. Nasal washings and aspirates are unacceptable for Xpert Xpress SARS-CoV-2/FLU/RSV testing.  Fact Sheet for Patients: BloggerCourse.com  Fact Sheet for Healthcare Providers: SeriousBroker.it  This test is not  yet approved or cleared by the [place] FDA and has been authorized for detection and/or diagnosis of SARS-CoV-2 by FDA under an Emergency Use Authorization (EUA). This EUA will remain in effect (meaning this test can be used) for the duration of the COVID-19 declaration under Section 564(b)(1) of the Act, 21 U.S.C. section 360bbb-3(b)(1), unless the authorization is terminated or revoked.     Resp Syncytial Virus by PCR NEGATIVE NEGATIVE Final    Comment: (NOTE) Fact Sheet for Patients: BloggerCourse.com  Fact Sheet for Healthcare Providers: SeriousBroker.it  This test is not yet approved or cleared by the [place] FDA and has been authorized for detection and/or diagnosis of SARS-CoV-2 by FDA under an Emergency Use Authorization (EUA). This EUA will remain in effect (meaning this test can be used) for the duration of the COVID-19 declaration under Section 564(b)(1) of the Act, 21 U.S.C. section 360bbb-3(b)(1), unless the authorization is terminated or revoked.  Performed at [facility]  [facility], [address] [place], [place] [address]      Scheduled Meds:  aspirin EC  81 mg Oral Daily   azithromycin  500 mg Oral Daily   budesonide (PULMICORT) nebulizer solution  0.5 mg Nebulization BID   Chlorhexidine Gluconate Cloth  6 each Topical Q0600   enoxaparin (LOVENOX) injection  30 mg Subcutaneous Q24H   feeding supplement  237 mL Oral BID BM   guaiFENesin  600 mg Oral BID   ipratropium-albuterol  3 mL Nebulization Q6H WA   metoprolol tartrate  25 mg Oral BID   oseltamivir  30 mg Oral BID   rosuvastatin  10 mg Oral Daily   Continuous Infusions:  cefTRIAXone (ROCEPHIN)  IV      Procedures/Studies: ECHOCARDIOGRAM COMPLETE Result Date: 06/20/2023    ECHOCARDIOGRAM REPORT   Patient Name:   LEVONNE CARRERAS Date of Exam: 06/20/2023 Medical Rec #:  [id number]   Height:       63.0 in Accession #:    [id number]  Weight:       148.0 lb  Date of Birth:  [birth year]   BSA:          1.701 m Patient Age:    87 years    BP:           127/57 mmHg Patient Gender: F           HR:           103 bpm. [facility] Procedure: 2D Echo, Cardiac Doppler and Color Doppler (Both Spectral and Color            Flow Doppler were utilized during procedure). Indications:    I50.40* Unspecified combined systolic (congestive) and diastolic                 (congestive) heart failure  History:        Patient has prior history of Echocardiogram examinations, most                 recent 12/24/2021. Abnormal ECG, Aortic Valve Disease;                 Signs/Symptoms:Shortness of Breath and Dyspnea. FLU positive.                 Aortic stenosis. TAVR. Pulmonary edema.                 Aortic Valve: 20 mm Edwards Sapien prosthetic, stented (TAVR)                 valve is present in the aortic position. Procedure Date:                 10/19/2017.  Sonographer:    Sheralyn Boatman RDCS Referring Phys: [phone] JACOB J STINSON  Sonographer Comments: Technically difficult study due to poor echo windows, suboptimal parasternal window and suboptimal apical window. IMPRESSIONS  1. Left ventricular ejection fraction, by estimation, is >75%. The left ventricle has hyperdynamic function. The left ventricle has no regional wall motion abnormalities. Left ventricular diastolic parameters are indeterminate.  2. Right ventricular systolic function is normal. The right ventricular size is normal. Tricuspid regurgitation signal is inadequate for assessing PA pressure.  3. The mitral valve is abnormal. No evidence of mitral valve regurgitation. Moderate mitral stenosis. The mean mitral valve gradient is 6.0 mmHg. Moderate mitral annular calcification.  4. The aortic valve was not well visualized. Aortic valve regurgitation is not visualized. No aortic stenosis  is present. There is a 20 mm Edwards Sapien prosthetic (TAVR) valve present in the aortic position. Procedure Date: 10/19/2017. Echo findings  are  consistent with perivalvular leak of the aortic prosthesis.  5. The inferior vena cava is normal in size with greater than 50% respiratory variability, suggesting right atrial pressure of 3 mmHg. FINDINGS  Left Ventricle: Left ventricular ejection fraction, by estimation, is >75%. The left ventricle has hyperdynamic function. The left ventricle has no regional wall motion abnormalities. The left ventricular internal cavity size was normal in size. There is no left ventricular hypertrophy. Left ventricular diastolic parameters are indeterminate. Right Ventricle: The right ventricular size is normal. Right vetricular wall thickness was not well visualized. Right ventricular systolic function is normal. Tricuspid regurgitation signal is inadequate for assessing PA pressure. Left Atrium: Left atrial size was normal in size. Right Atrium: Right atrial size was normal in size. Pericardium: There is no evidence of pericardial effusion. Mitral Valve: The mitral valve is abnormal. There is moderate thickening of the mitral valve leaflet(s). There is moderate calcification of the mitral valve leaflet(s). Moderate mitral annular calcification. No evidence of mitral valve regurgitation. Moderate mitral valve stenosis. MV peak gradient, 14.1 mmHg. The mean mitral valve gradient is 6.0 mmHg. Tricuspid Valve: The tricuspid valve is normal in structure. Tricuspid valve regurgitation is not demonstrated. No evidence of tricuspid stenosis. Aortic Valve: The aortic valve was not well visualized. Aortic valve regurgitation is not visualized. No aortic stenosis is present. Aortic valve mean gradient measures 9.6 mmHg. Aortic valve peak gradient measures 18.0 mmHg. Aortic valve area, by VTI measures 1.66 cm. There is a 20 mm Edwards Sapien prosthetic, stented (TAVR) valve present in the aortic position. Procedure Date: 10/19/2017. Pulmonic Valve: The pulmonic valve was not well visualized. Pulmonic valve regurgitation is not  visualized. No evidence of pulmonic stenosis. Aorta: The aortic root is normal in size and structure and the ascending aorta was not well visualized. Venous: The inferior vena cava is normal in size with greater than 50% respiratory variability, suggesting right atrial pressure of 3 mmHg. IAS/Shunts: The interatrial septum was not well visualized.  LEFT VENTRICLE PLAX 2D LVIDd:         4.60 cm     Diastology LVIDs:         2.40 cm     LV e' medial:    8.05 cm/s LV PW:         1.00 cm     LV E/e' medial:  8.2 LV IVS:        0.90 cm     LV e' lateral:   4.03 cm/s LVOT diam:     2.00 cm     LV E/e' lateral: 16.3 LV SV:         55 LV SV Index:   32 LVOT Area:     3.14 cm  LV Volumes (MOD) LV vol d, MOD A2C: 35.0 ml LV vol d, MOD A4C: 56.9 ml LV vol s, MOD A2C: 19.7 ml LV vol s, MOD A4C: 17.7 ml LV SV MOD A2C:     15.3 ml LV SV MOD A4C:     56.9 ml LV SV MOD BP:      27.2 ml RIGHT VENTRICLE             IVC RV S prime:     12.80 cm/s  IVC diam: 1.40 cm TAPSE (M-mode): 1.1 cm LEFT ATRIUM  Index        RIGHT ATRIUM          Index LA diam:        3.70 cm 2.17 cm/m   RA Area:     8.11 cm LA Vol (A2C):   23.2 ml 13.64 ml/m  RA Volume:   11.80 ml 6.94 ml/m LA Vol (A4C):   22.4 ml 13.17 ml/m LA Biplane Vol: 22.9 ml 13.46 ml/m  AORTIC VALVE AV Area (Vmax):    1.85 cm AV Area (Vmean):   1.81 cm AV Area (VTI):     1.66 cm AV Vmax:           211.92 cm/s AV Vmean:          139.841 cm/s AV VTI:            0.332 m AV Peak Grad:      18.0 mmHg AV Mean Grad:      9.6 mmHg LVOT Vmax:         125.00 cm/s LVOT Vmean:        80.625 cm/s LVOT VTI:          0.176 m LVOT/AV VTI ratio: 0.53  AORTA Ao Root diam: 2.60 cm MITRAL VALVE MV Area (PHT): 4.68 cm     SHUNTS MV Area VTI:   2.36 cm     Systemic VTI:  0.18 m MV Peak grad:  14.1 mmHg    Systemic Diam: 2.00 cm MV Mean grad:  6.0 mmHg MV Vmax:       1.88 m/s MV Vmean:      106.0 cm/s MV Decel Time: 162 msec MV E velocity: 65.80 cm/s MV A velocity: 147.00 cm/s MV E/A  ratio:  0.45 Dina Rich MD Electronically signed by Dina Rich MD Signature Date/Time: 06/20/2023/5:46:01 PM    Final    CT CHEST WO CONTRAST Result Date: 06/20/2023 CLINICAL DATA:  Pneumonia, complication suspected, xray done Respiratory illness, nondiagnostic xray EXAM: CT CHEST WITHOUT CONTRAST TECHNIQUE: Multidetector CT imaging of the chest was performed following the standard protocol without IV contrast. RADIATION DOSE REDUCTION: This exam was performed according to the departmental dose-optimization program which includes automated exposure control, adjustment of the mA and/or kV according to patient size and/or use of iterative reconstruction technique. COMPARISON:  Chest radiograph from one day prior. 02/26/2023 chest CT FINDINGS: Cardiovascular: Normal heart size. No significant pericardial effusion/thickening. TAVR in place. Three-vessel coronary atherosclerosis. Atherosclerotic nonaneurysmal thoracic aorta. Normal caliber pulmonary arteries. Mediastinum/Nodes: No significant thyroid nodules. Unremarkable esophagus. No pathologically enlarged axillary, mediastinal or hilar lymph nodes, noting limited sensitivity for the detection of hilar adenopathy on this noncontrast study. Lungs/Pleura: No pneumothorax. No pleural effusion. Diffuse bronchial wall thickening. Extensive patchy centrilobular nodularity and tree-in-bud opacity throughout both lungs with scattered mild-to-moderate regions of patchy consolidation in the lower lobes bilaterally, compatible with multilobar bronchopneumonia. No discrete lung masses. No bronchiectasis. Upper abdomen: Nonobstructing 2 mm upper left renal stone. Exophytic 1.3 cm posterior upper right renal cyst with layering calcification, stable size, compatible with a Bosniak category 2 renal cyst for which no follow-up imaging is recommended. Musculoskeletal: No aggressive appearing focal osseous lesions. Moderate thoracic spondylosis. IMPRESSION: 1. Multilobar  bronchopneumonia, most severe in the lower lobes. 2. Three-vessel coronary atherosclerosis. 3. Nonobstructing left nephrolithiasis. Electronically Signed   By: Delbert Phenix M.D.   On: 06/20/2023 10:21   DG Chest Port 1 View Result Date: 06/19/2023 CLINICAL DATA:  Dyspnea for 1 week EXAM: PORTABLE CHEST 1  VIEW COMPARISON:  11/23/2022 chest radiograph. FINDINGS: TAVR in place. Partially visualized surgical hardware from ACDF. Stable cardiomediastinal silhouette with normal heart size. No pneumothorax. No pleural effusion. Generalized mild prominence of the parahilar interstitial markings. No consolidative airspace disease. IMPRESSION: Generalized mild prominence of the parahilar interstitial markings, which could represent mild pulmonary edema or atypical/viral infection. Electronically Signed   By: Delbert Phenix M.D.   On: 06/19/2023 14:26    Catarina Hartshorn, DO  [facility]  If 7PM-7AM, please contact night-coverage [email or web address] Password [email or web address] 06/21/2023, 3:55 PM   LOS: 2 days

## 2023-06-21 NOTE — Consult Note (Signed)
 [name] Admit Date: 06/19/2023 06/21/2023 [name] Requesting Physician:  Tat MD  Reason for Consult: Hyponatremia HPI:  88F presented to the ED 06/19/2023 with AHRF, fevers, headache, arthralgias/myalgias and found to have influenza.  Admitted to [facility], placed on oseltamavir.  PMH Incudes: Severe AS status post TAVR 10/2017 Hypertension Hyperlipidemia GERD Remote cholecystectomy, hysterectomy  Patient appears to have fairly preserved GFR.  Admitted with serum sodium of 128 and in September 2024 had a normal value.  Serum sodium has worsened to 122 today.  See below.  Has received intermittent dosing of furosemide to treat any potential pulmonary edema.  She has been drinking her fluids thinking it would be helpful, pushing water.  Appetite is poor.  Urine studies not yet provided because of furosemide dosing.  No edema.  She is lying flat in bed on 3 L nasal cannula and breathing comfortably.  UOP does not appear to be fully quantified.  She currently has a mild headache but no nausea/vomiting.  She is oriented to self, location, year, month, day of the week.  Sodium (mmol/L)  Date Value  06/21/2023 122 (L)  06/20/2023 125 (L)  06/19/2023 128 (L)  12/28/2022 143  05/18/2022 140  01/07/2022 136  12/16/2021 141  05/08/2021 139  02/12/2020 140  09/07/2019 139  06/26/2019 140  05/10/2019 139  05/08/2019 143  12/27/2018 143  ]  I/Os: I/O last 3 completed shifts: In: 240 [P.O.:240] Out: 1150 [Urine:1150]   ROS Balance of 12 systems is negative w/ exceptions as above  PMH  Past Medical History:  Diagnosis Date   Chronic headaches    Fibromyalgia    GERD (gastroesophageal reflux disease)    Hemorrhoids    INTERNAL--  POST BANDING 02/ 2015   History of kidney stones    Hyperlipidemia    Hypertension    Moderate aortic stenosis    NSAID long-term use 05/30/2013   OSA (obstructive sleep apnea) CPAP INTOLERANT    Osteoporosis    S/P TAVR  (transcatheter aortic valve replacement) 10/19/2017   20 mm Edwards Sapien 3 transcatheter heart valve placed via percutaneous right transfemoral approach    Severe aortic stenosis    Spinal stenosis, multilevel    PSH  Past Surgical History:  Procedure Laterality Date   ANTERIOR CERVICAL DECOMP/DISCECTOMY FUSION  11-11-1999   C5 - C6   APPENDECTOMY  AGE 67   CARDIAC CATHETERIZATION  2008  DR NISHAN   ESSENTIALLY NORMAL   CATARACT EXTRACTION Bilateral    CATARACT EXTRACTION W/ INTRAOCULAR LENS  IMPLANT, BILATERAL  2012   EYE SURGERY     FLEXIBLE SIGMOIDOSCOPY N/A 06/01/2013   Procedure: FLEXIBLE SIGMOIDOSCOPY;  Surgeon: Louis Meckel, MD;  Location: [facility];  Service: Endoscopy;  Laterality: N/A;  may need hemorrhoidal banding   HEMORRHOIDECTOMY WITH HEMORRHOID BANDING  08-07-2010   IR RADIOLOGY PERIPHERAL GUIDED IV START  06/27/2019   IR US GUIDE VASC ACCESS RIGHT  06/27/2019   LAPAROSCOPIC CHOLECYSTECTOMY  1990   RIGHT/LEFT HEART CATH AND CORONARY ANGIOGRAPHY N/A 09/29/2017   Procedure: RIGHT/LEFT HEART CATH AND CORONARY ANGIOGRAPHY;  Surgeon: Tonny Bollman, MD;  Location: [facility] INVASIVE CV LAB;  Service: Cardiovascular;  Laterality: N/A;   SHOULDER ARTHROSCOPY WITH OPEN ROTATOR CUFF REPAIR AND DISTAL CLAVICLE ACROMINECTOMY Right 05/25/2012   Procedure: SHOULDER ARTHROSCOPY WITH OPEN ROTATOR CUFF REPAIR AND DISTAL CLAVICLE ACROMINECTOMY;  Surgeon: Drucilla Schmidt, MD;  Location:  [facility];  Service: Orthopedics;  Laterality: Right;  RIGHT  SHOULDER ARTHROSCOPY WITH DERBRIDEMENTOF LABRAL/BICEP TENDON, OPEN DISTAL CLAVICLE RESECTION, ANTERIOR ACROMINECTOMY ROTATOR CUFF REPAIR ANESTHESIA: GENERAL/SCALENE NERVE BLOCK   TEE WITHOUT CARDIOVERSION Bilateral 10/19/2017   Procedure: TRANSESOPHAGEAL ECHOCARDIOGRAM (TEE);  Surgeon: Tonny Bollman, MD;  Location: [facility] OR;  Service: Open Heart Surgery;  Laterality: Bilateral;   TENOSYNOVECTOMY Left 01/04/2014   Procedure: LEFT WRIST  EXTENSOR TENOSYNOVECTOMY;  Surgeon: Sharma Covert, MD;  Location: [facility];  Service: Orthopedics;  Laterality: Left;   THYROIDECTOMY  AGE 44   GOITER   TONSILLECTOMY AND ADENOIDECTOMY  AGE 34   TRANSCATHETER AORTIC VALVE REPLACEMENT, TRANSFEMORAL  10/19/2017   TRANSCATHETER AORTIC VALVE REPLACEMENT, TRANSFEMORAL Bilateral 10/19/2017   Procedure: TRANSCATHETER AORTIC VALVE REPLACEMENT, TRANSFEMORAL;  Surgeon: Tonny Bollman, MD;  Location: [facility] OR;  Service: Open Heart Surgery;  Laterality: Bilateral;   TRANSTHORACIC ECHOCARDIOGRAM  last one 04-20-2013  DR [facility]    NORMAL LVSF/ EF 60-65%/ MODERATE  AV  STENOSIS WITH NO AR /  MILD LAE   UMBILICAL HERNIA REPAIR  JUNE 2006   VAGINAL HYSTERECTOMY  01-31-2001   ANTERIOR & POSTERIOR REPAIR/ TRANSVAGINAL BLADDER SLING   FH  Family History  Problem Relation Age of Onset   Cirrhosis Mother        drinking   Heart disease Sister    Other Brother        duodenal ulcer   Heart failure Maternal Grandmother    Gallbladder disease Maternal Grandmother    Stomach cancer Paternal Grandfather    Cancer Son 63       colon   SH  reports that she quit smoking about 20 years ago. Her smoking use included cigarettes. She started smoking about 25 years ago. She has a 1.3 pack-year smoking history. She has been exposed to tobacco smoke. She has never used smokeless tobacco. She reports current alcohol use. She reports that she does not use drugs. Allergies  Allergies  Allergen Reactions   Prolia [Denosumab] Other (See Comments)    Arthralgia/myalgia/jaw pain/headache   Tape Rash and Other (See Comments)    Dermatitis rash "with extended exposure"   Home medications Prior to Admission medications   Medication Sig Start Date End Date Taking? Authorizing Provider  acetaminophen (TYLENOL) 500 MG tablet Take 1,000 mg by mouth daily as needed for moderate pain or headache.   Yes [provider]  amLODipine (NORVASC) 2.5 MG tablet  TAKE 1 TABLET BY MOUTH EVERY DAY 12/18/22  Yes Chilton Si, MD  Ascorbic Acid (VITAMIN C) 1000 MG tablet Take 1,000 mg by mouth daily.   Yes [provider]  aspirin EC 81 MG tablet Take 1 tablet (81 mg total) by mouth daily. 01/12/18  Yes Gottschalk, Ashly M, DO  furosemide (LASIX) 20 MG tablet TAKE 1 TABLET BY MOUTH EVERY DAY 12/18/22  Yes Wendall Stade, MD  hydrALAZINE (APRESOLINE) 25 MG tablet Take 1 tablet (25 mg total) by mouth in the morning and at bedtime. 05/05/22  Yes Chilton Si, MD  Magnesium 500 MG CAPS Take 1 capsule by mouth daily.   Yes [provider]  metoprolol tartrate (LOPRESSOR) 50 MG tablet TAKE 1 TABLET BY MOUTH TWICE A DAY 12/18/22  Yes Wendall Stade, MD  nabumetone (RELAFEN) 500 MG tablet Take 2 tablets (1,000 mg total) by mouth 2 (two) times daily. For muscle and joint pain 12/28/22  Yes Stacks, Broadus John, MD  rosuvastatin (CRESTOR) 10 MG tablet Take 1 tablet (10 mg total) by mouth daily. Pt needs to keep  upcoming appt in Jan, 2025 for additional refills 04/22/23  Yes Chilton Si, MD    Current Medications Scheduled Meds:  aspirin EC  81 mg Oral Daily   budesonide (PULMICORT) nebulizer solution  0.5 mg Nebulization BID   Chlorhexidine Gluconate Cloth  6 each Topical Q0600   enoxaparin (LOVENOX) injection  30 mg Subcutaneous Q24H   feeding supplement  237 mL Oral BID BM   guaiFENesin  600 mg Oral BID   ipratropium-albuterol  3 mL Nebulization Q6H WA   metoprolol tartrate  25 mg Oral BID   oseltamivir  30 mg Oral BID   rosuvastatin  10 mg Oral Daily   Continuous Infusions: PRN Meds:.acetaminophen, albuterol, HYDROcodone bit-homatropine, prochlorperazine  CBC Recent Labs  Lab 06/19/23 1420 06/20/23 0515 06/21/23 0321  WBC 10.2 16.2* 15.0*  NEUTROABS 7.5  --  10.5*  HGB 12.0 11.9* 10.5*  HCT 35.7* 35.6* 31.5*  MCV 89.7 89.2 89.7  PLT 244 254 262   Basic Metabolic Panel Recent Labs  Lab 06/19/23 1420 06/20/23 0515  06/21/23 0321  NA 128* 125* 122*  K 4.4 4.9 4.8  CL 94* 96* 90*  CO2 23 22 21*  GLUCOSE 109* 98 97  BUN 21 24* 30*  CREATININE 1.00 1.19* 1.27*  CALCIUM 8.7* 8.3* 8.2*    Physical Exam  Blood pressure (!) 136/52, pulse 81, temperature 99 F (37.2 C), temperature source Oral, resp. rate 18, height 5\' 3"  (1.6 m), weight 64.6 kg, SpO2 96%. GEN: NAD, elderly female ENT: NCAT EYES: EOMI CV: Regular, normal S1 and S2, no rub PULM: Clear bilaterally but diminished in bases ABD: Soft, nontender SKIN: No rashes or lesions EXT: No peripheral edema NEURO: AAO to self, location, year, the day of the week, month, nonfocal  Assessment 24F with worsening euvolemic hyponatremia.  Euvolemic hyponatremia in the context of #2, likely SIADH Influenza infection receiving oseltamivir AHRF from #2 on 3 L nasal cannula Acute on chronic HFpEF has received diuresis but fairly stable currently Hypertension as an outpatient Likely CKD 3A with creatinine between 1.0 and 1.2 at baseline  Plan 1.2 L fluid restrict at the current time Check serum osmolality, urine sodium, urine osmolality No further furosemide unless overtly volume overloaded Encourage protein intake, meat/eggs Twice daily sodium No indication for vaptan or hypertonic saline We will follow along   [name]  06/21/2023, 9:11 AM

## 2023-06-21 NOTE — Consult Note (Addendum)
 Cardiology Consultation   Patient ID: [name] MRN: [id number]; DOB: 18-May-1935  Admit date: 06/19/2023 Date of Consult: 06/21/2023  PCP:  Raliegh Ip, DO   [facility] Providers Cardiologist:  Charlton Haws, MD        Patient Profile:   [name] is a 88 y.o. female with a hx of  with history of HTN, Severe AS TAVR done 10/19/17 with 20 mm Sapien valve stable moderate PVL by last TTE 02/20/20 mean gradient 16 mmHg. Cath 09/29/17 prior to TAVR with no CAD, palpitations, HLD, fatigue. who is being seen 06/21/2023 for the evaluation of acute CHF at the request of Dr. Arbutus Leas.  History of Present Illness:   [name] with above PMHx was admitted with influenza 06/19/23.She deveolped worsening SOB and leg edema. Found to be hypoxic and came to ED. Renal seeing for hyponatremia with Na 122 today after IV diuresis but patient pushing oral fluids. BNP 1203 on lasix 40 mg bid but now held. Echo yest LVEF >75% hyperdynamic, mod Mitral stenosis, perivalvular leak of aortic prosthesis. See below for details.  Patient doesn't feel well. No further edema. Says she watches her diet closely. Was pushing oral fluids but renal told her to stop.    Past Medical History:  Diagnosis Date   Chronic headaches    Fibromyalgia    GERD (gastroesophageal reflux disease)    Hemorrhoids    INTERNAL--  POST BANDING 02/ 2015   History of kidney stones    Hyperlipidemia    Hypertension    Moderate aortic stenosis    NSAID long-term use 05/30/2013   OSA (obstructive sleep apnea) CPAP INTOLERANT    Osteoporosis    S/P TAVR (transcatheter aortic valve replacement) 10/19/2017   20 mm Edwards Sapien 3 transcatheter heart valve placed via percutaneous right transfemoral approach    Severe aortic stenosis    Spinal stenosis, multilevel     Past Surgical History:  Procedure Laterality Date   ANTERIOR CERVICAL DECOMP/DISCECTOMY FUSION  11-11-1999   C5 - C6   APPENDECTOMY  AGE 65   CARDIAC  CATHETERIZATION  2008  DR NISHAN   ESSENTIALLY NORMAL   CATARACT EXTRACTION Bilateral    CATARACT EXTRACTION W/ INTRAOCULAR LENS  IMPLANT, BILATERAL  2012   EYE SURGERY     FLEXIBLE SIGMOIDOSCOPY N/A 06/01/2013   Procedure: FLEXIBLE SIGMOIDOSCOPY;  Surgeon: Louis Meckel, MD;  Location: [facility];  Service: Endoscopy;  Laterality: N/A;  may need hemorrhoidal banding   HEMORRHOIDECTOMY WITH HEMORRHOID BANDING  08-07-2010   IR RADIOLOGY PERIPHERAL GUIDED IV START  06/27/2019   IR US GUIDE VASC ACCESS RIGHT  06/27/2019   LAPAROSCOPIC CHOLECYSTECTOMY  1990   RIGHT/LEFT HEART CATH AND CORONARY ANGIOGRAPHY N/A 09/29/2017   Procedure: RIGHT/LEFT HEART CATH AND CORONARY ANGIOGRAPHY;  Surgeon: Tonny Bollman, MD;  Location: [facility] INVASIVE CV LAB;  Service: Cardiovascular;  Laterality: N/A;   SHOULDER ARTHROSCOPY WITH OPEN ROTATOR CUFF REPAIR AND DISTAL CLAVICLE ACROMINECTOMY Right 05/25/2012   Procedure: SHOULDER ARTHROSCOPY WITH OPEN ROTATOR CUFF REPAIR AND DISTAL CLAVICLE ACROMINECTOMY;  Surgeon: Drucilla Schmidt, MD;  Location: [facility];  Service: Orthopedics;  Laterality: Right;  RIGHT SHOULDER ARTHROSCOPY WITH DERBRIDEMENTOF LABRAL/BICEP TENDON, OPEN DISTAL CLAVICLE RESECTION, ANTERIOR ACROMINECTOMY ROTATOR CUFF REPAIR ANESTHESIA: GENERAL/SCALENE NERVE BLOCK   TEE WITHOUT CARDIOVERSION Bilateral 10/19/2017   Procedure: TRANSESOPHAGEAL ECHOCARDIOGRAM (TEE);  Surgeon: Tonny Bollman, MD;  Location: [facility] OR;  Service: Open Heart Surgery;  Laterality: Bilateral;   TENOSYNOVECTOMY  Left 01/04/2014   Procedure: LEFT WRIST EXTENSOR TENOSYNOVECTOMY;  Surgeon: Sharma Covert, MD;  Location: [facility];  Service: Orthopedics;  Laterality: Left;   THYROIDECTOMY  AGE 50   GOITER   TONSILLECTOMY AND ADENOIDECTOMY  AGE 28   TRANSCATHETER AORTIC VALVE REPLACEMENT, TRANSFEMORAL  10/19/2017   TRANSCATHETER AORTIC VALVE REPLACEMENT, TRANSFEMORAL Bilateral 10/19/2017   Procedure:  TRANSCATHETER AORTIC VALVE REPLACEMENT, TRANSFEMORAL;  Surgeon: Tonny Bollman, MD;  Location: [facility] OR;  Service: Open Heart Surgery;  Laterality: Bilateral;   TRANSTHORACIC ECHOCARDIOGRAM  last one 04-20-2013  DR [facility]    NORMAL LVSF/ EF 60-65%/ MODERATE  AV  STENOSIS WITH NO AR /  MILD LAE   UMBILICAL HERNIA REPAIR  JUNE 2006   VAGINAL HYSTERECTOMY  01-31-2001   ANTERIOR & POSTERIOR REPAIR/ TRANSVAGINAL BLADDER SLING     Home Medications:  Prior to Admission medications   Medication Sig Start Date End Date Taking? Authorizing Provider  acetaminophen (TYLENOL) 500 MG tablet Take 1,000 mg by mouth daily as needed for moderate pain or headache.   Yes [provider]  amLODipine (NORVASC) 2.5 MG tablet TAKE 1 TABLET BY MOUTH EVERY DAY 12/18/22  Yes Chilton Si, MD  Ascorbic Acid (VITAMIN C) 1000 MG tablet Take 1,000 mg by mouth daily.   Yes [provider]  aspirin EC 81 MG tablet Take 1 tablet (81 mg total) by mouth daily. 01/12/18  Yes Gottschalk, Ashly M, DO  furosemide (LASIX) 20 MG tablet TAKE 1 TABLET BY MOUTH EVERY DAY 12/18/22  Yes Wendall Stade, MD  hydrALAZINE (APRESOLINE) 25 MG tablet Take 1 tablet (25 mg total) by mouth in the morning and at bedtime. 05/05/22  Yes Chilton Si, MD  Magnesium 500 MG CAPS Take 1 capsule by mouth daily.   Yes [provider]  metoprolol tartrate (LOPRESSOR) 50 MG tablet TAKE 1 TABLET BY MOUTH TWICE A DAY 12/18/22  Yes Wendall Stade, MD  nabumetone (RELAFEN) 500 MG tablet Take 2 tablets (1,000 mg total) by mouth 2 (two) times daily. For muscle and joint pain 12/28/22  Yes Stacks, Broadus John, MD  rosuvastatin (CRESTOR) 10 MG tablet Take 1 tablet (10 mg total) by mouth daily. Pt needs to keep upcoming appt in Jan, 2025 for additional refills 04/22/23  Yes Chilton Si, MD    Inpatient Medications: Scheduled Meds:  aspirin EC  81 mg Oral Daily   budesonide (PULMICORT) nebulizer solution  0.5 mg Nebulization BID    Chlorhexidine Gluconate Cloth  6 each Topical Q0600   enoxaparin (LOVENOX) injection  30 mg Subcutaneous Q24H   feeding supplement  237 mL Oral BID BM   guaiFENesin  600 mg Oral BID   ipratropium-albuterol  3 mL Nebulization Q6H WA   metoprolol tartrate  25 mg Oral BID   oseltamivir  30 mg Oral BID   rosuvastatin  10 mg Oral Daily   Continuous Infusions:  PRN Meds: acetaminophen, albuterol, HYDROcodone bit-homatropine, prochlorperazine  Allergies:    Allergies  Allergen Reactions   Prolia [Denosumab] Other (See Comments)    Arthralgia/myalgia/jaw pain/headache   Tape Rash and Other (See Comments)    Dermatitis rash "with extended exposure"    Social History:   Social History   Socioeconomic History   Marital status: Married    Spouse name: Natosha Bou   Number of children: 6   Years of education: 6   Highest education level: 8th grade  Occupational History   Occupation: Retired  Tobacco Use  Smoking status: Former    Current packs/day: 0.00    Average packs/day: 0.3 packs/day for 5.0 years (1.3 ttl pk-yrs)    Types: Cigarettes    Start date: 04/13/1998    Quit date: 04/14/2003    Years since quitting: 20.2    Passive exposure: Past   Smokeless tobacco: Never  Vaping Use   Vaping status: Never Used  Substance and Sexual Activity   Alcohol use: Yes    Comment: once in a while-social   Drug use: Never    Comment: hemp oil    Sexual activity: Not Currently    Partners: Male  Other Topics Concern   Not on file  Social History Narrative   Not on file   Social Drivers of Health   Financial Resource Strain: Low Risk  (11/22/2022)   Overall Financial Resource Strain (CARDIA)    Difficulty of Paying Living Expenses: Not hard at all  Food Insecurity: No Food Insecurity (06/19/2023)   Hunger Vital Sign    Worried About Running Out of Food in the Last Year: Never true    Ran Out of Food in the Last Year: Never true  Transportation Needs: No Transportation Needs  (06/19/2023)   PRAPARE - Administrator, Civil Service (Medical): No    Lack of Transportation (Non-Medical): No  Physical Activity: Inactive (11/22/2022)   Exercise Vital Sign    Days of Exercise per Week: 0 days    Minutes of Exercise per Session: 0 min  Stress: No Stress Concern Present (11/22/2022)   [employer] of Occupational Health - Occupational Stress Questionnaire    Feeling of Stress : Not at all  Social Connections: Moderately Integrated (06/19/2023)   Social Connection and Isolation Panel [NHANES]    Frequency of Communication with Friends and Family: More than three times a week    Frequency of Social Gatherings with Friends and Family: More than three times a week    Attends Religious Services: 1 to 4 times per year    Active Member of [employer] or Organizations: No    Attends Banker Meetings: Never    Marital Status: Married  Catering manager Violence: Not At Risk (06/19/2023)   Humiliation, Afraid, Rape, and Kick questionnaire    Fear of Current or Ex-Partner: No    Emotionally Abused: No    Physically Abused: No    Sexually Abused: No    Family History:     Family History  Problem Relation Age of Onset   Cirrhosis Mother        drinking   Heart disease Sister    Other Brother        duodenal ulcer   Heart failure Maternal Grandmother    Gallbladder disease Maternal Grandmother    Stomach cancer Paternal Grandfather    Cancer Son 12       colon     ROS:  Please see the history of present illness.  Review of Systems  Constitutional: Positive for fever, malaise/fatigue and weight gain.  Cardiovascular:  Positive for dyspnea on exertion and leg swelling.  Respiratory:  Positive for cough and shortness of breath.   Neurological:  Positive for weakness.    All other ROS reviewed and negative.     Physical Exam/Data:   Vitals:   06/20/23 2037 06/20/23 2139 06/21/23 0600 06/21/23 0755  BP:  (!) 140/53 (!) 136/52   Pulse:   81    Resp:      Temp:  99 F (37.2 C)   TempSrc:   Oral   SpO2: 96%  96% 96%  Weight:   64.6 kg   Height:        Intake/Output Summary (Last 24 hours) at 06/21/2023 1046 Last data filed at 06/21/2023 0900 Gross per 24 hour  Intake 480 ml  Output --  Net 480 ml      06/21/2023    6:00 AM 06/19/2023    1:47 PM 05/13/2023   10:41 AM  Last 3 Weights  Weight (lbs) 142 lb 6.7 oz 148 lb 150 lb  Weight (kg) 64.6 kg 67.132 kg 68.04 kg     Body mass index is 25.23 kg/m.  General:  Well nourished, well developed, in no acute distress  HEENT: normal Neck: no JVD Vascular: No carotid bruits; Distal pulses 2+ bilaterally Cardiac:  normal S1, S2; RRR; 1/6 systolic murmur LSB Lungs:  diffuse rhonchi Abd: soft, nontender, no hepatomegaly  Ext: no edema Musculoskeletal:  No deformities, BUE and BLE strength normal and equal Skin: warm and dry  Neuro:  CNs 2-12 intact, no focal abnormalities noted Psych:  Normal affect   EKG:  The EKG was personally reviewed and demonstrates:  NSR, nonspecific ST changes, no acute change Telemetry:  Telemetry was personally reviewed and demonstrates:  NSR  Relevant CV Studies:  Echo 06/20/23 IMPRESSIONS     1. Left ventricular ejection fraction, by estimation, is >75%. The left  ventricle has hyperdynamic function. The left ventricle has no regional  wall motion abnormalities. Left ventricular diastolic parameters are  indeterminate.   2. Right ventricular systolic function is normal. The right ventricular  size is normal. Tricuspid regurgitation signal is inadequate for assessing  PA pressure.   3. The mitral valve is abnormal. No evidence of mitral valve  regurgitation. Moderate mitral stenosis. The mean mitral valve gradient is  6.0 mmHg. Moderate mitral annular calcification.   4. The aortic valve was not well visualized. Aortic valve regurgitation  is not visualized. No aortic stenosis is present. There is a 20 mm Edwards  Sapien prosthetic (TAVR)  valve present in the aortic position. Procedure  Date: 10/19/2017. Echo findings are   consistent with perivalvular leak of the aortic prosthesis.   5. The inferior vena cava is normal in size with greater than 50%  respiratory variability, suggesting right atrial pressure of 3 mmHg.  Aortic Valve: The aortic valve was not well visualized. Aortic valve  regurgitation is not visualized. No aortic stenosis is present. Aortic  valve mean gradient measures 9.6 mmHg. Aortic valve peak gradient measures  18.0 mmHg. Aortic valve area, by VTI  measures 1.66 cm. There is a 20 mm Edwards Sapien prosthetic, stented  (TAVR) valve present in the aortic position. Procedure Date: 10/19/2017.  Laboratory Data:  High Sensitivity Troponin:   Recent Labs  Lab 06/19/23 1420 06/19/23 1557 06/19/23 2251 06/20/23 0515  TROPONINIHS 384* 333* 221* 220*     Chemistry Recent Labs  Lab 06/19/23 1420 06/20/23 0515 06/21/23 0321  NA 128* 125* 122*  K 4.4 4.9 4.8  CL 94* 96* 90*  CO2 23 22 21*  GLUCOSE 109* 98 97  BUN 21 24* 30*  CREATININE 1.00 1.19* 1.27*  CALCIUM 8.7* 8.3* 8.2*  MG  --   --  2.3  GFRNONAA 55* 45* 41*  ANIONGAP 11 7 11     Recent Labs  Lab 06/19/23 1420  PROT 7.0  ALBUMIN 3.1*  AST 48*  ALT 32  ALKPHOS 49  BILITOT 0.7   Lipids No results for input(s): "CHOL", "TRIG", "HDL", "LABVLDL", "LDLCALC", "CHOLHDL" in the last 168 hours.  Hematology Recent Labs  Lab 06/19/23 1420 06/20/23 0515 06/21/23 0321  WBC 10.2 16.2* 15.0*  RBC 3.98 3.99 3.51*  HGB 12.0 11.9* 10.5*  HCT 35.7* 35.6* 31.5*  MCV 89.7 89.2 89.7  MCH 30.2 29.8 29.9  MCHC 33.6 33.4 33.3  RDW 12.6 12.4 12.9  PLT 244 254 262   Thyroid No results for input(s): "TSH", "FREET4" in the last 168 hours.  BNP Recent Labs  Lab 06/19/23 1420  BNP 1,203.0*    DDimer No results for input(s): "DDIMER" in the last 168 hours.   Radiology/Studies:  ECHOCARDIOGRAM COMPLETE Result Date: 06/20/2023    ECHOCARDIOGRAM  REPORT   Patient Name:   CHRISTELL STEINMILLER Date of Exam: 06/20/2023 Medical Rec #:  [id number]   Height:       63.0 in Accession #:    [id number]  Weight:       148.0 lb Date of Birth:  1935-09-12   BSA:          1.701 m Patient Age:    87 years    BP:           127/57 mmHg Patient Gender: F           HR:           103 bpm. [facility] Procedure: 2D Echo, Cardiac Doppler and Color Doppler (Both Spectral and Color            Flow Doppler were utilized during procedure). Indications:    I50.40* Unspecified combined systolic (congestive) and diastolic                 (congestive) heart failure  History:        Patient has prior history of Echocardiogram examinations, most                 recent 12/24/2021. Abnormal ECG, Aortic Valve Disease;                 Signs/Symptoms:Shortness of Breath and Dyspnea. FLU positive.                 Aortic stenosis. TAVR. Pulmonary edema.                 Aortic Valve: 20 mm Edwards Sapien prosthetic, stented (TAVR)                 valve is present in the aortic position. Procedure Date:                 10/19/2017.  Sonographer:    Sheralyn Boatman RDCS Referring Phys: [phone] JACOB J STINSON  Sonographer Comments: Technically difficult study due to poor echo windows, suboptimal parasternal window and suboptimal apical window. IMPRESSIONS  1. Left ventricular ejection fraction, by estimation, is >75%. The left ventricle has hyperdynamic function. The left ventricle has no regional wall motion abnormalities. Left ventricular diastolic parameters are indeterminate.  2. Right ventricular systolic function is normal. The right ventricular size is normal. Tricuspid regurgitation signal is inadequate for assessing PA pressure.  3. The mitral valve is abnormal. No evidence of mitral valve regurgitation. Moderate mitral stenosis. The mean mitral valve gradient is 6.0 mmHg. Moderate mitral annular calcification.  4. The aortic valve was not well visualized. Aortic valve regurgitation is not  visualized. No aortic stenosis is present. There is a 20 mm  Edwards Sapien prosthetic (TAVR) valve present in the aortic position. Procedure Date: 10/19/2017. Echo findings are  consistent with perivalvular leak of the aortic prosthesis.  5. The inferior vena cava is normal in size with greater than 50% respiratory variability, suggesting right atrial pressure of 3 mmHg. FINDINGS  Left Ventricle: Left ventricular ejection fraction, by estimation, is >75%. The left ventricle has hyperdynamic function. The left ventricle has no regional wall motion abnormalities. The left ventricular internal cavity size was normal in size. There is no left ventricular hypertrophy. Left ventricular diastolic parameters are indeterminate. Right Ventricle: The right ventricular size is normal. Right vetricular wall thickness was not well visualized. Right ventricular systolic function is normal. Tricuspid regurgitation signal is inadequate for assessing PA pressure. Left Atrium: Left atrial size was normal in size. Right Atrium: Right atrial size was normal in size. Pericardium: There is no evidence of pericardial effusion. Mitral Valve: The mitral valve is abnormal. There is moderate thickening of the mitral valve leaflet(s). There is moderate calcification of the mitral valve leaflet(s). Moderate mitral annular calcification. No evidence of mitral valve regurgitation. Moderate mitral valve stenosis. MV peak gradient, 14.1 mmHg. The mean mitral valve gradient is 6.0 mmHg. Tricuspid Valve: The tricuspid valve is normal in structure. Tricuspid valve regurgitation is not demonstrated. No evidence of tricuspid stenosis. Aortic Valve: The aortic valve was not well visualized. Aortic valve regurgitation is not visualized. No aortic stenosis is present. Aortic valve mean gradient measures 9.6 mmHg. Aortic valve peak gradient measures 18.0 mmHg. Aortic valve area, by VTI measures 1.66 cm. There is a 20 mm Edwards Sapien prosthetic, stented  (TAVR) valve present in the aortic position. Procedure Date: 10/19/2017. Pulmonic Valve: The pulmonic valve was not well visualized. Pulmonic valve regurgitation is not visualized. No evidence of pulmonic stenosis. Aorta: The aortic root is normal in size and structure and the ascending aorta was not well visualized. Venous: The inferior vena cava is normal in size with greater than 50% respiratory variability, suggesting right atrial pressure of 3 mmHg. IAS/Shunts: The interatrial septum was not well visualized.  LEFT VENTRICLE PLAX 2D LVIDd:         4.60 cm     Diastology LVIDs:         2.40 cm     LV e' medial:    8.05 cm/s LV PW:         1.00 cm     LV E/e' medial:  8.2 LV IVS:        0.90 cm     LV e' lateral:   4.03 cm/s LVOT diam:     2.00 cm     LV E/e' lateral: 16.3 LV SV:         55 LV SV Index:   32 LVOT Area:     3.14 cm  LV Volumes (MOD) LV vol d, MOD A2C: 35.0 ml LV vol d, MOD A4C: 56.9 ml LV vol s, MOD A2C: 19.7 ml LV vol s, MOD A4C: 17.7 ml LV SV MOD A2C:     15.3 ml LV SV MOD A4C:     56.9 ml LV SV MOD BP:      27.2 ml RIGHT VENTRICLE             IVC RV S prime:     12.80 cm/s  IVC diam: 1.40 cm TAPSE (M-mode): 1.1 cm LEFT ATRIUM             Index  RIGHT ATRIUM          Index LA diam:        3.70 cm 2.17 cm/m   RA Area:     8.11 cm LA Vol (A2C):   23.2 ml 13.64 ml/m  RA Volume:   11.80 ml 6.94 ml/m LA Vol (A4C):   22.4 ml 13.17 ml/m LA Biplane Vol: 22.9 ml 13.46 ml/m  AORTIC VALVE AV Area (Vmax):    1.85 cm AV Area (Vmean):   1.81 cm AV Area (VTI):     1.66 cm AV Vmax:           211.92 cm/s AV Vmean:          139.841 cm/s AV VTI:            0.332 m AV Peak Grad:      18.0 mmHg AV Mean Grad:      9.6 mmHg LVOT Vmax:         125.00 cm/s LVOT Vmean:        80.625 cm/s LVOT VTI:          0.176 m LVOT/AV VTI ratio: 0.53  AORTA Ao Root diam: 2.60 cm MITRAL VALVE MV Area (PHT): 4.68 cm     SHUNTS MV Area VTI:   2.36 cm     Systemic VTI:  0.18 m MV Peak grad:  14.1 mmHg    Systemic Diam:  2.00 cm MV Mean grad:  6.0 mmHg MV Vmax:       1.88 m/s MV Vmean:      106.0 cm/s MV Decel Time: 162 msec MV E velocity: 65.80 cm/s MV A velocity: 147.00 cm/s MV E/A ratio:  0.45 Dina Rich MD Electronically signed by Dina Rich MD Signature Date/Time: 06/20/2023/5:46:01 PM    Final    CT CHEST WO CONTRAST Result Date: 06/20/2023 CLINICAL DATA:  Pneumonia, complication suspected, xray done Respiratory illness, nondiagnostic xray EXAM: CT CHEST WITHOUT CONTRAST TECHNIQUE: Multidetector CT imaging of the chest was performed following the standard protocol without IV contrast. RADIATION DOSE REDUCTION: This exam was performed according to the departmental dose-optimization program which includes automated exposure control, adjustment of the mA and/or kV according to patient size and/or use of iterative reconstruction technique. COMPARISON:  Chest radiograph from one day prior. 02/26/2023 chest CT FINDINGS: Cardiovascular: Normal heart size. No significant pericardial effusion/thickening. TAVR in place. Three-vessel coronary atherosclerosis. Atherosclerotic nonaneurysmal thoracic aorta. Normal caliber pulmonary arteries. Mediastinum/Nodes: No significant thyroid nodules. Unremarkable esophagus. No pathologically enlarged axillary, mediastinal or hilar lymph nodes, noting limited sensitivity for the detection of hilar adenopathy on this noncontrast study. Lungs/Pleura: No pneumothorax. No pleural effusion. Diffuse bronchial wall thickening. Extensive patchy centrilobular nodularity and tree-in-bud opacity throughout both lungs with scattered mild-to-moderate regions of patchy consolidation in the lower lobes bilaterally, compatible with multilobar bronchopneumonia. No discrete lung masses. No bronchiectasis. Upper abdomen: Nonobstructing 2 mm upper left renal stone. Exophytic 1.3 cm posterior upper right renal cyst with layering calcification, stable size, compatible with a Bosniak category 2 renal cyst for  which no follow-up imaging is recommended. Musculoskeletal: No aggressive appearing focal osseous lesions. Moderate thoracic spondylosis. IMPRESSION: 1. Multilobar bronchopneumonia, most severe in the lower lobes. 2. Three-vessel coronary atherosclerosis. 3. Nonobstructing left nephrolithiasis. Electronically Signed   By: Delbert Phenix M.D.   On: 06/20/2023 10:21   DG Chest Port 1 View Result Date: 06/19/2023 CLINICAL DATA:  Dyspnea for 1 week EXAM: PORTABLE CHEST 1 VIEW COMPARISON:  11/23/2022 chest radiograph. FINDINGS: TAVR  in place. Partially visualized surgical hardware from ACDF. Stable cardiomediastinal silhouette with normal heart size. No pneumothorax. No pleural effusion. Generalized mild prominence of the parahilar interstitial markings. No consolidative airspace disease. IMPRESSION: Generalized mild prominence of the parahilar interstitial markings, which could represent mild pulmonary edema or atypical/viral infection. Electronically Signed   By: Delbert Phenix M.D.   On: 06/19/2023 14:26     Assessment and Plan:   Acute on chronic diastolic CHF patient was diuresed with IV lasix but now held due to hyponatremia and elevated Crt. Echo yest hyperdynamic LVEF, mod MS, aortic valve mean gradient 9.6. CHF currently compensated.amlodipine, hydralazine on hold  Acute respiratory failure with hypoxia/multilobar bronchopneumonia secondary to influenza  Hyponatremia-Na 122, being seen by renal-lasix on hold  Elevated troponin [phone] felt due to demand ischemia. No CAD on cath 2019, 3 vessel coronary atherosclerosis on CT yest.  HTN-BP up today. Would resume amlodipine.   HLD-on crestor     Risk Assessment/Risk Scores:        New York Heart Association (NYHA) Functional Class NYHA Class II        For questions or updates, please contact [facility] Please consult [email or web address] for contact info under    Signed, Jacolyn Reedy, PA-C  06/21/2023 10:46  AM  Attending NOte  Patient seen and discussed with PA Geni Bers, I agree with her documentation. 88 yo female history of HTN, aortic stenosis s/p TAVR with moderate PVL, admitted with SOB and cough, Denies any chest pains, no LE edema. In ER placed on bipap   Na 128 K 4.4 Cr 1 BUN 21 Lactic acid 1.5 WBC 10.2 Hgb 12 Plt 244 BNP 1203 Procalc 0.64  Flu + Trop 384-->333-->221-->220 EKG SR, no acute ischemic changes CXR mild edema vs atypical infection CT chest: Multilobar bronchopneumonia, most severe in the lower lobes.   06/2023 echo: LVEF > 75%, no WMAs, mod MS mean grad 6, normal AVR    1.Elevated troponin - in setting of influenza pneumonia, hypoxia - 2019 cath patent coronaries - trop trending down, EKG without acute ischemic changes - echo hyperdynamic LV function, no WMAs, normal AVR - suspect demand ischemia, no plans for ischemic testing at this time  2. Hypoxia/SOB - in setting of influenza pneumonia - elevated BNP on admission, received some IV diuresis. Appears euvolemic at this time, echo was overall benign - no plans for additional diuresis at this time  No additional cardiology recs at this time, we will sign off inpatient care.   Dina Rich MD

## 2023-06-21 NOTE — Plan of Care (Signed)

## 2023-06-21 NOTE — Progress Notes (Incomplete)
 Attending NOte  Patient seen and discussed with PA Geni Bers, I agree with her documentation. 88 yo female history of HTN, aortic stenosis s/p TAVR with moderate PVL, admitted with SOB and cough, Denies any chest pains, no LE edema. In ER placed on bipap   Na 128 K 4.4 Cr 1 BUN 21 Lactic acid 1.5 WBC 10.2 Hgb 12 Plt 244 BNP 1203 Procalc 0.64  Flu + Trop 384-->333-->221-->220 EKG SR, no acute ischemic changes CXR mild edema vs atypical infection CT chest: Multilobar bronchopneumonia, most severe in the lower lobes.   06/2023 echo: LVEF > 75%, no WMAs, mod MS mean grad 6, normal AVR    1.Elevated troponin - in setting of influenza pneumonia, hypoxia - 2019 cath patent coronaries - trop trending down, EKG without acute ischemic changes - echo hyperdynamic LV function, no WMAs, normal AVR - suspect demand ischemia, no plans for ischemic testing at this time  2. Hypoxia/SOB - in setting of influenza pneumonia - elevated BNP on admission, received some IV diuresis. Appears euvolemic at this time, echo was overall benign - no palns for additional diuresis at this time  No additional cardiology recs at this time, we will sign off inpatient care.   Dina Rich MD

## 2023-06-22 DIAGNOSIS — E871 Hypo-osmolality and hyponatremia: Secondary | ICD-10-CM | POA: Diagnosis not present

## 2023-06-22 DIAGNOSIS — R7989 Other specified abnormal findings of blood chemistry: Secondary | ICD-10-CM | POA: Diagnosis not present

## 2023-06-22 DIAGNOSIS — J09X1 Influenza due to identified novel influenza A virus with pneumonia: Secondary | ICD-10-CM | POA: Diagnosis not present

## 2023-06-22 DIAGNOSIS — J9601 Acute respiratory failure with hypoxia: Secondary | ICD-10-CM | POA: Diagnosis not present

## 2023-06-22 LAB — CBC
HCT: 32.1 % — ABNORMAL LOW (ref 36.0–46.0)
Hemoglobin: 10.7 g/dL — ABNORMAL LOW (ref 12.0–15.0)
MCH: 29.6 pg (ref 26.0–34.0)
MCHC: 33.3 g/dL (ref 30.0–36.0)
MCV: 88.9 fL (ref 80.0–100.0)
Platelets: 297 10*3/uL (ref 150–400)
RBC: 3.61 MIL/uL — ABNORMAL LOW (ref 3.87–5.11)
RDW: 12.3 % (ref 11.5–15.5)
WBC: 14.5 10*3/uL — ABNORMAL HIGH (ref 4.0–10.5)
nRBC: 0 % (ref 0.0–0.2)

## 2023-06-22 LAB — BASIC METABOLIC PANEL
Anion gap: 9 (ref 5–15)
BUN: 27 mg/dL — ABNORMAL HIGH (ref 8–23)
CO2: 23 mmol/L (ref 22–32)
Calcium: 8.5 mg/dL — ABNORMAL LOW (ref 8.9–10.3)
Chloride: 93 mmol/L — ABNORMAL LOW (ref 98–111)
Creatinine, Ser: 1.12 mg/dL — ABNORMAL HIGH (ref 0.44–1.00)
GFR, Estimated: 48 mL/min — ABNORMAL LOW (ref >60)
Glucose, Bld: 103 mg/dL — ABNORMAL HIGH (ref 70–99)
Potassium: 4.9 mmol/L (ref 3.5–5.1)
Sodium: 125 mmol/L — ABNORMAL LOW (ref 135–145)

## 2023-06-22 LAB — SODIUM: Sodium: 127 mmol/L — ABNORMAL LOW (ref 135–145)

## 2023-06-22 LAB — CREATININE, URINE, RANDOM: Creatinine, Urine: 40 mg/dL

## 2023-06-22 LAB — OSMOLALITY, URINE: Osmolality, Ur: 299 mosm/kg — ABNORMAL LOW (ref 300–900)

## 2023-06-22 LAB — PHOSPHORUS: Phosphorus: 3.8 mg/dL (ref 2.5–4.6)

## 2023-06-22 LAB — SODIUM, URINE, RANDOM: Sodium, Ur: 90 mmol/L

## 2023-06-22 NOTE — Progress Notes (Signed)
 Heart Failure Navigator Progress Note  Assessed for Heart & Vascular TOC clinic readiness.  Patient does not meet criteria due to history of Aortic Stenosis s/p TAVR in the setting of influenza pneumonia with hypoxia.  Navigator will sign off at this time.  Roxy Horseman, RN, BSN [facility] Heart Failure Navigator Secure Chat Only

## 2023-06-22 NOTE — Progress Notes (Signed)
 [facility] Progress Note  Name: LARINE FIELDING MRN: [id number] DOB: 22-Sep-1935  Chief Complaint:  Fevers, shortness of breath  Subjective:  Strict ins/outs are not available.  She had three unmeasured urine voids over 3/10.  Note that home meds include lasix 20 mg daily and an NSAID, nabumetone.  It's been rough with the flu  Review of systems:  She denies shortness of breath - breathing is better than it was on arrival.  She is still on oxygen here and doesn't wear this at home.  Denies n/v No chest pain  --------------- Background on consult: 51F presented to the ED 06/19/2023 with AHRF, fevers, headache, arthralgias/myalgias and found to have influenza.  Admitted to [facility], placed on oseltamavir.   PMH Includes: Severe AS status post TAVR 10/2017 Hypertension Hyperlipidemia GERD Remote cholecystectomy, hysterectomy   Patient appears to have fairly preserved GFR.  Admitted with serum sodium of 128 and in September 2024 had a normal value.  Serum sodium has worsened to 122 today.  See below.  Has received intermittent dosing of furosemide to treat any potential pulmonary edema.  She has been drinking her fluids thinking it would be helpful, pushing water.  Appetite is poor.  Urine studies not yet provided because of furosemide dosing.  No edema.  She is lying flat in bed on 3 L nasal cannula and breathing comfortably.  UOP does not appear to be fully quantified.  She currently has a mild headache but no nausea/vomiting.  She is oriented to self, location, year, month, day of the week.     Intake/Output Summary (Last 24 hours) at 06/22/2023 0946 Last data filed at 06/22/2023 0917 Gross per 24 hour  Intake 820 ml  Output --  Net 820 ml    Vitals:  Vitals:   06/21/23 2120 06/22/23 0438 06/22/23 0607 06/22/23 0855  BP:  (!) 136/42    Pulse:  82    Resp:  17    Temp:  98.9 F (37.2 C)    TempSrc:  Oral    SpO2: 91% 93%  93%  Weight:   64.7 kg    Height:         Physical Exam:  General elderly female in bed in no acute distress HEENT normocephalic atraumatic extraocular movements intact sclera anicteric Neck supple trachea midline Lungs clear but reduced on auscultation; on oxygen per nasal cannula. Normal work of breathing Heart S1S2 no rub  Abdomen soft nontender nondistended Extremities no edema  Psych normal mood and affect Neuro awake and conversant; provides hx and follows commands    Medications reviewed   Labs:     Latest Ref Rng & Units 06/22/2023    4:11 AM 06/21/2023    6:03 PM 06/21/2023    3:21 AM  BMP  Glucose 70 - 99 mg/dL 696   97   BUN 8 - 23 mg/dL 27   30   Creatinine 2.95 - 1.00 mg/dL 2.84   1.32   Sodium 440 - 145 mmol/L 125  124  122   Potassium 3.5 - 5.1 mmol/L 4.9   4.8   Chloride 98 - 111 mmol/L 93   90   CO2 22 - 32 mmol/L 23   21   Calcium 8.9 - 10.3 mg/dL 8.5   8.2      Assessment/Plan:   51F with worsening euvolemic hyponatremia.   Euvolemic hyponatremia in the context of influenza, likely SIADH 1.2 liters fluid restriction  No further furosemide  unless overtly volume overloaded Twice daily sodium  urine sodium is inappropriately elevated; urine osmolality is pending Influenza infection receiving oseltamivir AHRF from influenza - on nasal cannula  Acute on chronic HFpEF off of her home lasix for now Hypertension as an outpatient - acceptable control  Likely CKD 3A with creatinine between 1.0 and 1.2 at baseline  Disposition - would continue inpatient monitoring     Estanislado Emms, MD 06/22/2023 10:05 AM

## 2023-06-22 NOTE — TOC Progression Note (Signed)
 Transition of Care [facility]) - Progression Note    Patient Details  Name: [name] MRN: [id number] Date of Birth: 15-Jul-1935  [facility] CM/SW Contact  Villa Herb, [place] Phone Number: 06/22/2023, 11:06 AM  Clinical Narrative:    Pt is high risk for readmission. CSW spoke with pts daughter to complete assessment. Pt lives with her spouse and daughter. Pt is independent in completing her ADLs. Pt drives when able but family can also transport if needed. Pt has not had HH and does not use any DME. Pt does not wear O2 at home. TOC to follow.   Expected Discharge Plan: Home/Self Care Barriers to Discharge: Continued Medical Work up  Expected Discharge Plan and Services       Living arrangements for the past 2 months: Single Family Home                                       Social Determinants of Health (SDOH) Interventions SDOH Screenings   Food Insecurity: No Food Insecurity (06/19/2023)  Housing: Low Risk  (06/19/2023)  Transportation Needs: No Transportation Needs (06/19/2023)  Utilities: Not At Risk (06/19/2023)  Alcohol Screen: Low Risk  (08/18/2022)  Depression (PHQ2-9): Low Risk  (05/13/2023)  Financial Resource Strain: Low Risk  (11/22/2022)  Physical Activity: Inactive (11/22/2022)  Social Connections: Moderately Integrated (06/19/2023)  Stress: No Stress Concern Present (11/22/2022)  Tobacco Use: Medium Risk (06/19/2023)    Readmission Risk Interventions    06/22/2023   11:06 AM  Readmission Risk Prevention Plan  Transportation Screening Complete  HRI or Home Care Consult Complete  Social Work Consult for Recovery Care Planning/Counseling Complete  Palliative Care Screening Not Applicable  Medication Review Oceanographer) Complete

## 2023-06-22 NOTE — Progress Notes (Signed)
 Bipap is PRN order, no distress noted at this time to warrant usage.

## 2023-06-22 NOTE — Progress Notes (Signed)
 PROGRESS NOTE  [name] QMV:[id number] DOB: Dec 01, 1935 DOA: 06/19/2023 PCP: Raliegh Ip, DO  Brief History:  88 year old female with a history of severe AS s/p TAVR 10/2017, hypertension, hyperlipidemia, fibromyalgia presenting with 1 week history of subjective fevers, chills, coughing, shortness of breath, and myalgias and arthralgias.  The patient had been using over-the-counter essential oils and other homeopathic remedies without much relief.  Her coughing and shortness of breath progressed.  She denied any worsening lower extremity edema.  She went to urgent care on the morning of 06/19/2023.  Apparently the patient was noted to be hypoxic and sent to the emergency department for further evaluation and treatment. The patient denied any chest pain or anginal symptoms in the past week. Her cough has been largely nonproductive.  She denies any hemoptysis.  She has had some nausea without emesis.  She denies any abdominal pain, diarrhea, hematochezia, melena, dysuria, hematuria. She has been having diffuse arthralgias and myalgias. She complained of a frontal headache without any visual disturbance or focal extremity weakness. She states that her daughter recently had influenza.  In the ED, the patient was febrile up to 101.6 F.  She had increased work of breathing and was placed on BiPAP.  She was hemodynamically stable.  She was tachycardic 100-110.  WBC 10.0, hemoglobin 12.0, platelets 254.  Sodium 120, potassium 4.4, bicarbonate 23, serum creatinine 1.00.  BNP was 1203 so, the patient was given furosemide 40 mg IV x 2.  She was started on bronchodilators.  She was started on oseltamavir. She was weaned off of BiPAP and placed on 3 L.    Assessment/Plan: Acute respiratory failure with hypoxia -Secondary to influenza pneumonia -Certainly, there may have been a degree of fluid overload -Initially on BiPAP -Currently stable on 3 L>>2L -VBG--7.44/40/44/27 -Wean oxygen as  tolerated -COVID-19 PCR negative -CT chest---diffuse bronchial wall thickening;  patchy tree-in-bud opacities bilateral; patchy consolidation bilateral LL -PCT 0.64>>0.67 -continue empiric azithro/ceftriaxone   Influenza pneumonitis/Lobar pneumonia -Continue oseltamavir -Continue bronchodilators -Lactic acid 1.5 -PCT 0.64>>0.67 -continue empiric azithro/ceftriaxone   Acute on chronic HFpEF?? -Certainly, the patient may have had a degree of fluid overload -BNP 1203 -Given furosemide 40 mg IV x 2 -Weaned off of BiPAP after furosemide -Holding further furosemide dosing---now clinically euvolemic -12/24/2021 echo EF 65 to 70%, no WMA, grade 1 DD, normal RVF -3/9 echo--EF >75%, no WMA, indeterminant diastology. normal RVF, moderate MS  Hyponatremia/SIADH -Hold further dosing furosemide for now and monitor clinically -Patient had received 2 doses of IV furosemide>> defer urine studies  -Check serum osmolarity--271 -Urine osm 299 -appreciate nephrology>>SIADH due to acute illness -1.2L fluid restrict>>Na improving  Elevated troponin -Secondary to demand ischemia -12/24/2021 echo EF 65 to 70%, no WMA, grade 1 DD, normal RVF, mild MR -No chest pain or anginal type symptoms -Troponin 384>> 333>> 221>> 220 -3/9 echo--EF >75%, no WMA, indeterminant diastology. normal RVF, moderate MS   Essential hypertension -Holding hydralazine temporarily and monitor BP -Continue metoprolol -Holding amlodipine temporarily and monitor BP   Mixed hyperlipidemia -Continue Crestor   Leukemoid reaction -UA--bland -PCT--0.64 -lactate 1.5 -follow blood culture--neg to date   CKD 3a -baseline creatinine 0.9-1.1        Family Communication:   no Family at bedside  Consultants:  renal  Code Status:  FULL  DVT Prophylaxis:   [place] Lovenox   Procedures: As Listed in Progress Note Above   Antibiotics: Ceftriaxone 3/10>> Azithro 3/10>>  Subjective: Pt feeling better  today.  States sob is improving.  Denies f/c, cp, n/v/d, abd pain  Objective: Vitals:   06/22/23 0607 06/22/23 0855 06/22/23 1437 06/22/23 1500  BP:   (!) 135/40   Pulse:   73   Resp:   20   Temp:   98.9 F (37.2 C)   TempSrc:   Oral   SpO2:  93% 98% 97%  Weight: 64.7 kg     Height:        Intake/Output Summary (Last 24 hours) at 06/22/2023 1742 Last data filed at 06/22/2023 1436 Gross per 24 hour  Intake 1060 ml  Output --  Net 1060 ml   Weight change: 0.1 kg Exam:  General:  Pt is alert, follows commands appropriately, not in acute distress HEENT: No icterus, No thrush, No neck mass, [place]/AT Cardiovascular: RRR, S1/S2, no rubs, no gallops Respiratory: bilateral rales.  No wheeze Abdomen: Soft/+BS, non tender, non distended, no guarding Extremities: No edema, No lymphangitis, No petechiae, No rashes, no synovitis   Data Reviewed: I have personally reviewed following labs and imaging studies Basic Metabolic Panel: Recent Labs  Lab 06/19/23 1420 06/20/23 0515 06/21/23 0321 06/21/23 1803 06/22/23 0411 06/22/23 1407  NA 128* 125* 122* 124* 125* 127*  K 4.4 4.9 4.8  --  4.9  --   CL 94* 96* 90*  --  93*  --   CO2 23 22 21*  --  23  --   GLUCOSE 109* 98 97  --  103*  --   BUN 21 24* 30*  --  27*  --   CREATININE 1.00 1.19* 1.27*  --  1.12*  --   CALCIUM 8.7* 8.3* 8.2*  --  8.5*  --   MG  --   --  2.3  --   --   --   PHOS  --   --   --   --  3.8  --    Liver Function Tests: Recent Labs  Lab 06/19/23 1420  AST 48*  ALT 32  ALKPHOS 49  BILITOT 0.7  PROT 7.0  ALBUMIN 3.1*   No results for input(s): "LIPASE", "AMYLASE" in the last 168 hours. No results for input(s): "AMMONIA" in the last 168 hours. Coagulation Profile: No results for input(s): "INR", "PROTIME" in the last 168 hours. CBC: Recent Labs  Lab 06/19/23 1420 06/20/23 0515 06/21/23 0321 06/22/23 0411  WBC 10.2 16.2* 15.0* 14.5*  NEUTROABS 7.5  --  10.5*  --   HGB 12.0 11.9* 10.5* 10.7*  HCT  35.7* 35.6* 31.5* 32.1*  MCV 89.7 89.2 89.7 88.9  PLT 244 254 262 297   Cardiac Enzymes: No results for input(s): "CKTOTAL", "CKMB", "CKMBINDEX", "TROPONINI" in the last 168 hours. BNP: Invalid input(s): "POCBNP" CBG: No results for input(s): "GLUCAP" in the last 168 hours. HbA1C: No results for input(s): "HGBA1C" in the last 72 hours. Urine analysis:    Component Value Date/Time   COLORURINE YELLOW 06/20/2023 0856   APPEARANCEUR CLEAR 06/20/2023 0856   APPEARANCEUR Cloudy (A) 09/22/2021 1542   LABSPEC 1.008 06/20/2023 0856   PHURINE 5.0 06/20/2023 0856   GLUCOSEU NEGATIVE 06/20/2023 0856   HGBUR MODERATE (A) 06/20/2023 0856   BILIRUBINUR NEGATIVE 06/20/2023 0856   BILIRUBINUR Negative 09/22/2021 1542   KETONESUR NEGATIVE 06/20/2023 0856   PROTEINUR NEGATIVE 06/20/2023 0856   UROBILINOGEN negative 06/03/2015 1650   UROBILINOGEN 1.0 05/29/2013 1845   NITRITE NEGATIVE 06/20/2023 0856   LEUKOCYTESUR NEGATIVE 06/20/2023 0856  Sepsis Labs: @LABRCNTIP (procalcitonin:4,lacticidven:4) ) Recent Results (from the past 240 hours)  Culture, blood (routine x 2)     Status: None (Preliminary result)   Collection Time: 06/19/23  2:20 PM   Specimen: BLOOD  Result Value Ref Range Status   Specimen Description   Final    BLOOD RIGHT ANTECUBITAL Performed at [facility], [address], [place], [place] [address]    Special Requests   Final    BOTTLES DRAWN AEROBIC AND ANAEROBIC Blood Culture adequate volume Performed at [facility], [address] [place], [place] [address]    Culture   Final    NO GROWTH 3 DAYS Performed at [facility], [address], [place], [place] [address]    Report Status PENDING  Incomplete  Culture, blood (routine x 2)     Status: None (Preliminary result)   Collection Time: 06/19/23  2:20 PM   Specimen: BLOOD  Result Value Ref Range Status   Specimen Description   Final    BLOOD LEFT ANTECUBITAL Performed at [facility], [address] [place], [place] [address]    Special Requests   Final    BOTTLES DRAWN AEROBIC AND ANAEROBIC Blood Culture adequate volume Performed at [facility], [address], [place], [place] [address]    Culture   Final    NO GROWTH 3 DAYS Performed at [facility], [address] [place], [place] [address]    Report Status PENDING  Incomplete  Resp panel by RT-PCR (RSV, Flu A&B, Covid) Anterior Nasal Swab     Status: Abnormal   Collection Time: 06/19/23  2:31 PM   Specimen: Anterior Nasal Swab  Result Value Ref Range Status   SARS Coronavirus 2 by RT PCR NEGATIVE NEGATIVE Final    Comment: (NOTE) SARS-CoV-2 target nucleic acids are NOT DETECTED.  The SARS-CoV-2 RNA is generally detectable in upper respiratory specimens during the acute phase of infection. The lowest concentration of SARS-CoV-2 viral copies this assay can detect is 138 copies/mL. A negative result does not preclude SARS-Cov-2 infection and should not be used as the sole basis for treatment or other patient management decisions. A negative result may occur with  improper specimen collection/handling, submission of specimen other than nasopharyngeal swab, presence of viral mutation(s) within the areas targeted by this assay, and inadequate number of viral copies(<138 copies/mL). A negative result must be combined with clinical observations, patient history, and epidemiological information. The expected result is Negative.  Fact Sheet for Patients:  BloggerCourse.com  Fact Sheet for Healthcare Providers:  SeriousBroker.it  This test is no t yet approved or cleared by the [place] FDA and  has been authorized for detection and/or diagnosis of SARS-CoV-2 by FDA under an Emergency Use Authorization (EUA). This EUA will remain  in effect (meaning this test can be used) for the duration of the COVID-19 declaration under Section 564(b)(1) of the Act,  21 U.S.C.section 360bbb-3(b)(1), unless the authorization is terminated  or revoked sooner.       Influenza A by PCR POSITIVE (A) NEGATIVE Final   Influenza B by PCR NEGATIVE NEGATIVE Final    Comment: (NOTE) The Xpert Xpress SARS-CoV-2/FLU/RSV plus assay is intended as an aid in the diagnosis of influenza from Nasopharyngeal swab specimens and should not be used as a sole basis for treatment. Nasal washings and aspirates are unacceptable for Xpert Xpress SARS-CoV-2/FLU/RSV testing.  Fact Sheet for Patients: BloggerCourse.com  Fact Sheet for Healthcare Providers: SeriousBroker.it  This test  is not yet approved or cleared by the [place] and has been authorized for detection and/or diagnosis of SARS-CoV-2 by FDA under an Emergency Use Authorization (EUA). This EUA will remain in effect (meaning this test can be used) for the duration of the COVID-19 declaration under Section 564(b)(1) of the Act, 21 U.S.C. section 360bbb-3(b)(1), unless the authorization is terminated or revoked.     Resp Syncytial Virus by PCR NEGATIVE NEGATIVE Final    Comment: (NOTE) Fact Sheet for Patients: BloggerCourse.com  Fact Sheet for Healthcare Providers: SeriousBroker.it  This test is not yet approved or cleared by the [place] FDA and has been authorized for detection and/or diagnosis of SARS-CoV-2 by FDA under an Emergency Use Authorization (EUA). This EUA will remain in effect (meaning this test can be used) for the duration of the COVID-19 declaration under Section 564(b)(1) of the Act, 21 U.S.C. section 360bbb-3(b)(1), unless the authorization is terminated or revoked.  Performed at [facility], [address] [place], [place] [address]      Scheduled Meds:  arformoterol  15 mcg Nebulization BID   aspirin EC  81 mg Oral Daily   azithromycin  500 mg Oral Daily    budesonide (PULMICORT) nebulizer solution  0.5 mg Nebulization BID   enoxaparin (LOVENOX) injection  30 mg Subcutaneous Q24H   feeding supplement  237 mL Oral BID BM   guaiFENesin  600 mg Oral BID   ipratropium-albuterol  3 mL Nebulization Q6H WA   metoprolol tartrate  25 mg Oral BID   oseltamivir  30 mg Oral BID   rosuvastatin  10 mg Oral Daily   Continuous Infusions:  cefTRIAXone (ROCEPHIN)  IV 2 g (06/22/23 1650)    Procedures/Studies: ECHOCARDIOGRAM COMPLETE Result Date: 06/20/2023    ECHOCARDIOGRAM REPORT   Patient Name:   [name] Date of Exam: 06/20/2023 Medical Rec #:  [id number]   Height:       63.0 in Accession #:    [id number]  Weight:       148.0 lb Date of Birth:  14-Oct-1935   BSA:          1.701 m Patient Age:    87 years    BP:           127/57 mmHg Patient Gender: F           HR:           103 bpm. [facility] Procedure: 2D Echo, Cardiac Doppler and Color Doppler (Both Spectral and Color            Flow Doppler were utilized during procedure). Indications:    I50.40* Unspecified combined systolic (congestive) and diastolic                 (congestive) heart failure  History:        Patient has prior history of Echocardiogram examinations, most                 recent 12/24/2021. Abnormal ECG, Aortic Valve Disease;                 Signs/Symptoms:Shortness of Breath and Dyspnea. FLU positive.                 Aortic stenosis. TAVR. Pulmonary edema.                 Aortic Valve: 20 mm Edwards Sapien prosthetic, stented (TAVR)  valve is present in the aortic position. Procedure Date:                 10/19/2017.  Sonographer:    Sheralyn Boatman RDCS Referring Phys: [phone] JACOB J STINSON  Sonographer Comments: Technically difficult study due to poor echo windows, suboptimal parasternal window and suboptimal apical window. IMPRESSIONS  1. Left ventricular ejection fraction, by estimation, is >75%. The left ventricle has hyperdynamic function. The left ventricle has no  regional wall motion abnormalities. Left ventricular diastolic parameters are indeterminate.  2. Right ventricular systolic function is normal. The right ventricular size is normal. Tricuspid regurgitation signal is inadequate for assessing PA pressure.  3. The mitral valve is abnormal. No evidence of mitral valve regurgitation. Moderate mitral stenosis. The mean mitral valve gradient is 6.0 mmHg. Moderate mitral annular calcification.  4. The aortic valve was not well visualized. Aortic valve regurgitation is not visualized. No aortic stenosis is present. There is a 20 mm Edwards Sapien prosthetic (TAVR) valve present in the aortic position. Procedure Date: 10/19/2017. Echo findings are  consistent with perivalvular leak of the aortic prosthesis.  5. The inferior vena cava is normal in size with greater than 50% respiratory variability, suggesting right atrial pressure of 3 mmHg. FINDINGS  Left Ventricle: Left ventricular ejection fraction, by estimation, is >75%. The left ventricle has hyperdynamic function. The left ventricle has no regional wall motion abnormalities. The left ventricular internal cavity size was normal in size. There is no left ventricular hypertrophy. Left ventricular diastolic parameters are indeterminate. Right Ventricle: The right ventricular size is normal. Right vetricular wall thickness was not well visualized. Right ventricular systolic function is normal. Tricuspid regurgitation signal is inadequate for assessing PA pressure. Left Atrium: Left atrial size was normal in size. Right Atrium: Right atrial size was normal in size. Pericardium: There is no evidence of pericardial effusion. Mitral Valve: The mitral valve is abnormal. There is moderate thickening of the mitral valve leaflet(s). There is moderate calcification of the mitral valve leaflet(s). Moderate mitral annular calcification. No evidence of mitral valve regurgitation. Moderate mitral valve stenosis. MV peak gradient, 14.1  mmHg. The mean mitral valve gradient is 6.0 mmHg. Tricuspid Valve: The tricuspid valve is normal in structure. Tricuspid valve regurgitation is not demonstrated. No evidence of tricuspid stenosis. Aortic Valve: The aortic valve was not well visualized. Aortic valve regurgitation is not visualized. No aortic stenosis is present. Aortic valve mean gradient measures 9.6 mmHg. Aortic valve peak gradient measures 18.0 mmHg. Aortic valve area, by VTI measures 1.66 cm. There is a 20 mm Edwards Sapien prosthetic, stented (TAVR) valve present in the aortic position. Procedure Date: 10/19/2017. Pulmonic Valve: The pulmonic valve was not well visualized. Pulmonic valve regurgitation is not visualized. No evidence of pulmonic stenosis. Aorta: The aortic root is normal in size and structure and the ascending aorta was not well visualized. Venous: The inferior vena cava is normal in size with greater than 50% respiratory variability, suggesting right atrial pressure of 3 mmHg. IAS/Shunts: The interatrial septum was not well visualized.  LEFT VENTRICLE PLAX 2D LVIDd:         4.60 cm     Diastology LVIDs:         2.40 cm     LV e' medial:    8.05 cm/s LV PW:         1.00 cm     LV E/e' medial:  8.2 LV IVS:        0.90 cm  LV e' lateral:   4.03 cm/s LVOT diam:     2.00 cm     LV E/e' lateral: 16.3 LV SV:         55 LV SV Index:   32 LVOT Area:     3.14 cm  LV Volumes (MOD) LV vol d, MOD A2C: 35.0 ml LV vol d, MOD A4C: 56.9 ml LV vol s, MOD A2C: 19.7 ml LV vol s, MOD A4C: 17.7 ml LV SV MOD A2C:     15.3 ml LV SV MOD A4C:     56.9 ml LV SV MOD BP:      27.2 ml RIGHT VENTRICLE             IVC RV S prime:     12.80 cm/s  IVC diam: 1.40 cm TAPSE (M-mode): 1.1 cm LEFT ATRIUM             Index        RIGHT ATRIUM          Index LA diam:        3.70 cm 2.17 cm/m   RA Area:     8.11 cm LA Vol (A2C):   23.2 ml 13.64 ml/m  RA Volume:   11.80 ml 6.94 ml/m LA Vol (A4C):   22.4 ml 13.17 ml/m LA Biplane Vol: 22.9 ml 13.46 ml/m  AORTIC  VALVE AV Area (Vmax):    1.85 cm AV Area (Vmean):   1.81 cm AV Area (VTI):     1.66 cm AV Vmax:           211.92 cm/s AV Vmean:          139.841 cm/s AV VTI:            0.332 m AV Peak Grad:      18.0 mmHg AV Mean Grad:      9.6 mmHg LVOT Vmax:         125.00 cm/s LVOT Vmean:        80.625 cm/s LVOT VTI:          0.176 m LVOT/AV VTI ratio: 0.53  AORTA Ao Root diam: 2.60 cm MITRAL VALVE MV Area (PHT): 4.68 cm     SHUNTS MV Area VTI:   2.36 cm     Systemic VTI:  0.18 m MV Peak grad:  14.1 mmHg    Systemic Diam: 2.00 cm MV Mean grad:  6.0 mmHg MV Vmax:       1.88 m/s MV Vmean:      106.0 cm/s MV Decel Time: 162 msec MV E velocity: 65.80 cm/s MV A velocity: 147.00 cm/s MV E/A ratio:  0.45 Dina Rich MD Electronically signed by Dina Rich MD Signature Date/Time: 06/20/2023/5:46:01 PM    Final    CT CHEST WO CONTRAST Result Date: 06/20/2023 CLINICAL DATA:  Pneumonia, complication suspected, xray done Respiratory illness, nondiagnostic xray EXAM: CT CHEST WITHOUT CONTRAST TECHNIQUE: Multidetector CT imaging of the chest was performed following the standard protocol without IV contrast. RADIATION DOSE REDUCTION: This exam was performed according to the departmental dose-optimization program which includes automated exposure control, adjustment of the mA and/or kV according to patient size and/or use of iterative reconstruction technique. COMPARISON:  Chest radiograph from one day prior. 02/26/2023 chest CT FINDINGS: Cardiovascular: Normal heart size. No significant pericardial effusion/thickening. TAVR in place. Three-vessel coronary atherosclerosis. Atherosclerotic nonaneurysmal thoracic aorta. Normal caliber pulmonary arteries. Mediastinum/Nodes: No significant thyroid nodules. Unremarkable esophagus. No pathologically enlarged axillary, mediastinal or hilar  lymph nodes, noting limited sensitivity for the detection of hilar adenopathy on this noncontrast study. Lungs/Pleura: No pneumothorax. No pleural  effusion. Diffuse bronchial wall thickening. Extensive patchy centrilobular nodularity and tree-in-bud opacity throughout both lungs with scattered mild-to-moderate regions of patchy consolidation in the lower lobes bilaterally, compatible with multilobar bronchopneumonia. No discrete lung masses. No bronchiectasis. Upper abdomen: Nonobstructing 2 mm upper left renal stone. Exophytic 1.3 cm posterior upper right renal cyst with layering calcification, stable size, compatible with a Bosniak category 2 renal cyst for which no follow-up imaging is recommended. Musculoskeletal: No aggressive appearing focal osseous lesions. Moderate thoracic spondylosis. IMPRESSION: 1. Multilobar bronchopneumonia, most severe in the lower lobes. 2. Three-vessel coronary atherosclerosis. 3. Nonobstructing left nephrolithiasis. Electronically Signed   By: Delbert Phenix M.D.   On: 06/20/2023 10:21   DG Chest Port 1 View Result Date: 06/19/2023 CLINICAL DATA:  Dyspnea for 1 week EXAM: PORTABLE CHEST 1 VIEW COMPARISON:  11/23/2022 chest radiograph. FINDINGS: TAVR in place. Partially visualized surgical hardware from ACDF. Stable cardiomediastinal silhouette with normal heart size. No pneumothorax. No pleural effusion. Generalized mild prominence of the parahilar interstitial markings. No consolidative airspace disease. IMPRESSION: Generalized mild prominence of the parahilar interstitial markings, which could represent mild pulmonary edema or atypical/viral infection. Electronically Signed   By: Delbert Phenix M.D.   On: 06/19/2023 14:26    Catarina Hartshorn, DO  [facility]  If 7PM-7AM, please contact night-coverage [email or web address] Password [email or web address] 06/22/2023, 5:42 PM   LOS: 3 days

## 2023-06-22 NOTE — Progress Notes (Signed)
 Mobility Specialist Progress Note:    06/22/23 1602  Mobility  Activity Ambulated with assistance in room;Ambulated with assistance to bathroom;Stood at bedside  Level of Assistance Standby assist, set-up cues, supervision of patient - no hands on  Assistive Device None;Front wheel walker  Distance Ambulated (ft) 60 ft  Range of Motion/Exercises Active;All extremities  Activity Response Tolerated well  Mobility Referral Yes  Mobility visit 1 Mobility  Mobility Specialist Start Time (ACUTE ONLY) 1535  Mobility Specialist Stop Time (ACUTE ONLY) 1600  Mobility Specialist Time Calculation (min) (ACUTE ONLY) 25 min   Pt received in bed, agreeable to mobility. Required SBA to stand and ambulate with RW. Pt can ambulate w/o AD. Tolerated well, SpO2 99% on 2L throughout. Placed pt on RA per nurse, SpO2 94% on RA at rest. Deferred ambulation on RA d/t fatigue. Returned pt supine, alarm on. All needs met.   Lawerance Bach Mobility Specialist Please contact via Special educational needs teacher or  Rehab office at [phone]

## 2023-06-23 DIAGNOSIS — J9601 Acute respiratory failure with hypoxia: Secondary | ICD-10-CM | POA: Diagnosis not present

## 2023-06-23 LAB — COMPREHENSIVE METABOLIC PANEL
ALT: 30 U/L (ref 0–44)
AST: 31 U/L (ref 15–41)
Albumin: 2.6 g/dL — ABNORMAL LOW (ref 3.5–5.0)
Alkaline Phosphatase: 52 U/L (ref 38–126)
Anion gap: 8 (ref 5–15)
BUN: 25 mg/dL — ABNORMAL HIGH (ref 8–23)
CO2: 25 mmol/L (ref 22–32)
Calcium: 8.7 mg/dL — ABNORMAL LOW (ref 8.9–10.3)
Chloride: 97 mmol/L — ABNORMAL LOW (ref 98–111)
Creatinine, Ser: 0.89 mg/dL (ref 0.44–1.00)
GFR, Estimated: >60 mL/min (ref >60)
Glucose, Bld: 97 mg/dL (ref 70–99)
Potassium: 4.9 mmol/L (ref 3.5–5.1)
Sodium: 130 mmol/L — ABNORMAL LOW (ref 135–145)
Total Bilirubin: 0.5 mg/dL (ref 0.0–1.2)
Total Protein: 6.3 g/dL — ABNORMAL LOW (ref 6.5–8.1)

## 2023-06-23 MED ORDER — IPRATROPIUM-ALBUTEROL 0.5-2.5 (3) MG/3ML IN SOLN: 3.0000 mL | Freq: Four times a day (QID) | RESPIRATORY_TRACT | Status: DC | PRN

## 2023-06-23 MED ORDER — SIMETHICONE 80 MG PO CHEW: 80.0000 mg | CHEWABLE_TABLET | Freq: Four times a day (QID) | ORAL | Status: DC | PRN

## 2023-06-23 NOTE — Progress Notes (Signed)
 [facility] Progress Note  Name: [name] MRN: [id number] DOB: April 29, 1935  Chief Complaint:  Fevers, shortness of breath  Subjective:  Strict ins/outs are not available.  She had two unmeasured urine voids over 3/10.  She feels a little better today - her appetite is coming back.  She's on room air this AM.  She doesn't actually take lasix daily - just PRN.  Review of systems:   She denies shortness of breath - breathing is better than it was on arrival.  Does have a cough  Denies n/v No chest pain  --------------- Background on consult: 52F presented to the ED 06/19/2023 with AHRF, fevers, headache, arthralgias/myalgias and found to have influenza.  Admitted to [facility], placed on oseltamavir.   PMH Includes: Severe AS status post TAVR 10/2017 Hypertension Hyperlipidemia GERD Remote cholecystectomy, hysterectomy   Patient appears to have fairly preserved GFR.  Admitted with serum sodium of 128 and in September 2024 had a normal value.  Serum sodium has worsened to 122 today.  See below.  Has received intermittent dosing of furosemide to treat any potential pulmonary edema.  She has been drinking her fluids thinking it would be helpful, pushing water.  Appetite is poor.  Urine studies not yet provided because of furosemide dosing.  No edema.  She is lying flat in bed on 3 L nasal cannula and breathing comfortably.  UOP does not appear to be fully quantified.  She currently has a mild headache but no nausea/vomiting.  She is oriented to self, location, year, month, day of the week.     Intake/Output Summary (Last 24 hours) at 06/23/2023 0856 Last data filed at 06/22/2023 1436 Gross per 24 hour  Intake 480 ml  Output --  Net 480 ml    Vitals:  Vitals:   06/22/23 2043 06/22/23 2148 06/23/23 0419 06/23/23 0752  BP: (!) 148/47  (!) 155/48   Pulse: 97  89   Resp: 20  17   Temp: 98.9 F (37.2 C)  98.7 F (37.1 C)   TempSrc: Oral  Oral   SpO2: 92% 93%  90% 92%  Weight:   60.8 kg   Height:         Physical Exam:   General elderly female in bed in no acute distress HEENT normocephalic atraumatic extraocular movements intact sclera anicteric Neck supple trachea midline Lungs clear but reduced on auscultation; on room air; Normal work of breathing Heart S1S2 no rub  Abdomen soft nontender nondistended Extremities no edema  Psych normal mood and affect Neuro awake and conversant; provides hx and follows commands    Medications reviewed   Labs:     Latest Ref Rng & Units 06/23/2023    4:18 AM 06/22/2023    2:07 PM 06/22/2023    4:11 AM  BMP  Glucose 70 - 99 mg/dL 97   409   BUN 8 - 23 mg/dL 25   27   Creatinine 8.11 - 1.00 mg/dL 9.14   7.82   Sodium 956 - 145 mmol/L 130  127  125   Potassium 3.5 - 5.1 mmol/L 4.9   4.9   Chloride 98 - 111 mmol/L 97   93   CO2 22 - 32 mmol/L 25   23   Calcium 8.9 - 10.3 mg/dL 8.7   8.5      Assessment/Plan:   52F with worsening euvolemic hyponatremia.   Euvolemic hyponatremia in the context of influenza, likely SIADH 1.2 liters fluid  restriction  Would defer furosemide unless overtly volume overloaded.  She clarifies home lasix is PRN  urine sodium is inappropriately elevated Would stop home her NSAID (nabumetone) Influenza infection receiving oseltamivir AHRF from influenza - on nasal cannula  Acute on chronic HFpEF off of her home lasix for now as above.  Can resume lasix on a PRN basis on discharge  Hypertension as an outpatient - reasonable control  Likely CKD 3A with creatinine between 1.0 and 1.2 at baseline  Disposition - per primary team.  Nephrology will sign off.  Would recommend follow-up with her PCP within one week of discharge.  Please do not hesitate to reach out with questions     Estanislado Emms, MD 06/23/2023 9:07 AM

## 2023-06-23 NOTE — Plan of Care (Signed)

## 2023-06-23 NOTE — Progress Notes (Signed)
 PROGRESS NOTE  [name]:[id number] DOB: 12/21/1935 DOA: 06/19/2023 PCP: Raliegh Ip, DO    Subjective: The patient was seen and examined, laying in bed, complaining of severe generalized weaknesses, unable to ambulate without assist, also complaining of persistent cough, Shortness of breath with minimal exertion    Brief History:  88 year old female with a history of severe AS s/p TAVR 10/2017, hypertension, hyperlipidemia, fibromyalgia presenting with 1 week history of subjective fevers, chills, coughing, shortness of breath, and myalgias and arthralgias.  The patient had been using over-the-counter essential oils and other homeopathic remedies without much relief.  Her coughing and shortness of breath progressed.  She denied any worsening lower extremity edema.  She went to urgent care on the morning of 06/19/2023.  Apparently the patient was noted to be hypoxic and sent to the emergency department for further evaluation and treatment. The patient denied any chest pain or anginal symptoms in the past week. Her cough has been largely nonproductive.  She denies any hemoptysis.  She has had some nausea without emesis.  She denies any abdominal pain, diarrhea, hematochezia, melena, dysuria, hematuria. She has been having diffuse arthralgias and myalgias. She complained of a frontal headache without any visual disturbance or focal extremity weakness. She states that her daughter recently had influenza.  In the ED, the patient was febrile up to 101.6 F.  She had increased work of breathing and was placed on BiPAP.  She was hemodynamically stable.  She was tachycardic 100-110.  WBC 10.0, hemoglobin 12.0, platelets 254.  Sodium 120, potassium 4.4, bicarbonate 23, serum creatinine 1.00.  BNP was 1203 so, the patient was given furosemide 40 mg IV x 2.  She was started on bronchodilators.  She was started on oseltamavir. She was weaned off of BiPAP and placed on 3 L.       Assessment/Plan: Acute respiratory failure with hypoxia -Secondary to influenza pneumonia -Much improved, O2 sat 90-to 92% on room air at rest, desats with minimal exertion  -Certainly, there may have been a degree of fluid overload -Initially on BiPAP -Currently stable on 3 L>>2L>>> to room air -VBG--7.44/40/44/27 -COVID-19 PCR negative -CT chest---diffuse bronchial wall thickening;  patchy tree-in-bud opacities bilateral; patchy consolidation bilateral LL -PCT 0.64>>0.67 -continue empiric azithro/ceftriaxone   Influenza pneumonitis/Lobar pneumonia -Continue oseltamavir -Continue bronchodilators -Lactic acid 1.5 -PCT 0.64>>0.67 >>  -continue empiric azithro/ceftriaxone   Acute on chronic HFpEF?? -Certainly, the patient may have had a degree of fluid overload -BNP 1203 -Given furosemide 40 mg IV x 2 >>> Per nephrology on hold -Home dose Lasix is confirmed as needed  -Weaned off of BiPAP after furosemide -Holding further furosemide dosing---now clinically euvolemic  -12/24/2021 echo EF 65 to 70%, no WMA, grade 1 DD, normal RVF -3/9 echo--EF >75%, no WMA, indeterminant diastology. normal RVF, moderate MS  Hyponatremia/SIADH -Hold further dosing Furosemide for now and monitor clinically -Patient had received 2 doses of IV furosemide>> defer urine studies  -Check serum osmolarity--271 -Urine osm 299 -elevated,  -appreciate nephrology>>SIADH due to acute illness -1.2L fluid restrict>>Na improving  Elevated troponin -Secondary to demand ischemia -Stable denies having any chest pain  -12/24/2021 echo EF 65 to 70%, no WMA, grade 1 DD, normal RVF, mild MR -No chest pain or anginal type symptoms -Troponin 384>> 333>> 221>> 220 -3/9 echo--EF >75%, no WMA, indeterminant diastology. normal RVF, moderate MS   Essential hypertension -Blood pressure improving, -Holding hydralazine amlodipine -Continue metoprolol  Mixed hyperlipidemia -Continue Crestor   Leukemoid  reaction -UA--bland -PCT--0.64 -lactate 1.5 -follow blood culture--neg to date   CKD 3a -baseline creatinine 0.9-1.1 -Creatinine at 0.89        Family Communication:   no Family at bedside Consultants:  renal  Code Status:  FULL  DVT Prophylaxis:    Lovenox   Procedures: As Listed in Progress Note Above   Antibiotics: Ceftriaxone 3/10>> Azithro 3/10>>     Disposition - likely home with home health in a.m.  Medically not stable for discharge yet, sodium still at 130, complaining of shortness of breath with minimal exertion-cough, congestion, pending PT evaluation   Objective: Vitals:   06/22/23 2043 06/22/23 2148 06/23/23 0419 06/23/23 0752  BP: (!) 148/47  (!) 155/48   Pulse: 97  89   Resp: 20  17   Temp: 98.9 F (37.2 C)  98.7 F (37.1 C)   TempSrc: Oral  Oral   SpO2: 92% 93% 90% 92%  Weight:   60.8 kg   Height:        Intake/Output Summary (Last 24 hours) at 06/23/2023 1126 Last data filed at 06/22/2023 1436 Gross per 24 hour  Intake 240 ml  Output --  Net 240 ml   Weight change: -3.9 kg Exam:       General:  AAO x 3,  cooperative, no distress;   HEENT:  Normocephalic, PERRL, otherwise with in Normal limits   Neuro:  CNII-XII intact. , normal motor and sensation, reflexes intact   Lungs:   Clear to auscultation BL, Respirations unlabored,  No wheezes / crackles  Cardio:    S1/S2, RRR, No murmure, No Rubs or Gallops   Abdomen:  Soft, non-tender, bowel sounds active all four quadrants, no guarding or peritoneal signs.  Muscular  skeletal:  Limited exam -global generalized weaknesses - in bed, able to move all 4 extremities,   2+ pulses,  symmetric, No pitting edema  Skin:  Dry, warm to touch, negative for any Rashes,  Wounds: Please see nursing documentation          Data Reviewed: I have personally reviewed following labs and imaging studies Basic Metabolic Panel: Recent Labs  Lab 06/19/23 1420 06/20/23 0515 06/21/23 0321  06/21/23 1803 06/22/23 0411 06/22/23 1407 06/23/23 0418  NA 128* 125* 122* 124* 125* 127* 130*  K 4.4 4.9 4.8  --  4.9  --  4.9  CL 94* 96* 90*  --  93*  --  97*  CO2 23 22 21*  --  23  --  25  GLUCOSE 109* 98 97  --  103*  --  97  BUN 21 24* 30*  --  27*  --  25*  CREATININE 1.00 1.19* 1.27*  --  1.12*  --  0.89  CALCIUM 8.7* 8.3* 8.2*  --  8.5*  --  8.7*  MG  --   --  2.3  --   --   --   --   PHOS  --   --   --   --  3.8  --   --    Liver Function Tests: Recent Labs  Lab 06/19/23 1420 06/23/23 0418  AST 48* 31  ALT 32 30  ALKPHOS 49 52  BILITOT 0.7 0.5  PROT 7.0 6.3*  ALBUMIN 3.1* 2.6*    Recent Labs  Lab 06/19/23 1420 06/20/23 0515 06/21/23 0321 06/22/23 0411  WBC 10.2 16.2* 15.0* 14.5*  NEUTROABS 7.5  --  10.5*  --  HGB 12.0 11.9* 10.5* 10.7*  HCT 35.7* 35.6* 31.5* 32.1*  MCV 89.7 89.2 89.7 88.9  PLT 244 254 262 297    Urine analysis:    Component Value Date/Time   COLORURINE YELLOW 06/20/2023 0856   APPEARANCEUR CLEAR 06/20/2023 0856   APPEARANCEUR Cloudy (A) 09/22/2021 1542   LABSPEC 1.008 06/20/2023 0856   PHURINE 5.0 06/20/2023 0856   GLUCOSEU NEGATIVE 06/20/2023 0856   HGBUR MODERATE (A) 06/20/2023 0856   BILIRUBINUR NEGATIVE 06/20/2023 0856   BILIRUBINUR Negative 09/22/2021 1542   KETONESUR NEGATIVE 06/20/2023 0856   PROTEINUR NEGATIVE 06/20/2023 0856   UROBILINOGEN negative 06/03/2015 1650   UROBILINOGEN 1.0 05/29/2013 1845   NITRITE NEGATIVE 06/20/2023 0856   LEUKOCYTESUR NEGATIVE 06/20/2023 0856   Sepsis Labs: @LABRCNTIP (procalcitonin:4,lacticidven:4) ) Recent Results (from the past 240 hours)  Culture, blood (routine x 2)     Status: None (Preliminary result)   Collection Time: 06/19/23  2:20 PM   Specimen: BLOOD  Result Value Ref Range Status   Specimen Description BLOOD RIGHT ANTECUBITAL  Final   Special Requests   Final    BOTTLES DRAWN AEROBIC AND ANAEROBIC Blood Culture adequate volume   Culture   Final    NO GROWTH 4  DAYS Performed at [facility], [address], [place], [place] [address]    Report Status PENDING  Incomplete  Culture, blood (routine x 2)     Status: None (Preliminary result)   Collection Time: 06/19/23  2:20 PM   Specimen: BLOOD  Result Value Ref Range Status   Specimen Description BLOOD LEFT ANTECUBITAL  Final   Special Requests   Final    BOTTLES DRAWN AEROBIC AND ANAEROBIC Blood Culture adequate volume   Culture   Final    NO GROWTH 4 DAYS Performed at [facility], [address] [place], [place] [address]    Report Status PENDING  Incomplete  Resp panel by RT-PCR (RSV, Flu A&B, Covid) Anterior Nasal Swab     Status: Abnormal   Collection Time: 06/19/23  2:31 PM   Specimen: Anterior Nasal Swab  Result Value Ref Range Status   SARS Coronavirus 2 by RT PCR NEGATIVE NEGATIVE Final    Comment: (NOTE) SARS-CoV-2 target nucleic acids are NOT DETECTED.  The SARS-CoV-2 RNA is generally detectable in upper respiratory specimens during the acute phase of infection. The lowest concentration of SARS-CoV-2 viral copies this assay can detect is 138 copies/mL. A negative result does not preclude SARS-Cov-2 infection and should not be used as the sole basis for treatment or other patient management decisions. A negative result may occur with  improper specimen collection/handling, submission of specimen other than nasopharyngeal swab, presence of viral mutation(s) within the areas targeted by this assay, and inadequate number of viral copies(<138 copies/mL). A negative result must be combined with clinical observations, patient history, and epidemiological information. The expected result is Negative.  Fact Sheet for Patients:  BloggerCourse.com  Fact Sheet for Healthcare Providers:  SeriousBroker.it  This test is no t yet approved or cleared by the [place] FDA and  has been authorized for detection and/or diagnosis of  SARS-CoV-2 by FDA under an Emergency Use Authorization (EUA). This EUA will remain  in effect (meaning this test can be used) for the duration of the COVID-19 declaration under Section 564(b)(1) of the Act, 21 U.S.C.section 360bbb-3(b)(1), unless the authorization is terminated  or revoked sooner.       Influenza A by PCR POSITIVE (A) NEGATIVE Final   Influenza B  by PCR NEGATIVE NEGATIVE Final    Comment: (NOTE) The Xpert Xpress SARS-CoV-2/FLU/RSV plus assay is intended as an aid in the diagnosis of influenza from Nasopharyngeal swab specimens and should not be used as a sole basis for treatment. Nasal washings and aspirates are unacceptable for Xpert Xpress SARS-CoV-2/FLU/RSV testing.  Fact Sheet for Patients: BloggerCourse.com  Fact Sheet for Healthcare Providers: SeriousBroker.it  This test is not yet approved or cleared by the [place] FDA and has been authorized for detection and/or diagnosis of SARS-CoV-2 by FDA under an Emergency Use Authorization (EUA). This EUA will remain in effect (meaning this test can be used) for the duration of the COVID-19 declaration under Section 564(b)(1) of the Act, 21 U.S.C. section 360bbb-3(b)(1), unless the authorization is terminated or revoked.     Resp Syncytial Virus by PCR NEGATIVE NEGATIVE Final    Comment: (NOTE) Fact Sheet for Patients: BloggerCourse.com  Fact Sheet for Healthcare Providers: SeriousBroker.it  This test is not yet approved or cleared by the [place] FDA and has been authorized for detection and/or diagnosis of SARS-CoV-2 by FDA under an Emergency Use Authorization (EUA). This EUA will remain in effect (meaning this test can be used) for the duration of the COVID-19 declaration under Section 564(b)(1) of the Act, 21 U.S.C. section 360bbb-3(b)(1), unless the authorization is terminated  or revoked.  Performed at [facility], [address] [place], [place] [address]      Scheduled Meds:  arformoterol  15 mcg Nebulization BID   aspirin EC  81 mg Oral Daily   azithromycin  500 mg Oral Daily   budesonide (PULMICORT) nebulizer solution  0.5 mg Nebulization BID   enoxaparin (LOVENOX) injection  30 mg Subcutaneous Q24H   feeding supplement  237 mL Oral BID BM   guaiFENesin  600 mg Oral BID   ipratropium-albuterol  3 mL Nebulization Q6H WA   metoprolol tartrate  25 mg Oral BID   oseltamivir  30 mg Oral BID   rosuvastatin  10 mg Oral Daily   Continuous Infusions:  cefTRIAXone (ROCEPHIN)  IV 2 g (06/22/23 1650)    Procedures/Studies: ECHOCARDIOGRAM COMPLETE Result Date: 06/20/2023    ECHOCARDIOGRAM REPORT   Patient Name:   TIFFAY PINETTE Date of Exam: 06/20/2023 Medical Rec #:  [id number]   Height:       63.0 in Accession #:    [id number]  Weight:       148.0 lb Date of Birth:  09/02/1935   BSA:          1.701 m Patient Age:    87 years    BP:           127/57 mmHg Patient Gender: F           HR:           103 bpm. [facility] Procedure: 2D Echo, Cardiac Doppler and Color Doppler (Both Spectral and Color            Flow Doppler were utilized during procedure). Indications:    I50.40* Unspecified combined systolic (congestive) and diastolic                 (congestive) heart failure  History:        Patient has prior history of Echocardiogram examinations, most                 recent 12/24/2021. Abnormal ECG, Aortic Valve Disease;  Signs/Symptoms:Shortness of Breath and Dyspnea. FLU positive.                 Aortic stenosis. TAVR. Pulmonary edema.                 Aortic Valve: 20 mm Edwards Sapien prosthetic, stented (TAVR)                 valve is present in the aortic position. Procedure Date:                 10/19/2017.  Sonographer:    Sheralyn Boatman RDCS Referring Phys: [phone] JACOB J STINSON  Sonographer Comments: Technically difficult study due to poor echo  windows, suboptimal parasternal window and suboptimal apical window. IMPRESSIONS  1. Left ventricular ejection fraction, by estimation, is >75%. The left ventricle has hyperdynamic function. The left ventricle has no regional wall motion abnormalities. Left ventricular diastolic parameters are indeterminate.  2. Right ventricular systolic function is normal. The right ventricular size is normal. Tricuspid regurgitation signal is inadequate for assessing PA pressure.  3. The mitral valve is abnormal. No evidence of mitral valve regurgitation. Moderate mitral stenosis. The mean mitral valve gradient is 6.0 mmHg. Moderate mitral annular calcification.  4. The aortic valve was not well visualized. Aortic valve regurgitation is not visualized. No aortic stenosis is present. There is a 20 mm Edwards Sapien prosthetic (TAVR) valve present in the aortic position. Procedure Date: 10/19/2017. Echo findings are  consistent with perivalvular leak of the aortic prosthesis.  5. The inferior vena cava is normal in size with greater than 50% respiratory variability, suggesting right atrial pressure of 3 mmHg. FINDINGS  Left Ventricle: Left ventricular ejection fraction, by estimation, is >75%. The left ventricle has hyperdynamic function. The left ventricle has no regional wall motion abnormalities. The left ventricular internal cavity size was normal in size. There is no left ventricular hypertrophy. Left ventricular diastolic parameters are indeterminate. Right Ventricle: The right ventricular size is normal. Right vetricular wall thickness was not well visualized. Right ventricular systolic function is normal. Tricuspid regurgitation signal is inadequate for assessing PA pressure. Left Atrium: Left atrial size was normal in size. Right Atrium: Right atrial size was normal in size. Pericardium: There is no evidence of pericardial effusion. Mitral Valve: The mitral valve is abnormal. There is moderate thickening of the mitral valve  leaflet(s). There is moderate calcification of the mitral valve leaflet(s). Moderate mitral annular calcification. No evidence of mitral valve regurgitation. Moderate mitral valve stenosis. MV peak gradient, 14.1 mmHg. The mean mitral valve gradient is 6.0 mmHg. Tricuspid Valve: The tricuspid valve is normal in structure. Tricuspid valve regurgitation is not demonstrated. No evidence of tricuspid stenosis. Aortic Valve: The aortic valve was not well visualized. Aortic valve regurgitation is not visualized. No aortic stenosis is present. Aortic valve mean gradient measures 9.6 mmHg. Aortic valve peak gradient measures 18.0 mmHg. Aortic valve area, by VTI measures 1.66 cm. There is a 20 mm Edwards Sapien prosthetic, stented (TAVR) valve present in the aortic position. Procedure Date: 10/19/2017. Pulmonic Valve: The pulmonic valve was not well visualized. Pulmonic valve regurgitation is not visualized. No evidence of pulmonic stenosis. Aorta: The aortic root is normal in size and structure and the ascending aorta was not well visualized. Venous: The inferior vena cava is normal in size with greater than 50% respiratory variability, suggesting right atrial pressure of 3 mmHg. IAS/Shunts: The interatrial septum was not well visualized.  LEFT VENTRICLE PLAX 2D LVIDd:  4.60 cm     Diastology LVIDs:         2.40 cm     LV e' medial:    8.05 cm/s LV PW:         1.00 cm     LV E/e' medial:  8.2 LV IVS:        0.90 cm     LV e' lateral:   4.03 cm/s LVOT diam:     2.00 cm     LV E/e' lateral: 16.3 LV SV:         55 LV SV Index:   32 LVOT Area:     3.14 cm  LV Volumes (MOD) LV vol d, MOD A2C: 35.0 ml LV vol d, MOD A4C: 56.9 ml LV vol s, MOD A2C: 19.7 ml LV vol s, MOD A4C: 17.7 ml LV SV MOD A2C:     15.3 ml LV SV MOD A4C:     56.9 ml LV SV MOD BP:      27.2 ml RIGHT VENTRICLE             IVC RV S prime:     12.80 cm/s  IVC diam: 1.40 cm TAPSE (M-mode): 1.1 cm LEFT ATRIUM             Index        RIGHT ATRIUM          Index  LA diam:        3.70 cm 2.17 cm/m   RA Area:     8.11 cm LA Vol (A2C):   23.2 ml 13.64 ml/m  RA Volume:   11.80 ml 6.94 ml/m LA Vol (A4C):   22.4 ml 13.17 ml/m LA Biplane Vol: 22.9 ml 13.46 ml/m  AORTIC VALVE AV Area (Vmax):    1.85 cm AV Area (Vmean):   1.81 cm AV Area (VTI):     1.66 cm AV Vmax:           211.92 cm/s AV Vmean:          139.841 cm/s AV VTI:            0.332 m AV Peak Grad:      18.0 mmHg AV Mean Grad:      9.6 mmHg LVOT Vmax:         125.00 cm/s LVOT Vmean:        80.625 cm/s LVOT VTI:          0.176 m LVOT/AV VTI ratio: 0.53  AORTA Ao Root diam: 2.60 cm MITRAL VALVE MV Area (PHT): 4.68 cm     SHUNTS MV Area VTI:   2.36 cm     Systemic VTI:  0.18 m MV Peak grad:  14.1 mmHg    Systemic Diam: 2.00 cm MV Mean grad:  6.0 mmHg MV Vmax:       1.88 m/s MV Vmean:      106.0 cm/s MV Decel Time: 162 msec MV E velocity: 65.80 cm/s MV A velocity: 147.00 cm/s MV E/A ratio:  0.45 Dina Rich MD Electronically signed by Dina Rich MD Signature Date/Time: 06/20/2023/5:46:01 PM    Final    CT CHEST WO CONTRAST Result Date: 06/20/2023 CLINICAL DATA:  Pneumonia, complication suspected, xray done Respiratory illness, nondiagnostic xray EXAM: CT CHEST WITHOUT CONTRAST TECHNIQUE: Multidetector CT imaging of the chest was performed following the standard protocol without IV contrast. RADIATION DOSE REDUCTION: This exam was performed according to the departmental dose-optimization program which includes automated  exposure control, adjustment of the mA and/or kV according to patient size and/or use of iterative reconstruction technique. COMPARISON:  Chest radiograph from one day prior. 02/26/2023 chest CT FINDINGS: Cardiovascular: Normal heart size. No significant pericardial effusion/thickening. TAVR in place. Three-vessel coronary atherosclerosis. Atherosclerotic nonaneurysmal thoracic aorta. Normal caliber pulmonary arteries. Mediastinum/Nodes: No significant thyroid nodules. Unremarkable esophagus.  No pathologically enlarged axillary, mediastinal or hilar lymph nodes, noting limited sensitivity for the detection of hilar adenopathy on this noncontrast study. Lungs/Pleura: No pneumothorax. No pleural effusion. Diffuse bronchial wall thickening. Extensive patchy centrilobular nodularity and tree-in-bud opacity throughout both lungs with scattered mild-to-moderate regions of patchy consolidation in the lower lobes bilaterally, compatible with multilobar bronchopneumonia. No discrete lung masses. No bronchiectasis. Upper abdomen: Nonobstructing 2 mm upper left renal stone. Exophytic 1.3 cm posterior upper right renal cyst with layering calcification, stable size, compatible with a Bosniak category 2 renal cyst for which no follow-up imaging is recommended. Musculoskeletal: No aggressive appearing focal osseous lesions. Moderate thoracic spondylosis. IMPRESSION: 1. Multilobar bronchopneumonia, most severe in the lower lobes. 2. Three-vessel coronary atherosclerosis. 3. Nonobstructing left nephrolithiasis. Electronically Signed   By: Delbert Phenix M.D.   On: 06/20/2023 10:21   DG Chest Port 1 View Result Date: 06/19/2023 CLINICAL DATA:  Dyspnea for 1 week EXAM: PORTABLE CHEST 1 VIEW COMPARISON:  11/23/2022 chest radiograph. FINDINGS: TAVR in place. Partially visualized surgical hardware from ACDF. Stable cardiomediastinal silhouette with normal heart size. No pneumothorax. No pleural effusion. Generalized mild prominence of the parahilar interstitial markings. No consolidative airspace disease. IMPRESSION: Generalized mild prominence of the parahilar interstitial markings, which could represent mild pulmonary edema or atypical/viral infection. Electronically Signed   By: Delbert Phenix M.D.   On: 06/19/2023 14:26    Kendell Bane,  MD Total time spent 55 minutes seeing evaluating patient, reviewing all medical records, current medication, labs,   [facility] If 7PM-7AM, please contact  night-coverage [email or web address] Password [email or web address] 06/23/2023, 11:26 AM   LOS: 4 days

## 2023-06-23 NOTE — Progress Notes (Signed)
 Mobility Specialist Progress Note:    06/23/23 1130  Mobility  Activity Transferred from bed to chair  Level of Assistance Standby assist, set-up cues, supervision of patient - no hands on  Assistive Device None  Distance Ambulated (ft) 5 ft  Range of Motion/Exercises Active;All extremities  Activity Response Tolerated well  Mobility Referral Yes  Mobility visit 1 Mobility  Mobility Specialist Start Time (ACUTE ONLY) 1115  Mobility Specialist Stop Time (ACUTE ONLY) 1130  Mobility Specialist Time Calculation (min) (ACUTE ONLY) 15 min   Pt received in bed, agreeable to mobility. Required SBA to stand and ambulate with no AD. Tolerated well, SpO2 93% on RA. Pt c/o weakness and fatigue. Left pt in chair, nurse at bedside. Alarm on, call bell in reach. All needs met.   Lawerance Bach Mobility Specialist Please contact via Special educational needs teacher or  Rehab office at [phone]

## 2023-06-24 DIAGNOSIS — J9601 Acute respiratory failure with hypoxia: Secondary | ICD-10-CM | POA: Diagnosis not present

## 2023-06-24 LAB — CULTURE, BLOOD (ROUTINE X 2)
Special Requests: ADEQUATE
Special Requests: ADEQUATE

## 2023-06-24 MED ORDER — BENZONATATE 100 MG PO CAPS
100.0000 mg | ORAL_CAPSULE | Freq: Three times a day (TID) | ORAL | 0 refills | Status: DC | PRN
Start: 2023-06-24 — End: 2023-07-01

## 2023-06-24 MED ORDER — HYDROCOD POLI-CHLORPHE POLI ER 10-8 MG/5ML PO SUER
5.0000 mL | Freq: Two times a day (BID) | ORAL | Status: DC | PRN
  Administered 2023-06-24: 5 mL via ORAL
  Filled 2023-06-24: qty 5

## 2023-06-24 MED ORDER — OSELTAMIVIR PHOSPHATE 30 MG PO CAPS: 30.0000 mg | ORAL_CAPSULE | Freq: Two times a day (BID) | ORAL | 0 refills | Status: AC

## 2023-06-24 MED ORDER — ALBUTEROL SULFATE HFA 108 (90 BASE) MCG/ACT IN AERS: 2.0000 | INHALATION_SPRAY | Freq: Four times a day (QID) | RESPIRATORY_TRACT | 2 refills | Status: AC | PRN

## 2023-06-24 MED ORDER — GUAIFENESIN-DM 100-10 MG/5ML PO SYRP: 5.0000 mL | ORAL_SOLUTION | ORAL | 0 refills | Status: DC | PRN

## 2023-06-24 MED ORDER — METOPROLOL TARTRATE 25 MG PO TABS: 25.0000 mg | ORAL_TABLET | Freq: Two times a day (BID) | ORAL | 1 refills | Status: DC

## 2023-06-24 NOTE — Discharge Summary (Signed)
 Physician Discharge Summary   Patient: [name] MRN: [id number] DOB: October 13, 1935  Admit date:     06/19/2023  Discharge date: 06/24/23  Discharge Physician: Kendell Bane   PCP: Raliegh Ip, DO   Recommendations at discharge:   Follow with the PCP in 2-4 weeks Current blood pressure medication has been discontinued due to low blood pressure and take Lasix as needed No salt restriction BMP in 2 weeks, results to PCP  Discharge Diagnoses: Principal Problem:   Acute respiratory failure with hypoxia (HCC) Active Problems:   Essential hypertension   GERD   Mixed hyperlipidemia   S/P TAVR (transcatheter aortic valve replacement)   Influenza A with pneumonia   Hyponatremia   Pulmonary edema   Elevated troponin  Resolved Problems:   * No resolved hospital problems. [facility]: 88 year old female with a history of severe AS s/p TAVR 10/2017, hypertension, hyperlipidemia, fibromyalgia presenting with 1 week history of subjective fevers, chills, coughing, shortness of breath, and myalgias and arthralgias.  The patient had been using over-the-counter essential oils and other homeopathic remedies without much relief.  Her coughing and shortness of breath progressed.  She denied any worsening lower extremity edema.  She went to urgent care on the morning of 06/19/2023.  Apparently the patient was noted to be hypoxic and sent to the emergency department for further evaluation and treatment. The patient denied any chest pain or anginal symptoms in the past week. Her cough has been largely nonproductive.  She denies any hemoptysis.  She has had some nausea without emesis.  She denies any abdominal pain, diarrhea, hematochezia, melena, dysuria, hematuria. She has been having diffuse arthralgias and myalgias. She complained of a frontal headache without any visual disturbance or focal extremity weakness. She states that her daughter recently had influenza.  In the ED, the  patient was febrile up to 101.6 F.  She had increased work of breathing and was placed on BiPAP.  She was hemodynamically stable.  She was tachycardic 100-110.  WBC 10.0, hemoglobin 12.0, platelets 254.  Sodium 120, potassium 4.4, bicarbonate 23, serum creatinine 1.00.  BNP was 1203 so, the patient was given furosemide 40 mg IV x 2.  She was started on bronchodilators.  She was started on oseltamavir. She was weaned off of BiPAP and placed on 3 L.   Acute respiratory failure with hypoxia -Resolved -stable  on room air -Secondary to influenza pneumonia -Much improved, O2 sat 90-to 92% on room air at rest, desats with minimal exertion   -Certainly, there may have been a degree of fluid overload -Initially on BiPAP -Currently stable on 3 L>>2L>>> to room air -VBG--7.44/40/44/27 -COVID-19 PCR negative -CT chest---diffuse bronchial wall thickening;  patchy tree-in-bud opacities bilateral; patchy consolidation bilateral LL -PCT 0.64>>0.67 -continue empiric azithro/ceftriaxone completed 5-day course   Influenza pneumonitis/Lobar pneumonia -Continue oseltamavir -Continue bronchodilators -Lactic acid 1.5 -PCT 0.64>>0.67 >>  -continue empiric azithro/ceftriaxone   Acute on chronic HFpEF?? -Certainly, the patient may have had a degree of fluid overload -BNP 1203 -Given furosemide 40 mg IV x 2 >>> Per nephrology on hold -Home dose Lasix is confirmed as needed   -Weaned off of BiPAP after furosemide -Holding further furosemide dosing---now clinically euvolemic  -12/24/2021 echo EF 65 to 70%, no WMA, grade 1 DD, normal RVF -3/9 echo--EF >75%, no WMA, indeterminant diastology. normal RVF, moderate MS   Hyponatremia/SIADH -Hold further dosing Furosemide for now  -Patient had received 2 doses of IV furosemide>> defer urine studies  -  Check serum osmolarity--271 -Urine osm 299 -elevated,  -appreciate nephrology>>SIADH due to acute illness -1.2L fluid restrict>>Na    Elevated  troponin -Secondary to demand ischemia -Stable denies having any chest pain   -12/24/2021 echo EF 65 to 70%, no WMA, grade 1 DD, normal RVF, mild MR -No chest pain or anginal type symptoms -Troponin 384>> 333>> 221>> 220 -3/9 echo--EF >75%, no WMA, indeterminant diastology. normal RVF, moderate MS   Essential hypertension -Blood pressure improving, -Discontinued hydralazine amlodipine -Continue metoprolol     Mixed hyperlipidemia -Continue Crestor   Leukemoid reaction -UA--bland -PCT--0.64 -lactate 1.5 -follow blood culture--neg to date   CKD 3a -baseline creatinine 0.9-1.1 -Creatinine at 0.89    Disposition: Home Diet recommendation:  Discharge Diet Orders (From admission, onward)     Start     Ordered   06/24/23 0000  Diet - low sodium heart healthy        06/24/23 1129           Regular diet DISCHARGE MEDICATION: Allergies as of 06/24/2023       Reactions   Prolia [denosumab] Other (See Comments)   Arthralgia/myalgia/jaw pain/headache   Tape Rash, Other (See Comments)   Dermatitis rash "with extended exposure"        Medication List     STOP taking these medications    amLODipine 2.5 MG tablet Commonly known as: NORVASC   hydrALAZINE 25 MG tablet Commonly known as: APRESOLINE       TAKE these medications    acetaminophen 500 MG tablet Commonly known as: TYLENOL Take 1,000 mg by mouth daily as needed for moderate pain or headache.   albuterol 108 (90 Base) MCG/ACT inhaler Commonly known as: VENTOLIN HFA Inhale 2 puffs into the lungs every 6 (six) hours as needed for wheezing or shortness of breath.   aspirin EC 81 MG tablet Take 1 tablet (81 mg total) by mouth daily.   benzonatate 100 MG capsule Commonly known as: Tessalon Perles Take 1 capsule (100 mg total) by mouth 3 (three) times daily as needed for cough.   furosemide 20 MG tablet Commonly known as: LASIX TAKE 1 TABLET BY MOUTH EVERY DAY   guaiFENesin-dextromethorphan  100-10 MG/5ML syrup Commonly known as: ROBITUSSIN DM Take 5 mLs by mouth every 4 (four) hours as needed for cough.   Magnesium 500 MG Caps Take 1 capsule by mouth daily.   metoprolol tartrate 25 MG tablet Commonly known as: LOPRESSOR Take 1 tablet (25 mg total) by mouth 2 (two) times daily. What changed:  medication strength how much to take   nabumetone 500 MG tablet Commonly known as: RELAFEN Take 2 tablets (1,000 mg total) by mouth 2 (two) times daily. For muscle and joint pain   oseltamivir 30 MG capsule Commonly known as: TAMIFLU Take 1 capsule (30 mg total) by mouth 2 (two) times daily for 1 day.   rosuvastatin 10 MG tablet Commonly known as: CRESTOR Take 1 tablet (10 mg total) by mouth daily. Pt needs to keep upcoming appt in Jan, 2025 for additional refills   vitamin C 1000 MG tablet Take 1,000 mg by mouth daily.        Discharge Exam: Filed Weights   06/22/23 3244 06/23/23 0419 06/24/23 0505  Weight: 64.7 kg 60.8 kg 63.3 kg        General:  AAO x 3,  cooperative, no distress;   HEENT:  Normocephalic, PERRL, otherwise with in Normal limits   Neuro:  CNII-XII intact. , normal motor  and sensation, reflexes intact   Lungs:   Clear to auscultation BL, Respirations unlabored,  No wheezes / crackles  Cardio:    S1/S2, RRR, No murmure, No Rubs or Gallops   Abdomen:  Soft, non-tender, bowel sounds active all four quadrants, no guarding or peritoneal signs.  Muscular  skeletal:  Limited exam -global generalized weaknesses - in bed, able to move all 4 extremities,   2+ pulses,  symmetric, No pitting edema  Skin:  Dry, warm to touch, negative for any Rashes,  Wounds: Please see nursing documentation          Condition at discharge: good  The results of significant diagnostics from this hospitalization (including imaging, microbiology, ancillary and laboratory) are listed below for reference.   Imaging Studies: ECHOCARDIOGRAM COMPLETE Result Date:  06/20/2023    ECHOCARDIOGRAM REPORT   Patient Name:   MICHALLE RADEMAKER Date of Exam: 06/20/2023 Medical Rec #:  [id number]   Height:       63.0 in Accession #:    [id number]  Weight:       148.0 lb Date of Birth:  Nov 17, 1935   BSA:          1.701 m Patient Age:    87 years    BP:           127/57 mmHg Patient Gender: F           HR:           103 bpm. [facility] Procedure: 2D Echo, Cardiac Doppler and Color Doppler (Both Spectral and Color            Flow Doppler were utilized during procedure). Indications:    I50.40* Unspecified combined systolic (congestive) and diastolic                 (congestive) heart failure  History:        Patient has prior history of Echocardiogram examinations, most                 recent 12/24/2021. Abnormal ECG, Aortic Valve Disease;                 Signs/Symptoms:Shortness of Breath and Dyspnea. FLU positive.                 Aortic stenosis. TAVR. Pulmonary edema.                 Aortic Valve: 20 mm Edwards Sapien prosthetic, stented (TAVR)                 valve is present in the aortic position. Procedure Date:                 10/19/2017.  Sonographer:    Sheralyn Boatman RDCS Referring Phys: [phone] JACOB J STINSON  Sonographer Comments: Technically difficult study due to poor echo windows, suboptimal parasternal window and suboptimal apical window. IMPRESSIONS  1. Left ventricular ejection fraction, by estimation, is >75%. The left ventricle has hyperdynamic function. The left ventricle has no regional wall motion abnormalities. Left ventricular diastolic parameters are indeterminate.  2. Right ventricular systolic function is normal. The right ventricular size is normal. Tricuspid regurgitation signal is inadequate for assessing PA pressure.  3. The mitral valve is abnormal. No evidence of mitral valve regurgitation. Moderate mitral stenosis. The mean mitral valve gradient is 6.0 mmHg. Moderate mitral annular calcification.  4. The aortic valve was not well visualized. Aortic valve  regurgitation is  not visualized. No aortic stenosis is present. There is a 20 mm Edwards Sapien prosthetic (TAVR) valve present in the aortic position. Procedure Date: 10/19/2017. Echo findings are  consistent with perivalvular leak of the aortic prosthesis.  5. The inferior vena cava is normal in size with greater than 50% respiratory variability, suggesting right atrial pressure of 3 mmHg. FINDINGS  Left Ventricle: Left ventricular ejection fraction, by estimation, is >75%. The left ventricle has hyperdynamic function. The left ventricle has no regional wall motion abnormalities. The left ventricular internal cavity size was normal in size. There is no left ventricular hypertrophy. Left ventricular diastolic parameters are indeterminate. Right Ventricle: The right ventricular size is normal. Right vetricular wall thickness was not well visualized. Right ventricular systolic function is normal. Tricuspid regurgitation signal is inadequate for assessing PA pressure. Left Atrium: Left atrial size was normal in size. Right Atrium: Right atrial size was normal in size. Pericardium: There is no evidence of pericardial effusion. Mitral Valve: The mitral valve is abnormal. There is moderate thickening of the mitral valve leaflet(s). There is moderate calcification of the mitral valve leaflet(s). Moderate mitral annular calcification. No evidence of mitral valve regurgitation. Moderate mitral valve stenosis. MV peak gradient, 14.1 mmHg. The mean mitral valve gradient is 6.0 mmHg. Tricuspid Valve: The tricuspid valve is normal in structure. Tricuspid valve regurgitation is not demonstrated. No evidence of tricuspid stenosis. Aortic Valve: The aortic valve was not well visualized. Aortic valve regurgitation is not visualized. No aortic stenosis is present. Aortic valve mean gradient measures 9.6 mmHg. Aortic valve peak gradient measures 18.0 mmHg. Aortic valve area, by VTI measures 1.66 cm. There is a 20 mm Edwards Sapien  prosthetic, stented (TAVR) valve present in the aortic position. Procedure Date: 10/19/2017. Pulmonic Valve: The pulmonic valve was not well visualized. Pulmonic valve regurgitation is not visualized. No evidence of pulmonic stenosis. Aorta: The aortic root is normal in size and structure and the ascending aorta was not well visualized. Venous: The inferior vena cava is normal in size with greater than 50% respiratory variability, suggesting right atrial pressure of 3 mmHg. IAS/Shunts: The interatrial septum was not well visualized.  LEFT VENTRICLE PLAX 2D LVIDd:         4.60 cm     Diastology LVIDs:         2.40 cm     LV e' medial:    8.05 cm/s LV PW:         1.00 cm     LV E/e' medial:  8.2 LV IVS:        0.90 cm     LV e' lateral:   4.03 cm/s LVOT diam:     2.00 cm     LV E/e' lateral: 16.3 LV SV:         55 LV SV Index:   32 LVOT Area:     3.14 cm  LV Volumes (MOD) LV vol d, MOD A2C: 35.0 ml LV vol d, MOD A4C: 56.9 ml LV vol s, MOD A2C: 19.7 ml LV vol s, MOD A4C: 17.7 ml LV SV MOD A2C:     15.3 ml LV SV MOD A4C:     56.9 ml LV SV MOD BP:      27.2 ml RIGHT VENTRICLE             IVC RV S prime:     12.80 cm/s  IVC diam: 1.40 cm TAPSE (M-mode): 1.1 cm LEFT ATRIUM  Index        RIGHT ATRIUM          Index LA diam:        3.70 cm 2.17 cm/m   RA Area:     8.11 cm LA Vol (A2C):   23.2 ml 13.64 ml/m  RA Volume:   11.80 ml 6.94 ml/m LA Vol (A4C):   22.4 ml 13.17 ml/m LA Biplane Vol: 22.9 ml 13.46 ml/m  AORTIC VALVE AV Area (Vmax):    1.85 cm AV Area (Vmean):   1.81 cm AV Area (VTI):     1.66 cm AV Vmax:           211.92 cm/s AV Vmean:          139.841 cm/s AV VTI:            0.332 m AV Peak Grad:      18.0 mmHg AV Mean Grad:      9.6 mmHg LVOT Vmax:         125.00 cm/s LVOT Vmean:        80.625 cm/s LVOT VTI:          0.176 m LVOT/AV VTI ratio: 0.53  AORTA Ao Root diam: 2.60 cm MITRAL VALVE MV Area (PHT): 4.68 cm     SHUNTS MV Area VTI:   2.36 cm     Systemic VTI:  0.18 m MV Peak grad:  14.1 mmHg     Systemic Diam: 2.00 cm MV Mean grad:  6.0 mmHg MV Vmax:       1.88 m/s MV Vmean:      106.0 cm/s MV Decel Time: 162 msec MV E velocity: 65.80 cm/s MV A velocity: 147.00 cm/s MV E/A ratio:  0.45 Dina Rich MD Electronically signed by Dina Rich MD Signature Date/Time: 06/20/2023/5:46:01 PM    Final    CT CHEST WO CONTRAST Result Date: 06/20/2023 CLINICAL DATA:  Pneumonia, complication suspected, xray done Respiratory illness, nondiagnostic xray EXAM: CT CHEST WITHOUT CONTRAST TECHNIQUE: Multidetector CT imaging of the chest was performed following the standard protocol without IV contrast. RADIATION DOSE REDUCTION: This exam was performed according to the departmental dose-optimization program which includes automated exposure control, adjustment of the mA and/or kV according to patient size and/or use of iterative reconstruction technique. COMPARISON:  Chest radiograph from one day prior. 02/26/2023 chest CT FINDINGS: Cardiovascular: Normal heart size. No significant pericardial effusion/thickening. TAVR in place. Three-vessel coronary atherosclerosis. Atherosclerotic nonaneurysmal thoracic aorta. Normal caliber pulmonary arteries. Mediastinum/Nodes: No significant thyroid nodules. Unremarkable esophagus. No pathologically enlarged axillary, mediastinal or hilar lymph nodes, noting limited sensitivity for the detection of hilar adenopathy on this noncontrast study. Lungs/Pleura: No pneumothorax. No pleural effusion. Diffuse bronchial wall thickening. Extensive patchy centrilobular nodularity and tree-in-bud opacity throughout both lungs with scattered mild-to-moderate regions of patchy consolidation in the lower lobes bilaterally, compatible with multilobar bronchopneumonia. No discrete lung masses. No bronchiectasis. Upper abdomen: Nonobstructing 2 mm upper left renal stone. Exophytic 1.3 cm posterior upper right renal cyst with layering calcification, stable size, compatible with a Bosniak category 2  renal cyst for which no follow-up imaging is recommended. Musculoskeletal: No aggressive appearing focal osseous lesions. Moderate thoracic spondylosis. IMPRESSION: 1. Multilobar bronchopneumonia, most severe in the lower lobes. 2. Three-vessel coronary atherosclerosis. 3. Nonobstructing left nephrolithiasis. Electronically Signed   By: Delbert Phenix M.D.   On: 06/20/2023 10:21   DG Chest Port 1 View Result Date: 06/19/2023 CLINICAL DATA:  Dyspnea for 1 week EXAM: PORTABLE CHEST 1  VIEW COMPARISON:  11/23/2022 chest radiograph. FINDINGS: TAVR in place. Partially visualized surgical hardware from ACDF. Stable cardiomediastinal silhouette with normal heart size. No pneumothorax. No pleural effusion. Generalized mild prominence of the parahilar interstitial markings. No consolidative airspace disease. IMPRESSION: Generalized mild prominence of the parahilar interstitial markings, which could represent mild pulmonary edema or atypical/viral infection. Electronically Signed   By: Delbert Phenix M.D.   On: 06/19/2023 14:26    Microbiology: Results for orders placed or performed during the hospital encounter of 06/19/23  Culture, blood (routine x 2)     Status: None   Collection Time: 06/19/23  2:20 PM   Specimen: BLOOD  Result Value Ref Range Status   Specimen Description BLOOD RIGHT ANTECUBITAL  Final   Special Requests   Final    BOTTLES DRAWN AEROBIC AND ANAEROBIC Blood Culture adequate volume   Culture   Final    NO GROWTH 5 DAYS Performed at [facility], [address] [place], [place] [address]    Report Status 06/24/2023 FINAL  Final  Culture, blood (routine x 2)     Status: None   Collection Time: 06/19/23  2:20 PM   Specimen: BLOOD  Result Value Ref Range Status   Specimen Description BLOOD LEFT ANTECUBITAL  Final   Special Requests   Final    BOTTLES DRAWN AEROBIC AND ANAEROBIC Blood Culture adequate volume   Culture   Final    NO GROWTH 5 DAYS Performed at [facility], [address] [place], [place] [address]    Report Status 06/24/2023 FINAL  Final  Resp panel by RT-PCR (RSV, Flu A&B, Covid) Anterior Nasal Swab     Status: Abnormal   Collection Time: 06/19/23  2:31 PM   Specimen: Anterior Nasal Swab  Result Value Ref Range Status   SARS Coronavirus 2 by RT PCR NEGATIVE NEGATIVE Final    Comment: (NOTE) SARS-CoV-2 target nucleic acids are NOT DETECTED.  The SARS-CoV-2 RNA is generally detectable in upper respiratory specimens during the acute phase of infection. The lowest concentration of SARS-CoV-2 viral copies this assay can detect is 138 copies/mL. A negative result does not preclude SARS-Cov-2 infection and should not be used as the sole basis for treatment or other patient management decisions. A negative result may occur with  improper specimen collection/handling, submission of specimen other than nasopharyngeal swab, presence of viral mutation(s) within the areas targeted by this assay, and inadequate number of viral copies(<138 copies/mL). A negative result must be combined with clinical observations, patient history, and epidemiological information. The expected result is Negative.  Fact Sheet for Patients:  BloggerCourse.com  Fact Sheet for Healthcare Providers:  SeriousBroker.it  This test is no t yet approved or cleared by the [place] FDA and  has been authorized for detection and/or diagnosis of SARS-CoV-2 by FDA under an Emergency Use Authorization (EUA). This EUA will remain  in effect (meaning this test can be used) for the duration of the COVID-19 declaration under Section 564(b)(1) of the Act, 21 U.S.C.section 360bbb-3(b)(1), unless the authorization is terminated  or revoked sooner.       Influenza A by PCR POSITIVE (A) NEGATIVE Final   Influenza B by PCR NEGATIVE NEGATIVE Final    Comment: (NOTE) The Xpert Xpress SARS-CoV-2/FLU/RSV plus assay is intended as an aid in the  diagnosis of influenza from Nasopharyngeal swab specimens and should not be used as a sole basis for treatment. Nasal washings and aspirates are unacceptable for Xpert Xpress SARS-CoV-2/FLU/RSV testing.  Fact Sheet for Patients: BloggerCourse.com  Fact Sheet for Healthcare Providers: SeriousBroker.it  This test is not yet approved or cleared by the [place] FDA and has been authorized for detection and/or diagnosis of SARS-CoV-2 by FDA under an Emergency Use Authorization (EUA). This EUA will remain in effect (meaning this test can be used) for the duration of the COVID-19 declaration under Section 564(b)(1) of the Act, 21 U.S.C. section 360bbb-3(b)(1), unless the authorization is terminated or revoked.     Resp Syncytial Virus by PCR NEGATIVE NEGATIVE Final    Comment: (NOTE) Fact Sheet for Patients: BloggerCourse.com  Fact Sheet for Healthcare Providers: SeriousBroker.it  This test is not yet approved or cleared by the [place] FDA and has been authorized for detection and/or diagnosis of SARS-CoV-2 by FDA under an Emergency Use Authorization (EUA). This EUA will remain in effect (meaning this test can be used) for the duration of the COVID-19 declaration under Section 564(b)(1) of the Act, 21 U.S.C. section 360bbb-3(b)(1), unless the authorization is terminated or revoked.  Performed at [facility], [address], [place], [place] [address]     Labs: CBC: Recent Labs  Lab 06/19/23 1420 06/20/23 0515 06/21/23 0321 06/22/23 0411  WBC 10.2 16.2* 15.0* 14.5*  NEUTROABS 7.5  --  10.5*  --   HGB 12.0 11.9* 10.5* 10.7*  HCT 35.7* 35.6* 31.5* 32.1*  MCV 89.7 89.2 89.7 88.9  PLT 244 254 262 297   Basic Metabolic Panel: Recent Labs  Lab 06/19/23 1420 06/20/23 0515 06/21/23 0321 06/21/23 1803 06/22/23 0411 06/22/23 1407 06/23/23 0418  NA 128* 125* 122*  124* 125* 127* 130*  K 4.4 4.9 4.8  --  4.9  --  4.9  CL 94* 96* 90*  --  93*  --  97*  CO2 23 22 21*  --  23  --  25  GLUCOSE 109* 98 97  --  103*  --  97  BUN 21 24* 30*  --  27*  --  25*  CREATININE 1.00 1.19* 1.27*  --  1.12*  --  0.89  CALCIUM 8.7* 8.3* 8.2*  --  8.5*  --  8.7*  MG  --   --  2.3  --   --   --   --   PHOS  --   --   --   --  3.8  --   --    Liver Function Tests: Recent Labs  Lab 06/19/23 1420 06/23/23 0418  AST 48* 31  ALT 32 30  ALKPHOS 49 52  BILITOT 0.7 0.5  PROT 7.0 6.3*  ALBUMIN 3.1* 2.6*   CBG: No results for input(s): "GLUCAP" in the last 168 hours.  Discharge time spent: greater than 30 minutes.  Signed: Kendell Bane, MD [facility] 06/24/2023

## 2023-06-24 NOTE — Progress Notes (Signed)
 Mobility Specialist Progress Note:    06/24/23 1020  Mobility  Activity Transferred from chair to bed  Level of Assistance Standby assist, set-up cues, supervision of patient - no hands on  Assistive Device None  Distance Ambulated (ft) 5 ft  Range of Motion/Exercises Active;All extremities  Activity Response Tolerated well  Mobility Referral Yes  Mobility visit 1 Mobility  Mobility Specialist Start Time (ACUTE ONLY) 1010  Mobility Specialist Stop Time (ACUTE ONLY) 1020  Mobility Specialist Time Calculation (min) (ACUTE ONLY) 10 min   Pt received in chair requesting assistance to bed. Required SBA to stand and ambulate with no AD. Tolerated well, asx throughout. Left pt supine, alarm on. All needs met.   Lawerance Bach Mobility Specialist Please contact via Special educational needs teacher or  Rehab office at [phone]

## 2023-06-24 NOTE — Progress Notes (Signed)
 Medicated as ordered for continued complaints of cough with relief

## 2023-06-24 NOTE — Care Management Important Message (Signed)
 Important Message  Patient Details  Name: [name] MRN: [id number] Date of Birth: [birth year]   Important Message Given:  Yes - Medicare IM     Corey Harold 06/24/2023, 10:59 AM

## 2023-06-24 NOTE — Progress Notes (Signed)
 Mobility Specialist Progress Note:    06/24/23 0940  Mobility  Activity Ambulated with assistance in hallway;Transferred from bed to chair  Level of Assistance Standby assist, set-up cues, supervision of patient - no hands on  Assistive Device None;Front wheel walker  Distance Ambulated (ft) 100 ft  Range of Motion/Exercises Active;All extremities  Activity Response Tolerated well  Mobility Referral Yes  Mobility visit 1 Mobility  Mobility Specialist Start Time (ACUTE ONLY) 0920  Mobility Specialist Stop Time (ACUTE ONLY) 0940  Mobility Specialist Time Calculation (min) (ACUTE ONLY) 20 min   Pt received in bed, agreeable to mobility. Required SBA to stand and ambulate with no AD. Tolerated well, SpO2 95% on RA at rest, SpO2 93% on RA during ambulation. Returned to room, left pt in chair. Alarm on, call bell in reach. All needs met.   Lawerance Bach Mobility Specialist Please contact via Special educational needs teacher or  Rehab office at [phone]

## 2023-06-25 ENCOUNTER — Telehealth: Payer: Self-pay | Admitting: *Deleted

## 2023-06-25 ENCOUNTER — Encounter: Payer: Self-pay | Admitting: *Deleted

## 2023-06-25 NOTE — Transitions of Care (Post Inpatient/ED Visit) (Signed)
 06/25/2023  Name: [name] MRN: [id number] DOB: [birth year]  Today's TOC FU Call Status: Today's TOC FU Call Status:: Successful TOC FU Call Completed TOC FU Call Complete Date: 06/25/23 Patient's Name and Date of Birth confirmed.  Transition Care Management Follow-up Telephone Call Date of Discharge: 06/24/23 Dis[name] Type of Discharge: Inpatient Admission Primary Inpatient Discharge Diagnosis:: Acute respiratory failure with hypoxia How have you been since you were released from the hospital?:  (pt states " I am better, ate a good breakfast, just taking awhile to get over all this",  ambulating without difficulty, no issues with bowel, bladder) Any questions or concerns?: No  Items Reviewed: Did you receive and understand the discharge instructions provided?: Yes Medications obtained,verified, and reconciled?: Yes (Medications Reviewed) Any new allergies since your discharge?: No Dietary orders reviewed?: Yes Type of Diet Ordered:: heart healthy , low sodium Do you have support at home?: Yes People in Home: spouse, child(ren), adult Name of Support/Comfort Primary Source: spouse [name],  daughter [name] Patient consented to enrollment in [facility] 30 day program   Goals Addressed             This Visit's Progress    TOC Care Plan/ pt will have no readmissions within 30 days       Current Barriers:  Provider appointments pt does not have primary care provider appointment scheduled, RN Care Manager to schedule Knowledge Deficits related to plan of care for management of Influenza type A  Patient lives with spouse and adult daughter, has good support at home Patient checks blood pressure "at times" but not daily, pt had recent medication changes due to hypotension, pt reports she can "tell when blood pressure feels low", states no signs/symptoms hypotension and will keep closer check on blood pressure  RNCM Clinical Goal(s):  Patient will work with the  Care Management team over the next 30 days to address Transition of Care Barriers: Provider appointments verbalize understanding of plan for management of Influenza type A as evidenced by patient report, review of EMR take all medications exactly as prescribed and will call provider for medication related questions as evidenced by patient report, review of EMR  through collaboration with RN Care manager, provider, and care team.   Interventions: Evaluation of current treatment plan related to  self management and patient's adherence to plan as established by provider  Transitions of Care:  New goal. and Goal on track:  Yes. Doctor Visits  - discussed the importance of doctor visits Arranged PCP follow-up within 7 days (Care Guide Scheduled) Reviewed Signs and symptoms of infection Encouraged pt to check blood pressure daily and keep a log, report any low readings to primary care provider  Influenza type A  (Status:  New goal. and Goal on track:  Yes.)  Short Term Goal Evaluation of current treatment plan related to  Influenza type A ,  self-management and patient's adherence to plan as established by provider. Discussed plans with patient for ongoing care management follow up and provided patient with direct contact information for care management team Evaluation of current treatment plan related to Influenza type A and patient's adherence to plan as established by provider Reviewed medications with patient and discussed importance of taking as prescribed, timely refills Discussed plans with patient for ongoing care management follow up and provided patient with direct contact information for care management team Assessed social determinant of health barriers  Patient Goals/Self-Care Activities: Participate in Transition of Care Program/Attend TOC scheduled  calls Notify RN Care Manager of TOC call rescheduling needs Take all medications as prescribed Attend all scheduled provider  appointments Call pharmacy for medication refills 3-7 days in advance of running out of medications Call provider office for new concerns or questions  Appointment made for primary care provider office on 07/01/23 @ 11 am Please check blood pressure daily and keep a log  Follow Up Plan:  Telephone follow up appointment with care management team member scheduled for:  07/02/23 @ 1015 am The patient has been provided with contact information for the care management team and has been advised to call with any health related questions or concerns.           Medications Reviewed Today: Medications Reviewed Today     Reviewed by Audrie Gallus, RN (Registered Nurse) on 06/25/23 at 1113  Med List Status: <None>   Medication Order Taking? Sig Documenting Provider Last Dose Status Informant  acetaminophen (TYLENOL) 500 MG tablet [id number] Yes Take 1,000 mg by mouth daily as needed for moderate pain or headache. [provider] Taking Active Child  albuterol (VENTOLIN HFA) 108 (90 Base) MCG/ACT inhaler [id number] Yes Inhale 2 puffs into the lungs every 6 (six) hours as needed for wheezing or shortness of breath. Kendell Bane, MD Taking Active   Ascorbic Acid (VITAMIN C) 1000 MG tablet [id number] Yes Take 1,000 mg by mouth daily. [provider] Taking Active Child  aspirin EC 81 MG tablet [id number] Yes Take 1 tablet (81 mg total) by mouth daily. Delynn Flavin M, DO Taking Active Child  benzonatate (TESSALON PERLES) 100 MG capsule [id number] Yes Take 1 capsule (100 mg total) by mouth 3 (three) times daily as needed for cough. Kendell Bane, MD Taking Active   furosemide (LASIX) 20 MG tablet [id number] Yes TAKE 1 TABLET BY MOUTH EVERY DAY Wendall Stade, MD Taking Active Child           Med Note Jimmye Norman, Melaya [place]elton A   Fri Jun 25, 2023 11:04 AM) Pt is taking prn  guaiFENesin-dextromethorphan (ROBITUSSIN DM) 100-10 MG/5ML syrup [id number] Yes Take 5 mLs by mouth every 4 (four)  hours as needed for cough. Kendell Bane, MD Taking Active   Magnesium 500 MG CAPS [id number] Yes Take 1 capsule by mouth daily. [provider] Taking Active Child  metoprolol tartrate (LOPRESSOR) 25 MG tablet [id number] Yes Take 1 tablet (25 mg total) by mouth 2 (two) times daily. Kendell Bane, MD Taking Active   nabumetone (RELAFEN) 500 MG tablet [id number] Yes Take 2 tablets (1,000 mg total) by mouth 2 (two) times daily. For muscle and joint pain Mechele Claude, MD Taking Active Child  oseltamivir (TAMIFLU) 30 MG capsule [id number] Yes Take 1 capsule (30 mg total) by mouth 2 (two) times daily for 1 day. Kendell Bane, MD Taking Active   rosuvastatin (CRESTOR) 10 MG tablet [id number] No Take 1 tablet (10 mg total) by mouth daily. Pt needs to keep upcoming appt in Jan, 2025 for additional refills  Patient not taking: Reported on 06/25/2023   Chilton Si, MD Not Taking Active Child            Home Care and Equipment/Supplies: Were Home Health Services Ordered?: No Any new equipment or medical supplies ordered?: No  Functional Questionnaire: Do you need assistance with bathing/showering or dressing?: No Do you need assistance with meal preparation?: Yes (family assists) Do you need assistance with eating?: No Do you have difficulty maintaining continence: No Do  you need assistance with getting out of bed/getting out of a chair/moving?: No Do you have difficulty managing or taking your medications?: No  Follow up appointments reviewed: PCP Follow-up appointment confirmed?: Yes Date of PCP follow-up appointment?: 06/22/23 (@ 11 am) Follow-up Provider: collaborated wtih care guide to scheduled post hospital appointment, primary care provider Delynn Flavin has no appointments until April  will see Martina Sinner, pt verbalizes understanding Specialist Hospital Follow-up appointment confirmed?: NA Do you need transportation to your follow-up  appointment?: No Do you understand care options if your condition(s) worsen?: Yes-patient verbalized understanding  SDOH Interventions Today    Flowsheet Row Most Recent Value  SDOH Interventions   Food Insecurity Interventions Intervention Not Indicated  Housing Interventions Intervention Not Indicated  Transportation Interventions Intervention Not Indicated  Utilities Interventions Intervention Not Indicated       Irving Shows [facility], BSN RN Care Manager/ Transition of Care [place]/ [facility] [phone]

## 2023-06-26 ENCOUNTER — Other Ambulatory Visit (HOSPITAL_BASED_OUTPATIENT_CLINIC_OR_DEPARTMENT_OTHER): Payer: Self-pay | Admitting: Cardiovascular Disease

## 2023-07-01 ENCOUNTER — Ambulatory Visit (INDEPENDENT_AMBULATORY_CARE_PROVIDER_SITE_OTHER): Admitting: Family Medicine

## 2023-07-01 ENCOUNTER — Inpatient Hospital Stay: Admitting: Nurse Practitioner

## 2023-07-01 ENCOUNTER — Encounter: Payer: Self-pay | Admitting: Family Medicine

## 2023-07-01 VITALS — BP 166/53 | Temp 98.2°F | Ht 63.0 in | Wt 141.0 lb

## 2023-07-01 DIAGNOSIS — B351 Tinea unguium: Secondary | ICD-10-CM | POA: Diagnosis not present

## 2023-07-01 DIAGNOSIS — I10 Essential (primary) hypertension: Secondary | ICD-10-CM | POA: Diagnosis not present

## 2023-07-01 DIAGNOSIS — R600 Localized edema: Secondary | ICD-10-CM | POA: Diagnosis not present

## 2023-07-01 DIAGNOSIS — R051 Acute cough: Secondary | ICD-10-CM

## 2023-07-01 DIAGNOSIS — M79676 Pain in unspecified toe(s): Secondary | ICD-10-CM | POA: Diagnosis not present

## 2023-07-01 DIAGNOSIS — Z952 Presence of prosthetic heart valve: Secondary | ICD-10-CM | POA: Diagnosis not present

## 2023-07-01 DIAGNOSIS — J9601 Acute respiratory failure with hypoxia: Secondary | ICD-10-CM | POA: Diagnosis not present

## 2023-07-01 DIAGNOSIS — E871 Hypo-osmolality and hyponatremia: Secondary | ICD-10-CM

## 2023-07-01 DIAGNOSIS — R0989 Other specified symptoms and signs involving the circulatory and respiratory systems: Secondary | ICD-10-CM

## 2023-07-01 MED ORDER — BENZONATATE 100 MG PO CAPS: 100.0000 mg | ORAL_CAPSULE | Freq: Three times a day (TID) | ORAL | 0 refills | Status: DC | PRN

## 2023-07-01 MED ORDER — AMLODIPINE BESYLATE 2.5 MG PO TABS: 2.5000 mg | ORAL_TABLET | Freq: Every day | ORAL | Status: DC

## 2023-07-01 NOTE — Progress Notes (Signed)
 Subjective:  Patient ID: [name], female    DOB: 09-13-1935, 88 y.o.   MRN: [id number]  Patient Care Team: Raliegh Ip, DO as PCP - General (Family Medicine) Wendall Stade, MD as PCP - Cardiology (Cardiology) Wendall Stade, MD as Consulting Physician (Cardiology) Louis Meckel, MD (Inactive) as Consulting Physician (Gastroenterology) Aplington, Illene Labrador, MD (Inactive) as Consulting Physician (Orthopedic Surgery) Milas Gain, MD as Referring Physician (Neurology) Jethro Bolus, MD as Consulting Physician (Ophthalmology)   Chief Complaint:  Hospitalization Follow-up (acute respiratory failure w/ hypoxia/hypotension)  HPI: [name] is a 88 y.o. female presenting on 07/01/2023 for Hospitalization Follow-up (acute respiratory failure w/ hypoxia/hypotension) Patient was admitted 06/19/23 for acute respiratory failure with hypoxia secondary to influenza pneumonia, complicated by hyponatremia, pulmonary edema. Has PMH of severe AS s/p TAVR, HTN, HLD, fibromyalgia. Required bipap therapy, weaned to room air. Completed course of azithromycin and ceftriaxone. Elevated troponin attributed to demand ischemia. Instructed to hold hydralazine and amlodipine due to hypotension. Since coming home, states that she is having a hard time recuperating. States that yesterday her BP was up to 190 systolic. States that she still feels sick, especially when her BP is up. Endorses nausea and headaches. She is still coughing and would like a refill of tessalon perles. States that her appetite is improving slightly. Has not taken any lasix since coming home. Denies shortness of breath or trouble breathing. Endorses chest tightness with cough. She has albuterol inhaler. States that last night she used it 2-3 times due to shortness of breath.    Relevant past medical, surgical, family, and social history reviewed and updated as indicated.  Allergies and medications reviewed and updated. Data  reviewed: Chart in Epic.   Past Medical History:  Diagnosis Date   Chronic headaches    Fibromyalgia    GERD (gastroesophageal reflux disease)    Hemorrhoids    INTERNAL--  POST BANDING 02/ 2015   History of kidney stones    Hyperlipidemia    Hypertension    Moderate aortic stenosis    NSAID long-term use 05/30/2013   OSA (obstructive sleep apnea) CPAP INTOLERANT    Osteoporosis    S/P TAVR (transcatheter aortic valve replacement) 10/19/2017   20 mm [place] Sapien 3 transcatheter heart valve placed via percutaneous right transfemoral approach    Severe aortic stenosis    Spinal stenosis, multilevel     Past Surgical History:  Procedure Laterality Date   ANTERIOR CERVICAL DECOMP/DISCECTOMY FUSION  11-11-1999   C5 - C6   APPENDECTOMY  AGE 80   CARDIAC CATHETERIZATION  2008  DR [facility]   ESSENTIALLY NORMAL   CATARACT EXTRACTION Bilateral    CATARACT EXTRACTION W/ INTRAOCULAR LENS  IMPLANT, BILATERAL  2012   EYE SURGERY     FLEXIBLE SIGMOIDOSCOPY N/A 06/01/2013   Procedure: FLEXIBLE SIGMOIDOSCOPY;  Surgeon: Louis Meckel, MD;  Location: [facility];  Service: Endoscopy;  Laterality: N/A;  may need hemorrhoidal banding   HEMORRHOIDECTOMY WITH HEMORRHOID BANDING  08-07-2010   IR RADIOLOGY PERIPHERAL GUIDED IV START  06/27/2019   IR US GUIDE VASC ACCESS RIGHT  06/27/2019   LAPAROSCOPIC CHOLECYSTECTOMY  1990   RIGHT/LEFT HEART CATH AND CORONARY ANGIOGRAPHY N/A 09/29/2017   Procedure: RIGHT/LEFT HEART CATH AND CORONARY ANGIOGRAPHY;  Surgeon: Tonny Bollman, MD;  Location: [facility] INVASIVE CV LAB;  Service: Cardiovascular;  Laterality: N/A;   SHOULDER ARTHROSCOPY WITH OPEN ROTATOR CUFF REPAIR AND DISTAL CLAVICLE ACROMINECTOMY Right 05/25/2012  Procedure: SHOULDER ARTHROSCOPY WITH OPEN ROTATOR CUFF REPAIR AND DISTAL CLAVICLE ACROMINECTOMY;  Surgeon: Drucilla Schmidt, MD;  Location: [facility];  Service: Orthopedics;  Laterality: Right;  RIGHT SHOULDER ARTHROSCOPY WITH  DERBRIDEMENTOF LABRAL/BICEP TENDON, OPEN DISTAL CLAVICLE RESECTION, ANTERIOR ACROMINECTOMY ROTATOR CUFF REPAIR ANESTHESIA: GENERAL/SCALENE NERVE BLOCK   TEE WITHOUT CARDIOVERSION Bilateral 10/19/2017   Procedure: TRANSESOPHAGEAL ECHOCARDIOGRAM (TEE);  Surgeon: Tonny Bollman, MD;  Location: [facility] OR;  Service: Open Heart Surgery;  Laterality: Bilateral;   TENOSYNOVECTOMY Left 01/04/2014   Procedure: LEFT WRIST EXTENSOR TENOSYNOVECTOMY;  Surgeon: Sharma Covert, MD;  Location: [facility];  Service: Orthopedics;  Laterality: Left;   THYROIDECTOMY  AGE 58   GOITER   TONSILLECTOMY AND ADENOIDECTOMY  AGE 50   TRANSCATHETER AORTIC VALVE REPLACEMENT, TRANSFEMORAL  10/19/2017   TRANSCATHETER AORTIC VALVE REPLACEMENT, TRANSFEMORAL Bilateral 10/19/2017   Procedure: TRANSCATHETER AORTIC VALVE REPLACEMENT, TRANSFEMORAL;  Surgeon: Tonny Bollman, MD;  Location: [facility] OR;  Service: Open Heart Surgery;  Laterality: Bilateral;   TRANSTHORACIC ECHOCARDIOGRAM  last one 04-20-2013  [facility]    NORMAL LVSF/ EF 60-65%/ MODERATE  AV  STENOSIS WITH NO AR /  MILD LAE   UMBILICAL HERNIA REPAIR  JUNE 2006   VAGINAL HYSTERECTOMY  01-31-2001   ANTERIOR & POSTERIOR REPAIR/ TRANSVAGINAL BLADDER SLING   Social History   Socioeconomic History   Marital status: Married    Spouse name: Camara Renstrom   Number of children: 6   Years of education: 6   Highest education level: 8th grade  Occupational History   Occupation: Retired  Tobacco Use   Smoking status: Former    Current packs/day: 0.00    Average packs/day: 0.3 packs/day for 5.0 years (1.3 ttl pk-yrs)    Types: Cigarettes    Start date: 04/13/1998    Quit date: 04/14/2003    Years since quitting: 20.2    Passive exposure: Past   Smokeless tobacco: Never  Vaping Use   Vaping status: Never Used  Substance and Sexual Activity   Alcohol use: Yes    Comment: once in a while-social   Drug use: Never    Comment: hemp oil    Sexual activity: Not Currently     Partners: Male  Other Topics Concern   Not on file  Social History Narrative   Not on file   Social Drivers of Health   Financial Resource Strain: Low Risk  (11/22/2022)   Overall Financial Resource Strain (CARDIA)    Difficulty of Paying Living Expenses: Not hard at all  Food Insecurity: No Food Insecurity (06/25/2023)   Hunger Vital Sign    Worried About Running Out of Food in the Last Year: Never true    Ran Out of Food in the Last Year: Never true  Transportation Needs: No Transportation Needs (06/25/2023)   PRAPARE - Administrator, Civil Service (Medical): No    Lack of Transportation (Non-Medical): No  Physical Activity: Inactive (11/22/2022)   Exercise Vital Sign    Days of Exercise per Week: 0 days    Minutes of Exercise per Session: 0 min  Stress: No Stress Concern Present (11/22/2022)   [employer] of Occupational Health - Occupational Stress Questionnaire    Feeling of Stress : Not at all  Social Connections: Moderately Integrated (06/19/2023)   Social Connection and Isolation Panel [NHANES]    Frequency of Communication with Friends and Family: More than three times a week    Frequency of Social Gatherings with  Friends and Family: More than three times a week    Attends Religious Services: 1 to 4 times per year    Active Member of Clubs or Organizations: No    Attends Banker Meetings: Never    Marital Status: Married  Catering manager Violence: Not At Risk (06/25/2023)   Humiliation, Afraid, Rape, and Kick questionnaire    Fear of Current or Ex-Partner: No    Emotionally Abused: No    Physically Abused: No    Sexually Abused: No    Outpatient Encounter Medications as of 07/01/2023  Medication Sig   acetaminophen (TYLENOL) 500 MG tablet Take 1,000 mg by mouth daily as needed for moderate pain or headache.   albuterol (VENTOLIN HFA) 108 (90 Base) MCG/ACT inhaler Inhale 2 puffs into the lungs every 6 (six) hours as needed for wheezing  or shortness of breath.   Ascorbic Acid (VITAMIN C) 1000 MG tablet Take 1,000 mg by mouth daily.   aspirin EC 81 MG tablet Take 1 tablet (81 mg total) by mouth daily.   benzonatate (TESSALON PERLES) 100 MG capsule Take 1 capsule (100 mg total) by mouth 3 (three) times daily as needed for cough.   furosemide (LASIX) 20 MG tablet TAKE 1 TABLET BY MOUTH EVERY DAY   guaiFENesin-dextromethorphan (ROBITUSSIN DM) 100-10 MG/5ML syrup Take 5 mLs by mouth every 4 (four) hours as needed for cough.   Magnesium 500 MG CAPS Take 1 capsule by mouth daily.   metoprolol tartrate (LOPRESSOR) 25 MG tablet Take 1 tablet (25 mg total) by mouth 2 (two) times daily.   nabumetone (RELAFEN) 500 MG tablet Take 2 tablets (1,000 mg total) by mouth 2 (two) times daily. For muscle and joint pain   rosuvastatin (CRESTOR) 10 MG tablet TAKE 1 TABLET (10 MG TOTAL) BY MOUTH DAILY   No facility-administered encounter medications on file as of 07/01/2023.    Allergies  Allergen Reactions   Prolia [Denosumab] Other (See Comments)    Arthralgia/myalgia/jaw pain/headache   Tape Rash and Other (See Comments)    Dermatitis rash "with extended exposure"    Review of Systems As per HPI  Objective:  BP (!) 166/53   Temp 98.2 F (36.8 C)   Ht 5\' 3"  (1.6 m)   Wt 141 lb (64 kg)   SpO2 97%   BMI 24.98 kg/m    Wt Readings from Last 3 Encounters:  07/01/23 141 lb (64 kg)  06/24/23 139 lb 8.8 oz (63.3 kg)  05/13/23 150 lb (68 kg)   Physical Exam Constitutional:      General: She is awake. She is not in acute distress.    Appearance: Normal appearance. She is well-developed and well-groomed. She is ill-appearing.     Comments: Chronically ill-appearing, blue hue to skin   Cardiovascular:     Rate and Rhythm: Normal rate and regular rhythm.     Heart sounds: Murmur heard.     Systolic murmur is present with a grade of 5/6.  Pulmonary:     Effort: Pulmonary effort is normal.     Breath sounds: Examination of the  right-lower field reveals rhonchi and rales. Rhonchi and rales present. No decreased breath sounds or wheezing.  Musculoskeletal:     Right lower leg: No edema.     Left lower leg: 1+ Edema present.  Lymphadenopathy:     Head:     Right side of head: No submental, submandibular, tonsillar, preauricular or posterior auricular adenopathy.     Left  side of head: No submental, submandibular, tonsillar, preauricular or posterior auricular adenopathy.  Skin:    General: Skin is warm.     Capillary Refill: Capillary refill takes less than 2 seconds.  Neurological:     General: No focal deficit present.     Mental Status: She is alert, oriented to person, place, and time and easily aroused. Mental status is at baseline.  Psychiatric:        Attention and Perception: Attention and perception normal.        Mood and Affect: Mood and affect normal.        Speech: Speech normal.        Behavior: Behavior normal. Behavior is cooperative.        Thought Content: Thought content normal.        Cognition and Memory: Cognition and memory normal.        Judgment: Judgment normal.      Results for orders placed or performed during the hospital encounter of 06/19/23  Culture, blood (routine x 2)   Collection Time: 06/19/23  2:20 PM   Specimen: BLOOD  Result Value Ref Range   Specimen Description BLOOD RIGHT ANTECUBITAL    Special Requests      BOTTLES DRAWN AEROBIC AND ANAEROBIC Blood Culture adequate volume   Culture      NO GROWTH 5 DAYS Performed at [facility], [address], [place], [place] [address]    Report Status 06/24/2023 FINAL   Culture, blood (routine x 2)   Collection Time: 06/19/23  2:20 PM   Specimen: BLOOD  Result Value Ref Range   Specimen Description BLOOD LEFT ANTECUBITAL    Special Requests      BOTTLES DRAWN AEROBIC AND ANAEROBIC Blood Culture adequate volume   Culture      NO GROWTH 5 DAYS Performed at [facility], [address] [place], [place] [address]     Report Status 06/24/2023 FINAL   Comprehensive metabolic panel   Collection Time: 06/19/23  2:20 PM  Result Value Ref Range   Sodium 128 (L) 135 - 145 mmol/L   Potassium 4.4 3.5 - 5.1 mmol/L   Chloride 94 (L) 98 - 111 mmol/L   CO2 23 22 - 32 mmol/L   Glucose, Bld 109 (H) 70 - 99 mg/dL   BUN 21 8 - 23 mg/dL   Creatinine, Ser 8.29 0.44 - 1.00 mg/dL   Calcium 8.7 (L) 8.9 - 10.3 mg/dL   Total Protein 7.0 6.5 - 8.1 g/dL   Albumin 3.1 (L) 3.5 - 5.0 g/dL   AST 48 (H) 15 - 41 U/L   ALT 32 0 - 44 U/L   Alkaline Phosphatase 49 38 - 126 U/L   Total Bilirubin 0.7 0.0 - 1.2 mg/dL   GFR, Estimated 55 (L) >60 mL/min   Anion gap 11 5 - 15  Lactic acid, plasma   Collection Time: 06/19/23  2:20 PM  Result Value Ref Range   Lactic Acid, Venous 1.5 0.5 - 1.9 mmol/L  CBC with Differential   Collection Time: 06/19/23  2:20 PM  Result Value Ref Range   WBC 10.2 4.0 - 10.5 K/uL   RBC 3.98 3.87 - 5.11 MIL/uL   Hemoglobin 12.0 12.0 - 15.0 g/dL   HCT 56.2 (L) 13.0 - 86.5 %   MCV 89.7 80.0 - 100.0 fL   MCH 30.2 26.0 - 34.0 pg   MCHC 33.6 30.0 - 36.0 g/dL   RDW 78.4 69.6 - 29.5 %  Platelets 244 150 - 400 K/uL   nRBC 0.0 0.0 - 0.2 %   Neutrophils Relative % 74 %   Neutro Abs 7.5 1.7 - 7.7 K/uL   Lymphocytes Relative 16 %   Lymphs Abs 1.6 0.7 - 4.0 K/uL   Monocytes Relative 10 %   Monocytes Absolute 1.1 (H) 0.1 - 1.0 K/uL   Eosinophils Relative 0 %   Eosinophils Absolute 0.0 0.0 - 0.5 K/uL   Basophils Relative 0 %   Basophils Absolute 0.0 0.0 - 0.1 K/uL   Immature Granulocytes 0 %   Abs Immature Granulocytes 0.04 0.00 - 0.07 K/uL  Brain natriuretic peptide   Collection Time: 06/19/23  2:20 PM  Result Value Ref Range   B Natriuretic Peptide 1,203.0 (H) 0.0 - 100.0 pg/mL  Troponin I (High Sensitivity)   Collection Time: 06/19/23  2:20 PM  Result Value Ref Range   Troponin I (High Sensitivity) 384 (HH) <18 ng/L  Resp panel by RT-PCR (RSV, Flu A&B, Covid) Anterior Nasal Swab   Collection  Time: 06/19/23  2:31 PM   Specimen: Anterior Nasal Swab  Result Value Ref Range   SARS Coronavirus 2 by RT PCR NEGATIVE NEGATIVE   Influenza A by PCR POSITIVE (A) NEGATIVE   Influenza B by PCR NEGATIVE NEGATIVE   Resp Syncytial Virus by PCR NEGATIVE NEGATIVE  Troponin I (High Sensitivity)   Collection Time: 06/19/23  3:57 PM  Result Value Ref Range   Troponin I (High Sensitivity) 333 (HH) <18 ng/L  Troponin I (High Sensitivity)   Collection Time: 06/19/23 10:51 PM  Result Value Ref Range   Troponin I (High Sensitivity) 221 (HH) <18 ng/L  CBC   Collection Time: 06/20/23  5:15 AM  Result Value Ref Range   WBC 16.2 (H) 4.0 - 10.5 K/uL   RBC 3.99 3.87 - 5.11 MIL/uL   Hemoglobin 11.9 (L) 12.0 - 15.0 g/dL   HCT 09.8 (L) 11.9 - 14.7 %   MCV 89.2 80.0 - 100.0 fL   MCH 29.8 26.0 - 34.0 pg   MCHC 33.4 30.0 - 36.0 g/dL   RDW 82.9 56.2 - 13.0 %   Platelets 254 150 - 400 K/uL   nRBC 0.0 0.0 - 0.2 %  Basic metabolic panel   Collection Time: 06/20/23  5:15 AM  Result Value Ref Range   Sodium 125 (L) 135 - 145 mmol/L   Potassium 4.9 3.5 - 5.1 mmol/L   Chloride 96 (L) 98 - 111 mmol/L   CO2 22 22 - 32 mmol/L   Glucose, Bld 98 70 - 99 mg/dL   BUN 24 (H) 8 - 23 mg/dL   Creatinine, Ser 8.65 (H) 0.44 - 1.00 mg/dL   Calcium 8.3 (L) 8.9 - 10.3 mg/dL   GFR, Estimated 45 (L) >60 mL/min   Anion gap 7 5 - 15  Troponin I (High Sensitivity)   Collection Time: 06/20/23  5:15 AM  Result Value Ref Range   Troponin I (High Sensitivity) 220 (HH) <18 ng/L  Urinalysis, w/ Reflex to Culture (Infection Suspected) -Urine, Clean Catch   Collection Time: 06/20/23  8:56 AM  Result Value Ref Range   Specimen Source URINE, CLEAN CATCH    Color, Urine YELLOW YELLOW   APPearance CLEAR CLEAR   Specific Gravity, Urine 1.008 1.005 - 1.030   pH 5.0 5.0 - 8.0   Glucose, UA NEGATIVE NEGATIVE mg/dL   Hgb urine dipstick MODERATE (A) NEGATIVE   Bilirubin Urine NEGATIVE NEGATIVE   Ketones, ur  NEGATIVE NEGATIVE mg/dL    Protein, ur NEGATIVE NEGATIVE mg/dL   Nitrite NEGATIVE NEGATIVE   Leukocytes,Ua NEGATIVE NEGATIVE   RBC / HPF 0-5 0 - 5 RBC/hpf   WBC, UA 0-5 0 - 5 WBC/hpf   Bacteria, UA RARE (A) NONE SEEN   Squamous Epithelial / HPF 0-5 0 - 5 /HPF   Mucus PRESENT   Osmolality, urine   Collection Time: 06/20/23  8:56 AM  Result Value Ref Range   Osmolality, Ur 299 (L) 300 - 900 mOsm/kg  Sodium, urine, random   Collection Time: 06/20/23  8:56 AM  Result Value Ref Range   Sodium, Ur 90 mmol/L  Creatinine, urine, random   Collection Time: 06/20/23  8:56 AM  Result Value Ref Range   Creatinine, Urine 40 mg/dL  ECHOCARDIOGRAM COMPLETE   Collection Time: 06/20/23  9:49 AM  Result Value Ref Range   Weight 2,368 oz   Height 63 in   BP 127/57 mmHg   Single Plane A2C EF 43.7 %   Single Plane A4C EF 68.9 %   Calc EF 58.5 %   AR max vel 1.85 cm2   AV Area VTI 1.66 cm2   AV Mean grad 9.6 mmHg   AV Peak grad 18.0 mmHg   Ao pk vel 2.12 m/s   AV Area mean vel 1.81 cm2   MV VTI 2.36 cm2   Area-P 1/2 4.68 cm2   S' Lateral 2.40 cm   Est EF > 75%   Procalcitonin   Collection Time: 06/20/23 10:23 AM  Result Value Ref Range   Procalcitonin 0.64 ng/mL  Blood gas, venous   Collection Time: 06/20/23 10:23 AM  Result Value Ref Range   pH, Ven 7.44 (H) 7.25 - 7.43   pCO2, Ven 40 (L) 44 - 60 mmHg   pO2, Ven 44 32 - 45 mmHg   Bicarbonate 27.2 20.0 - 28.0 mmol/L   Acid-Base Excess 2.8 (H) 0.0 - 2.0 mmol/L   O2 Saturation 73.3 %   Patient temperature 37.2    Collection site BLOOD LEFT HAND    Drawn by [id number]   Basic metabolic panel   Collection Time: 06/21/23  3:21 AM  Result Value Ref Range   Sodium 122 (L) 135 - 145 mmol/L   Potassium 4.8 3.5 - 5.1 mmol/L   Chloride 90 (L) 98 - 111 mmol/L   CO2 21 (L) 22 - 32 mmol/L   Glucose, Bld 97 70 - 99 mg/dL   BUN 30 (H) 8 - 23 mg/dL   Creatinine, Ser 6.21 (H) 0.44 - 1.00 mg/dL   Calcium 8.2 (L) 8.9 - 10.3 mg/dL   GFR, Estimated 41 (L) >60 mL/min    Anion gap 11 5 - 15  Magnesium   Collection Time: 06/21/23  3:21 AM  Result Value Ref Range   Magnesium 2.3 1.7 - 2.4 mg/dL  CBC with Differential/Platelet   Collection Time: 06/21/23  3:21 AM  Result Value Ref Range   WBC 15.0 (H) 4.0 - 10.5 K/uL   RBC 3.51 (L) 3.87 - 5.11 MIL/uL   Hemoglobin 10.5 (L) 12.0 - 15.0 g/dL   HCT 30.8 (L) 65.7 - 84.6 %   MCV 89.7 80.0 - 100.0 fL   MCH 29.9 26.0 - 34.0 pg   MCHC 33.3 30.0 - 36.0 g/dL   RDW 96.2 95.2 - 84.1 %   Platelets 262 150 - 400 K/uL   nRBC 0.0 0.0 - 0.2 %   Neutrophils Relative % 70 %  Neutro Abs 10.5 (H) 1.7 - 7.7 K/uL   Lymphocytes Relative 16 %   Lymphs Abs 2.4 0.7 - 4.0 K/uL   Monocytes Relative 14 %   Monocytes Absolute 2.1 (H) 0.1 - 1.0 K/uL   Eosinophils Relative 0 %   Eosinophils Absolute 0.0 0.0 - 0.5 K/uL   Basophils Relative 0 %   Basophils Absolute 0.0 0.0 - 0.1 K/uL   RBC Morphology MORPHOLOGY UNREMARKABLE    Smear Review MORPHOLOGY UNREMARKABLE    Abs Immature Granulocytes 0.00 0.00 - 0.07 K/uL   Reactive, Benign Lymphocytes PRESENT   Procalcitonin   Collection Time: 06/21/23  3:21 AM  Result Value Ref Range   Procalcitonin 0.67 ng/mL  Osmolality   Collection Time: 06/21/23  9:05 AM  Result Value Ref Range   Osmolality 271 (L) 275 - 295 mOsm/kg  Sodium   Collection Time: 06/21/23  6:03 PM  Result Value Ref Range   Sodium 124 (L) 135 - 145 mmol/L  CBC   Collection Time: 06/22/23  4:11 AM  Result Value Ref Range   WBC 14.5 (H) 4.0 - 10.5 K/uL   RBC 3.61 (L) 3.87 - 5.11 MIL/uL   Hemoglobin 10.7 (L) 12.0 - 15.0 g/dL   HCT 16.1 (L) 09.6 - 04.5 %   MCV 88.9 80.0 - 100.0 fL   MCH 29.6 26.0 - 34.0 pg   MCHC 33.3 30.0 - 36.0 g/dL   RDW 40.9 81.1 - 91.4 %   Platelets 297 150 - 400 K/uL   nRBC 0.0 0.0 - 0.2 %  Basic metabolic panel   Collection Time: 06/22/23  4:11 AM  Result Value Ref Range   Sodium 125 (L) 135 - 145 mmol/L   Potassium 4.9 3.5 - 5.1 mmol/L   Chloride 93 (L) 98 - 111 mmol/L   CO2 23  22 - 32 mmol/L   Glucose, Bld 103 (H) 70 - 99 mg/dL   BUN 27 (H) 8 - 23 mg/dL   Creatinine, Ser 7.82 (H) 0.44 - 1.00 mg/dL   Calcium 8.5 (L) 8.9 - 10.3 mg/dL   GFR, Estimated 48 (L) >60 mL/min   Anion gap 9 5 - 15  Phosphorus   Collection Time: 06/22/23  4:11 AM  Result Value Ref Range   Phosphorus 3.8 2.5 - 4.6 mg/dL  Sodium   Collection Time: 06/22/23  2:07 PM  Result Value Ref Range   Sodium 127 (L) 135 - 145 mmol/L  Comprehensive metabolic panel   Collection Time: 06/23/23  4:18 AM  Result Value Ref Range   Sodium 130 (L) 135 - 145 mmol/L   Potassium 4.9 3.5 - 5.1 mmol/L   Chloride 97 (L) 98 - 111 mmol/L   CO2 25 22 - 32 mmol/L   Glucose, Bld 97 70 - 99 mg/dL   BUN 25 (H) 8 - 23 mg/dL   Creatinine, Ser 9.56 0.44 - 1.00 mg/dL   Calcium 8.7 (L) 8.9 - 10.3 mg/dL   Total Protein 6.3 (L) 6.5 - 8.1 g/dL   Albumin 2.6 (L) 3.5 - 5.0 g/dL   AST 31 15 - 41 U/L   ALT 30 0 - 44 U/L   Alkaline Phosphatase 52 38 - 126 U/L   Total Bilirubin 0.5 0.0 - 1.2 mg/dL   GFR, Estimated >21 >30 mL/min   Anion gap 8 5 - 15       05/13/2023   10:43 AM 01/11/2023    9:40 AM 12/28/2022    1:35 PM 12/28/2022  1:28 PM 11/25/2022    2:57 PM  Depression screen PHQ 2/9  Decreased Interest 0 0 0 0 0  Down, Depressed, Hopeless 0 0 0 0 0  PHQ - 2 Score 0 0 0 0 0  Altered sleeping 1 0 0    Tired, decreased energy 1 0 1    Change in appetite 0 1 0    Feeling bad or failure about yourself  0 0 0    Trouble concentrating 0 0 0    Moving slowly or fidgety/restless 0 0 3    Suicidal thoughts 0 0 0    PHQ-9 Score 2 1 4     Difficult doing work/chores Somewhat difficult Not difficult at all Somewhat difficult         05/13/2023   10:43 AM 12/28/2022    1:35 PM 11/23/2022   10:44 AM 11/12/2022   11:05 AM  GAD 7 : Generalized Anxiety Score  Nervous, Anxious, on Edge 0 2 0 0  Control/stop worrying 0 1 0 0  Worry too much - different things 0 0 0 0  Trouble relaxing 1 0 0 1  Restless 0 0 0 0  Easily  annoyed or irritable 1 1 0 0  Afraid - awful might happen 0 0 0 0  Total GAD 7 Score 2 4 0 1  Anxiety Difficulty  Not difficult at all     Pertinent labs & imaging results that were available during my care of the patient were reviewed by me and considered in my medical decision making.  Assessment & Plan:  Natalin was seen today for hospitalization follow-up.  Diagnoses and all orders for this visit:  1. Acute respiratory failure with hypoxia (HCC) (Primary) Reviewed notes from admission Lu Verne, J, DO 06/19/23. Reviewed notes from discharge Shahmehdi, MD 06/24/23. Symptoms have improved. Continues to have cough. Discussed the use of albuterol inhaler. Labs as below. Will communicate results to patient once available. Will await results to determine next steps.  - CBC with Differential/Platelet - CMP14+EGFR  2. Essential hypertension Elevated BP in office today and at home. Will restart low dose amlodipine. Patient to continue to monitor BP at home and follow up closely with PCP to monitor BP. Labs as below. Will communicate results to patient once available. Will await results to determine next steps.  - CBC with Differential/Platelet - CMP14+EGFR - amLODipine (NORVASC) 2.5 MG tablet; Take 1 tablet (2.5 mg total) by mouth daily.  3. S/P TAVR (transcatheter aortic valve replacement) Murmur present. Established with cardiology. Patient to continue to follow up with specialty.   4. Hyponatremia Labs as below. Will communicate results to patient once available. Will await results to determine next steps.  Clarified with patient that that she is not on a sodium restriction currently due to hyponatremia.  - CBC with Differential/Platelet - CMP14+EGFR  5. Acute cough Refill provided. Encouraged patient to take dose of lasix today given continued cough, rhonchi, and swelling in lower extremity. Reviewed lasix instructions as she had not taken any since discharge from hospital.  - benzonatate  (TESSALON PERLES) 100 MG capsule; Take 1 capsule (100 mg total) by mouth 3 (three) times daily as needed for cough.  Dispense: 30 capsule; Refill: 0  6. Rhonchi at right lung base As above.   7. Edema of left lower leg As above.   Continue all other maintenance medications.  Follow up plan: Return in about 2 weeks (around 07/15/2023) for with PCP .   Continue healthy lifestyle  choices, including diet (rich in fruits, vegetables, and lean proteins, and low in salt and simple carbohydrates) and exercise (at least 30 minutes of moderate physical activity daily).  Written and verbal instructions provided   The above assessment and management plan was discussed with the patient. The patient verbalized understanding of and has agreed to the management plan. Patient is aware to call the clinic if they develop any new symptoms or if symptoms persist or worsen. Patient is aware when to return to the clinic for a follow-up visit. Patient educated on when it is appropriate to go to the emergency department.   Neale Burly, DNP-FNP [facility] [facility] [address] [place], [place] [address] [phone]

## 2023-07-02 ENCOUNTER — Other Ambulatory Visit: Payer: Self-pay | Admitting: *Deleted

## 2023-07-02 ENCOUNTER — Encounter: Payer: Self-pay | Admitting: Family Medicine

## 2023-07-02 LAB — CMP14+EGFR
ALT: 22 IU/L (ref 0–32)
AST: 24 IU/L (ref 0–40)
Albumin: 3.8 g/dL (ref 3.7–4.7)
Alkaline Phosphatase: 62 IU/L (ref 44–121)
BUN/Creatinine Ratio: 14 (ref 12–28)
BUN: 14 mg/dL (ref 8–27)
Bilirubin Total: 0.3 mg/dL (ref 0.0–1.2)
CO2: 24 mmol/L (ref 20–29)
Calcium: 9.5 mg/dL (ref 8.7–10.3)
Chloride: 99 mmol/L (ref 96–106)
Creatinine, Ser: 1.01 mg/dL — ABNORMAL HIGH (ref 0.57–1.00)
Globulin, Total: 2.7 g/dL (ref 1.5–4.5)
Glucose: 105 mg/dL — ABNORMAL HIGH (ref 70–99)
Potassium: 4.8 mmol/L (ref 3.5–5.2)
Sodium: 136 mmol/L (ref 134–144)
Total Protein: 6.5 g/dL (ref 6.0–8.5)
eGFR: 55 mL/min/{1.73_m2} — ABNORMAL LOW (ref >59)

## 2023-07-02 LAB — CBC WITH DIFFERENTIAL/PLATELET
Basophils Absolute: 0.1 10*3/uL (ref 0.0–0.2)
Basos: 1 %
EOS (ABSOLUTE): 0.1 10*3/uL (ref 0.0–0.4)
Eos: 2 %
Hematocrit: 34 % (ref 34.0–46.6)
Hemoglobin: 11.6 g/dL (ref 11.1–15.9)
Immature Grans (Abs): 0 10*3/uL (ref 0.0–0.1)
Immature Granulocytes: 1 %
Lymphocytes Absolute: 1.6 10*3/uL (ref 0.7–3.1)
Lymphs: 25 %
MCH: 30.2 pg (ref 26.6–33.0)
MCHC: 34.1 g/dL (ref 31.5–35.7)
MCV: 89 fL (ref 79–97)
Monocytes Absolute: 0.6 10*3/uL (ref 0.1–0.9)
Monocytes: 9 %
Neutrophils Absolute: 4 10*3/uL (ref 1.4–7.0)
Neutrophils: 62 %
Platelets: 368 10*3/uL (ref 150–450)
RBC: 3.84 x10E6/uL (ref 3.77–5.28)
RDW: 12.8 % (ref 11.7–15.4)
WBC: 6.4 10*3/uL (ref 3.4–10.8)

## 2023-07-02 NOTE — Progress Notes (Signed)
 Sodium back in normal range. GFR stable. CBC normal. Continue to hydrate and advance diet as tolerated.

## 2023-07-02 NOTE — Patient Outreach (Signed)
 Care Management  Transitions of Care Program Transitions of Care Post-discharge week 2  07/02/2023 Name: [name] MRN: [id number] DOB: May 11, 1935  Subjective: [name] is a 88 y.o. year old female who is a primary care patient of Raliegh Ip, DO. The Care Management team spoke with patient by telephone to assess and address transitions of care needs. Patient states she is just getting out of bed and this is not a good day to talk, requests call back another day.  Plan: Additional outreach attempts will be made to reach the patient enrolled in the [facility] Program (Post Inpatient/ED Visit).  Irving Shows [facility], BSN RN Care Manager/ Transition of Care [place]/ [facility] [phone]

## 2023-07-05 ENCOUNTER — Telehealth: Payer: Self-pay | Admitting: *Deleted

## 2023-07-05 NOTE — Patient Outreach (Signed)
 Care Management  Transitions of Care Program Transitions of Care Post-discharge week 2   07/05/2023 Name: [name] MRN: [id number] DOB: 10-02-1935  Subjective: [name] is a 88 y.o. year old female who is a primary care patient of Megan Ip, DO. The Care Management team Engaged with patient Engaged with patient by telephone to assess and address transitions of care needs.   Consent to Services:  Patient was given information about care management services, agreed to services, and gave verbal consent to participate.   Assessment: Patient saw primary care provider and prescribed low dose amlodipine for hypertension, pt is checking blood pressure few times per week, states " it's been much better", will try to check daily and keep a log.         SDOH Interventions    Flowsheet Row Telephone from 06/25/2023 in [facility] Office Visit from 05/13/2023 in [facility] Office Visit from 01/11/2023 in [facility] Office Visit from 12/28/2022 in [facility] Clinical Support from 08/18/2022 in [facility] Care Coordination from 11/28/2021 in [facility] Coord[facility]  SDOH Interventions        Food Insecurity Interventions Intervention Not Indicated -- -- -- Intervention Not Indicated Intervention Not Indicated  Housing Interventions Intervention Not Indicated -- -- -- Intervention Not Indicated Intervention Not Indicated  Transportation Interventions Intervention Not Indicated -- -- -- Intervention Not Indicated Intervention Not Indicated  Utilities Interventions Intervention Not Indicated -- -- -- -- --  Alcohol Usage Interventions -- -- -- -- Intervention Not Indicated (Score <7) --  Depression Interventions/Treatment  -- PHQ2-9 Score <4 Follow-up Not Indicated PHQ2-9 Score <4 Follow-up Not  Indicated PHQ2-9 Score <4 Follow-up Not Indicated -- --  Financial Strain Interventions -- -- -- -- Intervention Not Indicated Intervention Not Indicated  Physical Activity Interventions -- -- -- -- Patient Refused, Other (Comments) Patient Refused  Stress Interventions -- -- -- -- Intervention Not Indicated Intervention Not Indicated  Social Connections Interventions -- -- -- -- Intervention Not Indicated Intervention Not Indicated        Goals Addressed             This Visit's Progress    TOC Care Plan/ pt will have no readmissions within 30 days       Current Barriers:  Provider appointments pt does not have primary care provider appointment scheduled, RN Care Manager to schedule Knowledge Deficits related to plan of care for management of Influenza type A  Patient lives with spouse and adult daughter, has good support at home Patient checks blood pressure "at times" but not daily, pt had recent medication changes due to hypotension, pt reports she can "tell when blood pressure feels low", states no signs/symptoms hypotension and will keep closer check on blood pressure 07/05/23- pt saw primary care provider and restarted low dose amlodipine due to elevated blood pressure, pt states " things seem to be better now, last checked blood pressure 3/22 with good reading"  pt states she will try to be more consistent with checking blood pressure daily and keeping a log  RNCM Clinical Goal(s):  Patient will work with the Care Management team over the next 30 days to address Transition of Care Barriers: Provider appointments verbalize understanding of plan for management of Influenza type A as evidenced by patient report, review of EMR take all medications exactly as prescribed and  will call provider for medication related questions as evidenced by patient report, review of EMR  through collaboration with RN Care manager, provider, and care team.   Interventions: Evaluation of current  treatment plan related to  self management and patient's adherence to plan as established by provider  Transitions of Care:  New goal. and Goal on track:  Yes. Doctor Visits  - discussed the importance of doctor visits Reviewed Signs and symptoms of infection Reinforced with pt to check blood pressure daily and keep a log, report any low readings to primary care provider  Influenza type A  (Status:  New goal. and Goal on track:  Yes.)  Short Term Goal Evaluation of current treatment plan related to  Influenza type A ,  self-management and patient's adherence to plan as established by provider. Discussed plans with patient for ongoing care management follow up and provided patient with direct contact information for care management team Reviewed energy conservation Reviewed importance of physical activity, doing what you are able Reviewed normal parameters for blood pressure  Patient Goals/Self-Care Activities: Participate in Transition of Care Program/Attend TOC scheduled calls Notify RN Care Manager of TOC call rescheduling needs Take all medications as prescribed Attend all scheduled provider appointments Call pharmacy for medication refills 3-7 days in advance of running out of medications Call provider office for new concerns or questions  Alternate activity with rest Try to get some physical exercise daily, even if just a few minutes of walking Please check blood pressure daily and keep a log  Follow Up Plan:  Telephone follow up appointment with care management team member scheduled for:  07/13/23 @ 2 pm The patient has been provided with contact information for the care management team and has been advised to call with any health related questions or concerns.          Plan: Telephone follow up appointment with care management team member scheduled for: 07/13/23 @ 2 pm  [name] Shows [facility], BSN RN Care Manager/ Transition of Care [place]/ [facility] [phone]

## 2023-07-09 ENCOUNTER — Encounter: Payer: Self-pay | Admitting: Family Medicine

## 2023-07-09 ENCOUNTER — Ambulatory Visit: Admitting: Family Medicine

## 2023-07-09 ENCOUNTER — Ambulatory Visit (INDEPENDENT_AMBULATORY_CARE_PROVIDER_SITE_OTHER)

## 2023-07-09 ENCOUNTER — Other Ambulatory Visit: Payer: Self-pay | Admitting: Family Medicine

## 2023-07-09 VITALS — BP 125/39 | HR 63 | Temp 98.6°F | Ht 63.0 in | Wt 144.0 lb

## 2023-07-09 DIAGNOSIS — I10 Essential (primary) hypertension: Secondary | ICD-10-CM

## 2023-07-09 DIAGNOSIS — J180 Bronchopneumonia, unspecified organism: Secondary | ICD-10-CM | POA: Diagnosis not present

## 2023-07-09 DIAGNOSIS — I700 Atherosclerosis of aorta: Secondary | ICD-10-CM | POA: Diagnosis not present

## 2023-07-09 MED ORDER — METOPROLOL TARTRATE 25 MG PO TABS: 25.0000 mg | ORAL_TABLET | Freq: Two times a day (BID) | ORAL | 3 refills | Status: DC

## 2023-07-09 MED ORDER — AMLODIPINE BESYLATE 2.5 MG PO TABS: 2.5000 mg | ORAL_TABLET | Freq: Every day | ORAL | 3 refills | Status: AC

## 2023-07-09 NOTE — Progress Notes (Signed)
 Subjective: CC: Follow-up hypertension PCP: Raliegh Ip, DO HPI:[name] is a 88 y.o. female presenting to clinic today for:  1.  Hypertension Patient noted to have elevated blood pressures when she was seen at her hospital follow-up.  At discharge from the hospital she was instructed to discontinue her amlodipine, which was restarted at her hospital follow-up for elevated blood pressures.  She notes that she continues to have some intermittent high blood pressures at home with the most recent being 180 systolic.  She think she is taking her blood pressure appropriately.  She certainly compliant with her medications and denies any chest pain, shortness of breath or dizziness but she does report some generalized fatigue and does not feel like she has bounced back after her illness.  She has been taking naturopathic remedies in efforts to improve her immune system.   ROS: Per HPI  Allergies  Allergen Reactions   Prolia [Denosumab] Other (See Comments)    Arthralgia/myalgia/jaw pain/headache   Tape Rash and Other (See Comments)    Dermatitis rash "with extended exposure"   Past Medical History:  Diagnosis Date   Chronic headaches    Fibromyalgia    GERD (gastroesophageal reflux disease)    Hemorrhoids    INTERNAL--  POST BANDING 02/ 2015   History of kidney stones    Hyperlipidemia    Hypertension    Moderate aortic stenosis    NSAID long-term use 05/30/2013   OSA (obstructive sleep apnea) CPAP INTOLERANT    Osteoporosis    S/P TAVR (transcatheter aortic valve replacement) 10/19/2017   20 mm Edwards Sapien 3 transcatheter heart valve placed via percutaneous right transfemoral approach    Severe aortic stenosis    Spinal stenosis, multilevel     Current Outpatient Medications:    acetaminophen (TYLENOL) 500 MG tablet, Take 1,000 mg by mouth daily as needed for moderate pain or headache., Disp: , Rfl:    albuterol (VENTOLIN HFA) 108 (90 Base) MCG/ACT inhaler, Inhale  2 puffs into the lungs every 6 (six) hours as needed for wheezing or shortness of breath., Disp: 8 g, Rfl: 2   amLODipine (NORVASC) 2.5 MG tablet, Take 1 tablet (2.5 mg total) by mouth daily., Disp: , Rfl:    Ascorbic Acid (VITAMIN C) 1000 MG tablet, Take 1,000 mg by mouth daily., Disp: , Rfl:    aspirin EC 81 MG tablet, Take 1 tablet (81 mg total) by mouth daily., Disp: 90 tablet, Rfl: 3   benzonatate (TESSALON PERLES) 100 MG capsule, Take 1 capsule (100 mg total) by mouth 3 (three) times daily as needed for cough., Disp: 30 capsule, Rfl: 0   furosemide (LASIX) 20 MG tablet, TAKE 1 TABLET BY MOUTH EVERY DAY, Disp: 90 tablet, Rfl: 3   guaiFENesin-dextromethorphan (ROBITUSSIN DM) 100-10 MG/5ML syrup, Take 5 mLs by mouth every 4 (four) hours as needed for cough., Disp: 118 mL, Rfl: 0   Magnesium 500 MG CAPS, Take 1 capsule by mouth daily., Disp: , Rfl:    metoprolol tartrate (LOPRESSOR) 25 MG tablet, Take 1 tablet (25 mg total) by mouth 2 (two) times daily., Disp: 30 tablet, Rfl: 1   nabumetone (RELAFEN) 500 MG tablet, Take 2 tablets (1,000 mg total) by mouth 2 (two) times daily. For muscle and joint pain, Disp: 360 tablet, Rfl: 1   rosuvastatin (CRESTOR) 10 MG tablet, TAKE 1 TABLET (10 MG TOTAL) BY MOUTH DAILY, Disp: 15 tablet, Rfl: 1 Social History   Socioeconomic History   Marital status:  Married    Spouse name: Mariem Skolnick   Number of children: 6   Years of education: 6   Highest education level: 8th grade  Occupational History   Occupation: Retired  Tobacco Use   Smoking status: Former    Current packs/day: 0.00    Average packs/day: 0.3 packs/day for 5.0 years (1.3 ttl pk-yrs)    Types: Cigarettes    Start date: 04/13/1998    Quit date: 04/14/2003    Years since quitting: 20.2    Passive exposure: Past   Smokeless tobacco: Never  Vaping Use   Vaping status: Never Used  Substance and Sexual Activity   Alcohol use: Yes    Comment: once in a while-social   Drug use: Never     Comment: hemp oil    Sexual activity: Not Currently    Partners: Male  Other Topics Concern   Not on file  Social History Narrative   Not on file   Social Drivers of Health   Financial Resource Strain: Low Risk  (11/22/2022)   Overall Financial Resource Strain (CARDIA)    Difficulty of Paying Living Expenses: Not hard at all  Food Insecurity: No Food Insecurity (06/25/2023)   Hunger Vital Sign    Worried About Running Out of Food in the Last Year: Never true    Ran Out of Food in the Last Year: Never true  Transportation Needs: No Transportation Needs (06/25/2023)   PRAPARE - Administrator, Civil Service (Medical): No    Lack of Transportation (Non-Medical): No  Physical Activity: Inactive (11/22/2022)   Exercise Vital Sign    Days of Exercise per Week: 0 days    Minutes of Exercise per Session: 0 min  Stress: No Stress Concern Present (11/22/2022)   [employer] of Occupational Health - Occupational Stress Questionnaire    Feeling of Stress : Not at all  Social Connections: Moderately Integrated (06/19/2023)   Social Connection and Isolation Panel [NHANES]    Frequency of Communication with Friends and Family: More than three times a week    Frequency of Social Gatherings with Friends and Family: More than three times a week    Attends Religious Services: 1 to 4 times per year    Active Member of [employer] or Organizations: No    Attends Banker Meetings: Never    Marital Status: Married  Catering manager Violence: Not At Risk (06/25/2023)   Humiliation, Afraid, Rape, and Kick questionnaire    Fear of Current or Ex-Partner: No    Emotionally Abused: No    Physically Abused: No    Sexually Abused: No   Family History  Problem Relation Age of Onset   Cirrhosis Mother        drinking   Heart disease Sister    Other Brother        duodenal ulcer   Heart failure Maternal Grandmother    Gallbladder disease Maternal Grandmother    Stomach cancer  Paternal Grandfather    Cancer Son 87       colon    Objective: Office vital signs reviewed. BP (!) 125/39   Pulse 63   Temp 98.6 F (37 C)   Ht 5\' 3"  (1.6 m)   Wt 144 lb (65.3 kg)   SpO2 97%   BMI 25.51 kg/m   Physical Examination:  General: Awake, alert, nontoxic female, No acute distress HEENT: sclera white, MMM Cardio: regular rate and rhythm, S1S2 heard  Pulm: clear to  auscultation bilaterally, no wheezes, rhonchi or rales; normal work of breathing on room air  DG Chest 2 View Result Date: 07/09/2023 CLINICAL DATA:  Follow-up pneumonia EXAM: CHEST - 2 VIEW COMPARISON:  06/20/2023, 06/19/2023 FINDINGS: The heart size and mediastinal contours are within normal limits. Prior TAVR. Aortic atherosclerosis. Both lungs are clear. Previously seen interstitial opacities have resolved. No pleural effusion or pneumothorax. The visualized skeletal structures are unremarkable. IMPRESSION: No active cardiopulmonary disease. Previously seen interstitial opacities have resolved. Electronically Signed   By: Duanne Guess D.O.   On: 07/09/2023 11:02   Assessment/ Plan: 88 y.o. female   Essential hypertension  Bronchopneumonia - Plan: DG Chest 2 View  Blood pressure is controlled upon recheck and chest x-ray demonstrates no ongoing infiltrates suggestive of ongoing infection.  If blood pressure remains elevated going forward, we could consider either advancing her amlodipine to 5 mg versus adding back losartan.  At this time I do not think this is necessary however   Raliegh Ip, DO [facility] [phone]

## 2023-07-09 NOTE — Patient Instructions (Signed)
 How to Take Your Blood Pressure Blood pressure measures how strongly your blood is pressing against the walls of your arteries. Arteries are blood vessels that carry blood from your heart throughout your body. You can take your blood pressure at home with a machine. You may need to check your blood pressure at home: To check if you have high blood pressure (hypertension). To check your blood pressure over time. To make sure your blood pressure medicine is working. Supplies needed: Blood pressure machine, or monitor. A chair to sit in. This should be a chair where you can sit upright with your back supported. Do not sit on a soft couch or an armchair. Table or desk. Small notebook. Pencil or pen. How to prepare Avoid these things for 30 minutes before checking your blood pressure: Having drinks with caffeine in them, such as coffee or tea. Drinking alcohol. Eating. Smoking. Exercising. Do these things five minutes before checking your blood pressure: Go to the bathroom and pee (urinate). Sit in a chair. Be quiet. Do not talk. How to take your blood pressure Follow the instructions that came with your machine. If you have a digital blood pressure monitor, these may be the instructions: Sit up straight. Place your feet on the floor. Do not cross your ankles or legs. Rest your left arm at the level of your heart. You may rest it on a table, desk, or chair. Pull up your shirt sleeve. Wrap the blood pressure cuff around the upper part of your left arm. The cuff should be 1 inch (2.5 cm) above your elbow. It is best to wrap the cuff around bare skin. Fit the cuff snugly around your arm, but not too tightly. You should be able to place only one finger between the cuff and your arm. Place the cord so that it rests in the bend of your elbow. Press the power button. Sit quietly while the cuff fills with air and loses air. Write down the numbers on the screen. Wait 2-3 minutes and then repeat  steps 1-10. What do the numbers mean? Two numbers make up your blood pressure. The first number is called systolic pressure. The second is called diastolic pressure. An example of a blood pressure reading is "120 over 80" (or 120/80). If you are an adult and do not have a medical condition, use this guide to find out if your blood pressure is normal: Normal First number: below 120. Second number: below 80. Elevated First number: 120-129. Second number: below 80. Hypertension stage 1 First number: 130-139. Second number: 80-89. Hypertension stage 2 First number: 140 or above. Second number: 90 or above. Your blood pressure is above normal even if only the first or only the second number is above normal. Follow these instructions at home: Medicines Take over-the-counter and prescription medicines only as told by your doctor. Tell your doctor if your medicine is causing side effects. General instructions Check your blood pressure as often as your doctor tells you to. Check your blood pressure at the same time every day. Take your monitor to your next doctor's appointment. Your doctor will: Make sure you are using it correctly. Make sure it is working right. Understand what your blood pressure numbers should be. Keep all follow-up visits. General tips You will need a blood pressure machine or monitor. Your doctor can suggest a monitor. You can buy one at a drugstore or online. When choosing one: Choose one with an arm cuff. Choose one that wraps around your  upper arm. Only one finger should fit between your arm and the cuff. Do not choose one that measures your blood pressure from your wrist or finger. Where to find more information American Heart Association: www.heart.org Contact a doctor if: Your blood pressure keeps being high. Your blood pressure is suddenly low. Get help right away if: Your first blood pressure number is higher than 180. Your second blood pressure number is  higher than 120. These symptoms may be an emergency. Do not wait to see if the symptoms will go away. Get help right away. Call 911. Summary Check your blood pressure at the same time every day. Avoid caffeine, alcohol, smoking, and exercise for 30 minutes before checking your blood pressure. Make sure you understand what your blood pressure numbers should be. This information is not intended to replace advice given to you by your health care provider. Make sure you discuss any questions you have with your health care provider. Document Revised: 12/12/2020 Document Reviewed: 12/12/2020 Elsevier Patient Education  2024 [employer].

## 2023-07-13 ENCOUNTER — Other Ambulatory Visit: Payer: Self-pay | Admitting: *Deleted

## 2023-07-13 ENCOUNTER — Encounter: Payer: Self-pay | Admitting: *Deleted

## 2023-07-13 NOTE — Patient Outreach (Signed)
 Care Management  Transitions of Care Program Transitions of Care Post-discharge week 3   07/13/2023 Name: [name] MRN: [id number] DOB: 1936/02/14  Subjective: [name] is a 88 y.o. year old female who is a primary care patient of Raliegh Ip, DO. The Care Management team Engaged with patient Engaged with patient by telephone to assess and address transitions of care needs.   Consent to Services:  Patient was given information about care management services, agreed to services, and gave verbal consent to participate.   Assessment: Patient continues checking blood pressure and heart rate several times per week, reports overall doing well, no new concerns reported.         SDOH Interventions    Flowsheet Row Telephone from 06/25/2023 in [facility] Office Visit from 05/13/2023 in [facility] Office Visit from 01/11/2023 in [facility] Office Visit from 12/28/2022 in [facility] Clinical Support from 08/18/2022 in [facility] Care Coordination from 11/28/2021 in [facility] Coord[facility]  SDOH Interventions        Food Insecurity Interventions Intervention Not Indicated -- -- -- Intervention Not Indicated Intervention Not Indicated  Housing Interventions Intervention Not Indicated -- -- -- Intervention Not Indicated Intervention Not Indicated  Transportation Interventions Intervention Not Indicated -- -- -- Intervention Not Indicated Intervention Not Indicated  Utilities Interventions Intervention Not Indicated -- -- -- -- --  Alcohol Usage Interventions -- -- -- -- Intervention Not Indicated (Score <7) --  Depression Interventions/Treatment  -- PHQ2-9 Score <4 Follow-up Not Indicated PHQ2-9 Score <4 Follow-up Not Indicated PHQ2-9 Score <4 Follow-up Not Indicated -- --  Financial Strain  Interventions -- -- -- -- Intervention Not Indicated Intervention Not Indicated  Physical Activity Interventions -- -- -- -- Patient Refused, Other (Comments) Patient Refused  Stress Interventions -- -- -- -- Intervention Not Indicated Intervention Not Indicated  Social Connections Interventions -- -- -- -- Intervention Not Indicated Intervention Not Indicated        Goals Addressed             This Visit's Progress    TOC Care Plan/ pt will have no readmissions within 30 days       Current Barriers:  Provider appointments pt does not have primary care provider appointment scheduled, RN Care Manager to schedule Knowledge Deficits related to plan of care for management of Influenza type A  Patient lives with spouse and adult daughter, has good support at home Patient checks blood pressure "at times" but not daily, pt had recent medication changes due to hypotension, pt reports she can "tell when blood pressure feels low", states no signs/symptoms hypotension and will keep closer check on blood pressure 07/05/23- pt saw primary care provider and restarted low dose amlodipine due to elevated blood pressure, pt states " things seem to be better now, last checked blood pressure 3/22 with good reading"  pt states she will try to be more consistent with checking blood pressure daily and keeping a log 07/13/23- Patient reports she is overall doing well, still tired at times, continues checking blood pressure and heart rate and keeping a log, recent blood pressure readings 159/68, 157/61, heart rate 67, 89 Patient saw primary care provider on 3/28, reported chest xray is clear  RNCM Clinical Goal(s):  Patient will work with the Care Management team over the next 30 days to address Transition of  Care Barriers: Provider appointments verbalize understanding of plan for management of Influenza type A as evidenced by patient report, review of EMR take all medications exactly as prescribed and will call  provider for medication related questions as evidenced by patient report, review of EMR  through collaboration with RN Care manager, provider, and care team.   Interventions: Evaluation of current treatment plan related to  self management and patient's adherence to plan as established by provider  Transitions of Care:  New goal. and Goal on track:  Yes. Doctor Visits  - discussed the importance of doctor visits Reviewed Signs and symptoms of infection Reviewed with pt to check blood pressure daily and keep a log, report any low readings to primary care provider  Influenza type A  (Status:  New goal. and Goal on track:  Yes.)  Short Term Goal Evaluation of current treatment plan related to  Influenza type A ,  self-management and patient's adherence to plan as established by provider. Discussed plans with patient for ongoing care management follow up and provided patient with direct contact information for care management team Reviewed energy conservation Reviewed importance of physical activity, doing what you are able Reinforced normal parameters for blood pressure Reviewed safety precautions with patient  Patient Goals/Self-Care Activities: Participate in Transition of Care Program/Attend [facility] scheduled calls Notify RN Care Manager of TOC call rescheduling needs Take all medications as prescribed Attend all scheduled provider appointments Call pharmacy for medication refills 3-7 days in advance of running out of medications Call provider office for new concerns or questions  Alternate activity with rest Try to get some physical exercise daily, even if just a few minutes of walking Please continue to check blood pressure daily and keep a log  Follow Up Plan:  Telephone follow up appointment with care management team member scheduled for:  07/20/23 @ 115 pm The patient has been provided with contact information for the care management team and has been advised to call with any health  related questions or concerns.          Plan: Telephone follow up appointment with care management team member scheduled for: 07/20/23 @ 115 pm  Irving Shows [facility], BSN RN Care Manager/ Transition of Care [place]/ [facility] [phone]

## 2023-07-13 NOTE — Patient Instructions (Signed)
 Visit Information  Thank you for taking time to visit with me today. Please don't hesitate to contact me if I can be of assistance to you before our next scheduled telephone appointment.  Following are the goals we discussed today:   Goals Addressed             This Visit's Progress    TOC Care Plan/ pt will have no readmissions within 30 days       Current Barriers:  Provider appointments pt does not have primary care provider appointment scheduled, RN Care Manager to schedule Knowledge Deficits related to plan of care for management of Influenza type A  Patient lives with spouse and adult daughter, has good support at home Patient checks blood pressure "at times" but not daily, pt had recent medication changes due to hypotension, pt reports she can "tell when blood pressure feels low", states no signs/symptoms hypotension and will keep closer check on blood pressure 07/05/23- pt saw primary care provider and restarted low dose amlodipine due to elevated blood pressure, pt states " things seem to be better now, last checked blood pressure 3/22 with good reading"  pt states she will try to be more consistent with checking blood pressure daily and keeping a log 07/13/23- Patient reports she is overall doing well, still tired at times, continues checking blood pressure and heart rate and keeping a log, recent blood pressure readings 159/68, 157/61, heart rate 67, 89 Patient saw primary care provider on 3/28, reported chest xray is clear  RNCM Clinical Goal(s):  Patient will work with the Care Management team over the next 30 days to address Transition of Care Barriers: Provider appointments verbalize understanding of plan for management of Influenza type A as evidenced by patient report, review of EMR take all medications exactly as prescribed and will call provider for medication related questions as evidenced by patient report, review of EMR  through collaboration with RN Care manager,  provider, and care team.   Interventions: Evaluation of current treatment plan related to  self management and patient's adherence to plan as established by provider  Transitions of Care:  New goal. and Goal on track:  Yes. Doctor Visits  - discussed the importance of doctor visits Reviewed Signs and symptoms of infection Reviewed with pt to check blood pressure daily and keep a log, report any low readings to primary care provider  Influenza type A  (Status:  New goal. and Goal on track:  Yes.)  Short Term Goal Evaluation of current treatment plan related to  Influenza type A ,  self-management and patient's adherence to plan as established by provider. Discussed plans with patient for ongoing care management follow up and provided patient with direct contact information for care management team Reviewed energy conservation Reviewed importance of physical activity, doing what you are able Reinforced normal parameters for blood pressure Reviewed safety precautions with patient  Patient Goals/Self-Care Activities: Participate in Transition of Care Program/Attend [facility] scheduled calls Notify RN Care Manager of TOC call rescheduling needs Take all medications as prescribed Attend all scheduled provider appointments Call pharmacy for medication refills 3-7 days in advance of running out of medications Call provider office for new concerns or questions  Alternate activity with rest Try to get some physical exercise daily, even if just a few minutes of walking Please continue to check blood pressure daily and keep a log  Follow Up Plan:  Telephone follow up appointment with care management team member scheduled for:  07/20/23 @  115 pm The patient has been provided with contact information for the care management team and has been advised to call with any health related questions or concerns.           Our next appointment is by telephone on 07/20/23 at 115 pm  Please call the care guide  team at [phone] if you need to cancel or reschedule your appointment.   If you are experiencing a Mental Health or Behavioral Health Crisis or need someone to talk to, please call the Suicide and Crisis Lifeline: 988 call the [place] National Suicide Prevention Lifeline: 212-148-5232 or TTY: (215)373-3569 TTY 701 315 2098) to talk to a trained counselor call 1-800-273-TALK (toll free, 24 hour hotline) go to [facility] [address] [place] [phone]) call the [facility] Crisis Line: [phone] call 911   Patient verbalizes understanding of instructions and care plan provided today and agrees to view in MyChart. Active MyChart status and patient understanding of how to access instructions and care plan via MyChart confirmed with patient.     Irving Shows [facility], BSN RN Care Manager/ Transition of Care [place]/ [facility] [phone]

## 2023-07-20 ENCOUNTER — Other Ambulatory Visit: Payer: Self-pay | Admitting: *Deleted

## 2023-07-20 NOTE — Patient Outreach (Signed)
  Transition of Care week 4  Visit Note  07/20/2023  Name: [name] MRN: [id number]          DOB: [birth year]  Situation: Patient enrolled in [facility] 30-day program. Visit completed with patient by telephone.   Background: Patient has recovered well from Flu type A, continues checking blood pressure, heart rate and keeping a log, no new concerns reported today.  Patient does not feel she needs or wants further follow up.     Past Medical History:  Diagnosis Date   Chronic headaches    Fibromyalgia    GERD (gastroesophageal reflux disease)    Hemorrhoids    INTERNAL--  POST BANDING 02/ 2015   History of kidney stones    Hyperlipidemia    Hypertension    Moderate aortic stenosis    NSAID long-term use 05/30/2013   OSA (obstructive sleep apnea) CPAP INTOLERANT    Osteoporosis    S/P TAVR (transcatheter aortic valve replacement) 10/19/2017   20 mm Edwards Sapien 3 transcatheter heart valve placed via percutaneous right transfemoral approach    Severe aortic stenosis    Spinal stenosis, multilevel     Assessment: Patient Reported Symptoms:  Cognitive Alert, oriented  Neurological No symptoms reported    HEENT No symptoms reported    Cardiovascular No symptoms reported    Respiratory No symptoms reported    Endocrine No symptoms reported    Gastrointestinal Constipation (on occasion, maybe twice per month, states " not bothersome")    Genitourinary No symptoms reported    Integumentary No symptoms reported    Musculoskeletal No symptoms reported    Psychosocial No symptoms reported     Vitals:    Medications Reviewed Today   Medications were not reviewed in this encounter     Recommendation:   PCP Follow-up 08/10/23 @ 205 pm  Follow Up Plan:   Patient has met all care management goals. Care Management case will be closed. Patient has been provided contact information should new needs arise.   Irving Shows [facility], BSN RN Care Manager/ Transition of Care [facility]/ [facility] [phone]

## 2023-07-20 NOTE — Patient Instructions (Signed)
 Visit Information  Thank you for taking time to visit with me today. Please don't hesitate to contact me if I can be of assistance to you before our next scheduled telephone appointment.   Following is a copy of your care plan:   Goals Addressed             This Visit's Progress    COMPLETED: TOC Care Plan/ pt will have no readmissions within 30 days       Current Barriers:  Provider appointments pt does not have primary care provider appointment scheduled, RN Care Manager to schedule Knowledge Deficits related to plan of care for management of Influenza type A  Patient lives with spouse and adult daughter, has good support at home Patient checks blood pressure "at times" but not daily, pt had recent medication changes due to hypotension, pt reports she can "tell when blood pressure feels low", states no signs/symptoms hypotension and will keep closer check on blood pressure 07/05/23- pt saw primary care provider and restarted low dose amlodipine due to elevated blood pressure, pt states " things seem to be better now, last checked blood pressure 3/22 with good reading"  pt states she will try to be more consistent with checking blood pressure daily and keeping a log 07/13/23- Patient reports she is overall doing well, still tired at times, continues checking blood pressure and heart rate and keeping a log, recent blood pressure readings 159/68, 157/61, heart rate 67, 89 Patient saw primary care provider on 3/28, reported chest xray is clear 07/20/23- Patient reports she is doing well, continues checking blood pressure, heart rate and keeping a log, does not have log in front of her, no new concerns reported  RNCM Clinical Goal(s):  Patient will work with the Care Management team over the next 30 days to address Transition of Care Barriers: Provider appointments verbalize understanding of plan for management of Influenza type A as evidenced by patient report, review of EMR take all medications  exactly as prescribed and will call provider for medication related questions as evidenced by patient report, review of EMR  through collaboration with RN Care manager, provider, and care team.   Interventions: Evaluation of current treatment plan related to  self management and patient's adherence to plan as established by provider  Transitions of Care:  New goal. and Goal on track:  Yes. Doctor Visits  - discussed the importance of doctor visits Reviewed Signs and symptoms of infection Reviewed with pt to check blood pressure daily and keep a log, report any low readings to primary care provider  Influenza type A  (Status:  Goal Met. and Patient declined further engagement on this goal.)  Short Term Goal Evaluation of current treatment plan related to  Influenza type A ,  self-management and patient's adherence to plan as established by provider. Discussed plans with patient for ongoing care management follow up and provided patient with direct contact information for care management team Reinforced energy conservation Reinforced importance of physical activity, doing what you are able Reinforced normal parameters for blood pressure, importance of keeping a log and taking log to MD appointments Reinforced safety precautions with patient Reviewed plan of care including case closure, pt does not feel she needs or wants further follow up Reviewed upcoming scheduled appointments including primary care provider 08/10/23 @ 205 pm  Patient Goals/Self-Care Activities: Participate in Transition of Care Program/Attend [facility] scheduled calls Notify RN Care Manager of TOC call rescheduling needs Take all medications as prescribed Attend  all scheduled provider appointments Call pharmacy for medication refills 3-7 days in advance of running out of medications Call provider office for new concerns or questions  Alternate activity with rest Try to get some physical exercise daily, even if just a few  minutes of walking Please continue to check blood pressure daily and keep a log, take log to doctor's appointments Follow up with primary care provider on 08/10/23 @ 205 pm Case closure   Follow Up Plan:  No further follow up required: case closure          Patient verbalizes understanding of instructions and care plan provided today and agrees to view in MyChart. Active MyChart status and patient understanding of how to access instructions and care plan via MyChart confirmed with patient.     The patient has been provided with contact information for the care management team and has been advised to call with any health related questions or concerns.   Please call the care guide team at [phone] if you need to cancel or reschedule your appointment.   Please call the Suicide and Crisis Lifeline: 988 call the [place] National Suicide Prevention Lifeline: 417-378-6595 or TTY: 772-737-1809 TTY (862) 067-8301) to talk to a trained counselor call 1-800-273-TALK (toll free, 24 hour hotline) go to [facility] [address] [place] [phone]) call the [place] Crisis Line: 7272803611 call 911 if you are experiencing a Mental Health or Behavioral Health Crisis or need someone to talk to  Irving Shows [facility], BSN RN Care Manager/ Transition of Care [place]/ [facility] [phone]

## 2023-07-22 ENCOUNTER — Other Ambulatory Visit (HOSPITAL_BASED_OUTPATIENT_CLINIC_OR_DEPARTMENT_OTHER): Payer: Self-pay | Admitting: Cardiovascular Disease

## 2023-07-26 ENCOUNTER — Other Ambulatory Visit: Payer: Self-pay | Admitting: Family Medicine

## 2023-08-10 ENCOUNTER — Ambulatory Visit (INDEPENDENT_AMBULATORY_CARE_PROVIDER_SITE_OTHER): Admitting: Family Medicine

## 2023-08-10 ENCOUNTER — Encounter: Payer: Self-pay | Admitting: Family Medicine

## 2023-08-10 VITALS — BP 133/60 | HR 67 | Temp 97.9°F | Ht 63.0 in | Wt 143.6 lb

## 2023-08-10 DIAGNOSIS — M130 Polyarthritis, unspecified: Secondary | ICD-10-CM | POA: Diagnosis not present

## 2023-08-10 DIAGNOSIS — Z6379 Other stressful life events affecting family and household: Secondary | ICD-10-CM

## 2023-08-10 DIAGNOSIS — I10 Essential (primary) hypertension: Secondary | ICD-10-CM | POA: Diagnosis not present

## 2023-08-10 DIAGNOSIS — G894 Chronic pain syndrome: Secondary | ICD-10-CM | POA: Diagnosis not present

## 2023-08-10 MED ORDER — TRAMADOL HCL 50 MG PO TABS: 50.0000 mg | ORAL_TABLET | Freq: Two times a day (BID) | ORAL | 5 refills | Status: DC | PRN

## 2023-08-10 NOTE — Progress Notes (Signed)
 Subjective: CC: Follow-up hypertension PCP: Eliodoro Guerin, DO HPI:[name] is a 88 y.o. female presenting to clinic today for:  1.  Hypertension Patient is compliant with Norvasc  2.5 mg daily, Lopressor  25 mg twice daily.  She reports no chest pain, shortness of breath outside of baseline.  She has been under more stress because her husband was just diagnosed with cancer and they are trying to determine how spreadout it is.  He just had a PET scan done yesterday and they are awaiting results  She does report some increased chronic pain that is not relieved by her home nabumetone .  She has pain in her shoulders, knees.  She has been sitting a lot more at his bedside going through all these different tests for the cancer and has noticed increasing pain.  Asking to be placed back on tramadol  as needed.   ROS: Per HPI  Allergies  Allergen Reactions   Prolia  [Denosumab ] Other (See Comments)    Arthralgia/myalgia/jaw pain/headache   Tape Rash and Other (See Comments)    Dermatitis rash "with extended exposure"   Past Medical History:  Diagnosis Date   Chronic headaches    Fibromyalgia    GERD (gastroesophageal reflux disease)    Hemorrhoids    INTERNAL--  POST BANDING 02/ 2015   History of kidney stones    Hyperlipidemia    Hypertension    Moderate aortic stenosis    NSAID long-term use 05/30/2013   OSA (obstructive sleep apnea) CPAP INTOLERANT    Osteoporosis    S/P TAVR (transcatheter aortic valve replacement) 10/19/2017   20 mm Edwards Sapien 3 transcatheter heart valve placed via percutaneous right transfemoral approach    Severe aortic stenosis    Spinal stenosis, multilevel     Current Outpatient Medications:    acetaminophen  (TYLENOL ) 500 MG tablet, Take 1,000 mg by mouth daily as needed for moderate pain or headache., Disp: , Rfl:    albuterol  (VENTOLIN  HFA) 108 (90 Base) MCG/ACT inhaler, Inhale 2 puffs into the lungs every 6 (six) hours as needed for wheezing  or shortness of breath., Disp: 8 g, Rfl: 2   amLODipine  (NORVASC ) 2.5 MG tablet, Take 1 tablet (2.5 mg total) by mouth daily., Disp: 90 tablet, Rfl: 3   Ascorbic Acid (VITAMIN C) 1000 MG tablet, Take 1,000 mg by mouth daily., Disp: , Rfl:    aspirin  EC 81 MG tablet, Take 1 tablet (81 mg total) by mouth daily., Disp: 90 tablet, Rfl: 3   furosemide  (LASIX ) 20 MG tablet, TAKE 1 TABLET BY MOUTH EVERY DAY, Disp: 90 tablet, Rfl: 3   Magnesium  500 MG CAPS, Take 1 capsule by mouth daily., Disp: , Rfl:    metoprolol  tartrate (LOPRESSOR ) 25 MG tablet, Take 1 tablet (25 mg total) by mouth 2 (two) times daily., Disp: 180 tablet, Rfl: 3   nabumetone  (RELAFEN ) 500 MG tablet, TAKE 2 TABLETS (1,000 MG TOTAL) BY MOUTH 2 (TWO) TIMES DAILY. FOR MUSCLE AND JOINT PAIN, Disp: 360 tablet, Rfl: 0   rosuvastatin  (CRESTOR ) 10 MG tablet, TAKE 1 TABLET (10 MG TOTAL) BY MOUTH DAILY, Disp: 15 tablet, Rfl: 1 Social History   Socioeconomic History   Marital status: Married    Spouse name: Macaila Dilorenzo   Number of children: 6   Years of education: 6   Highest education level: 8th grade  Occupational History   Occupation: Retired  Tobacco Use   Smoking status: Former    Current packs/day: 0.00    Average  packs/day: 0.3 packs/day for 5.0 years (1.3 ttl pk-yrs)    Types: Cigarettes    Start date: 04/13/1998    Quit date: 04/14/2003    Years since quitting: 20.3    Passive exposure: Past   Smokeless tobacco: Never  Vaping Use   Vaping status: Never Used  Substance and Sexual Activity   Alcohol  use: Yes    Comment: once in a while-social   Drug use: Never    Comment: hemp oil    Sexual activity: Not Currently    Partners: Male  Other Topics Concern   Not on file  Social History Narrative   Not on file   Social Drivers of Health   Financial Resource Strain: Low Risk  (11/22/2022)   Overall Financial Resource Strain (CARDIA)    Difficulty of Paying Living Expenses: Not hard at all  Food Insecurity: No Food  Insecurity (06/25/2023)   Hunger Vital Sign    Worried About Running Out of Food in the Last Year: Never true    Ran Out of Food in the Last Year: Never true  Transportation Needs: No Transportation Needs (06/25/2023)   PRAPARE - Administrator, Civil Service (Medical): No    Lack of Transportation (Non-Medical): No  Physical Activity: Inactive (11/22/2022)   Exercise Vital Sign    Days of Exercise per Week: 0 days    Minutes of Exercise per Session: 0 min  Stress: No Stress Concern Present (11/22/2022)   [employer] of Occupational Health - Occupational Stress Questionnaire    Feeling of Stress : Not at all  Social Connections: Moderately Integrated (06/19/2023)   Social Connection and Isolation Panel [NHANES]    Frequency of Communication with Friends and Family: More than three times a week    Frequency of Social Gatherings with Friends and Family: More than three times a week    Attends Religious Services: 1 to 4 times per year    Active Member of [employer] or Organizations: No    Attends Banker Meetings: Never    Marital Status: Married  Catering manager Violence: Not At Risk (06/25/2023)   Humiliation, Afraid, Rape, and Kick questionnaire    Fear of Current or Ex-Partner: No    Emotionally Abused: No    Physically Abused: No    Sexually Abused: No   Family History  Problem Relation Age of Onset   Cirrhosis Mother        drinking   Heart disease Sister    Other Brother        duodenal ulcer   Heart failure Maternal Grandmother    Gallbladder disease Maternal Grandmother    Stomach cancer Paternal Grandfather    Cancer Son 51       colon    Objective: Office vital signs reviewed. BP 133/60   Pulse 67   Temp 97.9 F (36.6 C)   Ht 5\' 3"  (1.6 m)   Wt 143 lb 9.6 oz (65.1 kg)   SpO2 98%   BMI 25.44 kg/m   Physical Examination:  General: Awake, alert, No acute distress HEENT: Sclera white. Cardio: regular rate and rhythm  MSK:  Osteoarthritic changes noted throughout the hands.  She is ambulating independently with slow and minimally antalgic gait     08/10/2023    2:09 PM 07/09/2023    9:13 AM 05/13/2023   10:43 AM  Depression screen PHQ 2/9  Decreased Interest 0 0 0  Down, Depressed, Hopeless 0 0 0  PHQ -  2 Score 0 0 0  Altered sleeping 0 0 1  Tired, decreased energy 0 0 1  Change in appetite 0 0 0  Feeling bad or failure about yourself  0 0 0  Trouble concentrating 0 0 0  Moving slowly or fidgety/restless 0 0 0  Suicidal thoughts 0 0 0  PHQ-9 Score 0 0 2  Difficult doing work/chores Not difficult at all Not difficult at all Somewhat difficult      08/10/2023    2:09 PM 07/20/2023    1:28 PM 07/09/2023    9:13 AM 05/13/2023   10:43 AM  GAD 7 : Generalized Anxiety Score  Nervous, Anxious, on Edge 0 0 0 0  Control/stop worrying 0 0 0 0  Worry too much - different things 0 0 0 0  Trouble relaxing 0 0 0 1  Restless 0 0 0 0  Easily annoyed or irritable 0 0 0 1  Afraid - awful might happen 0 0 0 0  Total GAD 7 Score 0 0 0 2  Anxiety Difficulty  Not difficult at all        Assessment/ Plan: 88 y.o. female   Essential hypertension  Chronic pain syndrome - Plan: traMADol  (ULTRAM ) 50 MG tablet, ToxASSURE Select 13 (MW), Urine  Polyarthritis - Plan: traMADol  (ULTRAM ) 50 MG tablet, ToxASSURE Select 13 (MW), Urine  Stress due to illness of family member  Blood pressure controlled and stable  Tramadol  renewed.  UDS and CSA are up-to-date as per office policy and the national narcotic database reviewed with no red flags.  Caution sedation.  No driving.  Advised to have a bowel regimen.  Would like to see her back again in 3 months just to check on her mood.  I am sure she is very stressed right now given her worry over her husband's recent diagnosis   Eliodoro Guerin, DO [facility] [phone]

## 2023-08-13 LAB — TOXASSURE SELECT 13 (MW), URINE

## 2023-08-19 ENCOUNTER — Encounter

## 2023-09-03 ENCOUNTER — Telehealth (HOSPITAL_BASED_OUTPATIENT_CLINIC_OR_DEPARTMENT_OTHER): Payer: Self-pay | Admitting: Cardiovascular Disease

## 2023-09-03 MED ORDER — ROSUVASTATIN CALCIUM 10 MG PO TABS: 10.0000 mg | ORAL_TABLET | Freq: Every day | ORAL | 2 refills | Status: AC

## 2023-09-03 NOTE — Telephone Encounter (Signed)
*  STAT* If patient is at the pharmacy, call can be transferred to refill team.   1. Which medications need to be refilled? (please list name of each medication and dose if known)   rosuvastatin  (CRESTOR ) 10 MG tablet   2. Would you like to learn more about the convenience, safety, & potential cost savings by using the [facility]?   3. Are you open to using the [facility] (Type [facility]. ).  4. Which pharmacy/location (including street and city if local pharmacy) is medication to be sent to?  [id number] - [place], [place] - [address]   5. Do they need a 30 day or 90 day supply?   90 day  Caller (Reena) stated patient is completely out of this medication.

## 2023-09-03 NOTE — Telephone Encounter (Signed)
 Pt's medication was sent to pt's pharmacy as requested. Confirmation received.

## 2023-09-15 ENCOUNTER — Telehealth: Payer: Self-pay | Admitting: Pharmacist

## 2023-09-15 NOTE — Telephone Encounter (Signed)
   This patient is appearing on a report for being at risk of failing the adherence measure for cholesterol (statin) medications this calendar year.   Medication: rosuvastatin  10mg  Last fill date: 09/03/23 for 90 day supply  Insurance report was not up to date. No action needed at this time.  and 2 refills were sent with original RX   No f/u needed at this time  Marvell Slider, PharmD, BCACP, CPP Clinical Pharmacist, [facility]

## 2023-09-23 DIAGNOSIS — B351 Tinea unguium: Secondary | ICD-10-CM | POA: Diagnosis not present

## 2023-09-23 DIAGNOSIS — M79676 Pain in unspecified toe(s): Secondary | ICD-10-CM | POA: Diagnosis not present

## 2023-10-29 ENCOUNTER — Ambulatory Visit

## 2023-11-10 ENCOUNTER — Encounter: Payer: Self-pay | Admitting: Family Medicine

## 2023-11-10 ENCOUNTER — Ambulatory Visit (INDEPENDENT_AMBULATORY_CARE_PROVIDER_SITE_OTHER): Admitting: Family Medicine

## 2023-11-10 VITALS — BP 140/45 | HR 64 | Temp 97.6°F | Ht 63.0 in | Wt 150.0 lb

## 2023-11-10 DIAGNOSIS — Z6379 Other stressful life events affecting family and household: Secondary | ICD-10-CM

## 2023-11-10 DIAGNOSIS — M130 Polyarthritis, unspecified: Secondary | ICD-10-CM

## 2023-11-10 DIAGNOSIS — G894 Chronic pain syndrome: Secondary | ICD-10-CM | POA: Diagnosis not present

## 2023-11-10 MED ORDER — TRAMADOL HCL 50 MG PO TABS: 50.0000 mg | ORAL_TABLET | Freq: Two times a day (BID) | ORAL | 5 refills | Status: DC | PRN

## 2023-11-10 NOTE — Progress Notes (Unsigned)
 Subjective: CC:*** PCP: Jolinda Norene HERO, DO HPI:[name] is a 88 y.o. female presenting to clinic today for:  1. ***   ROS: Per HPI  Allergies  Allergen Reactions   Prolia  [Denosumab ] Other (See Comments)    Arthralgia/myalgia/jaw pain/headache   Tape Rash and Other (See Comments)    Dermatitis rash with extended exposure   Past Medical History:  Diagnosis Date   Chronic headaches    Fibromyalgia    GERD (gastroesophageal reflux disease)    Hemorrhoids    INTERNAL--  POST BANDING 02/ 2015   History of kidney stones    Hyperlipidemia    Hypertension    Moderate aortic stenosis    NSAID long-term use 05/30/2013   OSA (obstructive sleep apnea) CPAP INTOLERANT    Osteoporosis    S/P TAVR (transcatheter aortic valve replacement) 10/19/2017   20 mm Edwards Sapien 3 transcatheter heart valve placed via percutaneous right transfemoral approach    Severe aortic stenosis    Spinal stenosis, multilevel     Current Outpatient Medications:    acetaminophen  (TYLENOL ) 500 MG tablet, Take 1,000 mg by mouth daily as needed for moderate pain or headache., Disp: , Rfl:    albuterol  (VENTOLIN  HFA) 108 (90 Base) MCG/ACT inhaler, Inhale 2 puffs into the lungs every 6 (six) hours as needed for wheezing or shortness of breath., Disp: 8 g, Rfl: 2   amLODipine  (NORVASC ) 2.5 MG tablet, Take 1 tablet (2.5 mg total) by mouth daily., Disp: 90 tablet, Rfl: 3   Ascorbic Acid (VITAMIN C) 1000 MG tablet, Take 1,000 mg by mouth daily., Disp: , Rfl:    aspirin  EC 81 MG tablet, Take 1 tablet (81 mg total) by mouth daily., Disp: 90 tablet, Rfl: 3   furosemide  (LASIX ) 20 MG tablet, TAKE 1 TABLET BY MOUTH EVERY DAY, Disp: 90 tablet, Rfl: 3   Magnesium  500 MG CAPS, Take 1 capsule by mouth daily., Disp: , Rfl:    metoprolol  tartrate (LOPRESSOR ) 25 MG tablet, Take 1 tablet (25 mg total) by mouth 2 (two) times daily., Disp: 180 tablet, Rfl: 3   nabumetone  (RELAFEN ) 500 MG tablet, TAKE 2 TABLETS (1,000  MG TOTAL) BY MOUTH 2 (TWO) TIMES DAILY. FOR MUSCLE AND JOINT PAIN, Disp: 360 tablet, Rfl: 0   rosuvastatin  (CRESTOR ) 10 MG tablet, Take 1 tablet (10 mg total) by mouth daily., Disp: 90 tablet, Rfl: 2   traMADol  (ULTRAM ) 50 MG tablet, Take 1 tablet (50 mg total) by mouth every 12 (twelve) hours as needed for severe pain (pain score 7-10)., Disp: 60 tablet, Rfl: 5 Social History   Socioeconomic History   Marital status: Married    Spouse name: Shanik Brookshire   Number of children: 6   Years of education: 6   Highest education level: 8th grade  Occupational History   Occupation: Retired  Tobacco Use   Smoking status: Former    Current packs/day: 0.00    Average packs/day: 0.3 packs/day for 5.0 years (1.3 ttl pk-yrs)    Types: Cigarettes    Start date: 04/13/1998    Quit date: 04/14/2003    Years since quitting: 20.5    Passive exposure: Past   Smokeless tobacco: Never  Vaping Use   Vaping status: Never Used  Substance and Sexual Activity   Alcohol  use: Yes    Comment: once in a while-social   Drug use: Never    Comment: hemp oil    Sexual activity: Not Currently    Partners: Male  Other Topics Concern   Not on file  Social History Narrative   Not on file   Social Drivers of Health   Financial Resource Strain: Low Risk  (11/22/2022)   Overall Financial Resource Strain (CARDIA)    Difficulty of Paying Living Expenses: Not hard at all  Food Insecurity: No Food Insecurity (06/25/2023)   Hunger Vital Sign    Worried About Running Out of Food in the Last Year: Never true    Ran Out of Food in the Last Year: Never true  Transportation Needs: No Transportation Needs (06/25/2023)   PRAPARE - Administrator, Civil Service (Medical): No    Lack of Transportation (Non-Medical): No  Physical Activity: Unknown (11/22/2022)   Exercise Vital Sign    Days of Exercise per Week: 0 days    Minutes of Exercise per Session: Not on file  Recent Concern: Physical Activity - Inactive  (11/22/2022)   Exercise Vital Sign    Days of Exercise per Week: 0 days    Minutes of Exercise per Session: 0 min  Stress: No Stress Concern Present (11/22/2022)   [employer] of Occupational Health - Occupational Stress Questionnaire    Feeling of Stress : Not at all  Social Connections: Moderately Integrated (06/19/2023)   Social Connection and Isolation Panel    Frequency of Communication with Friends and Family: More than three times a week    Frequency of Social Gatherings with Friends and Family: More than three times a week    Attends Religious Services: 1 to 4 times per year    Active Member of [employer] or Organizations: No    Attends Banker Meetings: Never    Marital Status: Married  Catering manager Violence: Not At Risk (06/25/2023)   Humiliation, Afraid, Rape, and Kick questionnaire    Fear of Current or Ex-Partner: No    Emotionally Abused: No    Physically Abused: No    Sexually Abused: No   Family History  Problem Relation Age of Onset   Cirrhosis Mother        drinking   Heart disease Sister    Other Brother        duodenal ulcer   Heart failure Maternal Grandmother    Gallbladder disease Maternal Grandmother    Stomach cancer Paternal Grandfather    Cancer Son 7       colon    Objective: Office vital signs reviewed. There were no vitals taken for this visit.  Physical Examination:  General: Awake, alert, *** nourished, No acute distress HEENT: Normal    Neck: No masses palpated. No lymphadenopathy    Ears: Tympanic membranes intact, normal light reflex, no erythema, no bulging    Eyes: PERRLA, extraocular membranes intact, sclera ***    Nose: nasal turbinates moist, *** nasal discharge    Throat: moist mucus membranes, no erythema, *** tonsillar exudate.  Airway is patent Cardio: regular rate and rhythm, S1S2 heard, no murmurs appreciated Pulm: clear to auscultation bilaterally, no wheezes, rhonchi or rales; normal work of breathing on  room air GI: soft, non-tender, non-distended, bowel sounds present x4, no hepatomegaly, no splenomegaly, no masses GU: external vaginal tissue ***, cervix ***, *** punctate lesions on cervix appreciated, *** discharge from cervical os, *** bleeding, *** cervical motion tenderness, *** abdominal/ adnexal masses Extremities: warm, well perfused, No edema, cyanosis or clubbing; +*** pulses bilaterally MSK: *** gait and *** station Skin: dry; intact; no rashes or lesions Neuro: *** Strength and  light touch sensation grossly intact, *** DTRs ***/4  Assessment/ Plan: 88 y.o. female   Chronic pain syndrome  Stress due to illness of family member  ***   Norene CHRISTELLA Fielding, DO [facility] [phone]

## 2023-12-04 ENCOUNTER — Other Ambulatory Visit: Payer: Self-pay | Admitting: Family Medicine

## 2023-12-06 ENCOUNTER — Ambulatory Visit

## 2023-12-07 ENCOUNTER — Other Ambulatory Visit: Payer: Self-pay | Admitting: Family Medicine

## 2023-12-07 ENCOUNTER — Ambulatory Visit (INDEPENDENT_AMBULATORY_CARE_PROVIDER_SITE_OTHER)

## 2023-12-07 VITALS — BP 140/45 | HR 64 | Ht 63.0 in | Wt 150.0 lb

## 2023-12-07 DIAGNOSIS — Z952 Presence of prosthetic heart valve: Secondary | ICD-10-CM

## 2023-12-07 DIAGNOSIS — Z0000 Encounter for general adult medical examination without abnormal findings: Secondary | ICD-10-CM

## 2023-12-07 DIAGNOSIS — Z9189 Other specified personal risk factors, not elsewhere classified: Secondary | ICD-10-CM

## 2023-12-07 MED ORDER — AMOXICILLIN 500 MG PO CAPS: ORAL_CAPSULE | ORAL | 0 refills | Status: DC

## 2023-12-07 NOTE — Progress Notes (Signed)
 Subjective:   [name] is a 88 y.o. who presents for a Medicare Wellness preventive visit.  As a reminder, Annual Wellness Visits don't include a physical exam, and some assessments may be limited, especially if this visit is performed virtually. We may recommend an in-person follow-up visit with your provider if needed.  Visit Complete: Virtual I connected with  [name] on 12/07/23 by a audio enabled telemedicine application and verified that I am speaking with the correct person using two identifiers.  Patient Location: Home  Provider Location: Home Office  I discussed the limitations of evaluation and management by telemedicine. The patient expressed understanding and agreed to proceed.  Vital Signs: Because this visit was a virtual/telehealth visit, some criteria may be missing or patient reported. Any vitals not documented were not able to be obtained and vitals that have been documented are patient reported.  VideoDeclined- This patient declined Librarian, academic. Therefore the visit was completed with audio only.  Persons Participating in Visit: Patient.  AWV Questionnaire: No: Patient Medicare AWV questionnaire was not completed prior to this visit.  Cardiac Risk Factors include: advanced age (>51men, >77 women);dyslipidemia;hypertension;smoking/ tobacco exposure     Objective:    Today's Vitals   12/07/23 0927  BP: (!) 140/45  Pulse: 64  Weight: 150 lb (68 kg)  Height: 5' 3 (1.6 m)   Body mass index is 26.57 kg/m.     12/07/2023    9:34 AM 06/19/2023    5:02 PM 06/19/2023    1:51 PM 08/18/2022    2:59 PM 11/28/2021    3:38 PM 05/08/2021    7:10 PM 12/08/2018    3:43 PM  Advanced Directives  Does Patient Have a Medical Advance Directive? Yes Yes No No Yes No Yes  Type of Estate agent of [place];Living will Healthcare Power of [place];Living will   Healthcare Power of [place];Living will  Healthcare Power of  [place];Living will  Does patient want to make changes to medical advance directive?  No - Patient declined   No - Patient declined  No - Patient declined  Copy of Healthcare Power of Attorney in Chart? No - copy requested No - copy requested   No - copy requested  No - copy requested  Would patient like information on creating a medical advance directive?    No - Patient declined  Yes (ED - Information included in AVS)     Current Medications (verified) Outpatient Encounter Medications as of 12/07/2023  Medication Sig   acetaminophen  (TYLENOL ) 500 MG tablet Take 1,000 mg by mouth daily as needed for moderate pain or headache.   albuterol  (VENTOLIN  HFA) 108 (90 Base) MCG/ACT inhaler Inhale 2 puffs into the lungs every 6 (six) hours as needed for wheezing or shortness of breath.   amLODipine  (NORVASC ) 2.5 MG tablet Take 1 tablet (2.5 mg total) by mouth daily.   Ascorbic Acid (VITAMIN C) 1000 MG tablet Take 1,000 mg by mouth daily.   aspirin  EC 81 MG tablet Take 1 tablet (81 mg total) by mouth daily.   furosemide  (LASIX ) 20 MG tablet TAKE 1 TABLET BY MOUTH EVERY DAY   Magnesium  500 MG CAPS Take 1 capsule by mouth daily.   metoprolol  tartrate (LOPRESSOR ) 25 MG tablet Take 1 tablet (25 mg total) by mouth 2 (two) times daily.   nabumetone  (RELAFEN ) 500 MG tablet TAKE 2 TABLETS (1,000 MG TOTAL) BY MOUTH 2 (TWO) TIMES DAILY. FOR MUSCLE AND JOINT PAIN  rosuvastatin  (CRESTOR ) 10 MG tablet Take 1 tablet (10 mg total) by mouth daily.   traMADol  (ULTRAM ) 50 MG tablet Take 1 tablet (50 mg total) by mouth every 12 (twelve) hours as needed for severe pain (pain score 7-10).   No facility-administered encounter medications on file as of 12/07/2023.    Allergies (verified) Prolia  [denosumab ] and Tape   History: Past Medical History:  Diagnosis Date   Chronic headaches    Fibromyalgia    GERD (gastroesophageal reflux disease)    Hemorrhoids    INTERNAL--  POST BANDING 02/ 2015   History of kidney  stones    Hyperlipidemia    Hypertension    Moderate aortic stenosis    NSAID long-term use 05/30/2013   OSA (obstructive sleep apnea) CPAP INTOLERANT    Osteoporosis    S/P TAVR (transcatheter aortic valve replacement) 10/19/2017   20 mm Edwards Sapien 3 transcatheter heart valve placed via percutaneous right transfemoral approach    Severe aortic stenosis    Spinal stenosis, multilevel    Past Surgical History:  Procedure Laterality Date   ANTERIOR CERVICAL DECOMP/DISCECTOMY FUSION  11-11-1999   C5 - C6   APPENDECTOMY  AGE 57   CARDIAC CATHETERIZATION  2008  DR NISHAN   ESSENTIALLY NORMAL   CATARACT EXTRACTION Bilateral    CATARACT EXTRACTION W/ INTRAOCULAR LENS  IMPLANT, BILATERAL  2012   EYE SURGERY     FLEXIBLE SIGMOIDOSCOPY N/A 06/01/2013   Procedure: FLEXIBLE SIGMOIDOSCOPY;  Surgeon: Lamar JONETTA Aho, MD;  Location: [facility];  Service: Endoscopy;  Laterality: N/A;  may need hemorrhoidal banding   HEMORRHOIDECTOMY WITH HEMORRHOID BANDING  08-07-2010   IR RADIOLOGY PERIPHERAL GUIDED IV START  06/27/2019   IR US  GUIDE VASC ACCESS RIGHT  06/27/2019   LAPAROSCOPIC CHOLECYSTECTOMY  1990   RIGHT/LEFT HEART CATH AND CORONARY ANGIOGRAPHY N/A 09/29/2017   Procedure: RIGHT/LEFT HEART CATH AND CORONARY ANGIOGRAPHY;  Surgeon: Wonda Sharper, MD;  Location: [facility];  Service: Cardiovascular;  Laterality: N/A;   SHOULDER ARTHROSCOPY WITH OPEN ROTATOR CUFF REPAIR AND DISTAL CLAVICLE ACROMINECTOMY Right 05/25/2012   Procedure: SHOULDER ARTHROSCOPY WITH OPEN ROTATOR CUFF REPAIR AND DISTAL CLAVICLE ACROMINECTOMY;  Surgeon: Lynwood SHAUNNA Bern, MD;  Location: [facility];  Service: Orthopedics;  Laterality: Right;  RIGHT SHOULDER ARTHROSCOPY WITH DERBRIDEMENTOF LABRAL/BICEP TENDON, OPEN DISTAL CLAVICLE RESECTION, ANTERIOR ACROMINECTOMY ROTATOR CUFF REPAIR ANESTHESIA: GENERAL/SCALENE NERVE BLOCK   TEE WITHOUT CARDIOVERSION Bilateral 10/19/2017   Procedure: TRANSESOPHAGEAL  ECHOCARDIOGRAM (TEE);  Surgeon: Wonda Sharper, MD;  Location: [facility] OR;  Service: Open Heart Surgery;  Laterality: Bilateral;   TENOSYNOVECTOMY Left 01/04/2014   Procedure: LEFT WRIST EXTENSOR TENOSYNOVECTOMY;  Surgeon: Prentice LELON Pagan, MD;  Location: [facility];  Service: Orthopedics;  Laterality: Left;   THYROIDECTOMY  AGE 84   GOITER   TONSILLECTOMY AND ADENOIDECTOMY  AGE 13   TRANSCATHETER AORTIC VALVE REPLACEMENT, TRANSFEMORAL  10/19/2017   TRANSCATHETER AORTIC VALVE REPLACEMENT, TRANSFEMORAL Bilateral 10/19/2017   Procedure: TRANSCATHETER AORTIC VALVE REPLACEMENT, TRANSFEMORAL;  Surgeon: Wonda Sharper, MD;  Location: [facility];  Service: Open Heart Surgery;  Laterality: Bilateral;   TRANSTHORACIC ECHOCARDIOGRAM  last one 04-20-2013  DR [facility]    NORMAL LVSF/ EF 60-65%/ MODERATE  AV  STENOSIS WITH NO AR /  MILD LAE   UMBILICAL HERNIA REPAIR  JUNE 2006   VAGINAL HYSTERECTOMY  01-31-2001   ANTERIOR & POSTERIOR REPAIR/ TRANSVAGINAL BLADDER SLING   Family History  Problem Relation Age of Onset   Cirrhosis Mother  drinking   Heart disease Sister    Other Brother        duodenal ulcer   Heart failure Maternal Grandmother    Gallbladder disease Maternal Grandmother    Stomach cancer Paternal Grandfather    Cancer Son 31       colon   Social History   Socioeconomic History   Marital status: Married    Spouse name: Linzy Laury   Number of children: 6   Years of education: 6   Highest education level: 8th grade  Occupational History   Occupation: Retired  Tobacco Use   Smoking status: Former    Current packs/day: 0.00    Average packs/day: 0.3 packs/day for 5.0 years (1.3 ttl pk-yrs)    Types: Cigarettes    Start date: 04/13/1998    Quit date: 04/14/2003    Years since quitting: 20.6    Passive exposure: Past   Smokeless tobacco: Never  Vaping Use   Vaping status: Never Used  Substance and Sexual Activity   Alcohol  use: Yes    Comment: once in a while-social    Drug use: Never    Comment: hemp oil    Sexual activity: Not Currently    Partners: Male  Other Topics Concern   Not on file  Social History Narrative   Not on file   Social Drivers of Health   Financial Resource Strain: Low Risk  (12/07/2023)   Overall Financial Resource Strain (CARDIA)    Difficulty of Paying Living Expenses: Not hard at all  Food Insecurity: No Food Insecurity (12/07/2023)   Hunger Vital Sign    Worried About Running Out of Food in the Last Year: Never true    Ran Out of Food in the Last Year: Never true  Transportation Needs: No Transportation Needs (12/07/2023)   PRAPARE - Administrator, Civil Service (Medical): No    Lack of Transportation (Non-Medical): No  Physical Activity: Inactive (12/07/2023)   Exercise Vital Sign    Days of Exercise per Week: 0 days    Minutes of Exercise per Session: 0 min  Stress: No Stress Concern Present (12/07/2023)   [employer] of Occupational Health - Occupational Stress Questionnaire    Feeling of Stress: Not at all  Social Connections: Moderately Isolated (12/07/2023)   Social Connection and Isolation Panel    Frequency of Communication with Friends and Family: More than three times a week    Frequency of Social Gatherings with Friends and Family: More than three times a week    Attends Religious Services: Never    Database administrator or Organizations: No    Attends Engineer, structural: Never    Marital Status: Married    Tobacco Counseling Counseling given: Yes    Clinical Intake:  Pre-visit preparation completed: Yes  Pain : No/denies pain     BMI - recorded: 26.57 Nutritional Status: BMI 25 -29 Overweight Nutritional Risks: None Diabetes: No  Lab Results  Component Value Date   HGBA1C 5.7 (H) 10/15/2017   HGBA1C 5.8 (H) 08/12/2016     How often do you need to have someone help you when you read instructions, pamphlets, or other written materials from your doctor or  pharmacy?: 1 - Never  Interpreter Needed?: No  Information entered by :: alia t/cma   Activities of Daily Living     12/07/2023    9:33 AM 06/19/2023    5:02 PM  In your present state of health,  do you have any difficulty performing the following activities:  Hearing? 0 0  Vision? 0 0  Difficulty concentrating or making decisions? 0 0  Walking or climbing stairs? 1   Dressing or bathing? 0   Doing errands, shopping? 0 0  Preparing Food and eating ? N   Using the Toilet? N   In the past six months, have you accidently leaked urine? N   Do you have problems with loss of bowel control? N   Managing your Medications? N   Managing your Finances? Y   Housekeeping or managing your Housekeeping? N     Patient Care Team: Jolinda Norene HERO, DO as PCP - General (Family Medicine) Delford Maude BROCKS, MD as PCP - Cardiology (Cardiology) Delford Maude BROCKS, MD as Consulting Physician (Cardiology) Debrah Lamar BIRCH, MD (Inactive) as Consulting Physician (Gastroenterology) [place], Lynwood SQUIBB, MD (Inactive) as Consulting Physician (Orthopedic Surgery) Cyrena Donnice DEL, MD as Referring Physician (Neurology) Roz Anes, MD as Consulting Physician (Ophthalmology)  I have updated your Care Teams any recent Medical Services you may have received from other providers in the past year.     Assessment:   This is a routine wellness examination for [place].  Hearing/Vision screen Hearing Screening - Comments:: Pt denies hearing dif Vision Screening - Comments:: Pt wear reading glasses/pt has upcoming apt   Goals Addressed             This Visit's Progress    Exercise 3x per week (30 min per time)   On track      Depression Screen     12/07/2023    9:38 AM 11/10/2023    2:54 PM 08/10/2023    2:09 PM 07/09/2023    9:13 AM 07/01/2023   11:05 AM 05/13/2023   10:43 AM 01/11/2023    9:40 AM  PHQ 2/9 Scores  PHQ - 2 Score 0 0 0 0  0 0  PHQ- 9 Score 0 0 0 0  2 1  Exception Documentation     Other-  indicate reason in comment box    Not completed     Patient unable to complete form due to ilness      Fall Risk     12/07/2023    9:29 AM 11/10/2023    2:54 PM 08/10/2023    2:09 PM 07/09/2023    9:28 AM 12/28/2022    1:28 PM  Fall Risk   Falls in the past year? 0 0 0 0 0  Number falls in past yr: 0 0 0 0   Injury with Fall? 0 0 0 0   Risk for fall due to : No Fall Risks No Fall Risks No Fall Risks No Fall Risks   Follow up Falls evaluation completed Falls evaluation completed Falls evaluation completed      MEDICARE RISK AT HOME:  Medicare Risk at Home Any stairs in or around the home?: Yes If so, are there any without handrails?: Yes Home free of loose throw rugs in walkways, pet beds, electrical cords, etc?: Yes Adequate lighting in your home to reduce risk of falls?: Yes Life alert?: No Use of a cane, walker or w/c?: No Grab bars in the bathroom?: Yes Shower chair or bench in shower?: Yes Elevated toilet seat or a handicapped toilet?: Yes  TIMED UP AND GO:  Was the test performed?  no  Cognitive Function: 6CIT completed    04/21/2016    3:36 PM 11/08/2014    9:36 AM 07/26/2014  10:37 AM  MMSE - Mini Mental State Exam  Orientation to time 5  5  5    Orientation to Place 5  5  5    Registration 3  3  3    Attention/ Calculation 5  5  5    Recall 3  3  3    Language- name 2 objects 2  2  2    Language- repeat 1 1 1   Language- follow 3 step command 3  3  3    Language- read & follow direction 1  1  1    Write a sentence 1  1  1    Copy design 1  1  1    Total score 30  30  30       Data saved with a previous flowsheet row definition        12/07/2023    9:41 AM 08/18/2022    2:59 PM 12/08/2018    3:44 PM  6CIT Screen  What Year? 0 points 0 points 0 points  What month? 0 points 0 points 0 points  What time? 0 points 0 points 0 points  Count back from 20 0 points 0 points 0 points  Months in reverse 0 points 0 points 0 points  Repeat phrase 0 points 0 points 0 points   Total Score 0 points 0 points 0 points    Immunizations Immunization History  Administered Date(s) Administered   Influenza,inj,Quad PF,6+ Mos 02/01/2013   Tdap 07/17/2018    Screening Tests Health Maintenance  Topic Date Due   COVID-19 Vaccine (1) Never done   Zoster Vaccines- Shingrix (1 of 2) Never done   INFLUENZA VACCINE  11/12/2023   Pneumococcal Vaccine: 50+ Years (1 of 2 - PCV) 07/08/2024 (Originally 09/03/1954)   MAMMOGRAM  07/08/2024 (Originally 09/12/2021)   Medicare Annual Wellness (AWV)  12/06/2024   DTaP/Tdap/Td (2 - Td or Tdap) 07/16/2028   DEXA SCAN  Completed   HPV VACCINES  Aged Out   Meningococcal B Vaccine  Aged Out    Health Maintenance  Health Maintenance Due  Topic Date Due   COVID-19 Vaccine (1) Never done   Zoster Vaccines- Shingrix (1 of 2) Never done   INFLUENZA VACCINE  11/12/2023   Health Maintenance Items Addressed: See Nurse Notes at the end of this note  Additional Screening:  Vision Screening: Recommended annual ophthalmology exams for early detection of glaucoma and other disorders of the eye. Would you like a referral to an eye doctor? No    Dental Screening: Recommended annual dental exams for proper oral hygiene  Community Resource Referral / Chronic Care Management: CRR required this visit?  No   CCM required this visit?  No   Plan:    I have personally reviewed and noted the following in the patient's chart:   Medical and social history Use of alcohol , tobacco or illicit drugs  Current medications and supplements including opioid prescriptions. Patient is not currently taking opioid prescriptions. Functional ability and status Nutritional status Physical activity Advanced directives List of other physicians Hospitalizations, surgeries, and ER visits in previous 12 months Vitals Screenings to include cognitive, depression, and falls Referrals and appointments  In addition, I have reviewed and discussed with  patient certain preventive protocols, quality metrics, and best practice recommendations. A written personalized care plan for preventive services as well as general preventive health recommendations were provided to patient.   Ozie Ned, CMA   12/07/2023   After Visit Summary: (MyChart) Due to this being a telephonic  visit, the after visit summary with patients personalized plan was offered to patient via MyChart   Notes: Nothing significant to report at this time.

## 2023-12-07 NOTE — Patient Instructions (Addendum)
 Ms. Ivanov , Thank you for taking time out of your busy schedule to complete your Annual Wellness Visit with me. I enjoyed our conversation and look forward to speaking with you again next year. I, as well as your care team,  appreciate your ongoing commitment to your health goals. Please review the following plan we discussed and let me know if I can assist you in the future. Your Game plan/ To Do List    Referrals: If you haven't heard from the office you've been referred to, please reach out to them at the phone provided.   Follow up Visits: We will see or speak with you next year for your Next Medicare AWV with our clinical staff on 12/07/24 at 9:20a.m. Have you seen your provider in the last 6 months (3 months if uncontrolled diabetes)? Yes  Clinician Recommendations:  Aim for 30 minutes of exercise or brisk walking, 6-8 glasses of water, and 5 servings of fruits and vegetables each day.       This is a list of the screenings recommended for you:  Health Maintenance  Topic Date Due   COVID-19 Vaccine (1) Never done   Zoster (Shingles) Vaccine (1 of 2) Never done   Medicare Annual Wellness Visit  08/18/2023   Flu Shot  11/12/2023   Pneumococcal Vaccine for age over 53 (1 of 2 - PCV) 07/08/2024*   Mammogram  07/08/2024*   DTaP/Tdap/Td vaccine (2 - Td or Tdap) 07/16/2028   DEXA scan (bone density measurement)  Completed   HPV Vaccine  Aged Out   Meningitis B Vaccine  Aged Out  *Topic was postponed. The date shown is not the original due date.    Advanced directives: (Copy Requested) Please bring a copy of your health care power of attorney and living will to the office to be added to your chart at your convenience. You can mail to [facility] [address] [place], [place] [address] or email to [email or web address] Advance Care Planning is important because it:  [x]  Makes sure you receive the medical care that is consistent with your values, goals, and  preferences  [x]  It provides guidance to your family and loved ones and reduces their decisional burden about whether or not they are making the right decisions based on your wishes.  Follow the link provided in your after visit summary or read over the paperwork we have mailed to you to help you started getting your Advance Directives in place. If you need assistance in completing these, please reach out to us  so that we can help you!  See attachments for Preventive Care and Fall Prevention Tips.

## 2023-12-07 NOTE — Progress Notes (Signed)
 Meds ordered this encounter  Medications   amoxicillin  (AMOXIL ) 500 MG capsule    Sig: Take 4 capsules (2,000 mg total) by mouth once for 1 dose. 1 hour before procedure    Dispense:  4 capsule    Refill:  0

## 2023-12-09 ENCOUNTER — Encounter: Payer: Self-pay | Admitting: Pulmonary Disease

## 2023-12-09 ENCOUNTER — Ambulatory Visit: Admitting: Pulmonary Disease

## 2023-12-09 VITALS — BP 140/54 | HR 58 | Temp 98.5°F | Ht 62.0 in | Wt 150.0 lb

## 2023-12-09 DIAGNOSIS — R911 Solitary pulmonary nodule: Secondary | ICD-10-CM

## 2023-12-09 DIAGNOSIS — J09X1 Influenza due to identified novel influenza A virus with pneumonia: Secondary | ICD-10-CM

## 2023-12-09 DIAGNOSIS — J410 Simple chronic bronchitis: Secondary | ICD-10-CM

## 2023-12-09 NOTE — Progress Notes (Signed)
 [name]    [id number]    1935/06/14  Primary Care Physician:Gottschalk, Norene HERO, DO  Referring Physician: Jolinda Norene HERO, DO [address] [place],  [place] [address]  Chief complaint: Follow-up for lung nodules, chronic bronchitis  HPI: 88 y.o. who  has a past medical history of Chronic headaches, Fibromyalgia, GERD (gastroesophageal reflux disease), Hemorrhoids, History of kidney stones, Hyperlipidemia, Hypertension, Moderate aortic stenosis, NSAID long-term use (05/30/2013), OSA (obstructive sleep apnea) (CPAP INTOLERANT ), Osteoporosis, S/P TAVR (transcatheter aortic valve replacement) (10/19/2017), Severe aortic stenosis, and Spinal stenosis, multilevel.   Discussed the use of AI scribe software for clinical note transcription with the patient, who gave verbal consent to proceed.  The patient, with a history of lung nodules and aortic stenosis, presents with a persistent cough and phlegm production. She reports that these symptoms began late last year, initially diagnosed as bronchitis and treated with Z-Pak, Hycodan, prednisone . Despite this treatment, the symptoms have persisted, coming and going intermittently. She has been managing her symptoms with home remedies, including oregano oil, red onion with honey, and a tea made with turmeric and lemon. She reports that these remedies have been effective in managing her symptoms, particularly the oregano oil, which she takes at the onset of an 'attack.' She has a history of three instances of walking pneumonia and wonders if this could be related to her current symptoms. She also has a history of smoking, although she reports that she did not inhale and quit approximately 30 years ago.   Interim history: Discussed the use of AI scribe software for clinical note transcription with the patient, who gave verbal consent to proceed.  History of Present Illness [name] is an 88 year old female with chronic bronchitis who  presents for follow-up of lung nodules.  Pulmonary symptoms and lung nodules - Chronic bronchitis with lung nodules under surveillance - Hospitalized in March 2025 for pneumonia and influenza type A, required oxygen support and treatment with azithromycin , ceftriaxone , and Tamiflu  - Significant improvement since discharge with no recurrence of cough or phlegm - No current cough, hemoptysis, fatigue, weight loss, or loss of appetite - Does not use inhalers regularly but has albuterol  inhaler available as needed - Attributes recovery to oregano oil and deep breathing exercises  Cardiac history and symptoms - History of heart murmur - Undergoes cardiology follow-up every 6 to 12 months - History of transcatheter aortic valve replacement (TAVR) - No current cardiac symptoms reported   Relevant pulmonary history Pets: 2 dogs 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  No feather pillows or comforters Smoking history: 4 to 5 cigarettes/day for about 5 years.  Quit at the age of 57 Travel history: Previously lived in [place] in [place] .  No significant recent travel history Relevant family history: No family history of lung disease   Outpatient Encounter Medications as of 12/09/2023  Medication Sig   acetaminophen  (TYLENOL ) 500 MG tablet Take 1,000 mg by mouth daily as needed for moderate pain or headache.   albuterol  (VENTOLIN  HFA) 108 (90 Base) MCG/ACT inhaler Inhale 2 puffs into the lungs every 6 (six) hours as needed for wheezing or shortness of breath.   amLODipine  (NORVASC ) 2.5 MG tablet Take 1 tablet (2.5 mg total) by mouth daily.   Ascorbic Acid (VITAMIN C) 1000 MG tablet Take 1,000 mg by mouth daily.   aspirin  EC 81 MG tablet Take 1 tablet (81 mg total) by  mouth daily.   furosemide  (LASIX ) 20 MG tablet TAKE 1 TABLET BY MOUTH EVERY DAY   Magnesium  500 MG CAPS Take 1 capsule by mouth daily.   metoprolol  tartrate (LOPRESSOR ) 25 MG tablet Take 1  tablet (25 mg total) by mouth 2 (two) times daily.   rosuvastatin  (CRESTOR ) 10 MG tablet Take 1 tablet (10 mg total) by mouth daily.   traMADol  (ULTRAM ) 50 MG tablet Take 1 tablet (50 mg total) by mouth every 12 (twelve) hours as needed for severe pain (pain score 7-10).   amoxicillin  (AMOXIL ) 500 MG capsule Take 4 capsules (2,000 mg total) by mouth once for 1 dose. 1 hour before procedure (Patient not taking: Reported on 12/09/2023)   nabumetone  (RELAFEN ) 500 MG tablet TAKE 2 TABLETS (1,000 MG TOTAL) BY MOUTH 2 (TWO) TIMES DAILY. FOR MUSCLE AND JOINT PAIN   No facility-administered encounter medications on file as of 12/09/2023.    Allergies as of 12/09/2023 - Review Complete 12/09/2023  Allergen Reaction Noted   Prolia  [denosumab ] Other (See Comments) 08/05/2017   Tape Rash and Other (See Comments) 09/07/2012   Vitals:   12/09/23 1351  BP: (!) 140/54  Pulse: (!) 58  Temp: 98.5 F (36.9 C)  Height: 5' 2 (1.575 m)  Weight: 150 lb (68 kg)  SpO2: 97%  TempSrc: Oral  BMI (Calculated): 27.43    Physical Exam GEN: No acute distress. CV: Regular rate and rhythm with ejection heart murmur present. LUNGS: Clear to auscultation bilaterally, normal respiratory effort. SKIN JOINTS: Warm and dry, no rash.  Data Reviewed: Imaging: CT chest 03/16/2023- Right upper lobe and basal pulmonary nodule measuring 7 mm.  Stable since 2019 CT chest 06/20/2023-extensive patchy centrilobular nodularity, tree-in-bud opacity in the lower lobes bilaterally. Images reviewed.  PFTs: 10/11/2017 FVC 2.28 [96%], FEV1 1.82 (103%], F/F80, TLC 5.84 [122%], DLCO 16.37 []  Mild obstructive airway disease  Labs:  Assessment & Plan Stable benign lung nodules Lung nodules have been stable since 2019 and are likely benign. Given the stability and her history of smoking cessation, the likelihood of malignancy is very low. - No further CT scans needed as nodules are unchanged and she is asymptomatic. - Discussed  symptoms of potential malignancy, including cough, hemoptysis, fatigue, weight loss, and loss of appetite.  Chronic bronchitis She reports no symptoms such as cough or phlegm and is not using inhalers. - Albuterol  inhaler is available but not used.  History of resolved pneumonia and influenza A (hospitalized March 2025) Hospitalized in March 2025 for pneumonia and influenza A, treated with azithromycin , ceftriaxone , and Tamiflu . Currently asymptomatic and feeling well. - No follow-up imaging needed due to concern from family about radiation exposure. - Advise annual flu vaccination to prevent future influenza infections. - Advise hand hygiene and avoiding large crowds during flu season.  Obstructive sleep apnea She required BiPAP during the hospitalization.  Does not want to continue NIV at home.  Aortic Stenosis History of aortic stenosis with surgical intervention and residual leakage. No current complaints related to this condition. -No specific plan needed at this time, continue current management.  Arthritis Chronic condition, patient remains active to manage symptoms. -Continue current activity level and management strategies.   Recommendations: Follow-up as needed  Daksha Koone MD [facility] 12/09/2023, 2:07 PM  CC: Jolinda Norene HERO, DO

## 2023-12-09 NOTE — Patient Instructions (Signed)
  VISIT SUMMARY: Today, we reviewed your chronic bronchitis and lung nodules, your cardiac history, and your history of osteoporosis. We also discussed your past hospitalization for pneumonia and influenza A, and your preventive measures against infections. Overall, you are doing well with no current symptoms of concern.  YOUR PLAN: -STABLE BENIGN LUNG NODULES: Your lung nodules have been stable since 2019 and are likely not cancerous. Since they have not changed and you have no symptoms, no further CT scans are needed. We discussed symptoms to watch for, such as cough, coughing up blood, fatigue, weight loss, and loss of appetite.  -CHRONIC BRONCHITIS: Chronic bronchitis is a long-term inflammation of the airways in the lungs. You are currently not experiencing symptoms like cough or phlegm and are not using your inhalers. You have an albuterol  inhaler available if needed.  -HISTORY OF RESOLVED PNEUMONIA AND INFLUENZA A: You were hospitalized in March 2025 for pneumonia and influenza A and received treatment. You are now feeling well with no symptoms. To prevent future infections, you should get an annual flu shot, practice good hand hygiene, and avoid large crowds during flu season.  -OBSTRUCTIVE SLEEP APNEA: Obstructive sleep apnea is a condition where your breathing stops and starts during sleep. It was managed with PIPAP during your hospitalization, but you do not use PIPAP at home.  INSTRUCTIONS: Please continue with your regular follow-ups with your cardiologist every 6 to 12 months. Make sure to get your annual flu vaccination and practice good hand hygiene. If you experience any new symptoms such as cough, coughing up blood, fatigue, weight loss, or loss of appetite, please contact our office.

## 2024-01-18 ENCOUNTER — Other Ambulatory Visit: Payer: Self-pay

## 2024-01-20 ENCOUNTER — Other Ambulatory Visit: Payer: Self-pay

## 2024-01-20 MED ORDER — METOPROLOL TARTRATE 25 MG PO TABS: 25.0000 mg | ORAL_TABLET | Freq: Two times a day (BID) | ORAL | 0 refills | Status: AC

## 2024-01-20 MED ORDER — METOPROLOL TARTRATE 25 MG PO TABS: 25.0000 mg | ORAL_TABLET | Freq: Two times a day (BID) | ORAL | 3 refills | Status: DC

## 2024-02-01 ENCOUNTER — Telehealth: Payer: Self-pay | Admitting: Family Medicine

## 2024-02-01 DIAGNOSIS — Z952 Presence of prosthetic heart valve: Secondary | ICD-10-CM

## 2024-02-01 DIAGNOSIS — Z0120 Encounter for dental examination and cleaning without abnormal findings: Secondary | ICD-10-CM

## 2024-02-01 NOTE — Telephone Encounter (Signed)
 Per telephone encounter, asking for more than 4 pills, have several dental appointments

## 2024-02-01 NOTE — Telephone Encounter (Unsigned)
 Copied from CRM #[id number]. Topic: Clinical - Medication Refill >> Feb 01, 2024  1:02 PM Delon T wrote: Medication: amoxicillin  (AMOXIL ) 500 MG capsule- asking for more than 4 pills, have several dental appointments  Has the patient contacted their pharmacy? No (Agent: If no, request that the patient contact the pharmacy for the refill. If patient does not wish to contact the pharmacy document the reason why and proceed with request.) (Agent: If yes, when and what did the pharmacy advise?)  This is the patient's preferred pharmacy:  [id number] - [place], [place] - [address] [place] [address] Phone: [phone] Fax: [phone]  Is this the correct pharmacy for this prescription? Yes If no, delete pharmacy and type the correct one.   Has the prescription been filled recently? Yes  Is the patient out of the medication? Yes  Has the patient been seen for an appointment in the last year OR does the patient have an upcoming appointment? Yes  Can we respond through MyChart? Yes  Agent: Please be advised that Rx refills may take up to 3 business days. We ask that you follow-up with your pharmacy.

## 2024-02-02 ENCOUNTER — Telehealth: Payer: Self-pay | Admitting: Family Medicine

## 2024-02-02 MED ORDER — AMOXICILLIN 500 MG PO CAPS: ORAL_CAPSULE | ORAL | 2 refills | Status: DC

## 2024-02-02 NOTE — Telephone Encounter (Signed)
Message already in 

## 2024-02-02 NOTE — Telephone Encounter (Signed)
 Patient has dental appt 10-27 and needs some Amoxicillin  500 mg called in . Would like more than four pills. She has to have four teeth pulled and don't want to call every time she has the dental work. Please call patient.

## 2024-02-11 ENCOUNTER — Encounter: Payer: Self-pay | Admitting: Family Medicine

## 2024-02-11 ENCOUNTER — Ambulatory Visit (INDEPENDENT_AMBULATORY_CARE_PROVIDER_SITE_OTHER): Admitting: Family Medicine

## 2024-02-11 VITALS — BP 139/46 | HR 60 | Temp 98.1°F | Ht 62.0 in | Wt 153.1 lb

## 2024-02-11 DIAGNOSIS — M130 Polyarthritis, unspecified: Secondary | ICD-10-CM | POA: Diagnosis not present

## 2024-02-11 DIAGNOSIS — G894 Chronic pain syndrome: Secondary | ICD-10-CM | POA: Diagnosis not present

## 2024-02-11 DIAGNOSIS — L309 Dermatitis, unspecified: Secondary | ICD-10-CM

## 2024-02-11 DIAGNOSIS — Z952 Presence of prosthetic heart valve: Secondary | ICD-10-CM | POA: Diagnosis not present

## 2024-02-11 DIAGNOSIS — Z0120 Encounter for dental examination and cleaning without abnormal findings: Secondary | ICD-10-CM

## 2024-02-11 DIAGNOSIS — M797 Fibromyalgia: Secondary | ICD-10-CM | POA: Diagnosis not present

## 2024-02-11 MED ORDER — TRAMADOL HCL 50 MG PO TABS: 50.0000 mg | ORAL_TABLET | Freq: Two times a day (BID) | ORAL | 5 refills | Status: AC | PRN

## 2024-02-11 MED ORDER — TRIAMCINOLONE ACETONIDE 0.1 % EX CREA: 1.0000 | TOPICAL_CREAM | Freq: Two times a day (BID) | CUTANEOUS | 0 refills | Status: AC

## 2024-02-11 MED ORDER — AMOXICILLIN 500 MG PO CAPS: ORAL_CAPSULE | ORAL | 0 refills | Status: AC

## 2024-02-11 NOTE — Progress Notes (Signed)
 Subjective: CC: Follow-up pain management PCP: Jolinda Norene HERO, DO HPI:[name] is a 88 y.o. female presenting to clinic today for:  Polyarthralgia compounded with fibromyalgia Patient suffers from osteoarthritis compounded with fibromyalgia.  She was placed on tramadol  up to twice daily as needed last visit and notes that she has only taken maybe 10 of the 60 that she filled back in July.  She uses this very sparingly and only when symptoms are refractory to other modalities of treatment.  She denies any excessive daytime sedation, heart palpitations, changes in bowel habits  Needs refills on her dental prophylactic amoxicillin  as she plans for at least 6 more dental procedures and has a heart valve so needs prophylaxis  She reports an itchy rash along the right lower back that is refractory to OTC management.   ROS: Per HPI  Allergies  Allergen Reactions   Prolia  [Denosumab ] Other (See Comments)    Arthralgia/myalgia/jaw pain/headache   Tape Rash and Other (See Comments)    Dermatitis rash with extended exposure   Past Medical History:  Diagnosis Date   Chronic headaches    Fibromyalgia    GERD (gastroesophageal reflux disease)    GI bleed 05/30/2013   Hemorrhoids    INTERNAL--  POST BANDING 02/ 2015   History of kidney stones    Hyperlipidemia    Hypertension    Moderate aortic stenosis    NSAID long-term use 05/30/2013   OSA (obstructive sleep apnea) CPAP INTOLERANT    Osteoporosis    S/P TAVR (transcatheter aortic valve replacement) 10/19/2017   20 mm Edwards Sapien 3 transcatheter heart valve placed via percutaneous right transfemoral approach    Severe aortic stenosis    Spinal stenosis, multilevel     Current Outpatient Medications:    acetaminophen  (TYLENOL ) 500 MG tablet, Take 1,000 mg by mouth daily as needed for moderate pain or headache., Disp: , Rfl:    albuterol  (VENTOLIN  HFA) 108 (90 Base) MCG/ACT inhaler, Inhale 2 puffs into the lungs every 6  (six) hours as needed for wheezing or shortness of breath., Disp: 8 g, Rfl: 2   amLODipine  (NORVASC ) 2.5 MG tablet, Take 1 tablet (2.5 mg total) by mouth daily., Disp: 90 tablet, Rfl: 3   amoxicillin  (AMOXIL ) 500 MG capsule, Take 4 capsules (2,000 mg total) by mouth once for 1 dose. 1 hour before procedure, Disp: 4 capsule, Rfl: 2   Ascorbic Acid (VITAMIN C) 1000 MG tablet, Take 1,000 mg by mouth daily., Disp: , Rfl:    aspirin  EC 81 MG tablet, Take 1 tablet (81 mg total) by mouth daily., Disp: 90 tablet, Rfl: 3   furosemide  (LASIX ) 20 MG tablet, TAKE 1 TABLET BY MOUTH EVERY DAY, Disp: 90 tablet, Rfl: 3   Magnesium  500 MG CAPS, Take 1 capsule by mouth daily., Disp: , Rfl:    metoprolol  tartrate (LOPRESSOR ) 25 MG tablet, Take 1 tablet (25 mg total) by mouth 2 (two) times daily., Disp: 180 tablet, Rfl: 0   nabumetone  (RELAFEN ) 500 MG tablet, TAKE 2 TABLETS (1,000 MG TOTAL) BY MOUTH 2 (TWO) TIMES DAILY. FOR MUSCLE AND JOINT PAIN, Disp: 360 tablet, Rfl: 0   rosuvastatin  (CRESTOR ) 10 MG tablet, Take 1 tablet (10 mg total) by mouth daily., Disp: 90 tablet, Rfl: 2   traMADol  (ULTRAM ) 50 MG tablet, Take 1 tablet (50 mg total) by mouth every 12 (twelve) hours as needed for severe pain (pain score 7-10)., Disp: 60 tablet, Rfl: 5 Social History   Socioeconomic History  Marital status: Married    Spouse name: Grier Czerwinski   Number of children: 6   Years of education: 6   Highest education level: 8th grade  Occupational History   Occupation: Retired  Tobacco Use   Smoking status: Former    Current packs/day: 0.00    Average packs/day: 0.3 packs/day for 5.0 years (1.3 ttl pk-yrs)    Types: Cigarettes    Start date: 04/13/1998    Quit date: 04/14/2003    Years since quitting: 20.8    Passive exposure: Past   Smokeless tobacco: Never  Vaping Use   Vaping status: Never Used  Substance and Sexual Activity   Alcohol  use: Yes    Comment: once in a while-social   Drug use: Never    Comment: hemp oil     Sexual activity: Not Currently    Partners: Male  Other Topics Concern   Not on file  Social History Narrative   Not on file   Social Drivers of Health   Financial Resource Strain: Low Risk  (12/07/2023)   Overall Financial Resource Strain (CARDIA)    Difficulty of Paying Living Expenses: Not hard at all  Food Insecurity: No Food Insecurity (12/07/2023)   Hunger Vital Sign    Worried About Running Out of Food in the Last Year: Never true    Ran Out of Food in the Last Year: Never true  Transportation Needs: No Transportation Needs (12/07/2023)   PRAPARE - Administrator, Civil Service (Medical): No    Lack of Transportation (Non-Medical): No  Physical Activity: Inactive (12/07/2023)   Exercise Vital Sign    Days of Exercise per Week: 0 days    Minutes of Exercise per Session: 0 min  Stress: No Stress Concern Present (12/07/2023)   [employer] of Occupational Health - Occupational Stress Questionnaire    Feeling of Stress: Not at all  Social Connections: Moderately Isolated (12/07/2023)   Social Connection and Isolation Panel    Frequency of Communication with Friends and Family: More than three times a week    Frequency of Social Gatherings with Friends and Family: More than three times a week    Attends Religious Services: Never    Database Administrator or Organizations: No    Attends Banker Meetings: Never    Marital Status: Married  Catering Manager Violence: Not At Risk (12/07/2023)   Humiliation, Afraid, Rape, and Kick questionnaire    Fear of Current or Ex-Partner: No    Emotionally Abused: No    Physically Abused: No    Sexually Abused: No   Family History  Problem Relation Age of Onset   Cirrhosis Mother        drinking   Heart disease Sister    Other Brother        duodenal ulcer   Heart failure Maternal Grandmother    Gallbladder disease Maternal Grandmother    Stomach cancer Paternal Grandfather    Cancer Son 13       colon     Objective: Office vital signs reviewed. BP (!) 139/46   Pulse 60   Temp 98.1 F (36.7 C)   Ht 5' 2 (1.575 m)   Wt 153 lb 2 oz (69.5 kg)   SpO2 97%   BMI 28.01 kg/m   Physical Examination:  General: Awake, alert, well nourished, No acute distress HEENT: Sclera white.  Moist mucous membranes Cardio: regular rate and rhythm, S1S2 heard, + murmurs appreciated Pulm:  clear to auscultation bilaterally, no wheezes, rhonchi or rales; normal work of breathing on room air MSK: Ambulating independently.  Osteoarthritic changes noted through the hands bilaterally with deformity noted in the right pinky Skin: Small patch of excoriated skin noted along the right lower back.  No evidence of secondary infection or vesicular formations  Assessment/ Plan: 88 y.o. female   Chronic pain syndrome - Plan: traMADol  (ULTRAM ) 50 MG tablet  Polyarthritis - Plan: traMADol  (ULTRAM ) 50 MG tablet  Fibromyalgia - Plan: traMADol  (ULTRAM ) 50 MG tablet  S/P TAVR (transcatheter aortic valve replacement) - Plan: amoxicillin  (AMOXIL ) 500 MG capsule  Need for dental care - Plan: amoxicillin  (AMOXIL ) 500 MG capsule  Dermatitis - Plan: triamcinolone  cream (KENALOG ) 0.1 %   Medication renewed.  She is up-to-date on UDS and CSC and the national narcotic database reviewed.  She still having quite a bit of breakthrough pain and I think it is because she is so reluctant to utilize medication.  We discussed trying to at least incorporate it once daily most days per week to see if this alleviates some of her discomfort and improves her mobility and quality of life.   Amoxicillin  renewed  Dermatitis to be treated with topical triamcinolone .  Norene CHRISTELLA Fielding, DO [facility] [phone]

## 2024-06-09 ENCOUNTER — Ambulatory Visit: Admitting: Family Medicine

## 2024-12-07 ENCOUNTER — Ambulatory Visit: Payer: Self-pay
